# Patient Record
Sex: Female | Born: 1935
Health system: Southern US, Community
[De-identification: ages and names within clinical notes are randomized; demographics above are authoritative.]

## PROBLEM LIST (undated history)

## (undated) DIAGNOSIS — H3530 Unspecified macular degeneration: Secondary | ICD-10-CM

## (undated) DIAGNOSIS — Z7901 Long term (current) use of anticoagulants: Secondary | ICD-10-CM

## (undated) DIAGNOSIS — D759 Disease of blood and blood-forming organs, unspecified: Secondary | ICD-10-CM

## (undated) DIAGNOSIS — I340 Nonrheumatic mitral (valve) insufficiency: Secondary | ICD-10-CM

## (undated) DIAGNOSIS — R011 Cardiac murmur, unspecified: Secondary | ICD-10-CM

## (undated) DIAGNOSIS — E669 Obesity, unspecified: Secondary | ICD-10-CM

## (undated) DIAGNOSIS — M26629 Arthralgia of temporomandibular joint, unspecified side: Secondary | ICD-10-CM

## (undated) DIAGNOSIS — D689 Coagulation defect, unspecified: Secondary | ICD-10-CM

## (undated) DIAGNOSIS — I471 Supraventricular tachycardia: Secondary | ICD-10-CM

## (undated) DIAGNOSIS — Z86718 Personal history of other venous thrombosis and embolism: Secondary | ICD-10-CM

## (undated) DIAGNOSIS — I10 Essential (primary) hypertension: Secondary | ICD-10-CM

## (undated) DIAGNOSIS — Z85820 Personal history of malignant melanoma of skin: Secondary | ICD-10-CM

## (undated) DIAGNOSIS — E119 Type 2 diabetes mellitus without complications: Secondary | ICD-10-CM

## (undated) DIAGNOSIS — M1990 Unspecified osteoarthritis, unspecified site: Secondary | ICD-10-CM

## (undated) DIAGNOSIS — Z860100 Personal history of colon polyps, unspecified: Secondary | ICD-10-CM

## (undated) DIAGNOSIS — N201 Calculus of ureter: Secondary | ICD-10-CM

## (undated) DIAGNOSIS — A498 Other bacterial infections of unspecified site: Secondary | ICD-10-CM

## (undated) DIAGNOSIS — N200 Calculus of kidney: Secondary | ICD-10-CM

## (undated) DIAGNOSIS — I7121 Aneurysm of the ascending aorta, without rupture: Secondary | ICD-10-CM

## (undated) DIAGNOSIS — Z862 Personal history of diseases of the blood and blood-forming organs and certain disorders involving the immune mechanism: Secondary | ICD-10-CM

## (undated) DIAGNOSIS — E782 Mixed hyperlipidemia: Secondary | ICD-10-CM

## (undated) DIAGNOSIS — R739 Hyperglycemia, unspecified: Secondary | ICD-10-CM

## (undated) DIAGNOSIS — E1169 Type 2 diabetes mellitus with other specified complication: Secondary | ICD-10-CM

## (undated) DIAGNOSIS — F411 Generalized anxiety disorder: Secondary | ICD-10-CM

## (undated) DIAGNOSIS — M545 Low back pain: Secondary | ICD-10-CM

## (undated) DIAGNOSIS — Z8619 Personal history of other infectious and parasitic diseases: Secondary | ICD-10-CM

## (undated) DIAGNOSIS — Z9889 Other specified postprocedural states: Secondary | ICD-10-CM

## (undated) DIAGNOSIS — Z8679 Personal history of other diseases of the circulatory system: Secondary | ICD-10-CM

## (undated) DIAGNOSIS — C50919 Malignant neoplasm of unspecified site of unspecified female breast: Secondary | ICD-10-CM

## (undated) DIAGNOSIS — Z87442 Personal history of urinary calculi: Secondary | ICD-10-CM

## (undated) DIAGNOSIS — E785 Hyperlipidemia, unspecified: Secondary | ICD-10-CM

## (undated) DIAGNOSIS — R351 Nocturia: Secondary | ICD-10-CM

## (undated) DIAGNOSIS — I712 Thoracic aortic aneurysm, without rupture: Secondary | ICD-10-CM

## (undated) DIAGNOSIS — D682 Hereditary deficiency of other clotting factors: Secondary | ICD-10-CM

## (undated) DIAGNOSIS — C4441 Basal cell carcinoma of skin of scalp and neck: Secondary | ICD-10-CM

## (undated) DIAGNOSIS — Z85828 Personal history of other malignant neoplasm of skin: Secondary | ICD-10-CM

## (undated) DIAGNOSIS — I82409 Acute embolism and thrombosis of unspecified deep veins of unspecified lower extremity: Secondary | ICD-10-CM

## (undated) HISTORY — PX: BREAST LUMPECTOMY WITH RADIOACTIVE SEED AND SENTINEL LYMPH NODE BIOPSY: SHX6550

## (undated) HISTORY — PX: CYSTOSCOPY/URETEROSCOPY/HOLMIUM LASER/STENT PLACEMENT: SHX6546

## (undated) HISTORY — PX: MELANOMA EXCISION: SHX5266

## (undated) HISTORY — DX: Unspecified osteoarthritis, unspecified site: M19.90

## (undated) HISTORY — PX: LAPAROSCOPIC HYSTERECTOMY: SHX1926

## (undated) HISTORY — DX: Supraventricular tachycardia: I47.1

## (undated) HISTORY — DX: Hereditary deficiency of other clotting factors: D68.2

## (undated) HISTORY — DX: Essential (primary) hypertension: I10

## (undated) HISTORY — DX: Arthralgia of temporomandibular joint, unspecified side: M26.629

## (undated) HISTORY — DX: Coagulation defect, unspecified: D68.9

## (undated) HISTORY — DX: Malignant neoplasm of unspecified site of unspecified female breast: C50.919

## (undated) HISTORY — DX: Hyperglycemia, unspecified: R73.9

## (undated) HISTORY — PX: SPLENECTOMY: SUR1306

## (undated) HISTORY — DX: Other bacterial infections of unspecified site: A49.8

## (undated) HISTORY — DX: Nocturia: R35.1

## (undated) HISTORY — PX: JOINT REPLACEMENT: SHX530

## (undated) HISTORY — PX: COLONOSCOPY: SHX174

## (undated) HISTORY — DX: Basal cell carcinoma of skin of scalp and neck: C44.41

## (undated) HISTORY — DX: Personal history of colonic polyps: Z86.010

## (undated) HISTORY — DX: Nonrheumatic mitral (valve) insufficiency: I34.0

## (undated) HISTORY — PX: OTHER SURGICAL HISTORY: SHX169

## (undated) HISTORY — DX: Type 2 diabetes mellitus with other specified complication: E11.69

## (undated) HISTORY — DX: Acute embolism and thrombosis of unspecified deep veins of unspecified lower extremity: I82.409

## (undated) HISTORY — PX: UPPER GASTROINTESTINAL ENDOSCOPY: SHX188

## (undated) HISTORY — DX: Hyperlipidemia, unspecified: E78.5

## (undated) HISTORY — DX: Obesity, unspecified: E66.9

## (undated) HISTORY — DX: Mixed hyperlipidemia: E78.2

## (undated) HISTORY — PX: ABDOMINAL HYSTERECTOMY: SHX81

## (undated) HISTORY — PX: EYE SURGERY: SHX253

## (undated) HISTORY — DX: Personal history of colon polyps, unspecified: Z86.0100

## (undated) HISTORY — DX: Thoracic aortic aneurysm, without rupture: I71.2

## (undated) HISTORY — DX: Unspecified macular degeneration: H35.30

## (undated) HISTORY — DX: Generalized anxiety disorder: F41.1

## (undated) HISTORY — DX: Low back pain: M54.5

---
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
## 1971-01-31 HISTORY — PX: ABDOMINAL HYSTERECTOMY: SHX81

## 1996-01-31 DIAGNOSIS — D682 Hereditary deficiency of other clotting factors: Secondary | ICD-10-CM

## 1996-01-31 HISTORY — PX: SPLENECTOMY, TOTAL: SHX788

## 1996-01-31 HISTORY — DX: Hereditary deficiency of other clotting factors: D68.2

## 1997-01-30 DIAGNOSIS — Z8739 Personal history of other diseases of the musculoskeletal system and connective tissue: Secondary | ICD-10-CM

## 1997-01-30 HISTORY — PX: TOTAL HIP ARTHROPLASTY: SHX124

## 1997-01-30 HISTORY — DX: Personal history of other diseases of the musculoskeletal system and connective tissue: Z87.39

## 1997-06-17 ENCOUNTER — Inpatient Hospital Stay (HOSPITAL_COMMUNITY): Admission: RE | Admit: 1997-06-17 | Discharge: 1997-06-23 | Payer: Self-pay | Admitting: Orthopaedic Surgery

## 1997-06-23 ENCOUNTER — Inpatient Hospital Stay (HOSPITAL_COMMUNITY)
Admission: RE | Admit: 1997-06-23 | Discharge: 1997-06-26 | Payer: Self-pay | Admitting: Physical Medicine and Rehabilitation

## 1997-09-10 ENCOUNTER — Inpatient Hospital Stay (HOSPITAL_COMMUNITY): Admission: RE | Admit: 1997-09-10 | Discharge: 1997-09-11 | Payer: Self-pay | Admitting: Internal Medicine

## 1998-02-24 ENCOUNTER — Emergency Department (HOSPITAL_COMMUNITY): Admission: EM | Admit: 1998-02-24 | Discharge: 1998-02-24 | Payer: Self-pay | Admitting: Emergency Medicine

## 1998-02-25 ENCOUNTER — Encounter: Payer: Self-pay | Admitting: Emergency Medicine

## 1998-02-26 ENCOUNTER — Ambulatory Visit (HOSPITAL_COMMUNITY): Admission: RE | Admit: 1998-02-26 | Discharge: 1998-02-26 | Payer: Self-pay | Admitting: Internal Medicine

## 1998-02-26 ENCOUNTER — Encounter: Payer: Self-pay | Admitting: Internal Medicine

## 1998-05-14 ENCOUNTER — Encounter: Payer: Self-pay | Admitting: Gynecology

## 1998-05-14 ENCOUNTER — Ambulatory Visit (HOSPITAL_COMMUNITY): Admission: RE | Admit: 1998-05-14 | Discharge: 1998-05-14 | Payer: Self-pay | Admitting: Gynecology

## 1998-07-11 ENCOUNTER — Emergency Department (HOSPITAL_COMMUNITY): Admission: EM | Admit: 1998-07-11 | Discharge: 1998-07-11 | Payer: Self-pay | Admitting: Emergency Medicine

## 1998-12-09 ENCOUNTER — Encounter: Payer: Self-pay | Admitting: Obstetrics & Gynecology

## 1998-12-09 ENCOUNTER — Ambulatory Visit (HOSPITAL_COMMUNITY): Admission: RE | Admit: 1998-12-09 | Discharge: 1998-12-09 | Payer: Self-pay | Admitting: Obstetrics & Gynecology

## 1999-01-31 HISTORY — PX: CATARACT EXTRACTION W/ INTRAOCULAR LENS  IMPLANT, BILATERAL: SHX1307

## 1999-12-12 ENCOUNTER — Encounter: Payer: Self-pay | Admitting: Obstetrics & Gynecology

## 1999-12-12 ENCOUNTER — Ambulatory Visit (HOSPITAL_COMMUNITY): Admission: RE | Admit: 1999-12-12 | Discharge: 1999-12-12 | Payer: Self-pay | Admitting: Obstetrics & Gynecology

## 2000-07-11 ENCOUNTER — Other Ambulatory Visit: Admission: RE | Admit: 2000-07-11 | Discharge: 2000-07-11 | Payer: Self-pay | Admitting: Obstetrics & Gynecology

## 2000-12-12 ENCOUNTER — Encounter: Payer: Self-pay | Admitting: Obstetrics & Gynecology

## 2000-12-12 ENCOUNTER — Ambulatory Visit (HOSPITAL_COMMUNITY): Admission: RE | Admit: 2000-12-12 | Discharge: 2000-12-12 | Payer: Self-pay | Admitting: Obstetrics & Gynecology

## 2001-05-24 ENCOUNTER — Encounter (INDEPENDENT_AMBULATORY_CARE_PROVIDER_SITE_OTHER): Payer: Self-pay | Admitting: Gastroenterology

## 2001-12-09 ENCOUNTER — Ambulatory Visit (HOSPITAL_COMMUNITY): Admission: RE | Admit: 2001-12-09 | Discharge: 2001-12-09 | Payer: Self-pay | Admitting: Obstetrics & Gynecology

## 2001-12-09 ENCOUNTER — Encounter: Payer: Self-pay | Admitting: Obstetrics & Gynecology

## 2002-12-15 ENCOUNTER — Ambulatory Visit (HOSPITAL_COMMUNITY): Admission: RE | Admit: 2002-12-15 | Discharge: 2002-12-15 | Payer: Self-pay | Admitting: Obstetrics & Gynecology

## 2003-12-09 ENCOUNTER — Ambulatory Visit: Payer: Self-pay | Admitting: Internal Medicine

## 2003-12-16 ENCOUNTER — Ambulatory Visit (HOSPITAL_COMMUNITY): Admission: RE | Admit: 2003-12-16 | Discharge: 2003-12-16 | Payer: Self-pay | Admitting: Obstetrics & Gynecology

## 2004-02-05 ENCOUNTER — Ambulatory Visit: Payer: Self-pay | Admitting: Internal Medicine

## 2004-02-23 ENCOUNTER — Ambulatory Visit: Payer: Self-pay | Admitting: Internal Medicine

## 2004-04-26 ENCOUNTER — Ambulatory Visit: Payer: Self-pay | Admitting: Internal Medicine

## 2004-04-30 DIAGNOSIS — Z86711 Personal history of pulmonary embolism: Secondary | ICD-10-CM

## 2004-04-30 HISTORY — DX: Personal history of pulmonary embolism: Z86.711

## 2004-05-10 ENCOUNTER — Ambulatory Visit: Payer: Self-pay

## 2004-05-10 ENCOUNTER — Inpatient Hospital Stay (HOSPITAL_COMMUNITY): Admission: EM | Admit: 2004-05-10 | Discharge: 2004-05-13 | Payer: Self-pay | Admitting: Internal Medicine

## 2004-05-11 ENCOUNTER — Ambulatory Visit: Payer: Self-pay | Admitting: Internal Medicine

## 2004-05-16 ENCOUNTER — Ambulatory Visit: Payer: Self-pay | Admitting: Internal Medicine

## 2004-05-23 ENCOUNTER — Ambulatory Visit: Payer: Self-pay | Admitting: Cardiology

## 2004-05-30 ENCOUNTER — Ambulatory Visit: Payer: Self-pay | Admitting: Cardiology

## 2004-06-06 ENCOUNTER — Ambulatory Visit: Payer: Self-pay | Admitting: *Deleted

## 2004-06-08 ENCOUNTER — Ambulatory Visit: Payer: Self-pay | Admitting: Internal Medicine

## 2004-06-10 ENCOUNTER — Ambulatory Visit: Payer: Self-pay | Admitting: Internal Medicine

## 2004-06-20 ENCOUNTER — Ambulatory Visit: Payer: Self-pay | Admitting: Internal Medicine

## 2004-06-22 ENCOUNTER — Ambulatory Visit (HOSPITAL_COMMUNITY): Admission: RE | Admit: 2004-06-22 | Discharge: 2004-06-22 | Payer: Self-pay | Admitting: Internal Medicine

## 2004-06-22 ENCOUNTER — Ambulatory Visit: Payer: Self-pay | Admitting: Internal Medicine

## 2004-06-23 ENCOUNTER — Ambulatory Visit: Payer: Self-pay | Admitting: Internal Medicine

## 2004-06-23 ENCOUNTER — Ambulatory Visit: Payer: Self-pay | Admitting: Oncology

## 2004-06-28 ENCOUNTER — Ambulatory Visit: Payer: Self-pay | Admitting: Internal Medicine

## 2004-07-05 ENCOUNTER — Ambulatory Visit: Payer: Self-pay | Admitting: Internal Medicine

## 2004-07-18 ENCOUNTER — Ambulatory Visit: Payer: Self-pay | Admitting: Cardiology

## 2004-07-29 ENCOUNTER — Ambulatory Visit: Payer: Self-pay | Admitting: Internal Medicine

## 2004-08-03 ENCOUNTER — Ambulatory Visit: Payer: Self-pay | Admitting: Internal Medicine

## 2004-08-15 ENCOUNTER — Ambulatory Visit: Payer: Self-pay | Admitting: Cardiology

## 2004-08-23 ENCOUNTER — Ambulatory Visit: Payer: Self-pay | Admitting: Cardiology

## 2004-09-13 ENCOUNTER — Ambulatory Visit: Payer: Self-pay | Admitting: Cardiology

## 2004-09-15 ENCOUNTER — Ambulatory Visit: Payer: Self-pay | Admitting: Internal Medicine

## 2004-09-27 ENCOUNTER — Ambulatory Visit: Payer: Self-pay | Admitting: Cardiology

## 2004-10-04 ENCOUNTER — Ambulatory Visit: Payer: Self-pay | Admitting: Internal Medicine

## 2004-10-10 ENCOUNTER — Ambulatory Visit: Payer: Self-pay | Admitting: Internal Medicine

## 2004-10-18 ENCOUNTER — Ambulatory Visit: Payer: Self-pay | Admitting: Internal Medicine

## 2004-11-15 ENCOUNTER — Ambulatory Visit: Payer: Self-pay | Admitting: Cardiology

## 2004-11-17 ENCOUNTER — Ambulatory Visit: Payer: Self-pay | Admitting: Internal Medicine

## 2004-11-21 ENCOUNTER — Ambulatory Visit: Payer: Self-pay | Admitting: Internal Medicine

## 2004-12-06 ENCOUNTER — Ambulatory Visit: Payer: Self-pay | Admitting: Cardiology

## 2004-12-16 ENCOUNTER — Ambulatory Visit (HOSPITAL_COMMUNITY): Admission: RE | Admit: 2004-12-16 | Discharge: 2004-12-16 | Payer: Self-pay | Admitting: Obstetrics & Gynecology

## 2004-12-20 ENCOUNTER — Ambulatory Visit: Payer: Self-pay | Admitting: Cardiology

## 2004-12-29 ENCOUNTER — Ambulatory Visit: Payer: Self-pay | Admitting: Internal Medicine

## 2005-01-02 ENCOUNTER — Ambulatory Visit: Payer: Self-pay | Admitting: Internal Medicine

## 2005-01-17 ENCOUNTER — Ambulatory Visit: Payer: Self-pay | Admitting: Cardiology

## 2005-02-07 ENCOUNTER — Ambulatory Visit: Payer: Self-pay | Admitting: *Deleted

## 2005-02-21 ENCOUNTER — Ambulatory Visit: Payer: Self-pay | Admitting: Cardiology

## 2005-02-28 ENCOUNTER — Ambulatory Visit: Payer: Self-pay | Admitting: Internal Medicine

## 2005-02-28 ENCOUNTER — Ambulatory Visit: Payer: Self-pay | Admitting: Cardiology

## 2005-03-14 ENCOUNTER — Ambulatory Visit: Payer: Self-pay | Admitting: Cardiology

## 2005-03-25 ENCOUNTER — Ambulatory Visit: Payer: Self-pay | Admitting: Family Medicine

## 2005-03-28 ENCOUNTER — Ambulatory Visit: Payer: Self-pay | Admitting: Internal Medicine

## 2005-04-04 ENCOUNTER — Ambulatory Visit: Payer: Self-pay | Admitting: *Deleted

## 2005-04-04 ENCOUNTER — Ambulatory Visit: Payer: Self-pay | Admitting: Internal Medicine

## 2005-04-18 ENCOUNTER — Ambulatory Visit: Payer: Self-pay | Admitting: Internal Medicine

## 2005-05-18 ENCOUNTER — Ambulatory Visit: Payer: Self-pay | Admitting: Cardiology

## 2005-06-15 ENCOUNTER — Ambulatory Visit: Payer: Self-pay | Admitting: Cardiology

## 2005-07-04 ENCOUNTER — Ambulatory Visit: Payer: Self-pay | Admitting: Internal Medicine

## 2005-07-14 ENCOUNTER — Ambulatory Visit: Payer: Self-pay | Admitting: Cardiology

## 2005-07-24 ENCOUNTER — Ambulatory Visit: Payer: Self-pay | Admitting: Internal Medicine

## 2005-07-25 ENCOUNTER — Ambulatory Visit: Payer: Self-pay | Admitting: Cardiovascular Disease

## 2005-08-15 ENCOUNTER — Ambulatory Visit: Payer: Self-pay | Admitting: Cardiovascular Disease

## 2005-08-23 ENCOUNTER — Ambulatory Visit: Payer: Self-pay | Admitting: Internal Medicine

## 2005-08-31 ENCOUNTER — Ambulatory Visit: Payer: Self-pay | Admitting: Cardiology

## 2005-09-19 ENCOUNTER — Ambulatory Visit: Payer: Self-pay | Admitting: Internal Medicine

## 2005-09-28 ENCOUNTER — Ambulatory Visit: Payer: Self-pay | Admitting: Cardiology

## 2005-10-11 ENCOUNTER — Ambulatory Visit: Payer: Self-pay | Admitting: Cardiovascular Disease

## 2005-10-25 ENCOUNTER — Ambulatory Visit: Payer: Self-pay | Admitting: Internal Medicine

## 2005-10-25 ENCOUNTER — Ambulatory Visit: Payer: Self-pay | Admitting: Cardiology

## 2005-11-08 ENCOUNTER — Ambulatory Visit: Payer: Self-pay | Admitting: Cardiology

## 2005-11-08 ENCOUNTER — Ambulatory Visit: Payer: Self-pay | Admitting: Internal Medicine

## 2005-11-29 ENCOUNTER — Ambulatory Visit: Payer: Self-pay | Admitting: Cardiology

## 2005-12-11 ENCOUNTER — Ambulatory Visit: Payer: Self-pay | Admitting: Internal Medicine

## 2005-12-13 ENCOUNTER — Ambulatory Visit: Payer: Self-pay | Admitting: Cardiology

## 2005-12-25 ENCOUNTER — Ambulatory Visit (HOSPITAL_COMMUNITY): Admission: RE | Admit: 2005-12-25 | Discharge: 2005-12-25 | Payer: Self-pay | Admitting: Obstetrics & Gynecology

## 2005-12-26 ENCOUNTER — Ambulatory Visit: Payer: Self-pay | Admitting: Cardiology

## 2006-01-12 ENCOUNTER — Ambulatory Visit: Payer: Self-pay | Admitting: Cardiology

## 2006-01-31 ENCOUNTER — Ambulatory Visit: Payer: Self-pay | Admitting: *Deleted

## 2006-02-28 ENCOUNTER — Ambulatory Visit: Payer: Self-pay | Admitting: Internal Medicine

## 2006-03-20 ENCOUNTER — Ambulatory Visit: Payer: Self-pay | Admitting: Cardiology

## 2006-04-17 ENCOUNTER — Ambulatory Visit: Payer: Self-pay | Admitting: Cardiology

## 2006-05-03 ENCOUNTER — Ambulatory Visit: Payer: Self-pay | Admitting: Internal Medicine

## 2006-05-03 LAB — CONVERTED CEMR LAB
ALT: 15 units/L (ref 0–40)
AST: 22 units/L (ref 0–37)
Albumin: 3.8 g/dL (ref 3.5–5.2)
Alkaline Phosphatase: 40 units/L (ref 39–117)
Bilirubin, Direct: 0.1 mg/dL (ref 0.0–0.3)
Cholesterol: 165 mg/dL (ref 0–200)
HDL: 45.8 mg/dL (ref >39.0)
LDL Cholesterol: 100 mg/dL — ABNORMAL HIGH (ref 0–99)
Total Bilirubin: 0.7 mg/dL (ref 0.3–1.2)
Total CHOL/HDL Ratio: 3.6
Total Protein: 7.3 g/dL (ref 6.0–8.3)
Triglycerides: 96 mg/dL (ref 0–149)
VLDL: 19 mg/dL (ref 0–40)

## 2006-05-07 ENCOUNTER — Ambulatory Visit: Payer: Self-pay | Admitting: Internal Medicine

## 2006-05-15 ENCOUNTER — Ambulatory Visit: Payer: Self-pay | Admitting: Cardiovascular Disease

## 2006-06-12 ENCOUNTER — Ambulatory Visit: Payer: Self-pay | Admitting: Cardiology

## 2006-07-10 ENCOUNTER — Ambulatory Visit: Payer: Self-pay | Admitting: Cardiology

## 2006-07-23 ENCOUNTER — Ambulatory Visit: Payer: Self-pay | Admitting: Cardiovascular Disease

## 2006-08-11 DIAGNOSIS — Z86718 Personal history of other venous thrombosis and embolism: Secondary | ICD-10-CM | POA: Insufficient documentation

## 2006-08-20 ENCOUNTER — Ambulatory Visit: Payer: Self-pay | Admitting: Cardiology

## 2006-09-04 ENCOUNTER — Ambulatory Visit: Payer: Self-pay | Admitting: Cardiology

## 2006-09-21 ENCOUNTER — Ambulatory Visit: Payer: Self-pay | Admitting: Cardiovascular Disease

## 2006-10-05 ENCOUNTER — Ambulatory Visit: Payer: Self-pay | Admitting: Internal Medicine

## 2006-10-19 ENCOUNTER — Ambulatory Visit: Payer: Self-pay | Admitting: Cardiology

## 2006-11-09 ENCOUNTER — Ambulatory Visit: Payer: Self-pay | Admitting: Cardiology

## 2006-11-21 ENCOUNTER — Ambulatory Visit: Payer: Self-pay | Admitting: Cardiology

## 2006-11-23 ENCOUNTER — Encounter: Payer: Self-pay | Admitting: Internal Medicine

## 2006-11-23 ENCOUNTER — Ambulatory Visit: Payer: Self-pay | Admitting: Internal Medicine

## 2006-11-25 DIAGNOSIS — I712 Thoracic aortic aneurysm, without rupture, unspecified: Secondary | ICD-10-CM | POA: Insufficient documentation

## 2006-12-05 ENCOUNTER — Ambulatory Visit: Payer: Self-pay | Admitting: Cardiology

## 2006-12-15 ENCOUNTER — Ambulatory Visit: Payer: Self-pay | Admitting: Family Medicine

## 2006-12-19 ENCOUNTER — Ambulatory Visit: Payer: Self-pay | Admitting: Cardiology

## 2006-12-31 ENCOUNTER — Ambulatory Visit (HOSPITAL_COMMUNITY): Admission: RE | Admit: 2006-12-31 | Discharge: 2006-12-31 | Payer: Self-pay | Admitting: Internal Medicine

## 2006-12-31 ENCOUNTER — Ambulatory Visit: Payer: Self-pay | Admitting: Internal Medicine

## 2007-01-03 ENCOUNTER — Encounter: Payer: Self-pay | Admitting: Internal Medicine

## 2007-01-03 ENCOUNTER — Encounter: Admission: RE | Admit: 2007-01-03 | Discharge: 2007-01-03 | Payer: Self-pay | Admitting: Internal Medicine

## 2007-01-21 ENCOUNTER — Ambulatory Visit: Payer: Self-pay | Admitting: Cardiology

## 2007-02-18 ENCOUNTER — Ambulatory Visit: Payer: Self-pay | Admitting: Cardiology

## 2007-03-04 ENCOUNTER — Ambulatory Visit: Payer: Self-pay | Admitting: Internal Medicine

## 2007-03-28 ENCOUNTER — Telehealth: Payer: Self-pay | Admitting: Internal Medicine

## 2007-03-28 ENCOUNTER — Ambulatory Visit: Payer: Self-pay | Admitting: Internal Medicine

## 2007-03-28 DIAGNOSIS — I471 Supraventricular tachycardia, unspecified: Secondary | ICD-10-CM | POA: Insufficient documentation

## 2007-04-02 ENCOUNTER — Ambulatory Visit: Payer: Self-pay | Admitting: Internal Medicine

## 2007-04-04 ENCOUNTER — Telehealth: Payer: Self-pay | Admitting: Internal Medicine

## 2007-04-04 ENCOUNTER — Ambulatory Visit: Payer: Self-pay | Admitting: Cardiovascular Disease

## 2007-04-05 ENCOUNTER — Ambulatory Visit: Payer: Self-pay | Admitting: Internal Medicine

## 2007-04-05 LAB — CONVERTED CEMR LAB
BUN: 30 mg/dL — ABNORMAL HIGH (ref 6–23)
Creatinine, Ser: 0.9 mg/dL (ref 0.4–1.2)

## 2007-04-08 ENCOUNTER — Encounter: Admission: RE | Admit: 2007-04-08 | Discharge: 2007-04-08 | Payer: Self-pay | Admitting: Internal Medicine

## 2007-04-14 ENCOUNTER — Encounter: Payer: Self-pay | Admitting: Internal Medicine

## 2007-04-18 ENCOUNTER — Ambulatory Visit: Payer: Self-pay | Admitting: Cardiology

## 2007-04-23 ENCOUNTER — Encounter: Payer: Self-pay | Admitting: Internal Medicine

## 2007-04-25 ENCOUNTER — Telehealth: Payer: Self-pay | Admitting: Internal Medicine

## 2007-04-30 ENCOUNTER — Telehealth: Payer: Self-pay | Admitting: Internal Medicine

## 2007-05-02 ENCOUNTER — Ambulatory Visit: Payer: Self-pay | Admitting: Internal Medicine

## 2007-05-16 ENCOUNTER — Ambulatory Visit: Payer: Self-pay | Admitting: Cardiology

## 2007-06-04 ENCOUNTER — Ambulatory Visit: Payer: Self-pay | Admitting: Internal Medicine

## 2007-06-04 ENCOUNTER — Ambulatory Visit: Payer: Self-pay | Admitting: Cardiology

## 2007-06-04 DIAGNOSIS — E785 Hyperlipidemia, unspecified: Secondary | ICD-10-CM | POA: Insufficient documentation

## 2007-06-04 LAB — CONVERTED CEMR LAB
ALT: 16 units/L (ref 0–35)
AST: 25 units/L (ref 0–37)
Albumin: 3.9 g/dL (ref 3.5–5.2)
Alkaline Phosphatase: 47 units/L (ref 39–117)
BUN: 21 mg/dL (ref 6–23)
Bilirubin, Direct: 0.1 mg/dL (ref 0.0–0.3)
CO2: 31 meq/L (ref 19–32)
Calcium: 9.6 mg/dL (ref 8.4–10.5)
Chloride: 106 meq/L (ref 96–112)
Cholesterol: 177 mg/dL (ref 0–200)
Creatinine, Ser: 0.9 mg/dL (ref 0.4–1.2)
GFR calc Af Amer: 79 mL/min
GFR calc non Af Amer: 66 mL/min
Glucose, Bld: 116 mg/dL — ABNORMAL HIGH (ref 70–99)
HDL: 43.1 mg/dL (ref >39.0)
LDL Cholesterol: 110 mg/dL — ABNORMAL HIGH (ref 0–99)
Potassium: 4.5 meq/L (ref 3.5–5.1)
Sodium: 143 meq/L (ref 135–145)
TSH: 3.27 microintl units/mL (ref 0.35–5.50)
Total Bilirubin: 0.8 mg/dL (ref 0.3–1.2)
Total CHOL/HDL Ratio: 4.1
Total Protein: 7.7 g/dL (ref 6.0–8.3)
Triglycerides: 120 mg/dL (ref 0–149)
VLDL: 24 mg/dL (ref 0–40)

## 2007-06-07 ENCOUNTER — Encounter: Payer: Self-pay | Admitting: Internal Medicine

## 2007-06-25 ENCOUNTER — Ambulatory Visit: Payer: Self-pay | Admitting: Cardiology

## 2007-07-02 ENCOUNTER — Ambulatory Visit: Payer: Self-pay | Admitting: Internal Medicine

## 2007-07-02 DIAGNOSIS — I872 Venous insufficiency (chronic) (peripheral): Secondary | ICD-10-CM | POA: Insufficient documentation

## 2007-07-17 ENCOUNTER — Ambulatory Visit: Payer: Self-pay | Admitting: Internal Medicine

## 2007-07-29 ENCOUNTER — Telehealth: Payer: Self-pay | Admitting: Internal Medicine

## 2007-07-30 ENCOUNTER — Ambulatory Visit: Payer: Self-pay | Admitting: Internal Medicine

## 2007-08-14 ENCOUNTER — Ambulatory Visit: Payer: Self-pay | Admitting: Cardiology

## 2007-09-04 ENCOUNTER — Ambulatory Visit: Payer: Self-pay | Admitting: Cardiology

## 2007-09-16 ENCOUNTER — Telehealth: Payer: Self-pay | Admitting: Internal Medicine

## 2007-09-18 ENCOUNTER — Ambulatory Visit: Payer: Self-pay | Admitting: Internal Medicine

## 2007-09-18 DIAGNOSIS — I10 Essential (primary) hypertension: Secondary | ICD-10-CM | POA: Insufficient documentation

## 2007-09-25 ENCOUNTER — Ambulatory Visit: Payer: Self-pay | Admitting: Internal Medicine

## 2007-09-25 DIAGNOSIS — M26629 Arthralgia of temporomandibular joint, unspecified side: Secondary | ICD-10-CM | POA: Insufficient documentation

## 2007-10-02 ENCOUNTER — Ambulatory Visit: Payer: Self-pay | Admitting: Internal Medicine

## 2007-10-28 ENCOUNTER — Ambulatory Visit: Payer: Self-pay | Admitting: Cardiovascular Disease

## 2007-10-28 ENCOUNTER — Ambulatory Visit: Payer: Self-pay | Admitting: Internal Medicine

## 2007-11-12 ENCOUNTER — Telehealth: Payer: Self-pay | Admitting: Gastroenterology

## 2007-11-19 ENCOUNTER — Telehealth: Payer: Self-pay | Admitting: Internal Medicine

## 2007-11-22 ENCOUNTER — Ambulatory Visit: Payer: Self-pay | Admitting: Internal Medicine

## 2007-11-25 ENCOUNTER — Ambulatory Visit: Payer: Self-pay | Admitting: Cardiology

## 2007-11-27 ENCOUNTER — Ambulatory Visit: Payer: Self-pay | Admitting: Family Medicine

## 2007-11-27 ENCOUNTER — Encounter: Payer: Self-pay | Admitting: Internal Medicine

## 2007-12-09 DIAGNOSIS — K649 Unspecified hemorrhoids: Secondary | ICD-10-CM | POA: Insufficient documentation

## 2007-12-09 DIAGNOSIS — Z860100 Personal history of colon polyps, unspecified: Secondary | ICD-10-CM | POA: Insufficient documentation

## 2007-12-10 ENCOUNTER — Ambulatory Visit: Payer: Self-pay | Admitting: Gastroenterology

## 2007-12-11 ENCOUNTER — Ambulatory Visit: Payer: Self-pay | Admitting: Gastroenterology

## 2007-12-11 ENCOUNTER — Ambulatory Visit: Payer: Self-pay | Admitting: Internal Medicine

## 2007-12-11 ENCOUNTER — Telehealth: Payer: Self-pay | Admitting: Gastroenterology

## 2007-12-11 ENCOUNTER — Ambulatory Visit: Payer: Self-pay

## 2007-12-11 DIAGNOSIS — I8000 Phlebitis and thrombophlebitis of superficial vessels of unspecified lower extremity: Secondary | ICD-10-CM | POA: Insufficient documentation

## 2007-12-11 DIAGNOSIS — I8002 Phlebitis and thrombophlebitis of superficial vessels of left lower extremity: Secondary | ICD-10-CM | POA: Insufficient documentation

## 2007-12-11 LAB — CONVERTED CEMR LAB: Fecal Occult Bld: POSITIVE — AB

## 2007-12-16 ENCOUNTER — Telehealth: Payer: Self-pay | Admitting: Gastroenterology

## 2007-12-18 ENCOUNTER — Ambulatory Visit: Payer: Self-pay | Admitting: Gastroenterology

## 2007-12-23 ENCOUNTER — Ambulatory Visit: Payer: Self-pay | Admitting: Internal Medicine

## 2007-12-25 ENCOUNTER — Ambulatory Visit: Payer: Self-pay | Admitting: Gastroenterology

## 2007-12-30 ENCOUNTER — Ambulatory Visit: Payer: Self-pay | Admitting: Cardiovascular Disease

## 2008-01-07 ENCOUNTER — Ambulatory Visit: Payer: Self-pay | Admitting: Cardiology

## 2008-01-08 ENCOUNTER — Ambulatory Visit: Payer: Self-pay | Admitting: Gastroenterology

## 2008-01-08 DIAGNOSIS — K2900 Acute gastritis without bleeding: Secondary | ICD-10-CM | POA: Insufficient documentation

## 2008-01-08 LAB — CONVERTED CEMR LAB: UREASE: NEGATIVE

## 2008-01-17 ENCOUNTER — Ambulatory Visit: Payer: Self-pay | Admitting: Cardiovascular Disease

## 2008-01-30 ENCOUNTER — Ambulatory Visit: Payer: Self-pay | Admitting: Internal Medicine

## 2008-01-31 DIAGNOSIS — Z853 Personal history of malignant neoplasm of breast: Secondary | ICD-10-CM

## 2008-01-31 DIAGNOSIS — C50919 Malignant neoplasm of unspecified site of unspecified female breast: Secondary | ICD-10-CM

## 2008-01-31 DIAGNOSIS — Z923 Personal history of irradiation: Secondary | ICD-10-CM

## 2008-01-31 HISTORY — DX: Personal history of irradiation: Z92.3

## 2008-01-31 HISTORY — PX: BREAST LUMPECTOMY: SHX2

## 2008-01-31 HISTORY — DX: Malignant neoplasm of unspecified site of unspecified female breast: C50.919

## 2008-01-31 HISTORY — DX: Personal history of malignant neoplasm of breast: Z85.3

## 2008-02-04 ENCOUNTER — Ambulatory Visit: Payer: Self-pay | Admitting: Gastroenterology

## 2008-02-04 LAB — CONVERTED CEMR LAB
Ferritin: 34.1 ng/mL (ref 10.0–291.0)
Folate: 15.9 ng/mL
Iron: 81 ug/dL (ref 42–145)
Saturation Ratios: 23.4 % (ref 20.0–50.0)
Transferrin: 246.9 mg/dL (ref >=212.0)
Vitamin B-12: 1114 pg/mL — ABNORMAL HIGH (ref 211–911)

## 2008-02-11 ENCOUNTER — Telehealth: Payer: Self-pay | Admitting: Gastroenterology

## 2008-02-13 ENCOUNTER — Ambulatory Visit: Payer: Self-pay | Admitting: Cardiovascular Disease

## 2008-02-14 ENCOUNTER — Ambulatory Visit: Payer: Self-pay | Admitting: Gastroenterology

## 2008-02-14 LAB — CONVERTED CEMR LAB
Fecal Occult Blood: NEGATIVE
OCCULT 1: NEGATIVE
OCCULT 2: NEGATIVE
OCCULT 3: NEGATIVE
OCCULT 4: NEGATIVE
OCCULT 5: NEGATIVE

## 2008-03-03 ENCOUNTER — Ambulatory Visit: Payer: Self-pay | Admitting: Cardiology

## 2008-03-25 ENCOUNTER — Ambulatory Visit: Payer: Self-pay | Admitting: Cardiology

## 2008-04-01 ENCOUNTER — Encounter: Payer: Self-pay | Admitting: Internal Medicine

## 2008-04-01 ENCOUNTER — Ambulatory Visit: Payer: Self-pay | Admitting: Internal Medicine

## 2008-04-01 ENCOUNTER — Ambulatory Visit: Payer: Self-pay

## 2008-04-28 ENCOUNTER — Ambulatory Visit: Payer: Self-pay | Admitting: Cardiology

## 2008-05-19 ENCOUNTER — Ambulatory Visit: Payer: Self-pay | Admitting: Cardiology

## 2008-06-09 ENCOUNTER — Ambulatory Visit: Payer: Self-pay | Admitting: Cardiology

## 2008-06-23 ENCOUNTER — Ambulatory Visit: Payer: Self-pay | Admitting: Cardiology

## 2008-06-30 ENCOUNTER — Encounter: Payer: Self-pay | Admitting: *Deleted

## 2008-07-08 ENCOUNTER — Encounter (INDEPENDENT_AMBULATORY_CARE_PROVIDER_SITE_OTHER): Payer: Self-pay | Admitting: Diagnostic Radiology

## 2008-07-08 ENCOUNTER — Encounter: Admission: RE | Admit: 2008-07-08 | Discharge: 2008-07-08 | Payer: Self-pay | Admitting: Obstetrics & Gynecology

## 2008-07-13 ENCOUNTER — Encounter: Payer: Self-pay | Admitting: Cardiology

## 2008-07-14 ENCOUNTER — Ambulatory Visit: Payer: Self-pay | Admitting: Internal Medicine

## 2008-07-14 LAB — CONVERTED CEMR LAB
POC INR: 3.6
Protime: 23.1

## 2008-07-17 ENCOUNTER — Encounter: Admission: RE | Admit: 2008-07-17 | Discharge: 2008-07-17 | Payer: Self-pay | Admitting: Obstetrics & Gynecology

## 2008-07-27 ENCOUNTER — Encounter (INDEPENDENT_AMBULATORY_CARE_PROVIDER_SITE_OTHER): Payer: Self-pay | Admitting: Cardiology

## 2008-07-27 ENCOUNTER — Ambulatory Visit: Payer: Self-pay | Admitting: Cardiology

## 2008-07-27 LAB — CONVERTED CEMR LAB
POC INR: 1.6
Prothrombin Time: 15.4 s

## 2008-07-30 ENCOUNTER — Encounter (INDEPENDENT_AMBULATORY_CARE_PROVIDER_SITE_OTHER): Payer: Self-pay | Admitting: Surgery

## 2008-07-30 ENCOUNTER — Inpatient Hospital Stay (HOSPITAL_COMMUNITY): Admission: RE | Admit: 2008-07-30 | Discharge: 2008-07-31 | Payer: Self-pay | Admitting: Surgery

## 2008-07-30 ENCOUNTER — Encounter: Admission: RE | Admit: 2008-07-30 | Discharge: 2008-07-30 | Payer: Self-pay | Admitting: Surgery

## 2008-07-31 ENCOUNTER — Encounter (INDEPENDENT_AMBULATORY_CARE_PROVIDER_SITE_OTHER): Payer: Self-pay | Admitting: *Deleted

## 2008-07-31 ENCOUNTER — Encounter: Payer: Self-pay | Admitting: Cardiovascular Disease

## 2008-07-31 ENCOUNTER — Encounter (INDEPENDENT_AMBULATORY_CARE_PROVIDER_SITE_OTHER): Payer: Self-pay | Admitting: Cardiology

## 2008-08-04 ENCOUNTER — Ambulatory Visit: Payer: Self-pay | Admitting: Cardiology

## 2008-08-04 LAB — CONVERTED CEMR LAB
POC INR: 1.4
Prothrombin Time: 14.1 s

## 2008-08-05 ENCOUNTER — Encounter: Payer: Self-pay | Admitting: *Deleted

## 2008-08-07 ENCOUNTER — Encounter: Payer: Self-pay | Admitting: Internal Medicine

## 2008-08-10 ENCOUNTER — Encounter (INDEPENDENT_AMBULATORY_CARE_PROVIDER_SITE_OTHER): Payer: Self-pay | Admitting: Cardiology

## 2008-08-10 ENCOUNTER — Ambulatory Visit: Payer: Self-pay | Admitting: Cardiology

## 2008-08-10 ENCOUNTER — Ambulatory Visit: Payer: Self-pay | Admitting: Oncology

## 2008-08-10 HISTORY — PX: BREAST LUMPECTOMY WITH RADIOACTIVE SEED AND SENTINEL LYMPH NODE BIOPSY: SHX6550

## 2008-08-10 LAB — CONVERTED CEMR LAB
POC INR: 2.4
Prothrombin Time: 18.7 s

## 2008-08-12 ENCOUNTER — Encounter: Payer: Self-pay | Admitting: Internal Medicine

## 2008-08-12 LAB — CBC WITH DIFFERENTIAL/PLATELET
BASO%: 1 % (ref 0.0–2.0)
Basophils Absolute: 0.1 10*3/uL (ref 0.0–0.1)
EOS%: 3.9 % (ref 0.0–7.0)
Eosinophils Absolute: 0.3 10*3/uL (ref 0.0–0.5)
HCT: 37.5 % (ref 34.8–46.6)
HGB: 12.7 g/dL (ref 11.6–15.9)
LYMPH%: 31.9 % (ref 14.0–49.7)
MCH: 31.8 pg (ref 25.1–34.0)
MCHC: 33.9 g/dL (ref 31.5–36.0)
MCV: 93.7 fL (ref 79.5–101.0)
MONO#: 1 10*3/uL — ABNORMAL HIGH (ref 0.1–0.9)
MONO%: 15.5 % — ABNORMAL HIGH (ref 0.0–14.0)
NEUT#: 3.2 10*3/uL (ref 1.5–6.5)
NEUT%: 47.7 % (ref 38.4–76.8)
Platelets: 340 10*3/uL (ref 145–400)
RBC: 4 10*6/uL (ref 3.70–5.45)
RDW: 14.1 % (ref 11.2–14.5)
WBC: 6.7 10*3/uL (ref 3.9–10.3)
lymph#: 2.1 10*3/uL (ref 0.9–3.3)

## 2008-08-13 LAB — LACTATE DEHYDROGENASE: LDH: 133 U/L (ref 94–250)

## 2008-08-13 LAB — VITAMIN D 25 HYDROXY (VIT D DEFICIENCY, FRACTURES): Vit D, 25-Hydroxy: 30 ng/mL (ref 30–89)

## 2008-08-13 LAB — CEA: CEA: 1.2 ng/mL (ref 0.0–5.0)

## 2008-08-13 LAB — CANCER ANTIGEN 27.29: CA 27.29: 18 U/mL (ref 0–39)

## 2008-08-17 ENCOUNTER — Ambulatory Visit: Admission: RE | Admit: 2008-08-17 | Discharge: 2008-10-09 | Payer: Self-pay | Admitting: Radiation Oncology

## 2008-08-21 ENCOUNTER — Encounter: Payer: Self-pay | Admitting: Internal Medicine

## 2008-09-07 ENCOUNTER — Ambulatory Visit: Payer: Self-pay | Admitting: Internal Medicine

## 2008-09-07 LAB — CONVERTED CEMR LAB
POC INR: 4.7
Prothrombin Time: 26.4 s

## 2008-09-10 ENCOUNTER — Encounter: Payer: Self-pay | Admitting: Internal Medicine

## 2008-09-21 ENCOUNTER — Ambulatory Visit: Payer: Self-pay | Admitting: Cardiology

## 2008-09-21 LAB — CONVERTED CEMR LAB: POC INR: 3.7

## 2008-10-08 ENCOUNTER — Encounter: Payer: Self-pay | Admitting: Internal Medicine

## 2008-10-13 ENCOUNTER — Ambulatory Visit: Payer: Self-pay | Admitting: Cardiology

## 2008-10-13 LAB — CONVERTED CEMR LAB: POC INR: 2.5

## 2008-10-26 ENCOUNTER — Encounter: Payer: Self-pay | Admitting: Internal Medicine

## 2008-10-26 ENCOUNTER — Telehealth: Payer: Self-pay | Admitting: Internal Medicine

## 2008-11-03 ENCOUNTER — Ambulatory Visit: Payer: Self-pay | Admitting: Oncology

## 2008-11-05 ENCOUNTER — Encounter: Payer: Self-pay | Admitting: Gastroenterology

## 2008-11-10 ENCOUNTER — Ambulatory Visit: Payer: Self-pay | Admitting: Cardiology

## 2008-11-10 LAB — CONVERTED CEMR LAB: POC INR: 2.3

## 2008-11-23 ENCOUNTER — Ambulatory Visit: Payer: Self-pay | Admitting: Internal Medicine

## 2008-11-25 ENCOUNTER — Telehealth: Payer: Self-pay | Admitting: Internal Medicine

## 2008-12-08 ENCOUNTER — Ambulatory Visit: Payer: Self-pay | Admitting: Cardiology

## 2008-12-08 LAB — CONVERTED CEMR LAB: POC INR: 2.3

## 2008-12-11 ENCOUNTER — Telehealth: Payer: Self-pay | Admitting: Internal Medicine

## 2008-12-17 ENCOUNTER — Telehealth: Payer: Self-pay | Admitting: Internal Medicine

## 2008-12-31 ENCOUNTER — Ambulatory Visit: Payer: Self-pay | Admitting: Oncology

## 2008-12-31 ENCOUNTER — Telehealth: Payer: Self-pay | Admitting: Internal Medicine

## 2008-12-31 ENCOUNTER — Encounter: Payer: Self-pay | Admitting: Internal Medicine

## 2009-01-04 ENCOUNTER — Ambulatory Visit: Payer: Self-pay | Admitting: Cardiology

## 2009-01-04 ENCOUNTER — Encounter: Payer: Self-pay | Admitting: Internal Medicine

## 2009-01-04 LAB — CONVERTED CEMR LAB: POC INR: 2.3

## 2009-02-01 ENCOUNTER — Encounter (INDEPENDENT_AMBULATORY_CARE_PROVIDER_SITE_OTHER): Payer: Self-pay | Admitting: Cardiology

## 2009-02-01 ENCOUNTER — Ambulatory Visit: Payer: Self-pay | Admitting: Cardiology

## 2009-02-01 LAB — CONVERTED CEMR LAB: POC INR: 2.5

## 2009-02-03 ENCOUNTER — Encounter: Payer: Self-pay | Admitting: Internal Medicine

## 2009-03-01 ENCOUNTER — Ambulatory Visit: Payer: Self-pay | Admitting: Cardiology

## 2009-03-01 LAB — CONVERTED CEMR LAB: POC INR: 2.3

## 2009-03-29 ENCOUNTER — Ambulatory Visit: Payer: Self-pay | Admitting: Cardiology

## 2009-03-29 LAB — CONVERTED CEMR LAB: POC INR: 1.9

## 2009-04-09 ENCOUNTER — Telehealth: Payer: Self-pay | Admitting: Internal Medicine

## 2009-04-26 ENCOUNTER — Ambulatory Visit: Payer: Self-pay | Admitting: Cardiovascular Disease

## 2009-04-26 ENCOUNTER — Encounter: Payer: Self-pay | Admitting: Internal Medicine

## 2009-04-26 LAB — CONVERTED CEMR LAB: POC INR: 1.9

## 2009-05-28 ENCOUNTER — Ambulatory Visit: Payer: Self-pay | Admitting: Cardiology

## 2009-05-28 LAB — CONVERTED CEMR LAB: POC INR: 2.5

## 2009-06-21 ENCOUNTER — Ambulatory Visit: Payer: Self-pay | Admitting: Cardiology

## 2009-06-21 LAB — CONVERTED CEMR LAB: POC INR: 2.3

## 2009-06-30 ENCOUNTER — Ambulatory Visit: Payer: Self-pay | Admitting: Oncology

## 2009-07-01 ENCOUNTER — Encounter: Payer: Self-pay | Admitting: Internal Medicine

## 2009-07-01 LAB — LACTATE DEHYDROGENASE: LDH: 164 U/L (ref 94–250)

## 2009-07-01 LAB — COMPREHENSIVE METABOLIC PANEL
ALT: 13 U/L (ref 0–35)
AST: 19 U/L (ref 0–37)
Albumin: 4.2 g/dL (ref 3.5–5.2)
Alkaline Phosphatase: 53 U/L (ref 39–117)
BUN: 27 mg/dL — ABNORMAL HIGH (ref 6–23)
CO2: 23 mEq/L (ref 19–32)
Calcium: 9.3 mg/dL (ref 8.4–10.5)
Chloride: 104 mEq/L (ref 96–112)
Creatinine, Ser: 0.9 mg/dL (ref 0.40–1.20)
Glucose, Bld: 115 mg/dL — ABNORMAL HIGH (ref 70–99)
Potassium: 3.8 mEq/L (ref 3.5–5.3)
Sodium: 138 mEq/L (ref 135–145)
Total Bilirubin: 0.6 mg/dL (ref 0.3–1.2)
Total Protein: 7.3 g/dL (ref 6.0–8.3)

## 2009-07-01 LAB — CBC WITH DIFFERENTIAL/PLATELET
BASO%: 0.7 % (ref 0.0–2.0)
Basophils Absolute: 0 10*3/uL (ref 0.0–0.1)
EOS%: 1.8 % (ref 0.0–7.0)
Eosinophils Absolute: 0.1 10*3/uL (ref 0.0–0.5)
HCT: 37.9 % (ref 34.8–46.6)
HGB: 12.8 g/dL (ref 11.6–15.9)
LYMPH%: 48.5 % (ref 14.0–49.7)
MCH: 31.7 pg (ref 25.1–34.0)
MCHC: 33.7 g/dL (ref 31.5–36.0)
MCV: 94 fL (ref 79.5–101.0)
MONO#: 0.9 10*3/uL (ref 0.1–0.9)
MONO%: 12.9 % (ref 0.0–14.0)
NEUT#: 2.5 10*3/uL (ref 1.5–6.5)
NEUT%: 36.1 % — ABNORMAL LOW (ref 38.4–76.8)
Platelets: 277 10*3/uL (ref 145–400)
RBC: 4.04 10*6/uL (ref 3.70–5.45)
RDW: 14 % (ref 11.2–14.5)
WBC: 6.8 10*3/uL (ref 3.9–10.3)
lymph#: 3.3 10*3/uL (ref 0.9–3.3)

## 2009-07-01 LAB — CANCER ANTIGEN 27.29: CA 27.29: 17 U/mL (ref 0–39)

## 2009-07-01 LAB — VITAMIN D 25 HYDROXY (VIT D DEFICIENCY, FRACTURES): Vit D, 25-Hydroxy: 25 ng/mL — ABNORMAL LOW (ref 30–89)

## 2009-07-07 ENCOUNTER — Encounter: Payer: Self-pay | Admitting: Internal Medicine

## 2009-07-20 ENCOUNTER — Ambulatory Visit: Payer: Self-pay | Admitting: Cardiology

## 2009-07-20 LAB — CONVERTED CEMR LAB: POC INR: 1.9

## 2009-07-27 ENCOUNTER — Ambulatory Visit: Payer: Self-pay | Admitting: Internal Medicine

## 2009-07-27 DIAGNOSIS — D682 Hereditary deficiency of other clotting factors: Secondary | ICD-10-CM | POA: Insufficient documentation

## 2009-07-27 DIAGNOSIS — C50919 Malignant neoplasm of unspecified site of unspecified female breast: Secondary | ICD-10-CM | POA: Insufficient documentation

## 2009-07-27 LAB — CONVERTED CEMR LAB
ALT: 15 units/L (ref 0–35)
AST: 23 units/L (ref 0–37)
Albumin: 4.2 g/dL (ref 3.5–5.2)
Alkaline Phosphatase: 56 units/L (ref 39–117)
Bilirubin, Direct: 0.2 mg/dL (ref 0.0–0.3)
Cholesterol: 221 mg/dL — ABNORMAL HIGH (ref 0–200)
Direct LDL: 158.8 mg/dL
HDL: 56.4 mg/dL (ref >39.00)
TSH: 2.48 microintl units/mL (ref 0.35–5.50)
Total Bilirubin: 0.8 mg/dL (ref 0.3–1.2)
Total CHOL/HDL Ratio: 4
Total Protein: 7.9 g/dL (ref 6.0–8.3)
Triglycerides: 69 mg/dL (ref 0.0–149.0)
VLDL: 13.8 mg/dL (ref 0.0–40.0)

## 2009-07-28 ENCOUNTER — Telehealth: Payer: Self-pay | Admitting: Internal Medicine

## 2009-08-06 ENCOUNTER — Encounter: Payer: Self-pay | Admitting: Internal Medicine

## 2009-08-06 ENCOUNTER — Encounter: Admission: RE | Admit: 2009-08-06 | Discharge: 2009-08-06 | Payer: Self-pay | Admitting: Obstetrics & Gynecology

## 2009-08-06 LAB — HM MAMMOGRAPHY

## 2009-08-17 ENCOUNTER — Ambulatory Visit: Payer: Self-pay | Admitting: Internal Medicine

## 2009-08-17 LAB — CONVERTED CEMR LAB: POC INR: 1.7

## 2009-08-20 ENCOUNTER — Telehealth: Payer: Self-pay | Admitting: Internal Medicine

## 2009-08-23 DIAGNOSIS — R002 Palpitations: Secondary | ICD-10-CM | POA: Insufficient documentation

## 2009-08-23 DIAGNOSIS — R079 Chest pain, unspecified: Secondary | ICD-10-CM | POA: Insufficient documentation

## 2009-08-26 ENCOUNTER — Ambulatory Visit: Payer: Self-pay | Admitting: Cardiology

## 2009-08-30 ENCOUNTER — Ambulatory Visit (HOSPITAL_COMMUNITY): Admission: RE | Admit: 2009-08-30 | Discharge: 2009-08-30 | Payer: Self-pay | Admitting: Cardiology

## 2009-08-30 ENCOUNTER — Ambulatory Visit: Payer: Self-pay | Admitting: Cardiovascular Disease

## 2009-08-30 LAB — CONVERTED CEMR LAB
BUN: 23 mg/dL (ref 6–23)
Basophils Absolute: 0 10*3/uL (ref 0.0–0.1)
Basophils Relative: 0.7 % (ref 0.0–3.0)
CO2: 28 meq/L (ref 19–32)
Calcium: 8.9 mg/dL (ref 8.4–10.5)
Chloride: 99 meq/L (ref 96–112)
Creatinine, Ser: 0.9 mg/dL (ref 0.4–1.2)
Eosinophils Absolute: 0.1 10*3/uL (ref 0.0–0.7)
Eosinophils Relative: 1.7 % (ref 0.0–5.0)
GFR calc non Af Amer: 69.55 mL/min (ref >60)
Glucose, Bld: 132 mg/dL — ABNORMAL HIGH (ref 70–99)
HCT: 38.5 % (ref 36.0–46.0)
Hemoglobin: 12.9 g/dL (ref 12.0–15.0)
Lymphocytes Relative: 30.7 % (ref 12.0–46.0)
Lymphs Abs: 2.2 10*3/uL (ref 0.7–4.0)
MCHC: 33.6 g/dL (ref 30.0–36.0)
MCV: 94.5 fL (ref 78.0–100.0)
Monocytes Absolute: 1 10*3/uL (ref 0.1–1.0)
Monocytes Relative: 14.4 % — ABNORMAL HIGH (ref 3.0–12.0)
Neutro Abs: 3.8 10*3/uL (ref 1.4–7.7)
Neutrophils Relative %: 52.5 % (ref 43.0–77.0)
POC INR: 3
Platelets: 309 10*3/uL (ref 150.0–400.0)
Potassium: 3.3 meq/L — ABNORMAL LOW (ref 3.5–5.1)
RBC: 4.07 M/uL (ref 3.87–5.11)
RDW: 14.4 % (ref 11.5–14.6)
Sodium: 140 meq/L (ref 135–145)
WBC: 7.2 10*3/uL (ref 4.5–10.5)

## 2009-09-06 ENCOUNTER — Ambulatory Visit: Payer: Self-pay | Admitting: Cardiology

## 2009-09-08 LAB — CONVERTED CEMR LAB
BUN: 23 mg/dL (ref 6–23)
CO2: 29 meq/L (ref 19–32)
Calcium: 9.4 mg/dL (ref 8.4–10.5)
Chloride: 106 meq/L (ref 96–112)
Creatinine, Ser: 0.9 mg/dL (ref 0.4–1.2)
GFR calc non Af Amer: 64.28 mL/min (ref >60)
Glucose, Bld: 159 mg/dL — ABNORMAL HIGH (ref 70–99)
Potassium: 3.7 meq/L (ref 3.5–5.1)
Sodium: 143 meq/L (ref 135–145)

## 2009-09-20 ENCOUNTER — Ambulatory Visit: Payer: Self-pay | Admitting: Cardiology

## 2009-09-20 LAB — CONVERTED CEMR LAB: POC INR: 1.7

## 2009-10-12 ENCOUNTER — Ambulatory Visit: Payer: Self-pay | Admitting: Cardiovascular Disease

## 2009-10-12 LAB — CONVERTED CEMR LAB: POC INR: 2.2

## 2009-11-01 ENCOUNTER — Telehealth: Payer: Self-pay | Admitting: Cardiology

## 2009-11-09 ENCOUNTER — Ambulatory Visit: Payer: Self-pay | Admitting: Cardiology

## 2009-11-09 LAB — CONVERTED CEMR LAB: POC INR: 2.4

## 2009-11-24 ENCOUNTER — Telehealth: Payer: Self-pay | Admitting: Internal Medicine

## 2009-11-26 ENCOUNTER — Ambulatory Visit: Payer: Self-pay | Admitting: Internal Medicine

## 2009-12-02 ENCOUNTER — Encounter: Payer: Self-pay | Admitting: Internal Medicine

## 2009-12-02 ENCOUNTER — Ambulatory Visit: Payer: Self-pay | Admitting: Internal Medicine

## 2009-12-08 ENCOUNTER — Encounter: Payer: Self-pay | Admitting: Gastroenterology

## 2009-12-08 ENCOUNTER — Other Ambulatory Visit: Payer: Self-pay | Admitting: Oncology

## 2009-12-08 ENCOUNTER — Ambulatory Visit: Payer: Self-pay | Admitting: Cardiovascular Disease

## 2009-12-08 LAB — CBC WITH DIFFERENTIAL/PLATELET
BASO%: 0.7 % (ref 0.0–2.0)
Basophils Absolute: 0 10*3/uL (ref 0.0–0.1)
EOS%: 1.6 % (ref 0.0–7.0)
Eosinophils Absolute: 0.1 10*3/uL (ref 0.0–0.5)
HCT: 36.7 % (ref 34.8–46.6)
HGB: 12.5 g/dL (ref 11.6–15.9)
LYMPH%: 41.7 % (ref 14.0–49.7)
MCH: 31.9 pg (ref 25.1–34.0)
MCHC: 34.1 g/dL (ref 31.5–36.0)
MCV: 93.7 fL (ref 79.5–101.0)
MONO#: 0.7 10*3/uL (ref 0.1–0.9)
MONO%: 12 % (ref 0.0–14.0)
NEUT#: 2.7 10*3/uL (ref 1.5–6.5)
NEUT%: 44 % (ref 38.4–76.8)
Platelets: 285 10*3/uL (ref 145–400)
RBC: 3.92 10*6/uL (ref 3.70–5.45)
RDW: 13.9 % (ref 11.2–14.5)
WBC: 6.2 10*3/uL (ref 3.9–10.3)
lymph#: 2.6 10*3/uL (ref 0.9–3.3)

## 2009-12-08 LAB — CONVERTED CEMR LAB: POC INR: 2.5

## 2009-12-09 LAB — COMPREHENSIVE METABOLIC PANEL
ALT: 11 U/L (ref 0–35)
AST: 19 U/L (ref 0–37)
Albumin: 4.1 g/dL (ref 3.5–5.2)
Alkaline Phosphatase: 58 U/L (ref 39–117)
BUN: 24 mg/dL — ABNORMAL HIGH (ref 6–23)
CO2: 23 mEq/L (ref 19–32)
Calcium: 9.4 mg/dL (ref 8.4–10.5)
Chloride: 104 mEq/L (ref 96–112)
Creatinine, Ser: 0.88 mg/dL (ref 0.40–1.20)
Glucose, Bld: 142 mg/dL — ABNORMAL HIGH (ref 70–99)
Potassium: 3.7 mEq/L (ref 3.5–5.3)
Sodium: 138 mEq/L (ref 135–145)
Total Bilirubin: 0.5 mg/dL (ref 0.3–1.2)
Total Protein: 7.3 g/dL (ref 6.0–8.3)

## 2009-12-09 LAB — CANCER ANTIGEN 27.29: CA 27.29: 14 U/mL (ref 0–39)

## 2009-12-09 LAB — VITAMIN D 25 HYDROXY (VIT D DEFICIENCY, FRACTURES): Vit D, 25-Hydroxy: 34 ng/mL (ref 30–89)

## 2009-12-15 ENCOUNTER — Encounter: Payer: Self-pay | Admitting: Internal Medicine

## 2009-12-25 ENCOUNTER — Encounter: Payer: Self-pay | Admitting: Internal Medicine

## 2009-12-30 ENCOUNTER — Telehealth: Payer: Self-pay | Admitting: Internal Medicine

## 2010-01-04 ENCOUNTER — Telehealth: Payer: Self-pay | Admitting: Internal Medicine

## 2010-01-04 ENCOUNTER — Ambulatory Visit: Payer: Self-pay | Admitting: Cardiovascular Disease

## 2010-01-04 LAB — CONVERTED CEMR LAB: POC INR: 2.9

## 2010-01-05 ENCOUNTER — Ambulatory Visit: Payer: Self-pay | Admitting: Internal Medicine

## 2010-01-05 DIAGNOSIS — L84 Corns and callosities: Secondary | ICD-10-CM | POA: Insufficient documentation

## 2010-01-25 ENCOUNTER — Ambulatory Visit
Admission: RE | Admit: 2010-01-25 | Discharge: 2010-01-25 | Payer: Self-pay | Source: Home / Self Care | Attending: Internal Medicine | Admitting: Internal Medicine

## 2010-01-25 LAB — CONVERTED CEMR LAB: POC INR: 2.7

## 2010-02-01 ENCOUNTER — Ambulatory Visit: Admission: RE | Admit: 2010-02-01 | Discharge: 2010-02-01 | Payer: Self-pay | Source: Home / Self Care

## 2010-02-01 LAB — CONVERTED CEMR LAB: POC INR: 1.9

## 2010-02-03 ENCOUNTER — Telehealth: Payer: Self-pay | Admitting: Internal Medicine

## 2010-02-08 ENCOUNTER — Ambulatory Visit: Admit: 2010-02-08 | Payer: Self-pay

## 2010-02-20 ENCOUNTER — Encounter: Payer: Self-pay | Admitting: Internal Medicine

## 2010-02-24 ENCOUNTER — Telehealth: Payer: Self-pay | Admitting: Internal Medicine

## 2010-02-24 ENCOUNTER — Other Ambulatory Visit: Payer: Self-pay | Admitting: Internal Medicine

## 2010-02-24 ENCOUNTER — Ambulatory Visit
Admission: RE | Admit: 2010-02-24 | Discharge: 2010-02-24 | Payer: Self-pay | Source: Home / Self Care | Attending: Internal Medicine | Admitting: Internal Medicine

## 2010-02-24 LAB — URINALYSIS, ROUTINE W REFLEX MICROSCOPIC
Bilirubin Urine: NEGATIVE
Ketones, ur: NEGATIVE
Nitrite: NEGATIVE
Specific Gravity, Urine: 1.02 (ref 1.000–1.030)
Total Protein, Urine: NEGATIVE
Urine Glucose: NEGATIVE
Urobilinogen, UA: 0.2 (ref 0.0–1.0)
pH: 6 (ref 5.0–8.0)

## 2010-03-01 ENCOUNTER — Ambulatory Visit: Admission: RE | Admit: 2010-03-01 | Discharge: 2010-03-01 | Payer: Self-pay | Source: Home / Self Care

## 2010-03-01 LAB — CONVERTED CEMR LAB: POC INR: 2.1

## 2010-03-01 NOTE — Progress Notes (Signed)
Summary: MRSA  Phone Note Call from Patient Call back at Home Phone [phone]   Caller: Patient [phone] Summary of Call: Pt called stating that she was exposed to MRSA 3 days ago when she hugged a church member that she thinks, was told that he had MRSA the day prior.  Pt states that she was informed via email from her church. I denies actual knowledge of the location, duration or if this person has begun treatment. Pt is requesting MEN advise, what can she do to prevent catching MRSA? Please advise Initial call taken by: Margaret Pyle, CMA,  August 20, 2009 11:32 AM  Follow-up for Phone Call        without open wound exposure or body fluid contact, not counting perspiration, very little chance of infection. Best protection is good handwashing.  Follow-up by: Jacques Navy MD,  August 20, 2009 2:54 PM  Additional Follow-up for Phone Call Additional follow up Details #1::        informed pt Additional Follow-up by: Brenton Grills MA,  August 20, 2009 3:28 PM

## 2010-03-01 NOTE — Letter (Signed)
Summary: [facility]  [facility]   Imported By: Lennie Odor 12/24/2009 12:45:19  _____________________________________________________________________  External Attachment:    Type:   Image     Comment:   External Document

## 2010-03-01 NOTE — Assessment & Plan Note (Signed)
Summary: foot pain/SD   Vital Signs:  Patient profile:   77 year old female Height:      64 inches Weight:      167 pounds BMI:     28.77 O2 Sat:      96 % on Room air Temp:     97.3 degrees F oral Pulse rate:   69 / minute BP sitting:   130 / 70  (left arm) Cuff size:   regular  Vitals Entered By: Bill Salinas CMA (January 05, 2010 11:20 AM)  O2 Flow:  Room air CC: pt here with c/o soreness and pain in her right foot/ ab   Primary Care Provider:  Micheal Joshuan Bolander,MD  CC:  pt here with c/o soreness and pain in her right foot/ ab.  History of Present Illness: Patient presents with a sore 5th digit right foot. She has noted a dark spot on the medial aspect of the 5th toe that is very tender. Tight shoes hurt and with pressure she reports pain will go all the way up her leg to the knee. No paresthesia, motor weakness. NO known injury.  Current Medications (verified): 1)  Warfarin Sodium 5 Mg Tabs (Warfarin Sodium) .... Use As Directed By Anticoagulation Clinic 2)  Niaspan 500 Mg  Tbcr (Niacin (Antihyperlipidemic)) .... Take 3 Tab By Mouth At Bedtime 3)  Fish Oil 1000 Mg  Caps (Omega-3 Fatty Acids) .Marland Kitchen.. 1 Qd 4)  Hydrochlorothiazide 12.5 Mg  Tabs (Hydrochlorothiazide) .Marland Kitchen.. 1 Qd 5)  Fa-Cyanocobalamin-Pyridoxine 2.05-31-23 Mg Tabs (Folic Acid-Vit B6-Vit B12) .... Take 1 Tablet By Mouth Once A Day 6)  Magnesium Oxide 250 Mg  Tabs (Magnesium Oxide) .... Take 1 Tablet By Mouth Once A Day 7)  Klor-Con M20 20 Meq Cr-Tabs (Potassium Chloride Crys Cr) .... One Tablet By Mouth Once Daily 8)  Lisinopril 10 Mg  Tabs (Lisinopril) .Marland Kitchen.. 1 By Mouth Daily 9)  Caltrate 600+d 600-400 Mg-Unit Tabs (Calcium Carbonate-Vitamin D) 10)  Nasacort Aq 55 Mcg/act Aers (Triamcinolone Acetonide(Nasal)) .Marland Kitchen.. 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 Jamey Ripa) FH reviewed for relevance, SH/Risk Factors reviewed for relevance  Review of Systems  The patient denies fever, weight loss, weight gain, chest pain, syncope, dyspnea on exertion, peripheral edema, abdominal pain, muscle weakness, and difficulty walking.    Physical Exam  General:  Well-developed,well-nourished,in no acute distress; alert,appropriate and cooperative throughout examination Lungs:  normal respiratory effort.   Heart:  normal rate and regular rhythm.   Msk:  no joint tenderness, no joint swelling, no joint warmth, no redness over joints, and no joint instability.   Skin:  small callus with a pinpoint dark center medial aspect of 5th digit right foot. There is pressure patter from overlay with the 4th digit. No erythema.   Impression & Recommendations:  Problem # 1:  CALLUS, TOE (ICD-700) Developing callus medial aspect 5th digit right foot.  Plan - toe spacer.          if the callus progresses will  refer to podiatry.  Complete Medication List: 1)  Warfarin Sodium 5 Mg Tabs (Warfarin sodium) .... Use as directed by anticoagulation clinic 2)  Niaspan 500 Mg Tbcr (Niacin (antihyperlipidemic)) .... Take 3 tab by mouth at bedtime 3)  Fish Oil 1000 Mg Caps (Omega-3 fatty acids) .Marland Kitchen.. 1 qd 4)  Hydrochlorothiazide 12.5 Mg Tabs (Hydrochlorothiazide) .Marland Kitchen.. 1 qd 5)  Fa-cyanocobalamin-pyridoxine 2.05-31-23 Mg Tabs (Folic acid-vit b6-vit b12) .... Take 1 tablet by mouth once a day 6)  Magnesium  Oxide 250 Mg Tabs (magnesium Oxide)  .... Take 1 tablet by mouth once a day 7)  Klor-con M20 20 Meq Cr-tabs (Potassium chloride crys cr) .... One tablet by mouth once daily 8)  Lisinopril 10 Mg Tabs (Lisinopril) .Marland Kitchen.. 1 by mouth daily 9)  Caltrate 600+d 600-400 Mg-unit Tabs (Calcium carbonate-vitamin d) 10)  Nasacort Aq 55 Mcg/act Aers (Triamcinolone acetonide(nasal)) .Marland Kitchen.. 1 spray each nostril daily prn   Orders Added: 1)  Est. Patient Level III [16109]

## 2010-03-01 NOTE — Progress Notes (Signed)
  Phone Note Call from Patient Call back at [facility] Phone [phone]   Caller: Patient [phone] Summary of Call: Pt called stating she recieved letter with results of DXA scan - bone loss LT hip. Pt is requesting MD recommendation on treatment, please advise. Initial call taken by: Margaret Pyle, CMA,  December 30, 2009 1:21 PM  Follow-up for Phone Call        mild loss = osteopenia. No medical therapy indicated. Continue with calcium supplement, Vit D [phone] international units daily and mild weight bearing exercise Follow-up by: Jacques Navy MD,  December 30, 2009 3:47 PM  Additional Follow-up for Phone Call Additional follow up Details #1::        Pt informed  Additional Follow-up by: Lamar Sprinkles, CMA,  December 30, 2009 4:28 PM

## 2010-03-01 NOTE — Assessment & Plan Note (Signed)
Summary: f1y   Medications Added NASACORT AQ 55 MCG/ACT AERS (TRIAMCINOLONE ACETONIDE(NASAL)) 1 spray each nostril daily PRN      Allergies Added:   Visit Type:  Follow-up Primary Provider:  Micheal Norins,MD  CC:  chest pain  / shoulder arm pain.  History of Present Illness: Ms. Shrake is a pleasant  female with past medical history of mildly dilated thoracic aorta, hypertension, factor V Leiden who I saw in February of 2010 for chest pain. A nuclear study was performed in March 2010 and showed an ejection fraction of 77% and normal perfusion. Last him on a of her thoracic aorta in March of 2009 showed mild dilatation at 4 cm. Since she was seen in February of 2010 she has done well. She denies any dyspnea, palpitations or syncope. She does not have exertional chest pain. 4 days ago she did have sharp pain in her right shoulder that lasted seconds and concerned her. It radiated down her right upper extremity. There were no associated symptoms.   Current Medications (verified): 1)  Warfarin Sodium 5 Mg Tabs (Warfarin Sodium) .... Use As Directed By Anticoagulation Clinic 2)  Niaspan 500 Mg  Tbcr (Niacin (Antihyperlipidemic)) .... Take 3 Tab By Mouth At Bedtime 3)  Fish Oil 1000 Mg  Caps (Omega-3 Fatty Acids) .Marland Kitchen.. 1 Qd 4)  Hydrochlorothiazide 12.5 Mg  Tabs (Hydrochlorothiazide) .Marland Kitchen.. 1 Qd 5)  Fa-Cyanocobalamin-Pyridoxine 2.05-31-23 Mg Tabs (Folic Acid-Vit B6-Vit B12) .... Take 1 Tablet By Mouth Once A Day 6)  Magnesium Oxide 250 Mg  Tabs (Magnesium Oxide) .... Take 1 Tablet By Mouth Once A Day 7)  Potassium Gluconate 595 Mg  Tabs (Potassium Gluconate) .... Take 1 Tablet By Mouth Once A Day 8)  Lisinopril 10 Mg  Tabs (Lisinopril) .Marland Kitchen.. 1 By Mouth Daily 9)  Caltrate 600+d 600-400 Mg-Unit Tabs (Calcium Carbonate-Vitamin D) 10)  Nasacort Aq 55 Mcg/act Aers (Triamcinolone Acetonide(Nasal)) .Marland Kitchen.. 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 Total hip replacement-right '99 Hysterectomy Splenectomy-'80s Right breast Lumpectomy '10 (Dr. Jamey Ripa)  Social History: Reviewed history from 07/27/2009 and no changes required. married 1955 2 sons- '59, '61, 2 daughters-'55, '57; 8 grandchildren; 1 great-grand I-ADLs  Occupation: retired Patient has never smoked.  Alcohol Use - no Illicit Drug Use - no  Review of Systems       no fevers or chills, productive cough, hemoptysis, dysphasia, odynophagia, melena, hematochezia, dysuria, hematuria, rash, seizure activity, orthopnea, PND, pedal edema, claudication. Remaining systems are negative.   Vital Signs:  Patient profile:   77 year old female Height:      64 inches Weight:      168 pounds BMI:     28.94 Pulse rate:   72 / minute BP sitting:   134 / 78  (left arm) Cuff size:   regular  Vitals Entered By: Burnett Kanaris, CNA (August 26, 2009 8:50 AM)  Physical Exam  General:  Well-developed well-nourished in no acute distress.  Skin is warm and dry.  HEENT is normal.  Neck is supple. No thyromegaly.  Chest  is clear to auscultation with normal expansion.  Cardiovascular exam is regular rate and rhythm.  Abdominal exam nontender or distended. No masses palpated. Extremities show no edema. neuro grossly intact    EKG  Procedure date:  08/26/2009  Findings:      Normal sinus rhythm at a rate of 70. Left anterior fascicular block. Left ventricular hypertrophy.  Impression &  Recommendations:  Problem # 1:  ANEURYSM, THORACIC AORTIC (ICD-441.2)  Plan repeat MRA.  Orders: EKG w/ Interpretation (93000) TLB-CBC Platelet - w/Differential (85025-CBCD) TLB-BMP (Basic Metabolic Panel-BMET) (80048-METABOL) MRA (MRA)  Problem # 2:  FACTOR V DEFICIENCY (ICD-286.3)  Continue Coumadin with goal INR 2-3. Check CBC.  Orders: EKG w/ Interpretation (93000) TLB-CBC Platelet - w/Differential (85025-CBCD) TLB-BMP (Basic Metabolic Panel-BMET) (80048-METABOL) MRA (MRA)  Problem # 3:  UNSPECIFIED ESSENTIAL HYPERTENSION (ICD-401.9)  Blood pressure controlled. Continue present medications. Check potassium and renal function. The following medications were removed from the medication list:    Metoprolol Succinate 25 Mg Xr24h-tab (Metoprolol succinate) .Marland Kitchen... Take one tablet by mouth every 8 hours as needed Her updated medication list for this problem includes:    Hydrochlorothiazide 12.5 Mg Tabs (Hydrochlorothiazide) .Marland Kitchen... 1 qd    Lisinopril 10 Mg Tabs (Lisinopril) .Marland Kitchen... 1 by mouth daily  The following medications were removed from the medication list:    Metoprolol Succinate 25 Mg Xr24h-tab (Metoprolol succinate) .Marland Kitchen... Take one tablet by mouth every 8 hours as needed Her updated medication list for this problem includes:    Hydrochlorothiazide 12.5 Mg Tabs (Hydrochlorothiazide) .Marland Kitchen... 1 qd    Lisinopril 10 Mg Tabs (Lisinopril) .Marland Kitchen... 1 by mouth daily  Problem # 4:  DVT, HX OF (ICD-V12.51)  Her updated medication list for this problem includes:    Warfarin Sodium 5 Mg Tabs (Warfarin sodium) ..... Use as directed by anticoagulation clinic  Her updated medication list for this problem includes:    Warfarin Sodium 5 Mg Tabs (Warfarin sodium) ..... Use as directed by anticoagulation clinic  Problem # 5:  OTHER AND UNSPECIFIED HYPERLIPIDEMIA (ICD-272.4)  Management per primary care. Her updated medication list for this problem includes:    Niaspan 500 Mg Tbcr (Niacin  (antihyperlipidemic)) .Marland Kitchen... Take 3 tab by mouth at bedtime  Her updated medication list for this problem includes:    Niaspan 500 Mg Tbcr (Niacin (antihyperlipidemic)) .Marland Kitchen... Take 3 tab by mouth at bedtime  Patient Instructions: 1)  Your physician recommends that you have lab work  today: bmet/cbc (401.1) 2)  Your physician recommends that you continue on your current medications as directed. Please refer to the Current Medication list given to you today. 3)  Your physician wants you to follow-up in: 1 year.   You will receive a reminder letter in the mail two months in advance. If you don't receive a letter, please call our office to schedule the follow-up appointment. 4)  You are being scheduled for an MRA of the thoracic aorta.

## 2010-03-01 NOTE — Assessment & Plan Note (Signed)
Summary: FOLLOW UP EGD/[facility]  Medications Added ACIPHEX 20 MG TBEC (RABEPRAZOLE SODIUM) 1 tablet by mouth once daily        History of Present Illness Visit Type: follow up Primary GI MD: Sheryn Bison MD FACP FAGA Primary Provider: Cristal Deer Requesting Provider: n/a Chief Complaint: follow-up visit from EGD, still c/o some burning in her esophagus History of Present Illness:   [name] Little is asymptomatic except for occasional acid reflux. Her endoscopy and colonoscopy were completed without difficulty. Her colonoscopy showed a very long and redundant colon with diverticulosis but no definite polyps or bleeding lesions were noted. Her endoscopy showed erosive gastritis and evaluation for H. pylori was negative. She is placed on AcipHex 20 mg a day. The patient continues to deny use of aspirin or NSAIDs. She is completely asymptomatic in terms of upper or lower GI complaints. She is chronically on Coumadin is followed by Dr. Debby Bud.She has a chronic coagulopathy and factor V deficiency. She also suffers from ITP.She has had previous splenectomy.   GI Review of Systems    Reports belching.      Denies abdominal pain, acid reflux, bloating, chest pain, dysphagia with liquids, dysphagia with solids, heartburn, loss of appetite, nausea, vomiting, vomiting blood, weight loss, and  weight gain.        Denies anal fissure, black tarry stools, change in bowel habit, constipation, diarrhea, diverticulosis, fecal incontinence, heme positive stool, hemorrhoids, irritable bowel syndrome, jaundice, light color stool, liver problems, rectal bleeding, and  rectal pain.     Prior Medications Reviewed Using: Patient Recall  Updated Prior Medication List: COUMADIN 5 MG  TABS (WARFARIN SODIUM) 5 mg for 5 days then 2 1/2 mg for 2 days NIASPAN 500 MG  TBCR (NIACIN (ANTIHYPERLIPIDEMIC)) Take 3 tab by mouth at bedtime FISH OIL 1000 MG  CAPS (OMEGA-3 FATTY ACIDS) 1 qd HYDROCHLOROTHIAZIDE 12.5 MG  TABS  (HYDROCHLOROTHIAZIDE) 1 qd FA-CYANOCOBALAMIN-PYRIDOXINE 2.05-31-23 MG TABS (FOLIC ACID-VIT B6-VIT B12) Take 1 tablet by mouth once a day VITAMIN C 1000 MG  TABS (ASCORBIC ACID) Take 1 tablet by mouth once a day MAGNESIUM OXIDE 400 MG  TABS (MAGNESIUM OXIDE) Take 1 tablet by mouth once a day POTASSIUM GLUCONATE 595 MG  TABS (POTASSIUM GLUCONATE) Take 1 tablet by mouth once a day LISINOPRIL 10 MG  TABS (LISINOPRIL) 1 by mouth daily  Current Allergies (reviewed today): SULFA CEFTIN ULTRAM  Past Medical History:    Reviewed history from 12/11/2007 and no changes required:       DVT, hx of       ITP S/P Splenectomy       Factor V Leidan       Dissecting Aortic Aneurysm-ascending thoracic  Past Surgical History:    Reviewed history from 12/10/2007 and no changes required:       Total hip replacement       Hysterectomy       Splenectomy   Family History:    Reviewed history from 12/10/2007 and no changes required:       positive for cardiovascular disease in parents and uncle.       Family History of Clotting disorder:  uncle       No FH of Colon Cancer:  Social History:    Reviewed history from 12/10/2007 and no changes required:       married 1953       2 sons, 2 daughters       I-ADLs        Occupation: retired  Patient has never smoked.        Alcohol Use - no       Illicit Drug Use - no     Vital Signs:  Patient Profile:   77 Years Old Female Height:     64 inches Weight:      170 pounds BMI:     29.29 Pulse rate:   76 / minute Pulse rhythm:   regular BP sitting:   130 / 76  (left arm)  Vitals Entered By: Merri Ray CMA (February 04, 2008 10:44 AM)                  Physical Exam  General:     Well developed, well nourished, no acute distress.healthy appearing.      Impression & Recommendations:  Problem # 1:  ACUTE GASTRITIS WITHOUT MENTION OF HEMORRHAGE (ICD-535.00) Assessment: Improved I have decided to order CBC and iron studies and  repeat Hemoccult cards while continuing daily AcipHex.  Problem # 2:  COLONIC POLYPS, HX OF (ICD-V12.72) Assessment: Unchanged followup colonoscopy as per clinical protocol.  Problem # 3:  FECAL OCCULT BLOOD (ICD-792.1) Assessment: Unchanged repeat Hemoccult exams. Orders: TLB-Iron, (Fe) Total (83540-FE) TLB-B12, Serum-Total ONLY (01027-O53) TLB-Ferritin (82728-FER) TLB-Folic Acid (Folate) (82746-FOL) TLB-IBC Pnl (Iron/FE;Transferrin) (83550-IBC)    Patient Instructions: 1)  Labs ordered for patient to have drawn today. 2)  Hemoccult cards given to patient to mail back in 2 wks. 3)  Aciphex samples given to patient 4)  Aciphex Rx. sent to pharmacy. 5)  Please Continue current medications. 6)  Copy Sent To:Dr. Illene Regulus 7)  Please schedule a follow-up appointment as needed.    Prescriptions: ACIPHEX 20 MG TBEC (RABEPRAZOLE SODIUM) 1 tablet by mouth once daily  #30 x 11   Entered by:   Milford Cage CMA   Authorized by:   Mardella Layman MD [facility]   Signed by:   Milford Cage CMA on 02/04/2008   Method used:   Electronically to        [id number] (ret[facility])       [address]       [place], [place]  [address]       Ph: [phone] or [phone]       Fax: [phone]   RxID:   [id number]  ]

## 2010-03-01 NOTE — Miscellaneous (Signed)
Summary: BONE DENSITY  Clinical Lists Changes  Orders: Added new Test order of T-Lumbar Vertebral Assessment (77082) - Signed Added new Test order of T-Bone Densitometry (77080) - Signed 

## 2010-03-01 NOTE — Medication Information (Signed)
Summary: rov/ewj  Anticoagulant Therapy  Managed by: Eda Keys, PharmD Referring MD: Illene Regulus PCP: Cristal Deer Supervising MD: Shirlee Latch MD, [place] Indication 1: Deep Vein Thrombosis - Leg (ICD-451.1) Lab Used: [facility] [place] Site: [address] INR POC 1.9 INR RANGE 2 - 3  Dietary changes: yes       Details: more greens than normal, salads and cabbage  Health status changes: no    Bleeding/hemorrhagic complications: no    Recent/future hospitalizations: no    Any changes in medication regimen? no    Recent/future dental: no  Any missed doses?: no       Is patient compliant with meds? yes       Allergies: 1)  Sulfa 2)  Ceftin  Anticoagulation Management History:      The patient is taking warfarin and comes in today for a routine follow up visit.  Positive risk factors for bleeding include an age of 75 years or older.  The bleeding index is 'intermediate risk'.  Positive CHADS2 values include History of HTN.  Negative CHADS2 values include Age > 25 years old.  The start date was 05/16/2004.  Anticoagulation responsible provider: Shirlee Latch MD, [place].  INR POC: 1.9.  Cuvette Lot#: [id number].  Exp: 05/2010.    Anticoagulation Management Assessment/Plan:      The patient's current anticoagulation dose is Warfarin sodium 5 mg tabs: Use as directed by Anticoagulation Clinic.  The target INR is 2 - 3.  The next INR is due 05/24/2009.  Anticoagulation instructions were given to patient.  Results were reviewed/authorized by Eda Keys, PharmD.  She was notified by Eda Keys.         Prior Anticoagulation Instructions: INR 1.9 Today take 5mg s then resume 5mg s  daily except 2.5mg s on Mondays, Wednesdays and Fridays. Recheck in 4 weeks.   Current Anticoagulation Instructions: INR 1.9  Take 1 tablet today.  Then return to normal dosing schedule of 1/2 tablet on Monday, Wednesday, and friday and 1 tablet all other days.  Return to clinic in 4  weeks.

## 2010-03-01 NOTE — Medication Information (Signed)
Summary: rov coumadin - [facility]   Anticoagulant Therapy  Managed by: Weston Brass, PharmD Referring MD: Illene Regulus PCP: Cristal Deer Supervising MD: Eden Emms MD, Theron Arista Indication 1: Deep Vein Thrombosis - Leg (ICD-451.1) Lab Used: [facility] [place] Site: [address] INR POC 2.9 INR RANGE 2 - 3  Dietary changes: no    Health status changes: no    Bleeding/hemorrhagic complications: no    Recent/future hospitalizations: no    Any changes in medication regimen? no    Recent/future dental: no  Any missed doses?: no       Is patient compliant with meds? yes       Allergies: 1)  Sulfa 2)  Ceftin  Anticoagulation Management History:      The patient is taking warfarin and comes in today for a routine follow up visit.  Positive risk factors for bleeding include an age of 40 years or older.  The bleeding index is 'intermediate risk'.  Positive CHADS2 values include History of HTN.  Negative CHADS2 values include Age > 27 years old.  The start date was 05/16/2004.  Anticoagulation responsible provider: Eden Emms MD, Theron Arista.  INR POC: 2.9.  Cuvette Lot#: [id number].  Exp: 10/2010.    Anticoagulation Management Assessment/Plan:      The patient's current anticoagulation dose is Warfarin sodium 5 mg tabs: Use as directed by Anticoagulation Clinic.  The target INR is 2 - 3.  The next INR is due 02/08/2010.  Anticoagulation instructions were given to patient.  Results were reviewed/authorized by Weston Brass, PharmD.  She was notified by Weston Brass PharmD.         Prior Anticoagulation Instructions: INR 2.5  Coumadin 5mg  tabs, take 1 tab each day except 1/2 tab on Mon and Fri  Current Anticoagulation Instructions: INR 2.9  Continue same dose of 1 tablet every day except 1/2 tablet on Monday and Friday.  Recheck INR in 4 weeks.

## 2010-03-01 NOTE — Progress Notes (Signed)
Summary: Labs  Phone Note Call from Patient Call back at Home Phone [phone]   Summary of Call: Patient was seen recently and forgot to get back copies of labs from the cancer center. Please mail to patient home when MD is done with them. Initial call taken by: Lucious Groves,  July 28, 2009 10:02 AM  Follow-up for Phone Call        Patient would like copies of labs mailed to her home when MD has finished w/them.  Follow-up by: Lamar Sprinkles, CMA,  July 28, 2009 1:38 PM  Additional Follow-up for Phone Call Additional follow up Details #1::        office note and labs mailed to pt Additional Follow-up by: Ami Bullins CMA,  July 29, 2009 9:48 AM

## 2010-03-01 NOTE — Medication Information (Signed)
Summary: rov/ewj  Anticoagulant Therapy  Managed by: Shelby Dubin, PharmD, BCPS, CPP Referring MD: Illene Regulus PCP: Cristal Deer Supervising MD: Riley Kill MD, Maisie Fus Indication 1: Deep Vein Thrombosis - Leg (ICD-451.1) Lab Used: [facility] [place] Site: [address] INR POC 2.5 INR RANGE 2 - 3  Dietary changes: no    Health status changes: yes       Details: L LE varicose vein is "sore" at popliteal fossa  Bleeding/hemorrhagic complications: no    Recent/future hospitalizations: no    Any changes in medication regimen? no    Recent/future dental: no  Any missed doses?: no       Is patient compliant with meds? yes       Allergies (verified): 1)  Sulfa 2)  Ceftin  Anticoagulation Management History:      The patient is taking warfarin and comes in today for a routine follow up visit.  Positive risk factors for bleeding include an age of 75 years or older.  The bleeding index is 'intermediate risk'.  Positive CHADS2 values include History of HTN.  Negative CHADS2 values include Age > 75 years old.  The start date was 05/16/2004.  Anticoagulation responsible provider: Riley Kill MD, Maisie Fus.  INR POC: 2.5.  Cuvette Lot#: [id number].  Exp: 03/2010.    Anticoagulation Management Assessment/Plan:      The patient's current anticoagulation dose is Warfarin sodium 5 mg tabs: Use as directed by Anticoagulation Clinic.  The target INR is 2 - 3.  The next INR is due 03/01/2009.  Anticoagulation instructions were given to patient.  Results were reviewed/authorized by Shelby Dubin, PharmD, BCPS, CPP.  She was notified by Shelby Dubin PharmD, BCPS, CPP.         Prior Anticoagulation Instructions: INR 2.3  Continue on same dosage 1 tablet daily except 1/2 tablet on Mondays, Wednesdays, and Fridays.  Recheck in 4 weeks.    Current Anticoagulation Instructions: INR 2.5  Continue 1 tab daily except 0.5 tab on Monday, Wednesday, Friday.  Recheck in 4 weeks.

## 2010-03-01 NOTE — Medication Information (Signed)
Summary: rov/sp  Anticoagulant Therapy  Managed by: Eda Keys, PharmD Referring MD: Illene Regulus PCP: Cristal Deer Supervising MD: Jens Som MD, Arlys John Indication 1: Deep Vein Thrombosis - Leg (ICD-451.1) Lab Used: [facility] [place] Site: [address] INR POC 2.3 INR RANGE 2 - 3  Dietary changes: no    Health status changes: no    Bleeding/hemorrhagic complications: no    Recent/future hospitalizations: no    Any changes in medication regimen? no    Recent/future dental: no  Any missed doses?: no       Is patient compliant with meds? yes       Allergies: 1)  Sulfa 2)  Ceftin  Anticoagulation Management History:      The patient is taking warfarin and comes in today for a routine follow up visit.  Positive risk factors for bleeding include an age of 31 years or older.  The bleeding index is 'intermediate risk'.  Positive CHADS2 values include History of HTN.  Negative CHADS2 values include Age > 36 years old.  The start date was 05/16/2004.  Anticoagulation responsible provider: Jens Som MD, Arlys John.  INR POC: 2.3.  Cuvette Lot#: [id number].  Exp: 08/2010.    Anticoagulation Management Assessment/Plan:      The patient's current anticoagulation dose is Warfarin sodium 5 mg tabs: Use as directed by Anticoagulation Clinic.  The target INR is 2 - 3.  The next INR is due 06/21/2009.  Anticoagulation instructions were given to patient.  Results were reviewed/authorized by Eda Keys, PharmD.         Prior Anticoagulation Instructions: INR 2.5  Continue same dose of 1 tablet every day except 1/2 tablet on Monday, Wednesday and Friday   Current Anticoagulation Instructions: INR 2.3  Continue taking 1/2 tablet on Monday, Wednesday, and Friday and 1 tablet all other days.  Return to clinic in 4 weeks.

## 2010-03-01 NOTE — Progress Notes (Signed)
Summary: PAIN   Phone Note Call from Patient Call back at Home Phone [phone] Call back at [phone]   Summary of Call: Patient is requesting a call back regarding her medications.  Initial call taken by: Lamar Sprinkles, CMA,  November 24, 2009 12:29 PM  Follow-up for Phone Call        lmoam Follow-up by: Ami Bullins CMA,  November 24, 2009 12:47 PM  Additional Follow-up for Phone Call Additional follow up Details #1::        Spoke w/pt. She has been waking up around 3am every morning w/shoulder pain that goes down into her lower back. Pharmacist advised pt to ask MD if niaspan could be causing this problem. She has had symptoms x 2 wks.   Pt's only medication change recently was the addition of potassium by Dr Jens Som.  Additional Follow-up by: Lamar Sprinkles, CMA,  November 24, 2009 2:13 PM    Additional Follow-up for Phone Call Additional follow up Details #2::    Not the niaspan. OV may help in figuring out her pain. Follow-up by: Jacques Navy MD,  November 24, 2009 4:28 PM  Additional Follow-up for Phone Call Additional follow up Details #3:: Details for Additional Follow-up Action Taken: left mess to call office back................Marland KitchenLamar Sprinkles, CMA  November 24, 2009 5:19 PM   If she feels that the niaspan is responsible she can stop medication and see if her symptoms resolve. If so she will need to be seen to adjust therapy or if she already goes to lipid clinic she can be seen there.   patinet seen in office Additional Follow-up by: Jacques Navy MD,  November 26, 2009 9:46 AM  spoke with pt and she states that she did not take the niaspan and she did not have the pain. she thinks it is coming from the niaspan. Ami Bullins CMA  November 25, 2009 8:34 AM   pt is coming in today to discuss this/ Ami Bullins CMA  November 26, 2009 11:14 AM

## 2010-03-01 NOTE — Medication Information (Signed)
Summary: ROV.MP  Anticoagulant Therapy  Managed by: Bethena Midget, RN, BSN Referring MD: Illene Regulus PCP: Cristal Deer Supervising MD: Tenny Craw MD, Gunnar Fusi Indication 1: Deep Vein Thrombosis - Leg (ICD-451.1) Lab Used: [facility] [place] Site: [address] PT 26.4 INR POC 4.7 INR RANGE 2 - 3  Dietary changes: yes       Details: Pt. states diet is poor  Health status changes: no    Bleeding/hemorrhagic complications: no    Recent/future hospitalizations: no    Any changes in medication regimen? no    Recent/future dental: no  Any missed doses?: no       Is patient compliant with meds? yes      Comments: Receive Radiation Tx today, it is her 5th treatment she thinks she has approx. 30 treatments  Allergies (verified): 1)  Sulfa 2)  Ceftin  Anticoagulation Management History:      The patient is taking warfarin and comes in today for a routine follow up visit.  Positive risk factors for bleeding include an age of 8 years or older.  The bleeding index is 'intermediate risk'.  Positive CHADS2 values include History of HTN.  Negative CHADS2 values include Age > 22 years old.  The start date was 05/16/2004.  Prothrombin time is 26.4.  Anticoagulation responsible provider: Tenny Craw MD, Gunnar Fusi.  INR POC: 4.7.  Cuvette Lot#: [id number].  Exp: 07/2009.    Anticoagulation Management Assessment/Plan:      The patient's current anticoagulation dose is Warfarin sodium 5 mg tabs: Use as directed by Anticoagulation Clinic.  The target INR is 2 - 3.  The next INR is due 09/21/2008.  Anticoagulation instructions were given to patient.  Results were reviewed/authorized by Bethena Midget, RN, BSN.  She was notified by Bethena Midget, RN, BSN.         Prior Anticoagulation Instructions: INR 2.4 Continue 2.5 mg each Monday and 5 mg on all other days.    Current Anticoagulation Instructions: INR 4.7 Skip todays dose take 2.5mg  tomorrow,  then take 5mg s daily except 2.5mg s on Mondays and  Fridays.

## 2010-03-01 NOTE — Letter (Signed)
Summary: [facility]   Imported By: Sherian Rein 08/20/2009 11:19:06  _____________________________________________________________________  External Attachment:    Type:   Image     Comment:   External Document

## 2010-03-01 NOTE — Letter (Signed)
Summary: [facility]   Imported By: Sherian Rein 02/15/2009 14:40:12  _____________________________________________________________________  External Attachment:    Type:   Image     Comment:   External Document

## 2010-03-01 NOTE — Letter (Signed)
[facility]   Imported By: Sherian Rein 05/20/2009 13:14:52  _____________________________________________________________________  External Attachment:    Type:   Image     Comment:   External Document

## 2010-03-01 NOTE — Medication Information (Signed)
Summary: [name] Little      Allergies Added:  Anticoagulant Therapy  Managed by: Reina Fuse, PharmD Referring MD: Illene Regulus PCP: Cristal Deer Supervising MD: Juanda Chance MD, Kartel Wolbert Indication 1: Deep Vein Thrombosis - Leg (ICD-451.1) Lab Used: [facility] [place] Site: [address] INR POC 2.4 INR RANGE 2 - 3  Dietary changes: no    Health status changes: no    Bleeding/hemorrhagic complications: no    Recent/future hospitalizations: no    Any changes in medication regimen? yes       Details: amoxicillin QID x 7 days (last dose tomorrow)  Recent/future dental: no  Any missed doses?: no       Is patient compliant with meds? yes       Current Medications (verified): 1)  Warfarin Sodium 5 Mg Tabs (Warfarin Sodium) .... Use As Directed By Anticoagulation Clinic 2)  Niaspan 500 Mg  Tbcr (Niacin (Antihyperlipidemic)) .... Take 3 Tab By Mouth At Bedtime 3)  Fish Oil 1000 Mg  Caps (Omega-3 Fatty Acids) .Marland Kitchen.. 1 Qd 4)  Hydrochlorothiazide 12.5 Mg  Tabs (Hydrochlorothiazide) .Marland Kitchen.. 1 Qd 5)  Fa-Cyanocobalamin-Pyridoxine 2.05-31-23 Mg Tabs (Folic Acid-Vit B6-Vit B12) .... Take 1 Tablet By Mouth Once A Day 6)  Magnesium Oxide 250 Mg  Tabs (Magnesium Oxide) .... Take 1 Tablet By Mouth Once A Day 7)  Klor-Con M20 20 Meq Cr-Tabs (Potassium Chloride Crys Cr) .... One Tablet By Mouth Once Daily 8)  Lisinopril 10 Mg  Tabs (Lisinopril) .Marland Kitchen.. 1 By Mouth Daily 9)  Caltrate 600+d 600-400 Mg-Unit Tabs (Calcium Carbonate-Vitamin D) 10)  Nasacort Aq 55 Mcg/act Aers (Triamcinolone Acetonide(Nasal)) .Marland Kitchen.. 1 Spray Each Nostril Daily Prn  Allergies (verified): 1)  Sulfa 2)  Ceftin  Anticoagulation Management History:      The patient is taking warfarin and comes in today for a routine follow up visit.  Positive risk factors for bleeding include an age of 91 years or older.  The bleeding index is 'intermediate risk'.  Positive CHADS2 values include History of HTN.  Negative CHADS2 values  include Age > 18 years old.  The start date was 05/16/2004.  Anticoagulation responsible provider: Juanda Chance MD, Smitty Cords.  INR POC: 2.4.  Cuvette Lot#: [id number].  Exp: 12/2010.    Anticoagulation Management Assessment/Plan:      The patient's current anticoagulation dose is Warfarin sodium 5 mg tabs: Use as directed by Anticoagulation Clinic.  The target INR is 2 - 3.  The next INR is due 12/07/2009.  Anticoagulation instructions were given to patient.  Results were reviewed/authorized by Reina Fuse, PharmD.  She was notified by Reina Fuse PharmD.         Prior Anticoagulation Instructions: INR 2.2  Continue taking 1 tablet everyday except take 1/2 tablet on Mondays and Fridays. Re-check INR in 4 weeks.   Current Anticoagulation Instructions: INR 2.4  Continue taking Coumadin 1 tab (5 mg) all days except Coumadin 0.5 tab (2.5 mg) on Mondays and Fridays.   Return to clinic in 4 weeks.

## 2010-03-01 NOTE — Medication Information (Signed)
Summary: rov/sp  Anticoagulant Therapy  Managed by: Weston Brass, PharmD Referring MD: Illene Regulus PCP: Cristal Deer Supervising MD: Clifton James MD, Cristal Deer Indication 1: Deep Vein Thrombosis - Leg (ICD-451.1) Lab Used: [facility] [place] Site: [address] INR POC 2.2 INR RANGE 2 - 3  Dietary changes: no    Health status changes: no    Bleeding/hemorrhagic complications: no    Recent/future hospitalizations: no    Any changes in medication regimen? yes       Details: Started potasssium  Recent/future dental: no  Any missed doses?: no       Is patient compliant with meds? yes       Allergies: 1)  Sulfa 2)  Ceftin  Anticoagulation Management History:      The patient is taking warfarin and comes in today for a routine follow up visit.  Positive risk factors for bleeding include an age of 75 years or older.  The bleeding index is 'intermediate risk'.  Positive CHADS2 values include History of HTN.  Negative CHADS2 values include Age > 77 years old.  The start date was 05/16/2004.  Anticoagulation responsible Claudius Mich: Clifton James MD, Cristal Deer.  INR POC: 2.2.  Cuvette Lot#: [id number].  Exp: 12/2010.    Anticoagulation Management Assessment/Plan:      The patient's current anticoagulation dose is Warfarin sodium 5 mg tabs: Use as directed by Anticoagulation Clinic.  The target INR is 2 - 3.  The next INR is due 11/09/2009.  Anticoagulation instructions were given to patient.  Results were reviewed/authorized by Weston Brass, PharmD.  She was notified by Harrel Carina, PharmD candidate.         Prior Anticoagulation Instructions: INR 1.7  Take 1 tablet today then increase dose to 1 tablet every day except 1/2 tablet on Monday and Friday.  Recheck INR in 3 weeks.   Current Anticoagulation Instructions: INR 2.2  Continue taking 1 tablet everyday except take 1/2 tablet on Mondays and Fridays. Re-check INR in 4 weeks.

## 2010-03-01 NOTE — Letter (Signed)
[facility]   Imported By: Sherian Rein 07/27/2009 15:45:38  _____________________________________________________________________  External Attachment:    Type:   Image     Comment:   External Document

## 2010-03-01 NOTE — Medication Information (Signed)
Summary: rov/sl  Anticoagulant Therapy  Managed by: Leota Sauers, PharmD, BCPS, CPP Referring MD: Illene Regulus PCP: Cristal Deer Supervising MD: Eden Emms MD, Theron Arista Indication 1: Deep Vein Thrombosis - Leg (ICD-451.1) Lab Used: [facility] [place] Site: [address] INR POC 2.5 INR RANGE 2 - 3  Dietary changes: yes       Details: more greens  Health status changes: no    Bleeding/hemorrhagic complications: no    Recent/future hospitalizations: no    Any changes in medication regimen? no    Recent/future dental: no  Any missed doses?: no         Current Medications (verified): 1)  Warfarin Sodium 5 Mg Tabs (Warfarin Sodium) .... Use As Directed By Anticoagulation Clinic 2)  Niaspan 500 Mg  Tbcr (Niacin (Antihyperlipidemic)) .... Take 3 Tab By Mouth At Bedtime 3)  Fish Oil 1000 Mg  Caps (Omega-3 Fatty Acids) .Marland Kitchen.. 1 Qd 4)  Hydrochlorothiazide 12.5 Mg  Tabs (Hydrochlorothiazide) .Marland Kitchen.. 1 Qd 5)  Fa-Cyanocobalamin-Pyridoxine 2.05-31-23 Mg Tabs (Folic Acid-Vit B6-Vit B12) .... Take 1 Tablet By Mouth Once A Day 6)  Magnesium Oxide 250 Mg  Tabs (Magnesium Oxide) .... Take 1 Tablet By Mouth Once A Day 7)  Klor-Con M20 20 Meq Cr-Tabs (Potassium Chloride Crys Cr) .... One Tablet By Mouth Once Daily 8)  Lisinopril 10 Mg  Tabs (Lisinopril) .Marland Kitchen.. 1 By Mouth Daily 9)  Caltrate 600+d 600-400 Mg-Unit Tabs (Calcium Carbonate-Vitamin D) 10)  Nasacort Aq 55 Mcg/act Aers (Triamcinolone Acetonide(Nasal)) .Marland Kitchen.. 1 Spray Each Nostril Daily Prn  Allergies (verified): 1)  Sulfa 2)  Ceftin  Anticoagulation Management History:      The patient is taking warfarin and comes in today for a routine follow up visit.  Positive risk factors for bleeding include an age of 75 years or older.  The bleeding index is 'intermediate risk'.  Positive CHADS2 values include History of HTN.  Negative CHADS2 values include Age > 45 years old.  The start date was 05/16/2004.  Anticoagulation responsible  provider: Eden Emms MD, Theron Arista.  INR POC: 2.5.  Cuvette Lot#: [id number].  Exp: 12/2010.    Anticoagulation Management Assessment/Plan:      The patient's current anticoagulation dose is Warfarin sodium 5 mg tabs: Use as directed by Anticoagulation Clinic.  The target INR is 2 - 3.  The next INR is due 01/05/2010.  Anticoagulation instructions were given to patient.  Results were reviewed/authorized by Leota Sauers, PharmD, BCPS, CPP.         Prior Anticoagulation Instructions: INR 2.4  Continue taking Coumadin 1 tab (5 mg) all days except Coumadin 0.5 tab (2.5 mg) on Mondays and Fridays.   Return to clinic in 4 weeks.   Current Anticoagulation Instructions: INR 2.5  Coumadin 5mg  tabs, take 1 tab each day except 1/2 tab on Mon and Fri

## 2010-03-01 NOTE — Medication Information (Signed)
Summary: rov/ewj  Anticoagulant Therapy  Managed by: Cloyde Reams, RN, BSN Referring MD: Illene Regulus PCP: Cristal Deer Supervising MD: Shirlee Latch MD, Sandi Towe Indication 1: Deep Vein Thrombosis - Leg (ICD-451.1) Lab Used: [facility] [place] Site: [address] INR POC 2.3 INR RANGE 2 - 3  Dietary changes: no    Health status changes: no    Bleeding/hemorrhagic complications: no    Recent/future hospitalizations: no    Any changes in medication regimen? no    Recent/future dental: no  Any missed doses?: no       Is patient compliant with meds? yes       Allergies (verified): 1)  Sulfa 2)  Ceftin  Anticoagulation Management History:      The patient is taking warfarin and comes in today for a routine follow up visit.  Positive risk factors for bleeding include an age of 75 years or older.  The bleeding index is 'intermediate risk'.  Positive CHADS2 values include History of HTN.  Negative CHADS2 values include Age > 84 years old.  The start date was 05/16/2004.  Anticoagulation responsible provider: Shirlee Latch MD, Lyndel Sarate.  INR POC: 2.3.  Cuvette Lot#: [id number].  Exp: 05/2010.    Anticoagulation Management Assessment/Plan:      The patient's current anticoagulation dose is Warfarin sodium 5 mg tabs: Use as directed by Anticoagulation Clinic.  The target INR is 2 - 3.  The next INR is due 03/29/2009.  Anticoagulation instructions were given to patient.  Results were reviewed/authorized by Cloyde Reams, RN, BSN.  She was notified by Cloyde Reams RN.         Prior Anticoagulation Instructions: INR 2.5  Continue 1 tab daily except 0.5 tab on Monday, Wednesday, Friday.  Recheck in 4 weeks.    Current Anticoagulation Instructions: INR 2.3  Continue on same dosage 1 tablet daily except 1/2 tablet on Mondays, Wednesdays, and Fridays.  Recheck in 4 weeks.

## 2010-03-01 NOTE — Letter (Signed)
Summary: Handout Printed  Printed Handout:  - Coumadin Instructions-w/out Meds 

## 2010-03-01 NOTE — Medication Information (Signed)
Summary: rov/ln      Allergies Added:  Anticoagulant Therapy  Managed by: Reina Fuse, PharmD Referring MD: Illene Regulus PCP: Cristal Deer Supervising MD: Eden Emms MD, Theron Arista Indication 1: Deep Vein Thrombosis - Leg (ICD-451.1) Lab Used: [facility] [place] Site: [address] INR POC 3.0 INR RANGE 2 - 3  Dietary changes: no    Health status changes: no    Bleeding/hemorrhagic complications: no    Recent/future hospitalizations: no    Any changes in medication regimen? no    Recent/future dental: no  Any missed doses?: no       Is patient compliant with meds? yes       Current Medications (verified): 1)  Warfarin Sodium 5 Mg Tabs (Warfarin Sodium) .... Use As Directed By Anticoagulation Clinic 2)  Niaspan 500 Mg  Tbcr (Niacin (Antihyperlipidemic)) .... Take 3 Tab By Mouth At Bedtime 3)  Fish Oil 1000 Mg  Caps (Omega-3 Fatty Acids) .Marland Kitchen.. 1 Qd 4)  Hydrochlorothiazide 12.5 Mg  Tabs (Hydrochlorothiazide) .Marland Kitchen.. 1 Qd 5)  Fa-Cyanocobalamin-Pyridoxine 2.05-31-23 Mg Tabs (Folic Acid-Vit B6-Vit B12) .... Take 1 Tablet By Mouth Once A Day 6)  Magnesium Oxide 250 Mg  Tabs (Magnesium Oxide) .... Take 1 Tablet By Mouth Once A Day 7)  Klor-Con M20 20 Meq Cr-Tabs (Potassium Chloride Crys Cr) .... One Tablet By Mouth Once Daily 8)  Lisinopril 10 Mg  Tabs (Lisinopril) .Marland Kitchen.. 1 By Mouth Daily 9)  Caltrate 600+d 600-400 Mg-Unit Tabs (Calcium Carbonate-Vitamin D) 10)  Nasacort Aq 55 Mcg/act Aers (Triamcinolone Acetonide(Nasal)) .Marland Kitchen.. 1 Spray Each Nostril Daily Prn  Allergies (verified): 1)  Sulfa 2)  Ceftin  Anticoagulation Management History:      The patient is taking warfarin and comes in today for a routine follow up visit.  Positive risk factors for bleeding include an age of 29 years or older.  The bleeding index is 'intermediate risk'.  Positive CHADS2 values include History of HTN.  Negative CHADS2 values include Age > 45 years old.  The start date was 05/16/2004.   Anticoagulation responsible provider: Eden Emms MD, Theron Arista.  INR POC: 3.0.  Cuvette Lot#: [id number].  Exp: 10/2010.    Anticoagulation Management Assessment/Plan:      The patient's current anticoagulation dose is Warfarin sodium 5 mg tabs: Use as directed by Anticoagulation Clinic.  The target INR is 2 - 3.  The next INR is due 09/20/2009.  Anticoagulation instructions were given to patient.  Results were reviewed/authorized by Reina Fuse, PharmD.  She was notified by Reina Fuse PharmD.         Prior Anticoagulation Instructions: INR 1.7  Take 1.5 tabs today and then change to 1 tab daily except for 1/2 tab on Monday and Friday.  Re-check INR in 2 weeks.    Current Anticoagulation Instructions: INR 3.0  Coumadin 1 tab on Sun, Tues, Thur, and Saturday. Coumadin 0.5 tab on Mon, Wed, Fri.

## 2010-03-01 NOTE — Medication Information (Signed)
Summary: rov/tm  Anticoagulant Therapy  Managed by: Bethena Midget, RN, BSN Referring MD: Illene Regulus PCP: Cristal Deer Supervising MD: Graciela Husbands MD, Viviann Spare Indication 1: Deep Vein Thrombosis - Leg (ICD-451.1) Lab Used: [facility] [place] Site: [address] INR POC 1.7 INR RANGE 2 - 3  Dietary changes: no    Health status changes: no    Bleeding/hemorrhagic complications: no    Recent/future hospitalizations: no    Any changes in medication regimen? no    Recent/future dental: no  Any missed doses?: no       Is patient compliant with meds? yes       Allergies: 1)  Sulfa 2)  Ceftin  Anticoagulation Management History:      The patient is taking warfarin and comes in today for a routine follow up visit.  Positive risk factors for bleeding include an age of 75 years or older.  The bleeding index is 'intermediate risk'.  Positive CHADS2 values include History of HTN.  Negative CHADS2 values include Age > 70 years old.  The start date was 05/16/2004.  Anticoagulation responsible provider: Graciela Husbands MD, Viviann Spare.  INR POC: 1.7.  Cuvette Lot#: [id number].  Exp: 10/2010.    Anticoagulation Management Assessment/Plan:      The patient's current anticoagulation dose is Warfarin sodium 5 mg tabs: Use as directed by Anticoagulation Clinic.  The target INR is 2 - 3.  The next INR is due 08/31/2009.  Anticoagulation instructions were given to patient.  Results were reviewed/authorized by Bethena Midget, RN, BSN.  She was notified by Dillard Cannon.         Prior Anticoagulation Instructions: INR 1.9 Today take 1 1/2 pills then continue 1 pill everyday except 1/2 pill on Mondays, Wednesdays and Fridays. Recheck in 4 weeks.   Current Anticoagulation Instructions: INR 1.7  Take 1.5 tabs today and then change to 1 tab daily except for 1/2 tab on Monday and Friday.  Re-check INR in 2 weeks.

## 2010-03-01 NOTE — Medication Information (Signed)
Summary: ccr/jss  Anticoagulant Therapy  Managed by: Weston Brass, PharmD Referring MD: Illene Regulus PCP: Cristal Deer Supervising MD: Juanda Chance MD, Broxton Broady Indication 1: Deep Vein Thrombosis - Leg (ICD-451.1) Lab Used: [facility] [place] Site: [address] INR POC 2.5 INR RANGE 2 - 3  Dietary changes: no    Health status changes: no    Bleeding/hemorrhagic complications: no    Recent/future hospitalizations: no    Any changes in medication regimen? no    Recent/future dental: no  Any missed doses?: no       Is patient compliant with meds? yes       Allergies: 1)  Sulfa 2)  Ceftin  Anticoagulation Management History:      The patient is taking warfarin and comes in today for a routine follow up visit.  Positive risk factors for bleeding include an age of 75 years or older.  The bleeding index is 'intermediate risk'.  Positive CHADS2 values include History of HTN.  Negative CHADS2 values include Age > 19 years old.  The start date was 05/16/2004.  Anticoagulation responsible provider: Juanda Chance MD, Smitty Cords.  INR POC: 2.5.  Cuvette Lot#: [id number].  Exp: 07/2010.    Anticoagulation Management Assessment/Plan:      The patient's current anticoagulation dose is Warfarin sodium 5 mg tabs: Use as directed by Anticoagulation Clinic.  The target INR is 2 - 3.  The next INR is due 06/21/2009.  Anticoagulation instructions were given to patient.  Results were reviewed/authorized by Weston Brass, PharmD.  She was notified by Weston Brass PharmD.         Prior Anticoagulation Instructions: INR 1.9  Take 1 tablet today.  Then return to normal dosing schedule of 1/2 tablet on Monday, Wednesday, and friday and 1 tablet all other days.  Return to clinic in 4 weeks.   Current Anticoagulation Instructions: INR 2.5  Continue same dose of 1 tablet every day except 1/2 tablet on Monday, Wednesday and Friday

## 2010-03-01 NOTE — Assessment & Plan Note (Signed)
Summary: f/u appt/#/cd   Vital Signs:  Patient profile:   77 year old female Height:      64 inches Weight:      165 pounds BMI:     28.42 O2 Sat:      93 % on Room air Temp:     98.3 degrees F oral Pulse rate:   72 / minute Resp:     16 per minute BP sitting:   110 / 80  (left arm) Cuff size:   regular  Vitals Entered By: Misty Stanley Simmons,CMA  O2 Flow:  Room air CC: f/u  Comments pt is not taking Arimidex   Primary Care Provider:  Micheal Norins,MD  CC:  f/u .  History of Present Illness: Patient last seen Nov '09. In the interval she was diagnosed with T1N0 invasive ductal carcinoma of the right breast., She underwent lumpectomy by Dr. Jamey Ripa with follow-up XRT with Dr. Roselind Messier. She follows with Dr. Darnelle Catalan. She was intolerant of armidex and Femara. She has done well. Most recent visit to Dr. Sharol Roussel March '11 and released. Last viist to Dr. Darnelle Catalan in June '11 - note pending. She did have lab at the [facility]: CBC normal; CMet and LFTs normal.  She has been stable from a cardiac perspective. Last seen for cardiac evaluation in February '10 with a NST March '10 that was a normal study. Her blood pressure has been well controlled. She attends coag clinic routinely. Last MRA for monitoring ascending aortic aneurysm was March '09 - stable with no increase in diameter.  Her last DXA scan was October 28, '10 - ostepenia noted spine and one hip.   Current Medications (verified): 1)  Warfarin Sodium 5 Mg Tabs (Warfarin Sodium) .... Use As Directed By Anticoagulation Clinic 2)  Niaspan 500 Mg  Tbcr (Niacin (Antihyperlipidemic)) .... Take 3 Tab By Mouth At Bedtime 3)  Fish Oil 1000 Mg  Caps (Omega-3 Fatty Acids) .Marland Kitchen.. 1 Qd 4)  Hydrochlorothiazide 12.5 Mg  Tabs (Hydrochlorothiazide) .Marland Kitchen.. 1 Qd 5)  Fa-Cyanocobalamin-Pyridoxine 2.05-31-23 Mg Tabs (Folic Acid-Vit B6-Vit B12) .... Take 1 Tablet By Mouth Once A Day 6)  Magnesium Oxide 250 Mg  Tabs (Magnesium Oxide) .... Take 1 Tablet By Mouth Once A  Day 7)  Potassium Gluconate 595 Mg  Tabs (Potassium Gluconate) .... Take 1 Tablet By Mouth Once A Day 8)  Lisinopril 10 Mg  Tabs (Lisinopril) .Marland Kitchen.. 1 By Mouth Daily 9)  Caltrate 600+d 600-400 Mg-Unit Tabs (Calcium Carbonate-Vitamin D) 10)  Metoprolol Succinate 25 Mg Xr24h-Tab (Metoprolol Succinate) .... Take One Tablet By Mouth Every 8 Hours As Needed 11)  Arimidex 1 Mg Tabs (Anastrozole) .... Take 1 Tablet Daily. 12)  Nasacort Aq 55 Mcg/act Aers (Triamcinolone Acetonide(Nasal)) .Marland Kitchen.. 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) INFILTRATING DUCTAL CARCINOMA, RIGHT BREAST, STAGE I (ICD-174.9) ACUTE GASTRITIS WITHOUT MENTION OF HEMORRHAGE (ICD-535.00) PHLEBITIS&THROMBOPHLEB SUP VESSELS LOWER EXTREM (ICD-451.0) HEMORRHOIDS (ICD-455.6) COLONIC POLYPS, HX OF (ICD-V12.72) TMJ PAIN (ICD-524.62) UNSPECIFIED ESSENTIAL HYPERTENSION (ICD-401.9) VENOUS INSUFFICIENCY, LEGS (ICD-459.81) OTHER AND UNSPECIFIED HYPERLIPIDEMIA (ICD-272.4) PAROXYSMAL SUPRAVENTRICULAR TACHYCARDIA (ICD-427.0) ANEURYSM, 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 Jamey Ripa)  Family History: positive for cardiovascular disease in parents and uncle. Family History of Clotting disorder:  uncle No FH of Colon Cancer, DM Father - deceased @74 : CVA, CAD Mother - deceased @  73: CVA, CAD, HTN  Social History: married 1955 2 sons- '59, '61, 2 daughters-'55, '57; 8 grandchildren; 1 great-grand I-ADLs  Occupation: retired Patient has never smoked.  Alcohol Use - no Illicit Drug Use - no  Review of Systems       The patient complains of vision loss and decreased hearing.  The patient denies anorexia, fever, weight loss, weight gain, chest pain, syncope, dyspnea on exertion, peripheral edema,  prolonged cough, hemoptysis, abdominal pain, incontinence, muscle weakness, suspicious skin lesions, difficulty walking, depression, abnormal bleeding, and enlarged lymph nodes.         Macular degenration left eye  Physical Exam  General:  WNWD whit female in no distress Head:  Normocephalic and atraumatic without obvious abnormalities. No apparent alopecia or balding. Eyes:  pupils equal and pupils round.  Remainder of exam deferred to Dr. Nile Riggs Ears:  R ear normal and L ear normal.   Nose:  no external deformity and no external erythema.   Mouth:  Oral mucosa and oropharynx without lesions or exudates.  Teeth in good repair. Neck:  supple, full ROM, no thyromegaly, and no carotid bruits.   Chest Wall:  no deformities.   Breasts:  deferred to oncology and surgery Lungs:  Normal respiratory effort, chest expands symmetrically. Lungs are clear to auscultation, no crackles or wheezes. Heart:  Normal rate and regular rhythm. S1 and S2 normal without gallop, murmur, click, rub or other extra sounds. Abdomen:  soft, non-tender, normal bowel sounds, no guarding, and no hepatomegaly.   Msk:  normal ROM, no joint tenderness, no joint swelling, and no joint warmth.   Pulses:  2+ radial, 1+ DP Extremities:  No clubbing, cyanosis, edema, or deformity noted with normal full range of motion of all joints.   Neurologic:  alert & oriented X3, cranial nerves II-XII intact, strength normal in all extremities, gait normal, and DTRs symmetrical and normal.   Skin:  turgor normal, color normal, no rashes, no purpura, and no ulcerations.   Cervical Nodes:  no anterior cervical adenopathy and no posterior cervical adenopathy.   Psych:  Oriented X3, memory intact for recent and remote, normally interactive, and good eye contact.     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 she remains on coumadin and is monitored in the coag  clinic.  Problem # 2:  INFILTRATING DUCTAL CARCINOMA, RIGHT BREAST, STAGE I (ICD-174.9) She is almost 2 years out from excisonal lumpectomy. By her report most recent mammogram was normal. She has complete XRT and is on no medical therapy at this time.   Plan - continued follow-up with Dr. Darnelle Catalan.  Problem # 3:  COLONIC POLYPS, HX OF (ICD-V12.72) Last colonoscopy Nov '09 - normal. She is due for follow-up no earlier than 2014.  Problem # 4:  UNSPECIFIED ESSENTIAL HYPERTENSION (ICD-401.9)  Her updated medication list for this problem includes:    Hydrochlorothiazide 12.5 Mg Tabs (Hydrochlorothiazide) .Marland Kitchen... 1 qd    Lisinopril 10 Mg Tabs (Lisinopril) .Marland Kitchen... 1 by mouth daily    Metoprolol Succinate 25 Mg Xr24h-tab (Metoprolol succinate) .Marland Kitchen... Take one tablet by mouth every 8 hours as needed  BP today: 110/80 Prior BP: 130/76 (02/04/2008)  Good control and at goal for BP level in regard to her anuerysm.  Problem # 5:  VENOUS INSUFFICIENCY, LEGS (ICD-459.81) on exam today no significant LE edema. She does continue to use support hose.  Problem # 6:  OTHER  AND UNSPECIFIED HYPERLIPIDEMIA (ICD-272.4) Due for lab today with recommendations to follow.  Her updated medication list for this problem includes:    Niaspan 500 Mg Tbcr (Niacin (antihyperlipidemic)) .Marland Kitchen... Take 3 tab by mouth at bedtime  Orders: TLB-Lipid Panel (80061-LIPID) TLB-Hepatic/Liver Function Pnl (80076-HEPATIC) TLB-TSH (Thyroid Stimulating Hormone) (84443-TSH)  Addendum - LDL 158 - too high.  Triglycerides ok.  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 She has been maintaining a sound controlled BP. She has had no discomfort.   Plan - watchful waiting with no need for imaging study at this time.  Problem # 8:  Preventive Health Care (ICD-V70.0) No active complaints at today's visit. Limited physical exam is normal. OUtside labs results reviewed and are normal. She is current with  colorectal cancer screening. She is given pneumovax booster today since her first immunization was at age 36.   She is in good spirits and is thankful for her good fortune as a survivor, i.e. no evidence of depression. She remains 100% independent in ADLs and has no fall or injury risk beyond the norm for her age.  In summary - a delightfull woman who appears to be medically stable at this time.   Complete Medication List: 1)  Warfarin Sodium 5 Mg Tabs (Warfarin sodium) .... Use as directed by anticoagulation clinic 2)  Niaspan 500 Mg Tbcr (Niacin (antihyperlipidemic)) .... Take 3 tab by mouth at bedtime 3)  Fish Oil 1000 Mg Caps (Omega-3 fatty acids) .Marland Kitchen.. 1 qd 4)  Hydrochlorothiazide 12.5 Mg Tabs (Hydrochlorothiazide) .Marland Kitchen.. 1 qd 5)  Fa-cyanocobalamin-pyridoxine 2.05-31-23 Mg Tabs (Folic acid-vit b6-vit b12) .... Take 1 tablet by mouth once a day 6)  Magnesium Oxide 250 Mg Tabs (magnesium Oxide)  .... Take 1 tablet by mouth once a day 7)  Potassium Gluconate 595 Mg Tabs (Potassium gluconate) .... Take 1 tablet by mouth once a day 8)  Lisinopril 10 Mg Tabs (Lisinopril) .Marland Kitchen.. 1 by mouth daily 9)  Caltrate 600+d 600-400 Mg-unit Tabs (Calcium carbonate-vitamin d) 10)  Metoprolol Succinate 25 Mg Xr24h-tab (Metoprolol succinate) .... Take one tablet by mouth every 8 hours as needed 11)  Arimidex 1 Mg Tabs (Anastrozole) .... Take 1 tablet daily. 12)  Nasacort Aq 55 Mcg/act Aers (Triamcinolone acetonide(nasal)) .Marland Kitchen.. 1 spray each nostril daily  Other Orders: Pneumococcal Vaccine (04540) Admin 1st Vaccine (98119)  Patient: Katina Degree Note: All result statuses are Final unless otherwise noted.  Tests: (1) Lipid Panel (LIPID)   Cholesterol          [H]  221 mg/dL                   1-478     ATP III Classification            Desirable:  < 200 mg/dL                    Borderline High:  200 - 239 mg/dL               High:  > = 240 mg/dL   Triglycerides             69.0 mg/dL                   2.9-562.1     Normal:  <150 mg/dL     Borderline High:  308 - 199 mg/dL   HDL  56.40 mg/dL                 >13.08   VLDL Cholesterol          13.8 mg/dL                  6.5-78.4  CHO/HDL Ratio:  CHD Risk                             4                    Men          Women     1/2 Average Risk     3.4          3.3     Average Risk          5.0          4.4     2X Average Risk          9.6          7.1     3X Average Risk          15.0          11.0                           Tests: (2) Hepatic/Liver Function Panel (HEPATIC)   Total Bilirubin           0.8 mg/dL                   6.9-6.2   Direct Bilirubin          0.2 mg/dL                   9.5-2.8   Alkaline Phosphatase      56 U/L                      39-117   AST                       23 U/L                      0-37   ALT                       15 U/L                      0-35   Total Protein             7.9 g/dL                    4.1-3.2   Albumin                   4.2 g/dL                    4.4-0.1  Tests: (3) TSH (TSH)   FastTSH                   2.48 uIU/mL                 0.35-5.50  Tests: (4) Cholesterol LDL - Direct (DIRLDL)  Cholesterol LDL - Direct  158.8 mg/dL     Optimal:  <161 mg/dL     Near or Above Optimal:  100-129 mg/dL     Borderline High:  096-045 mg/dL     High:  409-811 mg/dL     Very High:  >914 mg/dL  Immunizations Administered:  Pneumonia Vaccine:    Vaccine Type: Pneumovax    Site: right deltoid    Mfr: Merck    Dose: 0.5 ml    Route: IM    Given by: Ami Bullins CMA    Exp. Date: 11/24/2010    Lot #: [id number]    VIS given: 08/28/95 version given July 27, 2009.

## 2010-03-01 NOTE — Progress Notes (Signed)
  Phone Note Refill Request Message from:  Fax from Pharmacy on April 09, 2009 8:54 AM  Refills Requested: Medication #1:  LISINOPRIL 10 MG  TABS 1 by mouth daily Initial call taken by: Ami Bullins CMA,  April 09, 2009 8:54 AM    Prescriptions: LISINOPRIL 10 MG  TABS (LISINOPRIL) 1 by mouth daily  #30 x 2   Entered by:   Ami Bullins CMA   Authorized by:   Jacques Navy MD   Signed by:   Bill Salinas CMA on 04/09/2009   Method used:   Electronically to        [id number]       [address] [place], [place]  [address]       Ph: [phone] or [phone]       Fax: [phone]   RxID:   [id number]

## 2010-03-01 NOTE — Medication Information (Signed)
Summary: rov/sl  Anticoagulant Therapy  Managed by: Weston Brass, PharmD Referring MD: Illene Regulus PCP: Cristal Deer Supervising MD: Antoine Poche MD, Fayrene Fearing Indication 1: Deep Vein Thrombosis - Leg (ICD-451.1) Lab Used: [facility] [place] Site: [address] INR POC 1.7 INR RANGE 2 - 3  Dietary changes: no    Health status changes: no    Bleeding/hemorrhagic complications: no    Recent/future hospitalizations: no    Any changes in medication regimen? no    Recent/future dental: no  Any missed doses?: no       Is patient compliant with meds? yes       Allergies: 1)  Sulfa 2)  Ceftin  Anticoagulation Management History:      The patient is taking warfarin and comes in today for a routine follow up visit.  Positive risk factors for bleeding include an age of 75 years or older.  The bleeding index is 'intermediate risk'.  Positive CHADS2 values include History of HTN.  Negative CHADS2 values include Age > 38 years old.  The start date was 05/16/2004.  Anticoagulation responsible provider: Antoine Poche MD, Fayrene Fearing.  INR POC: 1.7.  Cuvette Lot#: [id number].  Exp: 10/2010.    Anticoagulation Management Assessment/Plan:      The patient's current anticoagulation dose is Warfarin sodium 5 mg tabs: Use as directed by Anticoagulation Clinic.  The target INR is 2 - 3.  The next INR is due 10/12/2009.  Anticoagulation instructions were given to patient.  Results were reviewed/authorized by Weston Brass, PharmD.  She was notified by Weston Brass PharmD.         Prior Anticoagulation Instructions: INR 3.0  Coumadin 1 tab on Sun, Tues, Thur, and Saturday. Coumadin 0.5 tab on Mon, Wed, Fri.  Current Anticoagulation Instructions: INR 1.7  Take 1 tablet today then increase dose to 1 tablet every day except 1/2 tablet on Monday and Friday.  Recheck INR in 3 weeks.

## 2010-03-01 NOTE — Medication Information (Signed)
Summary: rov/eac  Anticoagulant Therapy  Managed by: Bethena Midget, RN, BSN Referring MD: Illene Regulus PCP: Cristal Deer Supervising MD: Shirlee Latch MD, Chancy Claros Indication 1: Deep Vein Thrombosis - Leg (ICD-451.1) Lab Used: [facility] [place] Site: [address] INR POC 1.9 INR RANGE 2 - 3  Dietary changes: yes       Details: Has been eating extra green leafy veggies  Health status changes: no    Bleeding/hemorrhagic complications: no    Recent/future hospitalizations: no    Any changes in medication regimen? no    Recent/future dental: no  Any missed doses?: no       Is patient compliant with meds? yes       Allergies: 1)  Sulfa 2)  Ceftin  Anticoagulation Management History:      The patient is taking warfarin and comes in today for a routine follow up visit.  Positive risk factors for bleeding include an age of 27 years or older.  The bleeding index is 'intermediate risk'.  Positive CHADS2 values include History of HTN.  Negative CHADS2 values include Age > 39 years old.  The start date was 05/16/2004.  Anticoagulation responsible provider: Shirlee Latch MD, Marlyce Mcdougald.  INR POC: 1.9.  Cuvette Lot#: [id number].  Exp: 10/2010.    Anticoagulation Management Assessment/Plan:      The patient's current anticoagulation dose is Warfarin sodium 5 mg tabs: Use as directed by Anticoagulation Clinic.  The target INR is 2 - 3.  The next INR is due 08/17/2009.  Anticoagulation instructions were given to patient.  Results were reviewed/authorized by Bethena Midget, RN, BSN.  She was notified by Bethena Midget, RN, BSN.         Prior Anticoagulation Instructions: INR 2.3  Continue taking 1/2 tablet on Monday, Wednesday, and Friday and 1 tablet all other days.  Return to clinic in 4 weeks.    Current Anticoagulation Instructions: INR 1.9 Today take 1 1/2 pills then continue 1 pill everyday except 1/2 pill on Mondays, Wednesdays and Fridays. Recheck in 4 weeks.

## 2010-03-01 NOTE — Assessment & Plan Note (Signed)
Summary: ov to discuss niaspan and pain/ ab   Vital Signs:  Patient profile:   77 year old female Height:      64 inches Weight:      165 pounds BMI:     28.42 O2 Sat:      95 % on Room air Temp:     97.8 degrees F oral Pulse rate:   62 / minute BP sitting:   138 / 72  (left arm) Cuff size:   regular  Vitals Entered By: Bill Salinas CMA (November 26, 2009 11:44 AM)  O2 Flow:  Room air CC: pt here to discuss shoulder pain and niaspan/ ab   Primary Care Provider:  Micheal Carless Slatten,MD  CC:  pt here to discuss shoulder pain and niaspan/ ab.  History of Present Illness: Patient presents due to pain that occurs in the upper back and neck which is worse at night. she is concerned that this is due to niaspan. She has tried missing a dose of niaspan and thediscomfort was less. She is advised that this would be an unusual presentation of medication reaction.  Current Medications (verified): 1)  Warfarin Sodium 5 Mg Tabs (Warfarin Sodium) .... Use As Directed By Anticoagulation Clinic 2)  Niaspan 500 Mg  Tbcr (Niacin (Antihyperlipidemic)) .... Take 3 Tab By Mouth At Bedtime 3)  Fish Oil 1000 Mg  Caps (Omega-3 Fatty Acids) .Marland Kitchen.. 1 Qd 4)  Hydrochlorothiazide 12.5 Mg  Tabs (Hydrochlorothiazide) .Marland Kitchen.. 1 Qd 5)  Fa-Cyanocobalamin-Pyridoxine 2.05-31-23 Mg Tabs (Folic Acid-Vit B6-Vit B12) .... Take 1 Tablet By Mouth Once A Day 6)  Magnesium Oxide 250 Mg  Tabs (Magnesium Oxide) .... Take 1 Tablet By Mouth Once A Day 7)  Klor-Con M20 20 Meq Cr-Tabs (Potassium Chloride Crys Cr) .... One Tablet By Mouth Once Daily 8)  Lisinopril 10 Mg  Tabs (Lisinopril) .Marland Kitchen.. 1 By Mouth Daily 9)  Caltrate 600+d 600-400 Mg-Unit Tabs (Calcium Carbonate-Vitamin D) 10)  Nasacort Aq 55 Mcg/act Aers (Triamcinolone Acetonide(Nasal)) .Marland Kitchen.. 1 Spray Each Nostril Daily Prn  Allergies (verified): 1)  Sulfa 2)  Ceftin  Past History:  Past Medical History: Last updated: 08/26/2009 ANEURYSM, THORACIC AORTIC PAROXYSMAL  SUPRAVENTRICULAR TACHYCARDIA FACTOR V DEFICIENCY INFILTRATING DUCTAL CARCINOMA, RIGHT BREAST, STAGE I ACUTE GASTRITIS WITHOUT MENTION OF HEMORRHAGE PHLEBITIS&THROMBOPHLEB SUP VESSELS LOWER EXTREM  HEMORRHOIDS COLONIC POLYPS, HX OF  TMJ PAIN UNSPECIFIED ESSENTIAL HYPERTENSION VENOUS INSUFFICIENCY, LEGS OTHER AND UNSPECIFIED HYPERLIPIDEMIA  DVT, HX OF  Past Surgical History: Last updated: 07/27/2009 Total hip replacement-right '99 Hysterectomy Splenectomy-'80s Right breast Lumpectomy '10 (Dr. Jamey Ripa) FH reviewed for relevance, SH/Risk Factors reviewed for relevance  Review of Systems  The patient denies anorexia, fever, weight loss, chest pain, syncope, dyspnea on exertion, abdominal pain, hematochezia, muscle weakness, and suspicious skin lesions.    Physical Exam  General:  alert, well-developed, and well-nourished.   Head:  normocephalic and atraumatic.   Eyes:  pupils equal, pupils round, and corneas and lenses clear.   Neck:  supple.  Tender at the left trapezius group and the rhomboids. No mass or lesion. Lungs:  normal respiratory effort.   Heart:  normal rate and regular rhythm.   Msk:  normal ROM and no joint tenderness.   Pulses:  2+ radial Neurologic:  alert & oriented X3, cranial nerves II-XII intact, strength normal in all extremities, and gait normal.   Skin:  turgor normal and color normal.   Cervical Nodes:  no anterior cervical adenopathy and no posterior cervical adenopathy.   Psych:  normally  interactive, good eye contact, and not anxious appearing.     Impression & Recommendations:  Problem # 1:  NECK PAIN, LEFT (ICD-723.1) Patient with neck and upper back pain that is palpable. It does not appear to be c/w drug reaction.  Plan - lineament of choice and APAP          May take a drug holiday from niaspan to see if this is the culprit   Complete Medication List: 1)  Warfarin Sodium 5 Mg Tabs (Warfarin sodium) .... Use as directed by anticoagulation  clinic 2)  Niaspan 500 Mg Tbcr (Niacin (antihyperlipidemic)) .... Take 3 tab by mouth at bedtime 3)  Fish Oil 1000 Mg Caps (Omega-3 fatty acids) .Marland Kitchen.. 1 qd 4)  Hydrochlorothiazide 12.5 Mg Tabs (Hydrochlorothiazide) .Marland Kitchen.. 1 qd 5)  Fa-cyanocobalamin-pyridoxine 2.05-31-23 Mg Tabs (Folic acid-vit b6-vit b12) .... Take 1 tablet by mouth once a day 6)  Magnesium Oxide 250 Mg Tabs (magnesium Oxide)  .... Take 1 tablet by mouth once a day 7)  Klor-con M20 20 Meq Cr-tabs (Potassium chloride crys cr) .... One tablet by mouth once daily 8)  Lisinopril 10 Mg Tabs (Lisinopril) .Marland Kitchen.. 1 by mouth daily 9)  Caltrate 600+d 600-400 Mg-unit Tabs (Calcium carbonate-vitamin d) 10)  Nasacort Aq 55 Mcg/act Aers (Triamcinolone acetonide(nasal)) .Marland Kitchen.. 1 spray each nostril daily prn  Other Orders: Flu Vaccine 45yrs + MEDICARE PATIENTS (E4540) Administration Flu vaccine - MCR (J8119) Prescriptions: FA-CYANOCOBALAMIN-PYRIDOXINE 2.05-31-23 MG TABS (FOLIC ACID-VIT B6-VIT B12) Take 1 tablet by mouth once a day  #30 Tablet x 12   Entered and Authorized by:   Jacques Navy MD   Signed by:   Jacques Navy MD on 11/26/2009   Method used:   [facility]        [facility]  [address] [phone]       [address] [place], [place]  [address]       Ph: [phone] or [phone]       Fax: [phone]   RxID:   [id number]    Orders Added: 1)  Flu Vaccine 48yrs + MEDICARE PATIENTS [Q2039] 2)  Administration Flu vaccine - MCR [G0008] 3)  Est. Patient Level III [87564]   Flu Vaccine Consent Questions     Do you have a history of severe allergic reactions to this vaccine? no    Any prior history of allergic reactions to egg and/or gelatin? no    Do you have a sensitivity to the preservative Thimersol? no    Do you have a past history of Guillan-Barre Syndrome? no    Do you currently have an acute febrile illness? no    Have you ever had a severe reaction to latex? no    Vaccine information given  and explained to patient? yes    Are you currently pregnant? no    Lot Number:[id number]   Exp Date:07/30/2010   Site Given  Left Deltoid IMCCC]        .[email or web address]

## 2010-03-01 NOTE — Letter (Signed)
   [facility] [address] [place], [place]  [address] Phone: [phone]      December 26, 2009   [place] [address] [place], [place] [address]  RE:  LAB RESULTS  Dear  Ms. Sample,  The following is an interpretation of your most recent lab tests.  Please take note of any instructions provided or changes to medications that have resulted from your lab work.   Bone density study from November 3rd reveals mild bone loss, osteopenia, at the left hip. Right hip and spine are normal.  Contact the office if you wish more detailed information or a copy of the report.    Sincerely Yours,    Jacques Navy MD

## 2010-03-01 NOTE — Progress Notes (Signed)
Summary: CALL  Phone Note Call from Patient Call back at Home Phone [phone]   Summary of Call: Patient is requesting a call back regarding a foot problem she has.  Initial call taken by: Lamar Sprinkles, CMA,  January 04, 2010 2:52 PM  Follow-up for Phone Call        # busy.............Marland KitchenLamar Sprinkles, CMA  January 04, 2010 4:18 PM   Scheduled for office visit tomorrow Follow-up by: Lamar Sprinkles, CMA,  January 04, 2010 6:38 PM

## 2010-03-01 NOTE — Medication Information (Signed)
Summary: rov/ewj  Anticoagulant Therapy  Managed by: Cloyde Reams, RN, BSN Referring MD: Illene Regulus PCP: Cristal Deer Supervising MD: Shirlee Latch MD, Brianni Manthe Indication 1: Deep Vein Thrombosis - Leg (ICD-451.1) Lab Used: [facility] [place] Site: [address] INR POC 1.9 INR RANGE 2 - 3  Dietary changes: no    Health status changes: no    Bleeding/hemorrhagic complications: no    Recent/future hospitalizations: no    Any changes in medication regimen? no    Recent/future dental: no  Any missed doses?: no       Is patient compliant with meds? yes      Comments: On Friday had cyst removed on from [place]. [place] on thumb.   Allergies: 1)  Sulfa 2)  Ceftin  Anticoagulation Management History:      The patient is taking warfarin and comes in today for a routine follow up visit.  Positive risk factors for bleeding include an age of 37 years or older.  The bleeding index is 'intermediate risk'.  Positive CHADS2 values include History of HTN.  Negative CHADS2 values include Age > 69 years old.  The start date was 05/16/2004.  Anticoagulation responsible provider: Shirlee Latch MD, Demarcus Thielke.  INR POC: 1.9.  Cuvette Lot#: [id number].  Exp: 05/2010.    Anticoagulation Management Assessment/Plan:      The patient's current anticoagulation dose is Warfarin sodium 5 mg tabs: Use as directed by Anticoagulation Clinic.  The target INR is 2 - 3.  The next INR is due 04/26/2009.  Anticoagulation instructions were given to patient.  Results were reviewed/authorized by Cloyde Reams, RN, BSN.  She was notified by Cloyde Reams RN.         Prior Anticoagulation Instructions: INR 2.3  Continue on same dosage 1 tablet daily except 1/2 tablet on Mondays, Wednesdays, and Fridays.  Recheck in 4 weeks.    Current Anticoagulation Instructions: INR 1.9 Today take 5mg s then resume 5mg s  daily except 2.5mg s on Mondays, Wednesdays and Fridays. Recheck in 4 weeks.

## 2010-03-01 NOTE — Progress Notes (Signed)
Summary: pt needs refill   Phone Note Refill Request Message from:  [facility] [phone]  Refills Requested: Medication #1:  KLOR-CON M20 20 MEQ CR-TABS one tablet by mouth once daily Initial call taken by: Omer Jack,  November 01, 2009 12:22 PM    Prescriptions: KLOR-CON M20 20 MEQ CR-TABS (POTASSIUM CHLORIDE CRYS CR) one tablet by mouth once daily  #30 x 12   Entered by:   Kem Parkinson   Authorized by:   Ferman Hamming, MD, [facility]   Signed by:   Kem Parkinson on 11/02/2009   Method used:   [facility]  [address] [phone]       [address] [place], [place]  [address]       Ph: [phone] or [phone]       Fax: [phone]   RxID:   [id number]

## 2010-03-03 NOTE — Medication Information (Signed)
Summary: rov/sp   Anticoagulant Therapy  Managed by: Weston Brass, PharmD Referring MD: Illene Regulus PCP: Cristal Deer Supervising MD: Jens Som MD, Arlys John Indication 1: Deep Vein Thrombosis - Leg (ICD-451.1) Lab Used: [facility] [place] Site: [address] INR POC 1.9 INR RANGE 2 - 3  Dietary changes: no    Health status changes: no    Bleeding/hemorrhagic complications: no    Recent/future hospitalizations: no    Any changes in medication regimen? no    Recent/future dental: yes     Details: Having tooth pulled today.  Needs INR  ~2   Any missed doses?: no       Is patient compliant with meds? yes       Allergies: 1)  Sulfa 2)  Ceftin  Anticoagulation Management History:      The patient is taking warfarin and comes in today for a routine follow up visit.  Positive risk factors for bleeding include an age of 75 years or older.  The bleeding index is 'intermediate risk'.  Positive CHADS2 values include History of HTN.  Negative CHADS2 values include Age > 9 years old.  The start date was 05/16/2004.  Anticoagulation responsible provider: Jens Som MD, Arlys John.  INR POC: 1.9.  Cuvette Lot#: [id number].  Exp: 03/2011.    Anticoagulation Management Assessment/Plan:      The patient's current anticoagulation dose is Warfarin sodium 5 mg tabs: Use as directed by Anticoagulation Clinic.  The target INR is 2 - 3.  The next INR is due 03/01/2010.  Anticoagulation instructions were given to patient.  Results were reviewed/authorized by Weston Brass, PharmD.  She was notified by Weston Brass PharmD.         Prior Anticoagulation Instructions: INR 2.7  Skip today's dose of Coumadin then resume same dose of 1 tablet every day except 1/2 tablet on Monday and Friday.  Eat extra greens on Sunday and Monday.  Recheck INR in 1 week.   Current Anticoagulation Instructions: INR 1.9  Continue same dose of 1 tablet every day except 1/2 tablet on Monday and Friday.  Recheck INR  in 4 weeks.

## 2010-03-03 NOTE — Progress Notes (Signed)
Summary: U/a Results ready  Phone Note Other Incoming   Caller: [name] Little Summary of Call: [name] Little calling she states she has had urinary freq. and discomfort for 3 days, [name] Little wants to go to the lab to have a UA today. Can she go to the lab to have this done or do you want her to come in for office visit Initial call taken by: Ami Bullins CMA,  February 24, 2010 8:45 AM  Follow-up for Phone Call        OK to go to lab for U/a 595.0 Follow-up by: Jacques Navy MD,  February 24, 2010 9:20 AM  Additional Follow-up for Phone Call Additional follow up Details #1::        [name] Little informed, HOLD PHONE NOTE OPEN AND WAIT FOR RESULTS Additional Follow-up by: Lamar Sprinkles, CMA,  February 24, 2010 11:06 AM    Additional Follow-up for Phone Call Additional follow up Details #2::    weakly positive U/A  ok for cipro 250mg  two times a day x 5 days.  Follow-up by: Jacques Navy MD,  February 24, 2010 5:17 PM  Additional Follow-up for Phone Call Additional follow up Details #3:: Details for Additional Follow-up Action Taken: [name] Little informed  Additional Follow-up by: Lamar Sprinkles, CMA,  February 24, 2010 5:52 PM  New/Updated Medications: CIPRO 250 MG TABS (CIPROFLOXACIN HCL) two times a day x 5 days Prescriptions: CIPRO 250 MG TABS (CIPROFLOXACIN HCL) two times a day x 5 days  #10 x 0   Entered by:   Lamar Sprinkles, CMA   Authorized by:   Jacques Navy MD   Signed by:   Lamar Sprinkles, CMA on 02/24/2010   Method used:   [facility]  [address] [phone][facility]       [address] [place], [place]  [address]       Ph: [phone] or [phone]       Fax: [phone]   RxID:   [id number]

## 2010-03-03 NOTE — Progress Notes (Signed)
Summary: Med ?   Phone Note Call from Patient Call back at [phone]   Summary of Call: was given Exemestane(Aromasin) 25mg  1 po qd by Dr. Gwenevere Abbot.  Pt read all the side effects and is scared to take this med.  She wants to know Dr. Debby Bud' opinion before she starts this.  Please advise Initial call taken by: Lanier Prude, [facility]),  February 03, 2010 4:02 PM  Follow-up for Phone Call        called pt: 1. I trust Dr. Darnelle Catalan in regard to cancer treatment 2. this product is like tamoxifen and used for long term treatment of breast cancer 3. for questions about the safety of this drug, i.e. the incidence of side effects please ask Dr. Darnelle Catalan since this is not a medication I prescribe and do not have experience with it.  Follow-up by: Jacques Navy MD,  February 03, 2010 6:49 PM

## 2010-03-03 NOTE — Medication Information (Signed)
Summary: rov/sp   Anticoagulant Therapy  Managed by: Weston Brass, PharmD Referring MD: Illene Regulus PCP: Cristal Deer Supervising MD: Graciela Husbands MD, Viviann Spare Indication 1: Deep Vein Thrombosis - Leg (ICD-451.1) Lab Used: [facility] [place] Site: [address] INR POC 2.7 INR RANGE 2 - 3  Dietary changes: no    Health status changes: no    Bleeding/hemorrhagic complications: no    Recent/future hospitalizations: no    Any changes in medication regimen? yes       Details: on clindamycin for tooth infection  Recent/future dental: yes     Details: needing tooth extracted next Tuesday.  Per Dr. Amie Critchley, would like INR  ~2.   Any missed doses?: no       Is patient compliant with meds? yes       Allergies: 1)  Sulfa 2)  Ceftin  Anticoagulation Management History:      The patient is taking warfarin and comes in today for a routine follow up visit.  Positive risk factors for bleeding include an age of 75 years or older.  The bleeding index is 'intermediate risk'.  Positive CHADS2 values include History of HTN.  Negative CHADS2 values include Age > 48 years old.  The start date was 05/16/2004.  Anticoagulation responsible provider: Graciela Husbands MD, Viviann Spare.  INR POC: 2.7.  Exp: 10/2010.    Anticoagulation Management Assessment/Plan:      The patient's current anticoagulation dose is Warfarin sodium 5 mg tabs: Use as directed by Anticoagulation Clinic.  The target INR is 2 - 3.  The next INR is due 02/01/2010.  Anticoagulation instructions were given to patient.  Results were reviewed/authorized by Weston Brass, PharmD.  She was notified by Weston Brass PharmD.         Prior Anticoagulation Instructions: INR 2.9  Continue same dose of 1 tablet every day except 1/2 tablet on Monday and Friday.  Recheck INR in 4 weeks.   Current Anticoagulation Instructions: INR 2.7  Skip today's dose of Coumadin then resume same dose of 1 tablet every day except 1/2 tablet on Monday and  Friday.  Eat extra greens on Sunday and Monday.  Recheck INR in 1 week.

## 2010-03-09 NOTE — Medication Information (Signed)
Summary: rov/sp  Medications Added EXEMESTANE 25 MG TABS (EXEMESTANE) Take 1 tablet daily       Anticoagulant Therapy  Managed by: Weston Brass, PharmD Referring MD: Illene Regulus PCP: Cristal Deer Supervising MD: Myrtis Ser MD, Tinnie Gens Indication 1: Deep Vein Thrombosis - Leg (ICD-451.1) Lab Used: [facility] [place] Site: [address] INR POC 2.1 INR RANGE 2 - 3  Dietary changes: no    Health status changes: no    Bleeding/hemorrhagic complications: no    Recent/future hospitalizations: no    Any changes in medication regimen? yes       Details: Started taking Aromasin daily  Recent/future dental: no  Any missed doses?: no       Is patient compliant with meds? yes       Allergies: 1)  Sulfa 2)  Ceftin  Anticoagulation Management History:      Positive risk factors for bleeding include an age of 75 years or older.  The bleeding index is 'intermediate risk'.  Positive CHADS2 values include History of HTN.  Negative CHADS2 values include Age > 72 years old.  The start date was 05/16/2004.  Anticoagulation responsible provider: Myrtis Ser MD, Tinnie Gens.  INR POC: 2.1.  Exp: 03/2011.    Anticoagulation Management Assessment/Plan:      The patient's current anticoagulation dose is Warfarin sodium 5 mg tabs: Use as directed by Anticoagulation Clinic.  The target INR is 2 - 3.  The next INR is due 03/28/2010.  Anticoagulation instructions were given to patient.  Results were reviewed/authorized by Weston Brass, PharmD.  She was notified by Linward Headland, PharmD candidate.         Prior Anticoagulation Instructions: INR 1.9  Continue same dose of 1 tablet every day except 1/2 tablet on Monday and Friday.  Recheck INR in 4 weeks.   Current Anticoagulation Instructions: INR 2.1 (goal INR: 2-3)  Take 1 tablet everyday except 1/2 tablet on Mondays and Fridays.  Recheck in 4 weeks.

## 2010-03-17 ENCOUNTER — Encounter: Payer: Self-pay | Admitting: Gastroenterology

## 2010-03-17 ENCOUNTER — Encounter (HOSPITAL_BASED_OUTPATIENT_CLINIC_OR_DEPARTMENT_OTHER): Payer: Medicare Other | Admitting: Oncology

## 2010-03-17 DIAGNOSIS — D6859 Other primary thrombophilia: Secondary | ICD-10-CM

## 2010-03-17 DIAGNOSIS — C50519 Malignant neoplasm of lower-outer quadrant of unspecified female breast: Secondary | ICD-10-CM

## 2010-03-17 DIAGNOSIS — Z7901 Long term (current) use of anticoagulants: Secondary | ICD-10-CM

## 2010-03-25 ENCOUNTER — Encounter: Payer: Self-pay | Admitting: Cardiology

## 2010-03-25 DIAGNOSIS — D682 Hereditary deficiency of other clotting factors: Secondary | ICD-10-CM

## 2010-03-25 DIAGNOSIS — Z86718 Personal history of other venous thrombosis and embolism: Secondary | ICD-10-CM

## 2010-03-28 ENCOUNTER — Encounter (INDEPENDENT_AMBULATORY_CARE_PROVIDER_SITE_OTHER): Payer: Medicare Other

## 2010-03-28 ENCOUNTER — Encounter: Payer: Self-pay | Admitting: Cardiology

## 2010-03-28 DIAGNOSIS — Z7901 Long term (current) use of anticoagulants: Secondary | ICD-10-CM

## 2010-03-28 DIAGNOSIS — I80299 Phlebitis and thrombophlebitis of other deep vessels of unspecified lower extremity: Secondary | ICD-10-CM

## 2010-03-28 LAB — CONVERTED CEMR LAB: POC INR: 3.1

## 2010-03-29 ENCOUNTER — Encounter: Payer: Self-pay | Admitting: Gastroenterology

## 2010-03-29 ENCOUNTER — Ambulatory Visit (INDEPENDENT_AMBULATORY_CARE_PROVIDER_SITE_OTHER): Payer: Medicare Other | Admitting: Gastroenterology

## 2010-03-29 ENCOUNTER — Other Ambulatory Visit: Payer: Medicare Other

## 2010-03-29 DIAGNOSIS — R197 Diarrhea, unspecified: Secondary | ICD-10-CM | POA: Insufficient documentation

## 2010-03-31 ENCOUNTER — Encounter: Payer: Self-pay | Admitting: Gastroenterology

## 2010-04-05 ENCOUNTER — Encounter: Payer: Self-pay | Admitting: Internal Medicine

## 2010-04-05 ENCOUNTER — Other Ambulatory Visit: Payer: Self-pay | Admitting: Internal Medicine

## 2010-04-05 ENCOUNTER — Encounter (INDEPENDENT_AMBULATORY_CARE_PROVIDER_SITE_OTHER): Payer: Self-pay | Admitting: *Deleted

## 2010-04-05 ENCOUNTER — Other Ambulatory Visit: Payer: Medicare Other

## 2010-04-05 DIAGNOSIS — D689 Coagulation defect, unspecified: Secondary | ICD-10-CM

## 2010-04-05 LAB — PROTIME-INR
INR: 3.5 ratio — ABNORMAL HIGH (ref 0.8–1.0)
Prothrombin Time: 34.7 s — ABNORMAL HIGH (ref 10.2–12.4)

## 2010-04-05 LAB — CONVERTED CEMR LAB
POC INR: 3.5
Prothrombin Time: 34.7 s

## 2010-04-07 NOTE — Letter (Addendum)
Summary: [facility]  [facility]   Imported By: Lester Branch 03/31/2010 09:29:40  _____________________________________________________________________  External Attachment:    Type:   Image     Comment:   External Document

## 2010-04-07 NOTE — Medication Information (Signed)
Summary: rov/rs per pt call-mj  Anticoagulant Therapy  Managed by: Bethena Midget, RN, BSN Referring MD: Illene Regulus PCP: Cristal Deer Supervising MD: Jens Som MD, Arlys John Indication 1: Deep Vein Thrombosis - Leg (ICD-451.1) Lab Used: [facility] [place] Site: [address] INR POC 3.1 INR RANGE 2 - 3  Dietary changes: no    Health status changes: no    Bleeding/hemorrhagic complications: no    Recent/future hospitalizations: no    Any changes in medication regimen? yes       Details: Was on Exemestane 25mg s daily for 5 weeks and it was discontinued 03/17/10  Recent/future dental: no  Any missed doses?: no       Is patient compliant with meds? yes       Allergies: 1)  Sulfa 2)  Ceftin  Anticoagulation Management History:      The patient is taking warfarin and comes in today for a routine follow up visit.  Positive risk factors for bleeding include an age of 47 years or older.  The bleeding index is 'intermediate risk'.  Positive CHADS2 values include History of HTN.  Negative CHADS2 values include Age > 70 years old.  The start date was 05/16/2004.  Anticoagulation responsible provider: Jens Som MD, Arlys John.  INR POC: 3.1.  Cuvette Lot#: [id number].  Exp: 01/2011.    Anticoagulation Management Assessment/Plan:      The patient's current anticoagulation dose is Warfarin sodium 5 mg tabs: Use as directed by Anticoagulation Clinic.  The target INR is 2 - 3.  The next INR is due 04/18/2010.  Anticoagulation instructions were given to patient.  Results were reviewed/authorized by Bethena Midget, RN, BSN.  She was notified by Bethena Midget, RN, BSN.         Prior Anticoagulation Instructions: INR 2.1 (goal INR: 2-3)  Take 1 tablet everyday except 1/2 tablet on Mondays and Fridays.  Recheck in 4 weeks.  Current Anticoagulation Instructions: INR 3.1 Skip today's dose then resume 1 pill everyday except 1/2 pill on Mondays and Fridays. Recheck in 3 weeks.

## 2010-04-07 NOTE — Assessment & Plan Note (Signed)
Summary: Increased Frequency In BM   History of Present Illness Visit Type: Follow-up Visit Primary GI MD: Sheryn Bison MD FACP FAGA Primary Provider: Illene Regulus, MD Requesting Provider: n/a Chief Complaint: Patient comes today to discuss having increased frequency in BMs after starting exemestane. She has d/c'ed rx but continues with symptoms. History of Present Illness:   77 year old Caucasian female who's had right breast carcinoma with surgery and radiation therapy. Attempts to treat her with Exemestane 25 mg a day has caused nausea, and she also has had 6-8 loose bowel movements a day with mucus but no blood, lower abdominal cramping, general malaise, and fatigue.  She took broad-spectrum antibiotics for tooth infection in early January. She denies travel or known infectious disease exposure otherwise. There is no history of upper GI or hepatobiliary complaints. She currently is on a low fiber diet and is maintaining her weight. Her nausea seemed to have improved.   GI Review of Systems    Reports abdominal pain and  loss of appetite.     Location of  Abdominal pain: lower abdomen.    Denies acid reflux, belching, bloating, chest pain, dysphagia with liquids, dysphagia with solids, heartburn, nausea, vomiting, vomiting blood, weight loss, and  weight gain.      Reports change in bowel habits, hemorrhoids, rectal bleeding, and  rectal pain.      Current Medications (verified): 1)  Warfarin Sodium 5 Mg Tabs (Warfarin Sodium) .... Use As Directed By Anticoagulation Clinic 2)  Niaspan 500 Mg  Tbcr (Niacin (Antihyperlipidemic)) .... Take 3 Tab By Mouth At Bedtime 3)  Fish Oil 1000 Mg  Caps (Omega-3 Fatty Acids) .Marland Kitchen.. 1 Qd 4)  Hydrochlorothiazide 12.5 Mg  Tabs (Hydrochlorothiazide) .Marland Kitchen.. 1 Qd 5)  Fa-Cyanocobalamin-Pyridoxine 2.05-31-23 Mg Tabs (Folic Acid-Vit B6-Vit B12) .... Take 1 Tablet By Mouth Once A Day 6)  Magnesium Oxide 250 Mg  Tabs (Magnesium Oxide) .... Take 1 Tablet By  Mouth Once A Day 7)  Klor-Con M20 20 Meq Cr-Tabs (Potassium Chloride Crys Cr) .... One Tablet By Mouth Once Daily 8)  Lisinopril 10 Mg  Tabs (Lisinopril) .Marland Kitchen.. 1 By Mouth Daily 9)  Caltrate 600+d 600-400 Mg-Unit Tabs (Calcium Carbonate-Vitamin D) 10)  Nasacort Aq 55 Mcg/act Aers (Triamcinolone Acetonide(Nasal)) .Marland Kitchen.. 1 Spray Each Nostril Daily Prn  Allergies (verified): 1)  Sulfa 2)  Ceftin  Past History:  Past medical, surgical, family and social histories (including risk factors) reviewed for relevance to current acute and chronic problems.  Past Medical History: Reviewed history from 08/26/2009 and no changes required. 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 Jamey Ripa) Right Calf surgery-for cancer  Family History: Reviewed history from 07/27/2009 and no changes required. positive for cardiovascular disease in parents and uncle. Family History of Clotting disorder:  uncle No FH of Colon Cancer, DM Father - deceased @74 : CVA, CAD Mother - deceased @ 33: CVA, CAD, HTN Family History of Breast Cancer: Aunt  Social History: Reviewed history from 07/27/2009 and no changes required. married 1955 2 sons- '59, '61, 2 daughters-'55, '57; 8 grandchildren; 1 great-grand I-ADLs  Occupation: retired Patient has never smoked.  Alcohol Use - no Illicit Drug Use - no  Review of Systems       The patient complains of change in vision and fatigue.  The patient denies allergy/sinus, anemia, anxiety-new, arthritis/joint  pain, back pain, blood in urine, breast changes/lumps, confusion, cough, coughing up blood, depression-new, fainting, fever, headaches-new, hearing problems, heart murmur, heart rhythm changes, itching, menstrual pain, muscle pains/cramps, night sweats, nosebleeds, pregnancy symptoms, shortness of breath, skin rash, sleeping problems, sore throat, swelling of feet/legs, swollen lymph glands, thirst - excessive , urination - excessive , urination changes/pain, urine leakage, vision changes, and voice change.    Vital Signs:  Patient profile:   77 year old female Height:      64 inches Weight:      166.38 pounds BMI:     28.66 BSA:     1.81 Pulse rate:   84 / minute Pulse rhythm:   regular BP sitting:   120 / 70  (left arm)  Vitals Entered By: Lamona Curl CMA Duncan Dull) (March 29, 2010 12:10 PM)  Physical Exam  General:  Well developed, well nourished, no acute distress.healthy appearing.   Head:  Normocephalic and atraumatic. Eyes:  PERRLA, no icterus.exam deferred to patient's ophthalmologist.   Lungs:  Clear throughout to auscultation. Heart:  Regular rate and rhythm; no murmurs, rubs,  or bruits. Abdomen:  Soft, nontender and nondistended. No masses, hepatosplenomegaly or hernias noted. Normal bowel sounds. Rectal:  Normal exam.Slimy stool with a bad odor which is very loose and has trace heme positive. This female is fairly classic for C. difficile. Msk:  Symmetrical with no gross deformities. Normal posture. Extremities:  No clubbing, cyanosis, edema or deformities noted. Neurologic:  Alert and  oriented x4;  grossly normal neurologically. Psych:  Alert and cooperative. Normal mood and affect.   Impression & Recommendations:  Problem # 1:  DIARRHEA (ICD-787.91) Assessment Unchanged Probable C. difficile colitis associated with broad-spectrum antibiotic use. We will check stool C. difficile toxin assay which by PCR and start metronidazole 250 mg t.i.d. for [redacted] weeks along with  daily Florstar probiotic therapy. She is to see me back in 2 weeks' time for followup. Orders: T-C diff by PCR ([id number])  Problem # 2:  FACTOR V DEFICIENCY (ICD-286.3) Assessment: Comment Only continue Coumadin therapy as per Dr. Debby Bud. She will need protime monitor his age and her are her antibiotic therapy. I would suggest repeat prothrombin time in one week in 2 weeks' time.  Patient Instructions: 1)  Copy sent to : Illene Regulus, MD & Dr G.Magrinat 2)  Please go to the basement today for your labs.  3)  Your prescription(s) have been sent to you pharmacy.   4)  The medication list was reviewed and reconciled.  All changed / newly prescribed medications were explained.  A complete medication list was provided to the patient / caregiver. Prescriptions: METRONIDAZOLE 250 MG TABS (METRONIDAZOLE) take one by mouth three times a day for 14  #42 x 0   Entered by:   Harlow Mares CMA (AAMA)   Authorized by:   Mardella Layman MD [facility]   Signed by:   Mardella Layman MD [facility] on 03/29/2010   Method used:   [facility]  [address] [phone]       [address]       [place], [place]  [address]       Ph: [phone] or [phone]  Fax: [phone]   RxID:   [id number]   Appended Document: Increased Frequency In BM schedule PT in one and 2 weeks time for possible PT elevation with metronidazole use also copy Dr. Debby Bud  Appended Document: Increased Frequency In BM Left a message on patients machine to call back.   Appended Document: Increased Frequency In BM pt aware and orders in Epic she will come 04/05/2010 and 04/12/2010  Appended Document: Increased Frequency In BM copy Dr. Debby Bud and Dr.Magrinot in oncology .stool result

## 2010-04-11 ENCOUNTER — Telehealth: Payer: Self-pay | Admitting: Physician Assistant

## 2010-04-12 ENCOUNTER — Encounter: Payer: Self-pay | Admitting: Internal Medicine

## 2010-04-12 ENCOUNTER — Other Ambulatory Visit: Payer: Medicare Other

## 2010-04-14 ENCOUNTER — Encounter (HOSPITAL_BASED_OUTPATIENT_CLINIC_OR_DEPARTMENT_OTHER): Payer: Medicare Other | Admitting: Oncology

## 2010-04-14 ENCOUNTER — Encounter (INDEPENDENT_AMBULATORY_CARE_PROVIDER_SITE_OTHER): Payer: Medicare Other

## 2010-04-14 ENCOUNTER — Encounter: Payer: Self-pay | Admitting: Internal Medicine

## 2010-04-14 DIAGNOSIS — I80299 Phlebitis and thrombophlebitis of other deep vessels of unspecified lower extremity: Secondary | ICD-10-CM

## 2010-04-14 DIAGNOSIS — Z7901 Long term (current) use of anticoagulants: Secondary | ICD-10-CM

## 2010-04-14 DIAGNOSIS — D6859 Other primary thrombophilia: Secondary | ICD-10-CM

## 2010-04-14 DIAGNOSIS — C50519 Malignant neoplasm of lower-outer quadrant of unspecified female breast: Secondary | ICD-10-CM

## 2010-04-14 LAB — CONVERTED CEMR LAB: POC INR: 3.2

## 2010-04-19 NOTE — Medication Information (Signed)
Summary: rov/tm  Anticoagulant Therapy  Managed by: Cloyde Reams, RN, BSN Referring MD: Illene Regulus PCP: Illene Regulus, MD Supervising MD: Johney Frame MD, Fayrene Fearing Indication 1: Deep Vein Thrombosis - Leg (ICD-451.1) Lab Used: [facility] [place] Site: [address] INR POC 3.2 INR RANGE 2 - 3  Dietary changes: no    Health status changes: no    Bleeding/hemorrhagic complications: no    Recent/future hospitalizations: no    Any changes in medication regimen? yes       Details: Started on Metronidazole 250mg  tid on 2/28, completed yesterday.    Recent/future dental: no  Any missed doses?: no       Is patient compliant with meds? yes      Comments: Saw Dr Jarold Motto on 3/6 INR 3.5, called skipped 1 dosage on 04/11/10.    Allergies: 1)  Sulfa 2)  Ceftin  Anticoagulation Management History:      The patient is taking warfarin and comes in today for a routine follow up visit.  Positive risk factors for bleeding include an age of 75 years or older.  The bleeding index is 'intermediate risk'.  Positive CHADS2 values include History of HTN.  Negative CHADS2 values include Age > 37 years old.  The start date was 05/16/2004.  Her last INR was 3.5 ratio.  Anticoagulation responsible provider: Yordan Martindale MD, Fayrene Fearing.  INR POC: 3.2.  Exp: 01/2011.    Anticoagulation Management Assessment/Plan:      The patient's current anticoagulation dose is Warfarin sodium 5 mg tabs: Use as directed by Anticoagulation Clinic.  The target INR is 2 - 3.  The next INR is due 04/28/2010.  Anticoagulation instructions were given to patient.  Results were reviewed/authorized by Cloyde Reams, RN, BSN.  She was notified by Cloyde Reams RN.         Prior Anticoagulation Instructions: INR 3.5  Spoke with pt.  Instructed to skip today's dose of Coumadin then resume same dose of 1 tablet every day except 1/2 tablet on Monday and Friday.  Recheck INR in 3 days  Current Anticoagulation Instructions: INR  3.2  Skip today's dosage of Coumadin, then resume same dosage 1 tablet daily except 1/2 tablet on Mondays and Fridays.  Recheck in 2 weeks.

## 2010-04-19 NOTE — Medication Information (Signed)
Summary: Coumadin Clinic  Anticoagulant Therapy  Managed by: Weston Brass, PharmD Referring MD: Illene Regulus PCP: Illene Regulus, MD Supervising MD: Ladona Ridgel MD, Sharlot Gowda Indication 1: Deep Vein Thrombosis - Leg (ICD-451.1) Lab Used: [facility] [place] Site: [address] PT 34.7 INR POC 3.5 INR RANGE 2 - 3  Dietary changes: no    Health status changes: no    Bleeding/hemorrhagic complications: no    Recent/future hospitalizations: no    Any changes in medication regimen? yes       Details: pt to finished metronidazole tomorrow  Recent/future dental: no  Any missed doses?: no       Is patient compliant with meds? yes      Comments: INR drawn on 3/6.  Lab not received in Coumadin clinic until 3/12  Allergies: 1)  Sulfa 2)  Ceftin  Anticoagulation Management History:      Her anticoagulation is being managed by telephone today.  Positive risk factors for bleeding include an age of 23 years or older.  The bleeding index is 'intermediate risk'.  Positive CHADS2 values include History of HTN.  Negative CHADS2 values include Age > 1 years old.  The start date was 05/16/2004.  Her last INR was 3.5 ratio.  Prothrombin time is 34.7.  Anticoagulation responsible provider: Ladona Ridgel MD, Sharlot Gowda.  INR POC: 3.5.  Exp: 01/2011.    Anticoagulation Management Assessment/Plan:      The patient's current anticoagulation dose is Warfarin sodium 5 mg tabs: Use as directed by Anticoagulation Clinic.  The target INR is 2 - 3.  The next INR is due 04/14/2010.  Anticoagulation instructions were given to patient.  Results were reviewed/authorized by Weston Brass, PharmD.  She was notified by Weston Brass PharmD.         Prior Anticoagulation Instructions: INR 3.1 Skip today's dose then resume 1 pill everyday except 1/2 pill on Mondays and Fridays. Recheck in 3 weeks.   Current Anticoagulation Instructions: INR 3.5  Spoke with pt.  Instructed to skip today's dose of Coumadin then resume same  dose of 1 tablet every day except 1/2 tablet on Monday and Friday.  Recheck INR in 3 days

## 2010-04-19 NOTE — Progress Notes (Signed)
Summary: Lab results  Phone Note Call from Patient Call back at Home Phone [phone]   Caller: Patient Call For: Dr. Jarold Motto Reason for Call: Lab or Test Results Summary of Call: Calling about Lab results Initial call taken by: Karna Christmas, [facility]  Follow-up for Phone Call        labs were entered under wrong provider and Dr. Jarold Motto never seen them I am sending this missage to Mike Gip, PA to address PT from 04/05/2010 Follow-up by: Harlow Mares CMA (AAMA),  April 11, 2010 1:00 PM

## 2010-04-22 ENCOUNTER — Ambulatory Visit (HOSPITAL_COMMUNITY)
Admission: RE | Admit: 2010-04-22 | Discharge: 2010-04-22 | Disposition: A | Discharge status: Home / Self Care | Payer: Medicare Other | Source: Ambulatory Visit | Attending: Internal Medicine | Admitting: Internal Medicine

## 2010-04-22 ENCOUNTER — Telehealth: Payer: Self-pay | Admitting: Internal Medicine

## 2010-04-22 ENCOUNTER — Other Ambulatory Visit (HOSPITAL_COMMUNITY): Payer: Self-pay | Admitting: *Deleted

## 2010-04-22 ENCOUNTER — Telehealth: Payer: Self-pay | Admitting: *Deleted

## 2010-04-22 ENCOUNTER — Other Ambulatory Visit: Payer: Self-pay | Admitting: Internal Medicine

## 2010-04-22 ENCOUNTER — Encounter: Payer: Self-pay | Admitting: Internal Medicine

## 2010-04-22 ENCOUNTER — Ambulatory Visit (INDEPENDENT_AMBULATORY_CARE_PROVIDER_SITE_OTHER): Payer: Medicare Other | Admitting: Internal Medicine

## 2010-04-22 DIAGNOSIS — M7989 Other specified soft tissue disorders: Secondary | ICD-10-CM | POA: Insufficient documentation

## 2010-04-22 DIAGNOSIS — M79669 Pain in unspecified lower leg: Secondary | ICD-10-CM

## 2010-04-22 DIAGNOSIS — M79609 Pain in unspecified limb: Secondary | ICD-10-CM

## 2010-04-22 DIAGNOSIS — D682 Hereditary deficiency of other clotting factors: Secondary | ICD-10-CM

## 2010-04-22 NOTE — Telephone Encounter (Signed)
Pt states that she needs an OV today. Pt states that she is prone to blood clots and her Right leg is ver sore & painful this morning. Please Advise.

## 2010-04-22 NOTE — Telephone Encounter (Signed)
Pt is aware of neg doppler, she wants to know what MD advises for pain.

## 2010-04-22 NOTE — Telephone Encounter (Signed)
Spoke with patient: APAP for pain, heat, elevation of the leg.

## 2010-04-22 NOTE — Telephone Encounter (Signed)
Phone Note hand delivered to Dr Debby Bud for response. Per VO Dr Debby Bud, Pt called & informed to come in to office now and Pt worked into schedule.

## 2010-04-24 NOTE — Progress Notes (Signed)
  Subjective:    Patient ID: [name] Little, female    DOB: [birth year], 77 y.o.   MRN: [id number]  HPI  Patient presents with c/o enlargment of right leg and calve tenderness. She has  hypercoaguable state and is on coumadin. She has had no shortness of breath of sudden sense of impending doom.  Past Medical History  Diagnosis Date  . Personal history of colonic polyps     hyperplastic  . Hemorrhoids   . Thoracic aortic aneurysm   . Paroxysmal supraventricular tachycardia   . Factor V deficiency   . Infiltrating ductal carcinoma of breast     stage one right   . TMJ pain dysfunction syndrome   . DVT (deep venous thrombosis)   . Hyperlipemia    Past Surgical History  Procedure Date  . Total hip arthroplasty 1999    right  . Laparoscopic hysterectomy   . Splenectomy   . Breast lumpectomy 2001    right Dr. Jamey Ripa  . Right calf surgery     for cancer       Review of Systems  Constitutional: Negative.  Negative for fever and activity change.  HENT: Negative.   Eyes: Negative.   Respiratory: Negative.  Negative for cough, chest tightness and shortness of breath.   Cardiovascular: Negative.  Negative for chest pain.  Musculoskeletal: Negative.   Neurological: Negative.   Hematological: Negative.        Objective:   Physical Exam  Constitutional: She is oriented to person, place, and time. She appears well-developed and well-nourished.  HENT:  Head: Normocephalic and atraumatic.  Cardiovascular: Normal rate and regular rhythm.   Pulmonary/Chest: Effort normal and breath sounds normal. No respiratory distress. She has no wheezes. She has no rales.  Musculoskeletal: Normal range of motion. She exhibits no edema.       Circumference of legs measured and are equal.  Tenderness noted to palpation of right calve. Negative Homan's sign.  Neurological: She is alert and oriented to person, place, and time. She has normal reflexes.  Skin: Skin is warm and dry.  Psychiatric:  Her behavior is normal. Thought content normal.          Assessment & Plan:  1. Leg pain - concern for possible DVT. Patient is referred directly to non-invasive vascular lab for LE venous doppler.  Addendum - LE venous doppler negative for DVT (phone report)  Patient advise to use heat and APAP for leg discomfort.

## 2010-04-25 ENCOUNTER — Ambulatory Visit (INDEPENDENT_AMBULATORY_CARE_PROVIDER_SITE_OTHER): Payer: Medicare Other | Admitting: Internal Medicine

## 2010-04-25 VITALS — BP 140/80 | HR 75 | Temp 98.5°F

## 2010-04-25 DIAGNOSIS — M79604 Pain in right leg: Secondary | ICD-10-CM

## 2010-04-25 DIAGNOSIS — M79609 Pain in unspecified limb: Secondary | ICD-10-CM

## 2010-04-25 MED ORDER — PREDNISONE 10 MG PO TABS: ORAL_TABLET | ORAL | Status: DC

## 2010-04-25 NOTE — Patient Instructions (Signed)
Provided hand written instructions: working diagnosis leg pain from possible nerve root impingment. She is to call back if pain not relieved

## 2010-04-25 NOTE — Progress Notes (Signed)
  Subjective:    Patient ID: [name] Little, female    DOB: Oct 23, 1933, 77 y.o.   MRN: [id number]  HPI Mrs. Josten was seen last Friday for sudden on-set of right calve pain. She did have a LE venous doppler that was negative for DVT. She presents acutely today for progressive pain in the right calve to the point where she cannot stand or walk. She has had no injury. She does admit that she will have pain that occasionally radiates down her posterior thigh to the calve. She has had not had any back pain and has no h/o DDD or DJD spine.   PMH, FMHx, SocHx - unchanged from last visit and reviewed for relevance.     Review of Systems  Constitutional: Negative for fever, chills, diaphoresis, activity change and fatigue.  HENT: Negative.   Respiratory: Negative.  Negative for cough, chest tightness and shortness of breath.   Cardiovascular: Negative for chest pain and leg swelling.  Gastrointestinal: Negative.   Genitourinary: Negative for urgency, frequency, flank pain, enuresis and difficulty urinating.  Musculoskeletal: Negative for back pain.       Severe leg pain right calve  Neurological: Negative.  Negative for dizziness, syncope, facial asymmetry and numbness.  Hematological: Negative.   Psychiatric/Behavioral: Negative for behavioral problems, confusion and agitation.       Objective:   Physical Exam  Constitutional: She is oriented to person, place, and time. She appears well-developed and well-nourished.  HENT:  Head: Normocephalic and atraumatic.  Eyes: Conjunctivae and EOM are normal.  Cardiovascular: Normal rate and regular rhythm.   Musculoskeletal: Normal range of motion. She exhibits no edema.       Unable to stand patient for exam due to pain. SLR sitting caused pain right calve. Passive motion also caused pain. No palpable mass. DTR right patellar tendon was normal   Neurological: She is alert and oriented to person, place, and time.  Skin: Skin is warm and dry.           Assessment & Plan:  1. Leg pain - right calve. With no DVT, no circulatory findings, no mass, no injury suspect this may be originating from nerve root irritation L4-5, L5-S1 either from DJD or more likely DDD/HNP  Plan - prednisone burst and taper             If no improvement in pain over 48 hours will move ahead with L-S spine films, possible MRI.

## 2010-04-26 ENCOUNTER — Ambulatory Visit: Payer: Medicare Other | Admitting: Gastroenterology

## 2010-04-28 ENCOUNTER — Ambulatory Visit (INDEPENDENT_AMBULATORY_CARE_PROVIDER_SITE_OTHER): Payer: Medicare Other | Admitting: *Deleted

## 2010-04-28 ENCOUNTER — Encounter: Payer: Self-pay | Admitting: Gastroenterology

## 2010-04-28 ENCOUNTER — Ambulatory Visit (INDEPENDENT_AMBULATORY_CARE_PROVIDER_SITE_OTHER): Payer: Medicare Other | Admitting: Gastroenterology

## 2010-04-28 VITALS — BP 122/64 | HR 80 | Ht 63.0 in | Wt 168.0 lb

## 2010-04-28 DIAGNOSIS — M79609 Pain in unspecified limb: Secondary | ICD-10-CM

## 2010-04-28 DIAGNOSIS — D682 Hereditary deficiency of other clotting factors: Secondary | ICD-10-CM

## 2010-04-28 DIAGNOSIS — R197 Diarrhea, unspecified: Secondary | ICD-10-CM

## 2010-04-28 DIAGNOSIS — Z86718 Personal history of other venous thrombosis and embolism: Secondary | ICD-10-CM

## 2010-04-28 DIAGNOSIS — M79605 Pain in left leg: Secondary | ICD-10-CM

## 2010-04-28 LAB — POCT INR: INR: 2

## 2010-04-28 MED ORDER — SACCHAROMYCES BOULARDII 250 MG PO CAPS: 250.0000 mg | ORAL_CAPSULE | Freq: Two times a day (BID) | ORAL | Status: DC

## 2010-04-28 NOTE — Patient Instructions (Signed)
Please go to the basement today for your labs.  Buy florastor OTC and take one by mouth twice day.

## 2010-04-28 NOTE — Assessment & Plan Note (Signed)
Continue warfarin therapy as per primary care.

## 2010-04-28 NOTE — Patient Instructions (Signed)
Continue 5mg  daily except 2.5mg s on Mondays and Fridays. Recheck in 10 days.

## 2010-04-28 NOTE — Progress Notes (Deleted)
This patient is a  Pertinent review of systems History of Present Illness:  This is a      ROS: The remainder of the 10 point ROS is negative     Physical Exam: General well developed well nourished patient in no acute distress, appearing their stated age Eyes PERRLA, no icterus fundoscopic exam per opthamologist Skin no lesions noted Neck supple, no adenopathy, no thyroid enlargement, no tenderness Chest clear to percussion and auscultation Heart no significant murmurs, gallops or rubs noted Abdomen no hepatosplenomegaly masses or tenderness, BS normal.  Rectal inspection normal no fissures, or fistulae noted.  No masses or tenderness on digital exam. Stool guaiac negative. Extremities no acute joint lesions, edema, phlebitis or evidence of cellulitis. Neurologic patient oriented x 3, cranial nerves intact, no focal neurologic deficits noted. Psychological mental status normal and normal affect.  Physical exam not performed  Assessment and plan          ;

## 2010-04-28 NOTE — Progress Notes (Signed)
This is a 77 year old Caucasian female with multiple medical problems including factor V deficiency requiring chronic warfarin therapy. She currently is having problems with pain in her right leg with a negative Doppler exam per Dr.Norins. She currently is on a tapering dose of prednisone. Saw her several weeks ago with at which time she was diagnosed as having C. difficile colitis and diarrhea and she responded well to 2 weeks of metronidazole therapy and probiotics. She currently is asymptomatic but will occasionally passed some mucus in her stool without heme. Denies abdominal pain, upper GI or hepatobiliary complaints. Her appetite is good her weight is stable. The pain in her right leg does not seem to be improving. She does have previous history of pulmonary emboli.   Pertinent Review of Systems Negative.. no current cardiopulmonary complaints all hepatobiliary symptoms, with systemic complaints. She has had a previous splenectomy and hysterectomy.   Physical Exam: Awake alert no acute distress appearing her stated age. Chest is clear cardiac exam is unremarkable without murmurs gallops or rubs. Her abdomen is soft and nontender without organomegaly. Rectal exam is deferred. Bowel sounds are normal. There is edema of both legs right greater than left without any evidence of phlebitis or cellulitis.    Assessment and Plan: Her C. difficile colitis has been treated and she is doing well at this time but she is at high risk for relapse with corticosteroid therapy. I will order repeat stool exam for C. difficile by PCR, have restarted Florstar probiotic there be twice a day, she is to call if she has relapse of her diarrhea or any abdominal symptomatology otherwise. Eversion her continue her follow up with  Dr.Norins in primary care and to continue her warfarin therapy.

## 2010-04-29 ENCOUNTER — Other Ambulatory Visit: Payer: Medicare Other

## 2010-04-29 ENCOUNTER — Other Ambulatory Visit: Payer: Self-pay | Admitting: Gastroenterology

## 2010-04-29 DIAGNOSIS — R197 Diarrhea, unspecified: Secondary | ICD-10-CM

## 2010-05-04 LAB — CLOSTRIDIUM DIFFICILE BY PCR: Toxigenic C. Difficile by PCR: DETECTED — CR

## 2010-05-05 ENCOUNTER — Telehealth: Payer: Self-pay | Admitting: *Deleted

## 2010-05-05 DIAGNOSIS — A0472 Enterocolitis due to Clostridium difficile, not specified as recurrent: Secondary | ICD-10-CM

## 2010-05-05 MED ORDER — METRONIDAZOLE 250 MG PO TABS: ORAL_TABLET | ORAL | Status: DC

## 2010-05-05 MED ORDER — SACCHAROMYCES BOULARDII 250 MG PO CAPS: ORAL_CAPSULE | ORAL | Status: DC

## 2010-05-05 NOTE — Telephone Encounter (Signed)
Message copied by Harlow Mares on Thu May 05, 2010  9:29 AM ------      Message from: Jarold Motto, DAVID      Created: Wed May 04, 2010 11:56 AM       Prescribed metronidazole 250 mg 3 times a day for 2 weeks, then 250 mg twice a day for 2 weeks, 250 mg daily for 2 weeks with repeat C. difficile toxin in 6 weeks' time and continue daily for store

## 2010-05-05 NOTE — Telephone Encounter (Signed)
Patient aware and she will come for repeat c diff on 06/16/2010. rxs sent to pharmacy

## 2010-05-09 ENCOUNTER — Ambulatory Visit (INDEPENDENT_AMBULATORY_CARE_PROVIDER_SITE_OTHER): Payer: Medicare Other | Admitting: *Deleted

## 2010-05-09 ENCOUNTER — Telehealth: Payer: Self-pay | Admitting: *Deleted

## 2010-05-09 DIAGNOSIS — Z86718 Personal history of other venous thrombosis and embolism: Secondary | ICD-10-CM

## 2010-05-09 DIAGNOSIS — D682 Hereditary deficiency of other clotting factors: Secondary | ICD-10-CM

## 2010-05-09 LAB — PROTIME-INR
INR: 1.6 — ABNORMAL HIGH (ref 0.00–1.49)
Prothrombin Time: 19.3 seconds — ABNORMAL HIGH (ref 11.6–15.2)

## 2010-05-09 LAB — DIFFERENTIAL
Basophils Absolute: 0.1 10*3/uL (ref 0.0–0.1)
Basophils Relative: 1 % (ref 0–1)
Eosinophils Absolute: 0.1 10*3/uL (ref 0.0–0.7)
Eosinophils Relative: 1 % (ref 0–5)
Lymphocytes Relative: 40 % (ref 12–46)
Lymphs Abs: 2.8 10*3/uL (ref 0.7–4.0)
Monocytes Absolute: 0.9 10*3/uL (ref 0.1–1.0)
Monocytes Relative: 12 % (ref 3–12)
Neutro Abs: 3.3 10*3/uL (ref 1.7–7.7)
Neutrophils Relative %: 46 % (ref 43–77)

## 2010-05-09 LAB — URINALYSIS, ROUTINE W REFLEX MICROSCOPIC
Bilirubin Urine: NEGATIVE
Glucose, UA: NEGATIVE mg/dL
Hgb urine dipstick: NEGATIVE
Ketones, ur: NEGATIVE mg/dL
Nitrite: NEGATIVE
Protein, ur: NEGATIVE mg/dL
Specific Gravity, Urine: 1.021 (ref 1.005–1.030)
Urobilinogen, UA: 0.2 mg/dL (ref 0.0–1.0)
pH: 6 (ref 5.0–8.0)

## 2010-05-09 LAB — COMPREHENSIVE METABOLIC PANEL
ALT: 19 U/L (ref 0–35)
AST: 28 U/L (ref 0–37)
Albumin: 3.9 g/dL (ref 3.5–5.2)
Alkaline Phosphatase: 53 U/L (ref 39–117)
BUN: 23 mg/dL (ref 6–23)
CO2: 25 mEq/L (ref 19–32)
Calcium: 9.9 mg/dL (ref 8.4–10.5)
Chloride: 103 mEq/L (ref 96–112)
Creatinine, Ser: 0.93 mg/dL (ref 0.4–1.2)
GFR calc Af Amer: >60 mL/min (ref >60)
GFR calc non Af Amer: 59 mL/min — ABNORMAL LOW (ref >60)
Glucose, Bld: 119 mg/dL — ABNORMAL HIGH (ref 70–99)
Potassium: 3.8 mEq/L (ref 3.5–5.1)
Sodium: 140 mEq/L (ref 135–145)
Total Bilirubin: 0.8 mg/dL (ref 0.3–1.2)
Total Protein: 7.4 g/dL (ref 6.0–8.3)

## 2010-05-09 LAB — URINE MICROSCOPIC-ADD ON

## 2010-05-09 LAB — CBC
HCT: 39.3 % (ref 36.0–46.0)
Hemoglobin: 13.1 g/dL (ref 12.0–15.0)
MCHC: 33.4 g/dL (ref 30.0–36.0)
MCV: 94.8 fL (ref 78.0–100.0)
Platelets: 295 10*3/uL (ref 150–400)
RBC: 4.15 MIL/uL (ref 3.87–5.11)
RDW: 14.1 % (ref 11.5–15.5)
WBC: 7.1 10*3/uL (ref 4.0–10.5)

## 2010-05-09 LAB — POCT INR: INR: 1.7

## 2010-05-09 LAB — APTT: aPTT: 32 seconds (ref 24–37)

## 2010-05-09 NOTE — Telephone Encounter (Signed)
Patient requesting wk in apt this week for leg pain.

## 2010-05-10 ENCOUNTER — Ambulatory Visit (INDEPENDENT_AMBULATORY_CARE_PROVIDER_SITE_OTHER): Payer: Medicare Other | Admitting: Internal Medicine

## 2010-05-10 ENCOUNTER — Encounter: Payer: Self-pay | Admitting: Internal Medicine

## 2010-05-10 ENCOUNTER — Ambulatory Visit (HOSPITAL_COMMUNITY)
Admission: RE | Admit: 2010-05-10 | Discharge: 2010-05-10 | Disposition: A | Discharge status: Home / Self Care | Payer: Medicare Other | Source: Ambulatory Visit | Attending: Internal Medicine | Admitting: Internal Medicine

## 2010-05-10 VITALS — BP 128/66 | HR 86 | Temp 98.5°F | Wt 168.0 lb

## 2010-05-10 DIAGNOSIS — I82890 Acute embolism and thrombosis of other specified veins: Secondary | ICD-10-CM | POA: Insufficient documentation

## 2010-05-10 DIAGNOSIS — M7989 Other specified soft tissue disorders: Secondary | ICD-10-CM | POA: Insufficient documentation

## 2010-05-10 DIAGNOSIS — M79669 Pain in unspecified lower leg: Secondary | ICD-10-CM

## 2010-05-10 DIAGNOSIS — M79609 Pain in unspecified limb: Secondary | ICD-10-CM | POA: Insufficient documentation

## 2010-05-10 MED ORDER — ENOXAPARIN SODIUM 30 MG/0.3ML ~~LOC~~ SOLN: 40.0000 mg | Freq: Two times a day (BID) | SUBCUTANEOUS | Status: AC

## 2010-05-10 NOTE — Telephone Encounter (Signed)
Ok to add on to schedule

## 2010-05-10 NOTE — Telephone Encounter (Signed)
[name] Little, can you please help get pt scheduled per MEN? Thanks!

## 2010-05-10 NOTE — Telephone Encounter (Signed)
Pt seen today

## 2010-05-10 NOTE — Progress Notes (Signed)
  Subjective:    Patient ID: [name] Little, female    DOB: [birth year], 77 y.o.   MRN: [id number]  HPI [name] Little presents with a 2 day history of leg pain left calve. She had leg pain in the right leg late March and had a negative LE venous doppler. She has had no precipitating event: no long trips, no trauma. She has Factor V Leiden deficiency and is on coumadin with last INR 3.5 March 23rd.    Review of Systems  Constitutional: Negative.   HENT: Negative.   Eyes: Negative.   Respiratory: Negative for apnea, shortness of breath and wheezing.   Cardiovascular: Negative for chest pain and leg swelling.  Gastrointestinal: Negative for abdominal pain, blood in stool and abdominal distention.  Genitourinary: Negative.  Negative for dysuria, urgency, frequency and difficulty urinating.  Musculoskeletal: Negative.  Negative for arthralgias and gait problem.  Skin: Negative.   Neurological: Negative.   Hematological: Negative.   Psychiatric/Behavioral: Negative.    Review of Systems  Constitutional:  Negative for fever, chills, activity change and unexpected weight change.  HENT:  Negative for hearing loss, ear pain, congestion, neck stiffness and postnasal drip.   Eyes: Negative for pain, discharge and visual disturbance.  Respiratory: Negative for chest tightness and wheezing.   Cardiovascular: Negative for chest pain and palpitations.       [No decreased exercise tolerance Gastrointestinal: [No change in bowel habit. No bloating or gas. No reflux or indigestion Genitourinary: Negative for urgency, frequency, flank pain and difficulty urinating.  Musculoskeletal: Negative for myalgias, back pain, arthralgias and gait problem.  Neurological: Negative for dizziness, tremors, weakness and headaches.  Hematological: Negative for adenopathy.  Psychiatric/Behavioral: Negative for behavioral problems and dysphoric mood.       Objective:   Physical Exam  Constitutional: She is oriented  to person, place, and time. She appears well-developed and well-nourished. No distress.  HENT:  Head: Normocephalic and atraumatic.  Eyes: Conjunctivae and EOM are normal. Pupils are equal, round, and reactive to light. Left eye exhibits no discharge.  Neck: Neck supple.  Cardiovascular: Normal rate and regular rhythm.   Pulmonary/Chest: Effort normal and breath sounds normal. No respiratory distress. She has no wheezes. She has no rales.  Musculoskeletal:       Left leg is swollen, mildly erythematous, + Homan's sign, calve tender to palpation. Circumference left calve 15.2" vs 14.5" right.  Neurological: She is alert and oriented to person, place, and time. She has normal reflexes.  Skin: Skin is warm and dry.          Assessment & Plan:  1. Leg pain - patient with Factor V Leiden deficiency with INR 3.5 March 23rd but INR April 9 was 1.7. Stat LE venous doppler is positive for DVT left popliteal level.  Plan - Lovenox 40mg  Schulenburg bid until INR therapeutic           Adjust coumadin as instructed by coag clinic with follow-up visit Friday - April 13th

## 2010-05-13 ENCOUNTER — Ambulatory Visit (INDEPENDENT_AMBULATORY_CARE_PROVIDER_SITE_OTHER): Payer: Medicare Other | Admitting: *Deleted

## 2010-05-13 DIAGNOSIS — D682 Hereditary deficiency of other clotting factors: Secondary | ICD-10-CM

## 2010-05-13 DIAGNOSIS — Z86718 Personal history of other venous thrombosis and embolism: Secondary | ICD-10-CM

## 2010-05-13 LAB — POCT INR: INR: 3.4

## 2010-05-17 ENCOUNTER — Ambulatory Visit: Payer: Medicare Other | Admitting: Internal Medicine

## 2010-05-19 ENCOUNTER — Encounter: Payer: Medicare Other | Admitting: *Deleted

## 2010-05-20 ENCOUNTER — Ambulatory Visit (INDEPENDENT_AMBULATORY_CARE_PROVIDER_SITE_OTHER): Payer: Medicare Other | Admitting: *Deleted

## 2010-05-20 DIAGNOSIS — D682 Hereditary deficiency of other clotting factors: Secondary | ICD-10-CM

## 2010-05-20 DIAGNOSIS — Z86718 Personal history of other venous thrombosis and embolism: Secondary | ICD-10-CM

## 2010-05-20 LAB — POCT INR: INR: 2.9

## 2010-06-03 ENCOUNTER — Encounter: Payer: Medicare Other | Admitting: *Deleted

## 2010-06-06 ENCOUNTER — Ambulatory Visit (INDEPENDENT_AMBULATORY_CARE_PROVIDER_SITE_OTHER): Payer: Medicare Other | Admitting: *Deleted

## 2010-06-06 DIAGNOSIS — D682 Hereditary deficiency of other clotting factors: Secondary | ICD-10-CM

## 2010-06-06 DIAGNOSIS — Z86718 Personal history of other venous thrombosis and embolism: Secondary | ICD-10-CM

## 2010-06-06 LAB — POCT INR: INR: 3

## 2010-06-14 NOTE — Op Note (Signed)
NAMERASHEKA, DENARD NO.:  [id number]   MEDICAL RECORD NO.:  [id number]          PATIENT TYPE:  INP   LOCATION:  5151                         FACILITY:  [facility]   PHYSICIAN:  Currie Paris, M.D.DATE OF BIRTH:  02-28-1933   DATE OF PROCEDURE:  07/30/2008  DATE OF DISCHARGE:                               OPERATIVE REPORT   PREOPERATIVE DIAGNOSIS:  Carcinoma, right breast, lower outer quadrant,  clinical stage I.   POSTOPERATIVE DIAGNOSIS:  Carcinoma, right breast, lower outer quadrant,  clinical stage I.   OPERATION:  Needle-guided right lumpectomy with blue dye injection and  axillary sentinel lymph node biopsy.   SURGEON:  Currie Paris, MD   ANESTHESIA:  General.   CLINICAL HISTORY:  This is a 77 year old lady recently found to have  appears to be a small invasive ductal carcinoma of the right breast  lower outer quadrant.  After discussion with the patient, she elected to  proceed to lumpectomy and sentinel node evaluation.   DESCRIPTION OF PROCEDURE:  The patient was seen in the holding area and  had no further questions.  Her mammogram localization films were  reviewed.  The guidewire was in the appropriate location.  I identified  and marked the right breast as the operative side.   The patient was taken to the operating room, and after satisfactory  general anesthesia had been obtained the time-out was done.  I injected  5 mL of dilute methylene blue subareolarly and massaged this in.  A full  prep and drape was done.   I started by evaluating the axilla.  I used the Neoprobe to identify a  hot area and made a transverse incision and divided the subcutaneous  tissue until I could identify axillary fat.  Using Neoprobe to direct my  dissection, I almost immediately found a blue lymphatic leading to a  blue lymph node which was excised and had counts about 500-600.  With  that out, I was unable to find any other counts of above about  15-20.  I  saw no other blue lymphatics and palpated no abnormal nodes.  I put a  moist pack here temporarily.   Attention was turned to the breast.  The guidewire entered laterally,  tracked in a horizontal direction medially, and was in the lower outer  quadrant.  I made a transverse incision directly over the guidewire  tract.  I then elevated the skin in all directions for sure a few  centimeters.  I then manipulated the guidewire into the wound.   Using an Allis clamp, I was then able to take a wide area of tissue  around the guidewire, and as I got to about the mid position of the  guidewire, I could feel a mass within the specimen and I thought at  least by palpation I was around it in all directions.  Anteriorly, I had  taken very thin skin flaps with only just a little tiny bit of  subcutaneous tissue left behind and deep I went down to the chest wall.   Once the specimen was  out, I inked it for orientation purposes and we  did a specimen mammogram which showed the clip to be apparently in the  center of the specimen.  This was sent to Pathology after concurring  review of the film by the radiologist.   I made sure everything was dry using cautery and suture ligatures.  I  freed the breast up from the chest wall a little bit and closed the very  deeper layers, then more superficially, and finally the skin and  subcutaneous tissues with 4-0 Monocryl subcuticular and Dermabond.   Attention was turned back to the axillary incision.  The pathologist had  reported that the lymph node was negative.  I put some Marcaine in here  and closed with 3-0 Vicryl, 4-0 Monocryl subcuticular, and Dermabond.   The patient tolerated the procedure well, and there were no  complications.  All counts were correct.      Currie Paris, M.D.  Electronically Signed     CJS/MEDQ  D:  07/30/2008  T:  07/31/2008  Job:  [id number]

## 2010-06-14 NOTE — Discharge Summary (Signed)
[name] Little, [name] Little NO.:  [id number]   MEDICAL RECORD NO.:  [id number]           PATIENT TYPE:   LOCATION:                                 FACILITY:   PHYSICIAN:  Currie Paris, M.D.DATE OF BIRTH:  [birth year]   DATE OF ADMISSION:  DATE OF DISCHARGE:                               DISCHARGE SUMMARY   FINAL DIAGNOSES:  1. Carcinoma of the right breast stage I (T1c, N0).  2. Hypertension.  3. History of factor V Leiden deficiency.   CLINICAL HISTORY:  [name] is a 77 year old lady recently diagnosed  with a right breast cancer.  She was admitted for partial mastectomy  (lumpectomy) and sentinel lymph node evaluation.   HOSPITAL COURSE:  The patient was admitted on July 1 and taken to the  operating room where a needle localized lumpectomy was done as well as  sentinel node evaluation.  Clinically, we had negative margins and the  sentinel node appeared negative.   The patient was hospitalized for close monitoring since she has been  anticoagulated, but did well.  The morning following the surgery, she  was having minimal pain and her wound looked okay.  She had no evidence  of bleeding problem and was able to be discharged to restart her Lovenox  and back to her Coumadin managed as an outpatient.  She will also be  followed in my office in approximately a week for a wound check.   Pathology report confirmed a ductal carcinoma which was invasive, 1.5  cm, grade 1 with some associated DCIS.  There was no angiolymphatic  invasion noted.  All resection margins were negative.  One lymph node  was negative for metastatic carcinoma.  The cancer was ER positive 100%,  PR positive with 100%, Ki-67 low at 8%, and a negative HER-2.      Currie Paris, M.D.  Electronically Signed     CJS/MEDQ  D:  09/09/2008  T:  09/09/2008  Job:  [id number]

## 2010-06-14 NOTE — Letter (Signed)
May 06, 2007    [name] Little  [address]  [place], [place]  [address]   RE:  [name] Little, USERY  MRN:  [id number]  /  DOB:  August 10, 1933   Dear [name]:   I am writing in regards to your CardioNet event recorder, the report of  which I received just the other day.  This study was read out by Dr.  Lewayne Bunting, one of our electrophysiologists, a specialist in irregular  heart rhythms.  He reviewed this study and felt that you had  predominantly a normal sinus rhythm.  There were episodes of normal  sinus tachycardia which means a rapid heart but a normal rhythm.  He  also noted there were frequent premature atrial contractions.   This study indicates that there were no significant abnormal heart  rhythms and certainly nothing that would be of danger or pose any  significant risk to you.  I am sure that these episodes are symptomatic  and if you continue to have episodes of rapid heart rate, we can  certainly consider adjusting your medications to try to control your  heart rate.   If you have any additional questions about this study, I would be happy  to see you in the office and talk about this study in more detail.    Sincerely,      [name] Gess. Norins, MD  Electronically Signed   MEN/MedQ  DD: 05/06/2007  DT: 05/06/2007  Job #: [id number]

## 2010-06-14 NOTE — Assessment & Plan Note (Signed)
[facility]                            CARDIOLOGY OFFICE NOTE   [name] Little, [name] Little                       MRN:          [id number]  DATE:03/25/2008                            DOB:          02-08-1933     [name] Little is a pleasant 77 year old female with past medical history  of dissecting aortic aneurysm, hypertension, factor V Leiden who I am  asked to evaluate for chest pain.  The patient otherwise has no prior  cardiac history.  She typically does not have dyspnea on exertion,  orthopnea, PND, pedal edema, presyncope, syncope, or exertional chest  pain.  She did lose her brother recently.  She states that during this  time, she developed some pain in her upper chest.  It is described as  pressure.  Lasted for several minutes, resolved spontaneously.  There  is no associated shortness breath, nausea, vomiting, or diaphoresis.  It  was not pleuritic, positional, nor is it related to food.  She has had  no pain since that time.  Also of note that she occasionally feels  discomfort in her chest when she lays on her left side.  It resolves  with changing positions.  Because of the above, we were asked to further  evaluate.   MEDICATIONS:  1. Coumadin as directed.  2. Niaspan 1500 mg p.o. daily.  3. Fish oil.  4. Lisinopril 10 mg daily.  5. Hydrochlorothiazide 12.5 mg p.o. daily.  6. Folbee tab.  7. Magnesium.  8. Potassium.  9. Caltrate.  10.She also takes metoprolol 25 mg p.o. q.8 p.r.n.   ALLERGIES:  She has an allergy to SULFA, CEFTIN, and ULTRAM.   SOCIAL HISTORY:  She is married with four children.  She does not smoke  nor does she consume alcohol.   FAMILY HISTORY:  Significant for her mother who had a myocardial  infarction.   PAST MEDICAL HISTORY:  There is no diabetes mellitus, but there is  hypertension and hyperlipidemia.  She has a history of palpitations with  a cardiac monitor in the past that was unremarkable by report, but  I do  not have those results available.  There is a history of a small  thoracic aneurysm (her last CT on July 21, 2005, showed a borderline 4  cm ascending thoracic aortic aneurysm without change).  Dr. Debby Bud is  following this.  She also has a history of factor V Leiden with previous  pulmonary embolus.  She has had a prior hysterectomy as well as right  hip replacement.  She has had a previous melanoma removed from the right  calf.   REVIEW OF SYSTEMS:  She denies any headaches or fevers or chills.  There  is no productive cough or hemoptysis.  There is no dysphagia,  odynophagia, melena, or hematochezia.  There is no dysuria or hematuria.  There is no rash or seizure activity.  There is no orthopnea, PND, or  pedal edema.  The remaining systems are negative.   PHYSICAL EXAMINATION:  VITAL SIGNS:  Blood pressure of 132/70 and pulse  is 74.  She weighs 168 pounds.  GENERAL:  She is well developed, well nourished, in no acute distress.  Skin is warm and dry.  She does not appear depressed.  There is no  peripheral clubbing.  BACK:  Normal.  HEENT:  Normal eyelids.  NECK:  Supple a normal upstroke bilaterally.  No bruits.  There is no  jugular venous distention.  I can appreciate thyromegaly.  CHEST:  Clear to auscultation, no expansion.  CARDIOVASCULAR:  Regular rhythm.  Normal S1 and S2.  There are no  murmurs, rubs, or gallops noted.  ABDOMEN:  Nontender, nondistended.  Positive bowel sounds.  No  hepatosplenomegaly.  No mass appreciated.  There is no abdominal bruit.  She has 2+ femoral pulses bilaterally.  No bruits.  EXTREMITIES:  No edema palpate.  No cords.  She has 2+ posterior tibial  pulses bilaterally.  NEUROLOGIC:  Grossly intact.   Her electrocardiogram shows a sinus rhythm at a rate of 74.  There is a  left interfascicular block.  There are no ST changes.   DIAGNOSES:  1. Atypical chest pain - the patient's symptoms are somewhat atypical.      We will proceed  with an adenosine Myoview.  If it shows normal      perfusion, then we will not pursue further cardiac workup.  2. Factor five Leiden with history of deep venous thrombosis - she      will continue on her Coumadin.  This is being monitored in our      Coumadin Clinic.  3. Hyperlipidemia - she will continue on her Niaspan.  This has been      being managed by Dr. Debby Bud.  4. History of thoracic aneurysm (small) - this is being followed by      Dr. Debby Bud, and I will leave to him in timing a followup.  I do not      think her chest pain is suggestive of a dissection.  5. Hypertension - blood pressure is adequately controlled on present      medications.   We will see her back in approximately 1 year if her Myoview is normal.      Madolyn Frieze. Jens Som, MD, [facility]  Electronically Signed    BSC/MedQ  DD: 03/25/2008  DT: 03/25/2008  Job #: [id number]   cc:   Rosalyn Gess. Norins, MD

## 2010-06-17 NOTE — Assessment & Plan Note (Signed)
[facility]                               PULMONARY OFFICE NOTE   SUNDI, SLEVIN                       MRN:          [id number]  DATE:09/19/2005                            DOB:          02/15/1933    REASON FOR CONSULTATION:  Hemoptysis.   HISTORY:  This is a delightful 77 year old white female, never smoker, with  a remote history of DVT and also remote splenectomy for ITP who also has  protein S and C deficiency and is felt to be hypocoagulable and therefore  needing anticoagulation indefinitely.  She comes in today with new onset of  hemoptysis that has been occurring over the last five weeks in the setting  of a chronic cough that is typically first thing in the morning, with  sensation of too much throat mucus.  She actually does not typically cough  up much mucus but for the last five weeks has noted traces of blood clots in  her typical morning mucus.  She simply clears her throat and it is there  without any deep chest cough.  For that reason, she underwent ENT evaluation  first which found no source of upper airway bleeding and therefore, is seen  at Dr. Debby Bud' request.   The patient denies any dyspnea, pleuritic pain, myalgias, arthralgias,  orthopnea, PND, leg swelling, easing bruising or bleeding related to the use  of Coumadin.  She also takes aspirin daily (see medication list below).   PAST MEDICAL HISTORY:  1. Hyperlipidemia.  2. Hip replacement in 1999.  3. Hysterectomy in 1973.  4. Splenectomy in 1998.   ALLERGIES:  SULFA causes hives.   MEDICATIONS:  Taken in detail on the worksheet.  Significant for the fact  that she is on Coumadin with an INR of 2.4 within the last two weeks and  baby aspirin daily.   SOCIAL HISTORY:  She has never smoked.  Denies any usual travel, pet or  hobby history.   FAMILY HISTORY:  Recorded in detail and significant for the absence of a  malignancy or respiratory diseases.   REVIEW  OF SYSTEMS:  Taken in detail and are significant for the problems as  outlined above.   PHYSICAL EXAMINATION:  GENERAL APPEARANCE:  This is a pleasant, elderly  white female who does clear her throat frequently during the interview and  exam.  VITAL SIGNS:  She is afebrile with stable vital signs and looks quite  healthy.  HEENT:  Remarkable for the absence of excessive post nasal drainage or  cobblestoning.  Nasal turbinates are also normal with no crusting or obvious  upper airway bleeding.  Ear canals clear bilaterally.  NECK:  Supple without cervical adenopathy or tenderness.  Trachea is  midline.  No thyromegaly.  LUNGS:  Lung fields perfectly clear bilaterally to auscultation and  percussion.  CARDIOVASCULAR:  Regular rhythm without murmurs, rubs, or gallops.  ABDOMEN:  Soft and benign.  EXTREMITIES:  Warm without calf tenderness, clubbing, cyanosis, or edema.   Chest CT scan from July 25, 2004, shows a borderline 4 cm ascending thoracic  aneurysm with no change compared to previous studies and no evidence of any  other abnormalities other than possible gallbladder sludge versus  gallstones.   IMPRESSION:  Painless hemoptysis would best fit this patient's pattern  except for the fact that I am not sure that the actual blood is coming from  below the vocal cords.  Note that this is more of a throat clearing maneuver  that occurs each morning and has been going on for some time before it  became slightly bloody.  I suspect that what is happening is she is simply  clearing her throat vigorously in the morning and causing secondary bleeding  rather than bleeding into the lung during the night and then clearing that  blood in the morning.  Therefore, there are really two issues here.  (1)  Does she need to be anticoagulated chronically.  Based on Dr. Darrall Dears  note, it sounds like she does need Coumadin for the rest of her life but  would not necessarily need aspirin.  At this  point, I have asked her to stop  the aspirin completely.  If the bleeding resolves for two full weeks, then  perhaps she could go to every other day aspirin if Dr. Debby Bud feels it is  necessary.  (2) Does she need bronchoscopy.  At this point, I cannot offer  bronchoscopy unless she can stop Coumadin.  Since her CT scan is so benign,  I do not believe that bronchoscopy is going to be of any use anyway and  would recommend that she simply be dosed on the low side of INR.  If her  bleeding becomes much worse, then I would consider admitting her to the  hospital, reversing her Coumadin and bronchoscopy in the inpatient setting  with immediate reheparinization and coumadinization after bronchoscopy.   Finally, we have not yet answered the question as what is causing her  morning excess mucus.  She might have nocturnal reflux or post nasal drip  syndrome related to poorly controlled rhinitis.  In this setting, I really  do not believe fish oil is a great idea bedtime did not change any of her  maintenance medicines for today.  I would consider pursuing treatment of  reflux perhaps by giving her Zegerid at bedtime to see if the cough can be  eliminated on a six-week trial basis and/or a trial of nocturnal nasal  steroids.  I did not institute, however, either of these interventions today  because I wanted to sort out the issue about the bleeding and response to  stopping aspirin first.  Pulmonary follow-up can be p.r.n.                                   Charlaine Dalton. Sherene Sires, MD, [facility]   MBW/MedQ  DD:  09/19/2005  DT:  09/20/2005  Job #:  [id number]

## 2010-06-17 NOTE — Discharge Summary (Signed)
[name] Little NO.:  [id number]   MEDICAL RECORD NO.:  [id number]          PATIENT TYPE:  INP   LOCATION:  0345                         FACILITY:  [facility]   PHYSICIAN:  Gordy Savers, M.D. LHCDATE OF BIRTH:  05-02-1933   DATE OF ADMISSION:  05/10/2004  DATE OF DISCHARGE:  05/13/2004                                 DISCHARGE SUMMARY   FINAL DIAGNOSES:  Left lower extremity deep vein thrombosis with acute  pulmonary embolism.   ADDITIONAL DIAGNOSES:  1.  Urinary tract infection.  2.  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  3.  History of right calf melanoma.  4.  Hypercholesterolemia.   DISCHARGE MEDICATIONS:  1.  Lovenox 80 mg subcutaneously every 12 hours for five days.  2.  Coumadin 7.5 mg daily.   HISTORY OF PRESENT ILLNESS:  The patient is a 77 year old white female who  was admitted directly from the vascular laboratory after being noted to have  a left lower extremity deep vein thrombosis.  She presented with a 3-4 day  history of increasing left thigh pain, tenderness, and swelling, and also  had some mild shortness of breath the day prior to admission.   LABORATORY DATA AND HOSPITAL COURSE:  The patient was admitted to the  hospital where exam did reveal pain, soft tissue swelling, erythema, and  tenderness involving the left medial lower thigh.  A CT angiogram with  contrast was obtained that was positive for a pulmonary embolism.  Laboratory studies included a urine culture that revealed an E. coli  infection which was sensitive to Cipro.  The patient was treated with Cipro  for 48 hours.  White count was 10.6, hemoglobin and hematocrit normal.  During the hospital period, the patient was seen in consultation by the  pharmacy service where heparin levels were monitored, and the patient was  placed on Coumadin 7.5 mg daily.  At the time of discharge, INR was not  therapeutic.  The hospital course  was marked by steady improvement of her  pain and tenderness involving her left medial thigh.   DISPOSITION:  The patient will be discharged today on 7.5 of Coumadin daily.  She is aware of the necessity for close outpatient followup and is scheduled  to see [name] Little, M.D. in three days. She will be discharged on a  five-day course of Lovenox 80 mg subcutaneously every 12 hours.  She will  continue elevation of the left leg and warm compresses.   CONDITION ON DISCHARGE:  Stable.     PFK/MEDQ  D:  05/13/2004  T:  05/13/2004  Job:  [id number]

## 2010-06-17 NOTE — Letter (Signed)
August 23, 2005     [name] Little. Ezzard Standing, MD  [address]  [place], [place] [address]   RE:  [name], [name]  MRN:  [id number]  /  DOB:  [birth year]   Dear [name]:   Thank you very much for seeing [name], a very pleasant 77 year old  woman, for persist net early morning hemoptysis.  [name] is followed in  the practice for history of ITP status post splenectomy, history of DVT,  history of dissecting aortic aneurysm which is being followed conservative.  The patient also has history of a partial hysterectomy in the past, and she  is status post right total hip repair.   The patient was seen for hemoptysis July 24, 2005.  She reports that in the  mornings when she coughs, she will have mucus with blood and sometimes more  blood than mucus.  This is painless.  She has had no significant symptoms  including fever, sore throat, or respiratory infections.  She has never been  a tobacco user.   The patient was sent for CT scan of the chest July 25, 2005, which was read  out as showing no evidence of mass or other active disease within the  thorax.  Borderline 4 cm thinning throughout the aortic aneurysm without  change from previous studies.  She does have a history of gallstones.   The patient continues to have problems with early morning hemoptysis.  Again, she denies any fevers, sweats, or chills.  She has had no sore throat  or other symptoms with this. I am now concerned for an upper airway lesion.  As a side note, the patient does report she has had some very mild  dysphagia, although she has had no history of GI problems; specifically,  regurgitation or reflux.   CURRENT MEDICATIONS:  1.  Vitamin C daily.  2.  Coumadin as directed.  3.  Fish oil daily.  4.  Niaspan 500 mg 2 tablets daily.  5.  Baby aspirin 81 mg daily.  6.  Foltx daily.  7.  Lopid 600 mg a.m., 300 mg p.m.  8.  Benicar 40 mg daily.  9.  Hydrochlorothiazide 12.5 mg daily.  10.  Cyanacoba pyridoxine daily.  11. She also uses Rhinocort Aqua on a p.r.n. basis.   Her examination in the office today is unremarkable, being afebrile at 97.9  with a normal blood pressure.  She has no respiratory abnormalities on exam.   If I can provide any additional information in regards to this nice patient,  please do not hesitate to contact me.  I appreciate your assistance in  evaluating this hemoptysis and look forward to hearing from you.    Sincerely,      Rosalyn Gess. Norins, MD   MEN/MedQ  DD:  08/23/2005  DT:  08/23/2005  Job #:  [id number]

## 2010-06-17 NOTE — Assessment & Plan Note (Signed)
[facility]                             PRIMARY CARE OFFICE NOTE   PHENIX, GREIN                       MRN:          [id number]  DATE:12/11/2005                            DOB:          [birth year]    [name] Little is followed for hypertension, hyperlipidemia.  She has a known  history of dissecting ascending aortic aneurysm.  The patient presents today  to report she is having a problem with blood pressure control with her blood  pressure running consistently elevated in the 135 range.  We have made  several changes in her medications reducing her Benicar from 40 to 20 mg  daily and discontinuing hydrochlorothiazide.   The patient reports that she ran out of Lopid, and since stopping Lopid her  headaches, which have been a problem recently, have disappeared.   The patient reports that she has been having some pain in her back at the  scapular region on the left with her blood pressure running high.   CURRENT MEDICATIONS:  1. Coumadin as directed.  2. Niaspan 500 mg q. day.  3. Fish oil daily.  4. Calcium daily.  5. Lopid 600 mg b.i.d., currently out.  6. Benicar 20 mg daily.   REVIEW OF SYSTEMS:  Otherwise unremarkable.   EXAMINATION:  Temperature was 97.4, blood pressure 135/71, pulse is 59,  weight 158.  GENERAL:  Well-nourished, well-developed woman in no acute distress.  CHEST:  The patient is moving air well with no rales, wheezes, or rhonchi.  CARDIOVASCULAR:  Quiet precordium.  She had a regular rate and rhythm  without murmurs, rubs, or gallops.  She had no carotid bruits.  No further examination conducted.   ASSESSMENT AND PLAN:  1. Hypertension.  Goal for this patient is to have a systolic blood      pressure 115 to 120 because of her ascending dissecting aortic      aneurysm.  Plan:  We will discontinue Benicar because of side effects      of light-headedness and weakness, and try lisinopril 10 mg daily.  Will      resume  low-dose diuretic with hydrochlorothiazide 12.5 mg daily.  The      patient is to track her blood pressures and keep me informed.  With a      low dose of lisinopril, we may need to titrate up.  2. Lipids.  The patient is going to retry Lopid to see if she has a      recurrent headache, and if so we need to abandon Lopid in favor of      other products, such as Welchol or TriCor in combination with Niaspan.  3. Back pain.  The patient's examination does not suggest any acute      vascular problems, such as progressive dissection.  I think it may be      hypertension-related.  We will see if her symptoms resolve with better      control of her blood pressure.   The patient is to keep me informed by mail, phone, or e-mail as to  her blood  pressure readings.  We will make appropriate adjustments.     [name] Gess Norins, MD  Electronically Signed    MEN/MedQ  DD: 12/11/2005  DT: 12/11/2005  Job #: [id number]

## 2010-06-17 NOTE — H&P (Signed)
[name], [name] NO.:  [id number]   MEDICAL RECORD NO.:  [id number]          PATIENT TYPE:  INP   LOCATION:  0345                         FACILITY:  [facility]   PHYSICIAN:  Corwin Levins, M.D. LHCDATE OF BIRTH:  1933/10/28   DATE OF ADMISSION:  05/10/2004  DATE OF DISCHARGE:                                HISTORY & PHYSICAL   CHIEF COMPLAINT:  Direct admission from the vascular lab with an incidental  finding of left lower extremity DVT.   HISTORY OF PRESENT ILLNESS:  [name] is a 77 year old white female with  recent right lower extremity pain.  Was sent to [facility] vascular lab for  arterial Dopplers, who coincidentally complained at the time she presented  with a 3-4 day increasing left thigh redness, swelling, pain, and  tenderness.  Venous Doppler showed free-floating left saphenous vein DVT,  now for admission.  She had some mild shortness of breath yesterday with  exertion and minimal symptoms today.   PAST MEDICAL HISTORY/ILLNESSES:  1.  History of DVT in 1998.  2.  Status post splenectomy for ITP.  3.  ITP, as above.  4.  History of AVN, status post right hip TAH in 1999 secondary to      prednisone use for the ITP.  5.  Status post TAH in 1973.  6.  History of right calf melanoma in 1983.  7.  Hypercholesterolemia.  8.  History of recurrent UTI.   ALLERGIES:  1.  LIPITOR.  2.  SULFA.  3.  NIACIN.   CURRENT MEDICATIONS:  None.   SOCIAL HISTORY:  No tobacco or alcohol.  Married and lives with husband.   FAMILY HISTORY:  Negative for DVT or clotting disorders.   REVIEW OF SYSTEMS:  Questionable mild dysuria for the last several days.   PHYSICAL EXAMINATION:  VITAL SIGNS:  Blood pressure 144/72, respirations 18,  pulse 82.  GENERAL:  [name] is a 77 year old white female.  HEENT:  Sclerae are clear.  TM's are clear.  Pharynx benign.  NECK:  No lymphadenopathy, JVD, thyromegaly.  CHEST:  No rales or wheezing.  CARDIOVASCULAR:   Regular rate and rhythm.  ABDOMEN:  Soft and nontender.  Positive bowel sounds.  EXTREMITIES:  Left lower extremity thigh, primarily just above the knee,  more medial and posteriorly with 2-3+ redness, swelling, and tenderness.   LABS:  None except for the venous Doppler, as above.   ASSESSMENT/PLAN:  1.  Left lower extremity deep venous thrombosis, now with the second episode      with several risk factors, as above.  Will check baseline labs,      including INR, PTT.  Begin IV heparin today.  Will plan to start      Coumadin tomorrow.  The patient knows and is willing to self-administer      shots.  Will need to check with the insurance to see if it will pay for      the home Lovenox 80 mg b.i.d.  Will need to consider discharge home on      April 12th if  the insurance pays for Lovenox.  Will also check chest CT      with PE protocol.  2.  Genitourinary:  Check UA C&S.      JWJ/MEDQ  D:  05/10/2004  T:  05/10/2004  Job:  [id number]   cc:   Valentino Hue. Magrinat, M.D.  [address] [facility]  [place]  [place] [address]  Fax: [phone]

## 2010-06-20 ENCOUNTER — Ambulatory Visit (INDEPENDENT_AMBULATORY_CARE_PROVIDER_SITE_OTHER): Payer: Medicare Other | Admitting: *Deleted

## 2010-06-20 ENCOUNTER — Other Ambulatory Visit: Payer: Medicare Other

## 2010-06-20 DIAGNOSIS — D682 Hereditary deficiency of other clotting factors: Secondary | ICD-10-CM

## 2010-06-20 DIAGNOSIS — Z86718 Personal history of other venous thrombosis and embolism: Secondary | ICD-10-CM

## 2010-06-20 DIAGNOSIS — A0472 Enterocolitis due to Clostridium difficile, not specified as recurrent: Secondary | ICD-10-CM

## 2010-06-20 LAB — POCT INR: INR: 2.3

## 2010-06-21 ENCOUNTER — Telehealth: Payer: Self-pay | Admitting: *Deleted

## 2010-06-21 LAB — CLOSTRIDIUM DIFFICILE BY PCR: Toxigenic C. Difficile by PCR: DETECTED — CR

## 2010-06-21 NOTE — Telephone Encounter (Signed)
Pt aware and will come in Thursday 06/23/2010

## 2010-06-21 NOTE — Telephone Encounter (Signed)
Message copied by Harlow Mares on Tue Jun 21, 2010  1:43 PM ------      Message from: Jarold Motto, DAVID      Created: Tue Jun 21, 2010  1:39 PM       See me,copy Dr. Debby Bud

## 2010-06-23 ENCOUNTER — Other Ambulatory Visit: Payer: Medicare Other

## 2010-06-23 ENCOUNTER — Other Ambulatory Visit (INDEPENDENT_AMBULATORY_CARE_PROVIDER_SITE_OTHER): Payer: Medicare Other | Admitting: Gastroenterology

## 2010-06-23 ENCOUNTER — Ambulatory Visit (INDEPENDENT_AMBULATORY_CARE_PROVIDER_SITE_OTHER): Payer: Medicare Other | Admitting: Gastroenterology

## 2010-06-23 ENCOUNTER — Encounter: Payer: Self-pay | Admitting: Gastroenterology

## 2010-06-23 VITALS — BP 118/64 | HR 76 | Ht 63.0 in | Wt 166.0 lb

## 2010-06-23 DIAGNOSIS — N390 Urinary tract infection, site not specified: Secondary | ICD-10-CM

## 2010-06-23 DIAGNOSIS — A0472 Enterocolitis due to Clostridium difficile, not specified as recurrent: Secondary | ICD-10-CM

## 2010-06-23 DIAGNOSIS — R300 Dysuria: Secondary | ICD-10-CM

## 2010-06-23 LAB — URINALYSIS, ROUTINE W REFLEX MICROSCOPIC
Bilirubin Urine: NEGATIVE
Hgb urine dipstick: NEGATIVE
Ketones, ur: NEGATIVE
Nitrite: NEGATIVE
Specific Gravity, Urine: 1.015 (ref 1.000–1.030)
Total Protein, Urine: NEGATIVE
Urine Glucose: NEGATIVE
Urobilinogen, UA: 0.2 (ref 0.0–1.0)
pH: 6 (ref 5.0–8.0)

## 2010-06-23 MED ORDER — SACCHAROMYCES BOULARDII 250 MG PO CAPS: ORAL_CAPSULE | ORAL | Status: DC

## 2010-06-23 MED ORDER — VANCOMYCIN HCL 125 MG PO CAPS: ORAL_CAPSULE | ORAL | Status: DC

## 2010-06-23 NOTE — Patient Instructions (Signed)
Your prescription(s) have been sent to you pharmacy.  Take the vancomycin as directed on your medication list.  Take Florastor every day while on Vancomycin.  Make office visit to come see Dr Jarold Motto in 2 months.

## 2010-06-23 NOTE — Progress Notes (Signed)
History of Present Illness: This is a 77 year old caucasion l female with hypercoagulability status post splenectomy followed by Dr. Illene Regulus and Dr. Arlice Colt oncology. She is recently been treated for C. difficile infection with 6 weeks of tapering metronidazole and probiotic therapy. Within 3 days of discontinuing metronidazole, she has had recurrence of her diarrhea without rectal bleeding or abdominal pain. She denies systemic complaints. She apparently has a long history recurrent urinary tract infections. She alleges that she cannot have any urologic symptomatology since her splenectomy, and that the only way she has been diagnosed with UTIs is by urine culture    Current Medications, Allergies, Past Medical History, Past Surgical History, Family History and Social History were reviewed in [employer] record.   Assessment and plan: Relapse of C. difficile infection confirm a recent stool exam for C. difficile by PCR. I placed her on a tapering dose of vancomycin over 2 months. Initially we will do 125 mg 4 times a day for 2 weeks, then 125 mg 3 times a day for 2 weeks, then one 25 mg twice a day for 2 weeks, then daily for 2 weeks. She will see me in 6 weeks' time, and is to call if she has a relapse or worsening of her problems. Other possible interventions would be to use Dificid which is a new antibiotic specific for C. difficile, consider Xifaxan therapy which in my experience has worked well with refractory C. difficile infections, or consider infectious disease referral which I do not think we need this. With her splenectomy, vascular be sure that she is up-to-date on her Pneumovax. Urinalysis and urine culture ordered today for review. Encounter Diagnosis  Name Primary?  . C. difficile colitis Yes

## 2010-06-24 ENCOUNTER — Telehealth: Payer: Self-pay

## 2010-06-24 NOTE — Telephone Encounter (Signed)
Pt called stating she started on Vancomycin 125mg  PO tapering dosage x 8 weeks for C-Diff. Advised pt this should not interfere with her Coumadin and to keep her previously scheduled f/u appt, no need for sooner f/u.

## 2010-06-25 LAB — URINE CULTURE: Colony Count: >=100000

## 2010-06-29 ENCOUNTER — Encounter (INDEPENDENT_AMBULATORY_CARE_PROVIDER_SITE_OTHER): Payer: Self-pay | Admitting: Surgery

## 2010-07-04 ENCOUNTER — Other Ambulatory Visit: Payer: Self-pay | Admitting: Internal Medicine

## 2010-07-07 ENCOUNTER — Other Ambulatory Visit: Payer: Self-pay | Admitting: Internal Medicine

## 2010-07-12 ENCOUNTER — Encounter (HOSPITAL_BASED_OUTPATIENT_CLINIC_OR_DEPARTMENT_OTHER): Payer: Medicare Other | Admitting: Oncology

## 2010-07-12 ENCOUNTER — Other Ambulatory Visit: Payer: Self-pay | Admitting: Oncology

## 2010-07-12 DIAGNOSIS — D6859 Other primary thrombophilia: Secondary | ICD-10-CM

## 2010-07-12 DIAGNOSIS — C50519 Malignant neoplasm of lower-outer quadrant of unspecified female breast: Secondary | ICD-10-CM

## 2010-07-12 LAB — COMPREHENSIVE METABOLIC PANEL
ALT: 21 U/L (ref 0–35)
AST: 32 U/L (ref 0–37)
Albumin: 4 g/dL (ref 3.5–5.2)
Alkaline Phosphatase: 49 U/L (ref 39–117)
BUN: 18 mg/dL (ref 6–23)
CO2: 24 mEq/L (ref 19–32)
Calcium: 9.6 mg/dL (ref 8.4–10.5)
Chloride: 107 mEq/L (ref 96–112)
Creatinine, Ser: 0.86 mg/dL (ref 0.50–1.10)
Glucose, Bld: 101 mg/dL — ABNORMAL HIGH (ref 70–99)
Potassium: 4.1 mEq/L (ref 3.5–5.3)
Sodium: 139 mEq/L (ref 135–145)
Total Bilirubin: 0.4 mg/dL (ref 0.3–1.2)
Total Protein: 7 g/dL (ref 6.0–8.3)

## 2010-07-12 LAB — CBC WITH DIFFERENTIAL/PLATELET
BASO%: 0.4 % (ref 0.0–2.0)
Basophils Absolute: 0 10*3/uL (ref 0.0–0.1)
EOS%: 1.6 % (ref 0.0–7.0)
Eosinophils Absolute: 0.1 10*3/uL (ref 0.0–0.5)
HCT: 37.7 % (ref 34.8–46.6)
HGB: 12.8 g/dL (ref 11.6–15.9)
LYMPH%: 50.2 % — ABNORMAL HIGH (ref 14.0–49.7)
MCH: 32.1 pg (ref 25.1–34.0)
MCHC: 33.8 g/dL (ref 31.5–36.0)
MCV: 95 fL (ref 79.5–101.0)
MONO#: 1 10*3/uL — ABNORMAL HIGH (ref 0.1–0.9)
MONO%: 12.6 % (ref 0.0–14.0)
NEUT#: 2.7 10*3/uL (ref 1.5–6.5)
NEUT%: 35.2 % — ABNORMAL LOW (ref 38.4–76.8)
Platelets: 244 10*3/uL (ref 145–400)
RBC: 3.97 10*6/uL (ref 3.70–5.45)
RDW: 14.1 % (ref 11.2–14.5)
WBC: 7.6 10*3/uL (ref 3.9–10.3)
lymph#: 3.8 10*3/uL — ABNORMAL HIGH (ref 0.9–3.3)

## 2010-07-12 LAB — VITAMIN D 25 HYDROXY (VIT D DEFICIENCY, FRACTURES): Vit D, 25-Hydroxy: 28 ng/mL — ABNORMAL LOW (ref 30–89)

## 2010-07-12 LAB — CANCER ANTIGEN 27.29: CA 27.29: 19 U/mL (ref 0–39)

## 2010-07-14 ENCOUNTER — Other Ambulatory Visit: Payer: Self-pay | Admitting: Cardiology

## 2010-07-18 ENCOUNTER — Encounter: Payer: Medicare Other | Admitting: *Deleted

## 2010-07-19 ENCOUNTER — Ambulatory Visit (INDEPENDENT_AMBULATORY_CARE_PROVIDER_SITE_OTHER): Payer: Medicare Other | Admitting: *Deleted

## 2010-07-19 ENCOUNTER — Other Ambulatory Visit: Payer: Self-pay | Admitting: Oncology

## 2010-07-19 ENCOUNTER — Encounter (HOSPITAL_BASED_OUTPATIENT_CLINIC_OR_DEPARTMENT_OTHER): Payer: Medicare Other | Admitting: Oncology

## 2010-07-19 DIAGNOSIS — C50519 Malignant neoplasm of lower-outer quadrant of unspecified female breast: Secondary | ICD-10-CM

## 2010-07-19 DIAGNOSIS — Z7901 Long term (current) use of anticoagulants: Secondary | ICD-10-CM

## 2010-07-19 DIAGNOSIS — Z853 Personal history of malignant neoplasm of breast: Secondary | ICD-10-CM

## 2010-07-19 DIAGNOSIS — Z170 Estrogen receptor positive status [ER+]: Secondary | ICD-10-CM

## 2010-07-19 DIAGNOSIS — D6859 Other primary thrombophilia: Secondary | ICD-10-CM

## 2010-07-19 DIAGNOSIS — Z86718 Personal history of other venous thrombosis and embolism: Secondary | ICD-10-CM

## 2010-07-19 DIAGNOSIS — D682 Hereditary deficiency of other clotting factors: Secondary | ICD-10-CM

## 2010-07-19 LAB — POCT INR: INR: 1.4

## 2010-07-29 ENCOUNTER — Ambulatory Visit (INDEPENDENT_AMBULATORY_CARE_PROVIDER_SITE_OTHER): Payer: Medicare Other | Admitting: *Deleted

## 2010-07-29 DIAGNOSIS — Z86718 Personal history of other venous thrombosis and embolism: Secondary | ICD-10-CM

## 2010-07-29 DIAGNOSIS — D682 Hereditary deficiency of other clotting factors: Secondary | ICD-10-CM

## 2010-07-29 LAB — POCT INR: INR: 2.1

## 2010-08-08 ENCOUNTER — Ambulatory Visit
Admission: RE | Admit: 2010-08-08 | Discharge: 2010-08-08 | Disposition: A | Discharge status: Home / Self Care | Payer: Medicare Other | Source: Ambulatory Visit | Attending: Oncology | Admitting: Oncology

## 2010-08-08 DIAGNOSIS — Z853 Personal history of malignant neoplasm of breast: Secondary | ICD-10-CM

## 2010-08-11 ENCOUNTER — Encounter (INDEPENDENT_AMBULATORY_CARE_PROVIDER_SITE_OTHER): Payer: Self-pay | Admitting: Surgery

## 2010-08-11 ENCOUNTER — Ambulatory Visit (INDEPENDENT_AMBULATORY_CARE_PROVIDER_SITE_OTHER): Payer: Medicare Other | Admitting: *Deleted

## 2010-08-11 DIAGNOSIS — D682 Hereditary deficiency of other clotting factors: Secondary | ICD-10-CM

## 2010-08-11 DIAGNOSIS — Z86718 Personal history of other venous thrombosis and embolism: Secondary | ICD-10-CM

## 2010-08-11 LAB — POCT INR: INR: 1.6

## 2010-08-23 ENCOUNTER — Ambulatory Visit (INDEPENDENT_AMBULATORY_CARE_PROVIDER_SITE_OTHER): Payer: Medicare Other | Admitting: Gastroenterology

## 2010-08-23 ENCOUNTER — Other Ambulatory Visit (INDEPENDENT_AMBULATORY_CARE_PROVIDER_SITE_OTHER): Payer: Medicare Other

## 2010-08-23 ENCOUNTER — Encounter: Payer: Self-pay | Admitting: Gastroenterology

## 2010-08-23 VITALS — BP 128/64 | HR 60 | Ht 63.0 in | Wt 163.0 lb

## 2010-08-23 DIAGNOSIS — A0472 Enterocolitis due to Clostridium difficile, not specified as recurrent: Secondary | ICD-10-CM

## 2010-08-23 DIAGNOSIS — R197 Diarrhea, unspecified: Secondary | ICD-10-CM

## 2010-08-23 DIAGNOSIS — R6889 Other general symptoms and signs: Secondary | ICD-10-CM

## 2010-08-23 DIAGNOSIS — Z7901 Long term (current) use of anticoagulants: Secondary | ICD-10-CM

## 2010-08-23 LAB — CBC WITH DIFFERENTIAL/PLATELET
Basophils Absolute: 0.1 10*3/uL (ref 0.0–0.1)
Basophils Relative: 0.7 % (ref 0.0–3.0)
Eosinophils Absolute: 0.1 10*3/uL (ref 0.0–0.7)
Eosinophils Relative: 1.2 % (ref 0.0–5.0)
HCT: 41.8 % (ref 36.0–46.0)
Hemoglobin: 13.9 g/dL (ref 12.0–15.0)
Lymphocytes Relative: 50 % — ABNORMAL HIGH (ref 12.0–46.0)
Lymphs Abs: 3.7 10*3/uL (ref 0.7–4.0)
MCHC: 33.3 g/dL (ref 30.0–36.0)
MCV: 95.3 fl (ref 78.0–100.0)
Monocytes Absolute: 1.1 10*3/uL — ABNORMAL HIGH (ref 0.1–1.0)
Monocytes Relative: 14.3 % — ABNORMAL HIGH (ref 3.0–12.0)
Neutro Abs: 2.5 10*3/uL (ref 1.4–7.7)
Neutrophils Relative %: 33.8 % — ABNORMAL LOW (ref 43.0–77.0)
Platelets: 276 10*3/uL (ref 150.0–400.0)
RBC: 4.39 Mil/uL (ref 3.87–5.11)
RDW: 13.7 % (ref 11.5–14.6)
WBC: 7.4 10*3/uL (ref 4.5–10.5)

## 2010-08-23 NOTE — Progress Notes (Signed)
This is a extremely pleasant 77 year old Caucasian female with relapsing C. difficile colitis. She has been on tapering doses of vancomycin for several months and is currently asymptomatic except for chronic fatigue. She also has been on prolonged Florstar probiotic therapy. Review of her previous records show a difficult colonoscopy several years ago because of marked redundancy and tortuosity of her colon. Currently she denies rectal bleeding, but does have vague right lower quadrant discomfort. Says her fatigue she denies a systemic complaints or other gastrointestinal issues. She is on chronic Coumadin therapy for factor V fish and soup. She also is on diuretics and antihypertensives and B12 replacement. Labs from several months ago were reviewed and were all unremarkable.  Current Medications, Allergies, Past Medical History, Past Surgical History, Family History and Social History were reviewed in [employer] record.  Pertinent Review of Systems Negative   Physical Exam: Awake, alert, in no acute distress appearing younger than her stated age. Abdominal exam shows no distention, organomegaly, masses or tenderness. Peripheral extremities are unremarkable mental status is normal. She appears to be in a regular rhythm without murmurs gallops or rubs. Assessment and Plan: Hopefully her C. difficile infection has been eradicated. We will repeat stool exam for C. difficile toxin by PCR, also stool IFOB exam, Will Repeat Her metabolic profile and anemia profile per her chronic fatigue. She may need followup colonoscopy exam depending on her lab test and clinical course. As mentioned above, her previous colonoscopy was a difficult exam. She is to continue other medications as listed and reviewed her chart.  Please copy her primary care physician, referring physician, and pertinent subspecialists. Encounter Diagnoses  Name Primary?  . C. difficile colitis Yes  . Other general  symptoms

## 2010-08-23 NOTE — Patient Instructions (Signed)
Please go to the basement today for your labs.   

## 2010-08-24 ENCOUNTER — Other Ambulatory Visit: Payer: Medicare Other

## 2010-08-24 DIAGNOSIS — A0472 Enterocolitis due to Clostridium difficile, not specified as recurrent: Secondary | ICD-10-CM

## 2010-08-25 ENCOUNTER — Other Ambulatory Visit: Payer: Medicare Other

## 2010-08-25 ENCOUNTER — Other Ambulatory Visit: Payer: Self-pay | Admitting: Gastroenterology

## 2010-08-25 ENCOUNTER — Encounter: Payer: Medicare Other | Admitting: *Deleted

## 2010-08-25 DIAGNOSIS — Z1211 Encounter for screening for malignant neoplasm of colon: Secondary | ICD-10-CM

## 2010-08-25 LAB — FECAL OCCULT BLOOD, IMMUNOCHEMICAL: Fecal Occult Bld: NEGATIVE

## 2010-08-25 LAB — CLOSTRIDIUM DIFFICILE BY PCR: Toxigenic C. Difficile by PCR: NOT DETECTED

## 2010-08-26 ENCOUNTER — Ambulatory Visit (INDEPENDENT_AMBULATORY_CARE_PROVIDER_SITE_OTHER): Payer: Medicare Other | Admitting: *Deleted

## 2010-08-26 ENCOUNTER — Encounter (INDEPENDENT_AMBULATORY_CARE_PROVIDER_SITE_OTHER): Payer: Self-pay | Admitting: Surgery

## 2010-08-26 ENCOUNTER — Ambulatory Visit (INDEPENDENT_AMBULATORY_CARE_PROVIDER_SITE_OTHER): Payer: Medicare Other | Admitting: Surgery

## 2010-08-26 VITALS — BP 130/80 | HR 90 | Temp 97.4°F

## 2010-08-26 DIAGNOSIS — Z86718 Personal history of other venous thrombosis and embolism: Secondary | ICD-10-CM

## 2010-08-26 DIAGNOSIS — D682 Hereditary deficiency of other clotting factors: Secondary | ICD-10-CM

## 2010-08-26 DIAGNOSIS — Z853 Personal history of malignant neoplasm of breast: Secondary | ICD-10-CM

## 2010-08-26 LAB — POCT INR: INR: 3.6

## 2010-08-26 NOTE — Patient Instructions (Signed)
See your dermatologist for the small area on the left chest wall

## 2010-08-26 NOTE — Progress Notes (Signed)
08/26/2010  [name] Little is a 77 y.o.female who presents for routine followup of her Right Breast cancer, lower outer quadtrant, Stage Idiagnosed in July 2010 and treated with Lumpectomy, SLN andRadiation. She has no problems or concerns on either side.  PFSH She has had no significant changes since the last visit here.  ROS There have been no significant changes since the last visit here  General: The patient is alert, oriented, generally healty appearing, NAD. Mood and affect are normal.  Breasts: There is a loss of tissue on the right breast at the lumpectomy site in the lower outer quadrant however there is no dominant mass or suggestion of new breast cancer. The remainder of the right breast is normal. The left breast is normal. Lymphatics: She has no axillary or supraclavicular adenopathy on either side.  Extremities: Full ROM of the surgical side with no lymphedema noted.  Data Reviewed: Her mammogram was noted and it is unremarkable. I reviewed some of her old records and she has been treated by Dr. Jarold Motto for some C. difficile colitis that is now improved.  Impression:Stable exam, no problems noted  Plan: Will see again in one year.

## 2010-08-29 ENCOUNTER — Encounter: Payer: Self-pay | Admitting: *Deleted

## 2010-08-29 ENCOUNTER — Telehealth: Payer: Self-pay | Admitting: Gastroenterology

## 2010-08-29 NOTE — Telephone Encounter (Signed)
Pt called for her stool results. Informed pt her CDiff was normal as well as her stool results. Dr Jarold Motto, pt wants you to know she is still having " pain in her gut". Please advise.

## 2010-08-29 NOTE — Telephone Encounter (Signed)
Sent letter and note to Dr Jens Som for orders on stopping Coumadin for COLON on 09/14/10.

## 2010-08-29 NOTE — Telephone Encounter (Signed)
Let's request cardiology guidance from Dr, Jens Som.Marland KitchenMarland Kitchen

## 2010-08-29 NOTE — Telephone Encounter (Signed)
Colonoscopy with propofol in order

## 2010-08-29 NOTE — Telephone Encounter (Signed)
Dr Jarold Motto, pt is on Coumadin for DVT's- does she need to stop prior to COLON on 09/14/10? Thanks.

## 2010-08-30 ENCOUNTER — Telehealth: Payer: Self-pay | Admitting: *Deleted

## 2010-08-30 ENCOUNTER — Other Ambulatory Visit: Payer: Self-pay | Admitting: *Deleted

## 2010-08-30 MED ORDER — ENOXAPARIN SODIUM 120 MG/0.8ML ~~LOC~~ SOLN: 110.0000 mg | SUBCUTANEOUS | Status: DC

## 2010-08-30 NOTE — Telephone Encounter (Signed)
Pt  Having Colonoscopy on 09/14/10, she will take last dose on 09/09/10 of coumadin. Start Lovenox 110mg s SQ Q24hrs on 09/11/10. Continue Lovenox until 09/13/10 which is last shot. Colonoscopy on 09/14/10, start coumadin that night if ok with GI doctor. Start 5mg s daily til follow up on 09/19/10. Restart Lovenox 110mg s SQ on 09/15/10 q24hrs til appt on 09/19/10.

## 2010-08-30 NOTE — Telephone Encounter (Signed)
Message copied by Raul Del on Tue Aug 30, 2010  4:48 PM ------      Message from: Florene Glen      Created: Tue Aug 30, 2010  8:13 AM      Regarding: FW: coumadin       Thanks, Verdene Creson. Aram Beecham      ----- Message -----         From: Lewayne Bunting, MD         Sent: 08/29/2010   5:44 PM           To: Linna Hoff, RN      Subject: RE: coumadin                                             Ok to hold coumadin; bridge with lovenox.      Olga Millers            ----- Message -----         From: Linna Hoff, RN         Sent: 08/29/2010   4:41 PM           To: Lewayne Bunting, MD      Subject: coumadin                                                 Dr Jens Som, pt is to have a Colonoscopy on 09/14/10. Pt is on Coumadin managed by the [facility]. Please advise for holding of Coumadin and/or bridging of Lovenox. Thanks.

## 2010-08-30 NOTE — Telephone Encounter (Signed)
Received a note back from Dr Jens Som stating it's ok to stop Coumadin and bridge with Lovenox. Sent note to [employer] at the [facility].

## 2010-09-06 ENCOUNTER — Ambulatory Visit (AMBULATORY_SURGERY_CENTER): Payer: Medicare Other | Admitting: *Deleted

## 2010-09-06 VITALS — Ht 63.0 in | Wt 165.0 lb

## 2010-09-06 DIAGNOSIS — Z1211 Encounter for screening for malignant neoplasm of colon: Secondary | ICD-10-CM

## 2010-09-06 MED ORDER — PEG-KCL-NACL-NASULF-NA ASC-C 100 G PO SOLR: ORAL | Status: DC

## 2010-09-13 ENCOUNTER — Encounter: Payer: Medicare Other | Admitting: *Deleted

## 2010-09-14 ENCOUNTER — Telehealth: Payer: Self-pay | Admitting: Cardiology

## 2010-09-14 ENCOUNTER — Ambulatory Visit (AMBULATORY_SURGERY_CENTER): Payer: Medicare Other | Admitting: Gastroenterology

## 2010-09-14 ENCOUNTER — Encounter: Payer: Self-pay | Admitting: Gastroenterology

## 2010-09-14 VITALS — BP 137/77 | HR 58 | Temp 97.8°F | Resp 18 | Ht 63.0 in | Wt 165.0 lb

## 2010-09-14 DIAGNOSIS — K5730 Diverticulosis of large intestine without perforation or abscess without bleeding: Secondary | ICD-10-CM

## 2010-09-14 DIAGNOSIS — A0472 Enterocolitis due to Clostridium difficile, not specified as recurrent: Secondary | ICD-10-CM

## 2010-09-14 DIAGNOSIS — Z1211 Encounter for screening for malignant neoplasm of colon: Secondary | ICD-10-CM

## 2010-09-14 MED ORDER — SODIUM CHLORIDE 0.9 % IV SOLN: 500.0000 mL | INTRAVENOUS | Status: DC

## 2010-09-14 NOTE — Telephone Encounter (Signed)
Spoke with pt.  Verified Coumadin and Lovenox instructions.  Pt will take Coumadin 5mg  daily starting today and Lovenox once daily starting tomorrow until appt on 8/20

## 2010-09-14 NOTE — Progress Notes (Signed)
Per Dr. Jarold Motto, ok to restart Coumadin  Pt has been on Lovenox, as a bridge for the Coumadin, and writer told pt and family to call the [facility] re: Lovenox.  Understanding voiced

## 2010-09-14 NOTE — Telephone Encounter (Signed)
Pt  has question re going back on coumadin after a procedure

## 2010-09-14 NOTE — Patient Instructions (Signed)
Please review your discharge instructions (blue and green sheets)

## 2010-09-15 ENCOUNTER — Telehealth: Payer: Self-pay | Admitting: *Deleted

## 2010-09-15 NOTE — Telephone Encounter (Signed)

## 2010-09-19 ENCOUNTER — Ambulatory Visit (INDEPENDENT_AMBULATORY_CARE_PROVIDER_SITE_OTHER): Payer: Medicare Other | Admitting: *Deleted

## 2010-09-19 DIAGNOSIS — Z86718 Personal history of other venous thrombosis and embolism: Secondary | ICD-10-CM

## 2010-09-19 DIAGNOSIS — D682 Hereditary deficiency of other clotting factors: Secondary | ICD-10-CM

## 2010-09-19 LAB — POCT INR: INR: 2.1

## 2010-09-26 ENCOUNTER — Other Ambulatory Visit: Payer: Self-pay | Admitting: Dermatology

## 2010-10-10 ENCOUNTER — Ambulatory Visit (INDEPENDENT_AMBULATORY_CARE_PROVIDER_SITE_OTHER): Payer: Medicare Other | Admitting: *Deleted

## 2010-10-10 DIAGNOSIS — D682 Hereditary deficiency of other clotting factors: Secondary | ICD-10-CM

## 2010-10-10 DIAGNOSIS — Z86718 Personal history of other venous thrombosis and embolism: Secondary | ICD-10-CM

## 2010-10-10 LAB — POCT INR: INR: 4

## 2010-10-15 ENCOUNTER — Other Ambulatory Visit: Payer: Self-pay | Admitting: Internal Medicine

## 2010-10-24 ENCOUNTER — Ambulatory Visit (INDEPENDENT_AMBULATORY_CARE_PROVIDER_SITE_OTHER): Payer: Medicare Other | Admitting: *Deleted

## 2010-10-24 DIAGNOSIS — Z86718 Personal history of other venous thrombosis and embolism: Secondary | ICD-10-CM

## 2010-10-24 DIAGNOSIS — D682 Hereditary deficiency of other clotting factors: Secondary | ICD-10-CM

## 2010-10-24 LAB — POCT INR: INR: 2.8

## 2010-11-14 ENCOUNTER — Ambulatory Visit (INDEPENDENT_AMBULATORY_CARE_PROVIDER_SITE_OTHER): Payer: Medicare Other | Admitting: *Deleted

## 2010-11-14 DIAGNOSIS — Z23 Encounter for immunization: Secondary | ICD-10-CM

## 2010-11-14 DIAGNOSIS — Z86718 Personal history of other venous thrombosis and embolism: Secondary | ICD-10-CM

## 2010-11-14 DIAGNOSIS — D682 Hereditary deficiency of other clotting factors: Secondary | ICD-10-CM

## 2010-11-14 LAB — POCT INR: INR: 2.7

## 2010-11-23 ENCOUNTER — Other Ambulatory Visit: Payer: Self-pay | Admitting: *Deleted

## 2010-11-23 MED ORDER — WARFARIN SODIUM 5 MG PO TABS: 5.0000 mg | ORAL_TABLET | Freq: Every day | ORAL | Status: DC

## 2010-12-01 ENCOUNTER — Encounter (HOSPITAL_BASED_OUTPATIENT_CLINIC_OR_DEPARTMENT_OTHER): Payer: Medicare Other | Admitting: Oncology

## 2010-12-01 DIAGNOSIS — Z170 Estrogen receptor positive status [ER+]: Secondary | ICD-10-CM

## 2010-12-01 DIAGNOSIS — C50519 Malignant neoplasm of lower-outer quadrant of unspecified female breast: Secondary | ICD-10-CM

## 2010-12-01 DIAGNOSIS — D6859 Other primary thrombophilia: Secondary | ICD-10-CM

## 2010-12-01 DIAGNOSIS — Z7901 Long term (current) use of anticoagulants: Secondary | ICD-10-CM

## 2010-12-12 ENCOUNTER — Encounter: Payer: Medicare Other | Admitting: *Deleted

## 2010-12-14 ENCOUNTER — Encounter: Payer: Medicare Other | Admitting: *Deleted

## 2010-12-14 ENCOUNTER — Ambulatory Visit (INDEPENDENT_AMBULATORY_CARE_PROVIDER_SITE_OTHER): Payer: Medicare Other | Admitting: *Deleted

## 2010-12-14 DIAGNOSIS — Z86718 Personal history of other venous thrombosis and embolism: Secondary | ICD-10-CM

## 2010-12-14 DIAGNOSIS — D682 Hereditary deficiency of other clotting factors: Secondary | ICD-10-CM

## 2010-12-14 LAB — POCT INR: INR: 2

## 2010-12-19 ENCOUNTER — Other Ambulatory Visit: Payer: Self-pay | Admitting: Internal Medicine

## 2011-01-10 ENCOUNTER — Ambulatory Visit: Payer: Medicare Other | Admitting: Internal Medicine

## 2011-01-11 ENCOUNTER — Ambulatory Visit (INDEPENDENT_AMBULATORY_CARE_PROVIDER_SITE_OTHER): Payer: Medicare Other | Admitting: *Deleted

## 2011-01-11 DIAGNOSIS — D682 Hereditary deficiency of other clotting factors: Secondary | ICD-10-CM

## 2011-01-11 DIAGNOSIS — Z86718 Personal history of other venous thrombosis and embolism: Secondary | ICD-10-CM

## 2011-01-11 LAB — POCT INR: INR: 2.7

## 2011-02-08 ENCOUNTER — Encounter (INDEPENDENT_AMBULATORY_CARE_PROVIDER_SITE_OTHER): Payer: Self-pay | Admitting: General Surgery

## 2011-02-10 ENCOUNTER — Encounter (INDEPENDENT_AMBULATORY_CARE_PROVIDER_SITE_OTHER): Payer: Self-pay | Admitting: Surgery

## 2011-02-10 ENCOUNTER — Ambulatory Visit (INDEPENDENT_AMBULATORY_CARE_PROVIDER_SITE_OTHER): Payer: Medicare Other | Admitting: Surgery

## 2011-02-10 VITALS — BP 142/80 | HR 80 | Temp 98.0°F | Resp 12 | Ht 63.0 in | Wt 168.0 lb

## 2011-02-10 DIAGNOSIS — C50919 Malignant neoplasm of unspecified site of unspecified female breast: Secondary | ICD-10-CM

## 2011-02-10 NOTE — Patient Instructions (Signed)
Continue annual mammograms Recheck here in a year

## 2011-02-10 NOTE — Progress Notes (Signed)
02/10/2011  [name] Little is a 78 y.o.female who presents for routine followup of her Right Breast cancer, lower outer quadtrant, Stage Idiagnosed in July 2010 and treated with Lumpectomy, SLN andRadiation. She has no problems or concerns on either side.  PFSH She has had no significant changes since the last visit here.  ROS There have been no significant changes since the last visit here She is concerned about a small scar around the medial clavicular area on the left. She had a small skin lesion removed and it is still painful  General: The patient is alert, oriented, generally healty appearing, NAD. Mood and affect are normal.  Breasts: There is a loss of tissue on the right breast at the lumpectomy site in the lower outer quadrant however there is no dominant mass or suggestion of new breast cancer. The remainder of the right breast is normal. The left breast is normal. Lymphatics: She has no axillary or supraclavicular adenopathy on either side.  Extremities: Full ROM of the surgical side with no lymphedema noted.  Skin: Healed scar around medial L clavicle that has some nodularity and redness  Data Reviewed: No new data  Impression:Stable exam, no problems noted  Plan: Will see again in one year. Told her to follow up with her dermatologist about the scar, I think may be developing a hypertrophic scar

## 2011-02-13 ENCOUNTER — Telehealth (INDEPENDENT_AMBULATORY_CARE_PROVIDER_SITE_OTHER): Payer: Self-pay | Admitting: General Surgery

## 2011-02-13 DIAGNOSIS — L905 Scar conditions and fibrosis of skin: Secondary | ICD-10-CM

## 2011-02-13 NOTE — Telephone Encounter (Signed)
Patient called re: seeing dermatologist about scar. Patient called and stated she could not get in with Dr Londell Moh or Yetta Barre with [facility] until May 2013. Patient stated she was told she could get in earlier if we called to make referral. [phone].

## 2011-02-14 NOTE — Telephone Encounter (Signed)
I called and no answer at dermatology office. I put in referral and gave to Stanton Kidney, our referral coordinator, to set up.

## 2011-02-22 ENCOUNTER — Ambulatory Visit (INDEPENDENT_AMBULATORY_CARE_PROVIDER_SITE_OTHER): Payer: Medicare Other | Admitting: Pharmacist

## 2011-02-22 DIAGNOSIS — D682 Hereditary deficiency of other clotting factors: Secondary | ICD-10-CM

## 2011-02-22 DIAGNOSIS — Z86718 Personal history of other venous thrombosis and embolism: Secondary | ICD-10-CM

## 2011-02-22 LAB — POCT INR: INR: 2.2

## 2011-02-23 ENCOUNTER — Other Ambulatory Visit: Payer: Self-pay | Admitting: Oncology

## 2011-02-23 DIAGNOSIS — Z853 Personal history of malignant neoplasm of breast: Secondary | ICD-10-CM

## 2011-04-03 ENCOUNTER — Encounter (HOSPITAL_COMMUNITY): Payer: Self-pay

## 2011-04-03 ENCOUNTER — Emergency Department (HOSPITAL_COMMUNITY): Payer: Medicare Other

## 2011-04-03 ENCOUNTER — Emergency Department (HOSPITAL_COMMUNITY)
Admission: EM | Admit: 2011-04-03 | Discharge: 2011-04-03 | Disposition: A | Discharge status: Home / Self Care | Payer: Medicare Other | Attending: Emergency Medicine | Admitting: Emergency Medicine

## 2011-04-03 DIAGNOSIS — M5430 Sciatica, unspecified side: Secondary | ICD-10-CM | POA: Insufficient documentation

## 2011-04-03 DIAGNOSIS — M5106 Intervertebral disc disorders with myelopathy, lumbar region: Secondary | ICD-10-CM | POA: Insufficient documentation

## 2011-04-03 DIAGNOSIS — M5431 Sciatica, right side: Secondary | ICD-10-CM

## 2011-04-03 DIAGNOSIS — M25559 Pain in unspecified hip: Secondary | ICD-10-CM | POA: Insufficient documentation

## 2011-04-03 DIAGNOSIS — I10 Essential (primary) hypertension: Secondary | ICD-10-CM | POA: Insufficient documentation

## 2011-04-03 DIAGNOSIS — Z96649 Presence of unspecified artificial hip joint: Secondary | ICD-10-CM | POA: Insufficient documentation

## 2011-04-03 DIAGNOSIS — Z7901 Long term (current) use of anticoagulants: Secondary | ICD-10-CM | POA: Insufficient documentation

## 2011-04-03 DIAGNOSIS — Z853 Personal history of malignant neoplasm of breast: Secondary | ICD-10-CM | POA: Insufficient documentation

## 2011-04-03 DIAGNOSIS — Z86718 Personal history of other venous thrombosis and embolism: Secondary | ICD-10-CM | POA: Insufficient documentation

## 2011-04-03 LAB — CBC
HCT: 42.3 % (ref 36.0–46.0)
Hemoglobin: 14.3 g/dL (ref 12.0–15.0)
MCH: 30.3 pg (ref 26.0–34.0)
MCHC: 33.8 g/dL (ref 30.0–36.0)
MCV: 89.6 fL (ref 78.0–100.0)
Platelets: 376 10*3/uL (ref 150–400)
RBC: 4.72 MIL/uL (ref 3.87–5.11)
RDW: 13.5 % (ref 11.5–15.5)
WBC: 8.8 10*3/uL (ref 4.0–10.5)

## 2011-04-03 LAB — PROTIME-INR
INR: 1.85 — ABNORMAL HIGH (ref 0.00–1.49)
Prothrombin Time: 21.7 seconds — ABNORMAL HIGH (ref 11.6–15.2)

## 2011-04-03 LAB — APTT: aPTT: 36 seconds (ref 24–37)

## 2011-04-03 LAB — BASIC METABOLIC PANEL
BUN: 22 mg/dL (ref 6–23)
CO2: 26 mEq/L (ref 19–32)
Calcium: 10.1 mg/dL (ref 8.4–10.5)
Chloride: 103 mEq/L (ref 96–112)
Creatinine, Ser: 0.74 mg/dL (ref 0.50–1.10)
GFR calc Af Amer: >90 mL/min (ref >90)
GFR calc non Af Amer: 81 mL/min — ABNORMAL LOW (ref >90)
Glucose, Bld: 98 mg/dL (ref 70–99)
Potassium: 4.1 mEq/L (ref 3.5–5.1)
Sodium: 140 mEq/L (ref 135–145)

## 2011-04-03 MED ORDER — OXYCODONE-ACETAMINOPHEN 5-325 MG PO TABS: 1.0000 | ORAL_TABLET | ORAL | Status: DC | PRN

## 2011-04-03 MED ORDER — HYDROMORPHONE HCL PF 1 MG/ML IJ SOLN
0.5000 mg | Freq: Once | INTRAMUSCULAR | Status: AC
  Administered 2011-04-03: 0.5 mg via INTRAVENOUS
  Filled 2011-04-03: qty 1

## 2011-04-03 MED ORDER — DIAZEPAM 5 MG PO TABS: 5.0000 mg | ORAL_TABLET | Freq: Three times a day (TID) | ORAL | Status: DC | PRN

## 2011-04-03 MED ORDER — MORPHINE SULFATE 4 MG/ML IJ SOLN
4.0000 mg | Freq: Once | INTRAMUSCULAR | Status: AC
  Administered 2011-04-03: 4 mg via INTRAVENOUS
  Filled 2011-04-03: qty 1

## 2011-04-03 MED ORDER — OXYCODONE-ACETAMINOPHEN 5-325 MG PO TABS
1.0000 | ORAL_TABLET | Freq: Once | ORAL | Status: AC
  Administered 2011-04-03: 1 via ORAL
  Filled 2011-04-03: qty 1

## 2011-04-03 MED ORDER — DIAZEPAM 2 MG PO TABS
2.0000 mg | ORAL_TABLET | Freq: Once | ORAL | Status: AC
  Administered 2011-04-03: 2 mg via ORAL
  Filled 2011-04-03: qty 1

## 2011-04-03 NOTE — ED Notes (Signed)
ZOX:[id number]<AV> Expected date:<BR> Expected time:<BR> Means of arrival:<BR> Comments:<BR> Hip pain possible rotation

## 2011-04-03 NOTE — ED Notes (Signed)
Pt brought by ems from home with c/o right hip pain, denies fall. Pt states hip replacement in 1999 and had no problems but this morning pt made awkward move and felt a pop.

## 2011-04-03 NOTE — Discharge Instructions (Signed)

## 2011-04-03 NOTE — ED Provider Notes (Addendum)
History     CSN: [id number]  Arrival date & time 04/03/11  1020   First MD Initiated Contact with Patient 04/03/11 1030      Chief Complaint  Patient presents with  . Hip Pain    right    (Consider location/radiation/quality/duration/timing/severity/associated sxs/prior treatment) HPI Comments: Patient has a known history of a right hip replacement in 1999.  Patient states that over the last few day she's had some small "twinges" of pain in her right hip.  She has not any falls or injuries.  She notes that today she was walking around her house and had a very sharp pain in her right hip that radiated down her right thigh to her right calf.  It is worse with attempting to bear weight on that leg and she is unable to bear weight at this time.  She did not have a fall or any other twisting injuries per her report.  She denies any fevers.  She denies any back pain, new lifting or other trauma.  Patient denies any numbness in that leg.  Patient is a 78 y.o. female presenting with hip pain. The history is provided by the patient. No language interpreter was used.  Hip Pain This is a new problem. The current episode started yesterday. The problem occurs constantly. Pertinent negatives include no chest pain, no abdominal pain, no headaches and no shortness of breath. The symptoms are aggravated by walking. The symptoms are relieved by rest and position. She has tried nothing for the symptoms.    Past Medical History  Diagnosis Date  . Personal history of colonic polyps     hyperplastic  . Hemorrhoids   . Thoracic aortic aneurysm   . Paroxysmal supraventricular tachycardia   . Factor V deficiency   . TMJ pain dysfunction syndrome   . DVT (deep venous thrombosis)   . Hyperlipemia   . Blood clotting disorder     V5 factor  . Clostridium difficile infection   . Hypertension   . Arthritis   . Infiltrating ductal carcinoma of breast     stage one right     Past Surgical History    Procedure Date  . Total hip arthroplasty 1999    right  . Laparoscopic hysterectomy   . Splenectomy   . Breast lumpectomy 2001    right Dr. Jamey Ripa  . Right calf surgery     for cancer  . Colonoscopy   . Upper gastrointestinal endoscopy     Family History  Problem Relation Age of Onset  . Heart disease Mother   . Heart disease Father   . Heart disease      uncles  . Clotting disorder Maternal Uncle   . Breast cancer Paternal Aunt   . Cancer Paternal Aunt     breast  . Colon cancer Neg Hx     History  Substance Use Topics  . Smoking status: Never Smoker   . Smokeless tobacco: Never Used  . Alcohol Use: No    OB History    Grav Para Term Preterm Abortions TAB SAB Ect Mult Living                  Review of Systems  Constitutional: Negative.  Negative for fever and chills.  HENT: Negative.   Eyes: Negative.  Negative for discharge and redness.  Respiratory: Negative.  Negative for cough and shortness of breath.   Cardiovascular: Negative.  Negative for chest pain.  Gastrointestinal: Negative.  Negative  for nausea, vomiting, abdominal pain and diarrhea.  Genitourinary: Negative.  Negative for dysuria and vaginal discharge.  Musculoskeletal: Negative for back pain.  Skin: Negative.  Negative for color change and rash.  Neurological: Negative.  Negative for syncope and headaches.  Hematological: Negative.  Negative for adenopathy.  Psychiatric/Behavioral: Negative.  Negative for confusion.  All other systems reviewed and are negative.    Allergies  Cefuroxime axetil and Sulfonamide derivatives  Home Medications   Current Outpatient Rx  Name Route Sig Dispense Refill  . CALCIUM CARBONATE-VITAMIN D 600-400 MG-UNIT PO TABS Oral Take 1 tablet by mouth daily.      Marland Kitchen FA-CYANOCOBALAMIN-PYRIDOXINE 2.05-31-23 MG PO TABS Oral Take 1 tablet by mouth daily.      Marland Kitchen HYDROCHLOROTHIAZIDE PO  Take one 12.5mg  tab by mouth daily     . LISINOPRIL 10 MG PO TABS  TAKE 1 TABLET BY  MOUTH DAILY 30 tablet 3  . MAGNESIUM OXIDE 250 MG PO TABS Oral Take 1 tablet by mouth daily.      Marland Kitchen NIASPAN 500 MG PO TBCR  TAKE 3 TABLETS BY MOUTH AT BEDTIME 90 tablet 4  . FISH OIL 1000 MG PO CPDR Oral Take 1 capsule by mouth daily.      Marland Kitchen POTASSIUM CHLORIDE CRYS ER 20 MEQ PO TBCR Oral Take 20 mEq by mouth daily.      . WARFARIN SODIUM 5 MG PO TABS Oral Take 1 tablet (5 mg total) by mouth daily. 35 tablet 3    BP 193/77  Pulse 64  Temp(Src) 98.1 F (36.7 C) (Oral)  Resp 16  Ht 5\' 3"  (1.6 m)  Wt 166 lb (75.297 kg)  BMI 29.41 kg/m2  SpO2 96%  Physical Exam  Nursing note and vitals reviewed. Constitutional: She is oriented to person, place, and time. She appears well-developed and well-nourished.  Non-toxic appearance. She does not have a sickly appearance.  HENT:  Head: Normocephalic and atraumatic.  Eyes: Conjunctivae, EOM and lids are normal. Pupils are equal, round, and reactive to light. No scleral icterus.  Neck: Trachea normal and normal range of motion. Neck supple.  Cardiovascular: Normal rate, regular rhythm and normal heart sounds.   Pulmonary/Chest: Effort normal and breath sounds normal. No respiratory distress. She has no wheezes. She has no rales.  Abdominal: Soft. Normal appearance. There is no tenderness. There is no rebound, no guarding and no CVA tenderness.  Musculoskeletal: She exhibits no edema.       Patient has mild tenderness to palpation over her right lateral hip.  No redness or swelling is noted on exam.  No specific deformity is noted on exam.  She has sensation to light touch intact in her right leg.  She does have pain with some passive motion with internal and external rotation of the hip.  With straight leg raise of the hip she has pain that radiates down her leg.  Neurological: She is alert and oriented to person, place, and time. She has normal strength.  Skin: Skin is warm, dry and intact. No rash noted.  Psychiatric: She has a normal mood and  affect. Her behavior is normal. Judgment and thought content normal.    ED Course  Procedures (including critical care time)  Results for orders placed during the hospital encounter of 04/03/11  CBC      Component Value Range   WBC 8.8  4.0 - 10.5 (K/uL)   RBC 4.72  3.87 - 5.11 (MIL/uL)   Hemoglobin 14.3  12.0 -  15.0 (g/dL)   HCT 16.1  09.6 - 04.5 (%)   MCV 89.6  78.0 - 100.0 (fL)   MCH 30.3  26.0 - 34.0 (pg)   MCHC 33.8  30.0 - 36.0 (g/dL)   RDW 40.9  81.1 - 91.4 (%)   Platelets 376  150 - 400 (K/uL)  BASIC METABOLIC PANEL      Component Value Range   Sodium 140  135 - 145 (mEq/L)   Potassium 4.1  3.5 - 5.1 (mEq/L)   Chloride 103  96 - 112 (mEq/L)   CO2 26  19 - 32 (mEq/L)   Glucose, Bld 98  70 - 99 (mg/dL)   BUN 22  6 - 23 (mg/dL)   Creatinine, Ser 7.82  0.50 - 1.10 (mg/dL)   Calcium 95.6  8.4 - 10.5 (mg/dL)   GFR calc non Af Amer 81 (*) >90 (mL/min)   GFR calc Af Amer >90  >90 (mL/min)  PROTIME-INR      Component Value Range   Prothrombin Time 21.7 (*) 11.6 - 15.2 (seconds)   INR 1.85 (*) 0.00 - 1.49   APTT      Component Value Range   aPTT 36  24 - 37 (seconds)   Dg Lumbar Spine 2-3 Views  04/03/2011  *RADIOLOGY REPORT*  Clinical Data: Severe right hip pain.  LUMBAR SPINE - 2-3 VIEW  Comparison: None.  Findings: Alignment is anatomic.  Vertebral body height is maintained.  Mild endplate degenerative changes, loss of disc space height and facet hypertrophy at L5-S1.  Right hip arthroplasty is partially imaged.  IMPRESSION: L5-S1 spondylosis.  Original Report Authenticated By: Reyes Ivan, M.D.   Dg Hip Complete Right  04/03/2011  *RADIOLOGY REPORT*  Clinical Data: Severe right hip pain.  RIGHT HIP - COMPLETE 2+ VIEW  Comparison: None.  Findings: Right hip arthroplasty.  No complicating features.  No fracture or dislocation.  IMPRESSION: Right hip arthroplasty.  No acute findings.  Original Report Authenticated By: Reyes Ivan, M.D.   Mr Lumbar Spine Wo  Contrast  04/03/2011  *RADIOLOGY REPORT*  Clinical Data: Right hip pain radiating down the right leg.  MRI LUMBAR SPINE WITHOUT CONTRAST  Technique:  Multiplanar and multiecho pulse sequences of the lumbar spine were obtained without intravenous contrast.  Comparison: Radiography same day  Findings: Incidental note is made of layering gallstones within the gallbladder.  There is no significant finding at L3-4 or above.  The discs are unremarkable.  The canal and foramina are widely patent.  The distal cord and conus are normal.  At L4-5, there is facet degeneration and mild ligamentous hypertrophy.  There is 1 mm of anterolisthesis.  The disc bulges mildly but there is no apparent compressive stenosis.  At L5-S1, there is a right posterolateral disc herniation with a free fragment in the right lateral recess that compresses the right S1 nerve root.  There is mild facet degeneration at that level.  IMPRESSION: Right posterolateral disc herniation at L5-S1 with a fragment in the right lateral recess compressing the right S1 nerve root.  Original Report Authenticated By: Thomasenia Sales, M.D.      MDM  Patient with symptoms concerning for possible sciatica type pain given that she had no falls and has a normal hip x-ray at this time.  Patient notes some improvement in her pain with the morphine but still is unable to move her leg significantly in the bed.  I will give her further doses of pain medication  and will obtain an MRI of her lumbar spine to further assess for possible disc herniation causing radiculopathy as a cause for the patient's pain radiating down her leg.  If we're unable to further control the patient's pain which is able to ambulate she may require admission for further pain control and evaluation.        Nat Christen, MD 04/03/11 1214  Patient has much better pain control after her MRI and the additional pain medications.  Patient has been able to ambulate with minimal assistance  here in the emergency department.  Patient's pain appears to be a sciatica type pain given that she has compression on the S1 nerve root.  This correlates with the patient's location of her pain in her posterior thigh and lateral lower leg.  Patient has a walker at home that she can use to assist her with ambulation.  I will give her followup with the neurosurgeon spine specialist and pain medications and muscle relaxants for use at home.  I counseled the patient regarding use of stool softeners as well to avoid constipation.   Nat Christen, MD 04/03/11 [id number]

## 2011-04-06 ENCOUNTER — Encounter: Payer: Self-pay | Admitting: Cardiology

## 2011-04-06 ENCOUNTER — Ambulatory Visit (INDEPENDENT_AMBULATORY_CARE_PROVIDER_SITE_OTHER): Payer: Medicare Other | Admitting: *Deleted

## 2011-04-06 ENCOUNTER — Ambulatory Visit (INDEPENDENT_AMBULATORY_CARE_PROVIDER_SITE_OTHER): Payer: Medicare Other | Admitting: Cardiology

## 2011-04-06 ENCOUNTER — Other Ambulatory Visit: Payer: Self-pay | Admitting: Neurological Surgery

## 2011-04-06 VITALS — BP 135/67 | HR 74 | Wt 175.0 lb

## 2011-04-06 DIAGNOSIS — Z86718 Personal history of other venous thrombosis and embolism: Secondary | ICD-10-CM

## 2011-04-06 DIAGNOSIS — Z01810 Encounter for preprocedural cardiovascular examination: Secondary | ICD-10-CM | POA: Insufficient documentation

## 2011-04-06 DIAGNOSIS — D682 Hereditary deficiency of other clotting factors: Secondary | ICD-10-CM

## 2011-04-06 DIAGNOSIS — E785 Hyperlipidemia, unspecified: Secondary | ICD-10-CM

## 2011-04-06 DIAGNOSIS — I712 Thoracic aortic aneurysm, without rupture: Secondary | ICD-10-CM

## 2011-04-06 DIAGNOSIS — I10 Essential (primary) hypertension: Secondary | ICD-10-CM

## 2011-04-06 LAB — POCT INR: INR: 2.3

## 2011-04-06 MED ORDER — ENOXAPARIN SODIUM 80 MG/0.8ML ~~LOC~~ SOLN: 80.0000 mg | Freq: Two times a day (BID) | SUBCUTANEOUS | Status: DC

## 2011-04-06 NOTE — Patient Instructions (Signed)
Your physician recommends that you schedule a follow-up appointment in: AS NEEDED  

## 2011-04-06 NOTE — Assessment & Plan Note (Signed)
Blood pressure controlled. Continue present medications. 

## 2011-04-06 NOTE — Assessment & Plan Note (Signed)
Continue Coumadin. She will need a Lovenox bridge when she comes off of her Coumadin for her back surgery. We will relay this message to Coumadin clinic. Note she is followed by Dr. Debby Bud for this issue.

## 2011-04-06 NOTE — Progress Notes (Signed)
HPI: Ms. Olheiser is a pleasant  female with past medical history of mildly dilated thoracic aorta, hypertension, factor V Leiden for fu. A nuclear study was performed in March 2010 and showed an ejection fraction of 77% and normal perfusion. Last MRA of thoracic aorta in August of 2011 showed ascending thoracic aorta of 3.5-3.6 cm. Previous CT measurement of 4 cm felt to be overestimated. Since she was seen in July of 2011, she denies dyspnea, chest pain, palpitations or syncope. She is scheduled for back surgery and we were asked to evaluate preoperatively.  Current Outpatient Prescriptions  Medication Sig Dispense Refill  . Calcium Carbonate-Vitamin D (CALTRATE 600+D) 600-400 MG-UNIT per tablet Take 1 tablet by mouth daily.        . diazepam (VALIUM) 5 MG tablet Take 1 tablet (5 mg total) by mouth every 8 (eight) hours as needed for anxiety.  20 tablet  0  . Folic Acid-Vit B6-Vit B12 (FA-CYANOCOBALAMIN-PYRIDOXINE) 2.05-31-23 MG TABS Take 1 tablet by mouth daily.        Marland Kitchen HYDROCHLOROTHIAZIDE PO Take one 12.5mg  tab by mouth daily      . lisinopril (PRINIVIL,ZESTRIL) 10 MG tablet TAKE 1 TABLET BY MOUTH DAILY  30 tablet  3  . Omega-3 Fatty Acids (FISH OIL) 1000 MG CPDR Take 1 capsule by mouth daily.        Marland Kitchen oxyCODONE-acetaminophen (ROXICET) 5-325 MG per tablet Take 1 tablet by mouth every 4 (four) hours as needed for pain.  30 tablet  0  . warfarin (COUMADIN) 5 MG tablet Take 5 mg by mouth daily. Take 2.5mg  every Monday Wednesday and Friday Takes 5mg  all other days         Past Medical History  Diagnosis Date  . Personal history of colonic polyps     hyperplastic  . Hemorrhoids   . Thoracic aortic aneurysm   . Paroxysmal supraventricular tachycardia   . Factor V deficiency   . TMJ pain dysfunction syndrome   . DVT (deep venous thrombosis)   . Hyperlipemia   . Blood clotting disorder     V5 factor  . Clostridium difficile infection   . Hypertension   . Arthritis   . 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 Total hip arthroplasty 1999    right  . Laparoscopic hysterectomy   . Splenectomy   . Breast lumpectomy 2001    right Dr. Jamey Ripa  . Right calf surgery     for cancer  . Colonoscopy   . Upper gastrointestinal endoscopy     History   Social History  . Marital Status: Married    Spouse Name: N/A    Number of Children: 4  . Years of Education: N/A   Occupational History  . Retired    Social History Main Topics  . Smoking status: Never Smoker   . Smokeless tobacco: Never Used  . Alcohol Use: No  . Drug Use: No  . Sexually Active: Not on file   Other Topics Concern  . Not on file   Social History Narrative   Married [id number] sons - '59, '61, 2 daughters '55, '57; 8 grandchildren; 1 great grandI-ADLs    ROS: Problems with back pain but no fevers or chills, productive cough, hemoptysis, dysphasia, odynophagia, melena, hematochezia, dysuria, hematuria, rash, seizure activity, orthopnea, PND, pedal edema, claudication. Remaining systems are negative.  Physical Exam: Well-developed well-nourished in  no acute distress.  Skin is warm and dry.  HEENT is normal.  Neck is supple. No thyromegaly.  Chest is clear to auscultation with normal expansion.  Cardiovascular exam is regular rate and rhythm.  Abdominal exam nontender or distended. No masses palpated. Extremities show no edema. neuro grossly intact  ECG sinus rhythm at a rate of 74. Left anterior fascicular block. Left ventricular hypertrophy.

## 2011-04-06 NOTE — Assessment & Plan Note (Signed)
Patient has had no chest pain or shortness of breath. Myoview in 2010 negative. No plans for further ischemia evaluation.

## 2011-04-06 NOTE — Assessment & Plan Note (Signed)
Management per primary care. 

## 2011-04-06 NOTE — Patient Instructions (Addendum)
Day of surgery 04/11/2011. Provided lovenox instruction sheet, reviewed lovenox injection instruction, pt has administered herself before. 04/06/11--no coumadin or lovenox 04/07/11--lovenox 80 mg morning and night 04/08/11--lovenox 80 mg morning and night 04/09/11--lovenox 80 mg morning and night 04/10/11--lovenox 80 mg morning injection only 04/11/11--no lovenox Follow discharge instructions

## 2011-04-06 NOTE — Assessment & Plan Note (Signed)
Followup MRA showed no dilatation.

## 2011-04-07 ENCOUNTER — Encounter (HOSPITAL_COMMUNITY): Payer: Self-pay | Admitting: Pharmacy Technician

## 2011-04-10 ENCOUNTER — Encounter (HOSPITAL_COMMUNITY): Payer: Self-pay

## 2011-04-10 ENCOUNTER — Encounter (HOSPITAL_COMMUNITY)
Admission: RE | Admit: 2011-04-10 | Discharge: 2011-04-10 | Disposition: A | Discharge status: Home / Self Care | Payer: Medicare Other | Source: Ambulatory Visit | Attending: Anesthesiology | Admitting: Anesthesiology

## 2011-04-10 ENCOUNTER — Encounter (HOSPITAL_COMMUNITY)
Admission: RE | Admit: 2011-04-10 | Discharge: 2011-04-10 | Disposition: A | Discharge status: Home / Self Care | Payer: Medicare Other | Source: Ambulatory Visit | Attending: Neurological Surgery | Admitting: Neurological Surgery

## 2011-04-10 ENCOUNTER — Telehealth: Payer: Self-pay | Admitting: *Deleted

## 2011-04-10 HISTORY — DX: Disease of blood and blood-forming organs, unspecified: D75.9

## 2011-04-10 LAB — SURGICAL PCR SCREEN
MRSA, PCR: NEGATIVE
Staphylococcus aureus: NEGATIVE

## 2011-04-10 LAB — CBC
HCT: 43 % (ref 36.0–46.0)
Hemoglobin: 14.3 g/dL (ref 12.0–15.0)
MCH: 30.5 pg (ref 26.0–34.0)
MCHC: 33.3 g/dL (ref 30.0–36.0)
MCV: 91.7 fL (ref 78.0–100.0)
Platelets: 371 10*3/uL (ref 150–400)
RBC: 4.69 MIL/uL (ref 3.87–5.11)
RDW: 14.1 % (ref 11.5–15.5)
WBC: 8.4 10*3/uL (ref 4.0–10.5)

## 2011-04-10 LAB — BASIC METABOLIC PANEL
BUN: 20 mg/dL (ref 6–23)
CO2: 31 mEq/L (ref 19–32)
Calcium: 10.3 mg/dL (ref 8.4–10.5)
Chloride: 102 mEq/L (ref 96–112)
Creatinine, Ser: 0.74 mg/dL (ref 0.50–1.10)
GFR calc Af Amer: >90 mL/min (ref >90)
GFR calc non Af Amer: 81 mL/min — ABNORMAL LOW (ref >90)
Glucose, Bld: 120 mg/dL — ABNORMAL HIGH (ref 70–99)
Potassium: 4.4 mEq/L (ref 3.5–5.1)
Sodium: 142 mEq/L (ref 135–145)

## 2011-04-10 LAB — PROTIME-INR
INR: 1.16 (ref 0.00–1.49)
Prothrombin Time: 15 seconds (ref 11.6–15.2)

## 2011-04-10 LAB — APTT: aPTT: 36 seconds (ref 24–37)

## 2011-04-10 MED ORDER — VANCOMYCIN HCL IN DEXTROSE 1-5 GM/200ML-% IV SOLN
1000.0000 mg | INTRAVENOUS | Status: AC
  Administered 2011-04-11: 1000 mg via INTRAVENOUS
  Filled 2011-04-10: qty 200

## 2011-04-10 NOTE — Progress Notes (Signed)
Call to MR at the [facility], requested last office visit .

## 2011-04-10 NOTE — Pre-Procedure Instructions (Signed)
20 [name] Little  04/10/2011   Your procedure is scheduled on:  04/11/2011  Report to [facility] at 9:15 AM.  Call this number if you have problems the morning of surgery: [phone]   Remember:   Do not eat food:After Midnight.  May have clear liquids: up to 4 Hours before arrival.  Clear liquids include soda, tea, black coffee, apple or grape juice, broth.  Take these medicines the morning of surgery with A SIP OF WATER: pain medicine is ok if needed    Do not wear jewelry, make-up or nail polish.  Do not wear lotions, powders, or perfumes. You may wear deodorant.  Do not shave 48 hours prior to surgery.  Do not bring valuables to the hospital.  Contacts, dentures or bridgework may not be worn into surgery.  Leave suitcase in the car. After surgery it may be brought to your room.  For patients admitted to the hospital, checkout time is 11:00 AM the day of discharge.   Patients discharged the day of surgery will not be allowed to drive home.  Name and phone number of your driver: with spouse  Special Instructions: CHG Shower Use Special Wash: 1/2 bottle night before surgery and 1/2 bottle morning of surgery.   Please read over the following fact sheets that you were given: Pain Booklet, Coughing and Deep Breathing, MRSA Information and Surgical Site Infection Prevention

## 2011-04-10 NOTE — Progress Notes (Signed)
Call to nurse for Dr. Earlyne Iba last OV note.

## 2011-04-10 NOTE — Telephone Encounter (Signed)
Message received from beth at short stay stateing need for md summary note to have in pt's chart regarding pt's " clotting disorder ". Pt is scheduled for back surgery later this month.  This RN noted last dictation was prior to EPIC. Last office visit dictation obtained that noted pt's is on coumadin which is monitored by her cardiologist.  Pt is seen her for history of breast cancer.  Above dictation faxed to [facility] with noted as above.

## 2011-04-11 ENCOUNTER — Ambulatory Visit (HOSPITAL_COMMUNITY): Payer: Medicare Other | Admitting: Certified Registered Nurse Anesthetist

## 2011-04-11 ENCOUNTER — Inpatient Hospital Stay (HOSPITAL_COMMUNITY)
Admission: RE | Admit: 2011-04-11 | Discharge: 2011-04-12 | DRG: 490 | Disposition: A | Discharge status: Home / Self Care | Payer: Medicare Other | Source: Ambulatory Visit | Attending: Neurological Surgery | Admitting: Neurological Surgery

## 2011-04-11 ENCOUNTER — Encounter (HOSPITAL_COMMUNITY): Payer: Self-pay | Admitting: Certified Registered Nurse Anesthetist

## 2011-04-11 ENCOUNTER — Encounter (HOSPITAL_COMMUNITY): Payer: Self-pay | Admitting: *Deleted

## 2011-04-11 ENCOUNTER — Ambulatory Visit (HOSPITAL_COMMUNITY): Payer: Medicare Other

## 2011-04-11 ENCOUNTER — Encounter (HOSPITAL_COMMUNITY)
Admission: RE | Disposition: A | Discharge status: Home / Self Care | Payer: Self-pay | Source: Ambulatory Visit | Attending: Neurological Surgery

## 2011-04-11 DIAGNOSIS — Z823 Family history of stroke: Secondary | ICD-10-CM

## 2011-04-11 DIAGNOSIS — Z9089 Acquired absence of other organs: Secondary | ICD-10-CM

## 2011-04-11 DIAGNOSIS — Z86718 Personal history of other venous thrombosis and embolism: Secondary | ICD-10-CM

## 2011-04-11 DIAGNOSIS — M5126 Other intervertebral disc displacement, lumbar region: Principal | ICD-10-CM | POA: Diagnosis present

## 2011-04-11 DIAGNOSIS — Z8679 Personal history of other diseases of the circulatory system: Secondary | ICD-10-CM

## 2011-04-11 DIAGNOSIS — Z96649 Presence of unspecified artificial hip joint: Secondary | ICD-10-CM

## 2011-04-11 DIAGNOSIS — Z8249 Family history of ischemic heart disease and other diseases of the circulatory system: Secondary | ICD-10-CM

## 2011-04-11 DIAGNOSIS — Z90710 Acquired absence of both cervix and uterus: Secondary | ICD-10-CM

## 2011-04-11 DIAGNOSIS — D682 Hereditary deficiency of other clotting factors: Secondary | ICD-10-CM | POA: Diagnosis present

## 2011-04-11 DIAGNOSIS — Z882 Allergy status to sulfonamides status: Secondary | ICD-10-CM

## 2011-04-11 DIAGNOSIS — Z888 Allergy status to other drugs, medicaments and biological substances status: Secondary | ICD-10-CM

## 2011-04-11 DIAGNOSIS — Z7901 Long term (current) use of anticoagulants: Secondary | ICD-10-CM

## 2011-04-11 DIAGNOSIS — Z860100 Personal history of colon polyps, unspecified: Secondary | ICD-10-CM

## 2011-04-11 DIAGNOSIS — M5127 Other intervertebral disc displacement, lumbosacral region: Secondary | ICD-10-CM

## 2011-04-11 DIAGNOSIS — Z853 Personal history of malignant neoplasm of breast: Secondary | ICD-10-CM

## 2011-04-11 HISTORY — PX: LUMBAR LAMINECTOMY/DECOMPRESSION MICRODISCECTOMY: SHX5026

## 2011-04-11 SURGERY — LUMBAR LAMINECTOMY/DECOMPRESSION MICRODISCECTOMY
Anesthesia: General | Site: Back | Wound class: Clean

## 2011-04-11 MED ORDER — ONDANSETRON HCL 4 MG/2ML IJ SOLN
INTRAMUSCULAR | Status: DC | PRN
  Administered 2011-04-11: 4 mg via INTRAVENOUS

## 2011-04-11 MED ORDER — SODIUM CHLORIDE 0.9 % IR SOLN
Status: DC | PRN
  Administered 2011-04-11: 12:00:00

## 2011-04-11 MED ORDER — PHENOL 1.4 % MT LIQD: 1.0000 | OROMUCOSAL | Status: DC | PRN

## 2011-04-11 MED ORDER — THROMBIN 5000 UNITS EX SOLR
CUTANEOUS | Status: DC | PRN
  Administered 2011-04-11: 5000 [IU] via TOPICAL

## 2011-04-11 MED ORDER — GLYCOPYRROLATE 0.2 MG/ML IJ SOLN
INTRAMUSCULAR | Status: DC | PRN
  Administered 2011-04-11: .5 mg via INTRAVENOUS

## 2011-04-11 MED ORDER — MORPHINE SULFATE 4 MG/ML IJ SOLN: 1.0000 mg | INTRAMUSCULAR | Status: DC | PRN

## 2011-04-11 MED ORDER — ACETAMINOPHEN 325 MG PO TABS: 650.0000 mg | ORAL_TABLET | ORAL | Status: DC | PRN

## 2011-04-11 MED ORDER — SODIUM CHLORIDE 0.9 % IJ SOLN
3.0000 mL | Freq: Two times a day (BID) | INTRAMUSCULAR | Status: DC
  Administered 2011-04-11 (×2): 3 mL via INTRAVENOUS

## 2011-04-11 MED ORDER — KETOROLAC TROMETHAMINE 15 MG/ML IJ SOLN
15.0000 mg | Freq: Four times a day (QID) | INTRAMUSCULAR | Status: DC
  Administered 2011-04-11 – 2011-04-12 (×3): 15 mg via INTRAVENOUS
  Filled 2011-04-11 (×5): qty 1

## 2011-04-11 MED ORDER — MENTHOL 3 MG MT LOZG: 1.0000 | LOZENGE | OROMUCOSAL | Status: DC | PRN

## 2011-04-11 MED ORDER — FENTANYL CITRATE 0.05 MG/ML IJ SOLN
INTRAMUSCULAR | Status: DC | PRN
Start: 2011-04-11 — End: 2011-04-11
  Administered 2011-04-11: 150 ug via INTRAVENOUS
  Administered 2011-04-11: 50 ug via INTRAVENOUS

## 2011-04-11 MED ORDER — ONDANSETRON HCL 4 MG/2ML IJ SOLN: 4.0000 mg | Freq: Once | INTRAMUSCULAR | Status: DC | PRN

## 2011-04-11 MED ORDER — SODIUM CHLORIDE 0.9 % IV SOLN: 250.0000 mL | INTRAVENOUS | Status: DC

## 2011-04-11 MED ORDER — LISINOPRIL 10 MG PO TABS
10.0000 mg | ORAL_TABLET | Freq: Every day | ORAL | Status: DC
Start: 2011-04-12 — End: 2011-04-12
  Filled 2011-04-11: qty 1

## 2011-04-11 MED ORDER — SENNA 8.6 MG PO TABS
1.0000 | ORAL_TABLET | Freq: Two times a day (BID) | ORAL | Status: DC
  Administered 2011-04-11: 8.6 mg via ORAL
  Filled 2011-04-11 (×3): qty 1

## 2011-04-11 MED ORDER — NEOSTIGMINE METHYLSULFATE 1 MG/ML IJ SOLN
INTRAMUSCULAR | Status: DC | PRN
  Administered 2011-04-11: 3 mg via INTRAVENOUS

## 2011-04-11 MED ORDER — LIDOCAINE-EPINEPHRINE 1 %-1:100000 IJ SOLN
INTRAMUSCULAR | Status: DC | PRN
  Administered 2011-04-11: 10 mL

## 2011-04-11 MED ORDER — SODIUM CHLORIDE 0.9 % IV SOLN
INTRAVENOUS | Status: AC
  Filled 2011-04-11: qty 500

## 2011-04-11 MED ORDER — HEMOSTATIC AGENTS (NO CHARGE) OPTIME
TOPICAL | Status: DC | PRN
  Administered 2011-04-11: 1 via TOPICAL

## 2011-04-11 MED ORDER — ONDANSETRON HCL 4 MG/2ML IJ SOLN: 4.0000 mg | INTRAMUSCULAR | Status: DC | PRN

## 2011-04-11 MED ORDER — ALUM & MAG HYDROXIDE-SIMETH 200-200-20 MG/5ML PO SUSP: 30.0000 mL | Freq: Four times a day (QID) | ORAL | Status: DC | PRN

## 2011-04-11 MED ORDER — HYDROCHLOROTHIAZIDE 12.5 MG PO CAPS
12.5000 mg | ORAL_CAPSULE | Freq: Every day | ORAL | Status: DC
  Filled 2011-04-11: qty 1

## 2011-04-11 MED ORDER — PROPRANOLOL HCL 1 MG/ML IV SOLN
INTRAVENOUS | Status: DC | PRN
  Administered 2011-04-11 (×2): .25 mg via INTRAVENOUS

## 2011-04-11 MED ORDER — HYDROMORPHONE HCL PF 1 MG/ML IJ SOLN
0.2500 mg | INTRAMUSCULAR | Status: DC | PRN
  Administered 2011-04-11: 0.5 mg via INTRAVENOUS

## 2011-04-11 MED ORDER — LIDOCAINE HCL (CARDIAC) 20 MG/ML IV SOLN
INTRAVENOUS | Status: DC | PRN
  Administered 2011-04-11: 60 mg via INTRAVENOUS

## 2011-04-11 MED ORDER — SODIUM CHLORIDE 0.9 % IJ SOLN: 3.0000 mL | INTRAMUSCULAR | Status: DC | PRN

## 2011-04-11 MED ORDER — DIAZEPAM 5 MG PO TABS
5.0000 mg | ORAL_TABLET | Freq: Three times a day (TID) | ORAL | Status: DC | PRN
  Administered 2011-04-11: 5 mg via ORAL
  Filled 2011-04-11: qty 1

## 2011-04-11 MED ORDER — PROPOFOL 10 MG/ML IV EMUL
INTRAVENOUS | Status: DC | PRN
  Administered 2011-04-11: 200 mg via INTRAVENOUS

## 2011-04-11 MED ORDER — BACITRACIN 50000 UNITS IM SOLR
INTRAMUSCULAR | Status: AC
  Filled 2011-04-11: qty 1

## 2011-04-11 MED ORDER — ROCURONIUM BROMIDE 100 MG/10ML IV SOLN
INTRAVENOUS | Status: DC | PRN
  Administered 2011-04-11: 35 mg via INTRAVENOUS

## 2011-04-11 MED ORDER — LACTATED RINGERS IV SOLN
INTRAVENOUS | Status: DC | PRN
  Administered 2011-04-11 (×2): via INTRAVENOUS

## 2011-04-11 MED ORDER — ACETAMINOPHEN 650 MG RE SUPP: 650.0000 mg | RECTAL | Status: DC | PRN

## 2011-04-11 MED ORDER — HYDROMORPHONE HCL PF 1 MG/ML IJ SOLN
INTRAMUSCULAR | Status: AC
  Filled 2011-04-11: qty 1

## 2011-04-11 MED ORDER — BUPIVACAINE HCL (PF) 0.5 % IJ SOLN
INTRAMUSCULAR | Status: DC | PRN
  Administered 2011-04-11 (×2): 10 mL

## 2011-04-11 MED ORDER — OXYCODONE-ACETAMINOPHEN 5-325 MG PO TABS
1.0000 | ORAL_TABLET | ORAL | Status: DC | PRN
  Administered 2011-04-11: 1 via ORAL
  Administered 2011-04-12: 2 via ORAL
  Filled 2011-04-11: qty 1
  Filled 2011-04-11: qty 2

## 2011-04-11 MED ORDER — 0.9 % SODIUM CHLORIDE (POUR BTL) OPTIME
TOPICAL | Status: DC | PRN
  Administered 2011-04-11: 1000 mL

## 2011-04-11 SURGICAL SUPPLY — 52 items
ADH SKN CLS APL DERMABOND .7 (GAUZE/BANDAGES/DRESSINGS) ×1
BAG DECANTER FOR FLEXI CONT (MISCELLANEOUS) ×2 IMPLANT
BLADE SURG ROTATE 9660 (MISCELLANEOUS) IMPLANT
BUR ACORN 6.0 (BURR) IMPLANT
BUR MATCHSTICK NEURO 3.0 LAGG (BURR) ×2 IMPLANT
CANISTER SUCTION 2500CC (MISCELLANEOUS) ×2 IMPLANT
CLOTH BEACON ORANGE TIMEOUT ST (SAFETY) ×2 IMPLANT
CONT SPEC 4OZ CLIKSEAL STRL BL (MISCELLANEOUS) ×2 IMPLANT
DECANTER SPIKE VIAL GLASS SM (MISCELLANEOUS) ×2 IMPLANT
DERMABOND ADVANCED (GAUZE/BANDAGES/DRESSINGS) ×1
DERMABOND ADVANCED .7 DNX12 (GAUZE/BANDAGES/DRESSINGS) ×1 IMPLANT
DRAPE LAPAROTOMY 100X72X124 (DRAPES) ×2 IMPLANT
DRAPE MICROSCOPE ZEISS OPMI (DRAPES) ×1 IMPLANT
DRAPE POUCH INSTRU U-SHP 10X18 (DRAPES) ×2 IMPLANT
DRAPE PROXIMA HALF (DRAPES) IMPLANT
DURAPREP 26ML APPLICATOR (WOUND CARE) ×2 IMPLANT
ELECT REM PT RETURN 9FT ADLT (ELECTROSURGICAL) ×2
ELECTRODE REM PT RTRN 9FT ADLT (ELECTROSURGICAL) ×1 IMPLANT
GAUZE SPONGE 4X4 16PLY XRAY LF (GAUZE/BANDAGES/DRESSINGS) IMPLANT
GLOVE BIOGEL PI IND STRL 7.5 (GLOVE) IMPLANT
GLOVE BIOGEL PI IND STRL 8.5 (GLOVE) ×1 IMPLANT
GLOVE BIOGEL PI INDICATOR 7.5 (GLOVE) ×1
GLOVE BIOGEL PI INDICATOR 8.5 (GLOVE) ×1
GLOVE ECLIPSE 7.5 STRL STRAW (GLOVE) ×2 IMPLANT
GLOVE ECLIPSE 8.5 STRL (GLOVE) ×2 IMPLANT
GLOVE EXAM NITRILE LRG STRL (GLOVE) IMPLANT
GLOVE EXAM NITRILE MD LF STRL (GLOVE) IMPLANT
GLOVE EXAM NITRILE XL STR (GLOVE) IMPLANT
GLOVE EXAM NITRILE XS STR PU (GLOVE) IMPLANT
GOWN BRE IMP SLV AUR LG STRL (GOWN DISPOSABLE) ×1 IMPLANT
GOWN BRE IMP SLV AUR XL STRL (GOWN DISPOSABLE) ×1 IMPLANT
GOWN STRL REIN 2XL LVL4 (GOWN DISPOSABLE) ×5 IMPLANT
KIT BASIN OR (CUSTOM PROCEDURE TRAY) ×2 IMPLANT
KIT ROOM TURNOVER OR (KITS) ×2 IMPLANT
NDL SPNL 20GX3.5 QUINCKE YW (NEEDLE) IMPLANT
NEEDLE HYPO 22GX1.5 SAFETY (NEEDLE) ×3 IMPLANT
NEEDLE SPNL 20GX3.5 QUINCKE YW (NEEDLE) IMPLANT
NS IRRIG 1000ML POUR BTL (IV SOLUTION) ×2 IMPLANT
PACK LAMINECTOMY NEURO (CUSTOM PROCEDURE TRAY) ×2 IMPLANT
PAD ARMBOARD 7.5X6 YLW CONV (MISCELLANEOUS) ×6 IMPLANT
PATTIES SURGICAL .5 X1 (DISPOSABLE) ×2 IMPLANT
RUBBERBAND STERILE (MISCELLANEOUS) IMPLANT
SPONGE GAUZE 4X4 12PLY (GAUZE/BANDAGES/DRESSINGS) ×2 IMPLANT
SPONGE SURGIFOAM ABS GEL SZ50 (HEMOSTASIS) ×2 IMPLANT
SUT VIC AB 1 CT1 18XBRD ANBCTR (SUTURE) ×1 IMPLANT
SUT VIC AB 1 CT1 8-18 (SUTURE) ×2
SUT VIC AB 2-0 CP2 18 (SUTURE) ×2 IMPLANT
SUT VIC AB 3-0 SH 8-18 (SUTURE) ×2 IMPLANT
SYR 20ML ECCENTRIC (SYRINGE) ×2 IMPLANT
TOWEL OR 17X24 6PK STRL BLUE (TOWEL DISPOSABLE) ×2 IMPLANT
TOWEL OR 17X26 10 PK STRL BLUE (TOWEL DISPOSABLE) ×2 IMPLANT
WATER STERILE IRR 1000ML POUR (IV SOLUTION) ×2 IMPLANT

## 2011-04-11 NOTE — Anesthesia Procedure Notes (Signed)
Procedure Name: Intubation Date/Time: 04/11/2011 11:02 AM Performed by: Rogelia Boga Pre-anesthesia Checklist: Patient identified, Emergency Drugs available, Suction available, Patient being monitored and Timeout performed Patient Re-evaluated:Patient Re-evaluated prior to inductionOxygen Delivery Method: Circle system utilized Preoxygenation: Pre-oxygenation with 100% oxygen Intubation Type: IV induction Ventilation: Mask ventilation without difficulty Laryngoscope Size: Mac and 4 Grade View: Grade I Tube type: Oral Tube size: 7.5 mm Number of attempts: 1 Airway Equipment and Method: Stylet Placement Confirmation: ETT inserted through vocal cords under direct vision,  positive ETCO2 and breath sounds checked- equal and bilateral Secured at: 20 cm Tube secured with: Tape Dental Injury: Teeth and Oropharynx as per pre-operative assessment

## 2011-04-11 NOTE — H&P (Signed)
04/11/2011 Change from previous note 04/06/2011.   [name] Richards. Little  #[id number]  DOB:  Mar 25, 1933     April 06, 2011   CHIEF COMPLAINT:   Severe pain in right hip and leg area.    HISTORY OF PRESENT ILLNESS:  [name] Little is a 78 year old, right-handed individual who tells me that she woke up this past Monday with severe pain in the region of the right hip.  She could not move.  She was taken to the emergency department by ambulance.  When there she was evaluated and found to have a large herniated nucleus pulposus at L5-S1 on the right side.  She was treated with pain medication and ultimately was able to be discharged home.  Since that time she has been using some Oxycodone a couple of times a day, in addition to some Valium to help make the pain more tolerable.  She notes that there is a twisting, turning pain in the buttock and hip on the right side, occasionally radiating down into the leg.  This has made it difficult for her to get around, but she would like something more definitive done.    PAST MEDICAL HISTORY:  She has factor V deficiency and has been on Coumadin for the past several years.  She has had breast cancer in 2010.  She has had a melanoma resected from the right calf and she has also had previous DVTs prior to the diagnosis of a factor V deficiency.  She has had a hip replacement in 1998 by Dr. Norlene Campbell and she has had a splenectomy by Darnell Level in 1997.  At that time she had ITP.    DRUG ALLERGIES:   SHE IS ALLERGIC TO SULFA WHICH CAUSES HIVES.    PERSONAL HISTORY:   She does not smoke nor does she use any alcohol.  Height and weight have been stable at 5', 3" and 166 lbs.    FAMILY HISTORY:    Notable for some heart disease and stroke in both parents.    MEDICATIONS:    Current medications include Diazepam 5 mg a day, Oxycodone/Acetaminophen in the form of Percocet as needed for pain, Caltrate, Fish Oil supplement, B12 supplement, Hydrochlorothiazide, Lisinopril and Coumadin 5 mg  a day.    REVIEW OF SYSTEMS:   Systems review is notable for back pain and leg pain on a 14-point review sheet.   [name] Richards. Speiser  #[id number]  DOB:  Apr 16, 1933     April 06, 2011  Page Two    PHYSICAL EXAMINATION:  She stands straight and erect, but she does walk with an antalgic gait involving the right lower extremity.  There is good strength in the major groups including the iliopsoas, quad, tibialis anterior and gastrocs and the reflexes are preserved in the patellae, but absent in the Achilles on that right side.    IMPRESSION:    The patient has evidence of an extruded fragment of disc at L5-S1 on the right side.  The disc is medium in size, but it is directly under the S1 nerve root in the exit foramen.  I indicated that this likely is going to require some surgical extirpation.  The patient is in the acute phase of this process and, unfortunately, because of her anticoagulated status, she is not capable of taking a nonsteroidal anti-inflammatory.  I believe that having her switch over to Lovenox to do bridge her anticoagulated status, having her stop the Coumadin and prepare her for surgery may be the  most expeditious way to deal with this process.  It is possible that the inflammatory process may calm and her pain may calm considerably and if she is comfortable enough, we can forego the surgical intervention.  However, the imaging study as it is now suggests that the S1 nerve root is, indeed, being compressed and if this persists, the pain will likely persist even with the passage of time in which case I believe that a simple microdiskectomy to decompress the S1 nerve root would be the best way to offer her relief.  There are the risks of surgery.  I discussed them with the patient and her daughter, who accompanies her, namely spinal fluid leakage, potential for infection.  There is also the potential for blood clot either as a result of deep venous thrombosis or postoperative hematoma and this is a  balanced risk based on her factor V deficiency.  Nonetheless, given the anatomic findings and the clinical picture, I believe that microdiskectomy would like give her the best and quickest relief.  We will plan this for the coming week.  Should the patient's pain resolve substantially, we can cancel the surgical intervention, but for the time being, we have this plan.    [facility]    Stefani Dama, M.D.  HJE:sv cc: [name] Richards. [address], [place], [place]  [address]   Dr. Illene Regulus

## 2011-04-11 NOTE — Progress Notes (Signed)
Subjective: Patient reports Feels comfortable mild pain and back  Objective: Vital signs in last 24 hours: Temp:  [97.7 F (36.5 C)-98 F (36.7 C)] 98 F (36.7 C) (03/12 1400) Pulse Rate:  [48-63] 58  (03/12 1400) Resp:  [18-28] 26  (03/12 1400) BP: (131-172)/(59-94) 170/59 mmHg (03/12 1345) SpO2:  [95 %-98 %] 97 % (03/12 1400)  Intake/Output from previous day:   Intake/Output this shift: Total I/O In: 1700 [I.V.:1700] Out: 200 [Urine:200]   incision clean and dry motor strength intact in lower extremity  Lab Results:  Basename 04/10/11 1012  WBC 8.4  HGB 14.3  HCT 43.0  PLT 371   BMET  Basename 04/10/11 1012  NA 142  K 4.4  CL 102  CO2 31  GLUCOSE 120*  BUN 20  CREATININE 0.74  CALCIUM 10.3    Studies/Results: Dg Chest 2 View  04/10/2011  *RADIOLOGY REPORT*  Clinical Data: Lumbar laminectomy and decompression microdiskectomy.  Preop chest radiograph  CHEST - 2 VIEW  Comparison: 08/30/2009  Findings: The heart size and mediastinal contours are within normal limits.  Both lungs are clear.  The visualized skeletal structures are unremarkable.  IMPRESSION: Negative exam.  Original Report Authenticated By: Rosealee Albee, M.D.   Dg Lumbar Spine 2-3 Views  04/11/2011  *RADIOLOGY REPORT*  Clinical Data: Microdiskectomy at L5 S1.  LUMBAR SPINE - 2-3 VIEW  Comparison: And MRI lumbar spine 04/03/2011  Findings: Numbering of the lumbar spine is per the recent MRI.  Imaged labeled #1, at 11:30 hours shows a metallic needle posterior to the L4-L5 disc space.  Imaged labeled #2 at 11"35 hours shows a metallic probe posterior to the L5-S1 disc space.  Imaged vertebral body heights are normal.  A hip arthroplasty is noted in the lateral projection.  IMPRESSION: 1.  Image labeled #1 labels the L4-L5 disc space.  2.  Imaged labeled #2 labels the L5-S1 disc space.  Original Report Authenticated By: Britta Mccreedy, M.D.    Assessment/Plan: Stable postop has voided already  LOS: 0  days  Discharge in a.m. if stable   Khristine Verno J 04/11/2011, 3:48 PM

## 2011-04-11 NOTE — Preoperative (Signed)
Beta Blockers   Reason not to administer Beta Blockers:Not Applicable 

## 2011-04-11 NOTE — Op Note (Signed)
Preoperative diagnosis: Herniated nucleus pulposus L5-S1 right with radiculopathy Postoperative diagnosis: Herniated nucleus pulposus L5-S1 right with radiculopathy Procedure: Microdiscectomy L5-S1 right with operating microscope microdissection technique Surgeon: Barnett Abu M.D. Assistant: Sharyon Cable M.D. Indications: Patient is a 78 year old individual who has had significant back and right leg pain for a number of weeks now she has failed efforts at conservative management and is quite debilitated I. the pain also is experiencing some weakness in the gastrocs on the right side she's been advised regarding the need for surgical decompression of L5-S1 on the right  Procedure: The patient was brought to the operating room supine on a stretcher. After the smooth induction of general endotracheal anesthesia patient was carefully turned to the prone position with the bony prominences being appropriately padded and protected. The lower lumbar spine was prepped with alcohol and DuraPrep and then draped in a sterile fashion. The needle was used to localize the area of L5-S1 and a radiograph was obtained demonstrating a needle at the level of L4-L5. Skin was infiltrated with 10 cc of lidocaine 1-100,000 epinephrine mixed 50-50 with half percent Marcaine. An incision was made and carried down to the lumbar dorsal fascia the fascia was opened on the right side of the midline and a subperiosteal dissection was performed in the interlaminar space of L5-S1. A self-retaining retractor was then placed into the wound. The yellow ligament was taken up from the interlaminar space at L5-S1 and the inferior margin of the lamina of L5 was removed and a partial facetectomy was performed at L5-S1. The common dural tube was explored and the take off of the S1 nerve root was identified and gradually mobilized. As the nerve root was retracted medially a fragment of disc material was noted to come into view lifting the nerve root  and retracting it medially identified several other fragments of disc from the disc space. The disc space was bulged posteriorly with a thickened portion of ligament near an opening into the disc space. The thickened ligament was excised and the disc space was opened. The disc space itself was noted to be very narrowed. A series of curettes and rongeurs was used to clean both medially and laterally and remove some remnants of degenerated disc material from within the disc space. This was done with the help of Dr. Gerlene Fee who worked from lateral to medial and myself working medial to lateral in the disc space. Ultimately the disc space was evacuated. The path of the nerve root was noted to be free and clear into the foramen. The wound was irrigated copiously with antibiotic irrigating solution. Hemostasis in the soft tissues was checked.   After adequate decompression was obtained hemostasis and the soft tissues was verified retractor was removed the operating microscope was removed from the field and the lumbar dorsal fascia was closed with #1 and 2-0 Vicryl in interrupted fashion. 3-0 Vicryl was used to close subcuticular skin. Blood loss was estimated at less than 50 cc.

## 2011-04-11 NOTE — Anesthesia Preprocedure Evaluation (Addendum)
Anesthesia Evaluation  Patient identified by MRN, date of birth, ID band Patient awake    Reviewed: Allergy & Precautions, H&P , NPO status , Patient's Chart, lab work & pertinent test results  Airway Mallampati: I TM Distance: >3 FB Neck ROM: full    Dental  (+) Teeth Intact, Caps and Dental Advisory Given   Pulmonary          Cardiovascular hypertension, Pt. on medications + dysrhythmias Supra Ventricular Tachycardia Rhythm:regular Rate:Normal     Neuro/Psych    GI/Hepatic   Endo/Other    Renal/GU      Musculoskeletal  (+) Arthritis -, Osteoarthritis,    Abdominal   Peds  Hematology  (+) Blood dyscrasia, ,   Anesthesia Other Findings Factor V Defficiency  Reproductive/Obstetrics                          Anesthesia Physical Anesthesia Plan  ASA: II  Anesthesia Plan: General ETT   Post-op Pain Management:    Induction: Intravenous  Airway Management Planned: Oral ETT  Additional Equipment:   Intra-op Plan:   Post-operative Plan: Extubation in OR  Informed Consent: I have reviewed the patients History and Physical, chart, labs and discussed the procedure including the risks, benefits and alternatives for the proposed anesthesia with the patient or authorized representative who has indicated his/her understanding and acceptance.   Dental advisory given  Plan Discussed with: Anesthesiologist, CRNA and Surgeon  Anesthesia Plan Comments:        Anesthesia Quick Evaluation

## 2011-04-11 NOTE — Anesthesia Postprocedure Evaluation (Signed)
  Anesthesia Post-op Note  Patient: [name] Little  Procedure(s) Performed: Procedure(s) (LRB): LUMBAR LAMINECTOMY/DECOMPRESSION MICRODISCECTOMY (N/A)  Patient Location: PACU  Anesthesia Type: General  Level of Consciousness: awake, alert , oriented and patient cooperative  Airway and Oxygen Therapy: Patient Spontanous Breathing and Patient connected to nasal cannula oxygen  Post-op Pain: mild  Post-op Assessment: Post-op Vital signs reviewed, Patient's Cardiovascular Status Stable, Respiratory Function Stable, Patent Airway, No signs of Nausea or vomiting and Pain level controlled  Post-op Vital Signs: stable  Complications: No apparent anesthesia complications

## 2011-04-11 NOTE — Transfer of Care (Signed)
Immediate Anesthesia Transfer of Care Note  Patient: [name] Little  Procedure(s) Performed: Procedure(s) (LRB): LUMBAR LAMINECTOMY/DECOMPRESSION MICRODISCECTOMY (N/A)  Patient Location: PACU  Anesthesia Type: General  Level of Consciousness: awake, alert , oriented and patient cooperative  Airway & Oxygen Therapy: Patient Spontanous Breathing and Patient connected to nasal cannula oxygen  Post-op Assessment: Report given to PACU RN, Post -op Vital signs reviewed and stable and Patient moving all extremities X 4  Post vital signs: Reviewed and stable  Complications: No apparent anesthesia complications

## 2011-04-11 NOTE — Discharge Instructions (Signed)
Wound Care °Leave incision open to air. °You may shower. °Do not scrub directly on incision.  °Do not put any creams, lotions, or ointments on incision. °Activity °Walk each and every day, increasing distance each day. °No lifting greater than 5 lbs.  Avoid excessive neck motion. °No driving for 2 weeks; may ride as a passenger locally. ° °Diet °Resume your normal diet.  °Return to Work °Will be discussed at you follow up appointment. °Call Your Doctor If Any of These Occur °Redness, drainage, or swelling at the wound.  °Temperature greater than 101 degrees. °Severe pain not relieved by pain medication. °Increased difficulty swallowing. °Incision starts to come apart. °Follow Up Appt °Call today for appointment in 4 weeks ([phone]) or for problems.  If you have any hardware placed in your spine, you will need an x-ray before your appointment. °

## 2011-04-12 MED ORDER — DIAZEPAM 5 MG PO TABS: 5.0000 mg | ORAL_TABLET | Freq: Three times a day (TID) | ORAL | Status: AC | PRN

## 2011-04-12 MED ORDER — OXYCODONE-ACETAMINOPHEN 5-325 MG PO TABS: 1.0000 | ORAL_TABLET | ORAL | Status: AC | PRN

## 2011-04-12 NOTE — Discharge Summary (Signed)
Physician Discharge Summary  Patient ID: [name] Little MRN: [id number] DOB/AGE: 03-20-1933 78 y.o.  Admit date: 04/11/2011 Discharge date: 04/12/2011  Admission Diagnoses: Herniated nucleus pulposus L5-S1 right with radiculopathy  Discharge Diagnoses: Nucleus pulposus L5-S1 right with radiculopathy, history of pulmonary emboli on chronic anticoagulation  Active Problems:  * No active hospital problems. *    Discharged Condition: good  Hospital Course: Patient was admitted to undergo surgical decompression at L5-S1 right side she had good relief of her pain postoperatively. She has been using Lovenox to bridge her anticoagulated condition. She'll restart her Coumadin today.  Consults: None  Significant Diagnostic Studies: None  Treatments: surgery: Microdiscectomy L5-S1 right with with operating microscope microdissection technique  Discharge Exam: Blood pressure 127/59, pulse 68, temperature 98 F (36.7 C), temperature source Oral, resp. rate 20, SpO2 95.00%. Motor function intact in lower extremities incision clean and dry  Disposition: 01-Home or Self Care  Discharge Orders    Future Appointments: Provider: Department: Dept Phone: Center:   04/14/2011 10:45 AM [facility] [phone] None   08/09/2011 10:00 AM [facility] [phone] [facility]     Future Orders Please Complete By Expires   Diet - low sodium heart healthy      Increase activity slowly      Discharge instructions      Comments:   Sit straight walk straight stand straight, mind your posture. Okay to shower, do not apply salves or ointments to incision for 2 weeks. Okay to drive after 2 weeks. No lifting more than 10 pounds.   Call MD for:  temperature >100.4      Call MD for:  severe uncontrolled pain      Call MD for:  redness, tenderness, or signs of infection (pain, swelling, redness, odor or green/yellow discharge around incision  site)        Medication List  As of 04/12/2011  9:15 AM   TAKE these medications         CALTRATE 600+D 600-400 MG-UNIT per tablet   Generic drug: Calcium Carbonate-Vitamin D   Take 1 tablet by mouth daily.      diazepam 5 MG tablet   Commonly known as: VALIUM   Take 5 mg by mouth every 8 (eight) hours as needed. For anxiety      diazepam 5 MG tablet   Commonly known as: VALIUM   Take 1 tablet (5 mg total) by mouth every 8 (eight) hours as needed (Muscle spasm).      enoxaparin 80 MG/0.8ML Soln injection   Commonly known as: LOVENOX   Inject 80 mg into the skin every 12 (twelve) hours.      FA-Cyanocobalamin-Pyridoxine 2.05-31-23 MG Tabs   Take 1 tablet by mouth daily.      Fish Oil 1000 MG Cpdr   Take 1 capsule by mouth daily.      hydrochlorothiazide 12.5 MG capsule   Commonly known as: MICROZIDE   Take 12.5 mg by mouth every morning.      lisinopril 10 MG tablet   Commonly known as: PRINIVIL,ZESTRIL   Take 10 mg by mouth every morning.      oxyCODONE-acetaminophen 5-325 MG per tablet   Commonly known as: PERCOCET   Take 1 tablet by mouth every 4 (four) hours as needed. For pain      oxyCODONE-acetaminophen 5-325 MG per tablet   Commonly known as: PERCOCET   Take 1-2 tablets by mouth every  3 (three) hours as needed for pain.      warfarin 5 MG tablet   Commonly known as: COUMADIN   Take 5 mg by mouth daily. Take 2.5mg  every Monday Wednesday and Friday Takes 5mg  all other days           Follow-up Information    Follow up with Stefani Dama, MD. Schedule an appointment as soon as possible for a visit in 3 weeks. (Call Aram Beecham for appointment)    Contact information:   [address], [address] [place] [address] [phone]          Signed: Stefani Dama 04/12/2011, 9:15 AM

## 2011-04-17 ENCOUNTER — Ambulatory Visit (INDEPENDENT_AMBULATORY_CARE_PROVIDER_SITE_OTHER): Payer: Medicare Other | Admitting: *Deleted

## 2011-04-17 ENCOUNTER — Encounter (HOSPITAL_COMMUNITY): Payer: Self-pay | Admitting: Neurological Surgery

## 2011-04-17 DIAGNOSIS — Z86718 Personal history of other venous thrombosis and embolism: Secondary | ICD-10-CM

## 2011-04-17 DIAGNOSIS — D682 Hereditary deficiency of other clotting factors: Secondary | ICD-10-CM

## 2011-04-17 LAB — POCT INR: INR: 1.2

## 2011-04-17 MED ORDER — ENOXAPARIN SODIUM 80 MG/0.8ML ~~LOC~~ SOLN: 80.0000 mg | Freq: Two times a day (BID) | SUBCUTANEOUS | Status: DC

## 2011-04-21 ENCOUNTER — Ambulatory Visit (INDEPENDENT_AMBULATORY_CARE_PROVIDER_SITE_OTHER): Payer: Medicare Other

## 2011-04-21 DIAGNOSIS — Z86718 Personal history of other venous thrombosis and embolism: Secondary | ICD-10-CM

## 2011-04-21 DIAGNOSIS — D682 Hereditary deficiency of other clotting factors: Secondary | ICD-10-CM

## 2011-04-21 LAB — POCT INR: INR: 2

## 2011-05-08 ENCOUNTER — Ambulatory Visit (INDEPENDENT_AMBULATORY_CARE_PROVIDER_SITE_OTHER): Payer: Medicare Other | Admitting: Pharmacist

## 2011-05-08 DIAGNOSIS — D682 Hereditary deficiency of other clotting factors: Secondary | ICD-10-CM

## 2011-05-08 DIAGNOSIS — Z86718 Personal history of other venous thrombosis and embolism: Secondary | ICD-10-CM

## 2011-05-08 LAB — POCT INR: INR: 1.8

## 2011-05-22 ENCOUNTER — Other Ambulatory Visit: Payer: Self-pay | Admitting: Cardiology

## 2011-05-29 ENCOUNTER — Ambulatory Visit (INDEPENDENT_AMBULATORY_CARE_PROVIDER_SITE_OTHER): Payer: Medicare Other

## 2011-05-29 DIAGNOSIS — Z86718 Personal history of other venous thrombosis and embolism: Secondary | ICD-10-CM

## 2011-05-29 DIAGNOSIS — D682 Hereditary deficiency of other clotting factors: Secondary | ICD-10-CM

## 2011-05-29 LAB — POCT INR: INR: 1.9

## 2011-06-04 ENCOUNTER — Other Ambulatory Visit: Payer: Self-pay | Admitting: Internal Medicine

## 2011-06-13 ENCOUNTER — Other Ambulatory Visit: Payer: Self-pay

## 2011-06-13 ENCOUNTER — Telehealth: Payer: Self-pay | Admitting: Internal Medicine

## 2011-06-13 ENCOUNTER — Other Ambulatory Visit (INDEPENDENT_AMBULATORY_CARE_PROVIDER_SITE_OTHER): Payer: Medicare Other

## 2011-06-13 ENCOUNTER — Other Ambulatory Visit: Payer: Self-pay | Admitting: Internal Medicine

## 2011-06-13 DIAGNOSIS — N3000 Acute cystitis without hematuria: Secondary | ICD-10-CM

## 2011-06-13 DIAGNOSIS — Z86718 Personal history of other venous thrombosis and embolism: Secondary | ICD-10-CM

## 2011-06-13 DIAGNOSIS — D682 Hereditary deficiency of other clotting factors: Secondary | ICD-10-CM

## 2011-06-13 LAB — URINALYSIS, ROUTINE W REFLEX MICROSCOPIC
Bilirubin Urine: NEGATIVE
Ketones, ur: NEGATIVE
Leukocytes, UA: NEGATIVE
Nitrite: POSITIVE
Specific Gravity, Urine: >=1.03 (ref 1.000–1.030)
Total Protein, Urine: NEGATIVE
Urine Glucose: NEGATIVE
Urobilinogen, UA: 0.2 (ref 0.0–1.0)
pH: 5 (ref 5.0–8.0)

## 2011-06-13 MED ORDER — CIPROFLOXACIN HCL 250 MG PO TABS: 250.0000 mg | ORAL_TABLET | Freq: Two times a day (BID) | ORAL | Status: AC

## 2011-06-13 NOTE — Telephone Encounter (Signed)
Pt advised of orders . 

## 2011-06-13 NOTE — Telephone Encounter (Signed)
Ok for u/a  595.0 

## 2011-06-13 NOTE — Telephone Encounter (Signed)
PT thinks she has a UTI.  Pain in lower abd and odor in the urine.  She would like to go to the lab for a UA.

## 2011-06-19 ENCOUNTER — Ambulatory Visit (INDEPENDENT_AMBULATORY_CARE_PROVIDER_SITE_OTHER): Payer: Medicare Other | Admitting: Pharmacist

## 2011-06-19 DIAGNOSIS — D682 Hereditary deficiency of other clotting factors: Secondary | ICD-10-CM

## 2011-06-19 DIAGNOSIS — Z86718 Personal history of other venous thrombosis and embolism: Secondary | ICD-10-CM

## 2011-06-19 LAB — POCT INR: INR: 2.5

## 2011-07-17 ENCOUNTER — Ambulatory Visit (INDEPENDENT_AMBULATORY_CARE_PROVIDER_SITE_OTHER): Payer: Medicare Other | Admitting: Pharmacist

## 2011-07-17 DIAGNOSIS — D682 Hereditary deficiency of other clotting factors: Secondary | ICD-10-CM

## 2011-07-17 DIAGNOSIS — Z86718 Personal history of other venous thrombosis and embolism: Secondary | ICD-10-CM

## 2011-07-17 LAB — POCT INR: INR: 2.7

## 2011-08-09 ENCOUNTER — Ambulatory Visit
Admission: RE | Admit: 2011-08-09 | Discharge: 2011-08-09 | Disposition: A | Discharge status: Home / Self Care | Payer: Medicare Other | Source: Ambulatory Visit | Attending: Oncology | Admitting: Oncology

## 2011-08-09 DIAGNOSIS — Z853 Personal history of malignant neoplasm of breast: Secondary | ICD-10-CM

## 2011-08-14 ENCOUNTER — Ambulatory Visit (INDEPENDENT_AMBULATORY_CARE_PROVIDER_SITE_OTHER): Payer: Medicare Other

## 2011-08-14 DIAGNOSIS — Z86718 Personal history of other venous thrombosis and embolism: Secondary | ICD-10-CM

## 2011-08-14 DIAGNOSIS — D682 Hereditary deficiency of other clotting factors: Secondary | ICD-10-CM

## 2011-08-14 LAB — POCT INR: INR: 3

## 2011-09-03 ENCOUNTER — Other Ambulatory Visit: Payer: Self-pay | Admitting: Internal Medicine

## 2011-09-28 ENCOUNTER — Other Ambulatory Visit: Payer: Self-pay | Admitting: Dermatology

## 2011-09-28 ENCOUNTER — Ambulatory Visit (INDEPENDENT_AMBULATORY_CARE_PROVIDER_SITE_OTHER): Payer: Medicare Other | Admitting: Pharmacist

## 2011-09-28 DIAGNOSIS — D682 Hereditary deficiency of other clotting factors: Secondary | ICD-10-CM

## 2011-09-28 DIAGNOSIS — Z86718 Personal history of other venous thrombosis and embolism: Secondary | ICD-10-CM

## 2011-09-28 LAB — POCT INR: INR: 2.8

## 2011-09-29 ENCOUNTER — Other Ambulatory Visit: Payer: Self-pay | Admitting: Cardiology

## 2011-10-03 ENCOUNTER — Other Ambulatory Visit: Payer: Self-pay | Admitting: *Deleted

## 2011-10-04 ENCOUNTER — Encounter: Payer: Self-pay | Admitting: General Practice

## 2011-11-06 ENCOUNTER — Ambulatory Visit (INDEPENDENT_AMBULATORY_CARE_PROVIDER_SITE_OTHER): Payer: Medicare Other | Admitting: Internal Medicine

## 2011-11-06 ENCOUNTER — Encounter: Payer: Self-pay | Admitting: Internal Medicine

## 2011-11-06 ENCOUNTER — Telehealth: Payer: Self-pay | Admitting: Internal Medicine

## 2011-11-06 VITALS — BP 155/80 | HR 71 | Temp 97.8°F | Resp 16 | Wt 175.0 lb

## 2011-11-06 DIAGNOSIS — I872 Venous insufficiency (chronic) (peripheral): Secondary | ICD-10-CM

## 2011-11-06 NOTE — Progress Notes (Signed)
Subjective:    Patient ID: [name] Little, female    DOB: Feb 13, 1933, 78 y.o.   MRN: [id number]  HPI [name] Little presents with increased leg swelling over the past 7-10 days. She has not had any long periods of sitting inactive: no plane flights, car trips, etc. She has not had any trauma. She denies any leg pain. Reviewed chart - normal renal function in March '13. No h/o CHF.  Past Medical History  Diagnosis Date  . Personal history of colonic polyps     hyperplastic  . Hemorrhoids   . Thoracic aortic aneurysm   . Paroxysmal supraventricular tachycardia   . Factor V deficiency   . TMJ pain dysfunction syndrome   . DVT (deep venous thrombosis)   . Hyperlipemia   . Blood clotting disorder     V5 factor  . Clostridium difficile infection   . Hypertension   . Infiltrating ductal carcinoma of breast     stage one right   . Blood dyscrasia     factor 5 deficiency  . Arthritis     lumbar stenosis, radiculopathy, OA- L hip   Past Surgical History  Procedure Date  . Total hip arthroplasty 1999    right  . Laparoscopic hysterectomy   . Splenectomy   . Right calf surgery     for cancer  . Colonoscopy   . Upper gastrointestinal endoscopy   . Joint replacement     R hip replacement   . Breast lumpectomy 2010    right Dr. Jamey Ripa  . Abdominal hysterectomy     1960- ? vaginal hysterectomy, not laparoscopic   . Eye surgery     bilateral cataracts- /w IOL   . Lumbar laminectomy/decompression microdiscectomy 04/11/2011    Procedure: LUMBAR LAMINECTOMY/DECOMPRESSION MICRODISCECTOMY;  Surgeon: Barnett Abu, MD;  Location: [facility];  Service: Neurosurgery;  Laterality: N/A;  Lumbar Five-Sacral One Microdiscectomy   Family History  Problem Relation Age of Onset  . Heart disease Mother   . Heart disease Father   . Heart disease      uncles  . Clotting disorder Maternal Uncle   . Breast cancer Paternal Aunt   . Cancer Paternal Aunt     breast  . Colon cancer Neg Hx   .  Anesthesia problems Neg Hx   . Hypotension Neg Hx   . Malignant hyperthermia Neg Hx   . Pseudochol deficiency Neg Hx    History   Social History  . Marital Status: Married    Spouse Name: N/A    Number of Children: 4  . Years of Education: N/A   Occupational History  . Retired    Social History Main Topics  . Smoking status: Never Smoker   . Smokeless tobacco: Never Used  . Alcohol Use: No  . Drug Use: No  . Sexually Active: Not on file   Other Topics Concern  . Not on file   Social History Narrative   Married [id number] sons - '59, '61, 2 daughters '55, '57; 8 grandchildren; 1 great grandI-ADLs    Current Outpatient Prescriptions on File Prior to Visit  Medication Sig Dispense Refill  . Calcium Carbonate-Vitamin D (CALTRATE 600+D) 600-400 MG-UNIT per tablet Take 1 tablet by mouth daily.       . diazepam (VALIUM) 5 MG tablet Take 5 mg by mouth every 8 (eight) hours as needed. For anxiety      . Folic Acid-Vit B6-Vit B12 (FA-CYANOCOBALAMIN-PYRIDOXINE) 2.05-31-23 MG TABS Take 1 tablet by  mouth daily.       . hydrochlorothiazide (MICROZIDE) 12.5 MG capsule Take 12.5 mg by mouth every morning.       . hydrochlorothiazide (MICROZIDE) 12.5 MG capsule TAKE ONE CAPSULE BY MOUTH EVERY DAY  30 capsule  8  . lisinopril (PRINIVIL,ZESTRIL) 10 MG tablet Take 10 mg by mouth every morning.       Marland Kitchen lisinopril (PRINIVIL,ZESTRIL) 10 MG tablet TAKE 1 TABLET BY MOUTH DAILY  30 tablet  3  . Omega-3 Fatty Acids (FISH OIL) 1000 MG CPDR Take 1 capsule by mouth daily.        Marland Kitchen warfarin (COUMADIN) 5 MG tablet Take as directed by coumadin clinic  35 tablet  3  . oxyCODONE-acetaminophen (PERCOCET) 5-325 MG per tablet Take 1 tablet by mouth every 4 (four) hours as needed. For pain          Review of Systems System review is negative for any constitutional, cardiac, pulmonary, GI or neuro symptoms or complaints other than as described in the HPI. Does complain of loss of energy.,     Objective:    Physical Exam Filed Vitals:   11/06/11 1633  BP: 155/80  Pulse: 71  Temp: 97.8 F (36.6 C)  Resp: 16   Gen'l- WNWD overweight white woman in no distress Cor-= 2+ raidal pulse, no JVD, quiet precordium, RRR Pulm - normal breath sounds Ext - negative Homan's, no calve tenderness, 2+ pitting edema L>R to level just below the knee       Assessment & Plan:

## 2011-11-06 NOTE — Telephone Encounter (Signed)
Patient scheduled.

## 2011-11-06 NOTE — Telephone Encounter (Signed)
The pt called and is hoping to be worked in today for leg swelling.  She states the area around her calf is swollen.  Please advise if you want her worked in this afternoon.   Thanks!

## 2011-11-06 NOTE — Patient Instructions (Addendum)
Peripheral edema - no evidence of heart failure, labs reviewed and no evidence of any renal failure. There is no sign on exam of blood clot, plus you are already on warfarin. Suspect this is venous insufficiency that has gotten a little worse.  BP Readings from Last 3 Encounters:  11/06/11 155/80  04/12/11 127/59  04/12/11 127/59   Pressure is up a little today.  Plan Increase HCTZ to 25 mg daily  Use support hose  For any increased swelling or leg pain  Please call so I can get an urgent appointment for a doppler study.  

## 2011-11-07 ENCOUNTER — Other Ambulatory Visit: Payer: Self-pay | Admitting: Internal Medicine

## 2011-11-07 MED ORDER — HYDROCHLOROTHIAZIDE 25 MG PO TABS: 25.0000 mg | ORAL_TABLET | Freq: Every day | ORAL | Status: DC

## 2011-11-07 NOTE — Telephone Encounter (Signed)
MEDICATION  HCTZ SENT TO [facility]. INCREASE IN DOSE TO 25MG  ONE TIME DAILY

## 2011-11-07 NOTE — Telephone Encounter (Signed)
Pt needs both of her blood pressure medicines called in to [facility] in [place].  She thinks the dose was to be elevated.

## 2011-11-08 NOTE — Assessment & Plan Note (Signed)
Peripheral edema - no evidence of heart failure, labs reviewed and no evidence of any renal failure. There is no sign on exam of blood clot, plus you are already on warfarin. Suspect this is venous insufficiency that has gotten a little worse.  BP Readings from Last 3 Encounters:  11/06/11 155/80  04/12/11 127/59  04/12/11 127/59   Pressure is up a little today.  Plan Increase HCTZ to 25 mg daily  Use support hose  For any increased swelling or leg pain  Please call so I can get an urgent appointment for a doppler study.

## 2011-11-09 ENCOUNTER — Ambulatory Visit (INDEPENDENT_AMBULATORY_CARE_PROVIDER_SITE_OTHER): Payer: Medicare Other | Admitting: General Practice

## 2011-11-09 DIAGNOSIS — Z86718 Personal history of other venous thrombosis and embolism: Secondary | ICD-10-CM

## 2011-11-09 DIAGNOSIS — D682 Hereditary deficiency of other clotting factors: Secondary | ICD-10-CM

## 2011-11-09 LAB — POCT INR: INR: 2.1

## 2011-12-16 ENCOUNTER — Other Ambulatory Visit: Payer: Self-pay | Admitting: Internal Medicine

## 2011-12-21 ENCOUNTER — Ambulatory Visit (INDEPENDENT_AMBULATORY_CARE_PROVIDER_SITE_OTHER): Payer: Medicare Other | Admitting: General Practice

## 2011-12-21 DIAGNOSIS — Z86718 Personal history of other venous thrombosis and embolism: Secondary | ICD-10-CM

## 2011-12-21 DIAGNOSIS — D682 Hereditary deficiency of other clotting factors: Secondary | ICD-10-CM

## 2011-12-21 LAB — POCT INR: INR: 2.3

## 2012-01-18 ENCOUNTER — Telehealth: Payer: Self-pay | Admitting: *Deleted

## 2012-01-18 NOTE — Telephone Encounter (Signed)
Patient called in and confirmed over the phone the new date and time

## 2012-02-02 ENCOUNTER — Ambulatory Visit (INDEPENDENT_AMBULATORY_CARE_PROVIDER_SITE_OTHER): Payer: Medicare Other | Admitting: General Practice

## 2012-02-02 DIAGNOSIS — D682 Hereditary deficiency of other clotting factors: Secondary | ICD-10-CM

## 2012-02-02 DIAGNOSIS — R0989 Other specified symptoms and signs involving the circulatory and respiratory systems: Secondary | ICD-10-CM

## 2012-02-02 DIAGNOSIS — Z86718 Personal history of other venous thrombosis and embolism: Secondary | ICD-10-CM

## 2012-02-02 LAB — POCT INR: INR: 2.8

## 2012-02-16 ENCOUNTER — Encounter (INDEPENDENT_AMBULATORY_CARE_PROVIDER_SITE_OTHER): Payer: Self-pay | Admitting: Surgery

## 2012-02-16 ENCOUNTER — Ambulatory Visit (INDEPENDENT_AMBULATORY_CARE_PROVIDER_SITE_OTHER): Payer: Medicare Other | Admitting: Surgery

## 2012-02-16 ENCOUNTER — Other Ambulatory Visit (INDEPENDENT_AMBULATORY_CARE_PROVIDER_SITE_OTHER): Payer: Self-pay | Admitting: Surgery

## 2012-02-16 VITALS — BP 128/82 | HR 66 | Temp 97.0°F | Resp 18 | Ht 63.0 in | Wt 172.0 lb

## 2012-02-16 DIAGNOSIS — C50919 Malignant neoplasm of unspecified site of unspecified female breast: Secondary | ICD-10-CM

## 2012-02-16 DIAGNOSIS — Z853 Personal history of malignant neoplasm of breast: Secondary | ICD-10-CM

## 2012-02-16 NOTE — Progress Notes (Signed)
02/16/2012  [name] Little is a 78 y.o.female who presents for routine followup of her Right Breast cancer, lower outer quadtrant, Stage I diagnosed in July 2010 and treated with Lumpectomy, SLN andRadiation. She has no problems or concerns on either side.  PFSH She has had no significant changes since the last visit here.  ROS There have been no significant changes since the last visit here last year she is concerned about a small scar around the medial clavicular area on the left. This is from her dermatologist to remove the nodule. This was reexcised it is now completely healed and she has no issues with this area.  General: The patient is alert, oriented, generally healty appearing, NAD. Mood and affect are normal.  Breasts: There is a loss of tissue on the right breast at the lumpectomy site in the lower outer quadrant however there is no dominant mass or suggestion of new breast cancer. The remainder of the right breast is normal. The left breast is normal. Lymphatics: She has no axillary or supraclavicular adenopathy on either side.  Extremities: Full ROM of the surgical side with no lymphedema noted.    Data Reviewed:Mammogram in July   IMPRESSION:  No findings worrisome for recurrent tumor or developing malignancy.  Recommend annual diagnostic mammography.  RECOMMENDATION:  Annual diagnostic mammography.  BI-RADS CATEGORY 1: Negative.  Original Report Authenticated By: Rolla Plate, M.D.   Impression:Stable exam, no problems noted  Plan: Will see again in one year. I told her after that she could follow up PRN

## 2012-02-16 NOTE — Patient Instructions (Signed)
Continue annual mammograms and annual followup. After next years checkup here I don't think we will need to follow you any longer.

## 2012-03-11 ENCOUNTER — Ambulatory Visit (INDEPENDENT_AMBULATORY_CARE_PROVIDER_SITE_OTHER): Payer: Medicare Other | Admitting: *Deleted

## 2012-03-11 DIAGNOSIS — D682 Hereditary deficiency of other clotting factors: Secondary | ICD-10-CM

## 2012-03-11 DIAGNOSIS — Z86718 Personal history of other venous thrombosis and embolism: Secondary | ICD-10-CM

## 2012-03-11 LAB — POCT INR: INR: 2.1

## 2012-03-12 ENCOUNTER — Other Ambulatory Visit: Payer: Self-pay | Admitting: Cardiology

## 2012-04-07 ENCOUNTER — Other Ambulatory Visit: Payer: Self-pay | Admitting: Cardiology

## 2012-04-08 ENCOUNTER — Ambulatory Visit (INDEPENDENT_AMBULATORY_CARE_PROVIDER_SITE_OTHER): Payer: Medicare Other | Admitting: Internal Medicine

## 2012-04-08 ENCOUNTER — Encounter: Payer: Self-pay | Admitting: Internal Medicine

## 2012-04-08 VITALS — BP 138/70 | HR 96 | Temp 98.0°F | Wt 175.0 lb

## 2012-04-08 DIAGNOSIS — J069 Acute upper respiratory infection, unspecified: Secondary | ICD-10-CM

## 2012-04-08 NOTE — Patient Instructions (Addendum)
Viral Upper respiratory infection, no fever, purulent mucus or other signs of a bacterial infection, thus no indication for antibiotics.  Plan Supportive care  Sudafed from behind the counter 30 mg  2 or maybe 3 times a day to relieve the sinus pressure and headache  Mucinex 1200 mg  Twice - mucolytic, thins secretions  Do not use an antihistamine, e.g. Benadryl  Tylenol for any aches, fevers or pain  Hydrate, stove top vaporizer.  If you run a fever (99 degrees or higher), have a change in mucus that is purulent with a bitter taste or any blood call me and we can add an antibiotic   Upper Respiratory Infection, Adult An upper respiratory infection (URI) is also sometimes known as the common cold. The upper respiratory tract includes the nose, sinuses, throat, trachea, and bronchi. Bronchi are the airways leading to the lungs. Most people improve within 1 week, but symptoms can last up to 2 weeks. A residual cough may last even longer.  CAUSES Many different viruses can infect the tissues lining the upper respiratory tract. The tissues become irritated and inflamed and often become very moist. Mucus production is also common. A cold is contagious. You can easily spread the virus to others by oral contact. This includes kissing, sharing a glass, coughing, or sneezing. Touching your mouth or nose and then touching a surface, which is then touched by another person, can also spread the virus. SYMPTOMS  Symptoms typically develop 1 to 3 days after you come in contact with a cold virus. Symptoms vary from person to person. They may include:  Runny nose.  Sneezing.  Nasal congestion.  Sinus irritation.  Sore throat.  Loss of voice (laryngitis).  Cough.  Fatigue.  Muscle aches.  Loss of appetite.  Headache.  Low-grade fever. DIAGNOSIS  You might diagnose your own cold based on familiar symptoms, since most people get a cold 2 to 3 times a year. Your caregiver can confirm this based  on your exam. Most importantly, your caregiver can check that your symptoms are not due to another disease such as strep throat, sinusitis, pneumonia, asthma, or epiglottitis. Blood tests, throat tests, and X-rays are not necessary to diagnose a common cold, but they may sometimes be helpful in excluding other more serious diseases. Your caregiver will decide if any further tests are required. RISKS AND COMPLICATIONS  You may be at risk for a more severe case of the common cold if you smoke cigarettes, have chronic heart disease (such as heart failure) or lung disease (such as asthma), or if you have a weakened immune system. The very young and very old are also at risk for more serious infections. Bacterial sinusitis, middle ear infections, and bacterial pneumonia can complicate the common cold. The common cold can worsen asthma and chronic obstructive pulmonary disease (COPD). Sometimes, these complications can require emergency medical care and may be life-threatening. PREVENTION  The best way to protect against getting a cold is to practice good hygiene. Avoid oral or hand contact with people with cold symptoms. Wash your hands often if contact occurs. There is no clear evidence that vitamin C, vitamin E, echinacea, or exercise reduces the chance of developing a cold. However, it is always recommended to get plenty of rest and practice good nutrition. TREATMENT  Treatment is directed at relieving symptoms. There is no cure. Antibiotics are not effective, because the infection is caused by a virus, not by bacteria. Treatment may include:  Increased fluid intake. Sports  drinks offer valuable electrolytes, sugars, and fluids.  Breathing heated mist or steam (vaporizer or shower).  Eating chicken soup or other clear broths, and maintaining good nutrition.  Getting plenty of rest.  Using gargles or lozenges for comfort.  Controlling fevers with ibuprofen or acetaminophen as directed by your  caregiver.  Increasing usage of your inhaler if you have asthma. Zinc gel and zinc lozenges, taken in the first 24 hours of the common cold, can shorten the duration and lessen the severity of symptoms. Pain medicines may help with fever, muscle aches, and throat pain. A variety of non-prescription medicines are available to treat congestion and runny nose. Your caregiver can make recommendations and may suggest nasal or lung inhalers for other symptoms.  HOME CARE INSTRUCTIONS   Only take over-the-counter or prescription medicines for pain, discomfort, or fever as directed by your caregiver.  Use a warm mist humidifier or inhale steam from a shower to increase air moisture. This may keep secretions moist and make it easier to breathe.  Drink enough water and fluids to keep your urine clear or pale yellow.  Rest as needed.  Return to work when your temperature has returned to normal or as your caregiver advises. You may need to stay home longer to avoid infecting others. You can also use a face mask and careful hand washing to prevent spread of the virus. SEEK MEDICAL CARE IF:   After the first few days, you feel you are getting worse rather than better.  You need your caregiver's advice about medicines to control symptoms.  You develop chills, worsening shortness of breath, or brown or red sputum. These may be signs of pneumonia.  You develop yellow or brown nasal discharge or pain in the face, especially when you bend forward. These may be signs of sinusitis.  You develop a fever, swollen neck glands, pain with swallowing, or white areas in the back of your throat. These may be signs of strep throat. SEEK IMMEDIATE MEDICAL CARE IF:   You have a fever.  You develop severe or persistent headache, ear pain, sinus pain, or chest pain.  You develop wheezing, a prolonged cough, cough up blood, or have a change in your usual mucus (if you have chronic lung disease).  You develop sore  muscles or a stiff neck. Document Released: 07/12/2000 Document Revised: 04/10/2011 Document Reviewed: 05/20/2010 [facility] Patient Information 2013 [place], [place].

## 2012-04-10 NOTE — Progress Notes (Signed)
  Subjective:    Patient ID: [name] Little, female    DOB: 1933-12-19, 79 y.o.   MRN: [id number]  HPI [name] Little presents with a short history of increased sinus congestion and rhinorrhea. She has not had fever, chills, SOB, productive cough. She denies N/V/D.  PMH, FamHx and SocHx reviewed for any changes and relevance. Current Outpatient Prescriptions on File Prior to Visit  Medication Sig Dispense Refill  . Folic Acid-Vit B6-Vit B12 (FA-CYANOCOBALAMIN-PYRIDOXINE) 2.05-31-23 MG TABS Take 1 tablet by mouth daily.       . hydrochlorothiazide (HYDRODIURIL) 25 MG tablet Take 1 tablet (25 mg total) by mouth daily.  30 tablet  6  . lisinopril (PRINIVIL,ZESTRIL) 10 MG tablet TAKE 1 TABLET BY MOUTH DAILY  30 tablet  3  . Omega-3 Fatty Acids (FISH OIL) 1000 MG CPDR Take 1 capsule by mouth daily.        Marland Kitchen warfarin (COUMADIN) 5 MG tablet TAKE AS DIRECTED BY COUMADIN CLINIC  35 tablet  3   No current facility-administered medications on file prior to visit.      Review of Systems System review is negative for any constitutional, cardiac, pulmonary, GI or neuro symptoms or complaints other than as described in the HPI.     Objective:   Physical Exam Filed Vitals:   04/08/12 1655  BP: 138/70  Pulse: 96  Temp: 98 F (36.7 C)   gen'l - WNWD white woman in no distress HEENT- tender to percussion over the frontal and maxillary sinus. Posterior pharynx clear w/o exudate Neck - supple Cor - RRR Pulm - normal respirations, no increased WOB, no wheezes or rales;       Assessment & Plan:  Viral Upper respiratory infection, no fever, purulent mucus or other signs of a bacterial infection, thus no indication for antibiotics.  Plan Supportive care  Sudafed from behind the counter 30 mg  2 or maybe 3 times a day to relieve the sinus pressure and headache  Mucinex 1200 mg  Twice - mucolytic, thins secretions  Do not use an antihistamine, e.g. Benadryl  Tylenol for any aches, fevers or  pain  Hydrate, stove top vaporizer.  If you run a fever (99 degrees or higher), have a change in mucus that is purulent with a bitter taste or any blood call me and we can add an antibiotic

## 2012-04-22 ENCOUNTER — Telehealth: Payer: Self-pay | Admitting: Cardiology

## 2012-04-22 ENCOUNTER — Other Ambulatory Visit: Payer: Self-pay | Admitting: Cardiology

## 2012-04-30 ENCOUNTER — Ambulatory Visit (INDEPENDENT_AMBULATORY_CARE_PROVIDER_SITE_OTHER): Payer: Medicare Other | Admitting: General Practice

## 2012-04-30 DIAGNOSIS — Z86718 Personal history of other venous thrombosis and embolism: Secondary | ICD-10-CM

## 2012-04-30 DIAGNOSIS — D682 Hereditary deficiency of other clotting factors: Secondary | ICD-10-CM

## 2012-04-30 LAB — POCT INR: INR: 2.2

## 2012-06-03 ENCOUNTER — Other Ambulatory Visit: Payer: Self-pay | Admitting: Internal Medicine

## 2012-06-11 ENCOUNTER — Ambulatory Visit (INDEPENDENT_AMBULATORY_CARE_PROVIDER_SITE_OTHER): Payer: Medicare Other | Admitting: General Practice

## 2012-06-11 DIAGNOSIS — Z86718 Personal history of other venous thrombosis and embolism: Secondary | ICD-10-CM

## 2012-06-11 DIAGNOSIS — D682 Hereditary deficiency of other clotting factors: Secondary | ICD-10-CM

## 2012-06-11 LAB — POCT INR: INR: 2.2

## 2012-07-23 ENCOUNTER — Ambulatory Visit (INDEPENDENT_AMBULATORY_CARE_PROVIDER_SITE_OTHER): Payer: Medicare Other | Admitting: General Practice

## 2012-07-23 DIAGNOSIS — Z86718 Personal history of other venous thrombosis and embolism: Secondary | ICD-10-CM

## 2012-07-23 DIAGNOSIS — D682 Hereditary deficiency of other clotting factors: Secondary | ICD-10-CM

## 2012-07-23 LAB — POCT INR: INR: 2

## 2012-08-01 ENCOUNTER — Other Ambulatory Visit: Payer: Self-pay | Admitting: *Deleted

## 2012-08-01 DIAGNOSIS — C50919 Malignant neoplasm of unspecified site of unspecified female breast: Secondary | ICD-10-CM

## 2012-08-05 ENCOUNTER — Other Ambulatory Visit (HOSPITAL_BASED_OUTPATIENT_CLINIC_OR_DEPARTMENT_OTHER): Payer: BC Managed Care – PPO

## 2012-08-05 ENCOUNTER — Other Ambulatory Visit (INDEPENDENT_AMBULATORY_CARE_PROVIDER_SITE_OTHER): Payer: Self-pay | Admitting: Surgery

## 2012-08-05 DIAGNOSIS — C50919 Malignant neoplasm of unspecified site of unspecified female breast: Secondary | ICD-10-CM

## 2012-08-05 DIAGNOSIS — Z853 Personal history of malignant neoplasm of breast: Secondary | ICD-10-CM

## 2012-08-05 LAB — CBC WITH DIFFERENTIAL/PLATELET
BASO%: 1.4 % (ref 0.0–2.0)
Basophils Absolute: 0.1 10*3/uL (ref 0.0–0.1)
EOS%: 3.1 % (ref 0.0–7.0)
Eosinophils Absolute: 0.3 10*3/uL (ref 0.0–0.5)
HCT: 41.9 % (ref 34.8–46.6)
HGB: 13.9 g/dL (ref 11.6–15.9)
LYMPH%: 38.9 % (ref 14.0–49.7)
MCH: 29.7 pg (ref 25.1–34.0)
MCHC: 33.2 g/dL (ref 31.5–36.0)
MCV: 89.5 fL (ref 79.5–101.0)
MONO#: 1.1 10*3/uL — ABNORMAL HIGH (ref 0.1–0.9)
MONO%: 13 % (ref 0.0–14.0)
NEUT#: 3.9 10*3/uL (ref 1.5–6.5)
NEUT%: 43.6 % (ref 38.4–76.8)
Platelets: 360 10*3/uL (ref 145–400)
RBC: 4.68 10*6/uL (ref 3.70–5.45)
RDW: 14.4 % (ref 11.2–14.5)
WBC: 8.8 10*3/uL (ref 3.9–10.3)
lymph#: 3.4 10*3/uL — ABNORMAL HIGH (ref 0.9–3.3)

## 2012-08-05 LAB — COMPREHENSIVE METABOLIC PANEL (CC13)
ALT: 18 U/L (ref 0–55)
AST: 23 U/L (ref 5–34)
Albumin: 3.5 g/dL (ref 3.5–5.0)
Alkaline Phosphatase: 67 U/L (ref 40–150)
BUN: 19.6 mg/dL (ref 7.0–26.0)
CO2: 29 mEq/L (ref 22–29)
Calcium: 9.6 mg/dL (ref 8.4–10.4)
Chloride: 104 mEq/L (ref 98–109)
Creatinine: 0.8 mg/dL (ref 0.6–1.1)
Glucose: 101 mg/dl (ref 70–140)
Potassium: 4.1 mEq/L (ref 3.5–5.1)
Sodium: 140 mEq/L (ref 136–145)
Total Bilirubin: 0.3 mg/dL (ref 0.20–1.20)
Total Protein: 7.7 g/dL (ref 6.4–8.3)

## 2012-08-09 ENCOUNTER — Ambulatory Visit
Admission: RE | Admit: 2012-08-09 | Discharge: 2012-08-09 | Disposition: A | Discharge status: Home / Self Care | Payer: Medicare Other | Source: Ambulatory Visit | Attending: Surgery | Admitting: Surgery

## 2012-08-09 DIAGNOSIS — Z853 Personal history of malignant neoplasm of breast: Secondary | ICD-10-CM

## 2012-08-12 ENCOUNTER — Telehealth: Payer: Self-pay | Admitting: *Deleted

## 2012-08-12 ENCOUNTER — Ambulatory Visit (HOSPITAL_BASED_OUTPATIENT_CLINIC_OR_DEPARTMENT_OTHER): Payer: Medicare Other | Admitting: Oncology

## 2012-08-12 VITALS — BP 168/76 | HR 71 | Temp 98.1°F | Resp 20 | Ht 63.0 in | Wt 175.7 lb

## 2012-08-12 DIAGNOSIS — C50919 Malignant neoplasm of unspecified site of unspecified female breast: Secondary | ICD-10-CM

## 2012-08-12 NOTE — Telephone Encounter (Signed)
appts made and printed...td 

## 2012-08-12 NOTE — Progress Notes (Signed)
ID: Samuella Cota OB: 1933/07/07  MR#: [id number]  WUJ#:[id number]  PCP: Illene Regulus, MD GYN:  Annamaria Helling SU: Cicero Duck OTHER MD: Dorothy Puffer, Sheryn Bison   HISTORY OF PRESENT ILLNESS: [name] Little is a 79 y.o. [facility] woman I saw remotely for evaluation of factor V Leiden-associated clotting disorders.  More recently, she had her annual screening mammogram July 01, 2008, which showed a possible mass in the right breast.  She was recalled for further studies on June 9 and spot compression views confirmed a small oval mass with poorly defined margins in the lateral right breast, which was not palpable by Dr Nicholos Johns on physical exam.  Ultrasound showed a 6 mm oval mass with mildly irregular microlobulated margins, which was hypoechoic and had internal blood flow with color Doppler.  The right axilla showed no abnormal-appearing lymph nodes.  Biopsy was obtained the same day and showed ([id number]) a strongly ER and PR positive (both 100%) invasive ductal carcinoma which appeared low grade, had an MIB-1 of 8% and was negative for HER-2 amplification by CISH, with a ratio of 1.13.  With this information, the patient was referred to Dr Jamey Ripa and bilateral breast MRIs were obtained on June 18.  This showed a solitary 1.5 cm area of enhancement in the lower outer quadrant of the right breast, which is the site of the previous biopsy-proven malignancy.  There was no evidence of malignancy in the left breast or in either axilla.    The patient proceeded to definitive needle-localized right lumpectomy and axillary sentinel lymph node biopsy July 30, 2008.  The final pathology [id number]) showed a 1.5 cm invasive ductal carcinoma, grade 1, with ample margins, no evidence of lymphovascular invasion and the sentinel lymph node negative for disease spread. Her sense history is as detailed below.  INTERVAL HISTORY: Rifky returns today for followup of her breast cancer. Interval history is  unremarkable. She continues to do a lot of your to work in the form. Her coag adenopathy is followed through the lab our clinic, who manages her coumadin.  REVIEW OF SYSTEMS: She has had no bleeding or clotting problems. She has not noticed any change in either breast. She does bruise easily, and she has had a couple of skin cancers removed, not melanomas. She remains reactive, doing all her household activities and quite a bit of farm work. A detailed review of systems was otherwise non-contributory  PAST MEDICAL HISTORY: Past Medical History  Diagnosis Date  . Personal history of colonic polyps     hyperplastic  . Hemorrhoids   . Thoracic aortic aneurysm   . Paroxysmal supraventricular tachycardia   . Factor V deficiency   . TMJ pain dysfunction syndrome   . DVT (deep venous thrombosis)   . Hyperlipemia   . Blood clotting disorder     V5 factor  . Clostridium difficile infection   . Hypertension   . Infiltrating ductal carcinoma of breast     stage one right   . Blood dyscrasia     factor 5 deficiency  . Arthritis     lumbar stenosis, radiculopathy, OA- L hip  . Clotting disorder     PAST SURGICAL HISTORY: Past Surgical History  Procedure Laterality Date  . Total hip arthroplasty  1999    right  . Laparoscopic hysterectomy    . Splenectomy    . Right calf surgery      for cancer  . Colonoscopy    . Upper gastrointestinal  endoscopy    . Joint replacement      R hip replacement   . Breast lumpectomy  2010    right Dr. Jamey Ripa  . Abdominal hysterectomy      1960- ? vaginal hysterectomy, not laparoscopic   . Eye surgery      bilateral cataracts- /w IOL   . Lumbar laminectomy/decompression microdiscectomy  04/11/2011    Procedure: LUMBAR LAMINECTOMY/DECOMPRESSION MICRODISCECTOMY;  Surgeon: Barnett Abu, MD;  Location: [facility];  Service: Neurosurgery;  Laterality: N/A;  Lumbar Five-Sacral One Microdiscectomy    FAMILY HISTORY Family History  Problem Relation Age  of Onset  . Heart disease Mother   . Heart disease Father   . Heart disease      uncles  . Clotting disorder Maternal Uncle   . Breast cancer Paternal Aunt   . Cancer Paternal Aunt     breast  . Colon cancer Neg Hx   . Anesthesia problems Neg Hx   . Hypotension Neg Hx   . Malignant hyperthermia Neg Hx   . Pseudochol deficiency Neg Hx   The patient's father died at the age of 59 from a stroke.  The patient's mother died at the age of 57, also from a stroke.  Neither had a history of DVTs.  The patient has 3 sisters and 1 brother.  No history of breast or ovarian cancers and the siblings do not have a history of coagulopathy.  GYNECOLOGIC HISTORY:  She is GX, P4, first pregnancy to term age 34.  Her hysterectomy was at age 80 and she took hormones for several years, perhaps 5 to 81, after that.  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  He also works as a Naval architect and she drove a school bus for 21 years.  Their children are Ginger, who lives in [place], [place], and works for [employer]; O'Neill in [place], who manages channel.  Debra's daughter (the patient's granddaughter), Leotis Shames, is  studying to be a Engineer, civil (consulting).  Son Tammy Sours lives in [place], [place], and works in Actuary.  Son Homero Fellers lives in [place] and is a Science writer for [employer].  The patient is a Control and instrumentation engineer.    ADVANCED DIRECTIVES: Not in place   HEALTH MAINTENANCE: History  Substance Use Topics  . Smoking status: Never Smoker   . Smokeless tobacco: Never Used  . Alcohol Use: No     Colonoscopy:  PAP:  Bone density:  Lipid panel:  Allergies  Allergen Reactions  . Cefuroxime Axetil     REACTION: Ddoes not remember reaction  . Sulfonamide Derivatives     REACTION: Rash    Current Outpatient Prescriptions  Medication Sig Dispense Refill  . Folic Acid-Vit B6-Vit B12 (FA-CYANOCOBALAMIN-PYRIDOXINE) 2.05-31-23 MG TABS Take 1 tablet by mouth daily.       . hydrochlorothiazide  (HYDRODIURIL) 25 MG tablet Take 1 tablet (25 mg total) by mouth daily.  30 tablet  6  . lisinopril (PRINIVIL,ZESTRIL) 10 MG tablet TAKE 1 TABLET BY MOUTH DAILY  30 tablet  3  . Omega-3 Fatty Acids (FISH OIL) 1000 MG CPDR Take 1 capsule by mouth daily.        Marland Kitchen warfarin (COUMADIN) 5 MG tablet TAKE AS DIRECTED BY COUMADIN CLINIC  35 tablet  3   No current facility-administered medications for this visit.    OBJECTIVE: Elderly white woman in no acute distress  Filed Vitals:   08/12/12 1007  BP: 168/76  Pulse: 71  Temp: 98.1 F (36.7 C)  Resp: 20     Body mass index is 31.13 kg/(m^2).    ECOG FS: 0  Sclerae unicteric Oropharynx clear No cervical or supraclavicular adenopathy Lungs no rales or rhonchi Heart regular rate and rhythm Abd benign MSK no focal spinal tenderness, no peripheral edema Neuro: non-focal, well-oriented, appropriate affect Breasts: The right breast is status post lumpectomy and radiation. There is no evidence of local recurrence. The right axilla is benign. The left breast is unremarkable   LAB RESULTS:  CMP     Component Value Date/Time   NA 140 08/05/2012 0957   NA 142 04/10/2011 1012   K 4.1 08/05/2012 0957   K 4.4 04/10/2011 1012   CL 102 04/10/2011 1012   CO2 29 08/05/2012 0957   CO2 31 04/10/2011 1012   GLUCOSE 101 08/05/2012 0957   GLUCOSE 120* 04/10/2011 1012   BUN 19.6 08/05/2012 0957   BUN 20 04/10/2011 1012   CREATININE 0.8 08/05/2012 0957   CREATININE 0.74 04/10/2011 1012   CALCIUM 9.6 08/05/2012 0957   CALCIUM 10.3 04/10/2011 1012   PROT 7.7 08/05/2012 0957   PROT 7.0 07/12/2010 1328   ALBUMIN 3.5 08/05/2012 0957   ALBUMIN 4.0 07/12/2010 1328   AST 23 08/05/2012 0957   AST 32 07/12/2010 1328   ALT 18 08/05/2012 0957   ALT 21 07/12/2010 1328   ALKPHOS 67 08/05/2012 0957   ALKPHOS 49 07/12/2010 1328   BILITOT 0.30 08/05/2012 0957   BILITOT 0.4 07/12/2010 1328   GFRNONAA 81* 04/10/2011 1012   GFRAA >90 04/10/2011 1012    I No results found for this basename: SPEP,  UPEP,  kappa and lambda light chains    Lab Results  Component Value Date   WBC 8.8 08/05/2012   NEUTROABS 3.9 08/05/2012   HGB 13.9 08/05/2012   HCT 41.9 08/05/2012   MCV 89.5 08/05/2012   PLT 360 08/05/2012      Chemistry      Component Value Date/Time   NA 140 08/05/2012 0957   NA 142 04/10/2011 1012   K 4.1 08/05/2012 0957   K 4.4 04/10/2011 1012   CL 102 04/10/2011 1012   CO2 29 08/05/2012 0957   CO2 31 04/10/2011 1012   BUN 19.6 08/05/2012 0957   BUN 20 04/10/2011 1012   CREATININE 0.8 08/05/2012 0957   CREATININE 0.74 04/10/2011 1012      Component Value Date/Time   CALCIUM 9.6 08/05/2012 0957   CALCIUM 10.3 04/10/2011 1012   ALKPHOS 67 08/05/2012 0957   ALKPHOS 49 07/12/2010 1328   AST 23 08/05/2012 0957   AST 32 07/12/2010 1328   ALT 18 08/05/2012 0957   ALT 21 07/12/2010 1328   BILITOT 0.30 08/05/2012 0957   BILITOT 0.4 07/12/2010 1328       Lab Results  Component Value Date   LABCA2 19 07/12/2010    No components found with this basename: [id number]    No results found for this basename: INR,  in the last 168 hours  Urinalysis    Component Value Date/Time   COLORURINE YELLOW 06/13/2011 1654   APPEARANCEUR CLEAR 06/13/2011 1654   LABSPEC >=1.030 06/13/2011 1654   PHURINE 5.0 06/13/2011 1654   GLUCOSEU NEGATIVE 07/28/2008 1050   HGBUR TRACE-LYSED 06/13/2011 1654   BILIRUBINUR NEGATIVE 06/13/2011 1654   KETONESUR NEGATIVE 06/13/2011 1654   PROTEINUR NEGATIVE 07/28/2008 1050   UROBILINOGEN 0.2 06/13/2011 1654   NITRITE POSITIVE 06/13/2011 1654   LEUKOCYTESUR NEGATIVE  06/13/2011 1654    STUDIES: Mm Digital Diagnostic Bilat  08/09/2012   *RADIOLOGY REPORT*  Clinical Data:  79 year old female with history of right breast cancer status post lumpectomy in 2010.  DIGITAL DIAGNOSTIC BILATERAL MAMMOGRAM WITH CAD DIGITAL BREAST TOMOSYNTHESIS  Digital breast tomosynthesis images are acquired in two projections.  These images are reviewed in combination with the digital mammogram, confirming the findings  below.  Comparison: Multiple priors.  Findings:  ACR Breast Density Category b:  There are scattered areas of fibroglandular density.  Standard CC and MLO views of both breasts were obtained and should addition to a spot magnification view of the right breast. Tomosynthesis views were also obtained of both breasts.  No suspicious microcalcifications, masses, or new architectural distortion.  Stable post lumpectomy changes in the right breast.  Mammographic images were processed with CAD.  IMPRESSION: BI-RADS CATEGORY 2:  Benign finding(s).  RECOMMENDATION: Bilateral diagnostic mammogram in 1 year.  I have discussed the findings and recommendations with the patient. Results were also provided in writing at the conclusion of the visit.  If applicable, a reminder letter will be sent to the patient regarding her next appointment.   Original Report Authenticated By: Jerene Dilling, M.D.    ASSESSMENT: 79 y.o. [place] woman   (1) status post right lumpectomy and sentinel lymph node sampling in July 2010 for a T1c N0, grade 1 invasive ductal carcinoma, strongly ER/PR positive, HER2/neu negative, with low MIB-1.   (2) Status post adjuvant radiation therapy completed in August 2010.   (3) Since that time, she has tried both anastrozole and letrozole, unable to tolerate either medication due to joint pain.  Also tried exemestane between August 2012 and October 2012, also discontinued due to intolerance and joint pain. Not a tamoxifen candidate  (4) The patient is factor V Leiden positive and is on chronic coumadinization, with Coumadin followed by Cardiology.    PLAN: Ebonee is doing fine from a breast cancer point of view. I am going to see her one more time, a year from now, and she will "graduate" from breast cancer followup at that time. She knows to call for any problems that may develop before that visit.   Lowella Dell, MD   08/12/2012 10:20 AM

## 2012-08-27 ENCOUNTER — Other Ambulatory Visit: Payer: Self-pay | Admitting: Cardiology

## 2012-08-27 NOTE — Telephone Encounter (Signed)
Pt wants [facility] and its not on the list, most recent Lab was up, but they havent been here in awhile either.

## 2012-08-28 NOTE — Telephone Encounter (Signed)
Left message for pt to call.

## 2012-09-03 ENCOUNTER — Ambulatory Visit (INDEPENDENT_AMBULATORY_CARE_PROVIDER_SITE_OTHER): Payer: Medicare Other | Admitting: General Practice

## 2012-09-03 DIAGNOSIS — Z86718 Personal history of other venous thrombosis and embolism: Secondary | ICD-10-CM

## 2012-09-03 DIAGNOSIS — D682 Hereditary deficiency of other clotting factors: Secondary | ICD-10-CM

## 2012-09-03 LAB — POCT INR: INR: 2.2

## 2012-09-13 NOTE — Telephone Encounter (Signed)
Pt did not return calls. Will need to contact primary or make an appt.

## 2012-09-17 ENCOUNTER — Other Ambulatory Visit: Payer: Self-pay | Admitting: Cardiology

## 2012-09-19 ENCOUNTER — Other Ambulatory Visit: Payer: Self-pay | Admitting: General Practice

## 2012-09-19 MED ORDER — WARFARIN SODIUM 5 MG PO TABS: ORAL_TABLET | ORAL | Status: DC

## 2012-10-15 ENCOUNTER — Ambulatory Visit (INDEPENDENT_AMBULATORY_CARE_PROVIDER_SITE_OTHER): Payer: Medicare Other | Admitting: General Practice

## 2012-10-15 DIAGNOSIS — D682 Hereditary deficiency of other clotting factors: Secondary | ICD-10-CM

## 2012-10-15 DIAGNOSIS — Z86718 Personal history of other venous thrombosis and embolism: Secondary | ICD-10-CM

## 2012-10-15 LAB — POCT INR: INR: 2.3

## 2012-11-01 ENCOUNTER — Ambulatory Visit: Payer: Medicare Other

## 2012-11-26 ENCOUNTER — Ambulatory Visit (INDEPENDENT_AMBULATORY_CARE_PROVIDER_SITE_OTHER): Payer: Medicare Other | Admitting: General Practice

## 2012-11-26 DIAGNOSIS — D682 Hereditary deficiency of other clotting factors: Secondary | ICD-10-CM

## 2012-11-26 DIAGNOSIS — Z86718 Personal history of other venous thrombosis and embolism: Secondary | ICD-10-CM

## 2012-11-26 DIAGNOSIS — Z23 Encounter for immunization: Secondary | ICD-10-CM

## 2012-11-26 LAB — POCT INR: INR: 1.7

## 2012-12-19 ENCOUNTER — Other Ambulatory Visit: Payer: Self-pay | Admitting: Dermatology

## 2012-12-24 ENCOUNTER — Ambulatory Visit (INDEPENDENT_AMBULATORY_CARE_PROVIDER_SITE_OTHER): Payer: Medicare Other | Admitting: General Practice

## 2012-12-24 DIAGNOSIS — Z86718 Personal history of other venous thrombosis and embolism: Secondary | ICD-10-CM

## 2012-12-24 DIAGNOSIS — D682 Hereditary deficiency of other clotting factors: Secondary | ICD-10-CM

## 2012-12-24 LAB — POCT INR: INR: 2

## 2012-12-24 NOTE — Progress Notes (Signed)
Pre-visit discussion using our clinic review tool. No additional management support is needed unless otherwise documented below in the visit note.  

## 2013-01-14 ENCOUNTER — Encounter (INDEPENDENT_AMBULATORY_CARE_PROVIDER_SITE_OTHER): Payer: Self-pay | Admitting: General Surgery

## 2013-01-15 ENCOUNTER — Encounter: Payer: Self-pay | Admitting: Internal Medicine

## 2013-01-15 ENCOUNTER — Other Ambulatory Visit: Payer: Self-pay | Admitting: Internal Medicine

## 2013-01-15 ENCOUNTER — Ambulatory Visit (INDEPENDENT_AMBULATORY_CARE_PROVIDER_SITE_OTHER): Payer: Medicare Other | Admitting: General Practice

## 2013-01-15 ENCOUNTER — Ambulatory Visit (INDEPENDENT_AMBULATORY_CARE_PROVIDER_SITE_OTHER): Payer: Medicare Other | Admitting: Internal Medicine

## 2013-01-15 VITALS — BP 158/86 | HR 75 | Temp 99.5°F | Wt 166.0 lb

## 2013-01-15 DIAGNOSIS — D682 Hereditary deficiency of other clotting factors: Secondary | ICD-10-CM

## 2013-01-15 DIAGNOSIS — Z86718 Personal history of other venous thrombosis and embolism: Secondary | ICD-10-CM

## 2013-01-15 DIAGNOSIS — J020 Streptococcal pharyngitis: Secondary | ICD-10-CM

## 2013-01-15 LAB — POCT INR: INR: 2

## 2013-01-15 MED ORDER — PENICILLIN V POTASSIUM 500 MG PO TABS: 500.0000 mg | ORAL_TABLET | Freq: Three times a day (TID) | ORAL | Status: DC

## 2013-01-15 NOTE — Patient Instructions (Signed)
Happy Holidays  Strep throat on the basis of history and exam.  Plan Pen VK 500 mg 3 times a day for 10 days  Gargle of choice  Tylenol 500 mg 1 or 2 three times a day for fever and pain.   Blood pressure - if it stays elevated for much longer we will need to adjust your medications.

## 2013-01-15 NOTE — Progress Notes (Signed)
Pre-visit discussion using our clinic review tool. No additional management support is needed unless otherwise documented below in the visit note.  

## 2013-01-15 NOTE — Progress Notes (Signed)
Subjective:    Patient ID: [name] Little, female    DOB: 1933-11-23, 79 y.o.   MRN: [id number]  HPI [name] Little presents with a several day h/o sore throat with fever, myalgia and cough. She was exposed to a sick grandchild. She has odynophagia but can eat and drink.   BP has been running high for several days which she attributes to stress factors which are transitory.  Past Medical History  Diagnosis Date  . Personal history of colonic polyps     hyperplastic  . Hemorrhoids   . Thoracic aortic aneurysm   . Paroxysmal supraventricular tachycardia   . Factor V deficiency   . TMJ pain dysfunction syndrome   . DVT (deep venous thrombosis)   . Hyperlipemia   . Blood clotting disorder     V5 factor  . Clostridium difficile infection   . Hypertension   . Infiltrating ductal carcinoma of breast     stage one right   . Blood dyscrasia     factor 5 deficiency  . Arthritis     lumbar stenosis, radiculopathy, OA- L hip  . Clotting disorder    Past Surgical History  Procedure Laterality Date  . Total hip arthroplasty  1999    right  . Laparoscopic hysterectomy    . Splenectomy    . Right calf surgery      for cancer  . Colonoscopy    . Upper gastrointestinal endoscopy    . Joint replacement      R hip replacement   . Breast lumpectomy  2010    right Dr. Jamey Little  . Abdominal hysterectomy      1960- ? vaginal hysterectomy, not laparoscopic   . Eye surgery      bilateral cataracts- /w IOL   . Lumbar laminectomy/decompression microdiscectomy  04/11/2011    Procedure: LUMBAR LAMINECTOMY/DECOMPRESSION MICRODISCECTOMY;  Surgeon: [name] Abu, MD;  Location: [facility];  Service: Neurosurgery;  Laterality: N/A;  Lumbar Five-Sacral One Microdiscectomy   Family History  Problem Relation Age of Onset  . Heart disease Mother   . Heart disease Father   . Heart disease      uncles  . Clotting disorder Maternal Uncle   . Breast cancer Paternal Aunt   . Cancer Paternal Aunt     breast  . Colon cancer Neg Hx   . Anesthesia problems Neg Hx   . Hypotension Neg Hx   . Malignant hyperthermia Neg Hx   . Pseudochol deficiency Neg Hx    History   Social History  . Marital Status: Married    Spouse Name: N/A    Number of Children: 4  . Years of Education: N/A   Occupational History  . Retired    Social History Main Topics  . Smoking status: Never Smoker   . Smokeless tobacco: Never Used  . Alcohol Use: No  . Drug Use: No  . Sexual Activity: Not on file   Other Topics Concern  . Not on file   Social History Narrative   Married 1955   2 sons - '59, '61, 2 daughters '55, '57; 8 grandchildren; 1 great grand   I-ADLs          Current Outpatient Prescriptions on File Prior to Visit  Medication Sig Dispense Refill  . Folic Acid-Vit B6-Vit B12 (FA-CYANOCOBALAMIN-PYRIDOXINE) 2.05-31-23 MG TABS Take 1 tablet by mouth daily.       . hydrochlorothiazide (HYDRODIURIL) 25 MG tablet Take 1 tablet (25 mg  total) by mouth daily.  30 tablet  6  . lisinopril (PRINIVIL,ZESTRIL) 10 MG tablet TAKE 1 TABLET BY MOUTH DAILY  30 tablet  3  . Omega-3 Fatty Acids (FISH OIL) 1000 MG CPDR Take 1 capsule by mouth daily.        Marland Kitchen warfarin (COUMADIN) 5 MG tablet TAKE AS DIRECTED BY COUMADIN CLINIC  35 tablet  3   No current facility-administered medications on file prior to visit.      Review of Systems System review is negative for any constitutional, cardiac, pulmonary, GI or neuro symptoms or complaints other than as described in the HPI.     Objective:   Physical Exam Filed Vitals:   01/15/13 1516  BP: 158/86  Pulse: 75  Temp: 99.5 F (37.5 C)   BP Readings from Last 3 Encounters:  01/15/13 158/86  08/12/12 168/76  04/08/12 138/70   Gen'l- overweight woman in no distress HEENT - no sinus tenderness, posterior pharynx with erythema and some exudate, TM's pearly Nodes - shotty submandibular nodes Cor - RRR Pulm - normal       Assessment & Plan:  Strep  throat on the basis of history and exam.  Plan Pen VK 500 mg 3 times a day for 10 days  Gargle of choice  Tylenol 500 mg 1 or 2 three times a day for fever and pain.

## 2013-01-15 NOTE — Progress Notes (Signed)
Pre visit review using our clinic review tool, if applicable. No additional management support is needed unless otherwise documented below in the visit note. 

## 2013-01-20 ENCOUNTER — Telehealth: Payer: Self-pay

## 2013-01-20 NOTE — Telephone Encounter (Signed)
Per Dr Debby Bud please schedule patient for an office visit.

## 2013-01-20 NOTE — Telephone Encounter (Signed)
Called pt to schedule apt - tomorrow afternoon is already double booked twice, so I scheduled for Wednesday am at 8:45am (before the 1st apt)- is this okay? I told the patient that if this did not work with Dr.Norin's schedule, I would call her back.   The pt is still concerned, and hoping to see Dr.Norins before Christmas.  Let me know what works best!  Thanks!

## 2013-01-20 NOTE — Telephone Encounter (Signed)
OKwith 8:45 appt - you may need to remind me or send me a text message that morning: [phone]  Thank you

## 2013-01-20 NOTE — Telephone Encounter (Signed)
The patient called and is stating she is worried about her blood pressure.  She states it is running high still, and is hoping to switch blood pressure medicines.

## 2013-01-20 NOTE — Telephone Encounter (Signed)
OV for BP med change

## 2013-01-22 ENCOUNTER — Ambulatory Visit (INDEPENDENT_AMBULATORY_CARE_PROVIDER_SITE_OTHER): Payer: Medicare Other | Admitting: Internal Medicine

## 2013-01-22 ENCOUNTER — Encounter: Payer: Self-pay | Admitting: Internal Medicine

## 2013-01-22 ENCOUNTER — Encounter (HOSPITAL_COMMUNITY): Payer: Self-pay | Admitting: Emergency Medicine

## 2013-01-22 ENCOUNTER — Emergency Department (HOSPITAL_COMMUNITY)
Admission: EM | Admit: 2013-01-22 | Discharge: 2013-01-22 | Disposition: A | Discharge status: Home / Self Care | Payer: Medicare Other | Attending: Emergency Medicine | Admitting: Emergency Medicine

## 2013-01-22 VITALS — BP 152/80 | HR 79 | Temp 98.0°F | Wt 165.6 lb

## 2013-01-22 DIAGNOSIS — Z860100 Personal history of colon polyps, unspecified: Secondary | ICD-10-CM | POA: Insufficient documentation

## 2013-01-22 DIAGNOSIS — Z8739 Personal history of other diseases of the musculoskeletal system and connective tissue: Secondary | ICD-10-CM | POA: Insufficient documentation

## 2013-01-22 DIAGNOSIS — Z8639 Personal history of other endocrine, nutritional and metabolic disease: Secondary | ICD-10-CM | POA: Insufficient documentation

## 2013-01-22 DIAGNOSIS — Z7901 Long term (current) use of anticoagulants: Secondary | ICD-10-CM | POA: Insufficient documentation

## 2013-01-22 DIAGNOSIS — Z86718 Personal history of other venous thrombosis and embolism: Secondary | ICD-10-CM | POA: Insufficient documentation

## 2013-01-22 DIAGNOSIS — I10 Essential (primary) hypertension: Secondary | ICD-10-CM | POA: Insufficient documentation

## 2013-01-22 DIAGNOSIS — Z8719 Personal history of other diseases of the digestive system: Secondary | ICD-10-CM | POA: Insufficient documentation

## 2013-01-22 DIAGNOSIS — R51 Headache: Secondary | ICD-10-CM | POA: Insufficient documentation

## 2013-01-22 DIAGNOSIS — Z8619 Personal history of other infectious and parasitic diseases: Secondary | ICD-10-CM | POA: Insufficient documentation

## 2013-01-22 DIAGNOSIS — Z792 Long term (current) use of antibiotics: Secondary | ICD-10-CM | POA: Insufficient documentation

## 2013-01-22 DIAGNOSIS — Z853 Personal history of malignant neoplasm of breast: Secondary | ICD-10-CM | POA: Insufficient documentation

## 2013-01-22 DIAGNOSIS — Z862 Personal history of diseases of the blood and blood-forming organs and certain disorders involving the immune mechanism: Secondary | ICD-10-CM | POA: Insufficient documentation

## 2013-01-22 DIAGNOSIS — Z79899 Other long term (current) drug therapy: Secondary | ICD-10-CM | POA: Insufficient documentation

## 2013-01-22 LAB — URINALYSIS, ROUTINE W REFLEX MICROSCOPIC
Bilirubin Urine: NEGATIVE
Glucose, UA: NEGATIVE mg/dL
Hgb urine dipstick: NEGATIVE
Ketones, ur: NEGATIVE mg/dL
Leukocytes, UA: NEGATIVE
Nitrite: NEGATIVE
Protein, ur: NEGATIVE mg/dL
Specific Gravity, Urine: 1.009 (ref 1.005–1.030)
Urobilinogen, UA: 0.2 mg/dL (ref 0.0–1.0)
pH: 7 (ref 5.0–8.0)

## 2013-01-22 LAB — COMPREHENSIVE METABOLIC PANEL
ALT: 12 U/L (ref 0–35)
AST: 20 U/L (ref 0–37)
Albumin: 3.8 g/dL (ref 3.5–5.2)
Alkaline Phosphatase: 70 U/L (ref 39–117)
BUN: 20 mg/dL (ref 6–23)
CO2: 28 mEq/L (ref 19–32)
Calcium: 9.4 mg/dL (ref 8.4–10.5)
Chloride: 100 mEq/L (ref 96–112)
Creatinine, Ser: 0.78 mg/dL (ref 0.50–1.10)
GFR calc Af Amer: >90 mL/min (ref >90)
GFR calc non Af Amer: 79 mL/min — ABNORMAL LOW (ref >90)
Glucose, Bld: 110 mg/dL — ABNORMAL HIGH (ref 70–99)
Potassium: 3.5 mEq/L (ref 3.5–5.1)
Sodium: 139 mEq/L (ref 135–145)
Total Bilirubin: 0.3 mg/dL (ref 0.3–1.2)
Total Protein: 7.7 g/dL (ref 6.0–8.3)

## 2013-01-22 LAB — CBC WITH DIFFERENTIAL/PLATELET
Basophils Absolute: 0.1 10*3/uL (ref 0.0–0.1)
Basophils Relative: 1 % (ref 0–1)
Eosinophils Absolute: 0.3 10*3/uL (ref 0.0–0.7)
Eosinophils Relative: 3 % (ref 0–5)
HCT: 42.3 % (ref 36.0–46.0)
Hemoglobin: 14.4 g/dL (ref 12.0–15.0)
Lymphocytes Relative: 39 % (ref 12–46)
Lymphs Abs: 3.8 10*3/uL (ref 0.7–4.0)
MCH: 30.8 pg (ref 26.0–34.0)
MCHC: 34 g/dL (ref 30.0–36.0)
MCV: 90.4 fL (ref 78.0–100.0)
Monocytes Absolute: 1.2 10*3/uL — ABNORMAL HIGH (ref 0.1–1.0)
Monocytes Relative: 13 % — ABNORMAL HIGH (ref 3–12)
Neutro Abs: 4.4 10*3/uL (ref 1.7–7.7)
Neutrophils Relative %: 45 % (ref 43–77)
Platelets: 374 10*3/uL (ref 150–400)
RBC: 4.68 MIL/uL (ref 3.87–5.11)
RDW: 14.6 % (ref 11.5–15.5)
WBC: 9.7 10*3/uL (ref 4.0–10.5)

## 2013-01-22 LAB — PROTIME-INR
INR: 1.58 — ABNORMAL HIGH (ref 0.00–1.49)
Prothrombin Time: 18.4 seconds — ABNORMAL HIGH (ref 11.6–15.2)

## 2013-01-22 MED ORDER — LISINOPRIL 20 MG PO TABS: 20.0000 mg | ORAL_TABLET | Freq: Every day | ORAL | Status: DC

## 2013-01-22 MED ORDER — FUROSEMIDE 40 MG PO TABS: 40.0000 mg | ORAL_TABLET | Freq: Every day | ORAL | Status: DC

## 2013-01-22 MED ORDER — CLONIDINE HCL 0.1 MG PO TABS: 0.1000 mg | ORAL_TABLET | Freq: Three times a day (TID) | ORAL | Status: DC

## 2013-01-22 MED ORDER — CLONIDINE HCL 0.1 MG PO TABS
0.1000 mg | ORAL_TABLET | Freq: Once | ORAL | Status: AC
  Administered 2013-01-22: 0.1 mg via ORAL
  Filled 2013-01-22: qty 1

## 2013-01-22 NOTE — Progress Notes (Signed)
   Subjective:    Patient ID: [name] Little, female    DOB: 09/07/1933, 79 y.o.   MRN: [id number]  HPI Mrs. Aina presents for follow up poorly controlled hypertension. Last night she had some pain in the back of her head and took her BP - it was 194/? Because of concern she went to ED. ED notes, labs and EKG reviewed. She was treated with clonidine 0.1 mg single dose  PMH, FamHx and SocHx reviewed for any changes and relevance. Current Outpatient Prescriptions on File Prior to Visit  Medication Sig Dispense Refill  . Omega-3 Fatty Acids (FISH OIL) 1000 MG CPDR Take 1 capsule by mouth daily.        . penicillin v potassium (VEETID) 500 MG tablet Take 1 tablet (500 mg total) by mouth 3 (three) times daily.  30 tablet  0   No current facility-administered medications on file prior to visit.      Review of Systems System review is negative for any constitutional, cardiac, pulmonary, GI or neuro symptoms or complaints other than as described in the HPI.     Objective:   Physical Exam Filed Vitals:   01/22/13 0835  BP: 152/80  Pulse: 79  Temp: 98 F (36.7 C)   BP Readings from Last 3 Encounters:  01/22/13 152/80  01/22/13 148/66  01/15/13 158/86   Gen'l - WNWD woman in no distress HEENT- PERRLA, Fundoscopic exam- no hemorrhage, no vascular changes Cor 2+ radial RRR Pulm - normal respirations Neuro - A&O x 3, normal gait and station Derm - malar rash and across nose       Assessment & Plan:

## 2013-01-22 NOTE — Progress Notes (Signed)
Pre visit review using our clinic review tool, if applicable. No additional management support is needed unless otherwise documented below in the visit note. 

## 2013-01-22 NOTE — ED Notes (Addendum)
Pt reports having an elevated BP x 1 week. Pt saw her PCP last Friday for the same, and is to follow up with him this AM (01/22/13). Pt reports headache yesterday evening, however she denies pain at this time. Pt takes lisinopril and hydrochlorothiazide for hypertension, and she took both medications on Tuesday morning.

## 2013-01-22 NOTE — ED Notes (Signed)
The pts bp has been high all day. Her bp is getting higher

## 2013-01-22 NOTE — Patient Instructions (Signed)
Blood pressure - we have lost the good control you have previously enjoyed. All the lab work at the ED was normal. It is not uncommon for blood pressure to be a progressive disease with a need for periodic adjustment of medication.  Plan Increase Lisinopril to 20 mg once a day  Stop the hydrochlorothiazide  Start Furosemide 40 mg once a day  For systolic blood pressure greater than 170 or diastolic blood pressure greater than 100 take Clonidine 0.1 mg. You may take this every 4 hours as needed.

## 2013-01-22 NOTE — Assessment & Plan Note (Addendum)
BP has been rising with excursions to SBP 170+. She had full ED evaluation 12/23-12/24/14  Plan Increase lisinopril to 20 mg  Change HCT to furosemide 40  Clonidine 0.1mg  q 4 prn SBP 170+, DBP 100+  BP follow-up

## 2013-01-22 NOTE — ED Provider Notes (Signed)
CSN: [id number]     Arrival date & time 01/22/13  0127 History   First MD Initiated Contact with Patient 01/22/13 0151     Chief Complaint  Patient presents with  . Hypertension   (Consider location/radiation/quality/duration/timing/severity/associated sxs/prior Treatment) HPI Patient presents with concerns that her blood pressure is elevated. She states she's been taking several times a day. She's had no recent change in her blood pressure medication. She admits to compliance. She is supposed to see her primary Dr. this morning reevaluation of her blood pressure. She took her blood pressure this evening and it was elevated and she had a mild posterior headache. She had no neck pain. She had no focal weakness or vision changes. She's been in the emergency department her blood pressure has improved and her symptoms are completely alleviated. Past Medical History  Diagnosis Date  . Personal history of colonic polyps     hyperplastic  . Hemorrhoids   . Thoracic aortic aneurysm   . Paroxysmal supraventricular tachycardia   . Factor V deficiency   . TMJ pain dysfunction syndrome   . DVT (deep venous thrombosis)   . Hyperlipemia   . Blood clotting disorder     V5 factor  . Clostridium difficile infection   . Hypertension   . Infiltrating ductal carcinoma of breast     stage one right   . Blood dyscrasia     factor 5 deficiency  . Arthritis     lumbar stenosis, radiculopathy, OA- L hip  . Clotting disorder    Past Surgical History  Procedure Laterality Date  . Total hip arthroplasty  1999    right  . Laparoscopic hysterectomy    . Splenectomy    . Right calf surgery      for cancer  . Colonoscopy    . Upper gastrointestinal endoscopy    . Joint replacement      R hip replacement   . Breast lumpectomy  2010    right Dr. Jamey Ripa  . Abdominal hysterectomy      1960- ? vaginal hysterectomy, not laparoscopic   . Eye surgery      bilateral cataracts- /w IOL   . Lumbar  laminectomy/decompression microdiscectomy  04/11/2011    Procedure: LUMBAR LAMINECTOMY/DECOMPRESSION MICRODISCECTOMY;  Surgeon: Barnett Abu, MD;  Location: [facility];  Service: Neurosurgery;  Laterality: N/A;  Lumbar Five-Sacral One Microdiscectomy   Family History  Problem Relation Age of Onset  . Heart disease Mother   . Heart disease Father   . Heart disease      uncles  . Clotting disorder Maternal Uncle   . Breast cancer Paternal Aunt   . Cancer Paternal Aunt     breast  . Colon cancer Neg Hx   . Anesthesia problems Neg Hx   . Hypotension Neg Hx   . Malignant hyperthermia Neg Hx   . Pseudochol deficiency Neg Hx    History  Substance Use Topics  . Smoking status: Never Smoker   . Smokeless tobacco: Never Used  . Alcohol Use: No   OB History   Grav Para Term Preterm Abortions TAB SAB Ect Mult Living                 Review of Systems  Constitutional: Negative for fever and chills.  Eyes: Negative for visual disturbance.  Respiratory: Negative for shortness of breath and wheezing.   Cardiovascular: Negative for chest pain.  Gastrointestinal: Negative for nausea, vomiting, abdominal pain and diarrhea.  Skin:  Negative for rash and wound.  Neurological: Positive for headaches. Negative for dizziness, syncope, weakness, light-headedness and numbness.  All other systems reviewed and are negative.    Allergies  Cefuroxime axetil and Sulfonamide derivatives  Home Medications   Current Outpatient Rx  Name  Route  Sig  Dispense  Refill  . hydrochlorothiazide (HYDRODIURIL) 25 MG tablet   Oral   Take 25 mg by mouth daily.         Marland Kitchen lisinopril (PRINIVIL,ZESTRIL) 10 MG tablet   Oral   Take 10 mg by mouth daily.         . Omega-3 Fatty Acids (FISH OIL) 1000 MG CPDR   Oral   Take 1 capsule by mouth daily.           . penicillin v potassium (VEETID) 500 MG tablet   Oral   Take 1 tablet (500 mg total) by mouth 3 (three) times daily.   30 tablet   0   .  warfarin (COUMADIN) 5 MG tablet   Oral   Take 2.5-5 mg by mouth daily. Take 1/2 tablet on Monday and Friday then take 1 tablet the other days          BP 176/76  Pulse 59  Temp(Src) 97.7 F (36.5 C) (Oral)  Resp 18  SpO2 97% Physical Exam  Nursing note and vitals reviewed. Constitutional: She is oriented to person, place, and time. She appears well-developed and well-nourished. No distress.  HENT:  Head: Normocephalic and atraumatic.  Mouth/Throat: Oropharynx is clear and moist. No oropharyngeal exudate.  Eyes: EOM are normal. Pupils are equal, round, and reactive to light.  Neck: Normal range of motion. Neck supple.  No meningismus  Cardiovascular: Normal rate and regular rhythm.   Pulmonary/Chest: Effort normal and breath sounds normal. No respiratory distress. She has no wheezes. She has no rales.  Abdominal: Soft. Bowel sounds are normal. She exhibits no distension and no mass. There is no tenderness. There is no rebound and no guarding.  Musculoskeletal: Normal range of motion. She exhibits no edema and no tenderness.  Neurological: She is alert and oriented to person, place, and time.  Patient is alert and oriented x3 with clear, goal oriented speech. Patient has 5/5 motor in all extremities. Sensation is intact to light touch. Bilateral finger-to-nose is normal with no signs of dysmetria. Patient has a normal gait and walks without assistance.   Skin: Skin is warm and dry. No rash noted. No erythema.  Psychiatric: She has a normal mood and affect. Her behavior is normal.    ED Course  Procedures (including critical care time) Labs Review Labs Reviewed  CBC WITH DIFFERENTIAL - Abnormal; Notable for the following:    Monocytes Relative 13 (*)    Monocytes Absolute 1.2 (*)    All other components within normal limits  COMPREHENSIVE METABOLIC PANEL - Abnormal; Notable for the following:    Glucose, Bld 110 (*)    GFR calc non Af Amer 79 (*)    All other components  within normal limits  PROTIME-INR - Abnormal; Notable for the following:    Prothrombin Time 18.4 (*)    INR 1.58 (*)    All other components within normal limits  URINALYSIS, ROUTINE W REFLEX MICROSCOPIC   Imaging Review No results found.  EKG Interpretation   None       MDM   1. Uncontrolled hypertension    Patient's blood pressure improved in the emergency department. She remains neurologically intact.  She is advised to followup with her primary doctor today for adjustment of medications as he sees fit. Return precautions have been given and the patient has voiced understanding.    Loren Racer, MD 01/24/13 [id number]

## 2013-01-22 NOTE — ED Notes (Signed)
[place], MD at bedside. 

## 2013-02-05 ENCOUNTER — Encounter (INDEPENDENT_AMBULATORY_CARE_PROVIDER_SITE_OTHER): Payer: Medicare Other | Admitting: Surgery

## 2013-02-07 ENCOUNTER — Other Ambulatory Visit: Payer: Self-pay | Admitting: Internal Medicine

## 2013-02-10 ENCOUNTER — Other Ambulatory Visit: Payer: Self-pay | Admitting: General Practice

## 2013-02-10 MED ORDER — WARFARIN SODIUM 5 MG PO TABS: ORAL_TABLET | ORAL | Status: DC

## 2013-02-13 ENCOUNTER — Ambulatory Visit (INDEPENDENT_AMBULATORY_CARE_PROVIDER_SITE_OTHER): Payer: Medicare Other | Admitting: General Practice

## 2013-02-13 DIAGNOSIS — Z86718 Personal history of other venous thrombosis and embolism: Secondary | ICD-10-CM

## 2013-02-13 DIAGNOSIS — D682 Hereditary deficiency of other clotting factors: Secondary | ICD-10-CM

## 2013-02-13 LAB — POCT INR: INR: 2.1

## 2013-02-13 NOTE — Progress Notes (Signed)
Pre-visit discussion using our clinic review tool. No additional management support is needed unless otherwise documented below in the visit note.  

## 2013-03-03 ENCOUNTER — Encounter (INDEPENDENT_AMBULATORY_CARE_PROVIDER_SITE_OTHER): Payer: Self-pay | Admitting: General Surgery

## 2013-03-03 ENCOUNTER — Other Ambulatory Visit (INDEPENDENT_AMBULATORY_CARE_PROVIDER_SITE_OTHER): Payer: Self-pay | Admitting: General Surgery

## 2013-03-03 ENCOUNTER — Encounter (INDEPENDENT_AMBULATORY_CARE_PROVIDER_SITE_OTHER): Payer: Self-pay

## 2013-03-03 ENCOUNTER — Ambulatory Visit (INDEPENDENT_AMBULATORY_CARE_PROVIDER_SITE_OTHER): Payer: Medicare Other | Admitting: General Surgery

## 2013-03-03 VITALS — BP 150/84 | HR 64 | Temp 98.5°F | Resp 14 | Ht 63.0 in | Wt 173.4 lb

## 2013-03-03 DIAGNOSIS — Z853 Personal history of malignant neoplasm of breast: Secondary | ICD-10-CM

## 2013-03-03 DIAGNOSIS — C50919 Malignant neoplasm of unspecified site of unspecified female breast: Secondary | ICD-10-CM

## 2013-03-03 DIAGNOSIS — L259 Unspecified contact dermatitis, unspecified cause: Secondary | ICD-10-CM

## 2013-03-03 NOTE — Patient Instructions (Signed)
We will let you know about pathology.  Otherwise, follow up with Dr. Jana Hakim this summer.    Continue yearly mammograms.

## 2013-03-03 NOTE — Assessment & Plan Note (Signed)
No evidence of breast mass. The small area of scabbing that continues or her on the nipple was biopsied with a 2 mm punch biopsy.  I have a low suspicion for recurrent breast cancer or for Paget's disease of the nipple.  If she does not have Paget's disease, I do not think she needs followup. She is now 5 years out from her breast cancer treatment. She has an appointment with Dr. Jana Hakim in the summer.

## 2013-03-03 NOTE — Progress Notes (Signed)
HISTORY: Patient is a 80 year old female who is 5 years out from breast conservation from her right-sided breast cancer. She has also been on anti-hormone treatment.  She has occasional right breast soreness, but no significant pain. She denies any nipple discharge. She is very small scab on her upper outer nipple that continues to recur. She states she intermittently will scrape it off and it will come back.  She has not had any other issues. He has no new health problems since Dr. [name] saw her last year.  PERTINENT REVIEW OF SYSTEMS: Leg swelling and easy bruising.    Filed Vitals:   03/03/13 1048  BP: 150/84  Pulse: 64  Temp: 98.5 F (36.9 C)  Resp: 14   Wt Readings from Last 3 Encounters:  03/03/13 173 lb 6.4 oz (78.654 kg)  01/22/13 165 lb 9.6 oz (75.116 kg)  01/15/13 166 lb (75.297 kg)    EXAM: Head: Normocephalic and atraumatic.  Eyes:  Conjunctivae are normal. Pupils are equal, round, and reactive to light. No scleral icterus.  Neck:  Normal range of motion. Neck supple. No tracheal deviation present. No thyromegaly present.  Resp: No respiratory distress, normal effort. Breast:  Right breast sl smaller than left.  No palpable masses.  No LAD.  2 mm scab at 11 o'clock at nipple/areolar border.   Abd:  Abdomen is soft, non distended and non tender. No masses are palpable.  There is no rebound and no guarding.  Neurological: Alert and oriented to person, place, and time. Coordination normal.  Skin: Skin is warm and dry. No rash noted. No diaphoretic. No erythema. No pallor.  Psychiatric: Normal mood and affect. Normal behavior. Judgment and thought content normal.      ASSESSMENT AND PLAN:   hx: breast cancer, IDC, right LOQ, receptor + her 2 - No evidence of breast mass. The small area of scabbing that continues or her on the nipple was biopsied with a 2 mm punch biopsy.  I have a low suspicion for recurrent breast cancer or for Paget's disease of the nipple.  If she  does not have Paget's disease, I do not think she needs followup. She is now 5 years out from her breast cancer treatment. She has an appointment with Dr. Jana Hakim in the summer.        Milus Height, MD Surgical Oncology, [facility]  Adella Hare, MD Linda Hedges Heinz Knuckles, MD

## 2013-03-06 ENCOUNTER — Telehealth (INDEPENDENT_AMBULATORY_CARE_PROVIDER_SITE_OTHER): Payer: Self-pay

## 2013-03-06 NOTE — Telephone Encounter (Signed)
Notified the patient her biopsy was negative for cancer.  Dermatitis.

## 2013-03-25 ENCOUNTER — Ambulatory Visit: Payer: Medicare Other | Admitting: Internal Medicine

## 2013-03-26 ENCOUNTER — Ambulatory Visit (INDEPENDENT_AMBULATORY_CARE_PROVIDER_SITE_OTHER): Payer: Medicare Other | Admitting: General Practice

## 2013-03-26 DIAGNOSIS — Z86718 Personal history of other venous thrombosis and embolism: Secondary | ICD-10-CM

## 2013-03-26 DIAGNOSIS — D682 Hereditary deficiency of other clotting factors: Secondary | ICD-10-CM

## 2013-03-26 DIAGNOSIS — Z5181 Encounter for therapeutic drug level monitoring: Secondary | ICD-10-CM | POA: Insufficient documentation

## 2013-03-26 LAB — POCT INR: INR: 2.4

## 2013-03-26 NOTE — Progress Notes (Signed)
Pre visit review using our clinic review tool, if applicable. No additional management support is needed unless otherwise documented below in the visit note. 

## 2013-04-01 ENCOUNTER — Encounter: Payer: Self-pay | Admitting: Internal Medicine

## 2013-04-01 ENCOUNTER — Other Ambulatory Visit (INDEPENDENT_AMBULATORY_CARE_PROVIDER_SITE_OTHER): Payer: BC Managed Care – PPO

## 2013-04-01 ENCOUNTER — Ambulatory Visit (INDEPENDENT_AMBULATORY_CARE_PROVIDER_SITE_OTHER): Payer: Medicare Other | Admitting: Internal Medicine

## 2013-04-01 VITALS — BP 154/90 | HR 70 | Temp 97.7°F | Wt 174.0 lb

## 2013-04-01 DIAGNOSIS — E785 Hyperlipidemia, unspecified: Secondary | ICD-10-CM

## 2013-04-01 DIAGNOSIS — I10 Essential (primary) hypertension: Secondary | ICD-10-CM

## 2013-04-01 DIAGNOSIS — R0989 Other specified symptoms and signs involving the circulatory and respiratory systems: Secondary | ICD-10-CM

## 2013-04-01 DIAGNOSIS — I712 Thoracic aortic aneurysm, without rupture, unspecified: Secondary | ICD-10-CM

## 2013-04-01 DIAGNOSIS — Z23 Encounter for immunization: Secondary | ICD-10-CM

## 2013-04-01 DIAGNOSIS — Z7901 Long term (current) use of anticoagulants: Secondary | ICD-10-CM

## 2013-04-01 DIAGNOSIS — D682 Hereditary deficiency of other clotting factors: Secondary | ICD-10-CM

## 2013-04-01 DIAGNOSIS — Z0000 Encounter for general adult medical examination without abnormal findings: Secondary | ICD-10-CM

## 2013-04-01 LAB — HEMOGLOBIN AND HEMATOCRIT, BLOOD
HCT: 44.2 % (ref 36.0–46.0)
Hemoglobin: 14.6 g/dL (ref 12.0–15.0)

## 2013-04-01 LAB — BASIC METABOLIC PANEL
BUN: 17 mg/dL (ref 6–23)
CO2: 28 mEq/L (ref 19–32)
Calcium: 9.4 mg/dL (ref 8.4–10.5)
Chloride: 105 mEq/L (ref 96–112)
Creatinine, Ser: 0.8 mg/dL (ref 0.4–1.2)
GFR: 69.82 mL/min (ref >60.00)
Glucose, Bld: 99 mg/dL (ref 70–99)
Potassium: 4.6 mEq/L (ref 3.5–5.1)
Sodium: 139 mEq/L (ref 135–145)

## 2013-04-01 LAB — LIPID PANEL
Cholesterol: 228 mg/dL — ABNORMAL HIGH (ref 0–200)
HDL: 38.9 mg/dL — ABNORMAL LOW (ref >39.00)
LDL Cholesterol: 150 mg/dL — ABNORMAL HIGH (ref 0–99)
Total CHOL/HDL Ratio: 6
Triglycerides: 195 mg/dL — ABNORMAL HIGH (ref 0.0–149.0)
VLDL: 39 mg/dL (ref 0.0–40.0)

## 2013-04-01 LAB — TSH: TSH: 4.18 u[IU]/mL (ref 0.35–5.50)

## 2013-04-01 MED ORDER — CLONIDINE HCL 0.1 MG PO TABS: 0.1000 mg | ORAL_TABLET | Freq: Three times a day (TID) | ORAL | Status: DC

## 2013-04-01 MED ORDER — AMLODIPINE BESYLATE 5 MG PO TABS: 5.0000 mg | ORAL_TABLET | Freq: Every day | ORAL | Status: DC

## 2013-04-01 NOTE — Progress Notes (Signed)
Pre visit review using our clinic review tool, if applicable. No additional management support is needed unless otherwise documented below in the visit note. 

## 2013-04-01 NOTE — Patient Instructions (Signed)
Thanks for coming to see me and for seeing me over the years.  Your exam today is fine. Will order routine lab including a lipid panel. Results will be mailed to you.  A carotid doppler is ordered to make sure there is no blockage - this will be done at [facility] on [address].  Blood pressure - borderline control today.  Plan Continue lisinopril  Stop Furosemide  Start Amlodine 5 mg once a day  Blood pressure check in 2-3 weeks  Health maintenance - will give tetanus booster and Prevnar pneumonia vaccine today. You are otherwise up to date, including colorectal cancer screening.

## 2013-04-01 NOTE — Progress Notes (Addendum)
Subjective:    Patient ID: [name] Little, female    DOB: 09/13/1933, 80 y.o.   MRN: [id number]  HPI The patient is here for annual Medicare wellness examination and management of other chronic and acute problems.  She reports that she is doing well medically but she has some family stress.    The risk factors are reflected in the social history.  The roster of all physicians providing medical care to patient - is listed in the Snapshot section of the chart.  Activities of daily living:  The patient is 100% inedpendent in all ADLs: dressing, toileting, feeding as well as independent mobility  Home safety : The patient has smoke detectors in the home. They wear seatbelts. firearms are present in the home, kept in a safe fashion. There is no violence in the home.   There is no risks for hepatitis, STDs or HIV. There is no   history of blood transfusion. They have no travel history to infectious disease endemic areas of the world.  The patient has seen their dentist in the last six month. They have seen their eye doctor in the last year. They deny any hearing difficulty and have not had audiologic testing in the last year.    They do not  have excessive sun exposure. Discussed the need for sun protection: hats, long sleeves and use of sunscreen if there is significant sun exposure.   Diet: the importance of a healthy diet is discussed. They do have a healthy (unhealthy-high fat/fast food) diet.  The patient has a regular exercise program: walking or stair climbing ,20 -30 min duration, 4 per week.  The benefits of regular aerobic exercise were discussed.  Depression screen: there are no signs or vegative symptoms of depression- irritability, change in appetite, anhedonia, sadness/tearfullness.  Cognitive assessment: the patient manages all their financial and personal affairs and is actively engaged.   The following portions of the patient's history were reviewed and updated as  appropriate: allergies, current medications, past family history, past medical history,  past surgical history, past social history  and problem list.  Vision, hearing, body mass index were assessed and reviewed.   During the course of the visit the patient was educated and counseled about appropriate screening and preventive services including : fall prevention , diabetes screening, nutrition counseling, colorectal cancer screening, and recommended immunizations.  Past Medical History  Diagnosis Date  . Personal history of colonic polyps     hyperplastic  . Hemorrhoids   . Thoracic aortic aneurysm   . Paroxysmal supraventricular tachycardia   . Factor V deficiency   . TMJ pain dysfunction syndrome   . DVT (deep venous thrombosis)   . Hyperlipemia   . Blood clotting disorder     V5 factor  . Clostridium difficile infection   . Hypertension   . Infiltrating ductal carcinoma of breast     stage one right   . Blood dyscrasia     factor 5 deficiency  . Arthritis     lumbar stenosis, radiculopathy, OA- L hip  . Clotting disorder    Past Surgical History  Procedure Laterality Date  . Total hip arthroplasty  1999    right  . Laparoscopic hysterectomy    . Splenectomy    . Right calf surgery      for cancer  . Colonoscopy    . Upper gastrointestinal endoscopy    . Joint replacement      R hip replacement   .  Breast lumpectomy  2010    right Dr. [name]  . Abdominal hysterectomy      1960- ? vaginal hysterectomy, not laparoscopic   . Eye surgery      bilateral cataracts- /w IOL   . Lumbar laminectomy/decompression microdiscectomy  04/11/2011    Procedure: LUMBAR LAMINECTOMY/DECOMPRESSION MICRODISCECTOMY;  Surgeon: [name], MD;  Location: [facility];  Service: Neurosurgery;  Laterality: N/A;  Lumbar Five-Sacral One Microdiscectomy   Family History  Problem Relation Age of Onset  . Heart disease Mother   . Heart disease Father   . Heart disease      uncles  .  Clotting disorder Maternal Uncle   . Breast cancer Paternal Aunt   . Cancer Paternal Aunt     breast  . Colon cancer Neg Hx   . Anesthesia problems Neg Hx   . Hypotension Neg Hx   . Malignant hyperthermia Neg Hx   . Pseudochol deficiency Neg Hx    History   Social History  . Marital Status: Married    Spouse Name: N/A    Number of Children: 4  . Years of Education: N/A   Occupational History  . Retired    Social History Main Topics  . Smoking status: Never Smoker   . Smokeless tobacco: Never Used  . Alcohol Use: No  . Drug Use: No  . Sexual Activity: Not on file   Other Topics Concern  . Not on file   Social History Narrative   Married 1955   2 sons - '59, '61, 2 daughters '55, '57; 8 grandchildren; 1 great grand   I-ADLs          Current Outpatient Prescriptions on File Prior to Visit  Medication Sig Dispense Refill  . furosemide (LASIX) 40 MG tablet Take 1 tablet (40 mg total) by mouth daily.  30 tablet  3  . lisinopril (PRINIVIL,ZESTRIL) 20 MG tablet Take 1 tablet (20 mg total) by mouth daily.  30 tablet  11  . Omega-3 Fatty Acids (FISH OIL) 1000 MG CPDR Take 1 capsule by mouth daily.        Marland Kitchen warfarin (COUMADIN) 5 MG tablet Take as directed by anticoagulation clinic  30 tablet  3   No current facility-administered medications on file prior to visit.     Review of Systems Constitutional:  Negative for fever, chills, activity change and unexpected weight change.  HEENT:  Negative for hearing loss, ear pain, congestion, neck stiffness and postnasal drip. Negative for sore throat or swallowing problems. Negative for dental complaints.   Eyes: Negative for vision loss or change in visual acuity.  Respiratory: Negative for chest tightness and wheezing. Negative for DOE.   Cardiovascular: Negative for chest pain or palpitations. No decreased exercise tolerance Gastrointestinal: No change in bowel habit. No bloating or gas. No reflux or indigestion Genitourinary:  Negative for urgency, frequency, flank pain and difficulty urinating.  Musculoskeletal: Negative for myalgias, back pain, arthralgias and gait problem.  Neurological: Negative for dizziness, tremors, weakness and headaches.  Hematological: Negative for adenopathy.  Psychiatric/Behavioral: Negative for behavioral problems and dysphoric mood.       Objective:   Physical Exam Filed Vitals:   04/01/13 0850  BP: 154/90  Pulse: 70  Temp: 97.7 F (36.5 C)   Wt Readings from Last 3 Encounters:  04/01/13 174 lb (78.926 kg)  03/03/13 173 lb 6.4 oz (78.654 kg)  01/22/13 165 lb 9.6 oz (75.116 kg)   Gen'l: well nourished, well developed  Woman in no distress HEENT - [place]/AT, EACs/TMs normal, oropharynx with native dentition in good condition, no buccal or palatal lesions, posterior pharynx clear, mucous membranes moist. C&S clear, PERRLA, fundi - normal Neck - supple, no thyromegaly Nodes- negative submental, cervical, supraclavicular regions Chest - no deformity, no CVAT Lungs - clear without rales, wheezes. No increased work of breathing Breast - deferred to mammography Cardiovascular - regular rate and rhythm, quiet precordium, no murmurs, rubs or gallops, 2+ radial, DP and PT pulses, grade 1 left carotid bruit - intermittent. Abdomen - BS+ x 4, no HSM, no guarding or rebound or tenderness Pelvic - deferred to gyn Rectal - deferred to gyn Extremities - no clubbing, cyanosis, edema or deformity.  Neuro - A&O x 3, CN II-XII normal, motor strength normal and equal, DTRs 2+ and symmetrical biceps, radial, and patellar tendons. Cerebellar - no tremor, no rigidity, fluid movement and normal gait. Derm - Head, neck, back, abdomen and extremities without suspicious lesions  Recent Results (from the past 2160 hour(s))  POCT INR     Status: None   Collection Time    01/15/13  4:02 PM      Result Value Ref Range   INR 2.0    CBC WITH DIFFERENTIAL     Status: Abnormal   Collection Time     01/22/13  2:19 AM      Result Value Ref Range   WBC 9.7  4.0 - 10.5 K/uL   RBC 4.68  3.87 - 5.11 MIL/uL   Hemoglobin 14.4  12.0 - 15.0 g/dL   HCT 42.3  36.0 - 46.0 %   MCV 90.4  78.0 - 100.0 fL   MCH 30.8  26.0 - 34.0 pg   MCHC 34.0  30.0 - 36.0 g/dL   RDW 14.6  11.5 - 15.5 %   Platelets 374  150 - 400 K/uL   Neutrophils Relative % 45  43 - 77 %   Neutro Abs 4.4  1.7 - 7.7 K/uL   Lymphocytes Relative 39  12 - 46 %   Lymphs Abs 3.8  0.7 - 4.0 K/uL   Monocytes Relative 13 (*) 3 - 12 %   Monocytes Absolute 1.2 (*) 0.1 - 1.0 K/uL   Eosinophils Relative 3  0 - 5 %   Eosinophils Absolute 0.3  0.0 - 0.7 K/uL   Basophils Relative 1  0 - 1 %   Basophils Absolute 0.1  0.0 - 0.1 K/uL  COMPREHENSIVE METABOLIC PANEL     Status: Abnormal   Collection Time    01/22/13  2:19 AM      Result Value Ref Range   Sodium 139  135 - 145 mEq/L   Potassium 3.5  3.5 - 5.1 mEq/L   Chloride 100  96 - 112 mEq/L   CO2 28  19 - 32 mEq/L   Glucose, Bld 110 (*) 70 - 99 mg/dL   BUN 20  6 - 23 mg/dL   Creatinine, Ser 0.78  0.50 - 1.10 mg/dL   Calcium 9.4  8.4 - 10.5 mg/dL   Total Protein 7.7  6.0 - 8.3 g/dL   Albumin 3.8  3.5 - 5.2 g/dL   AST 20  0 - 37 U/L   ALT 12  0 - 35 U/L   Alkaline Phosphatase 70  39 - 117 U/L   Total Bilirubin 0.3  0.3 - 1.2 mg/dL   GFR calc non Af Amer 79 (*) >90 mL/min   GFR calc  Af Amer >90  >90 mL/min   Comment: (NOTE)     The eGFR has been calculated using the CKD EPI equation.     This calculation has not been validated in all clinical situations.     eGFR's persistently <90 mL/min signify possible Chronic Kidney     Disease.  PROTIME-INR     Status: Abnormal   Collection Time    01/22/13  2:19 AM      Result Value Ref Range   Prothrombin Time 18.4 (*) 11.6 - 15.2 seconds   INR 1.58 (*) 0.00 - 1.49  URINALYSIS, ROUTINE W REFLEX MICROSCOPIC     Status: None   Collection Time    01/22/13  2:54 AM      Result Value Ref Range   Color, Urine YELLOW  YELLOW   APPearance  CLEAR  CLEAR   Specific Gravity, Urine 1.009  1.005 - 1.030   pH 7.0  5.0 - 8.0   Glucose, UA NEGATIVE  NEGATIVE mg/dL   Hgb urine dipstick NEGATIVE  NEGATIVE   Bilirubin Urine NEGATIVE  NEGATIVE   Ketones, ur NEGATIVE  NEGATIVE mg/dL   Protein, ur NEGATIVE  NEGATIVE mg/dL   Urobilinogen, UA 0.2  0.0 - 1.0 mg/dL   Nitrite NEGATIVE  NEGATIVE   Leukocytes, UA NEGATIVE  NEGATIVE   Comment: MICROSCOPIC NOT DONE ON URINES WITH NEGATIVE PROTEIN, BLOOD, LEUKOCYTES, NITRITE, OR GLUCOSE <1000 mg/dL.  POCT INR     Status: None   Collection Time    02/13/13  8:26 AM      Result Value Ref Range   INR 2.1    POCT INR     Status: None   Collection Time    03/26/13  3:24 PM      Result Value Ref Range   INR 2.4    TSH     Status: None   Collection Time    04/01/13  9:51 AM      Result Value Ref Range   TSH 4.18  0.35 - 5.50 uIU/mL  BASIC METABOLIC PANEL     Status: None   Collection Time    04/01/13  9:51 AM      Result Value Ref Range   Sodium 139  135 - 145 mEq/L   Potassium 4.6  3.5 - 5.1 mEq/L   Chloride 105  96 - 112 mEq/L   CO2 28  19 - 32 mEq/L   Glucose, Bld 99  70 - 99 mg/dL   BUN 17  6 - 23 mg/dL   Creatinine, Ser 0.8  0.4 - 1.2 mg/dL   Calcium 9.4  8.4 - 10.5 mg/dL   GFR 69.82  >60.00 mL/min  LIPID PANEL     Status: Abnormal   Collection Time    04/01/13  9:51 AM      Result Value Ref Range   Cholesterol 228 (*) 0 - 200 mg/dL   Comment: ATP III Classification       Desirable:  < 200 mg/dL               Borderline High:  200 - 239 mg/dL          High:  > = 240 mg/dL   Triglycerides 195.0 (*) 0.0 - 149.0 mg/dL   Comment: Normal:  <150 mg/dLBorderline High:  150 - 199 mg/dL   HDL 38.90 (*) >39.00 mg/dL   VLDL 39.0  0.0 - 40.0 mg/dL   LDL Cholesterol 150 (*) 0 -  99 mg/dL   Total CHOL/HDL Ratio 6     Comment:                Men          Women1/2 Average Risk     3.4          3.3Average Risk          5.0          4.42X Average Risk          9.6          7.13X Average Risk           15.0          11.0                      HEMOGLOBIN AND HEMATOCRIT, BLOOD     Status: None   Collection Time    04/01/13  9:51 AM      Result Value Ref Range   Hemoglobin 14.6  12.0 - 15.0 g/dL   HCT 44.2  36.0 - 46.0 %          Assessment & Plan:

## 2013-04-02 ENCOUNTER — Ambulatory Visit (HOSPITAL_COMMUNITY): Payer: Medicare Other | Attending: Internal Medicine

## 2013-04-02 ENCOUNTER — Telehealth: Payer: Self-pay

## 2013-04-02 ENCOUNTER — Other Ambulatory Visit (HOSPITAL_COMMUNITY): Payer: Self-pay | Admitting: *Deleted

## 2013-04-02 DIAGNOSIS — R0989 Other specified symptoms and signs involving the circulatory and respiratory systems: Secondary | ICD-10-CM | POA: Insufficient documentation

## 2013-04-02 DIAGNOSIS — Z0000 Encounter for general adult medical examination without abnormal findings: Secondary | ICD-10-CM | POA: Insufficient documentation

## 2013-04-02 NOTE — Addendum Note (Signed)
Addended by: Neena Rhymes on: 04/02/2013 04:30 PM   Modules accepted: Orders

## 2013-04-02 NOTE — Assessment & Plan Note (Signed)
Continues on anticoagulation w/o complications

## 2013-04-02 NOTE — Telephone Encounter (Signed)
[name] Little called back and I advised the diagnosis Dr Linda Hedges states. She states left carotid bruit will work fine.

## 2013-04-02 NOTE — Assessment & Plan Note (Signed)
Last lab: LDL 150, HDL 38. [name] is opposed to using stating medications Per NCEP guidelines she is below the treatment threshold of an LDL > 160.   Plan Continue low fat diet and exercise  Consider non-statin therapy, e.g. Fibrate. Will discuss at next visit.

## 2013-04-02 NOTE — Assessment & Plan Note (Signed)
BP Readings from Last 3 Encounters:  04/01/13 154/90  03/03/13 150/84  01/22/13 152/80   Suboptimal control of BP on ACE-I + diuretic.  Plan Continue present medications  Add CCB - amlodipine 5 mg. She is aware of potential for mild peripheral edema at onset of treatment  F/u BP monitorig

## 2013-04-02 NOTE — Assessment & Plan Note (Signed)
No recent imaging study. She has been working on risk reduction.

## 2013-04-02 NOTE — Telephone Encounter (Signed)
Phone call from [facility] with Vascular lab at [place] stating diagnosis of hyperlipidemia is not billable. I check with Dr Linda Hedges and he states to use a diagnosis of factor 5 deficiency and left carotid bruit. I tried to call Olivia Mackie back at the # she left [phone] but it keeps ringing, no answer. Attempted to look up other numbers without success. Will just wait on her call back.

## 2013-04-02 NOTE — Assessment & Plan Note (Signed)
Interval history is normal except for elevated BP. Limited physical exam is normal. Labs reveiwed - elevated LDL, normal chemistries. She is current with colorectal cancer screening and mammography. Immunizations - given Prevnar, had Pneumovax, current with tetanus, needs Zostavax.  In summary - a nice woman who appears to be medically stable at this time.

## 2013-04-04 ENCOUNTER — Encounter: Payer: Self-pay | Admitting: Internal Medicine

## 2013-04-04 ENCOUNTER — Telehealth: Payer: Self-pay | Admitting: *Deleted

## 2013-04-04 ENCOUNTER — Other Ambulatory Visit: Payer: Self-pay | Admitting: Internal Medicine

## 2013-04-04 DIAGNOSIS — E785 Hyperlipidemia, unspecified: Secondary | ICD-10-CM

## 2013-04-04 NOTE — Telephone Encounter (Signed)
Patient phoned stating that her blood pressure was being "crazy" since starting the 04/01/13 prescribed Norvasc.  Provided the following blood pressures   Prior to taking med for the first time yesterday morning : 151/70                166/68 (3pm)                                                                                             138/66 (6pm)                186/73 (10pm) took clonidine prn   Prior to taking med this morning:  147/72 Further states her varicose veins are hurting & swollen (thinks med is the cause).  Consulted with Dr. Linda Hedges, he wants pt to continue presently prescribed meds as ordered, that the sxs she is c/o are coincidental and not r/t norvasc and to give it a few days.  Relayed MD response to patient who verbalized understanding, however hesitation as well-she's "afraid" of the medicine.  Advised pt that if her sxs continued to call us back.

## 2013-04-07 ENCOUNTER — Telehealth: Payer: Self-pay | Admitting: *Deleted

## 2013-04-07 NOTE — Telephone Encounter (Signed)
Add on to tomorrows schedule

## 2013-04-07 NOTE — Telephone Encounter (Signed)
Miss Izora Gala scheduling OV

## 2013-04-07 NOTE — Telephone Encounter (Signed)
Patient phoned again, stating that her previously phoned about sxs have continued to worsen while taken the Norvasc-bilateral pedal edema- can't get on her shoes and varicose veins are painful.  As per previous conversation and MD advice, patient resumed taking Norvasc (after stopping after only one dose when sxs started).    Please advise as patient is VERY uncomfortable.  OV f/u?  Also, clarification needed for her clonidine-MAR states TID, but she thought it was TID as needed for SBP >180  CB# [phone]

## 2013-04-08 ENCOUNTER — Ambulatory Visit (INDEPENDENT_AMBULATORY_CARE_PROVIDER_SITE_OTHER): Payer: Medicare Other | Admitting: Internal Medicine

## 2013-04-08 ENCOUNTER — Other Ambulatory Visit: Payer: Self-pay | Admitting: General Practice

## 2013-04-08 ENCOUNTER — Encounter (HOSPITAL_COMMUNITY): Payer: Medicare Other

## 2013-04-08 ENCOUNTER — Other Ambulatory Visit: Payer: Self-pay | Admitting: Cardiology

## 2013-04-08 ENCOUNTER — Encounter: Payer: Self-pay | Admitting: Internal Medicine

## 2013-04-08 VITALS — BP 150/78 | HR 56 | Temp 97.1°F | Resp 14 | Ht 63.0 in | Wt 174.5 lb

## 2013-04-08 DIAGNOSIS — I10 Essential (primary) hypertension: Secondary | ICD-10-CM

## 2013-04-08 MED ORDER — WARFARIN SODIUM 5 MG PO TABS: ORAL_TABLET | ORAL | Status: DC

## 2013-04-08 MED ORDER — LISINOPRIL 40 MG PO TABS: 40.0000 mg | ORAL_TABLET | Freq: Every day | ORAL | Status: DC

## 2013-04-08 NOTE — Assessment & Plan Note (Signed)
Blood pressure - still running a little high. The amlodipine seems to have too many drawbacks: swelling, hip pain, irritation of varicose veins.  Plan Stop the amlodipine  Increase the lisinopril to 40 mg once a day  Continue the clonidine 1 tablet 3 times a day.  Let me know how your blood pressure does over the next week.  

## 2013-04-08 NOTE — Progress Notes (Signed)
   Subjective:    Patient ID: [name] Little, female    DOB: 27-Oct-1933, 81 y.o.   MRN: [id number]  HPI [name] Little presents due to complications associated with amlodipine - LE edema to the knees, irritation of varicose veins, hip pain and lack of good control. She has not tolerated diuretics due to urinary frequency, she is bradycardic and not a candidate for BB.   PMH, FamHx and SocHx reviewed for any changes and relevance.  Current Outpatient Prescriptions on File Prior to Visit  Medication Sig Dispense Refill  . cloNIDine (CATAPRES) 0.1 MG tablet Take 1 tablet (0.1 mg total) by mouth 3 (three) times daily.  90 tablet  3  . Omega-3 Fatty Acids (FISH OIL) 1000 MG CPDR Take 1 capsule by mouth daily.        Marland Kitchen warfarin (COUMADIN) 5 MG tablet Take as directed by anticoagulation clinic  30 tablet  3   No current facility-administered medications on file prior to visit.     Review of Systems System review is negative for any constitutional, cardiac, pulmonary, GI or neuro symptoms or complaints other than as described in the HPI.     Objective:   Physical Exam Filed Vitals:   04/08/13 1109  BP: 150/78  Pulse: 56  Temp: 97.1 F (36.2 C)  Resp: 14   BP Readings from Last 3 Encounters:  04/08/13 150/78  04/01/13 154/90  03/03/13 150/84   Gen'l- WNWD woman in no distress Cor - RRR, 2+ pitting edema bilateral LE to the knee, increased varicose veins left knee.       Assessment & Plan:

## 2013-04-08 NOTE — Progress Notes (Signed)
Pre visit review using our clinic review tool, if applicable. No additional management support is needed unless otherwise documented below in the visit note. 

## 2013-04-08 NOTE — Patient Instructions (Signed)
Blood pressure - still running a little high. The amlodipine seems to have too many drawbacks: swelling, hip pain, irritation of varicose veins.  Plan Stop the amlodipine  Increase the lisinopril to 40 mg once a day  Continue the clonidine 1 tablet 3 times a day.  Let me know how your blood pressure does over the next week.

## 2013-04-14 ENCOUNTER — Emergency Department (HOSPITAL_COMMUNITY)
Admission: EM | Admit: 2013-04-14 | Discharge: 2013-04-14 | Disposition: A | Discharge status: Home / Self Care | Payer: Medicare Other | Attending: Emergency Medicine | Admitting: Emergency Medicine

## 2013-04-14 DIAGNOSIS — R600 Localized edema: Secondary | ICD-10-CM

## 2013-04-14 DIAGNOSIS — Z862 Personal history of diseases of the blood and blood-forming organs and certain disorders involving the immune mechanism: Secondary | ICD-10-CM | POA: Insufficient documentation

## 2013-04-14 DIAGNOSIS — Z853 Personal history of malignant neoplasm of breast: Secondary | ICD-10-CM | POA: Insufficient documentation

## 2013-04-14 DIAGNOSIS — M25519 Pain in unspecified shoulder: Secondary | ICD-10-CM | POA: Insufficient documentation

## 2013-04-14 DIAGNOSIS — I471 Supraventricular tachycardia, unspecified: Secondary | ICD-10-CM | POA: Insufficient documentation

## 2013-04-14 DIAGNOSIS — Z8639 Personal history of other endocrine, nutritional and metabolic disease: Secondary | ICD-10-CM | POA: Insufficient documentation

## 2013-04-14 DIAGNOSIS — Z860100 Personal history of colon polyps, unspecified: Secondary | ICD-10-CM | POA: Insufficient documentation

## 2013-04-14 DIAGNOSIS — Z86718 Personal history of other venous thrombosis and embolism: Secondary | ICD-10-CM | POA: Insufficient documentation

## 2013-04-14 DIAGNOSIS — Z79899 Other long term (current) drug therapy: Secondary | ICD-10-CM | POA: Insufficient documentation

## 2013-04-14 DIAGNOSIS — Z8619 Personal history of other infectious and parasitic diseases: Secondary | ICD-10-CM | POA: Insufficient documentation

## 2013-04-14 DIAGNOSIS — Z7901 Long term (current) use of anticoagulants: Secondary | ICD-10-CM | POA: Insufficient documentation

## 2013-04-14 DIAGNOSIS — M129 Arthropathy, unspecified: Secondary | ICD-10-CM | POA: Insufficient documentation

## 2013-04-14 DIAGNOSIS — R609 Edema, unspecified: Secondary | ICD-10-CM | POA: Insufficient documentation

## 2013-04-14 DIAGNOSIS — D682 Hereditary deficiency of other clotting factors: Secondary | ICD-10-CM | POA: Insufficient documentation

## 2013-04-14 DIAGNOSIS — I10 Essential (primary) hypertension: Secondary | ICD-10-CM | POA: Insufficient documentation

## 2013-04-14 DIAGNOSIS — Z8719 Personal history of other diseases of the digestive system: Secondary | ICD-10-CM | POA: Insufficient documentation

## 2013-04-14 LAB — PRO B NATRIURETIC PEPTIDE: Pro B Natriuretic peptide (BNP): 174.9 pg/mL (ref 0–450)

## 2013-04-14 LAB — BASIC METABOLIC PANEL
BUN: 19 mg/dL (ref 6–23)
CO2: 27 mEq/L (ref 19–32)
Calcium: 9.2 mg/dL (ref 8.4–10.5)
Chloride: 104 mEq/L (ref 96–112)
Creatinine, Ser: 0.8 mg/dL (ref 0.50–1.10)
GFR calc Af Amer: 80 mL/min — ABNORMAL LOW (ref >90)
GFR calc non Af Amer: 69 mL/min — ABNORMAL LOW (ref >90)
Glucose, Bld: 116 mg/dL — ABNORMAL HIGH (ref 70–99)
Potassium: 4.1 mEq/L (ref 3.7–5.3)
Sodium: 142 mEq/L (ref 137–147)

## 2013-04-14 LAB — CBC
HCT: 38.5 % (ref 36.0–46.0)
Hemoglobin: 12.8 g/dL (ref 12.0–15.0)
MCH: 29.8 pg (ref 26.0–34.0)
MCHC: 33.2 g/dL (ref 30.0–36.0)
MCV: 89.5 fL (ref 78.0–100.0)
Platelets: 332 10*3/uL (ref 150–400)
RBC: 4.3 MIL/uL (ref 3.87–5.11)
RDW: 15.1 % (ref 11.5–15.5)
WBC: 7.1 10*3/uL (ref 4.0–10.5)

## 2013-04-14 LAB — TROPONIN I: Troponin I: <0.3 ng/mL (ref <0.30)

## 2013-04-14 MED ORDER — FUROSEMIDE 20 MG PO TABS: 20.0000 mg | ORAL_TABLET | Freq: Two times a day (BID) | ORAL | Status: DC

## 2013-04-14 NOTE — ED Notes (Signed)
Pt coming from home with c/o hypertension. Last BP at home was 192/75. Pt reports taking 0.1 mg clonidine at 0100. Pt denies headache, blurry vision, seeing spots, unsteady gait, or one sided weakness. Pt reports mild pain in between shoulder blades. Onset of pain was around 0145. Pt is A&Ox4, respirations equal and unlabored, skin warm and dry.

## 2013-04-14 NOTE — ED Notes (Signed)
EKG given to Dr. Mariane Masters.

## 2013-04-14 NOTE — Discharge Instructions (Signed)
We saw you in the ER for the elevated blood pressure All the results in the ER are normal, labs and imaging.  The workup in the ER is not complete, and is limited to screening for life threatening and emergent conditions only, so please see a primary care doctor for further evaluation.   Edema Edema is an abnormal build-up of fluids in tissues. Because this is partly dependent on gravity (water flows to the lowest place), it is more common in the legs and thighs (lower extremities). It is also common in the looser tissues, like around the eyes. Painless swelling of the feet and ankles is common and increases as a person ages. It may affect both legs and may include the calves or even thighs. When squeezed, the fluid may move out of the affected area and may leave a dent for a few moments. CAUSES   Prolonged standing or sitting in one place for extended periods of time. Movement helps pump tissue fluid into the veins, and absence of movement prevents this, resulting in edema.  Varicose veins. The valves in the veins do not work as well as they should. This causes fluid to leak into the tissues.  Fluid and salt overload.  Injury, burn, or surgery to the leg, ankle, or foot, may damage veins and allow fluid to leak out.  Sunburn damages vessels. Leaky vessels allow fluid to go out into the sunburned tissues.  Allergies (from insect bites or stings, medications or chemicals) cause swelling by allowing vessels to become leaky.  Protein in the blood helps keep fluid in your vessels. Low protein, as in malnutrition, allows fluid to leak out.  Hormonal changes, including pregnancy and menstruation, cause fluid retention. This fluid may leak out of vessels and cause edema.  Medications that cause fluid retention. Examples are sex hormones, blood pressure medications, steroid treatment, or anti-depressants.  Some illnesses cause edema, especially heart failure, kidney disease, or liver  disease.  Surgery that cuts veins or lymph nodes, such as surgery done for the heart or for breast cancer, may result in edema. DIAGNOSIS  Your caregiver is usually easily able to determine what is causing your swelling (edema) by simply asking what is wrong (getting a history) and examining you (doing a physical). Sometimes x-rays, EKG (electrocardiogram or heart tracing), and blood work may be done to evaluate for underlying medical illness. TREATMENT  General treatment includes:  Leg elevation (or elevation of the affected body part).  Restriction of fluid intake.  Prevention of fluid overload.  Compression of the affected body part. Compression with elastic bandages or support stockings squeezes the tissues, preventing fluid from entering and forcing it back into the blood vessels.  Diuretics (also called water pills or fluid pills) pull fluid out of your body in the form of increased urination. These are effective in reducing the swelling, but can have side effects and must be used only under your caregiver's supervision. Diuretics are appropriate only for some types of edema. The specific treatment can be directed at any underlying causes discovered. Heart, liver, or kidney disease should be treated appropriately. HOME CARE INSTRUCTIONS   Elevate the legs (or affected body part) above the level of the heart, while lying down.  Avoid sitting or standing still for prolonged periods of time.  Avoid putting anything directly under the knees when lying down, and do not wear constricting clothing or garters on the upper legs.  Exercising the legs causes the fluid to work back into the  veins and lymphatic channels. This may help the swelling go down.  The pressure applied by elastic bandages or support stockings can help reduce ankle swelling.  A low-salt diet may help reduce fluid retention and decrease the ankle swelling.  Take any medications exactly as prescribed. SEEK MEDICAL  CARE IF:  Your edema is not responding to recommended treatments. SEEK IMMEDIATE MEDICAL CARE IF:   You develop shortness of breath or chest pain.  You cannot breathe when you lay down; or if, while lying down, you have to get up and go to the window to get your breath.  You are having increasing swelling without relief from treatment.  You develop a fever over 102 F (38.9 C).  You develop pain or redness in the areas that are swollen.  Tell your caregiver right away if you have gained 03 lb/1.4 kg in 1 day or 05 lb/2.3 kg in a week. MAKE SURE YOU:   Understand these instructions.  Will watch your condition.  Will get help right away if you are not doing well or get worse. Document Released: 01/16/2005 Document Revised: 07/18/2011 Document Reviewed: 09/04/2007 [facility] Patient Information 2014 [place].  Arterial Hypertension Arterial hypertension (high blood pressure) is a condition of elevated pressure in your blood vessels. Hypertension over a long period of time is a risk factor for strokes, heart attacks, and heart failure. It is also the leading cause of kidney (renal) failure.  CAUSES   In Adults -- Over 90% of all hypertension has no known cause. This is called essential or primary hypertension. In the other 10% of people with hypertension, the increase in blood pressure is caused by another disorder. This is called secondary hypertension. Important causes of secondary hypertension are:  Heavy alcohol use.  Obstructive sleep apnea.  Hyperaldosterosim (Conn's syndrome).  Steroid use.  Chronic kidney failure.  Hyperparathyroidism.  Medications.  Renal artery stenosis.  Pheochromocytoma.  Cushing's disease.  Coarctation of the aorta.  Scleroderma renal crisis.  Licorice (in excessive amounts).  Drugs (cocaine, methamphetamine). Your caregiver can explain any items above that apply to you.  In Children -- Secondary hypertension is more common  and should always be considered.  Pregnancy -- Few women of childbearing age have high blood pressure. However, up to 10% of them develop hypertension of pregnancy. Generally, this will not harm the woman. It may be a sign of 3 complications of pregnancy: preeclampsia, HELLP syndrome, and eclampsia. Follow up and control with medication is necessary. SYMPTOMS   This condition normally does not produce any noticeable symptoms. It is usually found during a routine exam.  Malignant hypertension is a late problem of high blood pressure. It may have the following symptoms:  Headaches.  Blurred vision.  End-organ damage (this means your kidneys, heart, lungs, and other organs are being damaged).  Stressful situations can increase the blood pressure. If a person with normal blood pressure has their blood pressure go up while being seen by their caregiver, this is often termed "white coat hypertension." Its importance is not known. It may be related with eventually developing hypertension or complications of hypertension.  Hypertension is often confused with mental tension, stress, and anxiety. DIAGNOSIS  The diagnosis is made by 3 separate blood pressure measurements. They are taken at least 1 week apart from each other. If there is organ damage from hypertension, the diagnosis may be made without repeat measurements. Hypertension is usually identified by having blood pressure readings:  Above 140/90 mmHg measured in both  arms, at 3 separate times, over a couple weeks.  Over 130/80 mmHg should be considered a risk factor and may require treatment in patients with diabetes. Blood pressure readings over 120/80 mmHg are called "pre-hypertension" even in non-diabetic patients. To get a true blood pressure measurement, use the following guidelines. Be aware of the factors that can alter blood pressure readings.  Take measurements at least 1 hour after caffeine.  Take measurements 30 minutes after  smoking and without any stress. This is another reason to quit smoking  it raises your blood pressure.  Use a proper cuff size. Ask your caregiver if you are not sure about your cuff size.  Most home blood pressure cuffs are automatic. They will measure systolic and diastolic pressures. The systolic pressure is the pressure reading at the start of sounds. Diastolic pressure is the pressure at which the sounds disappear. If you are elderly, measure pressures in multiple postures. Try sitting, lying or standing.  Sit at rest for a minimum of 5 minutes before taking measurements.  You should not be on any medications like decongestants. These are found in many cold medications.  Record your blood pressure readings and review them with your caregiver. If you have hypertension:  Your caregiver may do tests to be sure you do not have secondary hypertension (see "causes" above).  Your caregiver may also look for signs of metabolic syndrome. This is also called Syndrome X or Insulin Resistance Syndrome. You may have this syndrome if you have type 2 diabetes, abdominal obesity, and abnormal blood lipids in addition to hypertension.  Your caregiver will take your medical and family history and perform a physical exam.  Diagnostic tests may include blood tests (for glucose, cholesterol, potassium, and kidney function), a urinalysis, or an EKG. Other tests may also be necessary depending on your condition. PREVENTION  There are important lifestyle issues that you can adopt to reduce your chance of developing hypertension:  Maintain a normal weight.  Limit the amount of salt (sodium) in your diet.  Exercise often.  Limit alcohol intake.  Get enough potassium in your diet. Discuss specific advice with your caregiver.  Follow a DASH diet (dietary approaches to stop hypertension). This diet is rich in fruits, vegetables, and low-fat dairy products, and avoids certain fats. PROGNOSIS  Essential  hypertension cannot be cured. Lifestyle changes and medical treatment can lower blood pressure and reduce complications. The prognosis of secondary hypertension depends on the underlying cause. Many people whose hypertension is controlled with medicine or lifestyle changes can live a normal, healthy life.  RISKS AND COMPLICATIONS  While high blood pressure alone is not an illness, it often requires treatment due to its short- and long-term effects on many organs. Hypertension increases your risk for:  CVAs or strokes (cerebrovascular accident).  Heart failure due to chronically high blood pressure (hypertensive cardiomyopathy).  Heart attack (myocardial infarction).  Damage to the retina (hypertensive retinopathy).  Kidney failure (hypertensive nephropathy). Your caregiver can explain list items above that apply to you. Treatment of hypertension can significantly reduce the risk of complications. TREATMENT   For overweight patients, weight loss and regular exercise are recommended. Physical fitness lowers blood pressure.  Mild hypertension is usually treated with diet and exercise. A diet rich in fruits and vegetables, fat-free dairy products, and foods low in fat and salt (sodium) can help lower blood pressure. Decreasing salt intake decreases blood pressure in a 1/3 of people.  Stop smoking if you are a smoker. The steps  above are highly effective in reducing blood pressure. While these actions are easy to suggest, they are difficult to achieve. Most patients with moderate or severe hypertension end up requiring medications to bring their blood pressure down to a normal level. There are several classes of medications for treatment. Blood pressure pills (antihypertensives) will lower blood pressure by their different actions. Lowering the blood pressure by 10 mmHg may decrease the risk of complications by as much as 25%. The goal of treatment is effective blood pressure control. This will  reduce your risk for complications. Your caregiver will help you determine the best treatment for you according to your lifestyle. What is excellent treatment for one person, may not be for you. HOME CARE INSTRUCTIONS   Do not smoke.  Follow the lifestyle changes outlined in the "Prevention" section.  If you are on medications, follow the directions carefully. Blood pressure medications must be taken as prescribed. Skipping doses reduces their benefit. It also puts you at risk for problems.  Follow up with your caregiver, as directed.  If you are asked to monitor your blood pressure at home, follow the guidelines in the "Diagnosis" section above. SEEK MEDICAL CARE IF:   You think you are having medication side effects.  You have recurrent headaches or lightheadedness.  You have swelling in your ankles.  You have trouble with your vision. SEEK IMMEDIATE MEDICAL CARE IF:   You have sudden onset of chest pain or pressure, difficulty breathing, or other symptoms of a heart attack.  You have a severe headache.  You have symptoms of a stroke (such as sudden weakness, difficulty speaking, difficulty walking). MAKE SURE YOU:   Understand these instructions.  Will watch your condition.  Will get help right away if you are not doing well or get worse. Document Released: 01/16/2005 Document Revised: 04/10/2011 Document Reviewed: 08/16/2006 [facility] Patient Information 2014 [place].  DASH Diet The DASH diet stands for "Dietary Approaches to Stop Hypertension." It is a healthy eating plan that has been shown to reduce high blood pressure (hypertension) in as little as 14 days, while also possibly providing other significant health benefits. These other health benefits include reducing the risk of breast cancer after menopause and reducing the risk of type 2 diabetes, heart disease, colon cancer, and stroke. Health benefits also include weight loss and slowing kidney failure in  patients with chronic kidney disease.  DIET GUIDELINES  Limit salt (sodium). Your diet should contain less than 1500 mg of sodium daily.  Limit refined or processed carbohydrates. Your diet should include mostly whole grains. Desserts and added sugars should be used sparingly.  Include small amounts of heart-healthy fats. These types of fats include nuts, oils, and tub margarine. Limit saturated and trans fats. These fats have been shown to be harmful in the body. CHOOSING FOODS  The following food groups are based on a 2000 calorie diet. See your Registered Dietitian for individual calorie needs. Grains and Grain Products (6 to 8 servings daily)  Eat More Often: Whole-wheat bread, brown rice, whole-grain or wheat pasta, quinoa, popcorn without added fat or salt (air popped).  Eat Less Often: White bread, white pasta, white rice, cornbread. Vegetables (4 to 5 servings daily)  Eat More Often: Fresh, frozen, and canned vegetables. Vegetables may be raw, steamed, roasted, or grilled with a minimal amount of fat.  Eat Less Often/Avoid: Creamed or fried vegetables. Vegetables in a cheese sauce. Fruit (4 to 5 servings daily)  Eat More Often: All fresh,  canned (in natural juice), or frozen fruits. Dried fruits without added sugar. One hundred percent fruit juice ( cup [237 mL] daily).  Eat Less Often: Dried fruits with added sugar. Canned fruit in light or heavy syrup. [employer], Fish, and Poultry (2 servings or less daily. One serving is 3 to 4 oz [85-114 g]).  Eat More Often: Ninety percent or leaner ground beef, tenderloin, sirloin. Round cuts of beef, chicken breast, [place] breast. All fish. Grill, bake, or broil your meat. Nothing should be fried.  Eat Less Often/Avoid: Fatty cuts of meat, [place], or chicken leg, thigh, or wing. Fried cuts of meat or fish. Dairy (2 to 3 servings)  Eat More Often: Low-fat or fat-free milk, low-fat plain or light yogurt, reduced-fat or part-skim  cheese.  Eat Less Often/Avoid: Milk (whole, 2%).Whole milk yogurt. Full-fat cheeses. Nuts, Seeds, and Legumes (4 to 5 servings per week)  Eat More Often: All without added salt.  Eat Less Often/Avoid: Salted nuts and seeds, canned beans with added salt. Fats and Sweets (limited)  Eat More Often: Vegetable oils, tub margarines without trans fats, sugar-free gelatin. Mayonnaise and salad dressings.  Eat Less Often/Avoid: Coconut oils, palm oils, butter, stick margarine, cream, half and half, cookies, candy, pie. FOR MORE INFORMATION The Dash Diet Eating Plan: www.dashdiet.org Document Released: 01/05/2011 Document Revised: 04/10/2011 Document Reviewed: 01/05/2011 [facility] Patient Information 2014 [place], [place].

## 2013-04-17 NOTE — ED Provider Notes (Signed)
CSN: [id number]     Arrival date & time 04/14/13  0256 History   First MD Initiated Contact with Patient 04/14/13 0431     Chief Complaint  Patient presents with  . Hypertension     (Consider location/radiation/quality/duration/timing/severity/associated sxs/prior Treatment) HPI Comments: Pt comes in with cc of elevated BP. Hx of HTN - on clonidine 0.1 mg. Pt was just checking her BP randomly, and noted that it was elevated. She kept a log of BP, took clonidine 0.1 mg, and decided to come to the ED. PT had some pain between her collar bone during triage, but currently has no chest pain, headaches, active visual complains, nausea, dib. BP is also much improved.  Patient is a 80 y.o. female presenting with hypertension. The history is provided by the patient.  Hypertension Pertinent negatives include no chest pain, no abdominal pain, no headaches and no shortness of breath.    Past Medical History  Diagnosis Date  . Personal history of colonic polyps     hyperplastic  . Hemorrhoids   . Thoracic aortic aneurysm   . Paroxysmal supraventricular tachycardia   . Factor V deficiency   . TMJ pain dysfunction syndrome   . DVT (deep venous thrombosis)   . Hyperlipemia   . Blood clotting disorder     V5 factor  . Clostridium difficile infection   . Hypertension   . Infiltrating ductal carcinoma of breast     stage one right   . Blood dyscrasia     factor 5 deficiency  . Arthritis     lumbar stenosis, radiculopathy, OA- L hip  . Clotting disorder    Past Surgical History  Procedure Laterality Date  . Total hip arthroplasty  1999    right  . Laparoscopic hysterectomy    . Splenectomy    . Right calf surgery      for cancer  . Colonoscopy    . Upper gastrointestinal endoscopy    . Joint replacement      R hip replacement   . Breast lumpectomy  2010    right Dr. [name]  . Abdominal hysterectomy      1960- ? vaginal hysterectomy, not laparoscopic   . Eye surgery     bilateral cataracts- /w IOL   . Lumbar laminectomy/decompression microdiscectomy  04/11/2011    Procedure: LUMBAR LAMINECTOMY/DECOMPRESSION MICRODISCECTOMY;  Surgeon: [name], MD;  Location: [facility];  Service: Neurosurgery;  Laterality: N/A;  Lumbar Five-Sacral One Microdiscectomy   Family History  Problem Relation Age of Onset  . Heart disease Mother   . Heart disease Father   . Heart disease      uncles  . Clotting disorder Maternal Uncle   . Breast cancer Paternal Aunt   . Cancer Paternal Aunt     breast  . Colon cancer Neg Hx   . Anesthesia problems Neg Hx   . Hypotension Neg Hx   . Malignant hyperthermia Neg Hx   . Pseudochol deficiency Neg Hx    History  Substance Use Topics  . Smoking status: Never Smoker   . Smokeless tobacco: Never Used  . Alcohol Use: No   OB History   Grav Para Term Preterm Abortions TAB SAB Ect Mult Living                 Review of Systems  Constitutional: Negative for activity change.  Respiratory: Negative for shortness of breath.   Cardiovascular: Negative for chest pain.  Gastrointestinal: Negative for nausea,  vomiting and abdominal pain.  Genitourinary: Negative for dysuria.  Musculoskeletal: Negative for neck pain.  Neurological: Negative for headaches.      Allergies  Cefuroxime axetil and Sulfonamide derivatives  Home Medications   Current Outpatient Rx  Name  Route  Sig  Dispense  Refill  . cloNIDine (CATAPRES) 0.1 MG tablet   Oral   Take 1 tablet (0.1 mg total) by mouth 3 (three) times daily.   90 tablet   3   . lisinopril (PRINIVIL,ZESTRIL) 40 MG tablet   Oral   Take 1 tablet (40 mg total) by mouth daily.   30 tablet   11   . warfarin (COUMADIN) 5 MG tablet   Oral   Take 2.5-5 mg by mouth See admin instructions. Take 1 tablet on Tuesday, Wednesday, Thursday, Saturday and Sunday. Take 0.5 tablet on Monday and Friday.         . furosemide (LASIX) 20 MG tablet   Oral   Take 1 tablet (20 mg total) by  mouth 2 (two) times daily.   10 tablet   0    BP 143/78  Pulse 101  Temp(Src) 98.1 F (36.7 C) (Oral)  Resp 15  Ht 5\' 3"  (1.6 m)  Wt 174 lb (78.926 kg)  BMI 30.83 kg/m2  SpO2 97% Physical Exam  Nursing note and vitals reviewed. Constitutional: She is oriented to person, place, and time. She appears well-developed and well-nourished.  HENT:  Head: Normocephalic and atraumatic.  Eyes: EOM are normal. Pupils are equal, round, and reactive to light.  Neck: Neck supple.  Cardiovascular: Normal rate, regular rhythm and normal heart sounds.   No murmur heard. Pulmonary/Chest: Effort normal. No respiratory distress.  Abdominal: Soft. She exhibits no distension. There is no tenderness. There is no rebound and no guarding.  Musculoskeletal: She exhibits edema.  Neurological: She is alert and oriented to person, place, and time.  Skin: Skin is warm and dry.    ED Course  Procedures (including critical care time) Labs Review Labs Reviewed  BASIC METABOLIC PANEL - Abnormal; Notable for the following:    Glucose, Bld 116 (*)    GFR calc non Af Amer 69 (*)    GFR calc Af Amer 80 (*)    All other components within normal limits  CBC  TROPONIN I  PRO B NATRIURETIC PEPTIDE  ated  Imaging Review No results found.   EKG Interpretation   Date/Time:  Monday April 14 2013 04:22:32 EDT Ventricular Rate:  52 PR Interval:  173 QRS Duration: 88 QT Interval:  448 QTC Calculation: 417 R Axis:   -39 Text Interpretation:  Age not entered, assumed to be  80 years old for  purpose of ECG interpretation Sinus rhythm Left axis deviation Nonspecific  T abnormalities, lateral leads Confirmed by Kathrynn Humble, MD, [place] Lazenby [phone]) on  04/14/2013 4:44:40 AM      MDM   Final diagnoses:  Essential hypertension  Bilateral lower extremity edema   PT comes in with elevated BP. No red flags on hx during the exam. EKG ordered due to some pain in the back upon arrival. Vascular exam is fine, neuro  exam grossly benign. Will d.c as the BP is much better, and no acute intervention needed.  Of note - pt does have peripheral edema, bilateral - with BNP relatively low. Possibly medication related, possibly vascular insufficiency.  Varney Biles, MD 04/17/13 [phone]

## 2013-04-22 ENCOUNTER — Other Ambulatory Visit: Payer: Self-pay | Admitting: Internal Medicine

## 2013-04-22 ENCOUNTER — Ambulatory Visit: Payer: BC Managed Care – PPO

## 2013-04-22 ENCOUNTER — Ambulatory Visit (INDEPENDENT_AMBULATORY_CARE_PROVIDER_SITE_OTHER): Payer: BC Managed Care – PPO | Admitting: Internal Medicine

## 2013-04-22 ENCOUNTER — Ambulatory Visit: Payer: Medicare Other | Admitting: Internal Medicine

## 2013-04-22 VITALS — BP 160/90

## 2013-04-22 DIAGNOSIS — I10 Essential (primary) hypertension: Secondary | ICD-10-CM

## 2013-04-22 MED ORDER — FUROSEMIDE 40 MG PO TABS: 40.0000 mg | ORAL_TABLET | Freq: Every day | ORAL | Status: DC

## 2013-04-22 NOTE — Assessment & Plan Note (Signed)
BP Readings from Last 3 Encounters:  04/22/13 160/90  04/14/13 143/78  04/08/13 150/78   Home readings are in the 130's most of the time. She has been taking lasix 20 mg bid for the last 10 days.  Plan Continue ACE-I, clonidine and now lasix 40 mg once a day.

## 2013-04-22 NOTE — Progress Notes (Signed)
   Subjective:    Patient ID: [name] Little, female    DOB: February 22, 1933, 80 y.o.   MRN: [id number]  HPI [name] presents for BP check. She was recently seen at ED 3/16 for elevated BP. She had a normal exam. She was started on furosemide.  PMH, FamHx and SocHx reviewed for any changes and relevance.  Current Outpatient Prescriptions on File Prior to Visit  Medication Sig Dispense Refill  . cloNIDine (CATAPRES) 0.1 MG tablet Take 1 tablet (0.1 mg total) by mouth 3 (three) times daily.  90 tablet  3  . furosemide (LASIX) 40 MG tablet Take 1 tablet (40 mg total) by mouth daily.  30 tablet  11  . lisinopril (PRINIVIL,ZESTRIL) 40 MG tablet Take 1 tablet (40 mg total) by mouth daily.  30 tablet  11  . warfarin (COUMADIN) 5 MG tablet Take 2.5-5 mg by mouth See admin instructions. Take 1 tablet on Tuesday, Wednesday, Thursday, Saturday and Sunday. Take 0.5 tablet on Monday and Friday.       No current facility-administered medications on file prior to visit.      Review of Systems .mnrsob     Objective:   Physical Exam There were no vitals filed for this visit. BP Readings from Last 3 Encounters:  04/22/13 160/90  04/14/13 143/78  04/08/13 150/78   Gen;l- WNWD woman in no distress HEENT - C&S clear Cor- RRR Pulm - normal Neuro - non focal       Assessment & Plan:

## 2013-04-22 NOTE — Patient Instructions (Signed)
Continue present medications but add furosemide as a long term medication.

## 2013-04-24 ENCOUNTER — Telehealth: Payer: Self-pay

## 2013-04-24 NOTE — Telephone Encounter (Signed)
Phone call from patient wanting to follow up on the request of her being transferred to Dr Linna Darner

## 2013-05-07 ENCOUNTER — Ambulatory Visit (INDEPENDENT_AMBULATORY_CARE_PROVIDER_SITE_OTHER): Payer: Medicare Other | Admitting: General Practice

## 2013-05-07 DIAGNOSIS — Z5181 Encounter for therapeutic drug level monitoring: Secondary | ICD-10-CM

## 2013-05-07 DIAGNOSIS — D682 Hereditary deficiency of other clotting factors: Secondary | ICD-10-CM

## 2013-05-07 DIAGNOSIS — Z86718 Personal history of other venous thrombosis and embolism: Secondary | ICD-10-CM

## 2013-05-07 LAB — POCT INR: INR: 2.1

## 2013-05-07 NOTE — Progress Notes (Signed)
Pre visit review using our clinic review tool, if applicable. No additional management support is needed unless otherwise documented below in the visit note. 

## 2013-05-21 ENCOUNTER — Telehealth: Payer: Self-pay | Admitting: Internal Medicine

## 2013-05-21 MED ORDER — CLONIDINE HCL 0.1 MG PO TABS: 0.1000 mg | ORAL_TABLET | Freq: Three times a day (TID) | ORAL | Status: DC

## 2013-05-21 NOTE — Telephone Encounter (Signed)
Patient called and requested a refill for cloNIDine (CATAPRES) 0.1 MG tablet. Patient has a new patient appointment on 06/30/2013 with dr Birdie Riddle but has ran out of her medication until her appointment. Please advise.

## 2013-05-21 NOTE — Telephone Encounter (Signed)
Medication filled due to old physician unwilling to fill anymore, recent BMP from hospital and ok per norins previously.

## 2013-05-21 NOTE — Telephone Encounter (Signed)
Please call patient have them schedule an appointment.

## 2013-06-09 ENCOUNTER — Ambulatory Visit (INDEPENDENT_AMBULATORY_CARE_PROVIDER_SITE_OTHER): Payer: Medicare Other | Admitting: Family Medicine

## 2013-06-09 ENCOUNTER — Encounter: Payer: Self-pay | Admitting: Family Medicine

## 2013-06-09 VITALS — BP 140/82 | HR 73 | Temp 98.2°F | Resp 17 | Wt 167.2 lb

## 2013-06-09 DIAGNOSIS — J069 Acute upper respiratory infection, unspecified: Secondary | ICD-10-CM

## 2013-06-09 MED ORDER — GUAIFENESIN-CODEINE 100-10 MG/5ML PO SYRP: 10.0000 mL | ORAL_SOLUTION | Freq: Three times a day (TID) | ORAL | Status: DC | PRN

## 2013-06-09 MED ORDER — BENZONATATE 200 MG PO CAPS: 200.0000 mg | ORAL_CAPSULE | Freq: Three times a day (TID) | ORAL | Status: DC | PRN

## 2013-06-09 NOTE — Progress Notes (Signed)
Pre visit review using our clinic review tool, if applicable. No additional management support is needed unless otherwise documented below in the visit note. 

## 2013-06-09 NOTE — Progress Notes (Signed)
   Subjective:    Patient ID: [name] Little, female    DOB: August 25, 1933, 80 y.o.   MRN: [id number]  Headache   Sore Throat  Associated symptoms include headaches.   URI- sxs started w/ sore throat 3 days ago.  Pt initially felt it was due to working in the yard but sxs have not improved.  No fevers.  + hoarseness.  + HA.  No facial pain/pressure.  No ear pain.  No nasal congestion.  Cough is intermittently productive.  No known sick contacts.   Review of Systems  Neurological: Positive for headaches.   For ROS see HPI     Objective:   Physical Exam  Vitals reviewed. Constitutional: She appears well-developed and well-nourished. No distress.  HENT:  Head: Normocephalic and atraumatic.  Right Ear: Tympanic membrane normal.  Left Ear: Tympanic membrane normal.  Nose: Mucosal edema and rhinorrhea present. Right sinus exhibits no maxillary sinus tenderness and no frontal sinus tenderness. Left sinus exhibits no maxillary sinus tenderness and no frontal sinus tenderness.  Mouth/Throat: Mucous membranes are normal. Posterior oropharyngeal erythema (w/ PND) present.  Eyes: Conjunctivae and EOM are normal. Pupils are equal, round, and reactive to light.  Neck: Normal range of motion. Neck supple.  Cardiovascular: Normal rate, regular rhythm and normal heart sounds.   Pulmonary/Chest: Effort normal and breath sounds normal. No respiratory distress. She has no wheezes. She has no rales.  Lymphadenopathy:    She has no cervical adenopathy.          Assessment & Plan:

## 2013-06-09 NOTE — Patient Instructions (Signed)
Follow up as needed This appears to be a viral/allergy combo Start Claritin or Zyrtec (available over the counter and store brand works just as good) Use the [employer] for daytime cough, the Cheratussin syrup for night Drink plenty of fluids REST! Hang in there!!

## 2013-06-09 NOTE — Assessment & Plan Note (Signed)
New.  Suspect viral/allergy combo.  No evidence of bacterial infxn- no need for abx.  Start cough meds prn.  Add OTC antihistamine for allergy component.  Reviewed supportive care and red flags that should prompt return.  Pt expressed understanding and is in agreement w/ plan.

## 2013-06-18 ENCOUNTER — Ambulatory Visit (INDEPENDENT_AMBULATORY_CARE_PROVIDER_SITE_OTHER): Payer: Medicare Other | Admitting: General Practice

## 2013-06-18 DIAGNOSIS — Z86718 Personal history of other venous thrombosis and embolism: Secondary | ICD-10-CM

## 2013-06-18 DIAGNOSIS — Z5181 Encounter for therapeutic drug level monitoring: Secondary | ICD-10-CM

## 2013-06-18 DIAGNOSIS — D682 Hereditary deficiency of other clotting factors: Secondary | ICD-10-CM

## 2013-06-18 LAB — POCT INR: INR: 3.3

## 2013-06-18 NOTE — Progress Notes (Signed)
Pre visit review using our clinic review tool, if applicable. No additional management support is needed unless otherwise documented below in the visit note. 

## 2013-06-26 ENCOUNTER — Encounter: Payer: Self-pay | Admitting: Family Medicine

## 2013-06-26 ENCOUNTER — Ambulatory Visit (INDEPENDENT_AMBULATORY_CARE_PROVIDER_SITE_OTHER): Payer: Medicare Other | Admitting: Family Medicine

## 2013-06-26 ENCOUNTER — Telehealth: Payer: Self-pay | Admitting: Family Medicine

## 2013-06-26 VITALS — BP 130/80 | HR 64 | Temp 98.0°F | Ht 63.0 in | Wt 168.0 lb

## 2013-06-26 DIAGNOSIS — D682 Hereditary deficiency of other clotting factors: Secondary | ICD-10-CM

## 2013-06-26 DIAGNOSIS — I10 Essential (primary) hypertension: Secondary | ICD-10-CM

## 2013-06-26 MED ORDER — LOSARTAN POTASSIUM 50 MG PO TABS: 50.0000 mg | ORAL_TABLET | Freq: Every day | ORAL | Status: DC

## 2013-06-26 NOTE — Patient Instructions (Signed)
Want bp <140/90, consistently, very hi such as >170/100 call immediately  Nurse visit in roughly 4 weeks for bp check   DASH Diet The DASH diet stands for "Dietary Approaches to Stop Hypertension." It is a healthy eating plan that has been shown to reduce high blood pressure (hypertension) in as little as 14 days, while also possibly providing other significant health benefits. These other health benefits include reducing the risk of breast cancer after menopause and reducing the risk of type 2 diabetes, heart disease, colon cancer, and stroke. Health benefits also include weight loss and slowing kidney failure in patients with chronic kidney disease.  DIET GUIDELINES  Limit salt (sodium). Your diet should contain less than 1500 mg of sodium daily.  Limit refined or processed carbohydrates. Your diet should include mostly whole grains. Desserts and added sugars should be used sparingly.  Include small amounts of heart-healthy fats. These types of fats include nuts, oils, and tub margarine. Limit saturated and trans fats. These fats have been shown to be harmful in the body. CHOOSING FOODS  The following food groups are based on a 2000 calorie diet. See your Registered Dietitian for individual calorie needs. Grains and Grain Products (6 to 8 servings daily)  Eat More Often: Whole-wheat bread, brown rice, whole-grain or wheat pasta, quinoa, popcorn without added fat or salt (air popped).  Eat Less Often: White bread, white pasta, white rice, cornbread. Vegetables (4 to 5 servings daily)  Eat More Often: Fresh, frozen, and canned vegetables. Vegetables may be raw, steamed, roasted, or grilled with a minimal amount of fat.  Eat Less Often/Avoid: Creamed or fried vegetables. Vegetables in a cheese sauce. Fruit (4 to 5 servings daily)  Eat More Often: All fresh, canned (in natural juice), or frozen fruits. Dried fruits without added sugar. One hundred percent fruit juice ( cup [237 mL]  daily).  Eat Less Often: Dried fruits with added sugar. Canned fruit in light or heavy syrup. [employer], Fish, and Poultry (2 servings or less daily. One serving is 3 to 4 oz [85-114 g]).  Eat More Often: Ninety percent or leaner ground beef, tenderloin, sirloin. Round cuts of beef, chicken breast, [place] breast. All fish. Grill, bake, or broil your meat. Nothing should be fried.  Eat Less Often/Avoid: Fatty cuts of meat, [place], or chicken leg, thigh, or wing. Fried cuts of meat or fish. Dairy (2 to 3 servings)  Eat More Often: Low-fat or fat-free milk, low-fat plain or light yogurt, reduced-fat or part-skim cheese.  Eat Less Often/Avoid: Milk (whole, 2%).Whole milk yogurt. Full-fat cheeses. Nuts, Seeds, and Legumes (4 to 5 servings per week)  Eat More Often: All without added salt.  Eat Less Often/Avoid: Salted nuts and seeds, canned beans with added salt. Fats and Sweets (limited)  Eat More Often: Vegetable oils, tub margarines without trans fats, sugar-free gelatin. Mayonnaise and salad dressings.  Eat Less Often/Avoid: Coconut oils, palm oils, butter, stick margarine, cream, half and half, cookies, candy, pie. FOR MORE INFORMATION The Dash Diet Eating Plan: www.dashdiet.org Document Released: 01/05/2011 Document Revised: 04/10/2011 Document Reviewed: 01/05/2011 [facility] Patient Information 2014 [place], [place].

## 2013-06-26 NOTE — Progress Notes (Signed)
Pre visit review using our clinic review tool, if applicable. No additional management support is needed unless otherwise documented below in the visit note. 

## 2013-06-26 NOTE — Telephone Encounter (Signed)
laborder week of 8-17-2015comments: Annual exam with labs prior to visit lipid, renal, cbc, tsh, hepatic

## 2013-06-29 ENCOUNTER — Encounter: Payer: Self-pay | Admitting: Family Medicine

## 2013-06-29 NOTE — Progress Notes (Signed)
Patient ID: [name] Little, female   DOB: 1933-11-25, 80 y.o.   MRN: [id number] [name] Little [id number] February 03, 1933 06/29/2013      Progress Note-Follow Up  Subjective  Chief Complaint  Chief Complaint  Patient presents with  . Lisinopril making her sick    eats breakfast then takes Lisinopril and it makes her so nauseas then has to eat again to "absorb" tablet    HPI  Patient is a 80 year old female in today for routine medical care. Patient to discuss her blood pressure meds. She is new to this office but her primary Dr. has retired. Lisinopril was making her nauseous so she stopped it. Since stopping it the nausea has resolved. She reports otherwise feeling well. Denies CP/palp/SOB/HA/congestion/fevers/GI or GU c/o.   Past Medical History  Diagnosis Date  . Personal history of colonic polyps     hyperplastic  . Hemorrhoids   . Thoracic aortic aneurysm   . Paroxysmal supraventricular tachycardia   . Factor V deficiency   . TMJ pain dysfunction syndrome   . DVT (deep venous thrombosis)   . Hyperlipemia   . Blood clotting disorder     V5 factor  . Clostridium difficile infection   . Hypertension   . Infiltrating ductal carcinoma of breast     stage one right   . Blood dyscrasia     factor 5 deficiency  . Arthritis     lumbar stenosis, radiculopathy, OA- L hip  . Clotting disorder     Past Surgical History  Procedure Laterality Date  . Total hip arthroplasty  1999    right  . Laparoscopic hysterectomy    . Splenectomy    . Right calf surgery      for cancer  . Colonoscopy    . Upper gastrointestinal endoscopy    . Joint replacement      R hip replacement   . Breast lumpectomy  2010    right Dr. [name]  . Abdominal hysterectomy      1960- ? vaginal hysterectomy, not laparoscopic   . Eye surgery      bilateral cataracts- /w IOL   . Lumbar laminectomy/decompression microdiscectomy  04/11/2011    Procedure: LUMBAR LAMINECTOMY/DECOMPRESSION MICRODISCECTOMY;   Surgeon: [name], MD;  Location: [facility];  Service: Neurosurgery;  Laterality: N/A;  Lumbar Five-Sacral One Microdiscectomy    Family History  Problem Relation Age of Onset  . Heart disease Mother   . Heart disease Father   . Heart disease      uncles  . Clotting disorder Maternal Uncle   . Breast cancer Paternal Aunt   . Cancer Paternal Aunt     breast  . Colon cancer Neg Hx   . Anesthesia problems Neg Hx   . Hypotension Neg Hx   . Malignant hyperthermia Neg Hx   . Pseudochol deficiency Neg Hx     History   Social History  . Marital Status: Married    Spouse Name: N/A    Number of Children: 4  . Years of Education: N/A   Occupational History  . Retired    Social History Main Topics  . Smoking status: Never Smoker   . Smokeless tobacco: Never Used  . Alcohol Use: No  . Drug Use: No  . Sexual Activity: Not on file   Other Topics Concern  . Not on file   Social History Narrative   Married 1955   2 sons - '59, '61, 2 daughters '  39, '57; 8 grandchildren; 1 great grand   I-ADLs          Current Outpatient Prescriptions on File Prior to Visit  Medication Sig Dispense Refill  . cloNIDine (CATAPRES) 0.1 MG tablet Take 1 tablet (0.1 mg total) by mouth 3 (three) times daily.  90 tablet  2  . warfarin (COUMADIN) 5 MG tablet Take 2.5-5 mg by mouth See admin instructions. Take 1 tablet on Tuesday, Wednesday, Thursday, Saturday and Sunday. Take 2.5 tablet on Monday and Friday.       No current facility-administered medications on file prior to visit.    Allergies  Allergen Reactions  . Cefuroxime Axetil     REACTION: Ddoes not remember reaction  . Sulfonamide Derivatives     REACTION: Rash    Review of Systems  Review of Systems  Constitutional: Negative for fever and malaise/fatigue.  HENT: Negative for congestion.   Eyes: Negative for discharge.  Respiratory: Negative for shortness of breath.   Cardiovascular: Negative for chest pain, palpitations  and leg swelling.  Gastrointestinal: Negative for nausea, abdominal pain and diarrhea.  Genitourinary: Negative for dysuria.  Musculoskeletal: Negative for falls.  Skin: Negative for rash.  Neurological: Negative for loss of consciousness and headaches.  Endo/Heme/Allergies: Negative for polydipsia.  Psychiatric/Behavioral: Negative for depression and suicidal ideas. The patient is not nervous/anxious and does not have insomnia.     Objective  BP 130/80  Pulse 64  Temp(Src) 98 F (36.7 C) (Oral)  Ht 5\' 3"  (1.6 m)  Wt 168 lb 0.6 oz (76.222 kg)  BMI 29.77 kg/m2  SpO2 94%  Physical Exam  Physical Exam  Constitutional: She is oriented to person, place, and time and well-developed, well-nourished, and in no distress. No distress.  HENT:  Head: Normocephalic and atraumatic.  Eyes: Conjunctivae are normal.  Neck: Neck supple. No thyromegaly present.  Cardiovascular: Normal rate, regular rhythm and normal heart sounds.   No murmur heard. Pulmonary/Chest: Effort normal and breath sounds normal. She has no wheezes.  Abdominal: She exhibits no distension and no mass.  Musculoskeletal: She exhibits no edema.  Lymphadenopathy:    She has no cervical adenopathy.  Neurological: She is alert and oriented to person, place, and time.  Skin: Skin is warm and dry. No rash noted. She is not diaphoretic.  Psychiatric: Memory, affect and judgment normal.    Lab Results  Component Value Date   TSH 4.18 04/01/2013   Lab Results  Component Value Date   WBC 7.1 04/14/2013   HGB 12.8 04/14/2013   HCT 38.5 04/14/2013   MCV 89.5 04/14/2013   PLT 332 04/14/2013   Lab Results  Component Value Date   CREATININE 0.80 04/14/2013   BUN 19 04/14/2013   NA 142 04/14/2013   K 4.1 04/14/2013   CL 104 04/14/2013   CO2 27 04/14/2013   Lab Results  Component Value Date   ALT 12 01/22/2013   AST 20 01/22/2013   ALKPHOS 70 01/22/2013   BILITOT 0.3 01/22/2013   Lab Results  Component Value Date   CHOL  228* 04/01/2013   Lab Results  Component Value Date   HDL 38.90* 04/01/2013   Lab Results  Component Value Date   LDLCALC 150* 04/01/2013   Lab Results  Component Value Date   TRIG 195.0* 04/01/2013   Lab Results  Component Value Date   CHOLHDL 6 04/01/2013     Assessment & Plan  Unspecified essential hypertension Will add Losartan and continue Clonidine for  now  FACTOR V DEFICIENCY Tolerating coumadin follows with coumadin clinic

## 2013-06-29 NOTE — Assessment & Plan Note (Signed)
Tolerating coumadin follows with coumadin clinic 

## 2013-06-29 NOTE — Assessment & Plan Note (Signed)
Will add Losartan and continue Clonidine for now

## 2013-06-30 ENCOUNTER — Ambulatory Visit: Payer: BC Managed Care – PPO | Admitting: Family Medicine

## 2013-07-04 ENCOUNTER — Other Ambulatory Visit: Payer: Self-pay | Admitting: Oncology

## 2013-07-04 DIAGNOSIS — Z853 Personal history of malignant neoplasm of breast: Secondary | ICD-10-CM

## 2013-07-16 ENCOUNTER — Ambulatory Visit (INDEPENDENT_AMBULATORY_CARE_PROVIDER_SITE_OTHER): Payer: Medicare Other | Admitting: General Practice

## 2013-07-16 DIAGNOSIS — D682 Hereditary deficiency of other clotting factors: Secondary | ICD-10-CM

## 2013-07-16 DIAGNOSIS — Z5181 Encounter for therapeutic drug level monitoring: Secondary | ICD-10-CM

## 2013-07-16 DIAGNOSIS — Z86718 Personal history of other venous thrombosis and embolism: Secondary | ICD-10-CM

## 2013-07-16 LAB — POCT INR: INR: 2.5

## 2013-07-16 NOTE — Progress Notes (Signed)
Pre visit review using our clinic review tool, if applicable. No additional management support is needed unless otherwise documented below in the visit note. 

## 2013-07-21 ENCOUNTER — Ambulatory Visit (INDEPENDENT_AMBULATORY_CARE_PROVIDER_SITE_OTHER): Payer: Medicare Other | Admitting: *Deleted

## 2013-07-21 VITALS — BP 140/80

## 2013-07-21 DIAGNOSIS — I10 Essential (primary) hypertension: Secondary | ICD-10-CM

## 2013-08-11 ENCOUNTER — Other Ambulatory Visit: Payer: Self-pay | Admitting: *Deleted

## 2013-08-11 DIAGNOSIS — C50919 Malignant neoplasm of unspecified site of unspecified female breast: Secondary | ICD-10-CM

## 2013-08-12 ENCOUNTER — Other Ambulatory Visit (HOSPITAL_BASED_OUTPATIENT_CLINIC_OR_DEPARTMENT_OTHER): Payer: Medicare Other

## 2013-08-12 ENCOUNTER — Ambulatory Visit
Admission: RE | Admit: 2013-08-12 | Discharge: 2013-08-12 | Disposition: A | Discharge status: Home / Self Care | Payer: Medicare Other | Source: Ambulatory Visit | Attending: Oncology | Admitting: Oncology

## 2013-08-12 DIAGNOSIS — Z853 Personal history of malignant neoplasm of breast: Secondary | ICD-10-CM

## 2013-08-12 DIAGNOSIS — C50919 Malignant neoplasm of unspecified site of unspecified female breast: Secondary | ICD-10-CM

## 2013-08-12 LAB — COMPREHENSIVE METABOLIC PANEL (CC13)
ALT: 14 U/L (ref 0–55)
AST: 17 U/L (ref 5–34)
Albumin: 3.7 g/dL (ref 3.5–5.0)
Alkaline Phosphatase: 72 U/L (ref 40–150)
Anion Gap: 7 mEq/L (ref 3–11)
BUN: 20.5 mg/dL (ref 7.0–26.0)
CO2: 25 mEq/L (ref 22–29)
Calcium: 9.5 mg/dL (ref 8.4–10.4)
Chloride: 109 mEq/L (ref 98–109)
Creatinine: 0.9 mg/dL (ref 0.6–1.1)
Glucose: 115 mg/dl (ref 70–140)
Potassium: 4 mEq/L (ref 3.5–5.1)
Sodium: 141 mEq/L (ref 136–145)
Total Bilirubin: 0.46 mg/dL (ref 0.20–1.20)
Total Protein: 7.5 g/dL (ref 6.4–8.3)

## 2013-08-12 LAB — CBC WITH DIFFERENTIAL/PLATELET
BASO%: 1 % (ref 0.0–2.0)
Basophils Absolute: 0.1 10*3/uL (ref 0.0–0.1)
EOS%: 2.1 % (ref 0.0–7.0)
Eosinophils Absolute: 0.2 10*3/uL (ref 0.0–0.5)
HCT: 42.5 % (ref 34.8–46.6)
HGB: 13.8 g/dL (ref 11.6–15.9)
LYMPH%: 40.1 % (ref 14.0–49.7)
MCH: 29.4 pg (ref 25.1–34.0)
MCHC: 32.5 g/dL (ref 31.5–36.0)
MCV: 90.4 fL (ref 79.5–101.0)
MONO#: 0.9 10*3/uL (ref 0.1–0.9)
MONO%: 13.1 % (ref 0.0–14.0)
NEUT#: 3.1 10*3/uL (ref 1.5–6.5)
NEUT%: 43.7 % (ref 38.4–76.8)
Platelets: 299 10*3/uL (ref 145–400)
RBC: 4.7 10*6/uL (ref 3.70–5.45)
RDW: 15.2 % — ABNORMAL HIGH (ref 11.2–14.5)
WBC: 7.2 10*3/uL (ref 3.9–10.3)
lymph#: 2.9 10*3/uL (ref 0.9–3.3)

## 2013-08-19 ENCOUNTER — Ambulatory Visit (HOSPITAL_BASED_OUTPATIENT_CLINIC_OR_DEPARTMENT_OTHER): Payer: Medicare Other | Admitting: Oncology

## 2013-08-19 VITALS — BP 168/84 | HR 63 | Temp 98.1°F | Resp 18 | Ht 63.0 in | Wt 165.0 lb

## 2013-08-19 DIAGNOSIS — Z832 Family history of diseases of the blood and blood-forming organs and certain disorders involving the immune mechanism: Secondary | ICD-10-CM

## 2013-08-19 DIAGNOSIS — Z853 Personal history of malignant neoplasm of breast: Secondary | ICD-10-CM

## 2013-08-19 DIAGNOSIS — C50919 Malignant neoplasm of unspecified site of unspecified female breast: Secondary | ICD-10-CM

## 2013-08-19 DIAGNOSIS — D6859 Other primary thrombophilia: Secondary | ICD-10-CM

## 2013-08-19 DIAGNOSIS — I10 Essential (primary) hypertension: Secondary | ICD-10-CM

## 2013-08-19 NOTE — Progress Notes (Signed)
ID: [name] OB: 1933/05/29  MR#: [id number]  OIB#:[id number]  PCP: [name] Homans, MD GYN:  [name] Little SU: [name] Little OTHER MD: [name] Little, [name] Little   HISTORY OF PRESENT ILLNESS: [name] Little is a 80 y.o. [name] woman I saw remotely for evaluation of factor V Leiden-associated clotting disorders.  More recently, she had her annual screening mammogram July 01, 2008, which showed a possible mass in the right breast.  She was recalled for further studies on June 9 and spot compression views confirmed a small oval mass with poorly defined margins in the lateral right breast, which was not palpable by [name] Little on physical exam.  Ultrasound showed a 6 mm oval mass with mildly irregular microlobulated margins, which was hypoechoic and had internal blood flow with color Doppler.  The right axilla showed no abnormal-appearing lymph nodes.  Biopsy was obtained the same day and showed ([id number] and [id number]) a strongly ER and PR positive (both 100%) invasive ductal carcinoma which appeared low grade, had an MIB-1 of 8% and was negative for HER-2 amplification by CISH, with a ratio of 1.13.  With this information, the patient was referred to [name] Little and bilateral breast MRIs were obtained on June 18.  This showed a solitary 1.5 cm area of enhancement in the lower outer quadrant of the right breast, which is the site of the previous biopsy-proven malignancy.  There was no evidence of malignancy in the left breast or in either axilla.    The patient proceeded to definitive needle-localized right lumpectomy and axillary sentinel lymph node biopsy July 30, 2008.  The final pathology [id number]) showed a 1.5 cm invasive ductal carcinoma, grade 1, with ample margins, no evidence of lymphovascular invasion and the sentinel lymph node negative for disease spread. Her sense history is as detailed below.  INTERVAL HISTORY: [name] Little returns today for followup of her breast cancer. Interval history is  significant for her having been diagnosed with hypertension. She is working with [name]. Charlett Little to bring that under control. Otherwise the patient remains very vigorous, yesterday mowed 3 acres, worked in her garden, and did all her housecleaning. Family also is doing well  REVIEW OF SYSTEMS: She continues on chronic anticoagulation for factor V Leiden. As been no bleeding or clotting issues. She does bruise easily. She is mildly constipated from the anti-hypertensives, she feels. She feels a little anxious, and occasionally has an irregular heartbeat, but these are not new concerns. A detailed review of systems today was entirely benign  PAST MEDICAL HISTORY: Past Medical History  Diagnosis Date  . Personal history of colonic polyps     hyperplastic  . Hemorrhoids   . Thoracic aortic aneurysm   . Paroxysmal supraventricular tachycardia   . Factor V deficiency   . TMJ pain dysfunction syndrome   . DVT (deep venous thrombosis)   . Hyperlipemia   . Blood clotting disorder     V5 factor  . Clostridium difficile infection   . Hypertension   . Infiltrating ductal carcinoma of breast     stage one right   . Blood dyscrasia     factor 5 deficiency  . Arthritis     lumbar stenosis, radiculopathy, OA- L hip  . Clotting disorder     PAST SURGICAL HISTORY: Past Surgical History  Procedure Laterality Date  . Total hip arthroplasty  1999    right  . Laparoscopic hysterectomy    . Splenectomy    . Right calf surgery  for cancer  . Colonoscopy    . Upper gastrointestinal endoscopy    . Joint replacement      R hip replacement   . Breast lumpectomy  2010    right [name]. Margot Little  . Abdominal hysterectomy      1960- ? vaginal hysterectomy, not laparoscopic   . Eye surgery      bilateral cataracts- /w IOL   . Lumbar laminectomy/decompression microdiscectomy  04/11/2011    Procedure: LUMBAR LAMINECTOMY/DECOMPRESSION MICRODISCECTOMY;  Surgeon: [name], MD;  Location: [place] [facility];   Service: Neurosurgery;  Laterality: N/A;  Lumbar Five-Sacral One Microdiscectomy    FAMILY HISTORY Family History  Problem Relation Age of Onset  . Heart disease Mother   . Heart disease Father   . Heart disease      uncles  . Clotting disorder Maternal Uncle   . Breast cancer Paternal Aunt   . Cancer Paternal Aunt     breast  . Colon cancer Neg Hx   . Anesthesia problems Neg Hx   . Hypotension Neg Hx   . Malignant hyperthermia Neg Hx   . Pseudochol deficiency Neg Hx   The patient's father died at the age of 78 from a stroke.  The patient's mother died at the age of 78, also from a stroke.  Neither had a history of DVTs.  The patient has 3 sisters and 1 brother.  No history of breast or ovarian cancers and the siblings do not have a history of coagulopathy.  GYNECOLOGIC HISTORY:  She is GX, P4, first pregnancy to term age 13.  Her hysterectomy was at age 80 and she took hormones for several years, perhaps 5 to 62, after that.  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  He also works as a Administrator and she drove a school bus for 21 years.  Their children are [name] Little, who lives in [place], [place], and works for [employer]; [name] Little in [place], who manages channel.  [name] Little's daughter (the patient's granddaughter), [name] Little, is  studying to be a Marine scientist.  Son [name] Little lives in [place], [place], and works in Publishing rights manager.  Son [name] Little lives in [place] and is a Counsellor for [employer].  The patient is a Psychologist, forensic.    ADVANCED DIRECTIVES: Not in place   HEALTH MAINTENANCE: History  Substance Use Topics  . Smoking status: Never Smoker   . Smokeless tobacco: Never Used  . Alcohol Use: No     Colonoscopy:  PAP:  Bone density:  Lipid panel:  Allergies  Allergen Reactions  . Cefuroxime Axetil     REACTION: Ddoes not remember reaction  . Sulfonamide Derivatives     REACTION: Rash    Current Outpatient Prescriptions  Medication Sig Dispense  Refill  . cloNIDine (CATAPRES) 0.1 MG tablet Take 1 tablet (0.1 mg total) by mouth 3 (three) times daily.  90 tablet  2  . losartan (COZAAR) 50 MG tablet Take 1 tablet (50 mg total) by mouth daily.  30 tablet  2  . warfarin (COUMADIN) 5 MG tablet Take 2.5-5 mg by mouth See admin instructions. Take 1 tablet on Tuesday, Wednesday, Thursday, Saturday and Sunday. Take 2.5 tablet on Monday and Friday.       No current facility-administered medications for this visit.    OBJECTIVE: Elderly white woman in no acute distress  Filed Vitals:   08/19/13 0958  BP: 168/84  Pulse: 63  Temp: 98.1 F (36.7 C)  Resp:  18     Body mass index is 29.24 kg/(m^2).    ECOG FS: 1  Sclerae unicteric, pupils round and equal Oropharynx clear and moist, teeth in good repair No cervical or supraclavicular adenopathy Lungs no rales or rhonchi Heart regular rate and rhythm Abd soft, nontender, positive bowel sounds MSK no focal spinal tenderness, bilateral ankle edema Neuro: non-focal, well-oriented, positive affect Breasts: The right breast is status post lumpectomy and radiation. There is no evidence of local recurrence. The right axilla is benign. Both nipples are minimally inverted. This is not a new finding The left breast is otherwise unremarkable   LAB RESULTS:  CMP     Component Value Date/Time   NA 141 08/12/2013 0831   NA 142 04/14/2013 0421   K 4.0 08/12/2013 0831   K 4.1 04/14/2013 0421   CL 104 04/14/2013 0421   CO2 25 08/12/2013 0831   CO2 27 04/14/2013 0421   GLUCOSE 115 08/12/2013 0831   GLUCOSE 116* 04/14/2013 0421   BUN 20.5 08/12/2013 0831   BUN 19 04/14/2013 0421   CREATININE 0.9 08/12/2013 0831   CREATININE 0.80 04/14/2013 0421   CALCIUM 9.5 08/12/2013 0831   CALCIUM 9.2 04/14/2013 0421   PROT 7.5 08/12/2013 0831   PROT 7.7 01/22/2013 0219   ALBUMIN 3.7 08/12/2013 0831   ALBUMIN 3.8 01/22/2013 0219   AST 17 08/12/2013 0831   AST 20 01/22/2013 0219   ALT 14 08/12/2013 0831   ALT 12 01/22/2013  0219   ALKPHOS 72 08/12/2013 0831   ALKPHOS 70 01/22/2013 0219   BILITOT 0.46 08/12/2013 0831   BILITOT 0.3 01/22/2013 0219   GFRNONAA 69* 04/14/2013 0421   GFRAA 80* 04/14/2013 0421    I No results found for this basename: SPEP,  UPEP,   kappa and lambda light chains    Lab Results  Component Value Date   WBC 7.2 08/12/2013   NEUTROABS 3.1 08/12/2013   HGB 13.8 08/12/2013   HCT 42.5 08/12/2013   MCV 90.4 08/12/2013   PLT 299 08/12/2013      Chemistry      Component Value Date/Time   NA 141 08/12/2013 0831   NA 142 04/14/2013 0421   K 4.0 08/12/2013 0831   K 4.1 04/14/2013 0421   CL 104 04/14/2013 0421   CO2 25 08/12/2013 0831   CO2 27 04/14/2013 0421   BUN 20.5 08/12/2013 0831   BUN 19 04/14/2013 0421   CREATININE 0.9 08/12/2013 0831   CREATININE 0.80 04/14/2013 0421      Component Value Date/Time   CALCIUM 9.5 08/12/2013 0831   CALCIUM 9.2 04/14/2013 0421   ALKPHOS 72 08/12/2013 0831   ALKPHOS 70 01/22/2013 0219   AST 17 08/12/2013 0831   AST 20 01/22/2013 0219   ALT 14 08/12/2013 0831   ALT 12 01/22/2013 0219   BILITOT 0.46 08/12/2013 0831   BILITOT 0.3 01/22/2013 0219       Lab Results  Component Value Date   LABCA2 19 07/12/2010    No components found with this basename: [id number]    No results found for this basename: INR,  in the last 168 hours  Urinalysis    Component Value Date/Time   COLORURINE YELLOW 01/22/2013 [place] 01/22/2013 0254   LABSPEC 1.009 01/22/2013 [place] 7.0 01/22/2013 [place] 01/22/2013 [place] 06/13/2011 [place] 01/22/2013 [place] 01/22/2013 0254  KETONESUR NEGATIVE 01/22/2013 0254   PROTEINUR NEGATIVE 01/22/2013 0254   UROBILINOGEN 0.2 01/22/2013 0254   NITRITE NEGATIVE 01/22/2013 0254   LEUKOCYTESUR NEGATIVE 01/22/2013 0254    STUDIES: Mm Digital Diagnostic Bilat  08/12/2013   CLINICAL DATA:  History of right lumpectomy for breast cancer  2010. Asymptomatic.  EXAM: DIGITAL DIAGNOSTIC  bilateral MAMMOGRAM WITH CAD  COMPARISON:  [facility]  ACR Breast Density Category b: There are scattered areas of fibroglandular density.  FINDINGS: Right lumpectomy changes are noted. No evidence for malignancy is identified in either breast.  Mammographic images were processed with CAD.  IMPRESSION: No evidence for malignancy.  RECOMMENDATION: Diagnostic mammogram is suggested in 1 year. (Code:[id number]-B-01Y)  I have discussed the findings and recommendations with the patient. Results were also provided in writing at the conclusion of the visit. If applicable, a reminder letter will be sent to the patient regarding the next appointment.  BI-RADS CATEGORY  2: Benign.   Electronically Signed   By: Conchita Paris M.D.   On: 08/12/2013 10:28    ASSESSMENT: 80 y.o. [place] woman   (1) status post right lumpectomy and sentinel lymph node sampling in July 2010 for a T1c N0, grade 1 invasive ductal carcinoma, strongly estrogen and progesterone receptor positive, HER2/neu negative, with low MIB-1.   (2) Status post adjuvant radiation therapy completed in August 2010.   (3) Since that time, she has tried both anastrozole and letrozole, unable to tolerate either medication due to joint pain.  Also tried exemestane between August 2012 and October 2012, also discontinued due to intolerance and joint pain. Not a tamoxifen candidate  (4) The patient is factor V Leiden positive and is on chronic coumadinization, with Coumadin followed by Cardiology.    PLAN: [name] Little is doing fine from a breast cancer point of view. She was unable to tolerate aromatase inhibitors and was not a candidate for tamoxifen. Nevertheless her overall prognosis is good, and now bad she is 5 years out from her original surgery I feel comfortable releasing her back to her primary care physician.  As far as breast cancer screening is concerned all she will need is yearly mammography, preferably with  tomography, and a yearly physician breast exam.  Naelani knows I will be glad to see her at any point in the future if the need arises, but as of now were making no further routine appointment for her here.    [name] Cruel, MD   08/19/2013 10:14 AM

## 2013-08-27 ENCOUNTER — Ambulatory Visit (INDEPENDENT_AMBULATORY_CARE_PROVIDER_SITE_OTHER): Payer: Medicare Other | Admitting: General Practice

## 2013-08-27 DIAGNOSIS — Z86718 Personal history of other venous thrombosis and embolism: Secondary | ICD-10-CM

## 2013-08-27 DIAGNOSIS — D682 Hereditary deficiency of other clotting factors: Secondary | ICD-10-CM

## 2013-08-27 DIAGNOSIS — Z5181 Encounter for therapeutic drug level monitoring: Secondary | ICD-10-CM

## 2013-08-27 LAB — POCT INR: INR: 2.8

## 2013-08-27 NOTE — Progress Notes (Signed)
Pre visit review using our clinic review tool, if applicable. No additional management support is needed unless otherwise documented below in the visit note. 

## 2013-09-22 ENCOUNTER — Ambulatory Visit (INDEPENDENT_AMBULATORY_CARE_PROVIDER_SITE_OTHER): Payer: Medicare Other | Admitting: Family Medicine

## 2013-09-22 ENCOUNTER — Telehealth: Payer: Self-pay | Admitting: Family Medicine

## 2013-09-22 ENCOUNTER — Encounter: Payer: Self-pay | Admitting: Family Medicine

## 2013-09-22 VITALS — BP 134/86 | HR 64 | Temp 98.6°F | Ht 63.0 in | Wt 167.0 lb

## 2013-09-22 DIAGNOSIS — F411 Generalized anxiety disorder: Secondary | ICD-10-CM | POA: Insufficient documentation

## 2013-09-22 DIAGNOSIS — E785 Hyperlipidemia, unspecified: Secondary | ICD-10-CM

## 2013-09-22 DIAGNOSIS — R002 Palpitations: Secondary | ICD-10-CM

## 2013-09-22 DIAGNOSIS — Z23 Encounter for immunization: Secondary | ICD-10-CM

## 2013-09-22 DIAGNOSIS — I10 Essential (primary) hypertension: Secondary | ICD-10-CM

## 2013-09-22 HISTORY — DX: Generalized anxiety disorder: F41.1

## 2013-09-22 MED ORDER — ESCITALOPRAM OXALATE 10 MG PO TABS: ORAL_TABLET | ORAL | Status: DC

## 2013-09-22 MED ORDER — ALPRAZOLAM 0.25 MG PO TABS: 0.2500 mg | ORAL_TABLET | Freq: Two times a day (BID) | ORAL | Status: DC | PRN

## 2013-09-22 NOTE — Patient Instructions (Signed)
Generalized Anxiety Disorder Generalized anxiety disorder (GAD) is a mental disorder. It interferes with life functions, including relationships, work, and school. GAD is different from normal anxiety, which everyone experiences at some point in their lives in response to specific life events and activities. Normal anxiety actually helps us prepare for and get through these life events and activities. Normal anxiety goes away after the event or activity is over.  GAD causes anxiety that is not necessarily related to specific events or activities. It also causes excess anxiety in proportion to specific events or activities. The anxiety associated with GAD is also difficult to control. GAD can vary from mild to severe. People with severe GAD can have intense waves of anxiety with physical symptoms (panic attacks).  SYMPTOMS The anxiety and worry associated with GAD are difficult to control. This anxiety and worry are related to many life events and activities and also occur more days than not for 6 months or longer. People with GAD also have three or more of the following symptoms (one or more in children):  Restlessness.   Fatigue.  Difficulty concentrating.   Irritability.  Muscle tension.  Difficulty sleeping or unsatisfying sleep. DIAGNOSIS GAD is diagnosed through an assessment by your health care provider. Your health care provider will ask you questions aboutyour mood,physical symptoms, and events in your life. Your health care provider may ask you about your medical history and use of alcohol or drugs, including prescription medicines. Your health care provider may also do a physical exam and blood tests. Certain medical conditions and the use of certain substances can cause symptoms similar to those associated with GAD. Your health care provider may refer you to a mental health specialist for further evaluation. TREATMENT The following therapies are usually used to treat GAD:    Medication. Antidepressant medication usually is prescribed for long-term daily control. Antianxiety medicines may be added in severe cases, especially when panic attacks occur.   Talk therapy (psychotherapy). Certain types of talk therapy can be helpful in treating GAD by providing support, education, and guidance. A form of talk therapy called cognitive behavioral therapy can teach you healthy ways to think about and react to daily life events and activities.  Stress managementtechniques. These include yoga, meditation, and exercise and can be very helpful when they are practiced regularly. A mental health specialist can help determine which treatment is best for you. Some people see improvement with one therapy. However, other people require a combination of therapies. Document Released: 05/13/2012 Document Revised: 06/02/2013 Document Reviewed: 05/13/2012 ExitCare Patient Information 2015 [employer]. This information is not intended to replace advice given to you by your health care provider. Make sure you discuss any questions you have with your health care provider.  

## 2013-09-22 NOTE — Progress Notes (Signed)
Pre visit review using our clinic review tool, if applicable. No additional management support is needed unless otherwise documented below in the visit note. 

## 2013-09-22 NOTE — Assessment & Plan Note (Signed)
Encouraged heart healthy diet, increase exercise, avoid trans fats, consider a krill oil cap daily 

## 2013-09-22 NOTE — Telephone Encounter (Signed)
Relevant patient education assigned to patient using Emmi. ° °

## 2013-09-22 NOTE — Assessment & Plan Note (Signed)
Struggles when her anxiety is up. No symptoms today

## 2013-09-22 NOTE — Assessment & Plan Note (Signed)
Improved with recheck 

## 2013-09-22 NOTE — Progress Notes (Signed)
Patient ID: [name] Little, female   DOB: [birth year], 80 y.o.   MRN: [id number] ERIKO ECONOMOS [id number] September 27, 1933 09/22/2013      Progress Note-Follow Up  Subjective  Chief Complaint  Chief Complaint  Patient presents with  . Follow-up    3 month  . Injections    today    HPI  Patient is a 80 year old female in today for routine medical care. Patient notes increased stress as her only concern. She noticed that she is worrying obsessively and has palpitations shortness or breath and tremulousness when she gets over anxious. His ready to start a medication. There's been no new specific trigger. Declines flu shot today. No acute illness. Denies CP/palp/SOB/HA/congestion/fevers/GI or GU c/o. Taking meds as prescribed  Past Medical History  Diagnosis Date  . Personal history of colonic polyps     hyperplastic  . Hemorrhoids   . Thoracic aortic aneurysm   . Paroxysmal supraventricular tachycardia   . Factor V deficiency   . TMJ pain dysfunction syndrome   . DVT (deep venous thrombosis)   . Hyperlipemia   . Blood clotting disorder     V5 factor  . Clostridium difficile infection   . Hypertension   . Infiltrating ductal carcinoma of breast     stage one right   . Blood dyscrasia     factor 5 deficiency  . Arthritis     lumbar stenosis, radiculopathy, OA- L hip  . Clotting disorder   . Anxiety state, unspecified 09/22/2013    Past Surgical History  Procedure Laterality Date  . Total hip arthroplasty  1999    right  . Laparoscopic hysterectomy    . Splenectomy    . Right calf surgery      for cancer  . Colonoscopy    . Upper gastrointestinal endoscopy    . Joint replacement      R hip replacement   . Breast lumpectomy  2010    right Dr. [name]  . Abdominal hysterectomy      1960- ? vaginal hysterectomy, not laparoscopic   . Eye surgery      bilateral cataracts- /w IOL   . Lumbar laminectomy/decompression microdiscectomy  04/11/2011    Procedure: LUMBAR  LAMINECTOMY/DECOMPRESSION MICRODISCECTOMY;  Surgeon: [name], MD;  Location: [place] [facility];  Service: Neurosurgery;  Laterality: N/A;  Lumbar Five-Sacral One Microdiscectomy    Family History  Problem Relation Age of Onset  . Heart disease Mother   . Heart disease Father   . Heart disease      uncles  . Clotting disorder Maternal Uncle   . Breast cancer Paternal Aunt   . Cancer Paternal Aunt     breast  . Colon cancer Neg Hx   . Anesthesia problems Neg Hx   . Hypotension Neg Hx   . Malignant hyperthermia Neg Hx   . Pseudochol deficiency Neg Hx     History   Social History  . Marital Status: Married    Spouse Name: N/A    Number of Children: 4  . Years of Education: N/A   Occupational History  . Retired    Social History Main Topics  . Smoking status: Never Smoker   . Smokeless tobacco: Never Used  . Alcohol Use: No  . Drug Use: No  . Sexual Activity: Not on file   Other Topics Concern  . Not on file   Social History Narrative   Married 1955   2 sons - '59, '  13, 2 daughters '55, '57; 8 grandchildren; 1 great grand   I-ADLs          Current Outpatient Prescriptions on File Prior to Visit  Medication Sig Dispense Refill  . cloNIDine (CATAPRES) 0.1 MG tablet Take 1 tablet (0.1 mg total) by mouth 3 (three) times daily.  90 tablet  2  . losartan (COZAAR) 50 MG tablet Take 1 tablet (50 mg total) by mouth daily.  30 tablet  2  . warfarin (COUMADIN) 5 MG tablet Take 2.5-5 mg by mouth See admin instructions. Take 1 tablet on Tuesday, Wednesday, Thursday, Saturday and Sunday. Take 2.5 tablet on Monday and Friday.       No current facility-administered medications on file prior to visit.    Allergies  Allergen Reactions  . Cefuroxime Axetil     REACTION: Ddoes not remember reaction  . Statins     weakness  . Sulfonamide Derivatives     REACTION: Rash    Review of Systems  Review of Systems  Constitutional: Negative for fever and malaise/fatigue.  HENT:  Negative for congestion.   Eyes: Negative for discharge.  Respiratory: Negative for shortness of breath.   Cardiovascular: Negative for chest pain, palpitations and leg swelling.  Gastrointestinal: Negative for nausea, abdominal pain and diarrhea.  Genitourinary: Negative for dysuria.  Musculoskeletal: Negative for falls.  Skin: Negative for rash.  Neurological: Negative for loss of consciousness and headaches.  Endo/Heme/Allergies: Negative for polydipsia.  Psychiatric/Behavioral: Negative for depression and suicidal ideas. The patient is nervous/anxious. The patient does not have insomnia.     Objective  BP 134/86  Pulse 64  Temp(Src) 98.6 F (37 C) (Oral)  Ht 5\' 3"  (1.6 m)  Wt 167 lb 0.6 oz (75.769 kg)  BMI 29.60 kg/m2  SpO2 64%  Physical Exam  Physical Exam  Constitutional: She is oriented to person, place, and time and well-developed, well-nourished, and in no distress. No distress.  HENT:  Head: Normocephalic and atraumatic.  Eyes: Conjunctivae are normal.  Neck: Neck supple. No thyromegaly present.  Cardiovascular: Normal rate, regular rhythm and normal heart sounds.   No murmur heard. Pulmonary/Chest: Effort normal and breath sounds normal. She has no wheezes.  Abdominal: She exhibits no distension and no mass.  Musculoskeletal: She exhibits no edema.  Lymphadenopathy:    She has no cervical adenopathy.  Neurological: She is alert and oriented to person, place, and time.  Skin: Skin is warm and dry. No rash noted. She is not diaphoretic.  Psychiatric: Memory, affect and judgment normal.    Lab Results  Component Value Date   TSH 4.18 04/01/2013   Lab Results  Component Value Date   WBC 7.2 08/12/2013   HGB 13.8 08/12/2013   HCT 42.5 08/12/2013   MCV 90.4 08/12/2013   PLT 299 08/12/2013   Lab Results  Component Value Date   CREATININE 0.9 08/12/2013   BUN 20.5 08/12/2013   NA 141 08/12/2013   K 4.0 08/12/2013   CL 104 04/14/2013   CO2 25 08/12/2013   Lab  Results  Component Value Date   ALT 14 08/12/2013   AST 17 08/12/2013   ALKPHOS 72 08/12/2013   BILITOT 0.46 08/12/2013   Lab Results  Component Value Date   CHOL 228* 04/01/2013   Lab Results  Component Value Date   HDL 38.90* 04/01/2013   Lab Results  Component Value Date   LDLCALC 150* 04/01/2013   Lab Results  Component Value Date   TRIG 195.0*  04/01/2013   Lab Results  Component Value Date   CHOLHDL 6 04/01/2013     Assessment & Plan  Anxiety state, unspecified Mother with similar history. Is ready to start a medication due to her over reaction to small stressors. Start Lexapro and Alprazolam prn  Unspecified essential hypertension Improved with recheck  PALPITATIONS Struggles when her anxiety is up. No symptoms today  Other and unspecified hyperlipidemia Encouraged heart healthy diet, increase exercise, avoid trans fats, consider a krill oil cap daily

## 2013-09-22 NOTE — Assessment & Plan Note (Addendum)
Mother with similar history. Is ready to start a medication due to her over reaction to small stressors. Start Lexapro and Alprazolam prn

## 2013-09-23 ENCOUNTER — Other Ambulatory Visit: Payer: Self-pay | Admitting: Family Medicine

## 2013-09-23 NOTE — Telephone Encounter (Signed)
Please advise refill? Last RX was done on 05-21-13 quantity 90 with 2 refills  If ok fax to [phone]

## 2013-10-07 ENCOUNTER — Ambulatory Visit (INDEPENDENT_AMBULATORY_CARE_PROVIDER_SITE_OTHER): Payer: Medicare Other | Admitting: *Deleted

## 2013-10-07 DIAGNOSIS — Z5181 Encounter for therapeutic drug level monitoring: Secondary | ICD-10-CM

## 2013-10-07 DIAGNOSIS — D682 Hereditary deficiency of other clotting factors: Secondary | ICD-10-CM

## 2013-10-07 DIAGNOSIS — Z86718 Personal history of other venous thrombosis and embolism: Secondary | ICD-10-CM

## 2013-10-07 LAB — POCT INR: INR: 3.1

## 2013-11-04 ENCOUNTER — Ambulatory Visit: Payer: Medicare Other | Admitting: Family Medicine

## 2013-11-19 ENCOUNTER — Ambulatory Visit (INDEPENDENT_AMBULATORY_CARE_PROVIDER_SITE_OTHER): Payer: Medicare Other | Admitting: *Deleted

## 2013-11-19 ENCOUNTER — Other Ambulatory Visit (INDEPENDENT_AMBULATORY_CARE_PROVIDER_SITE_OTHER): Payer: Medicare Other

## 2013-11-19 DIAGNOSIS — D682 Hereditary deficiency of other clotting factors: Secondary | ICD-10-CM

## 2013-11-19 DIAGNOSIS — I10 Essential (primary) hypertension: Secondary | ICD-10-CM

## 2013-11-19 DIAGNOSIS — E785 Hyperlipidemia, unspecified: Secondary | ICD-10-CM

## 2013-11-19 DIAGNOSIS — Z5181 Encounter for therapeutic drug level monitoring: Secondary | ICD-10-CM

## 2013-11-19 DIAGNOSIS — Z86718 Personal history of other venous thrombosis and embolism: Secondary | ICD-10-CM

## 2013-11-19 DIAGNOSIS — F411 Generalized anxiety disorder: Secondary | ICD-10-CM

## 2013-11-19 LAB — CBC WITH DIFFERENTIAL/PLATELET
Basophils Absolute: 0.1 10*3/uL (ref 0.0–0.1)
Basophils Relative: 0.9 % (ref 0.0–3.0)
Eosinophils Absolute: 0.2 10*3/uL (ref 0.0–0.7)
Eosinophils Relative: 2.8 % (ref 0.0–5.0)
HCT: 45.1 % (ref 36.0–46.0)
Hemoglobin: 14.6 g/dL (ref 12.0–15.0)
Lymphocytes Relative: 44.6 % (ref 12.0–46.0)
Lymphs Abs: 3.7 10*3/uL (ref 0.7–4.0)
MCHC: 32.2 g/dL (ref 30.0–36.0)
MCV: 90.4 fl (ref 78.0–100.0)
Monocytes Absolute: 0.9 10*3/uL (ref 0.1–1.0)
Monocytes Relative: 11 % (ref 3.0–12.0)
Neutro Abs: 3.3 10*3/uL (ref 1.4–7.7)
Neutrophils Relative %: 40.7 % — ABNORMAL LOW (ref 43.0–77.0)
Platelets: 313 10*3/uL (ref 150.0–400.0)
RBC: 4.99 Mil/uL (ref 3.87–5.11)
RDW: 14.5 % (ref 11.5–15.5)
WBC: 8.2 10*3/uL (ref 4.0–10.5)

## 2013-11-19 LAB — POCT INR: INR: 2.7

## 2013-11-19 LAB — TSH: TSH: 1.74 u[IU]/mL (ref 0.35–4.50)

## 2013-11-20 LAB — LIPID PANEL
Cholesterol: 214 mg/dL — ABNORMAL HIGH (ref 0–200)
HDL: 41.1 mg/dL (ref >39.00)
LDL Cholesterol: 147 mg/dL — ABNORMAL HIGH (ref 0–99)
NonHDL: 172.9
Total CHOL/HDL Ratio: 5
Triglycerides: 130 mg/dL (ref 0.0–149.0)
VLDL: 26 mg/dL (ref 0.0–40.0)

## 2013-11-20 LAB — HEPATIC FUNCTION PANEL
ALT: 14 U/L (ref 0–35)
AST: 19 U/L (ref 0–37)
Albumin: 3.7 g/dL (ref 3.5–5.2)
Alkaline Phosphatase: 64 U/L (ref 39–117)
Bilirubin, Direct: 0 mg/dL (ref 0.0–0.3)
Total Bilirubin: 0.8 mg/dL (ref 0.2–1.2)
Total Protein: 8.1 g/dL (ref 6.0–8.3)

## 2013-11-20 LAB — RENAL FUNCTION PANEL
Albumin: 3.7 g/dL (ref 3.5–5.2)
BUN: 22 mg/dL (ref 6–23)
CO2: 29 mEq/L (ref 19–32)
Calcium: 9.3 mg/dL (ref 8.4–10.5)
Chloride: 105 mEq/L (ref 96–112)
Creatinine, Ser: 0.8 mg/dL (ref 0.4–1.2)
GFR: 70.68 mL/min (ref >60.00)
Glucose, Bld: 95 mg/dL (ref 70–99)
Phosphorus: 3 mg/dL (ref 2.3–4.6)
Potassium: 4.6 mEq/L (ref 3.5–5.1)
Sodium: 141 mEq/L (ref 135–145)

## 2013-11-21 ENCOUNTER — Ambulatory Visit (INDEPENDENT_AMBULATORY_CARE_PROVIDER_SITE_OTHER): Payer: Medicare Other | Admitting: Family Medicine

## 2013-11-21 ENCOUNTER — Encounter: Payer: Self-pay | Admitting: Family Medicine

## 2013-11-21 VITALS — BP 153/65 | HR 67 | Temp 97.8°F | Ht 63.0 in | Wt 169.2 lb

## 2013-11-21 DIAGNOSIS — Z7901 Long term (current) use of anticoagulants: Secondary | ICD-10-CM

## 2013-11-21 DIAGNOSIS — E785 Hyperlipidemia, unspecified: Secondary | ICD-10-CM

## 2013-11-21 DIAGNOSIS — I10 Essential (primary) hypertension: Secondary | ICD-10-CM

## 2013-11-21 DIAGNOSIS — I471 Supraventricular tachycardia: Secondary | ICD-10-CM

## 2013-11-21 MED ORDER — LOSARTAN POTASSIUM 100 MG PO TABS: 100.0000 mg | ORAL_TABLET | Freq: Every day | ORAL | Status: DC

## 2013-11-21 MED ORDER — FUROSEMIDE 20 MG PO TABS: 20.0000 mg | ORAL_TABLET | Freq: Every day | ORAL | Status: DC | PRN

## 2013-11-21 NOTE — Patient Instructions (Signed)
Start a krill oil cap such as Mega Red or a generic daily Start a fiber supplement such as Metamucil or Benefiber  Daily Avoid trans fats, partially hydrogenated oils   Cholesterol Cholesterol is a white, waxy, fat-like substance needed by your body in small amounts. The liver makes all the cholesterol you need. Cholesterol is carried from the liver by the blood through the blood vessels. Deposits of cholesterol (plaque) may build up on blood vessel walls. These make the arteries narrower and stiffer. Cholesterol plaques increase the risk for heart attack and stroke.  You cannot feel your cholesterol level even if it is very high. The only way to know it is high is with a blood test. Once you know your cholesterol levels, you should keep a record of the test results. Work with your health care provider to keep your levels in the desired range.  WHAT DO THE RESULTS MEAN?  Total cholesterol is a rough measure of all the cholesterol in your blood.   LDL is the so-called bad cholesterol. This is the type that deposits cholesterol in the walls of the arteries. You want this level to be low.   HDL is the good cholesterol because it cleans the arteries and carries the LDL away. You want this level to be high.  Triglycerides are fat that the body can either burn for energy or store. High levels are closely linked to heart disease.  WHAT ARE THE DESIRED LEVELS OF CHOLESTEROL?  Total cholesterol below 200.   LDL below 100 for people at risk, below 70 for those at very high risk.   HDL above 50 is good, above 60 is best.   Triglycerides below 150.  HOW CAN I LOWER MY CHOLESTEROL?  Diet. Follow your diet programs as directed by your health care provider.   Choose fish or white meat chicken and [place], roasted or baked. Limit fatty cuts of red meat, fried foods, and processed meats, such as sausage and lunch meats.   Eat lots of fresh fruits and vegetables.  Choose whole grains, beans,  pasta, potatoes, and cereals.   Use only small amounts of olive, corn, or canola oils.   Avoid butter, mayonnaise, shortening, or palm kernel oils.  Avoid foods with trans fats.   Drink skim or nonfat milk and eat low-fat or nonfat yogurt and cheeses. Avoid whole milk, cream, ice cream, egg yolks, and full-fat cheeses.   Healthy desserts include angel food cake, ginger snaps, animal crackers, hard candy, popsicles, and low-fat or nonfat frozen yogurt. Avoid pastries, cakes, pies, and cookies.   Exercise. Follow your exercise programs as directed by your health care provider.   A regular program helps decrease LDL and raise HDL.   A regular program helps with weight control.   Do things that increase your activity level like gardening, walking, or taking the stairs. Ask your health care provider about how you can be more active in your daily life.   Medicine. Take medicine only as directed by your health care provider.   Medicine may be prescribed by your health care provider to help lower cholesterol and decrease the risk for heart disease.   If you have several risk factors, you may need medicine even if your levels are normal. Document Released: 10/11/2000 Document Revised: 06/02/2013 Document Reviewed: 10/30/2012 [facility] Patient Information 2015 [place], [place]. This information is not intended to replace advice given to you by your health care provider. Make sure you discuss any questions you have with  your health care provider.

## 2013-11-21 NOTE — Progress Notes (Signed)
Pre visit review using our clinic review tool, if applicable. No additional management support is needed unless otherwise documented below in the visit note. 

## 2013-11-23 NOTE — Assessment & Plan Note (Signed)
Encouraged heart healthy diet, increase exercise, avoid trans fats, consider a krill oil cap daily. Declines meds

## 2013-11-23 NOTE — Assessment & Plan Note (Signed)
RRR today 

## 2013-11-23 NOTE — Assessment & Plan Note (Signed)
Tolerating coumadin, follows with coumadin clinic

## 2013-11-23 NOTE — Assessment & Plan Note (Signed)
Poorly controlled will alter medications, encouraged DASH diet, minimize caffeine and obtain adequate sleep. Report concerning symptoms and follow up as directed and as needed. Increase losartan to 100 mg daly

## 2013-11-24 NOTE — Progress Notes (Signed)
Patient ID: [name] Little, female   DOB: May 11, 1933, 80 y.o.   MRN: [id number] [name] Little [id number] October 03, 1933 11/24/2013      Progress Note-Follow Up  Subjective  Chief Complaint  Chief Complaint  Patient presents with  . Follow-up    2 month    HPI  Patient is a 80 year old female in today for routine medical care. Doing well. No recent illness or acute concerns. Does note some increased pedal edema in l>r lately. No change in activity, no pain, no change in diet. Notes systolic bp 573-220. Denies CP/palp/SOB/HA/congestion/fevers/GI or GU c/o. Taking meds as prescribed  Past Medical History  Diagnosis Date  . Personal history of colonic polyps     hyperplastic  . Hemorrhoids   . Thoracic aortic aneurysm   . Paroxysmal supraventricular tachycardia   . Factor V deficiency   . TMJ pain dysfunction syndrome   . DVT (deep venous thrombosis)   . Hyperlipemia   . Blood clotting disorder     V5 factor  . Clostridium difficile infection   . Hypertension   . Infiltrating ductal carcinoma of breast     stage one right   . Blood dyscrasia     factor 5 deficiency  . Arthritis     lumbar stenosis, radiculopathy, OA- L hip  . Clotting disorder   . Anxiety state, unspecified 09/22/2013    Past Surgical History  Procedure Laterality Date  . Total hip arthroplasty  1999    right  . Laparoscopic hysterectomy    . Splenectomy    . Right calf surgery      for cancer  . Colonoscopy    . Upper gastrointestinal endoscopy    . Joint replacement      R hip replacement   . Breast lumpectomy  2010    right Dr. [name]  . Abdominal hysterectomy      1960- ? vaginal hysterectomy, not laparoscopic   . Eye surgery      bilateral cataracts- /w IOL   . Lumbar laminectomy/decompression microdiscectomy  04/11/2011    Procedure: LUMBAR LAMINECTOMY/DECOMPRESSION MICRODISCECTOMY;  Surgeon: [name], MD;  Location: [facility];  Service: Neurosurgery;  Laterality: N/A;  Lumbar  Five-Sacral One Microdiscectomy    Family History  Problem Relation Age of Onset  . Heart disease Mother   . Heart disease Father   . Heart disease      uncles  . Clotting disorder Maternal Uncle   . Breast cancer Paternal Aunt   . Cancer Paternal Aunt     breast  . Colon cancer Neg Hx   . Anesthesia problems Neg Hx   . Hypotension Neg Hx   . Malignant hyperthermia Neg Hx   . Pseudochol deficiency Neg Hx     History   Social History  . Marital Status: Married    Spouse Name: N/A    Number of Children: 4  . Years of Education: N/A   Occupational History  . Retired    Social History Main Topics  . Smoking status: Never Smoker   . Smokeless tobacco: Never Used  . Alcohol Use: No  . Drug Use: No  . Sexual Activity: Not on file   Other Topics Concern  . Not on file   Social History Narrative   Married 1955   2 sons - '59, '61, 2 daughters '55, '57; 8 grandchildren; 1 great grand   I-ADLs          Current  Outpatient Prescriptions on File Prior to Visit  Medication Sig Dispense Refill  . ALPRAZolam (XANAX) 0.25 MG tablet Take 1 tablet (0.25 mg total) by mouth 2 (two) times daily as needed for anxiety.  10 tablet  1  . cloNIDine (CATAPRES) 0.1 MG tablet TAKE 1 TABLET (0.1 MG TOTAL) BY MOUTH 3 (THREE) TIMES DAILY.  90 tablet  2  . escitalopram (LEXAPRO) 10 MG tablet 1/2 tab po daily x 7 days then 1 tab po daily  30 tablet  3  . warfarin (COUMADIN) 5 MG tablet Take 2.5-5 mg by mouth See admin instructions. Take 1 tablet on Tuesday, Wednesday, Thursday, Saturday and Sunday. Take 2.5 tablet on Monday and Friday.       No current facility-administered medications on file prior to visit.    Allergies  Allergen Reactions  . Cefuroxime Axetil     REACTION: Ddoes not remember reaction  . Statins     Weakness, pain Lipitor and Pravachol  . Sulfonamide Derivatives     REACTION: Rash    Review of Systems  Review of Systems  Constitutional: Negative for fever and  malaise/fatigue.  HENT: Negative for congestion.   Eyes: Negative for discharge.  Respiratory: Negative for shortness of breath.   Cardiovascular: Negative for chest pain, palpitations and leg swelling.  Gastrointestinal: Negative for nausea, abdominal pain and diarrhea.  Genitourinary: Negative for dysuria.  Musculoskeletal: Negative for falls.  Skin: Negative for rash.  Neurological: Negative for loss of consciousness and headaches.  Endo/Heme/Allergies: Negative for polydipsia.  Psychiatric/Behavioral: Negative for depression and suicidal ideas. The patient is not nervous/anxious and does not have insomnia.     Objective  BP 153/65  Pulse 67  Temp(Src) 97.8 F (36.6 C) (Oral)  Ht 5\' 3"  (1.6 m)  Wt 169 lb 3.2 oz (76.749 kg)  BMI 29.98 kg/m2  SpO2 96%  Physical Exam  Physical Exam  Constitutional: She is oriented to person, place, and time and well-developed, well-nourished, and in no distress. No distress.  HENT:  Head: Normocephalic and atraumatic.  Eyes: Conjunctivae are normal.  Neck: Neck supple. No thyromegaly present.  Cardiovascular: Normal rate, regular rhythm and normal heart sounds.   Pulmonary/Chest: Effort normal and breath sounds normal. She has no wheezes.  Abdominal: She exhibits no distension and no mass.  Musculoskeletal: She exhibits edema.  1-2 + pedal edema b/l LE  Lymphadenopathy:    She has no cervical adenopathy.  Neurological: She is alert and oriented to person, place, and time.  Skin: Skin is warm and dry. No rash noted. She is not diaphoretic.  Psychiatric: Memory, affect and judgment normal.    Lab Results  Component Value Date   TSH 1.74 11/19/2013   Lab Results  Component Value Date   WBC 8.2 11/19/2013   HGB 14.6 11/19/2013   HCT 45.1 11/19/2013   MCV 90.4 11/19/2013   PLT 313.0 11/19/2013   Lab Results  Component Value Date   CREATININE 0.8 11/19/2013   BUN 22 11/19/2013   NA 141 11/19/2013   K 4.6 11/19/2013   CL 105  11/19/2013   CO2 29 11/19/2013   Lab Results  Component Value Date   ALT 14 11/19/2013   AST 19 11/19/2013   ALKPHOS 64 11/19/2013   BILITOT 0.8 11/19/2013   Lab Results  Component Value Date   CHOL 214* 11/19/2013   Lab Results  Component Value Date   HDL 41.10 11/19/2013   Lab Results  Component Value Date  [place] 147* 11/19/2013   Lab Results  Component Value Date   TRIG 130.0 11/19/2013   Lab Results  Component Value Date   CHOLHDL 5 11/19/2013     Assessment & Plan  Hyperlipidemia Encouraged heart healthy diet, increase exercise, avoid trans fats, consider a krill oil cap daily. Declines meds  Anticoagulant long-term use Tolerating coumadin, follows with coumadin clinic  Essential hypertension Poorly controlled will alter medications, encouraged DASH diet, minimize caffeine and obtain adequate sleep. Report concerning symptoms and follow up as directed and as needed. Increase losartan to 100 mg daly  PAROXYSMAL SUPRAVENTRICULAR TACHYCARDIA RRR today

## 2013-11-26 ENCOUNTER — Encounter (HOSPITAL_BASED_OUTPATIENT_CLINIC_OR_DEPARTMENT_OTHER): Payer: Self-pay | Admitting: Emergency Medicine

## 2013-11-26 ENCOUNTER — Emergency Department (HOSPITAL_BASED_OUTPATIENT_CLINIC_OR_DEPARTMENT_OTHER)
Admission: EM | Admit: 2013-11-26 | Discharge: 2013-11-26 | Disposition: A | Discharge status: Home / Self Care | Payer: Medicare Other | Attending: Emergency Medicine | Admitting: Emergency Medicine

## 2013-11-26 ENCOUNTER — Telehealth: Payer: Self-pay

## 2013-11-26 DIAGNOSIS — Z853 Personal history of malignant neoplasm of breast: Secondary | ICD-10-CM | POA: Insufficient documentation

## 2013-11-26 DIAGNOSIS — S60212A Contusion of left wrist, initial encounter: Secondary | ICD-10-CM | POA: Insufficient documentation

## 2013-11-26 DIAGNOSIS — Z8639 Personal history of other endocrine, nutritional and metabolic disease: Secondary | ICD-10-CM | POA: Insufficient documentation

## 2013-11-26 DIAGNOSIS — Z8619 Personal history of other infectious and parasitic diseases: Secondary | ICD-10-CM | POA: Insufficient documentation

## 2013-11-26 DIAGNOSIS — Z86718 Personal history of other venous thrombosis and embolism: Secondary | ICD-10-CM | POA: Diagnosis not present

## 2013-11-26 DIAGNOSIS — F419 Anxiety disorder, unspecified: Secondary | ICD-10-CM | POA: Insufficient documentation

## 2013-11-26 DIAGNOSIS — Y929 Unspecified place or not applicable: Secondary | ICD-10-CM | POA: Insufficient documentation

## 2013-11-26 DIAGNOSIS — Y9389 Activity, other specified: Secondary | ICD-10-CM | POA: Insufficient documentation

## 2013-11-26 DIAGNOSIS — I10 Essential (primary) hypertension: Secondary | ICD-10-CM | POA: Insufficient documentation

## 2013-11-26 DIAGNOSIS — Z7901 Long term (current) use of anticoagulants: Secondary | ICD-10-CM | POA: Insufficient documentation

## 2013-11-26 DIAGNOSIS — Z79899 Other long term (current) drug therapy: Secondary | ICD-10-CM | POA: Insufficient documentation

## 2013-11-26 DIAGNOSIS — X58XXXA Exposure to other specified factors, initial encounter: Secondary | ICD-10-CM | POA: Diagnosis not present

## 2013-11-26 DIAGNOSIS — Z862 Personal history of diseases of the blood and blood-forming organs and certain disorders involving the immune mechanism: Secondary | ICD-10-CM | POA: Insufficient documentation

## 2013-11-26 DIAGNOSIS — Z86010 Personal history of colonic polyps: Secondary | ICD-10-CM | POA: Insufficient documentation

## 2013-11-26 DIAGNOSIS — M7981 Nontraumatic hematoma of soft tissue: Secondary | ICD-10-CM | POA: Diagnosis not present

## 2013-11-26 DIAGNOSIS — T148XXA Other injury of unspecified body region, initial encounter: Secondary | ICD-10-CM

## 2013-11-26 LAB — BASIC METABOLIC PANEL
Anion gap: 13 (ref 5–15)
BUN: 17 mg/dL (ref 6–23)
CO2: 25 mEq/L (ref 19–32)
Calcium: 9.6 mg/dL (ref 8.4–10.5)
Chloride: 105 mEq/L (ref 96–112)
Creatinine, Ser: 0.7 mg/dL (ref 0.50–1.10)
GFR calc Af Amer: >90 mL/min (ref >90)
GFR calc non Af Amer: 81 mL/min — ABNORMAL LOW (ref >90)
Glucose, Bld: 114 mg/dL — ABNORMAL HIGH (ref 70–99)
Potassium: 4.3 mEq/L (ref 3.7–5.3)
Sodium: 143 mEq/L (ref 137–147)

## 2013-11-26 LAB — CBC WITH DIFFERENTIAL/PLATELET
Basophils Absolute: 0.1 10*3/uL (ref 0.0–0.1)
Basophils Relative: 1 % (ref 0–1)
Eosinophils Absolute: 0.1 10*3/uL (ref 0.0–0.7)
Eosinophils Relative: 2 % (ref 0–5)
HCT: 44.4 % (ref 36.0–46.0)
Hemoglobin: 14.4 g/dL (ref 12.0–15.0)
Lymphocytes Relative: 37 % (ref 12–46)
Lymphs Abs: 2.6 10*3/uL (ref 0.7–4.0)
MCH: 29.7 pg (ref 26.0–34.0)
MCHC: 32.4 g/dL (ref 30.0–36.0)
MCV: 91.5 fL (ref 78.0–100.0)
Monocytes Absolute: 0.9 10*3/uL (ref 0.1–1.0)
Monocytes Relative: 13 % — ABNORMAL HIGH (ref 3–12)
Neutro Abs: 3.4 10*3/uL (ref 1.7–7.7)
Neutrophils Relative %: 47 % (ref 43–77)
Platelets: 332 10*3/uL (ref 150–400)
RBC: 4.85 MIL/uL (ref 3.87–5.11)
RDW: 15 % (ref 11.5–15.5)
WBC: 7.1 10*3/uL (ref 4.0–10.5)

## 2013-11-26 LAB — PROTIME-INR
INR: 2.4 — ABNORMAL HIGH (ref 0.00–1.49)
Prothrombin Time: 26.2 seconds — ABNORMAL HIGH (ref 11.6–15.2)

## 2013-11-26 NOTE — Discharge Instructions (Signed)
Contusion °A contusion is a deep bruise. Contusions are the result of an injury that caused bleeding under the skin. The contusion may turn blue, purple, or yellow. Minor injuries will give you a painless contusion, but more severe contusions may stay painful and swollen for a few weeks.  °CAUSES  °A contusion is usually caused by a blow, trauma, or direct force to an area of the body. °SYMPTOMS  °· Swelling and redness of the injured area. °· Bruising of the injured area. °· Tenderness and soreness of the injured area. °· Pain. °DIAGNOSIS  °The diagnosis can be made by taking a history and physical exam. An X-ray, CT scan, or MRI may be needed to determine if there were any associated injuries, such as fractures. °TREATMENT  °Specific treatment will depend on what area of the body was injured. In general, the best treatment for a contusion is resting, icing, elevating, and applying cold compresses to the injured area. Over-the-counter medicines may also be recommended for pain control. Ask your caregiver what the best treatment is for your contusion. °HOME CARE INSTRUCTIONS  °· Put ice on the injured area. °¨ Put ice in a plastic bag. °¨ Place a towel between your skin and the bag. °¨ Leave the ice on for 15-20 minutes, 3-4 times a day, or as directed by your health care provider. °· Only take over-the-counter or prescription medicines for pain, discomfort, or fever as directed by your caregiver. Your caregiver may recommend avoiding anti-inflammatory medicines (aspirin, ibuprofen, and naproxen) for 48 hours because these medicines may increase bruising. °· Rest the injured area. °· If possible, elevate the injured area to reduce swelling. °SEEK IMMEDIATE MEDICAL CARE IF:  °· You have increased bruising or swelling. °· You have pain that is getting worse. °· Your swelling or pain is not relieved with medicines. °MAKE SURE YOU:  °· Understand these instructions. °· Will watch your condition. °· Will get help right  away if you are not doing well or get worse. °Document Released: 10/26/2004 Document Revised: 01/21/2013 Document Reviewed: 11/21/2010 °[employer]® Patient Information ©2015 [employer]. This information is not intended to replace advice given to you by your health care provider. Make sure you discuss any questions you have with your health care provider. ° °

## 2013-11-26 NOTE — Telephone Encounter (Signed)
Patient called and stated that her left hand is very swollen and she is unable to close her fingers into the palm.. She states that she has been having pain in her wrist for several days and now has the appearance of "blue color". Patient was ask how far she is from our office. Patient is less than 10 minutes. Was advised to come directly to [facility] and check in to the Emergency Department. Patient verbalizes understanding and states that she will be on her way.

## 2013-11-26 NOTE — ED Notes (Signed)
Family at bedside. 

## 2013-11-26 NOTE — ED Notes (Signed)
Pt reports bruising x 2 days that started on left wrist and now has now moved up to left thumb. Denies injuring wrist. Pt concerned that it could be a clot. Pt has history of blood clots.

## 2013-11-26 NOTE — ED Provider Notes (Signed)
CSN: [id number]     Arrival date & time 11/26/13  4008 History   First MD Initiated Contact with Patient 11/26/13 1007     Chief Complaint  Patient presents with  . Bleeding/Bruising    left palmer hand and thumb     (Consider location/radiation/quality/duration/timing/severity/associated sxs/prior Treatment) Patient is a 80 y.o. female presenting with general illness.  Illness Location:  L wrist Quality:  Bruising Severity:  Mild Onset quality:  Gradual Duration:  2 days Timing:  Constant Progression:  Worsening Chronicity:  New Context:  After doing yardwork.  On coumadin Relieved by:  Nothing Worsened by:  Nothing Associated symptoms: no abdominal pain, no cough, no fever, no nausea, no shortness of breath and no vomiting     Past Medical History  Diagnosis Date  . Personal history of colonic polyps     hyperplastic  . Hemorrhoids   . Thoracic aortic aneurysm   . Paroxysmal supraventricular tachycardia   . Factor V deficiency   . TMJ pain dysfunction syndrome   . DVT (deep venous thrombosis)   . Hyperlipemia   . Blood clotting disorder     V5 factor  . Clostridium difficile infection   . Hypertension   . Infiltrating ductal carcinoma of breast     stage one right   . Blood dyscrasia     factor 5 deficiency  . Arthritis     lumbar stenosis, radiculopathy, OA- L hip  . Clotting disorder   . Anxiety state, unspecified 09/22/2013   Past Surgical History  Procedure Laterality Date  . Total hip arthroplasty  1999    right  . Laparoscopic hysterectomy    . Splenectomy    . Right calf surgery      for cancer  . Colonoscopy    . Upper gastrointestinal endoscopy    . Joint replacement      R hip replacement   . Breast lumpectomy  2010    right Dr. [name]  . Abdominal hysterectomy      1960- ? vaginal hysterectomy, not laparoscopic   . Eye surgery      bilateral cataracts- /w IOL   . Lumbar laminectomy/decompression microdiscectomy  04/11/2011   Procedure: LUMBAR LAMINECTOMY/DECOMPRESSION MICRODISCECTOMY;  Surgeon: [name], MD;  Location: [facility];  Service: Neurosurgery;  Laterality: N/A;  Lumbar Five-Sacral One Microdiscectomy   Family History  Problem Relation Age of Onset  . Heart disease Mother   . Heart disease Father   . Heart disease      uncles  . Clotting disorder Maternal Uncle   . Breast cancer Paternal Aunt   . Cancer Paternal Aunt     breast  . Colon cancer Neg Hx   . Anesthesia problems Neg Hx   . Hypotension Neg Hx   . Malignant hyperthermia Neg Hx   . Pseudochol deficiency Neg Hx    History  Substance Use Topics  . Smoking status: Never Smoker   . Smokeless tobacco: Never Used  . Alcohol Use: No   OB History   Grav Para Term Preterm Abortions TAB SAB Ect Mult Living                 Review of Systems  Constitutional: Negative for fever.  Respiratory: Negative for cough and shortness of breath.   Gastrointestinal: Negative for nausea, vomiting and abdominal pain.  All other systems reviewed and are negative.     Allergies  Cefuroxime axetil; Statins; and Sulfonamide derivatives  Home Medications  Prior to Admission medications   Medication Sig Start Date End Date Taking? Authorizing Provider  ALPRAZolam (XANAX) 0.25 MG tablet Take 1 tablet (0.25 mg total) by mouth 2 (two) times daily as needed for anxiety. 09/22/13   Mosie Lukes, MD  cloNIDine (CATAPRES) 0.1 MG tablet TAKE 1 TABLET (0.1 MG TOTAL) BY MOUTH 3 (THREE) TIMES DAILY. 09/24/13   Mosie Lukes, MD  escitalopram (LEXAPRO) 10 MG tablet 1/2 tab po daily x 7 days then 1 tab po daily 09/22/13   Mosie Lukes, MD  furosemide (LASIX) 20 MG tablet Take 1 tablet (20 mg total) by mouth daily as needed for fluid or edema. 11/21/13   Mosie Lukes, MD  losartan (COZAAR) 100 MG tablet Take 1 tablet (100 mg total) by mouth daily. 11/21/13   Mosie Lukes, MD  warfarin (COUMADIN) 5 MG tablet Take 2.5-5 mg by mouth See admin  instructions. Take 1 tablet on Tuesday, Wednesday, Thursday, Saturday and Sunday. Take 2.5 tablet on Monday and Friday.    Historical Provider, MD   BP 189/85  Pulse 66  Temp(Src) 98.4 F (36.9 C) (Oral)  Resp 18  Ht 5\' 3"  (1.6 m)  Wt 166 lb (75.297 kg)  BMI 29.41 kg/m2  SpO2 96% Physical Exam  Vitals reviewed. Constitutional: She is oriented to person, place, and time. She appears well-developed and well-nourished.  HENT:  Head: Normocephalic and atraumatic.  Right Ear: External ear normal.  Left Ear: External ear normal.  Eyes: Conjunctivae and EOM are normal. Pupils are equal, round, and reactive to light.  Neck: Normal range of motion. Neck supple.  Cardiovascular: Normal rate, regular rhythm, normal heart sounds and intact distal pulses.   Pulmonary/Chest: Effort normal and breath sounds normal.  Abdominal: Soft. Bowel sounds are normal. There is no tenderness.  Musculoskeletal: Normal range of motion.  Bruising over L carpal tunnel with pain on flexion  Neurological: She is alert and oriented to person, place, and time.  Skin: Skin is warm and dry.    ED Course  Procedures (including critical care time) Labs Review Labs Reviewed  CBC WITH DIFFERENTIAL - Abnormal; Notable for the following:    Monocytes Relative 13 (*)    All other components within normal limits  BASIC METABOLIC PANEL - Abnormal; Notable for the following:    Glucose, Bld 114 (*)    GFR calc non Af Amer 81 (*)    All other components within normal limits  PROTIME-INR - Abnormal; Notable for the following:    Prothrombin Time 26.2 (*)    INR 2.40 (*)    All other components within normal limits    Imaging Review No results found.   EKG Interpretation None      MDM   Final diagnoses:  Bruising  Wrist contusion, left, initial encounter    80 y.o. female with pertinent PMH of DVT on coumadin, thoracic aneurysm presents with bruising to left wrist as described above. Patient was doing  yard work 3 days ago prior to onset of symptoms. She denies fever, has no limitation in range of motion on exam. Doubt septic arthritis, heme arthritis given exam and history. Do not feel x-ray is warranted given lack of tenderness over wrist. No signs of carpal tunnel syndrome. Check INR which was unremarkable. Patient discharged home in stable condition..    1. Bruising   2. Wrist contusion, left, initial encounter         Debby Freiberg, MD 11/26/13 1640

## 2013-11-26 NOTE — ED Notes (Signed)
Husband at bedside.  

## 2013-12-10 ENCOUNTER — Other Ambulatory Visit: Payer: Self-pay | Admitting: Dermatology

## 2013-12-15 ENCOUNTER — Other Ambulatory Visit: Payer: Self-pay

## 2013-12-15 DIAGNOSIS — Z86718 Personal history of other venous thrombosis and embolism: Secondary | ICD-10-CM

## 2013-12-19 MED ORDER — WARFARIN SODIUM 5 MG PO TABS: ORAL_TABLET | ORAL | Status: DC

## 2013-12-24 ENCOUNTER — Telehealth: Payer: Self-pay | Admitting: Family Medicine

## 2013-12-24 MED ORDER — CLONIDINE HCL 0.1 MG PO TABS: ORAL_TABLET | ORAL | Status: DC

## 2013-12-24 NOTE — Telephone Encounter (Signed)
New Rx sent to [facility]. Patient notified

## 2013-12-24 NOTE — Telephone Encounter (Signed)
Caller name:Leyda, Maryclare Relation to KH:VFMB Call back number:[phone] Pharmacy:[phone]  Reason for call: [facility] but no response, pt states she only has 1 dose left and is needing the rx. cloNIDine (CATAPRES) 0.1 MG tablet.

## 2013-12-31 ENCOUNTER — Ambulatory Visit (INDEPENDENT_AMBULATORY_CARE_PROVIDER_SITE_OTHER): Payer: Medicare Other

## 2013-12-31 DIAGNOSIS — Z5181 Encounter for therapeutic drug level monitoring: Secondary | ICD-10-CM

## 2013-12-31 DIAGNOSIS — Z86718 Personal history of other venous thrombosis and embolism: Secondary | ICD-10-CM

## 2013-12-31 DIAGNOSIS — D682 Hereditary deficiency of other clotting factors: Secondary | ICD-10-CM

## 2013-12-31 LAB — POCT INR: INR: 2.6

## 2014-01-05 ENCOUNTER — Encounter: Payer: Self-pay | Admitting: Medical

## 2014-01-05 ENCOUNTER — Ambulatory Visit (INDEPENDENT_AMBULATORY_CARE_PROVIDER_SITE_OTHER): Payer: Medicare Other | Admitting: Medical

## 2014-01-05 ENCOUNTER — Telehealth: Payer: Self-pay

## 2014-01-05 ENCOUNTER — Telehealth: Payer: Self-pay | Admitting: Medical

## 2014-01-05 VITALS — BP 132/74 | HR 72 | Temp 98.3°F | Ht 63.0 in | Wt 170.0 lb

## 2014-01-05 DIAGNOSIS — R52 Pain, unspecified: Secondary | ICD-10-CM

## 2014-01-05 DIAGNOSIS — I10 Essential (primary) hypertension: Secondary | ICD-10-CM

## 2014-01-05 LAB — POCT RAPID STREP A (OFFICE): Rapid Strep A Screen: NEGATIVE

## 2014-01-05 LAB — POCT INFLUENZA A/B
Influenza A, POC: NEGATIVE
Influenza B, POC: NEGATIVE

## 2014-01-05 MED ORDER — CLONIDINE HCL 0.2 MG PO TABS: 0.2000 mg | ORAL_TABLET | Freq: Three times a day (TID) | ORAL | Status: DC

## 2014-01-05 NOTE — Progress Notes (Signed)
Pre visit review using our clinic review tool, if applicable. No additional management support is needed unless otherwise documented below in the visit note. 

## 2014-01-05 NOTE — Telephone Encounter (Signed)
Patient states that her BP has been fluctuating from 198/88 to 134/73 over the weekend. Patient has not experienced any dizziness, lightheadedness, nausea, vomiting, chest pain but has experienced a slight headache at times. Patient is scheduled to be evaluated at 245p with Mackie Pai.

## 2014-01-05 NOTE — Telephone Encounter (Signed)
Wrong test results placed in chart by staff. I am working on getting rapid strep and flu taken out. Pt not to be charged.

## 2014-01-05 NOTE — Assessment & Plan Note (Signed)
Your bp readings that your give over last two days are showing majority of systolic in 875 range. diastolic look controlled. My reading today showed showed 165/88. I want you to increase you clonidine to 0.2 mg po tid. I want you to check your bp 3 times a day for next 2 days. About 30 minutes after each clonidine dose. Continue losartan and you lasix.   Notify me if you get any bp less than 120/70 range. Please call me Thursday with the lab results.

## 2014-01-05 NOTE — Progress Notes (Signed)
Subjective:    Patient ID: [name] Little, female    DOB: Jun 12, 1933, 80 y.o.   MRN: [id number]  HPI  Pt blood pressure this morning was 172/72. Then 20 minutes later it was 136/69. Then 4 hours later was 173/73. Last Friday night she started to get varying levels with wide range of fluctuation. Yesterday her bp was 172/76 and low yesterday 145/69. Another reading yesterday was systolic in 633'H. Pt denies any recently major stress recently.    Pt taking clonidine 0.1 mg  3 times a day. Pt is on losartan 100 mg q day.  Pt is on diuretic lasix 20 mg one tablet a day.   Pt states she might have been stressing and not aware.  No cardiac or neurologic signs or symptoms.  Past Medical History  Diagnosis Date  . Personal history of colonic polyps     hyperplastic  . Hemorrhoids   . Thoracic aortic aneurysm   . Paroxysmal supraventricular tachycardia   . Factor V deficiency   . TMJ pain dysfunction syndrome   . DVT (deep venous thrombosis)   . Hyperlipemia   . Blood clotting disorder     V5 factor  . Clostridium difficile infection   . Hypertension   . Infiltrating ductal carcinoma of breast     stage one right   . Blood dyscrasia     factor 5 deficiency  . Arthritis     lumbar stenosis, radiculopathy, OA- L hip  . Clotting disorder   . Anxiety state, unspecified 09/22/2013    History   Social History  . Marital Status: Married    Spouse Name: N/A    Number of Children: 4  . Years of Education: N/A   Occupational History  . Retired    Social History Main Topics  . Smoking status: Never Smoker   . Smokeless tobacco: Never Used  . Alcohol Use: No  . Drug Use: No  . Sexual Activity: Not on file   Other Topics Concern  . Not on file   Social History Narrative   Married 1955   2 sons - '59, '61, 2 daughters '55, '57; 8 grandchildren; 1 great grand   I-ADLs          Past Surgical History  Procedure Laterality Date  . Total hip arthroplasty  1999    right    . Laparoscopic hysterectomy    . Splenectomy    . Right calf surgery      for cancer  . Colonoscopy    . Upper gastrointestinal endoscopy    . Joint replacement      R hip replacement   . Breast lumpectomy  2010    right Dr. [name]  . Abdominal hysterectomy      1960- ? vaginal hysterectomy, not laparoscopic   . Eye surgery      bilateral cataracts- /w IOL   . Lumbar laminectomy/decompression microdiscectomy  04/11/2011    Procedure: LUMBAR LAMINECTOMY/DECOMPRESSION MICRODISCECTOMY;  Surgeon: [name], MD;  Location: [facility];  Service: Neurosurgery;  Laterality: N/A;  Lumbar Five-Sacral One Microdiscectomy    Family History  Problem Relation Age of Onset  . Heart disease Mother   . Heart disease Father   . Heart disease      uncles  . Clotting disorder Maternal Uncle   . Breast cancer Paternal Aunt   . Cancer Paternal Aunt     breast  . Colon cancer Neg Hx   . Anesthesia  problems Neg Hx   . Hypotension Neg Hx   . Malignant hyperthermia Neg Hx   . Pseudochol deficiency Neg Hx     Allergies  Allergen Reactions  . Cefuroxime Axetil     REACTION: Ddoes not remember reaction  . Statins     Weakness, pain Lipitor and Pravachol  . Sulfonamide Derivatives     REACTION: Rash    Current Outpatient Prescriptions on File Prior to Visit  Medication Sig Dispense Refill  . ALPRAZolam (XANAX) 0.25 MG tablet Take 1 tablet (0.25 mg total) by mouth 2 (two) times daily as needed for anxiety. 10 tablet 1  . cloNIDine (CATAPRES) 0.1 MG tablet TAKE 1 TABLET (0.1 MG TOTAL) BY MOUTH 3 (THREE) TIMES DAILY. 90 tablet 2  . escitalopram (LEXAPRO) 10 MG tablet 1/2 tab po daily x 7 days then 1 tab po daily 30 tablet 3  . furosemide (LASIX) 20 MG tablet Take 1 tablet (20 mg total) by mouth daily as needed for fluid or edema. 30 tablet 1  . losartan (COZAAR) 100 MG tablet Take 1 tablet (100 mg total) by mouth daily. 30 tablet 3  . warfarin (COUMADIN) 5 MG tablet Take as directed by  coumadin clinic. 90 tablet 0   No current facility-administered medications on file prior to visit.    BP 132/74 mmHg  Pulse 72  Temp(Src) 98.3 F (36.8 C) (Oral)  Ht 5\' 3"  (1.6 m)  Wt 170 lb (77.111 kg)  BMI 30.12 kg/m2  SpO2 95%   Review of Systems  Constitutional: Negative for fever, chills, diaphoresis, activity change and fatigue.  Respiratory: Negative for cough, chest tightness and shortness of breath.   Cardiovascular: Negative for chest pain, palpitations and leg swelling.  Gastrointestinal: Negative for nausea, vomiting and abdominal pain.  Musculoskeletal: Negative for neck pain and neck stiffness.  Neurological: Negative for dizziness, tremors, seizures, syncope, facial asymmetry, speech difficulty, weakness, light-headedness, numbness and headaches.  Psychiatric/Behavioral: Negative for behavioral problems, confusion and agitation. The patient is not nervous/anxious.        She speculates may be dealing with some underlying stress.       Objective:   Physical Exam   General Mental Status- Alert. General Appearance- Not in acute distress.   Skin General: Color- Normal Color. Moisture- Normal Moisture.  Neck Carotid Arteries- Normal color. Moisture- Normal Moisture. No carotid bruits. No JVD.  Chest and Lung Exam Auscultation: Breath Sounds:-Normal.  Cardiovascular Auscultation:Rythm- Regular, Rate and Rhythm. Murmurs & Other Heart Sounds:Auscultation of the heart reveals- No Murmurs.  Abdomen Inspection:-Inspeection Normal. Palpation/Percussion:Note:No mass. Palpation and Percussion of the abdomen reveal- Non Tender, Non Distended + BS, no rebound or guarding.    Neurologic Cranial Nerve exam:- CN III-XII intact(No nystagmus), symmetric smile. Drift Test:- No drift. Romberg Exam:- Negative.  .Finger to Nose:- Normal/Intact Strength:- 5/5 equal and symmetric strength both upper and lower extremities.        Assessment & Plan:

## 2014-01-05 NOTE — Patient Instructions (Addendum)
Your bp readings that your give over last two days are showing majority of systolic in 892 range. diastolic look controlled. My reading today showed showed 165/88. I want you to increase you clonidine 0.2 mg po tid. I want you to check your bp 3 times a day for next 2 days. About 30 minutes after each clonidine dose. Continue losartan and you lasix.   Notify me if you get any bp less than 120/70 range. Please call me Thursday with the lab results.  Follow up in 1 wk or as needed.  Pt did not report any myalgias to me. I ordered a rapid strep on pt before this pt. And a flu on pt before this pt. I think staff member accidentally placed order in this chart. Pt did not have bodyaches today.

## 2014-01-08 ENCOUNTER — Telehealth: Payer: Self-pay | Admitting: Family Medicine

## 2014-01-08 NOTE — Telephone Encounter (Signed)
Blood pressure reading routed to [employer], PA-C for review.

## 2014-01-08 NOTE — Telephone Encounter (Signed)
Caller name: Enrique Relation to pt: self Call back number: [phone] Pharmacy:  Reason for call:   Patient states that she was supposed to report BP readings to [facility]  01-06-14  133/71, 120/58, 123/63 pulse 69 01-07-14  148/74, 135/60, 123/61, 131/62  01-08-14 141/66, 128/63, 125/67

## 2014-01-09 NOTE — Telephone Encounter (Signed)
Very happy with her bp readings. Continue current bp meds and recent addition of clonidine. Call us when needs refill.

## 2014-01-09 NOTE — Telephone Encounter (Signed)
Called patient and made her aware.  No further needs at this time.

## 2014-02-03 ENCOUNTER — Ambulatory Visit (INDEPENDENT_AMBULATORY_CARE_PROVIDER_SITE_OTHER): Payer: Medicare Other | Admitting: Family Medicine

## 2014-02-03 ENCOUNTER — Encounter: Payer: Self-pay | Admitting: Family Medicine

## 2014-02-03 VITALS — BP 158/80 | HR 56 | Temp 97.6°F | Resp 16 | Wt 174.0 lb

## 2014-02-03 DIAGNOSIS — E669 Obesity, unspecified: Secondary | ICD-10-CM

## 2014-02-03 DIAGNOSIS — R609 Edema, unspecified: Secondary | ICD-10-CM

## 2014-02-03 DIAGNOSIS — I10 Essential (primary) hypertension: Secondary | ICD-10-CM

## 2014-02-03 DIAGNOSIS — F411 Generalized anxiety disorder: Secondary | ICD-10-CM

## 2014-02-03 MED ORDER — ESCITALOPRAM OXALATE 10 MG PO TABS: ORAL_TABLET | ORAL | Status: DC

## 2014-02-03 MED ORDER — NONFORMULARY OR COMPOUNDED ITEM: Status: DC

## 2014-02-03 MED ORDER — VALSARTAN 160 MG PO TABS: 160.0000 mg | ORAL_TABLET | Freq: Every day | ORAL | Status: DC

## 2014-02-03 MED ORDER — CLONIDINE HCL 0.2 MG PO TABS: 0.2000 mg | ORAL_TABLET | Freq: Two times a day (BID) | ORAL | Status: DC

## 2014-02-03 NOTE — Progress Notes (Signed)
Subjective:    Patient here for follow-up of elevated blood pressure.  She is not exercising and is adherent to a low-salt diet.  Blood pressure is not well controlled at home. Cardiac symptoms: none. Patient denies: chest pain, chest pressure/discomfort, claudication, dyspnea, exertional chest pressure/discomfort, fatigue, irregular heart beat, lower extremity edema, near-syncope, orthopnea, palpitations, paroxysmal nocturnal dyspnea, syncope and tachypnea. Cardiovascular risk factors: advanced age (older than 49 for men, 41 for women), hypertension and sedentary lifestyle. Use of agents associated with hypertension: none.  The following portions of the patient's history were reviewed and updated as appropriate:  She  has a past medical history of Personal history of colonic polyps; Hemorrhoids; Thoracic aortic aneurysm; Paroxysmal supraventricular tachycardia; Factor V deficiency; TMJ pain dysfunction syndrome; DVT (deep venous thrombosis); Hyperlipemia; Blood clotting disorder; Clostridium difficile infection; Hypertension; Infiltrating ductal carcinoma of breast; Blood dyscrasia; Arthritis; Clotting disorder; and Anxiety state, unspecified (09/22/2013). She  does not have any pertinent problems on file. She  has past surgical history that includes Total hip arthroplasty (1999); Laparoscopic hysterectomy; Splenectomy; right calf surgery; Colonoscopy; Upper gastrointestinal endoscopy; Joint replacement; Breast lumpectomy (2010); Abdominal hysterectomy; Eye surgery; and Lumbar laminectomy/decompression microdiscectomy (04/11/2011). Her family history includes Breast cancer in her paternal aunt; Cancer in her paternal aunt; Clotting disorder in her maternal uncle; Heart disease in her father, mother, and another family member. There is no history of Colon cancer, Anesthesia problems, Hypotension, Malignant hyperthermia, or Pseudochol deficiency. She  reports that she has never smoked. She has never used  smokeless tobacco. She reports that she does not drink alcohol or use illicit drugs. She has a current medication list which includes the following prescription(s): alprazolam, escitalopram, furosemide, warfarin, clonidine, NONFORMULARY OR COMPOUNDED ITEM, and valsartan. Current Outpatient Prescriptions on File Prior to Visit  Medication Sig Dispense Refill  . ALPRAZolam (XANAX) 0.25 MG tablet Take 1 tablet (0.25 mg total) by mouth 2 (two) times daily as needed for anxiety. 10 tablet 1  . furosemide (LASIX) 20 MG tablet Take 1 tablet (20 mg total) by mouth daily as needed for fluid or edema. 30 tablet 1  . warfarin (COUMADIN) 5 MG tablet Take as directed by coumadin clinic. 90 tablet 0   No current facility-administered medications on file prior to visit.   She is allergic to cefuroxime axetil; statins; and sulfonamide derivatives..  Review of Systems As above   . Objective:   BP 158/80 mmHg  Pulse 56  Temp(Src) 97.6 F (36.4 C) (Oral)  Resp 16  Wt 174 lb (78.926 kg)  SpO2 96% General appearance: alert, cooperative, appears stated age and no distress Eyes: conjunctivae/corneas clear. PERRL, EOM's intact. Fundi benign. Neck: no adenopathy, no carotid bruit, no JVD, supple, symmetrical, trachea midline and thyroid not enlarged, symmetric, no tenderness/mass/nodules Lungs: clear to auscultation bilaterally Heart: S1, S2 normal Extremities: extremities normal, atraumatic, no cyanosis or edema Neurologic: Alert and oriented X 3, normal strength and tone. Normal symmetric reflexes. Normal coordination and gait      Assessment:    Hypertension, elevated . Evidence of target organ damage: none.    Plan:    Medication: discontinue cozaar and begin diovan. Regular aerobic exercise. Follow up: 2 weeks and as needed.    1. Anxiety state Pt never started lexapro--- she will start now - escitalopram (LEXAPRO) 10 MG tablet; 1/2 tab po daily x 7 days then 1 tab po daily  Dispense: 30  tablet; Refill: 3  2. Essential hypertension Stop cozaar----- start diovan - cloNIDine (CATAPRES) 0.2  MG tablet; Take 1 tablet (0.2 mg total) by mouth 2 (two) times daily.  Dispense: 60 tablet; Refill: 2 - valsartan (DIOVAN) 160 MG tablet; Take 1 tablet (160 mg total) by mouth daily.  Dispense: 30 tablet; Refill: 2  3. Edema con't diuretics - NONFORMULARY OR COMPOUNDED ITEM; Compression stockings-- thigh high,  20-30 mm/hg #1 pair as directed  Dispense: 1 each; Refill: 0  4. Obesity (BMI 30-39.9)  Subjective:    Patient here for follow-up of elevated blood pressure.  She is not exercising and is adherent to a low-salt diet.  Blood pressure is not well controlled at home. Cardiac symptoms: none. Patient denies: chest pain, chest pressure/discomfort, claudication, dyspnea, exertional chest pressure/discomfort, fatigue, irregular heart beat, lower extremity edema, near-syncope, orthopnea, palpitations, paroxysmal nocturnal dyspnea and syncope. Cardiovascular risk factors: advanced age (older than 42 for men, 53 for women), dyslipidemia, hypertension and sedentary lifestyle. Use of agents associated with hypertension: none. History of target organ damage: none.  The following portions of the patient's history were reviewed and updated as appropriate:  She  has a past medical history of Personal history of colonic polyps; Hemorrhoids; Thoracic aortic aneurysm; Paroxysmal supraventricular tachycardia; Factor V deficiency; TMJ pain dysfunction syndrome; DVT (deep venous thrombosis); Hyperlipemia; Blood clotting disorder; Clostridium difficile infection; Hypertension; Infiltrating ductal carcinoma of breast; Blood dyscrasia; Arthritis; Clotting disorder; and Anxiety state, unspecified (09/22/2013). She  does not have any pertinent problems on file. She  has past surgical history that includes Total hip arthroplasty (1999); Laparoscopic hysterectomy; Splenectomy; right calf surgery; Colonoscopy; Upper  gastrointestinal endoscopy; Joint replacement; Breast lumpectomy (2010); Abdominal hysterectomy; Eye surgery; and Lumbar laminectomy/decompression microdiscectomy (04/11/2011). Her family history includes Breast cancer in her paternal aunt; Cancer in her paternal aunt; Clotting disorder in her maternal uncle; Heart disease in her father, mother, and another family member. There is no history of Colon cancer, Anesthesia problems, Hypotension, Malignant hyperthermia, or Pseudochol deficiency. She  reports that she has never smoked. She has never used smokeless tobacco. She reports that she does not drink alcohol or use illicit drugs. She has a current medication list which includes the following prescription(s): alprazolam, escitalopram, furosemide, warfarin, clonidine, NONFORMULARY OR COMPOUNDED ITEM, and valsartan. Current Outpatient Prescriptions on File Prior to Visit  Medication Sig Dispense Refill  . ALPRAZolam (XANAX) 0.25 MG tablet Take 1 tablet (0.25 mg total) by mouth 2 (two) times daily as needed for anxiety. 10 tablet 1  . furosemide (LASIX) 20 MG tablet Take 1 tablet (20 mg total) by mouth daily as needed for fluid or edema. 30 tablet 1  . warfarin (COUMADIN) 5 MG tablet Take as directed by coumadin clinic. 90 tablet 0   No current facility-administered medications on file prior to visit.   She is allergic to cefuroxime axetil; statins; and sulfonamide derivatives..  Review of Systems Pertinent items are noted in HPI.     Objective:    BP 158/80 mmHg  Pulse 56  Temp(Src) 97.6 F (36.4 C) (Oral)  Resp 16  Wt 174 lb (78.926 kg)  SpO2 96% General appearance: alert, cooperative, appears stated age and no distress Nose: Nares normal. Septum midline. Mucosa normal. No drainage or sinus tenderness. Throat: lips, mucosa, and tongue normal; teeth and gums normal Neck: no adenopathy, no carotid bruit, no JVD, supple, symmetrical, trachea midline and thyroid not enlarged, symmetric, no  tenderness/mass/nodules Lungs: clear to auscultation bilaterally Heart: S1, S2 normal Extremities: extremities normal, atraumatic, no cyanosis or edema    Assessment:    Hypertension,  not  controlle. Evidence of target organ damage: uncontrolled.    Plan:    Medication: discontinue cozaar and begin diovan. Dietary sodium restriction. Regular aerobic exercise. Check blood pressures 3 times weekly and record.

## 2014-02-03 NOTE — Progress Notes (Signed)
Pre visit review using our clinic review tool, if applicable. No additional management support is needed unless otherwise documented below in the visit note. 

## 2014-02-03 NOTE — Patient Instructions (Signed)
Stop the losartan and start diovan Decrease the clonidine back to 2x a day       Hypertension Hypertension, commonly called high blood pressure, is when the force of blood pumping through your arteries is too strong. Your arteries are the blood vessels that carry blood from your heart throughout your body. A blood pressure reading consists of a higher number over a lower number, such as 110/72. The higher number (systolic) is the pressure inside your arteries when your heart pumps. The lower number (diastolic) is the pressure inside your arteries when your heart relaxes. Ideally you want your blood pressure below 120/80. Hypertension forces your heart to work harder to pump blood. Your arteries may become narrow or stiff. Having hypertension puts you at risk for heart disease, stroke, and other problems.  RISK FACTORS Some risk factors for high blood pressure are controllable. Others are not.  Risk factors you cannot control include:   Race. You may be at higher risk if you are African American.  Age. Risk increases with age.  Gender. Men are at higher risk than women before age 85 years. After age 41, women are at higher risk than men. Risk factors you can control include:  Not getting enough exercise or physical activity.  Being overweight.  Getting too much fat, sugar, calories, or salt in your diet.  Drinking too much alcohol. SIGNS AND SYMPTOMS Hypertension does not usually cause signs or symptoms. Extremely high blood pressure (hypertensive crisis) may cause headache, anxiety, shortness of breath, and nosebleed. DIAGNOSIS  To check if you have hypertension, your health care provider will measure your blood pressure while you are seated, with your arm held at the level of your heart. It should be measured at least twice using the same arm. Certain conditions can cause a difference in blood pressure between your right and left arms. A blood pressure reading that is higher than  normal on one occasion does not mean that you need treatment. If one blood pressure reading is high, ask your health care provider about having it checked again. TREATMENT  Treating high blood pressure includes making lifestyle changes and possibly taking medicine. Living a healthy lifestyle can help lower high blood pressure. You may need to change some of your habits. Lifestyle changes may include:  Following the DASH diet. This diet is high in fruits, vegetables, and whole grains. It is low in salt, red meat, and added sugars.  Getting at least 2 hours of brisk physical activity every week.  Losing weight if necessary.  Not smoking.  Limiting alcoholic beverages.  Learning ways to reduce stress. If lifestyle changes are not enough to get your blood pressure under control, your health care provider may prescribe medicine. You may need to take more than one. Work closely with your health care provider to understand the risks and benefits. HOME CARE INSTRUCTIONS  Have your blood pressure rechecked as directed by your health care provider.   Take medicines only as directed by your health care provider. Follow the directions carefully. Blood pressure medicines must be taken as prescribed. The medicine does not work as well when you skip doses. Skipping doses also puts you at risk for problems.   Do not smoke.   Monitor your blood pressure at home as directed by your health care provider. SEEK MEDICAL CARE IF:   You think you are having a reaction to medicines taken.  You have recurrent headaches or feel dizzy.  You have swelling in your ankles.  You have trouble with your vision. SEEK IMMEDIATE MEDICAL CARE IF:  You develop a severe headache or confusion.  You have unusual weakness, numbness, or feel faint.  You have severe chest or abdominal pain.  You vomit repeatedly.  You have trouble breathing. MAKE SURE YOU:   Understand these instructions.  Will watch your  condition.  Will get help right away if you are not doing well or get worse. Document Released: 01/16/2005 Document Revised: 06/02/2013 Document Reviewed: 11/08/2012 [facility] Patient Information 2015 [place], [place]. This information is not intended to replace advice given to you by your health care provider. Make sure you discuss any questions you have with your health care provider.

## 2014-02-11 ENCOUNTER — Ambulatory Visit (INDEPENDENT_AMBULATORY_CARE_PROVIDER_SITE_OTHER): Payer: Medicare Other | Admitting: Family Medicine

## 2014-02-11 DIAGNOSIS — Z86718 Personal history of other venous thrombosis and embolism: Secondary | ICD-10-CM

## 2014-02-11 DIAGNOSIS — Z5181 Encounter for therapeutic drug level monitoring: Secondary | ICD-10-CM

## 2014-02-11 DIAGNOSIS — D682 Hereditary deficiency of other clotting factors: Secondary | ICD-10-CM

## 2014-02-11 LAB — POCT INR: INR: 3

## 2014-02-13 ENCOUNTER — Other Ambulatory Visit: Payer: Self-pay | Admitting: Family Medicine

## 2014-02-13 NOTE — Telephone Encounter (Signed)
Last filled: 09/22/13 Amt: 10, 1 refill Last OV:  02/03/14--Dr Lowne  Please advise.

## 2014-02-16 NOTE — Telephone Encounter (Signed)
Rx printed signed and faxed.//AB/CMA

## 2014-02-20 ENCOUNTER — Ambulatory Visit: Payer: Medicare Other | Admitting: Family Medicine

## 2014-02-21 ENCOUNTER — Other Ambulatory Visit: Payer: Self-pay | Admitting: Family Medicine

## 2014-02-26 ENCOUNTER — Ambulatory Visit (INDEPENDENT_AMBULATORY_CARE_PROVIDER_SITE_OTHER): Payer: Medicare Other | Admitting: Family Medicine

## 2014-02-26 ENCOUNTER — Encounter: Payer: Self-pay | Admitting: Family Medicine

## 2014-02-26 VITALS — BP 138/82 | HR 70 | Temp 98.2°F | Ht 63.0 in | Wt 173.8 lb

## 2014-02-26 DIAGNOSIS — E785 Hyperlipidemia, unspecified: Secondary | ICD-10-CM

## 2014-02-26 DIAGNOSIS — I10 Essential (primary) hypertension: Secondary | ICD-10-CM

## 2014-02-26 DIAGNOSIS — E119 Type 2 diabetes mellitus without complications: Secondary | ICD-10-CM | POA: Insufficient documentation

## 2014-02-26 DIAGNOSIS — E1169 Type 2 diabetes mellitus with other specified complication: Secondary | ICD-10-CM

## 2014-02-26 DIAGNOSIS — R351 Nocturia: Secondary | ICD-10-CM

## 2014-02-26 DIAGNOSIS — R739 Hyperglycemia, unspecified: Secondary | ICD-10-CM

## 2014-02-26 DIAGNOSIS — F411 Generalized anxiety disorder: Secondary | ICD-10-CM

## 2014-02-26 DIAGNOSIS — E669 Obesity, unspecified: Secondary | ICD-10-CM | POA: Insufficient documentation

## 2014-02-26 HISTORY — DX: Hyperglycemia, unspecified: R73.9

## 2014-02-26 HISTORY — DX: Type 2 diabetes mellitus with other specified complication: E11.69

## 2014-02-26 HISTORY — DX: Nocturia: R35.1

## 2014-02-26 LAB — URINALYSIS, ROUTINE W REFLEX MICROSCOPIC
Bilirubin Urine: NEGATIVE
Hgb urine dipstick: NEGATIVE
Leukocytes, UA: NEGATIVE
Nitrite: NEGATIVE
RBC / HPF: NONE SEEN (ref 0–?)
Specific Gravity, Urine: >=1.03 — AB (ref 1.000–1.030)
Total Protein, Urine: NEGATIVE
Urine Glucose: NEGATIVE
Urobilinogen, UA: 0.2 (ref 0.0–1.0)
pH: 5.5 (ref 5.0–8.0)

## 2014-02-26 LAB — HEMOGLOBIN A1C: Hgb A1c MFr Bld: 6.9 % — ABNORMAL HIGH (ref 4.6–6.5)

## 2014-02-26 MED ORDER — ESCITALOPRAM OXALATE 10 MG PO TABS
ORAL_TABLET | ORAL | Status: DC
Start: 2014-02-26 — End: 2014-07-13

## 2014-02-26 NOTE — Patient Instructions (Addendum)
Melatonin 2 mg to 5 mg as needed for sleep 1/2 hour prior to bedtime  Start the Lexapro/Escitalopram at 1/2 tab daily (takes 6-12 weeks to work)  Basic Carbohydrate Counting for Diabetes Mellitus Carbohydrate counting is a method for keeping track of the amount of carbohydrates you eat. Eating carbohydrates naturally increases the level of sugar (glucose) in your blood, so it is important for you to know the amount that is okay for you to have in every meal. Carbohydrate counting helps keep the level of glucose in your blood within normal limits. The amount of carbohydrates allowed is different for every person. A dietitian can help you calculate the amount that is right for you. Once you know the amount of carbohydrates you can have, you can count the carbohydrates in the foods you want to eat. Carbohydrates are found in the following foods:  Grains, such as breads and cereals.  Dried beans and soy products.  Starchy vegetables, such as potatoes, peas, and corn.  Fruit and fruit juices.  Milk and yogurt.  Sweets and snack foods, such as cake, cookies, candy, chips, soft drinks, and fruit drinks. CARBOHYDRATE COUNTING There are two ways to count the carbohydrates in your food. You can use either of the methods or a combination of both. Reading the "Nutrition Facts" on [place] The "Nutrition Facts" is an area that is included on the labels of almost all packaged food and beverages in the [place]. It includes the serving size of that food or beverage and information about the nutrients in each serving of the food, including the grams (g) of carbohydrate per serving.  Decide the number of servings of this food or beverage that you will be able to eat or drink. Multiply that number of servings by the number of grams of carbohydrate that is listed on the label for that serving. The total will be the amount of carbohydrates you will be having when you eat or drink this food or  beverage. Learning Standard Serving Sizes of Food When you eat food that is not packaged or does not include "Nutrition Facts" on the label, you need to measure the servings in order to count the amount of carbohydrates.A serving of most carbohydrate-rich foods contains about 15 g of carbohydrates. The following list includes serving sizes of carbohydrate-rich foods that provide 15 g ofcarbohydrate per serving:   1 slice of bread (1 oz) or 1 six-inch tortilla.    of a hamburger bun or English muffin.  4-6 crackers.   cup unsweetened dry cereal.    cup hot cereal.   cup rice or pasta.    cup mashed potatoes or  of a large baked potato.  1 cup fresh fruit or one small piece of fruit.    cup canned or frozen fruit or fruit juice.  1 cup milk.   cup plain fat-free yogurt or yogurt sweetened with artificial sweeteners.   cup cooked dried beans or starchy vegetable, such as peas, corn, or potatoes.  Decide the number of standard-size servings that you will eat. Multiply that number of servings by 15 (the grams of carbohydrates in that serving). For example, if you eat 2 cups of strawberries, you will have eaten 2 servings and 30 g of carbohydrates (2 servings x 15 g = 30 g). For foods such as soups and casseroles, in which more than one food is mixed in, you will need to count the carbohydrates in each food that is included. EXAMPLE OF CARBOHYDRATE  COUNTING Sample Dinner  3 oz chicken breast.   cup of brown rice.   cup of corn.  1 cup milk.   1 cup strawberries with sugar-free whipped topping.  Carbohydrate Calculation Step 1: Identify the foods that contain carbohydrates:   Rice.   Corn.   Milk.   Strawberries. Step 2:Calculate the number of servings eaten of each:   2 servings of rice.   1 serving of corn.   1 serving of milk.   1 serving of strawberries. Step 3: Multiply each of those number of servings by 15 g:   2 servings of  rice x 15 g = 30 g.   1 serving of corn x 15 g = 15 g.   1 serving of milk x 15 g = 15 g.   1 serving of strawberries x 15 g = 15 g. Step 4: Add together all of the amounts to find the total grams of carbohydrates eaten: 30 g + 15 g + 15 g + 15 g = 75 g. Document Released: 01/16/2005 Document Revised: 06/02/2013 Document Reviewed: 12/13/2012 [facility] Patient Information 2015 [place], [place]. This information is not intended to replace advice given to you by your health care provider. Make sure you discuss any questions you have with your health care provider.

## 2014-02-26 NOTE — Assessment & Plan Note (Signed)
Check a UA c&s today

## 2014-02-26 NOTE — Assessment & Plan Note (Signed)
Patient noting some nocturia, and sugar elevated on last blood draw. Will check hgba1c

## 2014-02-27 LAB — URINE CULTURE
Colony Count: NO GROWTH
Organism ID, Bacteria: NO GROWTH

## 2014-03-03 ENCOUNTER — Telehealth: Payer: Self-pay | Admitting: Family Medicine

## 2014-03-03 NOTE — Telephone Encounter (Signed)
Caller name: Jamyrah, Saur Relation to pt: self  Call back number: [phone]   Reason for call:  Pt wanted to inform you the name of blood sugar monitor its called accu chek

## 2014-03-04 NOTE — Telephone Encounter (Signed)
Please send in test strips for patient for her Accucheck, check sugars daily and as needed, disp #100 with 5 rf

## 2014-03-06 NOTE — Telephone Encounter (Addendum)
Verbally informed Dr. Charlett Blake of the note below and she stated that if the pt feels she needs to come in sooner that would be fine,but if she would like for her to go ahead and place a referral to the Nutritionist she can do that go ahead and do that.//AB/CMA   Called and spoke with the pt regarding the blood sugar monitor.  Pt stated that she has been feeling weak every since she stated the low carb diet.  Pt stated that she is having trouble counting the carbs.  She asked if maybe she could go to a Nutritionist so they could help her.  Pt asked if her she would have to pay for it and I informed her that I'm not sure what her insurance will cover.  Pt understood.  Pt asked when was her next visit with Dr. Charlett Blake and I informed her that it's scheduled on (04-03-14).  Pt stated that she feels she needs to be seen before then.  Informed the pt that I will talk with Dr. Charlett Blake.  Pt agreed.  Pt also stated that she would like to wait on getting the blood sugar monitor.  She asked if she loss weight and do the diet would she still have to check her blood sugar.  Informed the pt I will talk with Dr. Blyth.//AB/CMA

## 2014-03-08 NOTE — Assessment & Plan Note (Signed)
No bleeding episodes or concerns

## 2014-03-08 NOTE — Assessment & Plan Note (Signed)
Statin intolerant. Encouraged heart healthy diet, increase exercise, avoid trans fats, consider a krill oil cap daily

## 2014-03-08 NOTE — Progress Notes (Signed)
[name] Little  [id number] 15-Nov-1933 03/08/2014      Progress Note-Follow Up  Subjective  Chief Complaint  Chief Complaint  Patient presents with  . Follow-up    3 mos    HPI  Patient is a 81 y.o. female in today for routine medical care. In today for follow-up. Does note recently for blood sugars been trending up slightly and she is noting some urinary frequency most notably at night. Denies any dysuria or hematuria. No other recent illness or acute concerns. Denies CP/palp/SOB/HA/congestion/fevers/GI or GU c/o. Taking meds as prescribed  Past Medical History  Diagnosis Date  . Personal history of colonic polyps     hyperplastic  . Hemorrhoids   . Thoracic aortic aneurysm   . Paroxysmal supraventricular tachycardia   . Factor V deficiency   . TMJ pain dysfunction syndrome   . DVT (deep venous thrombosis)   . Hyperlipemia   . Blood clotting disorder     V5 factor  . Clostridium difficile infection   . Hypertension   . Infiltrating ductal carcinoma of breast     stage one right   . Blood dyscrasia     factor 5 deficiency  . Arthritis     lumbar stenosis, radiculopathy, OA- L hip  . Clotting disorder   . Anxiety state, unspecified 09/22/2013  . Hyperglycemia 02/26/2014  . Nocturia 02/26/2014    Past Surgical History  Procedure Laterality Date  . Total hip arthroplasty  1999    right  . Laparoscopic hysterectomy    . Splenectomy    . Right calf surgery      for cancer  . Colonoscopy    . Upper gastrointestinal endoscopy    . Joint replacement      R hip replacement   . Breast lumpectomy  2010    right Dr. [name]  . Abdominal hysterectomy      1960- ? vaginal hysterectomy, not laparoscopic   . Eye surgery      bilateral cataracts- /w IOL   . Lumbar laminectomy/decompression microdiscectomy  04/11/2011    Procedure: LUMBAR LAMINECTOMY/DECOMPRESSION MICRODISCECTOMY;  Surgeon: [name], MD;  Location: [facility];  Service: Neurosurgery;  Laterality: N/A;   Lumbar Five-Sacral One Microdiscectomy    Family History  Problem Relation Age of Onset  . Heart disease Mother   . Heart disease Father   . Heart disease      uncles  . Clotting disorder Maternal Uncle   . Breast cancer Paternal Aunt   . Cancer Paternal Aunt     breast  . Colon cancer Neg Hx   . Anesthesia problems Neg Hx   . Hypotension Neg Hx   . Malignant hyperthermia Neg Hx   . Pseudochol deficiency Neg Hx     History   Social History  . Marital Status: Married    Spouse Name: N/A    Number of Children: 4  . Years of Education: N/A   Occupational History  . Retired    Social History Main Topics  . Smoking status: Never Smoker   . Smokeless tobacco: Never Used  . Alcohol Use: No  . Drug Use: No  . Sexual Activity: Not on file   Other Topics Concern  . Not on file   Social History Narrative   Married 1955   2 sons - '59, '61, 2 daughters '55, '57; 8 grandchildren; 1 great grand   I-ADLs          Current Outpatient  Prescriptions on File Prior to Visit  Medication Sig Dispense Refill  . ALPRAZolam (XANAX) 0.25 MG tablet TAKE 1 ATABLET BY MOUTH TWICE DAILY AS NEEDED FOR ANXIETY 10 tablet 1  . cloNIDine (CATAPRES) 0.2 MG tablet Take 1 tablet (0.2 mg total) by mouth 2 (two) times daily. 60 tablet 2  . furosemide (LASIX) 20 MG tablet Take 1 tablet (20 mg total) by mouth daily as needed for fluid or edema. 30 tablet 1  . NONFORMULARY OR COMPOUNDED ITEM Compression stockings-- thigh high,  20-30 mm/hg #1 pair as directed 1 each 0  . valsartan (DIOVAN) 160 MG tablet Take 1 tablet (160 mg total) by mouth daily. 30 tablet 2  . warfarin (COUMADIN) 5 MG tablet TAKE AS DIRECTED BY COUMADIN CLINIC. 90 tablet 0   No current facility-administered medications on file prior to visit.    Allergies  Allergen Reactions  . Cefuroxime Axetil     REACTION: Ddoes not remember reaction  . Statins     Weakness, pain Lipitor and Pravachol  . Sulfonamide Derivatives      REACTION: Rash    Review of Systems  Review of Systems  Constitutional: Positive for malaise/fatigue. Negative for fever.  HENT: Negative for congestion.   Eyes: Negative for discharge.  Respiratory: Negative for shortness of breath.   Cardiovascular: Negative for chest pain, palpitations and leg swelling.  Gastrointestinal: Negative for nausea, abdominal pain and diarrhea.  Genitourinary: Positive for frequency. Negative for dysuria.  Musculoskeletal: Negative for falls.  Skin: Negative for rash.  Neurological: Negative for loss of consciousness and headaches.  Endo/Heme/Allergies: Negative for polydipsia.  Psychiatric/Behavioral: Positive for depression. Negative for suicidal ideas. The patient is nervous/anxious. The patient does not have insomnia.     Objective  BP 138/82 mmHg  Pulse 70  Temp(Src) 98.2 F (36.8 C) (Oral)  Ht 5\' 3"  (1.6 m)  Wt 173 lb 12.8 oz (78.835 kg)  BMI 30.79 kg/m2  SpO2 95%  Physical Exam  Physical Exam  Constitutional: She is oriented to person, place, and time and well-developed, well-nourished, and in no distress. No distress.  HENT:  Head: Normocephalic and atraumatic.  Eyes: Conjunctivae are normal.  Neck: Neck supple. No thyromegaly present.  Cardiovascular: Normal rate, regular rhythm and normal heart sounds.   No murmur heard. Pulmonary/Chest: Effort normal and breath sounds normal. She has no wheezes.  Abdominal: She exhibits no distension and no mass.  Musculoskeletal: She exhibits no edema.  Lymphadenopathy:    She has no cervical adenopathy.  Neurological: She is alert and oriented to person, place, and time.  Skin: Skin is warm and dry. No rash noted. She is not diaphoretic.  Psychiatric: Memory, affect and judgment normal.    Lab Results  Component Value Date   TSH 1.74 11/19/2013   Lab Results  Component Value Date   WBC 7.1 11/26/2013   HGB 14.4 11/26/2013   HCT 44.4 11/26/2013   MCV 91.5 11/26/2013   PLT 332  11/26/2013   Lab Results  Component Value Date   CREATININE 0.70 11/26/2013   BUN 17 11/26/2013   NA 143 11/26/2013   K 4.3 11/26/2013   CL 105 11/26/2013   CO2 25 11/26/2013   Lab Results  Component Value Date   ALT 14 11/19/2013   AST 19 11/19/2013   ALKPHOS 64 11/19/2013   BILITOT 0.8 11/19/2013   Lab Results  Component Value Date   CHOL 214* 11/19/2013   Lab Results  Component Value Date  HDL 41.10 11/19/2013   Lab Results  Component Value Date   LDLCALC 147* 11/19/2013   Lab Results  Component Value Date   TRIG 130.0 11/19/2013   Lab Results  Component Value Date   CHOLHDL 5 11/19/2013     Assessment & Plan  Hyperglycemia Patient noting some nocturia, and sugar elevated on last blood draw. Will check hgba1c   Nocturia Check a UA c&s today   Essential hypertension Improved on repeat. Well controlled, no changes to meds. Encouraged heart healthy diet such as the DASH diet and exercise as tolerated.    FACTOR V DEFICIENCY No bleeding episodes or concerns   Obesity (BMI 30-39.9) Encouraged DASH diet, decrease po intake and increase exercise as tolerated. Needs 7-8 hours of sleep nightly. Avoid trans fats, eat small, frequent meals every 4-5 hours with lean proteins, complex carbs and healthy fats. Minimize simple carbs   Hyperlipidemia Statin intolerant. Encouraged heart healthy diet, increase exercise, avoid trans fats, consider a krill oil cap daily   Anxiety state Worsening, is given a trial of Escitalopram to consider taking.

## 2014-03-08 NOTE — Assessment & Plan Note (Signed)
Encouraged DASH diet, decrease po intake and increase exercise as tolerated. Needs 7-8 hours of sleep nightly. Avoid trans fats, eat small, frequent meals every 4-5 hours with lean proteins, complex carbs and healthy fats. Minimize simple carbs 

## 2014-03-08 NOTE — Assessment & Plan Note (Signed)
Worsening, is given a trial of Escitalopram to consider taking.

## 2014-03-08 NOTE — Assessment & Plan Note (Signed)
Improved on repeat. Well controlled, no changes to meds. Encouraged heart healthy diet such as the DASH diet and exercise as tolerated.

## 2014-03-09 NOTE — Telephone Encounter (Signed)
Caller name:Tai Branca Relationship to patient:Self Can be reached:home# [phone]   Reason for call:Pt calling in reference to request to see Dr. Charlett Blake before 04/03/14-per remarks waiting on autho from Dr. B to schedule.

## 2014-03-10 NOTE — Telephone Encounter (Signed)
Called and spoke with the pt and informed her of Dr. Frederik Pear recommendation below.  Pt understood and asked if she could come in and see Dr. Charlett Blake because she's having weakness in her legs.  Pt was scheduled for Thurs. 03/12/14 @ 2:15pm.//AB/CMA

## 2014-03-12 ENCOUNTER — Ambulatory Visit (INDEPENDENT_AMBULATORY_CARE_PROVIDER_SITE_OTHER): Payer: Medicare Other | Admitting: Family Medicine

## 2014-03-12 ENCOUNTER — Encounter: Payer: Self-pay | Admitting: Family Medicine

## 2014-03-12 VITALS — BP 134/74 | HR 68 | Temp 98.4°F | Ht 63.0 in | Wt 167.0 lb

## 2014-03-12 DIAGNOSIS — E669 Obesity, unspecified: Secondary | ICD-10-CM

## 2014-03-12 DIAGNOSIS — R739 Hyperglycemia, unspecified: Secondary | ICD-10-CM

## 2014-03-12 DIAGNOSIS — M545 Low back pain: Secondary | ICD-10-CM

## 2014-03-12 DIAGNOSIS — I10 Essential (primary) hypertension: Secondary | ICD-10-CM

## 2014-03-12 DIAGNOSIS — N390 Urinary tract infection, site not specified: Secondary | ICD-10-CM

## 2014-03-12 MED ORDER — CLONIDINE HCL 0.1 MG PO TABS: 0.1000 mg | ORAL_TABLET | Freq: Two times a day (BID) | ORAL | Status: DC

## 2014-03-12 NOTE — Progress Notes (Signed)
Pre visit review using our clinic review tool, if applicable. No additional management support is needed unless otherwise documented below in the visit note. 

## 2014-03-12 NOTE — Patient Instructions (Signed)

## 2014-03-26 ENCOUNTER — Encounter: Payer: Self-pay | Admitting: Family Medicine

## 2014-03-26 ENCOUNTER — Ambulatory Visit (INDEPENDENT_AMBULATORY_CARE_PROVIDER_SITE_OTHER): Payer: Medicare Other | Admitting: General Practice

## 2014-03-26 DIAGNOSIS — Z5181 Encounter for therapeutic drug level monitoring: Secondary | ICD-10-CM

## 2014-03-26 DIAGNOSIS — Z86718 Personal history of other venous thrombosis and embolism: Secondary | ICD-10-CM

## 2014-03-26 DIAGNOSIS — M545 Low back pain, unspecified: Secondary | ICD-10-CM | POA: Insufficient documentation

## 2014-03-26 HISTORY — DX: Low back pain: M54.5

## 2014-03-26 LAB — POCT INR: INR: 2.7

## 2014-03-26 NOTE — Assessment & Plan Note (Signed)
hgba1c acceptable, minimize simple carbs. Increase exercise as tolerated.  

## 2014-03-26 NOTE — Assessment & Plan Note (Signed)
Urine culture negative, encouraged moist heat and topical treatments as needed

## 2014-03-26 NOTE — Assessment & Plan Note (Signed)
Tolerating Coumadin, no changes

## 2014-03-26 NOTE — Progress Notes (Signed)
[name] Little  [id number] 10/05/1933 03/26/2014      Progress Note-Follow Up  Subjective  Chief Complaint  Chief Complaint  Patient presents with  . Extremity Weakness    HPI  Patient is a 81 y.o. female in today for routine medical care. She is in today to discuss some discomfort. Has some low back and lower abdominal discomfort. Notes some weakness in her legs. No falls no new neurologic symptoms. No recent illness, fevers, chills. Denies CP/palp/SOB/HA/congestion/fevers/GI or GU c/o. Taking meds as prescribed  Past Medical History  Diagnosis Date  . Personal history of colonic polyps     hyperplastic  . Hemorrhoids   . Thoracic aortic aneurysm   . Paroxysmal supraventricular tachycardia   . Factor V deficiency   . TMJ pain dysfunction syndrome   . DVT (deep venous thrombosis)   . Hyperlipemia   . Blood clotting disorder     V5 factor  . Clostridium difficile infection   . Hypertension   . Infiltrating ductal carcinoma of breast     stage one right   . Blood dyscrasia     factor 5 deficiency  . Arthritis     lumbar stenosis, radiculopathy, OA- L hip  . Clotting disorder   . Anxiety state, unspecified 09/22/2013  . Hyperglycemia 02/26/2014  . Nocturia 02/26/2014  . Low back pain 03/26/2014    Past Surgical History  Procedure Laterality Date  . Total hip arthroplasty  1999    right  . Laparoscopic hysterectomy    . Splenectomy    . Right calf surgery      for cancer  . Colonoscopy    . Upper gastrointestinal endoscopy    . Joint replacement      R hip replacement   . Breast lumpectomy  2010    right Dr. [name]  . Abdominal hysterectomy      1960- ? vaginal hysterectomy, not laparoscopic   . Eye surgery      bilateral cataracts- /w IOL   . Lumbar laminectomy/decompression microdiscectomy  04/11/2011    Procedure: LUMBAR LAMINECTOMY/DECOMPRESSION MICRODISCECTOMY;  Surgeon: [name], MD;  Location: [facility];  Service: Neurosurgery;  Laterality:  N/A;  Lumbar Five-Sacral One Microdiscectomy    Family History  Problem Relation Age of Onset  . Heart disease Mother   . Heart disease Father   . Heart disease      uncles  . Clotting disorder Maternal Uncle   . Breast cancer Paternal Aunt   . Cancer Paternal Aunt     breast  . Colon cancer Neg Hx   . Anesthesia problems Neg Hx   . Hypotension Neg Hx   . Malignant hyperthermia Neg Hx   . Pseudochol deficiency Neg Hx     History   Social History  . Marital Status: Married    Spouse Name: N/A  . Number of Children: 4  . Years of Education: N/A   Occupational History  . Retired    Social History Main Topics  . Smoking status: Never Smoker   . Smokeless tobacco: Never Used  . Alcohol Use: No  . Drug Use: No  . Sexual Activity: Not on file   Other Topics Concern  . Not on file   Social History Narrative   Married 1955   2 sons - '59, '61, 2 daughters '55, '57; 8 grandchildren; 1 great grand   I-ADLs          Current Outpatient Prescriptions on File  Prior to Visit  Medication Sig Dispense Refill  . ALPRAZolam (XANAX) 0.25 MG tablet TAKE 1 ATABLET BY MOUTH TWICE DAILY AS NEEDED FOR ANXIETY 10 tablet 1  . escitalopram (LEXAPRO) 10 MG tablet 1/2 tab po daily x 7 days then 1 tab po daily 30 tablet 3  . furosemide (LASIX) 20 MG tablet Take 1 tablet (20 mg total) by mouth daily as needed for fluid or edema. 30 tablet 1  . NONFORMULARY OR COMPOUNDED ITEM Compression stockings-- thigh high,  20-30 mm/hg #1 pair as directed 1 each 0  . valsartan (DIOVAN) 160 MG tablet Take 1 tablet (160 mg total) by mouth daily. 30 tablet 2  . warfarin (COUMADIN) 5 MG tablet TAKE AS DIRECTED BY COUMADIN CLINIC. 90 tablet 0   No current facility-administered medications on file prior to visit.    Allergies  Allergen Reactions  . Cefuroxime Axetil     REACTION: Ddoes not remember reaction  . Statins     Weakness, pain Lipitor and Pravachol  . Sulfonamide Derivatives     REACTION:  Rash    Review of Systems  Review of Systems  Constitutional: Negative for fever, chills and malaise/fatigue.  HENT: Negative for congestion, hearing loss and nosebleeds.   Eyes: Negative for discharge.  Respiratory: Negative for cough, sputum production, shortness of breath and wheezing.   Cardiovascular: Negative for chest pain, palpitations and leg swelling.  Gastrointestinal: Positive for abdominal pain. Negative for heartburn, nausea, vomiting, diarrhea, constipation, blood in stool and melena.  Genitourinary: Negative for dysuria, urgency, frequency and hematuria.  Musculoskeletal: Positive for back pain. Negative for myalgias and falls.  Skin: Negative for rash.  Neurological: Negative for dizziness, tremors, sensory change, focal weakness, loss of consciousness, weakness and headaches.  Endo/Heme/Allergies: Negative for polydipsia. Does not bruise/bleed easily.  Psychiatric/Behavioral: Negative for depression and suicidal ideas. The patient is not nervous/anxious and does not have insomnia.     Objective  BP 134/74 mmHg  Pulse 68  Temp(Src) 98.4 F (36.9 C) (Oral)  Ht 5\' 3"  (1.6 m)  Wt 167 lb (75.751 kg)  BMI 29.59 kg/m2  SpO2 95%  Physical Exam  Physical Exam  Constitutional: She is oriented to person, place, and time and well-developed, well-nourished, and in no distress. No distress.  HENT:  Head: Normocephalic and atraumatic.  Eyes: Conjunctivae are normal.  Neck: Neck supple. No thyromegaly present.  Cardiovascular: Normal rate, regular rhythm and normal heart sounds.   No murmur heard. Pulmonary/Chest: Effort normal and breath sounds normal. She has no wheezes.  Abdominal: She exhibits no distension and no mass.  Musculoskeletal: She exhibits no edema.  Lymphadenopathy:    She has no cervical adenopathy.  Neurological: She is alert and oriented to person, place, and time.  Skin: Skin is warm and dry. No rash noted. She is not diaphoretic.  Psychiatric:  Memory, affect and judgment normal.    Lab Results  Component Value Date   TSH 1.74 11/19/2013   Lab Results  Component Value Date   WBC 7.1 11/26/2013   HGB 14.4 11/26/2013   HCT 44.4 11/26/2013   MCV 91.5 11/26/2013   PLT 332 11/26/2013   Lab Results  Component Value Date   CREATININE 0.70 11/26/2013   BUN 17 11/26/2013   NA 143 11/26/2013   K 4.3 11/26/2013   CL 105 11/26/2013   CO2 25 11/26/2013   Lab Results  Component Value Date   ALT 14 11/19/2013   AST 19 11/19/2013   ALKPHOS  64 11/19/2013   BILITOT 0.8 11/19/2013   Lab Results  Component Value Date   CHOL 214* 11/19/2013   Lab Results  Component Value Date   HDL 41.10 11/19/2013   Lab Results  Component Value Date   LDLCALC 147* 11/19/2013   Lab Results  Component Value Date   TRIG 130.0 11/19/2013   Lab Results  Component Value Date   CHOLHDL 5 11/19/2013     Assessment & Plan  Essential hypertension Denies CP/palp/SOB/HA/congestion/fevers/GI or GU c/o. Taking meds as prescribed   Hyperglycemia hgba1c acceptable, minimize simple carbs. Increase exercise as tolerated.   Obesity (BMI 30-39.9) Encouraged DASH diet, decrease po intake and increase exercise as tolerated. Needs 7-8 hours of sleep nightly. Avoid trans fats, eat small, frequent meals every 4-5 hours with lean proteins, complex carbs and healthy fats. Minimize simple carbs   FACTOR V DEFICIENCY Tolerating Coumadin, no changes   Low back pain Urine culture negative, encouraged moist heat and topical treatments as needed

## 2014-03-26 NOTE — Assessment & Plan Note (Signed)
Encouraged DASH diet, decrease po intake and increase exercise as tolerated. Needs 7-8 hours of sleep nightly. Avoid trans fats, eat small, frequent meals every 4-5 hours with lean proteins, complex carbs and healthy fats. Minimize simple carbs 

## 2014-03-26 NOTE — Progress Notes (Signed)
Pre visit review using our clinic review tool, if applicable. No additional management support is needed unless otherwise documented below in the visit note. 

## 2014-03-26 NOTE — Assessment & Plan Note (Signed)
Denies CP/palp/SOB/HA/congestion/fevers/GI or GU c/o. Taking meds as prescribed 

## 2014-04-02 ENCOUNTER — Ambulatory Visit: Payer: Medicare Other | Admitting: Family Medicine

## 2014-04-03 ENCOUNTER — Encounter: Payer: Self-pay | Admitting: Family Medicine

## 2014-04-03 ENCOUNTER — Ambulatory Visit (INDEPENDENT_AMBULATORY_CARE_PROVIDER_SITE_OTHER): Payer: Medicare Other | Admitting: Family Medicine

## 2014-04-03 VITALS — BP 140/76 | HR 73 | Temp 98.4°F | Ht 63.0 in | Wt 160.4 lb

## 2014-04-03 DIAGNOSIS — I10 Essential (primary) hypertension: Secondary | ICD-10-CM

## 2014-04-03 DIAGNOSIS — Z23 Encounter for immunization: Secondary | ICD-10-CM

## 2014-04-03 DIAGNOSIS — E119 Type 2 diabetes mellitus without complications: Secondary | ICD-10-CM

## 2014-04-03 DIAGNOSIS — E669 Obesity, unspecified: Secondary | ICD-10-CM

## 2014-04-03 DIAGNOSIS — E1169 Type 2 diabetes mellitus with other specified complication: Secondary | ICD-10-CM

## 2014-04-03 DIAGNOSIS — R002 Palpitations: Secondary | ICD-10-CM

## 2014-04-03 MED ORDER — ZOSTER VACCINE LIVE 19400 UNT/0.65ML ~~LOC~~ SOLR: 0.6500 mL | Freq: Once | SUBCUTANEOUS | Status: DC

## 2014-04-03 NOTE — Progress Notes (Signed)
Pre visit review using our clinic review tool, if applicable. No additional management support is needed unless otherwise documented below in the visit note. 

## 2014-04-12 ENCOUNTER — Encounter: Payer: Self-pay | Admitting: Family Medicine

## 2014-04-12 NOTE — Assessment & Plan Note (Signed)
Encouraged DASH diet, decrease po intake and increase exercise as tolerated. Needs 7-8 hours of sleep nightly. Avoid trans fats, eat small, frequent meals every 4-5 hours with lean proteins, complex carbs and healthy fats. Minimize simple carbs 

## 2014-04-12 NOTE — Assessment & Plan Note (Signed)
No recent flares 

## 2014-04-12 NOTE — Assessment & Plan Note (Signed)
hgba1c acceptable, minimize simple carbs. Increase exercise as tolerated. Continue current meds 

## 2014-04-12 NOTE — Assessment & Plan Note (Signed)
Improved on recheck. Well controlled, no changes to meds. Encouraged heart healthy diet such as the DASH diet and exercise as tolerated.  

## 2014-04-12 NOTE — Progress Notes (Signed)
[name] Little  [id number] Feb 25, 1933 04/12/2014      Progress Note-Follow Up  Subjective  Chief Complaint  Chief Complaint  Patient presents with  . Follow-up    HPI  Patient is a 81 y.o. female in today for routine medical care. Patient in today for follow-up been doing well. Reports blood sugars ranging from the high 60s to 140s. Denies polyuria or polydipsia. Reports blood pressures at home generally in the 130s over 60s. No recent illness. Denies CP/palp/SOB/HA/congestion/fevers/GI or GU c/o. Taking meds as prescribed  Past Medical History  Diagnosis Date  . Personal history of colonic polyps     hyperplastic  . Hemorrhoids   . Thoracic aortic aneurysm   . Paroxysmal supraventricular tachycardia   . Factor V deficiency   . TMJ pain dysfunction syndrome   . DVT (deep venous thrombosis)   . Hyperlipemia   . Blood clotting disorder     V5 factor  . Clostridium difficile infection   . Hypertension   . Infiltrating ductal carcinoma of breast     stage one right   . Blood dyscrasia     factor 5 deficiency  . Arthritis     lumbar stenosis, radiculopathy, OA- L hip  . Clotting disorder   . Anxiety state, unspecified 09/22/2013  . Hyperglycemia 02/26/2014  . Nocturia 02/26/2014  . Low back pain 03/26/2014  . Diabetes mellitus type 2 in obese 02/26/2014    Past Surgical History  Procedure Laterality Date  . Total hip arthroplasty  1999    right  . Laparoscopic hysterectomy    . Splenectomy    . Right calf surgery      for cancer  . Colonoscopy    . Upper gastrointestinal endoscopy    . Joint replacement      R hip replacement   . Breast lumpectomy  2010    right Dr. [name]  . Abdominal hysterectomy      1960- ? vaginal hysterectomy, not laparoscopic   . Eye surgery      bilateral cataracts- /w IOL   . Lumbar laminectomy/decompression microdiscectomy  04/11/2011    Procedure: LUMBAR LAMINECTOMY/DECOMPRESSION MICRODISCECTOMY;  Surgeon: [name], MD;   Location: [facility];  Service: Neurosurgery;  Laterality: N/A;  Lumbar Five-Sacral One Microdiscectomy    Family History  Problem Relation Age of Onset  . Heart disease Mother   . Heart disease Father   . Heart disease      uncles  . Clotting disorder Maternal Uncle   . Breast cancer Paternal Aunt   . Cancer Paternal Aunt     breast  . Colon cancer Neg Hx   . Anesthesia problems Neg Hx   . Hypotension Neg Hx   . Malignant hyperthermia Neg Hx   . Pseudochol deficiency Neg Hx     History   Social History  . Marital Status: Married    Spouse Name: N/A  . Number of Children: 4  . Years of Education: N/A   Occupational History  . Retired    Social History Main Topics  . Smoking status: Never Smoker   . Smokeless tobacco: Never Used  . Alcohol Use: No  . Drug Use: No  . Sexual Activity: Not on file   Other Topics Concern  . Not on file   Social History Narrative   Married 1955   2 sons - '59, '61, 2 daughters '55, '57; 8 grandchildren; 1 great grand   I-ADLs  Current Outpatient Prescriptions on File Prior to Visit  Medication Sig Dispense Refill  . ALPRAZolam (XANAX) 0.25 MG tablet TAKE 1 ATABLET BY MOUTH TWICE DAILY AS NEEDED FOR ANXIETY 10 tablet 1  . cloNIDine (CATAPRES) 0.1 MG tablet Take 1 tablet (0.1 mg total) by mouth 2 (two) times daily. 60 tablet 1  . escitalopram (LEXAPRO) 10 MG tablet 1/2 tab po daily x 7 days then 1 tab po daily 30 tablet 3  . NONFORMULARY OR COMPOUNDED ITEM Compression stockings-- thigh high,  20-30 mm/hg #1 pair as directed 1 each 0  . valsartan (DIOVAN) 160 MG tablet Take 1 tablet (160 mg total) by mouth daily. 30 tablet 2  . warfarin (COUMADIN) 5 MG tablet TAKE AS DIRECTED BY COUMADIN CLINIC. 90 tablet 0  . furosemide (LASIX) 20 MG tablet Take 1 tablet (20 mg total) by mouth daily as needed for fluid or edema. (Patient not taking: Reported on 04/03/2014) 30 tablet 1   No current facility-administered medications on file  prior to visit.    Allergies  Allergen Reactions  . Cefuroxime Axetil     REACTION: Ddoes not remember reaction  . Statins     Weakness, pain Lipitor and Pravachol  . Sulfonamide Derivatives     REACTION: Rash    Review of Systems  Review of Systems  Constitutional: Negative for fever and malaise/fatigue.  HENT: Negative for congestion.   Eyes: Negative for discharge.  Respiratory: Negative for shortness of breath.   Cardiovascular: Negative for chest pain, palpitations and leg swelling.  Gastrointestinal: Negative for nausea, abdominal pain and diarrhea.  Genitourinary: Negative for dysuria.  Musculoskeletal: Negative for falls.  Skin: Negative for rash.  Neurological: Negative for loss of consciousness and headaches.  Endo/Heme/Allergies: Negative for polydipsia.  Psychiatric/Behavioral: Negative for depression and suicidal ideas. The patient is not nervous/anxious and does not have insomnia.     Objective  BP 140/76 mmHg  Pulse 73  Temp(Src) 98.4 F (36.9 C) (Oral)  Ht 5\' 3"  (1.6 m)  Wt 160 lb 6 oz (72.746 kg)  BMI 28.42 kg/m2  SpO2 94%  Physical Exam  Physical Exam  Constitutional: She is oriented to person, place, and time and well-developed, well-nourished, and in no distress. No distress.  HENT:  Head: Normocephalic and atraumatic.  Eyes: Conjunctivae are normal.  Neck: Neck supple. No thyromegaly present.  Cardiovascular: Normal rate, regular rhythm and normal heart sounds.   No murmur heard. Pulmonary/Chest: Effort normal and breath sounds normal. She has no wheezes.  Abdominal: She exhibits no distension and no mass.  Musculoskeletal: She exhibits no edema.  Lymphadenopathy:    She has no cervical adenopathy.  Neurological: She is alert and oriented to person, place, and time.  Skin: Skin is warm and dry. No rash noted. She is not diaphoretic.  Psychiatric: Memory, affect and judgment normal.    Lab Results  Component Value Date   TSH 1.74  11/19/2013   Lab Results  Component Value Date   WBC 7.1 11/26/2013   HGB 14.4 11/26/2013   HCT 44.4 11/26/2013   MCV 91.5 11/26/2013   PLT 332 11/26/2013   Lab Results  Component Value Date   CREATININE 0.70 11/26/2013   BUN 17 11/26/2013   NA 143 11/26/2013   K 4.3 11/26/2013   CL 105 11/26/2013   CO2 25 11/26/2013   Lab Results  Component Value Date   ALT 14 11/19/2013   AST 19 11/19/2013   ALKPHOS 64 11/19/2013   BILITOT  0.8 11/19/2013   Lab Results  Component Value Date   CHOL 214* 11/19/2013   Lab Results  Component Value Date   HDL 41.10 11/19/2013   Lab Results  Component Value Date   LDLCALC 147* 11/19/2013   Lab Results  Component Value Date   TRIG 130.0 11/19/2013   Lab Results  Component Value Date   CHOLHDL 5 11/19/2013     Assessment & Plan  Essential hypertension Improve don recheck. Well controlled, no changes to meds. Encouraged heart healthy diet such as the DASH diet and exercise as tolerated.    Obesity (BMI 30-39.9) Encouraged DASH diet, decrease po intake and increase exercise as tolerated. Needs 7-8 hours of sleep nightly. Avoid trans fats, eat small, frequent meals every 4-5 hours with lean proteins, complex carbs and healthy fats. Minimize simple carbs   Diabetes mellitus type 2 in obese hgba1c acceptable, minimize simple carbs. Increase exercise as tolerated. Continue current meds   PALPITATIONS No recent flares

## 2014-04-13 ENCOUNTER — Telehealth: Payer: Self-pay | Admitting: Family Medicine

## 2014-04-13 NOTE — Telephone Encounter (Signed)
PA initiated. Awaiting determination. JG//CMA 

## 2014-04-13 NOTE — Telephone Encounter (Signed)
Caller name:Rishawna, W-[employer] Relation to RC:VELF Call back number:[phone] Pharmacy:  Reason for call: pt is needing PA for rx Zostavax (shingles vaccine) PA line to call [facility] [phone], states pt had rx from Dr. Charlett Blake but [facility] needs PA.

## 2014-04-13 NOTE — Telephone Encounter (Signed)
Determination faxed to pharmacy as well

## 2014-04-13 NOTE — Telephone Encounter (Signed)
Received response from [facility]. They state prior auth is not needed at this time for Zostavax. JG//CMA

## 2014-04-14 ENCOUNTER — Telehealth: Payer: Self-pay | Admitting: Family Medicine

## 2014-04-14 ENCOUNTER — Other Ambulatory Visit (INDEPENDENT_AMBULATORY_CARE_PROVIDER_SITE_OTHER): Payer: Medicare Other

## 2014-04-14 DIAGNOSIS — R109 Unspecified abdominal pain: Secondary | ICD-10-CM

## 2014-04-14 NOTE — Telephone Encounter (Signed)
Pt states please a voicemail on cell number advising pt if its okay to drop urine off [facility] [phone]

## 2014-04-14 NOTE — Telephone Encounter (Signed)
Caller name: Novice Relation to pt: self Call back number: [phone] Pharmacy:  Reason for call:   Patient thinks that she has a uti and would like to know if she could just come in to give a urine sample?

## 2014-04-14 NOTE — Telephone Encounter (Signed)
Called the patient and she has seen blood in her urine and abdominal pain. Put order in and scheduled the patient.

## 2014-04-15 LAB — URINALYSIS
Bilirubin Urine: NEGATIVE
Hgb urine dipstick: NEGATIVE
Leukocytes, UA: NEGATIVE
Nitrite: NEGATIVE
Specific Gravity, Urine: >=1.03 — AB (ref 1.000–1.030)
Total Protein, Urine: NEGATIVE
Urine Glucose: NEGATIVE
Urobilinogen, UA: 0.2 (ref 0.0–1.0)
pH: 5.5 (ref 5.0–8.0)

## 2014-04-15 LAB — URINE CULTURE
Colony Count: NO GROWTH
Organism ID, Bacteria: NO GROWTH

## 2014-05-01 ENCOUNTER — Other Ambulatory Visit: Payer: Self-pay | Admitting: Family Medicine

## 2014-05-05 ENCOUNTER — Other Ambulatory Visit: Payer: Self-pay | Admitting: Family Medicine

## 2014-05-06 ENCOUNTER — Ambulatory Visit (INDEPENDENT_AMBULATORY_CARE_PROVIDER_SITE_OTHER): Payer: Medicare Other | Admitting: General Practice

## 2014-05-06 DIAGNOSIS — Z86718 Personal history of other venous thrombosis and embolism: Secondary | ICD-10-CM | POA: Diagnosis not present

## 2014-05-06 DIAGNOSIS — D682 Hereditary deficiency of other clotting factors: Secondary | ICD-10-CM

## 2014-05-06 DIAGNOSIS — Z5181 Encounter for therapeutic drug level monitoring: Secondary | ICD-10-CM

## 2014-05-06 LAB — POCT INR: INR: 1.7

## 2014-05-06 NOTE — Progress Notes (Signed)
Agree with plan 

## 2014-05-06 NOTE — Progress Notes (Signed)
Pre visit review using our clinic review tool, if applicable. No additional management support is needed unless otherwise documented below in the visit note. 

## 2014-05-27 ENCOUNTER — Ambulatory Visit: Payer: Medicare Other

## 2014-06-02 ENCOUNTER — Ambulatory Visit: Payer: Medicare Other

## 2014-06-02 ENCOUNTER — Ambulatory Visit (INDEPENDENT_AMBULATORY_CARE_PROVIDER_SITE_OTHER): Payer: Medicare Other | Admitting: General Practice

## 2014-06-02 DIAGNOSIS — Z5181 Encounter for therapeutic drug level monitoring: Secondary | ICD-10-CM

## 2014-06-02 DIAGNOSIS — Z86718 Personal history of other venous thrombosis and embolism: Secondary | ICD-10-CM

## 2014-06-02 LAB — POCT INR: INR: 2

## 2014-06-02 NOTE — Progress Notes (Signed)
Agree with plan 

## 2014-06-02 NOTE — Progress Notes (Signed)
Pre visit review using our clinic review tool, if applicable. No additional management support is needed unless otherwise documented below in the visit note. 

## 2014-06-11 ENCOUNTER — Telehealth: Payer: Self-pay | Admitting: Family Medicine

## 2014-06-11 DIAGNOSIS — I10 Essential (primary) hypertension: Secondary | ICD-10-CM

## 2014-06-11 NOTE — Telephone Encounter (Signed)
We can switch her back to Losartan 100 mg po daily unless she had trouble with that. Disp #30 with 1 rf and have her come in for bp check in 3-4 weeks. If her symptoms worsen or do not improve then she needs to get looked at

## 2014-06-11 NOTE — Telephone Encounter (Signed)
Relation to pt: self  Call back number: [phone]   Reason for call:  Pt states valsartan (DIOVAN) 160 MG tablet makes her nervous and causes pain in the center of back. Please advise

## 2014-06-12 MED ORDER — LOSARTAN POTASSIUM 100 MG PO TABS
100.0000 mg | ORAL_TABLET | Freq: Every day | ORAL | Status: DC
Start: 2014-06-12 — End: 2014-07-13

## 2014-06-12 NOTE — Telephone Encounter (Signed)
Patient agrees with losartan. Has taken before. Sent Rx to [facility] per patient request.  Patient has follow up appt for 6/13 already scheduled.

## 2014-07-01 ENCOUNTER — Ambulatory Visit (INDEPENDENT_AMBULATORY_CARE_PROVIDER_SITE_OTHER): Payer: Medicare Other | Admitting: *Deleted

## 2014-07-01 DIAGNOSIS — Z86718 Personal history of other venous thrombosis and embolism: Secondary | ICD-10-CM | POA: Diagnosis not present

## 2014-07-01 DIAGNOSIS — D682 Hereditary deficiency of other clotting factors: Secondary | ICD-10-CM

## 2014-07-01 DIAGNOSIS — Z5181 Encounter for therapeutic drug level monitoring: Secondary | ICD-10-CM | POA: Diagnosis not present

## 2014-07-01 LAB — POCT INR: INR: 2

## 2014-07-01 NOTE — Progress Notes (Signed)
I have reviewed and agree with the plan. 

## 2014-07-01 NOTE — Progress Notes (Signed)
Pre visit review using our clinic review tool, if applicable. No additional management support is needed unless otherwise documented below in the visit note. 

## 2014-07-04 ENCOUNTER — Other Ambulatory Visit: Payer: Self-pay | Admitting: Family Medicine

## 2014-07-06 ENCOUNTER — Other Ambulatory Visit: Payer: Self-pay

## 2014-07-06 DIAGNOSIS — C50911 Malignant neoplasm of unspecified site of right female breast: Secondary | ICD-10-CM

## 2014-07-13 ENCOUNTER — Ambulatory Visit (INDEPENDENT_AMBULATORY_CARE_PROVIDER_SITE_OTHER): Payer: Medicare Other | Admitting: Family Medicine

## 2014-07-13 ENCOUNTER — Encounter: Payer: Self-pay | Admitting: Family Medicine

## 2014-07-13 VITALS — BP 138/80 | HR 72 | Temp 98.2°F | Ht 62.5 in | Wt 154.0 lb

## 2014-07-13 DIAGNOSIS — F411 Generalized anxiety disorder: Secondary | ICD-10-CM

## 2014-07-13 DIAGNOSIS — E669 Obesity, unspecified: Secondary | ICD-10-CM

## 2014-07-13 DIAGNOSIS — E785 Hyperlipidemia, unspecified: Secondary | ICD-10-CM | POA: Diagnosis not present

## 2014-07-13 DIAGNOSIS — E119 Type 2 diabetes mellitus without complications: Secondary | ICD-10-CM

## 2014-07-13 DIAGNOSIS — Z86718 Personal history of other venous thrombosis and embolism: Secondary | ICD-10-CM | POA: Diagnosis not present

## 2014-07-13 DIAGNOSIS — E1169 Type 2 diabetes mellitus with other specified complication: Secondary | ICD-10-CM

## 2014-07-13 DIAGNOSIS — I10 Essential (primary) hypertension: Secondary | ICD-10-CM | POA: Diagnosis not present

## 2014-07-13 LAB — COMPREHENSIVE METABOLIC PANEL
ALT: 11 U/L (ref 0–35)
AST: 16 U/L (ref 0–37)
Albumin: 4 g/dL (ref 3.5–5.2)
Alkaline Phosphatase: 68 U/L (ref 39–117)
BUN: 19 mg/dL (ref 6–23)
CO2: 28 mEq/L (ref 19–32)
Calcium: 9.4 mg/dL (ref 8.4–10.5)
Chloride: 105 mEq/L (ref 96–112)
Creatinine, Ser: 0.79 mg/dL (ref 0.40–1.20)
GFR: 74.7 mL/min (ref >60.00)
Glucose, Bld: 105 mg/dL — ABNORMAL HIGH (ref 70–99)
Potassium: 3.9 mEq/L (ref 3.5–5.1)
Sodium: 138 mEq/L (ref 135–145)
Total Bilirubin: 0.7 mg/dL (ref 0.2–1.2)
Total Protein: 7.3 g/dL (ref 6.0–8.3)

## 2014-07-13 LAB — CBC
HCT: 42.3 % (ref 36.0–46.0)
Hemoglobin: 13.7 g/dL (ref 12.0–15.0)
MCHC: 32.5 g/dL (ref 30.0–36.0)
MCV: 90.4 fl (ref 78.0–100.0)
Platelets: 329 10*3/uL (ref 150.0–400.0)
RBC: 4.68 Mil/uL (ref 3.87–5.11)
RDW: 15.2 % (ref 11.5–15.5)
WBC: 5.9 10*3/uL (ref 4.0–10.5)

## 2014-07-13 LAB — LIPID PANEL
Cholesterol: 209 mg/dL — ABNORMAL HIGH (ref 0–200)
HDL: 37.2 mg/dL — ABNORMAL LOW (ref >39.00)
LDL Cholesterol: 153 mg/dL — ABNORMAL HIGH (ref 0–99)
NonHDL: 171.8
Total CHOL/HDL Ratio: 6
Triglycerides: 95 mg/dL (ref 0.0–149.0)
VLDL: 19 mg/dL (ref 0.0–40.0)

## 2014-07-13 LAB — HEMOGLOBIN A1C: Hgb A1c MFr Bld: 6 % (ref 4.6–6.5)

## 2014-07-13 LAB — TSH: TSH: 4.13 u[IU]/mL (ref 0.35–4.50)

## 2014-07-13 MED ORDER — LOSARTAN POTASSIUM 100 MG PO TABS: 100.0000 mg | ORAL_TABLET | Freq: Every day | ORAL | Status: DC

## 2014-07-13 MED ORDER — ESCITALOPRAM OXALATE 10 MG PO TABS: 10.0000 mg | ORAL_TABLET | Freq: Every day | ORAL | Status: DC

## 2014-07-13 NOTE — Progress Notes (Signed)
Pre visit review using our clinic review tool, if applicable. No additional management support is needed unless otherwise documented below in the visit note. 

## 2014-07-13 NOTE — Patient Instructions (Signed)
Probiotic daily such as Digestive Advantage or [place]    Hypertension Hypertension, commonly called high blood pressure, is when the force of blood pumping through your arteries is too strong. Your arteries are the blood vessels that carry blood from your heart throughout your body. A blood pressure reading consists of a higher number over a lower number, such as 110/72. The higher number (systolic) is the pressure inside your arteries when your heart pumps. The lower number (diastolic) is the pressure inside your arteries when your heart relaxes. Ideally you want your blood pressure below 120/80. Hypertension forces your heart to work harder to pump blood. Your arteries may become narrow or stiff. Having hypertension puts you at risk for heart disease, stroke, and other problems.  RISK FACTORS Some risk factors for high blood pressure are controllable. Others are not.  Risk factors you cannot control include:   Race. You may be at higher risk if you are African American.  Age. Risk increases with age.  Gender. Men are at higher risk than women before age 67 years. After age 75, women are at higher risk than men. Risk factors you can control include:  Not getting enough exercise or physical activity.  Being overweight.  Getting too much fat, sugar, calories, or salt in your diet.  Drinking too much alcohol. SIGNS AND SYMPTOMS Hypertension does not usually cause signs or symptoms. Extremely high blood pressure (hypertensive crisis) may cause headache, anxiety, shortness of breath, and nosebleed. DIAGNOSIS  To check if you have hypertension, your health care provider will measure your blood pressure while you are seated, with your arm held at the level of your heart. It should be measured at least twice using the same arm. Certain conditions can cause a difference in blood pressure between your right and left arms. A blood pressure reading that is higher than normal on one  occasion does not mean that you need treatment. If one blood pressure reading is high, ask your health care provider about having it checked again. TREATMENT  Treating high blood pressure includes making lifestyle changes and possibly taking medicine. Living a healthy lifestyle can help lower high blood pressure. You may need to change some of your habits. Lifestyle changes may include:  Following the DASH diet. This diet is high in fruits, vegetables, and whole grains. It is low in salt, red meat, and added sugars.  Getting at least 2 hours of brisk physical activity every week.  Losing weight if necessary.  Not smoking.  Limiting alcoholic beverages.  Learning ways to reduce stress. If lifestyle changes are not enough to get your blood pressure under control, your health care provider may prescribe medicine. You may need to take more than one. Work closely with your health care provider to understand the risks and benefits. HOME CARE INSTRUCTIONS  Have your blood pressure rechecked as directed by your health care provider.   Take medicines only as directed by your health care provider. Follow the directions carefully. Blood pressure medicines must be taken as prescribed. The medicine does not work as well when you skip doses. Skipping doses also puts you at risk for problems.   Do not smoke.   Monitor your blood pressure at home as directed by your health care provider. SEEK MEDICAL CARE IF:   You think you are having a reaction to medicines taken.  You have recurrent headaches or feel dizzy.  You have swelling in your ankles.  You have trouble with your vision.  SEEK IMMEDIATE MEDICAL CARE IF:  You develop a severe headache or confusion.  You have unusual weakness, numbness, or feel faint.  You have severe chest or abdominal pain.  You vomit repeatedly.  You have trouble breathing. MAKE SURE YOU:   Understand these instructions.  Will watch your  condition.  Will get help right away if you are not doing well or get worse. Document Released: 01/16/2005 Document Revised: 06/02/2013 Document Reviewed: 11/08/2012 [facility] Patient Information 2015 [place], [place]. This information is not intended to replace advice given to you by your health care provider. Make sure you discuss any questions you have with your health care provider.

## 2014-07-14 ENCOUNTER — Telehealth: Payer: Self-pay | Admitting: Family Medicine

## 2014-07-14 NOTE — Telephone Encounter (Signed)
Copy of labs mailed to the patients home on 07/14/14.

## 2014-07-14 NOTE — Telephone Encounter (Signed)
Relation to pt: self Call back number: [phone]   Reason for call:   Pt requesting results from 07/13/14 please mail to home.

## 2014-07-19 NOTE — Progress Notes (Signed)
[name] Little  [id number] 28-Jan-1934 07/19/2014      Progress Note-Follow Up  Subjective  Chief Complaint  Chief Complaint  Patient presents with  . Follow-up    3 month    HPI  Patient is a 81 y.o. female in today for routine medical care. Patient is in today for follow-up generally doing well. Reports blood pressures and blood sugars have been well-controlled. Denies polyuria and polydipsia. Is trying to maintain a low carbohydrate heart healthy diet and does well most days. No recent illness. Denies CP/palp/SOB/HA/congestion/fevers/GI or GU c/o. Taking meds as prescribed  Past Medical History  Diagnosis Date  . Personal history of colonic polyps     hyperplastic  . Hemorrhoids   . Thoracic aortic aneurysm   . Paroxysmal supraventricular tachycardia   . Factor V deficiency   . TMJ pain dysfunction syndrome   . DVT (deep venous thrombosis)   . Hyperlipemia   . Blood clotting disorder     V5 factor  . Clostridium difficile infection   . Hypertension   . Infiltrating ductal carcinoma of breast     stage one right   . Blood dyscrasia     factor 5 deficiency  . Arthritis     lumbar stenosis, radiculopathy, OA- L hip  . Clotting disorder   . Anxiety state, unspecified 09/22/2013  . Hyperglycemia 02/26/2014  . Nocturia 02/26/2014  . Low back pain 03/26/2014  . Diabetes mellitus type 2 in obese 02/26/2014    Past Surgical History  Procedure Laterality Date  . Total hip arthroplasty  1999    right  . Laparoscopic hysterectomy    . Splenectomy    . Right calf surgery      for cancer  . Colonoscopy    . Upper gastrointestinal endoscopy    . Joint replacement      R hip replacement   . Breast lumpectomy  2010    right Dr. [name]  . Abdominal hysterectomy      1960- ? vaginal hysterectomy, not laparoscopic   . Eye surgery      bilateral cataracts- /w IOL   . Lumbar laminectomy/decompression microdiscectomy  04/11/2011    Procedure: LUMBAR  LAMINECTOMY/DECOMPRESSION MICRODISCECTOMY;  Surgeon: [name], MD;  Location: [facility];  Service: Neurosurgery;  Laterality: N/A;  Lumbar Five-Sacral One Microdiscectomy    Family History  Problem Relation Age of Onset  . Heart disease Mother   . Heart disease Father   . Heart disease      uncles  . Clotting disorder Maternal Uncle   . Breast cancer Paternal Aunt   . Cancer Paternal Aunt     breast  . Colon cancer Neg Hx   . Anesthesia problems Neg Hx   . Hypotension Neg Hx   . Malignant hyperthermia Neg Hx   . Pseudochol deficiency Neg Hx     History   Social History  . Marital Status: Married    Spouse Name: N/A  . Number of Children: 4  . Years of Education: N/A   Occupational History  . Retired    Social History Main Topics  . Smoking status: Never Smoker   . Smokeless tobacco: Never Used  . Alcohol Use: No  . Drug Use: No  . Sexual Activity: Not on file   Other Topics Concern  . Not on file   Social History Narrative   Married 1955   2 sons - '59, '61, 2 daughters '55, '57; 8 grandchildren;  1 great grand   I-ADLs          Current Outpatient Prescriptions on File Prior to Visit  Medication Sig Dispense Refill  . ALPRAZolam (XANAX) 0.25 MG tablet TAKE 1 ATABLET BY MOUTH TWICE DAILY AS NEEDED FOR ANXIETY 10 tablet 1  . furosemide (LASIX) 20 MG tablet Take 1 tablet (20 mg total) by mouth daily as needed for fluid or edema. 30 tablet 1  . NONFORMULARY OR COMPOUNDED ITEM Compression stockings-- thigh high,  20-30 mm/hg #1 pair as directed 1 each 0  . warfarin (COUMADIN) 5 MG tablet TAKE AS DIRECTED BY COUMADIN CLINIC. 90 tablet 0   No current facility-administered medications on file prior to visit.    Allergies  Allergen Reactions  . Cefuroxime Axetil     REACTION: Ddoes not remember reaction  . Statins     Weakness, pain Lipitor and Pravachol  . Sulfonamide Derivatives     REACTION: Rash    Review of Systems  Review of Systems    Constitutional: Negative for fever and malaise/fatigue.  HENT: Negative for congestion.   Eyes: Negative for discharge.  Respiratory: Negative for shortness of breath.   Cardiovascular: Negative for chest pain, palpitations and leg swelling.  Gastrointestinal: Negative for nausea, abdominal pain and diarrhea.  Genitourinary: Negative for dysuria.  Musculoskeletal: Negative for falls.  Skin: Negative for rash.  Neurological: Negative for loss of consciousness and headaches.  Endo/Heme/Allergies: Negative for polydipsia.  Psychiatric/Behavioral: Negative for depression and suicidal ideas. The patient is not nervous/anxious and does not have insomnia.     Objective  BP 138/80 mmHg  Pulse 72  Temp(Src) 98.2 F (36.8 C) (Oral)  Ht 5' 2.5" (1.588 m)  Wt 154 lb (69.854 kg)  BMI 27.70 kg/m2  SpO2 97%  Physical Exam  Physical Exam  Constitutional: She is oriented to person, place, and time and well-developed, well-nourished, and in no distress. No distress.  HENT:  Head: Normocephalic and atraumatic.  Eyes: Conjunctivae are normal.  Neck: Neck supple. No thyromegaly present.  Cardiovascular: Normal rate, regular rhythm and normal heart sounds.   No murmur heard. Pulmonary/Chest: Effort normal and breath sounds normal. She has no wheezes.  Abdominal: She exhibits no distension and no mass.  Musculoskeletal: She exhibits no edema.  Lymphadenopathy:    She has no cervical adenopathy.  Neurological: She is alert and oriented to person, place, and time.  Skin: Skin is warm and dry. No rash noted. She is not diaphoretic.  Psychiatric: Memory, affect and judgment normal.    Lab Results  Component Value Date   TSH 4.13 07/13/2014   Lab Results  Component Value Date   WBC 5.9 07/13/2014   HGB 13.7 07/13/2014   HCT 42.3 07/13/2014   MCV 90.4 07/13/2014   PLT 329.0 07/13/2014   Lab Results  Component Value Date   CREATININE 0.79 07/13/2014   BUN 19 07/13/2014   NA 138  07/13/2014   K 3.9 07/13/2014   CL 105 07/13/2014   CO2 28 07/13/2014   Lab Results  Component Value Date   ALT 11 07/13/2014   AST 16 07/13/2014   ALKPHOS 68 07/13/2014   BILITOT 0.7 07/13/2014   Lab Results  Component Value Date   CHOL 209* 07/13/2014   Lab Results  Component Value Date   HDL 37.20* 07/13/2014   Lab Results  Component Value Date   LDLCALC 153* 07/13/2014   Lab Results  Component Value Date   TRIG 95.0 07/13/2014  Lab Results  Component Value Date   CHOLHDL 6 07/13/2014     Assessment & Plan  Essential hypertension Well controlled, no changes to meds. Encouraged heart healthy diet such as the DASH diet and exercise as tolerated.   Diabetes mellitus type 2 in obese hgba1c acceptable, minimize simple carbs. Increase exercise as tolerated. Continue current meds  Hyperlipidemia Intolerant to several statins. Encouraged heart healthy diet, increase exercise, avoid trans fats, consider a krill oil cap daily  DVT, HX OF Tolerating coumadin, no recurrence  Anxiety state Doing well on Lexapro

## 2014-07-19 NOTE — Assessment & Plan Note (Signed)
Doing well on Lexapro

## 2014-07-19 NOTE — Assessment & Plan Note (Signed)
Well controlled, no changes to meds. Encouraged heart healthy diet such as the DASH diet and exercise as tolerated.  °

## 2014-07-19 NOTE — Assessment & Plan Note (Signed)
Intolerant to several statins. Encouraged heart healthy diet, increase exercise, avoid trans fats, consider a krill oil cap daily

## 2014-07-19 NOTE — Assessment & Plan Note (Signed)
Tolerating coumadin, no recurrence

## 2014-07-19 NOTE — Assessment & Plan Note (Signed)
hgba1c acceptable, minimize simple carbs. Increase exercise as tolerated. Continue current meds 

## 2014-07-22 ENCOUNTER — Telehealth: Payer: Self-pay | Admitting: General Practice

## 2014-07-22 NOTE — Telephone Encounter (Signed)
Patient would like to know if it is ok to take a certain vitamin, mega red fish oil and krill oil, please advise

## 2014-07-29 ENCOUNTER — Ambulatory Visit: Payer: Medicare Other

## 2014-08-04 ENCOUNTER — Other Ambulatory Visit: Payer: Self-pay | Admitting: Family Medicine

## 2014-08-04 NOTE — Telephone Encounter (Signed)
Faxed hardcopy for Alprazolam to [facility]

## 2014-08-04 NOTE — Telephone Encounter (Signed)
Requesting:  Alprazolam Contract signed on 02/02/14 UDS  None Last OV   07/13/14 Last Refill   #10 with 1 refill on 02/05/14.  Please Advise

## 2014-08-05 ENCOUNTER — Ambulatory Visit (INDEPENDENT_AMBULATORY_CARE_PROVIDER_SITE_OTHER): Payer: Medicare Other | Admitting: General Practice

## 2014-08-05 DIAGNOSIS — Z5181 Encounter for therapeutic drug level monitoring: Secondary | ICD-10-CM | POA: Diagnosis not present

## 2014-08-05 DIAGNOSIS — Z86718 Personal history of other venous thrombosis and embolism: Secondary | ICD-10-CM | POA: Diagnosis not present

## 2014-08-05 DIAGNOSIS — D682 Hereditary deficiency of other clotting factors: Secondary | ICD-10-CM

## 2014-08-05 LAB — POCT INR: INR: 2.5

## 2014-08-05 NOTE — Progress Notes (Signed)
Pre visit review using our clinic review tool, if applicable. No additional management support is needed unless otherwise documented below in the visit note. 

## 2014-08-05 NOTE — Progress Notes (Signed)
I have reviewed and agree with the plan. 

## 2014-08-10 LAB — HM DIABETES EYE EXAM

## 2014-08-13 ENCOUNTER — Other Ambulatory Visit: Payer: Self-pay | Admitting: Family Medicine

## 2014-08-13 DIAGNOSIS — C50911 Malignant neoplasm of unspecified site of right female breast: Secondary | ICD-10-CM

## 2014-08-14 ENCOUNTER — Ambulatory Visit
Admission: RE | Admit: 2014-08-14 | Discharge: 2014-08-14 | Disposition: A | Discharge status: Home / Self Care | Payer: Medicare Other | Source: Ambulatory Visit

## 2014-08-14 DIAGNOSIS — C50911 Malignant neoplasm of unspecified site of right female breast: Secondary | ICD-10-CM

## 2014-08-17 ENCOUNTER — Ambulatory Visit (INDEPENDENT_AMBULATORY_CARE_PROVIDER_SITE_OTHER): Payer: Medicare Other

## 2014-08-17 VITALS — BP 138/60 | HR 75 | Temp 98.0°F | Ht 63.0 in | Wt 154.8 lb

## 2014-08-17 DIAGNOSIS — Z780 Asymptomatic menopausal state: Secondary | ICD-10-CM | POA: Diagnosis not present

## 2014-08-17 DIAGNOSIS — Z0000 Encounter for general adult medical examination without abnormal findings: Secondary | ICD-10-CM

## 2014-08-17 NOTE — Patient Instructions (Addendum)
Take walks in the park at least 3 days at out of the week.    Continue your diet to lose weight, improve blood sugars, and lower cholesterol.  Way to go on losing 21 lbs!!!  You are doing amazing!   Schedule Dexa Scan.       Bone Densitometry Bone densitometry is a special X-ray that measures your bone density and can be used to help predict your risk of bone fractures. This test is used to determine bone mineral content and density to diagnose osteoporosis. Osteoporosis is the loss of bone that may cause the bone to become weak. Osteoporosis commonly occurs in women entering menopause. However, it may be found in men and in people with other diseases. PREPARATION FOR TEST No preparation necessary. WHO SHOULD BE TESTED?  All women older than 51.  Postmenopausal women (50 to 30) with risk factors for osteoporosis.  People with a previous fracture caused by normal activities.  People with a small body frame (less than 127 poundsor a body mass index [BMI] of less than 21).  People who have a parent with a hip fracture or history of osteoporosis.  People who smoke.  People who have rheumatoid arthritis.  Anyone who engages in excessive alcohol use (more than 3 drinks most days).  Women who experience early menopause. WHEN SHOULD YOU BE RETESTED? Current guidelines suggest that you should wait at least 2 years before doing a bone density test again if your first test was normal.Recent studies indicated that women with normal bone density may be able to wait a few years before needing to repeat a bone density test. You should discuss this with your caregiver.  NORMAL FINDINGS   Normal: less than standard deviation below normal (greater than -1).  Osteopenia: 1 to 2.5 standard deviations below normal (-1 to -2.5).  Osteoporosis: greater than 2.5 standard deviations below normal (less than -2.5). Test results are reported as a "T score" and a "Z score."The T score is a number  that compares your bone density with the bone density of healthy, young women.The Z score is a number that compares your bone density with the scores of women who are the same age, gender, and race.  Ranges for normal findings may vary among different laboratories and hospitals. You should always check with your doctor after having lab work or other tests done to discuss the meaning of your test results and whether your values are considered within normal limits. MEANING OF TEST  Your caregiver will go over the test results with you and discuss the importance and meaning of your results, as well as treatment options and the need for additional tests if necessary. OBTAINING THE TEST RESULTS It is your responsibility to obtain your test results. Ask the lab or department performing the test when and how you will get your results. Document Released: 02/08/2004 Document Revised: 04/10/2011 Document Reviewed: 03/02/2010 [facility] Patient Information 2015 [place], [place]. This information is not intended to replace advice given to you by your health care provider. Make sure you discuss any questions you have with your health care provider.  Fat and Cholesterol Control Diet Fat and cholesterol levels in your blood and organs are influenced by your diet. High levels of fat and cholesterol may lead to diseases of the heart, small and large blood vessels, gallbladder, liver, and pancreas. CONTROLLING FAT AND CHOLESTEROL WITH DIET Although exercise and lifestyle factors are important, your diet is key. That is because certain foods are known to  raise cholesterol and others to lower it. The goal is to balance foods for their effect on cholesterol and more importantly, to replace saturated and trans fat with other types of fat, such as monounsaturated fat, polyunsaturated fat, and omega-3 fatty acids. On average, a person should consume no more than 15 to 17 g of saturated fat daily. Saturated and trans fats are  considered "bad" fats, and they will raise LDL cholesterol. Saturated fats are primarily found in animal products such as meats, butter, and cream. However, that does not mean you need to give up all your favorite foods. Today, there are good tasting, low-fat, low-cholesterol substitutes for most of the things you like to eat. Choose low-fat or nonfat alternatives. Choose round or loin cuts of red meat. These types of cuts are lowest in fat and cholesterol. Chicken (without the skin), fish, veal, and ground [place] breast are great choices. Eliminate fatty meats, such as hot dogs and salami. Even shellfish have little or no saturated fat. Have a 3 oz (85 g) portion when you eat lean meat, poultry, or fish. Trans fats are also called "partially hydrogenated oils." They are oils that have been scientifically manipulated so that they are solid at room temperature resulting in a longer shelf life and improved taste and texture of foods in which they are added. Trans fats are found in stick margarine, some tub margarines, cookies, crackers, and baked goods.  When baking and cooking, oils are a great substitute for butter. The monounsaturated oils are especially beneficial since it is believed they lower LDL and raise HDL. The oils you should avoid entirely are saturated tropical oils, such as coconut and palm.  Remember to eat a lot from food groups that are naturally free of saturated and trans fat, including fish, fruit, vegetables, beans, grains (barley, rice, couscous, bulgur wheat), and pasta (without cream sauces).  IDENTIFYING FOODS THAT LOWER FAT AND CHOLESTEROL  Soluble fiber may lower your cholesterol. This type of fiber is found in fruits such as apples, vegetables such as broccoli, potatoes, and carrots, legumes such as beans, peas, and lentils, and grains such as barley. Foods fortified with plant sterols (phytosterol) may also lower cholesterol. You should eat at least 2 g per day of these foods for a  cholesterol lowering effect.  Read package labels to identify low-saturated fats, trans fat free, and low-fat foods at the supermarket. Select cheeses that have only 2 to 3 g saturated fat per ounce. Use a heart-healthy tub margarine that is free of trans fats or partially hydrogenated oil. When buying baked goods (cookies, crackers), avoid partially hydrogenated oils. Breads and muffins should be made from whole grains (whole-wheat or whole oat flour, instead of "flour" or "enriched flour"). Buy non-creamy canned soups with reduced salt and no added fats.  FOOD PREPARATION TECHNIQUES  Never deep-fry. If you must fry, either stir-fry, which uses very little fat, or use non-stick cooking sprays. When possible, broil, bake, or roast meats, and steam vegetables. Instead of putting butter or margarine on vegetables, use lemon and herbs, applesauce, and cinnamon (for squash and sweet potatoes). Use nonfat yogurt, salsa, and low-fat dressings for salads.  LOW-SATURATED FAT / LOW-FAT FOOD SUBSTITUTES Meats / Saturated Fat (g)  Avoid: Steak, marbled (3 oz/85 g) / 11 g  Choose: Steak, lean (3 oz/85 g) / 4 g  Avoid: Hamburger (3 oz/85 g) / 7 g  Choose: Hamburger, lean (3 oz/85 g) / 5 g  Avoid: Ham (3 oz/85 g) /  6 g  Choose: Ham, lean cut (3 oz/85 g) / 2.4 g  Avoid: Chicken, with skin, dark meat (3 oz/85 g) / 4 g  Choose: Chicken, skin removed, dark meat (3 oz/85 g) / 2 g  Avoid: Chicken, with skin, light meat (3 oz/85 g) / 2.5 g  Choose: Chicken, skin removed, light meat (3 oz/85 g) / 1 g Dairy / Saturated Fat (g)  Avoid: Whole milk (1 cup) / 5 g  Choose: Low-fat milk, 2% (1 cup) / 3 g  Choose: Low-fat milk, 1% (1 cup) / 1.5 g  Choose: Skim milk (1 cup) / 0.3 g  Avoid: Hard cheese (1 oz/28 g) / 6 g  Choose: Skim milk cheese (1 oz/28 g) / 2 to 3 g  Avoid: Cottage cheese, 4% fat (1 cup) / 6.5 g  Choose: Low-fat cottage cheese, 1% fat (1 cup) / 1.5 g  Avoid: Ice cream (1 cup) / 9  g  Choose: Sherbet (1 cup) / 2.5 g  Choose: Nonfat frozen yogurt (1 cup) / 0.3 g  Choose: Frozen fruit bar / trace  Avoid: Whipped cream (1 tbs) / 3.5 g  Choose: Nondairy whipped topping (1 tbs) / 1 g Condiments / Saturated Fat (g)  Avoid: Mayonnaise (1 tbs) / 2 g  Choose: Low-fat mayonnaise (1 tbs) / 1 g  Avoid: Butter (1 tbs) / 7 g  Choose: Extra light margarine (1 tbs) / 1 g  Avoid: Coconut oil (1 tbs) / 11.8 g  Choose: Olive oil (1 tbs) / 1.8 g  Choose: Corn oil (1 tbs) / 1.7 g  Choose: Safflower oil (1 tbs) / 1.2 g  Choose: Sunflower oil (1 tbs) / 1.4 g  Choose: Soybean oil (1 tbs) / 2.4 g  Choose: Canola oil (1 tbs) / 1 g Document Released: 01/16/2005 Document Revised: 05/13/2012 Document Reviewed: 04/16/2013 [employer] Patient Information 2015 [place], [place]. This information is not intended to replace advice given to you by your health care provider. Make sure you discuss any questions you have with your health care provider.  Fat and Cholesterol Control Diet Your diet has an affect on your fat and cholesterol levels in your blood and organs. Too much fat and cholesterol in your blood can affect your:  Heart.  Blood vessels (arteries, veins).  Gallbladder.  Liver.  Pancreas. CONTROL FAT AND CHOLESTEROL WITH DIET Certain foods raise cholesterol and others lower it. It is important to replace bad fats with other types of fat.  Do not eat:  Fatty meats, such as hot dogs and salami.  Stick margarine and some tub margarines that have "partially hydrogenated oils" in them.  Baked goods, such as cookies and crackers that have "partially hydrogenated oils" in them.  Saturated tropical oils, such as coconut and palm oil. Eat the following foods:  Round or loin cuts of red meat.  Chicken (without skin).  Fish.  Veal.  Ground [place] breast.  Shellfish.  Fruit, such as apples.  Vegetables, such as broccoli, potatoes, and carrots.  Beans, peas,  and lentils (legumes).  Grains, such as barley, rice, couscous, and bulgar wheat.  Pasta (without cream sauces). Look for foods that are nonfat, low in fat, and low in cholesterol.  FIND FOODS THAT ARE LOWER IN FAT AND CHOLESTEROL  Find foods with soluble fiber and plant sterols (phytosterol). You should eat 2 grams a day of these foods. These foods include:  Fruits.  Vegetables.  Whole grains.  Dried beans and peas.  Nuts  and seeds.  Read package labels. Look for low-saturated fats, trans fat free, low-fat foods.  Choose cheese that have only 2 to 3 grams of saturated fat per ounce.  Use heart-healthy tub margarine that is free of trans fat or partially hydrogenated oil.  Avoid buying baked goods that have partially hydrogenated oils in them. Instead, buy baked goods made with whole grains (whole-wheat or whole oat flour). Avoid baked goods labeled with "flour" or "enriched flour."  Buy non-creamy canned soups with reduced salt and no added fats. PREPARING YOUR FOOD  Broil, bake, steam, or roast foods. Do not fry food.  Use non-stick cooking sprays.  Use lemon or herbs to flavor food instead of using butter or stick margarine.  Use nonfat yogurt, salsa, or low-fat dressings for salads. LOW-SATURATED FAT / LOW-FAT FOOD SUBSTITUTES  Meats / Saturated Fat (g)  Avoid: Steak, marbled (3 oz/85 g) / 11 g.  Choose: Steak, lean (3 oz/85 g) / 4 g.  Avoid: Hamburger (3 oz/85 g) / 7 g.  Choose: Hamburger, lean (3 oz/85 g) / 5 g.  Avoid: Ham (3 oz/85 g) / 6 g.  Choose: Ham, lean cut (3 oz/85 g) / 2.4 g.  Avoid: Chicken, with skin, dark meat (3 oz/85 g) / 4 g.  Choose: Chicken, skin removed, dark meat (3 oz/85 g) / 2 g.  Avoid: Chicken, with skin, light meat (3 oz/85 g) / 2.5 g.  Choose: Chicken, skin removed, light meat (3 oz/85 g) / 1 g. Dairy / Saturated Fat (g)  Avoid: Whole milk (1 cup) / 5 g.  Choose: Low-fat milk, 2% (1 cup) / 3 g.  Choose: Low-fat milk, 1%  (1 cup) / 1.5 g.  Choose: Skim milk (1 cup) / 0.3 g.  Avoid: Hard cheese (1 oz/28 g) / 6 g.  Choose: Skim milk cheese (1 oz/28 g) / 2 to 3 g.  Avoid: Cottage cheese, 4% fat (1 cup) / 6.5 g.  Choose: Low-fat cottage cheese, 1% fat (1 cup) / 1.5 g.  Avoid: Ice cream (1 cup) / 9 g.  Choose: Sherbet (1 cup) / 2.5 g.  Choose: Nonfat frozen yogurt (1 cup) / 0.3 g.  Choose: Frozen fruit bar / trace.  Avoid: Whipped cream (1 tbs) / 3.5 g.  Choose: Nondairy whipped topping (1 tbs) / 1 g. Condiments / Saturated Fat (g)  Avoid: Mayonnaise (1 tbs) / 2 g.  Choose: Low-fat mayonnaise (1 tbs) / 1 g.  Avoid: Butter (1 tbs) / 7 g.  Choose: Extra light margarine (1 tbs) / 1 g.  Avoid: Coconut oil (1 tbs) / 11.8 g.  Choose: Olive oil (1 tbs) / 1.8 g.  Choose: Corn oil (1 tbs) / 1.7 g.  Choose: Safflower oil (1 tbs) / 1.2 g.  Choose: Sunflower oil (1 tbs) / 1.4 g.  Choose: Soybean oil (1 tbs) / 2.4 g .  Choose: Canola oil (1 tbs) / 1 g. Document Released: 07/18/2011 Document Revised: 09/18/2012 Document Reviewed: 04/17/2013 [facility] Patient Information 2015 [place], [place]. This information is not intended to replace advice given to you by your health care provider. Make sure you discuss any questions you have with your health care provider.  Health Maintenance Adopting a healthy lifestyle and getting preventive care can go a long way to promote health and wellness. Talk with your health care provider about what schedule of regular examinations is right for you. This is a good chance for you to check in with your provider about  disease prevention and staying healthy. In between checkups, there are plenty of things you can do on your own. Experts have done a lot of research about which lifestyle changes and preventive measures are most likely to keep you healthy. Ask your health care provider for more information. WEIGHT AND DIET  Eat a healthy diet  Be sure to include plenty of  vegetables, fruits, low-fat dairy products, and lean protein.  Do not eat a lot of foods high in solid fats, added sugars, or salt.  Get regular exercise. This is one of the most important things you can do for your health.  Most adults should exercise for at least 150 minutes each week. The exercise should increase your heart rate and make you sweat (moderate-intensity exercise).  Most adults should also do strengthening exercises at least twice a week. This is in addition to the moderate-intensity exercise.  Maintain a healthy weight  Body mass index (BMI) is a measurement that can be used to identify possible weight problems. It estimates body fat based on height and weight. Your health care provider can help determine your BMI and help you achieve or maintain a healthy weight.  For females 35 years of age and older:   A BMI below 18.5 is considered underweight.  A BMI of 18.5 to 24.9 is normal.  A BMI of 25 to 29.9 is considered overweight.  A BMI of 30 and above is considered obese.  Watch levels of cholesterol and blood lipids  You should start having your blood tested for lipids and cholesterol at 79 years of age, then have this test every 5 years.  You may need to have your cholesterol levels checked more often if:  Your lipid or cholesterol levels are high.  You are older than 79 years of age.  You are at high risk for heart disease.  CANCER SCREENING   Lung Cancer  Lung cancer screening is recommended for adults 22-65 years old who are at high risk for lung cancer because of a history of smoking.  A yearly low-dose CT scan of the lungs is recommended for people who:  Currently smoke.  Have quit within the past 15 years.  Have at least a 30-pack-year history of smoking. A pack year is smoking an average of one pack of cigarettes a day for 1 year.  Yearly screening should continue until it has been 15 years since you quit.  Yearly screening should stop if  you develop a health problem that would prevent you from having lung cancer treatment.  Breast Cancer  Practice breast self-awareness. This means understanding how your breasts normally appear and feel.  It also means doing regular breast self-exams. Let your health care provider know about any changes, no matter how small.  If you are in your 20s or 30s, you should have a clinical breast exam (CBE) by a health care provider every 1-3 years as part of a regular health exam.  If you are 60 or older, have a CBE every year. Also consider having a breast X-ray (mammogram) every year.  If you have a family history of breast cancer, talk to your health care provider about genetic screening.  If you are at high risk for breast cancer, talk to your health care provider about having an MRI and a mammogram every year.  Breast cancer gene (BRCA) assessment is recommended for women who have family members with BRCA-related cancers. BRCA-related cancers include:  Breast.  Ovarian.  Tubal.  Peritoneal  cancers.  Results of the assessment will determine the need for genetic counseling and BRCA1 and BRCA2 testing. Cervical Cancer Routine pelvic examinations to screen for cervical cancer are no longer recommended for nonpregnant women who are considered low risk for cancer of the pelvic organs (ovaries, uterus, and vagina) and who do not have symptoms. A pelvic examination may be necessary if you have symptoms including those associated with pelvic infections. Ask your health care provider if a screening pelvic exam is right for you.   The Pap test is the screening test for cervical cancer for women who are considered at risk.  If you had a hysterectomy for a problem that was not cancer or a condition that could lead to cancer, then you no longer need Pap tests.  If you are older than 65 years, and you have had normal Pap tests for the past 10 years, you no longer need to have Pap tests.  If you  have had past treatment for cervical cancer or a condition that could lead to cancer, you need Pap tests and screening for cancer for at least 20 years after your treatment.  If you no longer get a Pap test, assess your risk factors if they change (such as having a new sexual partner). This can affect whether you should start being screened again.  Some women have medical problems that increase their chance of getting cervical cancer. If this is the case for you, your health care provider may recommend more frequent screening and Pap tests.  The human papillomavirus (HPV) test is another test that may be used for cervical cancer screening. The HPV test looks for the virus that can cause cell changes in the cervix. The cells collected during the Pap test can be tested for HPV.  The HPV test can be used to screen women 57 years of age and older. Getting tested for HPV can extend the interval between normal Pap tests from three to five years.  An HPV test also should be used to screen women of any age who have unclear Pap test results.  After 79 years of age, women should have HPV testing as often as Pap tests.  Colorectal Cancer  This type of cancer can be detected and often prevented.  Routine colorectal cancer screening usually begins at 79 years of age and continues through 79 years of age.  Your health care provider may recommend screening at an earlier age if you have risk factors for colon cancer.  Your health care provider may also recommend using home test kits to check for hidden blood in the stool.  A small camera at the end of a tube can be used to examine your colon directly (sigmoidoscopy or colonoscopy). This is done to check for the earliest forms of colorectal cancer.  Routine screening usually begins at age 41.  Direct examination of the colon should be repeated every 5-10 years through 79 years of age. However, you may need to be screened more often if early forms of  precancerous polyps or small growths are found. Skin Cancer  Check your skin from head to toe regularly.  Tell your health care provider about any new moles or changes in moles, especially if there is a change in a mole's shape or color.  Also tell your health care provider if you have a mole that is larger than the size of a pencil eraser.  Always use sunscreen. Apply sunscreen liberally and repeatedly throughout the day.  Protect  yourself by wearing long sleeves, pants, a wide-brimmed hat, and sunglasses whenever you are outside. HEART DISEASE, DIABETES, AND HIGH BLOOD PRESSURE   Have your blood pressure checked at least every 1-2 years. High blood pressure causes heart disease and increases the risk of stroke.  If you are between 13 years and 70 years old, ask your health care provider if you should take aspirin to prevent strokes.  Have regular diabetes screenings. This involves taking a blood sample to check your fasting blood sugar level.  If you are at a normal weight and have a low risk for diabetes, have this test once every three years after 79 years of age.  If you are overweight and have a high risk for diabetes, consider being tested at a younger age or more often. PREVENTING INFECTION  Hepatitis B  If you have a higher risk for hepatitis B, you should be screened for this virus. You are considered at high risk for hepatitis B if:  You were born in a country where hepatitis B is common. Ask your health care provider which countries are considered high risk.  Your parents were born in a high-risk country, and you have not been immunized against hepatitis B (hepatitis B vaccine).  You have HIV or AIDS.  You use needles to inject street drugs.  You live with someone who has hepatitis B.  You have had sex with someone who has hepatitis B.  You get hemodialysis treatment.  You take certain medicines for conditions, including cancer, organ transplantation, and  autoimmune conditions. Hepatitis C  Blood testing is recommended for:  Everyone born from [birth year] through 1963.  Anyone with known risk factors for hepatitis C. Sexually transmitted infections (STIs)  You should be screened for sexually transmitted infections (STIs) including gonorrhea and chlamydia if:  You are sexually active and are younger than 79 years of age.  You are older than 79 years of age and your health care provider tells you that you are at risk for this type of infection.  Your sexual activity has changed since you were last screened and you are at an increased risk for chlamydia or gonorrhea. Ask your health care provider if you are at risk.  If you do not have HIV, but are at risk, it may be recommended that you take a prescription medicine daily to prevent HIV infection. This is called pre-exposure prophylaxis (PrEP). You are considered at risk if:  You are sexually active and do not regularly use condoms or know the HIV status of your partner(s).  You take drugs by injection.  You are sexually active with a partner who has HIV. Talk with your health care provider about whether you are at high risk of being infected with HIV. If you choose to begin PrEP, you should first be tested for HIV. You should then be tested every 3 months for as long as you are taking PrEP.  PREGNANCY   If you are premenopausal and you may become pregnant, ask your health care provider about preconception counseling.  If you may become pregnant, take 400 to 800 micrograms (mcg) of folic acid every day.  If you want to prevent pregnancy, talk to your health care provider about birth control (contraception). OSTEOPOROSIS AND MENOPAUSE   Osteoporosis is a disease in which the bones lose minerals and strength with aging. This can result in serious bone fractures. Your risk for osteoporosis can be identified using a bone density scan.  If you are 73  years of age or older, or if you are at risk  for osteoporosis and fractures, ask your health care provider if you should be screened.  Ask your health care provider whether you should take a calcium or vitamin D supplement to lower your risk for osteoporosis.  Menopause may have certain physical symptoms and risks.  Hormone replacement therapy may reduce some of these symptoms and risks. Talk to your health care provider about whether hormone replacement therapy is right for you.  HOME CARE INSTRUCTIONS   Schedule regular health, dental, and eye exams.  Stay current with your immunizations.   Do not use any tobacco products including cigarettes, chewing tobacco, or electronic cigarettes.  If you are pregnant, do not drink alcohol.  If you are breastfeeding, limit how much and how often you drink alcohol.  Limit alcohol intake to no more than 1 drink per day for nonpregnant women. One drink equals 12 ounces of beer, 5 ounces of wine, or 1 ounces of hard liquor.  Do not use street drugs.  Do not share needles.  Ask your health care provider for help if you need support or information about quitting drugs.  Tell your health care provider if you often feel depressed.  Tell your health care provider if you have ever been abused or do not feel safe at home. Document Released: 08/01/2010 Document Revised: 06/02/2013 Document Reviewed: 12/18/2012 [facility] Patient Information 2015 [place], [place]. This information is not intended to replace advice given to you by your health care provider. Make sure you discuss any questions you have with your health care provider.

## 2014-08-17 NOTE — Progress Notes (Signed)
Subjective:   [name] Little is a 81 y.o. female who presents for an Initial Medicare Annual Wellness Visit.  She described her health as good.  Her only concern is her eyes.  She has macular degeneration is will be seeing a specialist on Friday, August 21, 2014.    Review of Systems    Risk Factors:  DM II- managing with diet  Hypertension- on Losartan Hyperlipidemia- allergy to statins; managing with diet  Sleep patterns:  5 hours per night.  Wakes up feeling rested.    Cognitive: MMSE score 29/30 Vision:  Wear glasses Fall Risk: LOW fall risk   ADLs (bathing, grooming/dressing, walking, toileting): Independent IADLs (shopping, housekeeping, medication management, handling finances):  Independent  Urinary Incontinence:  No leaks, unless she waits too long to use bathroom.  She wakes up at least once during the night.   Home Safety/Smoke Alarms: Feels safe.   Lives in a 2 story home with husband.  Smoke detectors and alarm system in home.  [place].   Firearm Safety: Firearms are safely kept.   Seat Belt Safety/Bike Helmet:  Always wears seat belt.     Objective:    Today's Vitals   08/17/14 0849 08/17/14 0929  BP: 156/80 138/60  Pulse: 75   Temp: 98 F (36.7 C)   TempSrc: Oral   Height: 5\' 3"  (1.6 m)   Weight: 154 lb 12.8 oz (70.217 kg)   SpO2: 97%   PainSc: 0-No pain     Current Medications (verified) Outpatient Encounter Prescriptions as of 08/17/2014  Medication Sig  . ALPRAZolam (XANAX) 0.25 MG tablet TAKE 1 TABLET BY MOUTH TWICE DAILY AS NEEDED FOR ANXIETY  . escitalopram (LEXAPRO) 10 MG tablet Take 1 tablet (10 mg total) by mouth daily.  . furosemide (LASIX) 20 MG tablet Take 1 tablet (20 mg total) by mouth daily as needed for fluid or edema.  Marland Kitchen losartan (COZAAR) 100 MG tablet Take 1 tablet (100 mg total) by mouth daily.  Marland Kitchen warfarin (COUMADIN) 5 MG tablet TAKE AS DIRECTED BY COUMADIN CLINIC.  . [DISCONTINUED] NONFORMULARY OR COMPOUNDED ITEM  Compression stockings-- thigh high,  20-30 mm/hg #1 pair as directed (Patient not taking: Reported on 08/17/2014)   No facility-administered encounter medications on file as of 08/17/2014.    Allergies (verified) Cefuroxime axetil; Statins; and Sulfonamide derivatives   History: Past Medical History  Diagnosis Date  . Personal history of colonic polyps     hyperplastic  . Hemorrhoids   . Thoracic aortic aneurysm   . Paroxysmal supraventricular tachycardia   . Factor V deficiency   . TMJ pain dysfunction syndrome   . DVT (deep venous thrombosis)   . Hyperlipemia   . Blood clotting disorder     V5 factor  . Clostridium difficile infection   . Hypertension   . Infiltrating ductal carcinoma of breast     stage one right   . Blood dyscrasia     factor 5 deficiency  . Arthritis     lumbar stenosis, radiculopathy, OA- L hip  . Clotting disorder   . Anxiety state, unspecified 09/22/2013  . Hyperglycemia 02/26/2014  . Nocturia 02/26/2014  . Low back pain 03/26/2014  . Diabetes mellitus type 2 in obese 02/26/2014  . Macular degeneration    Past Surgical History  Procedure Laterality Date  . Total hip arthroplasty  1999    right  . Laparoscopic hysterectomy    . Splenectomy    . Right calf  surgery      for cancer  . Colonoscopy    . Upper gastrointestinal endoscopy    . Joint replacement      R hip replacement   . Breast lumpectomy  2010    right Dr. [name]  . Abdominal hysterectomy      1960- ? vaginal hysterectomy, not laparoscopic   . Eye surgery      bilateral cataracts- /w IOL   . Lumbar laminectomy/decompression microdiscectomy  04/11/2011    Procedure: LUMBAR LAMINECTOMY/DECOMPRESSION MICRODISCECTOMY;  Surgeon: [name], MD;  Location: [place] [facility];  Service: Neurosurgery;  Laterality: N/A;  Lumbar Five-Sacral One Microdiscectomy   Family History  Problem Relation Age of Onset  . Heart disease Mother   . Heart disease Father   . Heart disease      uncles  .  Clotting disorder Maternal Uncle   . Breast cancer Paternal Aunt   . Cancer Paternal Aunt     breast  . Colon cancer Neg Hx   . Anesthesia problems Neg Hx   . Hypotension Neg Hx   . Malignant hyperthermia Neg Hx   . Pseudochol deficiency Neg Hx    Social History   Occupational History  . Retired    Social History Main Topics  . Smoking status: Never Smoker   . Smokeless tobacco: Never Used  . Alcohol Use: No  . Drug Use: No  . Sexual Activity:    Partners: Male    Tobacco Counseling Counseling given: Not Answered   Activities of Daily Living In your present state of health, do you have any difficulty performing the following activities: 08/17/2014 07/13/2014  Hearing? N N  Vision? N N  Difficulty concentrating or making decisions? N N  Walking or climbing stairs? N N  Dressing or bathing? N N  Doing errands, shopping? N N    Immunizations and Health Maintenance Immunization History  Administered Date(s) Administered  . Influenza Split 11/14/2010  . Influenza Whole 11/08/2005, 10/28/2007, 11/23/2008, 11/26/2009  . Influenza,inj,Quad PF,36+ Mos 11/26/2012, 09/22/2013  . Pneumococcal Conjugate-13 04/01/2013  . Pneumococcal Polysaccharide-23 10/30/2001, 07/27/2009  . Td 11/27/2001  . Tetanus 04/01/2013   Health Maintenance Due  Topic Date Due  . URINE MICROALBUMIN  12/13/1945  . DEXA SCAN  12/13/2000    Patient Care Team: Mosie Lukes, MD as PCP - General (Family Medicine) Rutherford Guys, MD as Consulting Physician (Ophthalmology) Hurman Horn, MD as Consulting Physician (Ophthalmology)  Indicate any recent Medical Services you may have received from other than [facility] providers in the past year (date may be approximate).     Assessment:   This is a routine wellness examination for [name] Little.   Hearing/Vision screen Hearing- Able to hear conversational tones and whispers from 6 ft away.   Vision-  Wears corrective glasses, last eye exam- last week   Dietary  issues and exercise activities discussed:  Diet (meal preparation, eat out, water intake, caffeinated beverages, dairy products, fruits and vegetables):   She has reduced carb, simple sugars, and fried foods from diet.  She consumes grilled chicken and salmon, fruit/vegatables, oatmeal, whole wheat bread, unsweet tea, drinks lots of water, not much dairy products.    Physical Activity/Exercise:  Gardening and walks around the yard.     Goals    . Increase physical activity     Increase walking--walk in neighborhood park       Depression Screen [facility] 2/9 Scores 08/17/2014 07/13/2014 06/09/2013  PHQ - 2 Score 0  0 0    Fall Risk Fall Risk  08/17/2014 07/13/2014 06/09/2013  Falls in the past year? No No No    Cognitive Function: No flowsheet data found.  Screening Tests Health Maintenance  Topic Date Due  . URINE MICROALBUMIN  12/13/1945  . DEXA SCAN  12/13/2000  . ZOSTAVAX  11/06/2014 (Originally 12/14/1995)  . INFLUENZA VACCINE  08/31/2014  . HEMOGLOBIN A1C  01/12/2015  . OPHTHALMOLOGY EXAM  08/10/2015  . FOOT EXAM  08/17/2015  . TETANUS/TDAP  04/02/2023  . PNA vac Low Risk Adult  Completed      Plan:  Encouraged to continue current diet and to increase physical active.    During the course of the visit, [name] Little was educated and counseled about the following appropriate screening and preventive services:   Vaccines to include Pneumoccal, Influenza, Hepatitis B, Td, Zostavax, HCV  Electrocardiogram  Cardiovascular disease screening  Colorectal cancer screening  Bone density screening  Diabetes screening  Glaucoma screening  Mammography/PAP  Nutrition counseling  Smoking cessation counseling  Patient Instructions (the written plan) were given to the patient.    Rudene Anda, RN   08/17/2014

## 2014-08-18 ENCOUNTER — Telehealth: Payer: Self-pay | Admitting: *Deleted

## 2014-08-18 NOTE — Telephone Encounter (Signed)
Completed/signed disability parking placard mailed to pt's home address (per pt request) today. Copy sent for scanning. JG//CMA

## 2014-09-15 ENCOUNTER — Ambulatory Visit (INDEPENDENT_AMBULATORY_CARE_PROVIDER_SITE_OTHER): Payer: Medicare Other | Admitting: General Practice

## 2014-09-15 DIAGNOSIS — Z86718 Personal history of other venous thrombosis and embolism: Secondary | ICD-10-CM

## 2014-09-15 DIAGNOSIS — Z5181 Encounter for therapeutic drug level monitoring: Secondary | ICD-10-CM

## 2014-09-15 DIAGNOSIS — D682 Hereditary deficiency of other clotting factors: Secondary | ICD-10-CM

## 2014-09-15 LAB — POCT INR: INR: 1.4

## 2014-09-15 NOTE — Progress Notes (Signed)
I have reviewed and agree with the plan. 

## 2014-09-15 NOTE — Progress Notes (Signed)
Pre visit review using our clinic review tool, if applicable. No additional management support is needed unless otherwise documented below in the visit note. 

## 2014-09-29 ENCOUNTER — Ambulatory Visit (INDEPENDENT_AMBULATORY_CARE_PROVIDER_SITE_OTHER): Payer: Medicare Other | Admitting: General Practice

## 2014-09-29 DIAGNOSIS — Z86718 Personal history of other venous thrombosis and embolism: Secondary | ICD-10-CM

## 2014-09-29 DIAGNOSIS — Z5181 Encounter for therapeutic drug level monitoring: Secondary | ICD-10-CM | POA: Diagnosis not present

## 2014-09-29 DIAGNOSIS — D682 Hereditary deficiency of other clotting factors: Secondary | ICD-10-CM | POA: Diagnosis not present

## 2014-09-29 LAB — POCT INR: INR: 2.1

## 2014-09-29 NOTE — Progress Notes (Signed)
Pre visit review using our clinic review tool, if applicable. No additional management support is needed unless otherwise documented below in the visit note. 

## 2014-09-29 NOTE — Progress Notes (Signed)
I have reviewed and agree with the plan. 

## 2014-10-12 ENCOUNTER — Other Ambulatory Visit: Payer: Self-pay | Admitting: Family Medicine

## 2014-10-20 ENCOUNTER — Ambulatory Visit (INDEPENDENT_AMBULATORY_CARE_PROVIDER_SITE_OTHER): Payer: Medicare Other | Admitting: Family Medicine

## 2014-10-20 ENCOUNTER — Encounter: Payer: Self-pay | Admitting: Family Medicine

## 2014-10-20 VITALS — BP 158/80 | HR 65 | Temp 98.5°F | Ht 63.0 in | Wt 153.4 lb

## 2014-10-20 DIAGNOSIS — R002 Palpitations: Secondary | ICD-10-CM | POA: Diagnosis not present

## 2014-10-20 DIAGNOSIS — Z23 Encounter for immunization: Secondary | ICD-10-CM | POA: Diagnosis not present

## 2014-10-20 DIAGNOSIS — E785 Hyperlipidemia, unspecified: Secondary | ICD-10-CM

## 2014-10-20 DIAGNOSIS — I10 Essential (primary) hypertension: Secondary | ICD-10-CM

## 2014-10-20 DIAGNOSIS — E119 Type 2 diabetes mellitus without complications: Secondary | ICD-10-CM

## 2014-10-20 DIAGNOSIS — E1169 Type 2 diabetes mellitus with other specified complication: Secondary | ICD-10-CM

## 2014-10-20 DIAGNOSIS — E669 Obesity, unspecified: Secondary | ICD-10-CM

## 2014-10-20 DIAGNOSIS — I471 Supraventricular tachycardia: Secondary | ICD-10-CM | POA: Diagnosis not present

## 2014-10-20 LAB — COMPREHENSIVE METABOLIC PANEL
ALT: 11 U/L (ref 0–35)
AST: 18 U/L (ref 0–37)
Albumin: 4.2 g/dL (ref 3.5–5.2)
Alkaline Phosphatase: 66 U/L (ref 39–117)
BUN: 19 mg/dL (ref 6–23)
CO2: 29 mEq/L (ref 19–32)
Calcium: 10.1 mg/dL (ref 8.4–10.5)
Chloride: 105 mEq/L (ref 96–112)
Creatinine, Ser: 0.72 mg/dL (ref 0.40–1.20)
GFR: 83.08 mL/min (ref >60.00)
Glucose, Bld: 101 mg/dL — ABNORMAL HIGH (ref 70–99)
Potassium: 5 mEq/L (ref 3.5–5.1)
Sodium: 142 mEq/L (ref 135–145)
Total Bilirubin: 0.6 mg/dL (ref 0.2–1.2)
Total Protein: 7.9 g/dL (ref 6.0–8.3)

## 2014-10-20 LAB — CBC
HCT: 42.3 % (ref 36.0–46.0)
Hemoglobin: 13.8 g/dL (ref 12.0–15.0)
MCHC: 32.5 g/dL (ref 30.0–36.0)
MCV: 90.9 fl (ref 78.0–100.0)
Platelets: 354 10*3/uL (ref 150.0–400.0)
RBC: 4.66 Mil/uL (ref 3.87–5.11)
RDW: 14.9 % (ref 11.5–15.5)
WBC: 7.1 10*3/uL (ref 4.0–10.5)

## 2014-10-20 LAB — LIPID PANEL
Cholesterol: 224 mg/dL — ABNORMAL HIGH (ref 0–200)
HDL: 40.5 mg/dL (ref >39.00)
LDL Cholesterol: 151 mg/dL — ABNORMAL HIGH (ref 0–99)
NonHDL: 183.43
Total CHOL/HDL Ratio: 6
Triglycerides: 163 mg/dL — ABNORMAL HIGH (ref 0.0–149.0)
VLDL: 32.6 mg/dL (ref 0.0–40.0)

## 2014-10-20 LAB — MICROALBUMIN / CREATININE URINE RATIO
Creatinine,U: 114.4 mg/dL
Microalb Creat Ratio: 1.3 mg/g (ref 0.0–30.0)
Microalb, Ur: 1.5 mg/dL (ref 0.0–1.9)

## 2014-10-20 LAB — HEMOGLOBIN A1C: Hgb A1c MFr Bld: 6.1 % (ref 4.6–6.5)

## 2014-10-20 LAB — TSH: TSH: 4.05 u[IU]/mL (ref 0.35–4.50)

## 2014-10-20 NOTE — Patient Instructions (Signed)

## 2014-10-20 NOTE — Progress Notes (Signed)
Pre visit review using our clinic review tool, if applicable. No additional management support is needed unless otherwise documented below in the visit note. 

## 2014-10-20 NOTE — Progress Notes (Signed)
Patient ID: [name] Little, female   DOB: [birth year], 81 y.o.   MRN: [id number]    Subjective:    Patient ID: [name] Little, female    DOB: 12-16-1933, 81 y.o.   MRN: [id number]  Chief Complaint  Patient presents with  . Follow-up    HPI Patient is in today for follow-up. Overall feeling well. Has been having a lot of dental work done and has been seen the eye doctor for some trouble with her left retina but no significant pain or complaints today. No recent illness or acute concerns. Denies CP/palp/SOB/HA/congestion/fevers/GI or GU c/o. Taking meds as prescribed  Past Medical History  Diagnosis Date  . Personal history of colonic polyps     hyperplastic  . Hemorrhoids   . Thoracic aortic aneurysm   . Paroxysmal supraventricular tachycardia   . Factor V deficiency   . TMJ pain dysfunction syndrome   . DVT (deep venous thrombosis)   . Hyperlipemia   . Blood clotting disorder     V5 factor  . Clostridium difficile infection   . Hypertension   . Infiltrating ductal carcinoma of breast     stage one right   . Blood dyscrasia     factor 5 deficiency  . Arthritis     lumbar stenosis, radiculopathy, OA- L hip  . Clotting disorder   . Anxiety state, unspecified 09/22/2013  . Hyperglycemia 02/26/2014  . Nocturia 02/26/2014  . Low back pain 03/26/2014  . Diabetes mellitus type 2 in obese 02/26/2014  . Macular degeneration     Past Surgical History  Procedure Laterality Date  . Total hip arthroplasty  1999    right  . Laparoscopic hysterectomy    . Splenectomy    . Right calf surgery      for cancer  . Colonoscopy    . Upper gastrointestinal endoscopy    . Joint replacement      R hip replacement   . Breast lumpectomy  2010    right Dr. [name]  . Abdominal hysterectomy      1960- ? vaginal hysterectomy, not laparoscopic   . Eye surgery      bilateral cataracts- /w IOL   . Lumbar laminectomy/decompression microdiscectomy  04/11/2011    Procedure: LUMBAR  LAMINECTOMY/DECOMPRESSION MICRODISCECTOMY;  Surgeon: [name], MD;  Location: [place] [facility];  Service: Neurosurgery;  Laterality: N/A;  Lumbar Five-Sacral One Microdiscectomy    Family History  Problem Relation Age of Onset  . Heart disease Mother   . Heart disease Father   . Heart disease      uncles  . Clotting disorder Maternal Uncle   . Breast cancer Paternal Aunt   . Cancer Paternal Aunt     breast  . Colon cancer Neg Hx   . Anesthesia problems Neg Hx   . Hypotension Neg Hx   . Malignant hyperthermia Neg Hx   . Pseudochol deficiency Neg Hx     Social History   Social History  . Marital Status: Married    Spouse Name: N/A  . Number of Children: 4  . Years of Education: N/A   Occupational History  . Retired    Social History Main Topics  . Smoking status: Never Smoker   . Smokeless tobacco: Never Used  . Alcohol Use: No  . Drug Use: No  . Sexual Activity:    Partners: Male   Other Topics Concern  . Not on file   Social History Narrative   Married  [id number]   2 sons - '59, '61, 2 daughters '55, '57; 8 grandchildren; 1 great grand   I-ADLs          Outpatient Prescriptions Prior to Visit  Medication Sig Dispense Refill  . ALPRAZolam (XANAX) 0.25 MG tablet TAKE 1 TABLET BY MOUTH TWICE DAILY AS NEEDED FOR ANXIETY 10 tablet 1  . furosemide (LASIX) 20 MG tablet Take 1 tablet (20 mg total) by mouth daily as needed for fluid or edema. 30 tablet 1  . losartan (COZAAR) 100 MG tablet Take 1 tablet (100 mg total) by mouth daily. 30 tablet 6  . warfarin (COUMADIN) 5 MG tablet TAKE AS DIRECTED BY COUMADIN CLINIC. 90 tablet 0  . escitalopram (LEXAPRO) 10 MG tablet Take 1 tablet (10 mg total) by mouth daily. 30 tablet 6   No facility-administered medications prior to visit.    Allergies  Allergen Reactions  . Cefuroxime Axetil     REACTION: Ddoes not remember reaction  . Statins     Weakness, pain Lipitor and Pravachol  . Sulfonamide Derivatives     REACTION:  Rash    Review of Systems  Constitutional: Negative for fever and malaise/fatigue.  HENT: Negative for congestion.   Eyes: Negative for discharge.  Respiratory: Negative for shortness of breath.   Cardiovascular: Negative for chest pain, palpitations and leg swelling.  Gastrointestinal: Negative for nausea and abdominal pain.  Genitourinary: Negative for dysuria.  Musculoskeletal: Negative for falls.  Skin: Negative for rash.  Neurological: Negative for loss of consciousness and headaches.  Endo/Heme/Allergies: Negative for environmental allergies.  Psychiatric/Behavioral: Negative for depression. The patient is not nervous/anxious.        Objective:    Physical Exam  Pulse 65  Temp(Src) 98.5 F (36.9 C) (Oral)  Ht 5\' 3"  (1.6 m)  Wt 153 lb 6 oz (69.57 kg)  BMI 27.18 kg/m2  SpO2 98% Wt Readings from Last 3 Encounters:  10/20/14 153 lb 6 oz (69.57 kg)  08/17/14 154 lb 12.8 oz (70.217 kg)  07/13/14 154 lb (69.854 kg)     Lab Results  Component Value Date   WBC 5.9 07/13/2014   HGB 13.7 07/13/2014   HCT 42.3 07/13/2014   PLT 329.0 07/13/2014   GLUCOSE 105* 07/13/2014   CHOL 209* 07/13/2014   TRIG 95.0 07/13/2014   HDL 37.20* 07/13/2014   LDLDIRECT 158.8 07/27/2009   LDLCALC 153* 07/13/2014   ALT 11 07/13/2014   AST 16 07/13/2014   NA 138 07/13/2014   K 3.9 07/13/2014   CL 105 07/13/2014   CREATININE 0.79 07/13/2014   BUN 19 07/13/2014   CO2 28 07/13/2014   TSH 4.13 07/13/2014   INR 2.1 09/29/2014   HGBA1C 6.0 07/13/2014    Lab Results  Component Value Date   TSH 4.13 07/13/2014   Lab Results  Component Value Date   WBC 5.9 07/13/2014   HGB 13.7 07/13/2014   HCT 42.3 07/13/2014   MCV 90.4 07/13/2014   PLT 329.0 07/13/2014   Lab Results  Component Value Date   NA 138 07/13/2014   K 3.9 07/13/2014   CHLORIDE 109 08/12/2013   CO2 28 07/13/2014   GLUCOSE 105* 07/13/2014   BUN 19 07/13/2014   CREATININE 0.79 07/13/2014   BILITOT 0.7 07/13/2014     ALKPHOS 68 07/13/2014   AST 16 07/13/2014   ALT 11 07/13/2014   PROT 7.3 07/13/2014   ALBUMIN 4.0 07/13/2014   CALCIUM 9.4 07/13/2014   ANIONGAP 13 11/26/2013  GFR 74.70 07/13/2014   Lab Results  Component Value Date   CHOL 209* 07/13/2014   Lab Results  Component Value Date   HDL 37.20* 07/13/2014   Lab Results  Component Value Date   LDLCALC 153* 07/13/2014   Lab Results  Component Value Date   TRIG 95.0 07/13/2014   Lab Results  Component Value Date   CHOLHDL 6 07/13/2014   Lab Results  Component Value Date   HGBA1C 6.0 07/13/2014       Assessment & Plan:   Hyperlipidemia Encouraged heart healthy diet, increase exercise, avoid trans fats, consider a krill oil cap daily  Essential hypertension Improved on recheck but still elevated. Patient reports systolic bp in 505 to 697X over 60s at home.  no changes to meds. Encouraged heart healthy diet such as the DASH diet and exercise as tolerated.   Diabetes mellitus type 2 in obese hgba1c acceptable, minimize simple carbs. Increase exercise as tolerated. Continue current meds    I have discontinued Ms. Bilyeu's escitalopram. I am also having her maintain her furosemide, losartan, ALPRAZolam, and warfarin.  No orders of the defined types were placed in this encounter.     Elizabeth Sauer, LPN

## 2014-10-21 ENCOUNTER — Ambulatory Visit (INDEPENDENT_AMBULATORY_CARE_PROVIDER_SITE_OTHER): Payer: Medicare Other | Admitting: General Practice

## 2014-10-21 DIAGNOSIS — Z5181 Encounter for therapeutic drug level monitoring: Secondary | ICD-10-CM

## 2014-10-21 DIAGNOSIS — Z86718 Personal history of other venous thrombosis and embolism: Secondary | ICD-10-CM

## 2014-10-21 DIAGNOSIS — D682 Hereditary deficiency of other clotting factors: Secondary | ICD-10-CM | POA: Diagnosis not present

## 2014-10-21 LAB — POCT INR: INR: 2.1

## 2014-10-21 NOTE — Progress Notes (Signed)
I have reviewed and agree with the plan. 

## 2014-10-21 NOTE — Progress Notes (Signed)
Pre visit review using our clinic review tool, if applicable. No additional management support is needed unless otherwise documented below in the visit note. 

## 2014-10-22 ENCOUNTER — Telehealth: Payer: Self-pay | Admitting: Family Medicine

## 2014-10-22 NOTE — Progress Notes (Signed)
I have reviewed and agree with the plan. 

## 2014-10-22 NOTE — Telephone Encounter (Signed)
Lab results have been mailed.

## 2014-10-22 NOTE — Telephone Encounter (Signed)
Please mail copy of recent lab results to home address

## 2014-10-28 ENCOUNTER — Ambulatory Visit: Payer: Medicare Other

## 2014-10-28 NOTE — Assessment & Plan Note (Signed)
Improved on recheck but still elevated. Patient reports systolic bp in 103 to 128F over 60s at home.  no changes to meds. Encouraged heart healthy diet such as the DASH diet and exercise as tolerated.

## 2014-10-28 NOTE — Assessment & Plan Note (Signed)
Encouraged heart healthy diet, increase exercise, avoid trans fats, consider a krill oil cap daily 

## 2014-10-28 NOTE — Assessment & Plan Note (Signed)
hgba1c acceptable, minimize simple carbs. Increase exercise as tolerated. Continue current meds 

## 2014-11-06 ENCOUNTER — Telehealth: Payer: Self-pay | Admitting: Internal Medicine

## 2014-11-06 NOTE — Telephone Encounter (Signed)
Okay 

## 2014-11-11 NOTE — Telephone Encounter (Signed)
LM on VMail to CB to sch'd an appt

## 2014-11-18 ENCOUNTER — Ambulatory Visit (INDEPENDENT_AMBULATORY_CARE_PROVIDER_SITE_OTHER): Payer: Medicare Other | Admitting: General Practice

## 2014-11-18 DIAGNOSIS — Z5181 Encounter for therapeutic drug level monitoring: Secondary | ICD-10-CM | POA: Diagnosis not present

## 2014-11-18 DIAGNOSIS — Z86718 Personal history of other venous thrombosis and embolism: Secondary | ICD-10-CM

## 2014-11-18 DIAGNOSIS — D682 Hereditary deficiency of other clotting factors: Secondary | ICD-10-CM

## 2014-11-18 LAB — POCT INR: INR: 2.3

## 2014-11-18 NOTE — Progress Notes (Signed)
I have reviewed and agree with the plan. 

## 2014-11-18 NOTE — Progress Notes (Signed)
Pre visit review using our clinic review tool, if applicable. No additional management support is needed unless otherwise documented below in the visit note. 

## 2014-12-07 ENCOUNTER — Ambulatory Visit (INDEPENDENT_AMBULATORY_CARE_PROVIDER_SITE_OTHER): Payer: Medicare Other | Admitting: General Practice

## 2014-12-07 DIAGNOSIS — D682 Hereditary deficiency of other clotting factors: Secondary | ICD-10-CM

## 2014-12-07 DIAGNOSIS — Z5181 Encounter for therapeutic drug level monitoring: Secondary | ICD-10-CM | POA: Diagnosis not present

## 2014-12-07 DIAGNOSIS — Z86718 Personal history of other venous thrombosis and embolism: Secondary | ICD-10-CM

## 2014-12-07 LAB — POCT INR: INR: 2

## 2014-12-07 NOTE — Progress Notes (Signed)
Ok with this plan 

## 2014-12-07 NOTE — Progress Notes (Signed)
Pre visit review using our clinic review tool, if applicable. No additional management support is needed unless otherwise documented below in the visit note. 

## 2014-12-30 ENCOUNTER — Ambulatory Visit (INDEPENDENT_AMBULATORY_CARE_PROVIDER_SITE_OTHER): Payer: Medicare Other | Admitting: General Practice

## 2014-12-30 DIAGNOSIS — Z86718 Personal history of other venous thrombosis and embolism: Secondary | ICD-10-CM | POA: Diagnosis not present

## 2014-12-30 DIAGNOSIS — Z5181 Encounter for therapeutic drug level monitoring: Secondary | ICD-10-CM | POA: Diagnosis not present

## 2014-12-30 DIAGNOSIS — D682 Hereditary deficiency of other clotting factors: Secondary | ICD-10-CM | POA: Diagnosis not present

## 2014-12-30 LAB — POCT INR: INR: 2.7

## 2014-12-30 NOTE — Progress Notes (Signed)
Pre visit review using our clinic review tool, if applicable. No additional management support is needed unless otherwise documented below in the visit note. 

## 2014-12-30 NOTE — Progress Notes (Signed)
I have reviewed and agree with the plan. 

## 2015-01-18 ENCOUNTER — Ambulatory Visit: Payer: Medicare Other

## 2015-01-23 ENCOUNTER — Other Ambulatory Visit: Payer: Self-pay | Admitting: Family Medicine

## 2015-01-26 ENCOUNTER — Other Ambulatory Visit: Payer: Self-pay | Admitting: Family Medicine

## 2015-01-26 DIAGNOSIS — I10 Essential (primary) hypertension: Secondary | ICD-10-CM

## 2015-01-26 MED ORDER — LOSARTAN POTASSIUM 100 MG PO TABS: 100.0000 mg | ORAL_TABLET | Freq: Every day | ORAL | Status: DC

## 2015-01-26 NOTE — Telephone Encounter (Signed)
Requesting: alprazolam Contract  11/21/2013 UDS Low risk Last OV  10/20/2014 Last Refill  #10 with 1 refills on 08/04/2014  Please Advise

## 2015-01-26 NOTE — Telephone Encounter (Signed)
Faxed hardcopy for Alprazolam to [facility] in [place] 

## 2015-02-05 ENCOUNTER — Other Ambulatory Visit: Payer: Self-pay | Admitting: Family Medicine

## 2015-02-08 ENCOUNTER — Ambulatory Visit (INDEPENDENT_AMBULATORY_CARE_PROVIDER_SITE_OTHER): Payer: Medicare Other | Admitting: Family Medicine

## 2015-02-08 ENCOUNTER — Encounter: Payer: Self-pay | Admitting: Family Medicine

## 2015-02-08 VITALS — BP 162/82 | HR 73 | Temp 98.3°F | Ht 62.5 in | Wt 166.4 lb

## 2015-02-08 DIAGNOSIS — E119 Type 2 diabetes mellitus without complications: Secondary | ICD-10-CM

## 2015-02-08 DIAGNOSIS — E1169 Type 2 diabetes mellitus with other specified complication: Secondary | ICD-10-CM

## 2015-02-08 DIAGNOSIS — E785 Hyperlipidemia, unspecified: Secondary | ICD-10-CM

## 2015-02-08 DIAGNOSIS — E669 Obesity, unspecified: Secondary | ICD-10-CM | POA: Diagnosis not present

## 2015-02-08 DIAGNOSIS — I10 Essential (primary) hypertension: Secondary | ICD-10-CM | POA: Diagnosis not present

## 2015-02-08 LAB — CBC
HCT: 43 % (ref 36.0–46.0)
Hemoglobin: 14.1 g/dL (ref 12.0–15.0)
MCHC: 32.7 g/dL (ref 30.0–36.0)
MCV: 90.8 fl (ref 78.0–100.0)
Platelets: 357 10*3/uL (ref 150.0–400.0)
RBC: 4.74 Mil/uL (ref 3.87–5.11)
RDW: 14.5 % (ref 11.5–15.5)
WBC: 8.3 10*3/uL (ref 4.0–10.5)

## 2015-02-08 LAB — COMPREHENSIVE METABOLIC PANEL
ALT: 15 U/L (ref 0–35)
AST: 20 U/L (ref 0–37)
Albumin: 4.3 g/dL (ref 3.5–5.2)
Alkaline Phosphatase: 69 U/L (ref 39–117)
BUN: 19 mg/dL (ref 6–23)
CO2: 30 mEq/L (ref 19–32)
Calcium: 9.8 mg/dL (ref 8.4–10.5)
Chloride: 105 mEq/L (ref 96–112)
Creatinine, Ser: 0.7 mg/dL (ref 0.40–1.20)
GFR: 85.76 mL/min (ref >60.00)
Glucose, Bld: 121 mg/dL — ABNORMAL HIGH (ref 70–99)
Potassium: 4.4 mEq/L (ref 3.5–5.1)
Sodium: 141 mEq/L (ref 135–145)
Total Bilirubin: 0.6 mg/dL (ref 0.2–1.2)
Total Protein: 7.3 g/dL (ref 6.0–8.3)

## 2015-02-08 LAB — LIPID PANEL
Cholesterol: 228 mg/dL — ABNORMAL HIGH (ref 0–200)
HDL: 39 mg/dL — ABNORMAL LOW (ref >39.00)
LDL Cholesterol: 159 mg/dL — ABNORMAL HIGH (ref 0–99)
NonHDL: 189.35
Total CHOL/HDL Ratio: 6
Triglycerides: 151 mg/dL — ABNORMAL HIGH (ref 0.0–149.0)
VLDL: 30.2 mg/dL (ref 0.0–40.0)

## 2015-02-08 LAB — TSH: TSH: 3.36 u[IU]/mL (ref 0.35–4.50)

## 2015-02-08 LAB — HEMOGLOBIN A1C: Hgb A1c MFr Bld: 6.2 % (ref 4.6–6.5)

## 2015-02-08 MED ORDER — METOPROLOL SUCCINATE ER 25 MG PO TB24
25.0000 mg | ORAL_TABLET | Freq: Every day | ORAL | Status: DC
Start: 2015-02-08 — End: 2015-02-16

## 2015-02-08 NOTE — Patient Instructions (Signed)
Hypertension Hypertension, commonly called high blood pressure, is when the force of blood pumping through your arteries is too strong. Your arteries are the blood vessels that carry blood from your heart throughout your body. A blood pressure reading consists of a higher number over a lower number, such as 110/72. The higher number (systolic) is the pressure inside your arteries when your heart pumps. The lower number (diastolic) is the pressure inside your arteries when your heart relaxes. Ideally you want your blood pressure below 120/80. Hypertension forces your heart to work harder to pump blood. Your arteries may become narrow or stiff. Having untreated or uncontrolled hypertension can cause heart attack, stroke, kidney disease, and other problems. RISK FACTORS Some risk factors for high blood pressure are controllable. Others are not.  Risk factors you cannot control include:   Race. You may be at higher risk if you are African American.  Age. Risk increases with age.  Gender. Men are at higher risk than women before age 45 years. After age 65, women are at higher risk than men. Risk factors you can control include:  Not getting enough exercise or physical activity.  Being overweight.  Getting too much fat, sugar, calories, or salt in your diet.  Drinking too much alcohol. SIGNS AND SYMPTOMS Hypertension does not usually cause signs or symptoms. Extremely high blood pressure (hypertensive crisis) may cause headache, anxiety, shortness of breath, and nosebleed. DIAGNOSIS To check if you have hypertension, your health care provider will measure your blood pressure while you are seated, with your arm held at the level of your heart. It should be measured at least twice using the same arm. Certain conditions can cause a difference in blood pressure between your right and left arms. A blood pressure reading that is higher than normal on one occasion does not mean that you need treatment. If  it is not clear whether you have high blood pressure, you may be asked to return on a different day to have your blood pressure checked again. Or, you may be asked to monitor your blood pressure at home for 1 or more weeks. TREATMENT Treating high blood pressure includes making lifestyle changes and possibly taking medicine. Living a healthy lifestyle can help lower high blood pressure. You may need to change some of your habits. Lifestyle changes may include:  Following the DASH diet. This diet is high in fruits, vegetables, and whole grains. It is low in salt, red meat, and added sugars.  Keep your sodium intake below 2,300 mg per day.  Getting at least 30-45 minutes of aerobic exercise at least 4 times per week.  Losing weight if necessary.  Not smoking.  Limiting alcoholic beverages.  Learning ways to reduce stress. Your health care provider may prescribe medicine if lifestyle changes are not enough to get your blood pressure under control, and if one of the following is true:  You are 18-59 years of age and your systolic blood pressure is above 140.  You are 60 years of age or older, and your systolic blood pressure is above 150.  Your diastolic blood pressure is above 90.  You have diabetes, and your systolic blood pressure is over 140 or your diastolic blood pressure is over 90.  You have kidney disease and your blood pressure is above 140/90.  You have heart disease and your blood pressure is above 140/90. Your personal target blood pressure may vary depending on your medical conditions, your age, and other factors. HOME CARE INSTRUCTIONS    Have your blood pressure rechecked as directed by your health care provider.   Take medicines only as directed by your health care provider. Follow the directions carefully. Blood pressure medicines must be taken as prescribed. The medicine does not work as well when you skip doses. Skipping doses also puts you at risk for  problems.  Do not smoke.   Monitor your blood pressure at home as directed by your health care provider. SEEK MEDICAL CARE IF:   You think you are having a reaction to medicines taken.  You have recurrent headaches or feel dizzy.  You have swelling in your ankles.  You have trouble with your vision. SEEK IMMEDIATE MEDICAL CARE IF:  You develop a severe headache or confusion.  You have unusual weakness, numbness, or feel faint.  You have severe chest or abdominal pain.  You vomit repeatedly.  You have trouble breathing. MAKE SURE YOU:   Understand these instructions.  Will watch your condition.  Will get help right away if you are not doing well or get worse.   This information is not intended to replace advice given to you by your health care provider. Make sure you discuss any questions you have with your health care provider.   Document Released: 01/16/2005 Document Revised: 06/02/2014 Document Reviewed: 11/08/2012 Elsevier Interactive Patient Education 2016 [employer]  

## 2015-02-08 NOTE — Progress Notes (Signed)
Pre visit review using our clinic review tool, if applicable. No additional management support is needed unless otherwise documented below in the visit note. 

## 2015-02-10 ENCOUNTER — Ambulatory Visit (INDEPENDENT_AMBULATORY_CARE_PROVIDER_SITE_OTHER): Payer: Medicare Other | Admitting: General Practice

## 2015-02-10 DIAGNOSIS — Z5181 Encounter for therapeutic drug level monitoring: Secondary | ICD-10-CM | POA: Diagnosis not present

## 2015-02-10 DIAGNOSIS — D682 Hereditary deficiency of other clotting factors: Secondary | ICD-10-CM | POA: Diagnosis not present

## 2015-02-10 DIAGNOSIS — Z86718 Personal history of other venous thrombosis and embolism: Secondary | ICD-10-CM

## 2015-02-10 LAB — POCT INR: INR: 1.9

## 2015-02-10 NOTE — Progress Notes (Signed)
Pre visit review using our clinic review tool, if applicable. No additional management support is needed unless otherwise documented below in the visit note. Patient's INR is low today.  Patient boosted and instructed to continue current regimen.  RN stressed the importance of not skipping medication and taking medication daily at approximately the same time.  Also stressed the importance of drastic changes in diet.  Patient verbalized understanding.

## 2015-02-10 NOTE — Progress Notes (Signed)
I have reviewed and agree with the plan. 

## 2015-02-14 ENCOUNTER — Encounter: Payer: Self-pay | Admitting: Family Medicine

## 2015-02-14 NOTE — Assessment & Plan Note (Addendum)
Poorly controlled will alter medications, encouraged DASH diet, minimize caffeine and obtain adequate sleep. Report concerning symptoms and follow up as directed and as needed. Started on Metoprolol XR 25 mg

## 2015-02-14 NOTE — Assessment & Plan Note (Signed)
Encouraged heart healthy diet, increase exercise, avoid trans fats, consider a krill oil cap daily 

## 2015-02-14 NOTE — Progress Notes (Signed)
Patient ID: [name] Little, female   DOB: 1933/02/09, 82 y.o.   MRN: [id number]   Subjective:    Patient ID: [name] Little, female    DOB: Mar 07, 1933, 82 y.o.   MRN: [id number]  Chief Complaint  Patient presents with  . Follow-up    HPI Patient is in today for follow-up. She's been under a great deal of stress with her husband's illness. He has had several strokes. She otherwise feels well. She denies headache or any concerning symptoms secondary to high blood pressure. No recent illness or fevers. Denies CP/palp/SOB/HA/congestion/fevers/GI or GU c/o. Taking meds as prescribed  Past Medical History  Diagnosis Date  . Personal history of colonic polyps     hyperplastic  . Hemorrhoids   . Thoracic aortic aneurysm ([place])   . Paroxysmal supraventricular tachycardia ([place])   . Factor V deficiency ([place])   . TMJ pain dysfunction syndrome   . DVT (deep venous thrombosis) ([place])   . Hyperlipemia   . Blood clotting disorder ([facility])     V5 factor  . Clostridium difficile infection   . Hypertension   . Infiltrating ductal carcinoma of breast ([place])     stage one right   . Blood dyscrasia     factor 5 deficiency  . Arthritis     lumbar stenosis, radiculopathy, OA- L hip  . Clotting disorder ([place])   . Anxiety state, unspecified 09/22/2013  . Hyperglycemia 02/26/2014  . Nocturia 02/26/2014  . Low back pain 03/26/2014  . Diabetes mellitus type 2 in obese ([place]) 02/26/2014  . Macular degeneration     Past Surgical History  Procedure Laterality Date  . Total hip arthroplasty  1999    right  . Laparoscopic hysterectomy    . Splenectomy    . Right calf surgery      for cancer  . Colonoscopy    . Upper gastrointestinal endoscopy    . Joint replacement      R hip replacement   . Breast lumpectomy  2010    right Dr. [name]  . Abdominal hysterectomy      1960- ? vaginal hysterectomy, not laparoscopic   . Eye surgery      bilateral cataracts- /w IOL   . Lumbar laminectomy/decompression  microdiscectomy  04/11/2011    Procedure: LUMBAR LAMINECTOMY/DECOMPRESSION MICRODISCECTOMY;  Surgeon: [name], MD;  Location: [facility];  Service: Neurosurgery;  Laterality: N/A;  Lumbar Five-Sacral One Microdiscectomy    Family History  Problem Relation Age of Onset  . Heart disease Mother   . Heart disease Father   . Heart disease      uncles  . Clotting disorder Maternal Uncle   . Breast cancer Paternal Aunt   . Cancer Paternal Aunt     breast  . Colon cancer Neg Hx   . Anesthesia problems Neg Hx   . Hypotension Neg Hx   . Malignant hyperthermia Neg Hx   . Pseudochol deficiency Neg Hx     Social History   Social History  . Marital Status: Married    Spouse Name: N/A  . Number of Children: 4  . Years of Education: N/A   Occupational History  . Retired    Social History Main Topics  . Smoking status: Never Smoker   . Smokeless tobacco: Never Used  . Alcohol Use: No  . Drug Use: No  . Sexual Activity:    Partners: Male   Other Topics Concern  . Not on file  Social History Narrative   Married 1955   2 sons - '59, '61, 2 daughters '55, '57; 8 grandchildren; 1 great grand   I-ADLs          Outpatient Prescriptions Prior to Visit  Medication Sig Dispense Refill  . ALPRAZolam (XANAX) 0.25 MG tablet TAKE 1 TABLET BY MOUH TWICE DAILY AS NEEDED FOR ANXIETY 10 tablet 1  . B Complex Vitamins (VITAMIN B COMPLEX PO) Take by mouth.    . furosemide (LASIX) 20 MG tablet Take 1 tablet (20 mg total) by mouth daily as needed for fluid or edema. 30 tablet 1  . losartan (COZAAR) 100 MG tablet Take 1 tablet (100 mg total) by mouth daily. 90 tablet 1  . warfarin (COUMADIN) 5 MG tablet TAKE AS DIRECTED BY COUMADIN CLINIC. 90 tablet 3  . losartan (COZAAR) 100 MG tablet Take 1 tablet (100 mg total) by mouth daily. 30 tablet 6  . warfarin (COUMADIN) 5 MG tablet TAKE AS DIRECTED BY COUMADIN CLINIC. 90 tablet 0   No facility-administered medications prior to visit.     Allergies  Allergen Reactions  . Cefuroxime Axetil     REACTION: Ddoes not remember reaction  . Statins     Weakness, pain Lipitor and Pravachol  . Sulfonamide Derivatives     REACTION: Rash    Review of Systems  Constitutional: Negative for fever and malaise/fatigue.  HENT: Negative for congestion.   Eyes: Negative for discharge.  Respiratory: Negative for shortness of breath.   Cardiovascular: Negative for chest pain, palpitations and leg swelling.  Gastrointestinal: Negative for nausea and abdominal pain.  Genitourinary: Negative for dysuria.  Musculoskeletal: Negative for falls.  Skin: Negative for rash.  Neurological: Negative for loss of consciousness and headaches.  Endo/Heme/Allergies: Negative for environmental allergies.  Psychiatric/Behavioral: Negative for depression. The patient is not nervous/anxious.        Objective:    Physical Exam  Constitutional: She is oriented to person, place, and time. She appears well-developed and well-nourished. No distress.  HENT:  Head: Normocephalic and atraumatic.  Nose: Nose normal.  Eyes: Right eye exhibits no discharge. Left eye exhibits no discharge.  Neck: Normal range of motion. Neck supple.  Cardiovascular: Normal rate and regular rhythm.   No murmur heard. Pulmonary/Chest: Effort normal and breath sounds normal.  Abdominal: Soft. Bowel sounds are normal. There is no tenderness.  Musculoskeletal: She exhibits no edema.  Neurological: She is alert and oriented to person, place, and time.  Skin: Skin is warm and dry.  Psychiatric: She has a normal mood and affect.  Nursing note and vitals reviewed.   BP 162/82 mmHg  Pulse 73  Temp(Src) 98.3 F (36.8 C) (Oral)  Ht 5' 2.5" (1.588 m)  Wt 166 lb 6 oz (75.467 kg)  BMI 29.93 kg/m2  SpO2 97% Wt Readings from Last 3 Encounters:  02/08/15 166 lb 6 oz (75.467 kg)  10/20/14 153 lb 6 oz (69.57 kg)  08/17/14 154 lb 12.8 oz (70.217 kg)     Lab Results   Component Value Date   WBC 8.3 02/08/2015   HGB 14.1 02/08/2015   HCT 43.0 02/08/2015   PLT 357.0 02/08/2015   GLUCOSE 121* 02/08/2015   CHOL 228* 02/08/2015   TRIG 151.0* 02/08/2015   HDL 39.00* 02/08/2015   LDLDIRECT 158.8 07/27/2009   LDLCALC 159* 02/08/2015   ALT 15 02/08/2015   AST 20 02/08/2015   NA 141 02/08/2015   K 4.4 02/08/2015   CL 105 02/08/2015  CREATININE 0.70 02/08/2015   BUN 19 02/08/2015   CO2 30 02/08/2015   TSH 3.36 02/08/2015   INR 1.9 02/10/2015   HGBA1C 6.2 02/08/2015   MICROALBUR 1.5 10/20/2014    Lab Results  Component Value Date   TSH 3.36 02/08/2015   Lab Results  Component Value Date   WBC 8.3 02/08/2015   HGB 14.1 02/08/2015   HCT 43.0 02/08/2015   MCV 90.8 02/08/2015   PLT 357.0 02/08/2015   Lab Results  Component Value Date   NA 141 02/08/2015   K 4.4 02/08/2015   CHLORIDE 109 08/12/2013   CO2 30 02/08/2015   GLUCOSE 121* 02/08/2015   BUN 19 02/08/2015   CREATININE 0.70 02/08/2015   BILITOT 0.6 02/08/2015   ALKPHOS 69 02/08/2015   AST 20 02/08/2015   ALT 15 02/08/2015   PROT 7.3 02/08/2015   ALBUMIN 4.3 02/08/2015   CALCIUM 9.8 02/08/2015   ANIONGAP 13 11/26/2013   GFR 85.76 02/08/2015   Lab Results  Component Value Date   CHOL 228* 02/08/2015   Lab Results  Component Value Date   HDL 39.00* 02/08/2015   Lab Results  Component Value Date   LDLCALC 159* 02/08/2015   Lab Results  Component Value Date   TRIG 151.0* 02/08/2015   Lab Results  Component Value Date   CHOLHDL 6 02/08/2015   Lab Results  Component Value Date   HGBA1C 6.2 02/08/2015       Assessment & Plan:   Problem List Items Addressed This Visit    Diabetes mellitus type 2 in obese ([place])    hgba1c acceptable, minimize simple carbs. Increase exercise as tolerated. Continue current meds      Relevant Orders   Hemoglobin A1c (Completed)   Lipid panel (Completed)   Comprehensive metabolic panel (Completed)   CBC (Completed)   TSH  (Completed)   Essential hypertension    Poorly controlled will alter medications, encouraged DASH diet, minimize caffeine and obtain adequate sleep. Report concerning symptoms and follow up as directed and as needed. Started on Metoprolol XR 25 mg      Relevant Medications   metoprolol succinate (TOPROL-XL) 25 MG 24 hr tablet   Other Relevant Orders   Hemoglobin A1c (Completed)   Lipid panel (Completed)   Comprehensive metabolic panel (Completed)   CBC (Completed)   TSH (Completed)   Hyperlipidemia    Encouraged heart healthy diet, increase exercise, avoid trans fats, consider a krill oil cap daily      Relevant Medications   metoprolol succinate (TOPROL-XL) 25 MG 24 hr tablet   Other Relevant Orders   Hemoglobin A1c (Completed)   Lipid panel (Completed)   Comprehensive metabolic panel (Completed)   CBC (Completed)   TSH (Completed)   Obesity (BMI 30-39.9) - Primary    Encouraged DASH diet, decrease po intake and increase exercise as tolerated. Needs 7-8 hours of sleep nightly. Avoid trans fats, eat small, frequent meals every 4-5 hours with lean proteins, complex carbs and healthy fats. Minimize simple carbs      Relevant Orders   Hemoglobin A1c (Completed)   Lipid panel (Completed)   Comprehensive metabolic panel (Completed)   CBC (Completed)   TSH (Completed)      I am having Ms. Doolin start on metoprolol succinate. I am also having her maintain her furosemide, B Complex Vitamins (VITAMIN B COMPLEX PO), ALPRAZolam, losartan, and warfarin.  Meds ordered this encounter  Medications  . metoprolol succinate (TOPROL-XL) 25 MG 24 hr tablet  Sig: Take 1 tablet (25 mg total) by mouth daily.    Dispense:  30 tablet    Refill:  3     Penni Homans, MD

## 2015-02-14 NOTE — Assessment & Plan Note (Signed)
Encouraged DASH diet, decrease po intake and increase exercise as tolerated. Needs 7-8 hours of sleep nightly. Avoid trans fats, eat small, frequent meals every 4-5 hours with lean proteins, complex carbs and healthy fats. Minimize simple carbs 

## 2015-02-14 NOTE — Assessment & Plan Note (Signed)
hgba1c acceptable, minimize simple carbs. Increase exercise as tolerated. Continue current meds 

## 2015-02-15 ENCOUNTER — Telehealth: Payer: Self-pay | Admitting: Family Medicine

## 2015-02-15 NOTE — Telephone Encounter (Signed)
Caller name: Relation to [id number] Call back number: Pharmacy:  Reason for call:  Patient states metoprolol succinate (TOPROL-XL) 25 MG 24 hr tablet is causing swollen feet and head ache.

## 2015-02-15 NOTE — Telephone Encounter (Signed)
Relation to [id number] Call back number:[phone]   Reason for call:  Patient states metoprolol succinate (TOPROL-XL) 25 MG 24 hr tablet is causing swollen feet and head aches in need of clinical advice

## 2015-02-15 NOTE — Telephone Encounter (Signed)
D/c Metoprolol and try Carvedilol 3.25 mg po bid disp #60 with 2 rf

## 2015-02-16 MED ORDER — CARVEDILOL 3.125 MG PO TABS: 3.1250 mg | ORAL_TABLET | Freq: Two times a day (BID) | ORAL | Status: DC

## 2015-02-16 NOTE — Telephone Encounter (Signed)
clarifiy please I could not find 3.25 there was a 3.125?? Is that what you wanted, let me know thanks

## 2015-02-16 NOTE — Telephone Encounter (Signed)
Correct should be Carvedilol 3.125 mg po bid

## 2015-02-16 NOTE — Telephone Encounter (Signed)
Sent in new medication and updated her medication list. Called the patient informed of PCP instructions regarding medication.

## 2015-03-01 ENCOUNTER — Telehealth: Payer: Self-pay | Admitting: Family Medicine

## 2015-03-01 NOTE — Telephone Encounter (Signed)
Caller name:Keiara Relationship to patient:self Can be [phone]\ Pharmacy:[facility]  Reason for call she was put on carvidilol 3.125 mg tablets  She is to take two.  She has a bad headache and feet ankles legs are swelling.  She feels awful.  Thinks she needs a different bp med.  The pharmacist told her yesterday not to take any more meds

## 2015-03-01 NOTE — Telephone Encounter (Signed)
Patient informed of PCP instructions.  She cannot remember why she stopped metoprolol.  She has already nurse visit appt. In the morning. Patient verbalized understanding of instructions.

## 2015-03-01 NOTE — Telephone Encounter (Signed)
Fine for her to hold Carvedilol and come in at end of week for BP check so we can change meds. I looked through her chart and noted that for a short time in 2012 she took Metoprolol, see if she can remember why she stopped it.

## 2015-03-02 ENCOUNTER — Ambulatory Visit (INDEPENDENT_AMBULATORY_CARE_PROVIDER_SITE_OTHER): Payer: Medicare Other | Admitting: Family Medicine

## 2015-03-02 VITALS — BP 140/88 | HR 88

## 2015-03-02 DIAGNOSIS — I10 Essential (primary) hypertension: Secondary | ICD-10-CM

## 2015-03-02 LAB — COMPREHENSIVE METABOLIC PANEL
ALT: 13 U/L (ref 0–35)
AST: 17 U/L (ref 0–37)
Albumin: 3.9 g/dL (ref 3.5–5.2)
Alkaline Phosphatase: 62 U/L (ref 39–117)
BUN: 21 mg/dL (ref 6–23)
CO2: 29 mEq/L (ref 19–32)
Calcium: 9.4 mg/dL (ref 8.4–10.5)
Chloride: 105 mEq/L (ref 96–112)
Creatinine, Ser: 0.77 mg/dL (ref 0.40–1.20)
GFR: 76.82 mL/min (ref >60.00)
Glucose, Bld: 104 mg/dL — ABNORMAL HIGH (ref 70–99)
Potassium: 3.7 mEq/L (ref 3.5–5.1)
Sodium: 142 mEq/L (ref 135–145)
Total Bilirubin: 0.6 mg/dL (ref 0.2–1.2)
Total Protein: 7.5 g/dL (ref 6.0–8.3)

## 2015-03-02 NOTE — Progress Notes (Signed)
Pre visit review using our clinic review tool, if applicable. No additional management support is needed unless otherwise documented below in the visit note.   Patient in for Blood Pressure Check. BP= 140/88 Pulse 88  Patient to return on 05/18/15 for follow up with Dr. Charlett Blake unless BP starts to run over 145/90 per Dr. Charlett Blake patient is to return for office visit sooner   RN blood check note reviewed. Agree with documention and plan.

## 2015-03-02 NOTE — Progress Notes (Signed)
RN blood check note reviewed. Agree with documention and plan. 

## 2015-03-02 NOTE — Patient Instructions (Signed)
Patient to return on 05/18/15 for follow up with Dr. Charlett Blake unless BP starts to run over 145/90 per Dr. Charlett Blake patient is to return for office visit sooner   Record BP daily.

## 2015-03-09 ENCOUNTER — Telehealth: Payer: Self-pay | Admitting: Family Medicine

## 2015-03-09 NOTE — Telephone Encounter (Signed)
Patient informed of lab result from 03/02/2015. Informed a letter had been mailed as well.

## 2015-03-09 NOTE — Telephone Encounter (Signed)
Pt would like her lab results.   CB: [phone]

## 2015-03-24 ENCOUNTER — Ambulatory Visit (INDEPENDENT_AMBULATORY_CARE_PROVIDER_SITE_OTHER): Payer: Medicare Other | Admitting: General Practice

## 2015-03-24 DIAGNOSIS — D682 Hereditary deficiency of other clotting factors: Secondary | ICD-10-CM | POA: Diagnosis not present

## 2015-03-24 DIAGNOSIS — Z5181 Encounter for therapeutic drug level monitoring: Secondary | ICD-10-CM

## 2015-03-24 DIAGNOSIS — Z86718 Personal history of other venous thrombosis and embolism: Secondary | ICD-10-CM

## 2015-03-24 LAB — POCT INR: INR: 2.3

## 2015-03-24 NOTE — Progress Notes (Signed)
I have reviewed and agree with the plan. 

## 2015-03-24 NOTE — Progress Notes (Signed)
Pre visit review using our clinic review tool, if applicable. No additional management support is needed unless otherwise documented below in the visit note. 

## 2015-04-09 ENCOUNTER — Telehealth: Payer: Self-pay | Admitting: Family Medicine

## 2015-04-09 NOTE — Telephone Encounter (Signed)
Please call pharmacy and make sure they take it out of her list so they do not give it to her again

## 2015-04-09 NOTE — Telephone Encounter (Signed)
Spoke to the patient and symptoms, started last night , ankles/feet swelling, heart racing, terrible night mares and jittery.  Patient has had 3 doses of the medication. Patient thinks this may be the second beta blocker she has had problems with. Can call back on either cell/home number, ok to leave a message.

## 2015-04-09 NOTE — Telephone Encounter (Signed)
Relation to [id number] Call back [phone] Pharmacy:  Reason for call:  Patient states carvedilol (COREG) 3.125 MG tablet [id number] is causing patient to have jitters and nightmares patient would like to speak with nurse immediately.

## 2015-04-09 NOTE — Telephone Encounter (Signed)
Telephone note dated 03/01/2015 refers to the problems this patient was having with this medication.  Nurse visit dated on 03/02/2015 this medication was d/c. Patient stated the pharmacy called her this week that it had been refilled and she picked it up and started taking. I informed her to not take due to symptoms and would have PCP review messages.

## 2015-04-12 MED ORDER — AMLODIPINE BESYLATE 2.5 MG PO TABS: 2.5000 mg | ORAL_TABLET | Freq: Every day | ORAL | Status: DC

## 2015-04-12 NOTE — Telephone Encounter (Signed)
Lets start her on Amlodipine 2.5 mg daily disp #30 with 3 rf. Not in same class at Carvedilol and return for BP check in about 2 weeks

## 2015-04-12 NOTE — Telephone Encounter (Signed)
Patient informed and sent in new medication. Scheduled nurse visit appointment

## 2015-04-12 NOTE — Telephone Encounter (Signed)
Called the pharmacy did inform to remove from her list and not to refill.  I spoke to the patient this morning and she states her BP was 163/88.  Needs advise on what to do.

## 2015-04-19 ENCOUNTER — Telehealth: Payer: Self-pay | Admitting: Family Medicine

## 2015-04-19 NOTE — Telephone Encounter (Signed)
Stop Amlodipine and come in for bp check

## 2015-04-19 NOTE — Telephone Encounter (Signed)
Patient informed and ok to do. Nurse visit scheduled already.

## 2015-04-19 NOTE — Telephone Encounter (Signed)
Caller name: Self  Can be reached:[phone] or [phone]  Reason for call: Patient called to inform PCP that since she begin taking Amlodipine on 3/13 she is retaining fluid in her entire body and her feet and ankles are swollen. She is also going from being hot to being cold. Plse adv

## 2015-04-27 ENCOUNTER — Ambulatory Visit (INDEPENDENT_AMBULATORY_CARE_PROVIDER_SITE_OTHER): Payer: Medicare Other | Admitting: Family Medicine

## 2015-04-27 VITALS — BP 154/74 | HR 69

## 2015-04-27 DIAGNOSIS — I10 Essential (primary) hypertension: Secondary | ICD-10-CM

## 2015-04-27 MED ORDER — CHLORTHALIDONE 25 MG PO TABS: 25.0000 mg | ORAL_TABLET | Freq: Every day | ORAL | Status: DC

## 2015-04-27 NOTE — Progress Notes (Signed)
Pre visit review using our clinic review tool, if applicable. No additional management support is needed unless otherwise documented below in the visit note.  Per 04/09/15 phone note: return for BP check in about 2 weeks  Per Dr. Charlett Blake: Start chlorthalidone 25 mg daily. Return for nurse visit BP check and BMP in 3-4 weeks. Return for OV in 3-4 months.   Pt verbalized understanding of instructions. Pt given handout for potassium rich foods. Encouraged pt to call the office if any questions or concerns regarding medication.  Pt has appt w/ Dr. Charlett Blake 05/18/15.  Dorrene German, RN  RN blood check note reviewed. Agree with documention and plan.

## 2015-04-28 ENCOUNTER — Telehealth: Payer: Self-pay | Admitting: Family Medicine

## 2015-04-28 NOTE — Telephone Encounter (Signed)
Caller name: Self  Can be reached: [phone]  Reason for call: Has question about the foods that she needs to eat for her potassium.

## 2015-04-28 NOTE — Telephone Encounter (Signed)
Called and left a message for call back  

## 2015-04-29 NOTE — Telephone Encounter (Signed)
[phone] (cell) The patient was put on chlorthalidone this week.  The patient read the information on this medication and it stated that it may effect her potassium.  She cannot eat a diet of potassium rich foods as they have sugar in them.  But the pharmacist informed her adding a potassium pill may be an option.  advise

## 2015-04-29 NOTE — Telephone Encounter (Signed)
Patient informed of PCP instructions.   She is scheduled to see PCP on 05/18/2015.  She wondered if she needs a BP check/lab before that appt. Or if all can wait until then.

## 2015-04-29 NOTE — Telephone Encounter (Signed)
Pt returned your call.  CB: [phone]

## 2015-04-29 NOTE — Telephone Encounter (Signed)
If she is feeling OK she is OK til visit, if she is having any concerns, CP/palp/muscle cramps or concerns she can come in sooner

## 2015-04-29 NOTE — Telephone Encounter (Signed)
So it may affect potassium but it may not. That is why we have scheduled a BP recheck and CMP soon, if she is having any muscle cramps or palpitations we can check cmp  Now. Or if she is worried we can

## 2015-04-30 NOTE — Telephone Encounter (Signed)
Patient returned phone call.  I informed her of PCP instructions.  She stated she is feeling very good, woke this am took her meds. And checked BP and was 129/73.  She will certainly call if has any symptoms listed.

## 2015-04-30 NOTE — Telephone Encounter (Signed)
Called left message to call back 

## 2015-05-05 ENCOUNTER — Ambulatory Visit: Payer: Medicare Other

## 2015-05-07 ENCOUNTER — Ambulatory Visit (INDEPENDENT_AMBULATORY_CARE_PROVIDER_SITE_OTHER): Payer: Medicare Other | Admitting: General Practice

## 2015-05-07 DIAGNOSIS — Z5181 Encounter for therapeutic drug level monitoring: Secondary | ICD-10-CM

## 2015-05-07 DIAGNOSIS — Z86718 Personal history of other venous thrombosis and embolism: Secondary | ICD-10-CM

## 2015-05-07 DIAGNOSIS — D682 Hereditary deficiency of other clotting factors: Secondary | ICD-10-CM

## 2015-05-07 LAB — POCT INR: INR: 2.6

## 2015-05-07 NOTE — Progress Notes (Signed)
Pre visit review using our clinic review tool, if applicable. No additional management support is needed unless otherwise documented below in the visit note. 

## 2015-05-07 NOTE — Progress Notes (Signed)
I have reviewed and agree with the plan. 

## 2015-05-10 ENCOUNTER — Telehealth: Payer: Self-pay | Admitting: Family Medicine

## 2015-05-10 NOTE — Telephone Encounter (Signed)
Called to follow up with patient.  She says she's feeling a lot of better now.  She's actually out walking.  No longer experiencing weakness.  Denies any other symptoms.  States she went to the drug store and they told her that her symptoms were probably due to her potassium.  BP was fine.  She says she took potassium pill and started to feel better.  Pt plans to keep follow up appt as scheduled on 05/18/15.  Pt was advised if symptoms return or new symptoms develop to call back for further instruction.  Pt may need to see a provider sooner.  Pt stated understanding and agreed with plan.

## 2015-05-10 NOTE — Telephone Encounter (Signed)
Caller name:Cadince Relationship to patient: Can be reached:[phone] Pharmacy:[facility]   Reason for call:oN A MED FOR BP AND IT IS NOT WORKING  SHE IS FEELING WEAK.

## 2015-05-10 NOTE — Telephone Encounter (Signed)
This was just now sent to me -- Please call to assess patient. If no alarm symptoms, she needs to be scheduled for OV. If any alarm symptoms, she needs to go to ER.

## 2015-05-18 ENCOUNTER — Ambulatory Visit (INDEPENDENT_AMBULATORY_CARE_PROVIDER_SITE_OTHER): Payer: Medicare Other | Admitting: Family Medicine

## 2015-05-18 ENCOUNTER — Encounter: Payer: Self-pay | Admitting: Family Medicine

## 2015-05-18 VITALS — BP 120/80 | HR 72 | Temp 98.4°F | Ht 63.0 in | Wt 168.5 lb

## 2015-05-18 DIAGNOSIS — E669 Obesity, unspecified: Secondary | ICD-10-CM | POA: Diagnosis not present

## 2015-05-18 DIAGNOSIS — E785 Hyperlipidemia, unspecified: Secondary | ICD-10-CM

## 2015-05-18 DIAGNOSIS — M79676 Pain in unspecified toe(s): Secondary | ICD-10-CM | POA: Diagnosis not present

## 2015-05-18 DIAGNOSIS — E119 Type 2 diabetes mellitus without complications: Secondary | ICD-10-CM

## 2015-05-18 DIAGNOSIS — I10 Essential (primary) hypertension: Secondary | ICD-10-CM

## 2015-05-18 DIAGNOSIS — E782 Mixed hyperlipidemia: Secondary | ICD-10-CM

## 2015-05-18 DIAGNOSIS — E1169 Type 2 diabetes mellitus with other specified complication: Secondary | ICD-10-CM

## 2015-05-18 HISTORY — DX: Mixed hyperlipidemia: E78.2

## 2015-05-18 LAB — COMPREHENSIVE METABOLIC PANEL
ALT: 15 U/L (ref 0–35)
AST: 20 U/L (ref 0–37)
Albumin: 4.3 g/dL (ref 3.5–5.2)
Alkaline Phosphatase: 63 U/L (ref 39–117)
BUN: 29 mg/dL — ABNORMAL HIGH (ref 6–23)
CO2: 34 mEq/L — ABNORMAL HIGH (ref 19–32)
Calcium: 9.9 mg/dL (ref 8.4–10.5)
Chloride: 102 mEq/L (ref 96–112)
Creatinine, Ser: 0.89 mg/dL (ref 0.40–1.20)
GFR: 64.96 mL/min (ref >60.00)
Glucose, Bld: 117 mg/dL — ABNORMAL HIGH (ref 70–99)
Potassium: 4.5 mEq/L (ref 3.5–5.1)
Sodium: 142 mEq/L (ref 135–145)
Total Bilirubin: 0.7 mg/dL (ref 0.2–1.2)
Total Protein: 7.9 g/dL (ref 6.0–8.3)

## 2015-05-18 LAB — HEMOGLOBIN A1C: Hgb A1c MFr Bld: 6.5 % (ref 4.6–6.5)

## 2015-05-18 LAB — MAGNESIUM: Magnesium: 2.1 mg/dL (ref 1.5–2.5)

## 2015-05-18 LAB — URIC ACID: Uric Acid, Serum: 6.6 mg/dL (ref 2.4–7.0)

## 2015-05-18 MED ORDER — POTASSIUM CHLORIDE CRYS ER 20 MEQ PO TBCR: 20.0000 meq | EXTENDED_RELEASE_TABLET | Freq: Every day | ORAL | Status: DC

## 2015-05-18 NOTE — Patient Instructions (Signed)

## 2015-05-18 NOTE — Progress Notes (Signed)
Pre visit review using our clinic review tool, if applicable. No additional management support is needed unless otherwise documented below in the visit note. 

## 2015-05-30 DIAGNOSIS — M79609 Pain in unspecified limb: Secondary | ICD-10-CM | POA: Insufficient documentation

## 2015-05-30 NOTE — Assessment & Plan Note (Signed)
Tolerating coumadin. No symptomatic concerns.

## 2015-05-30 NOTE — Assessment & Plan Note (Signed)
Encouraged heart healthy diet, increase exercise, avoid trans fats, consider a krill oil cap daily 

## 2015-05-30 NOTE — Assessment & Plan Note (Signed)
hgba1c acceptable, minimize simple carbs. Increase exercise as tolerated. Continue current meds 

## 2015-05-30 NOTE — Assessment & Plan Note (Signed)
Well controlled, no changes to meds. Encouraged heart healthy diet such as the DASH diet and exercise as tolerated.  °

## 2015-05-30 NOTE — Assessment & Plan Note (Signed)
Pain in toe, no trauma or redness. Try topical treatments and compression hose, notify us if no improvement.

## 2015-05-30 NOTE — Progress Notes (Signed)
Patient ID: [name] Little, female   DOB: 09/14/1933, 82 y.o.   MRN: [id number]   Subjective:    Patient ID: [name] Little, female    DOB: [birth year], 82 y.o.   MRN: [id number]  Chief Complaint  Patient presents with  . Follow-up    HPI Patient is in today for follow up. No recent illness or injuries. She is complaining of pain in her toe in left foot. No other acute concerns. Denies CP/palp/SOB/HA/congestion/fevers/GI or GU c/o. Taking meds as prescribed  Past Medical History  Diagnosis Date  . Personal history of colonic polyps     hyperplastic  . Hemorrhoids   . Thoracic aortic aneurysm ([facility])   . Paroxysmal supraventricular tachycardia ([place])   . Factor V deficiency ([place])   . TMJ pain dysfunction syndrome   . DVT (deep venous thrombosis) ([place])   . Hyperlipemia   . Blood clotting disorder ([facility])     V5 factor  . Clostridium difficile infection   . Hypertension   . Infiltrating ductal carcinoma of breast ([place])     stage one right   . Blood dyscrasia     factor 5 deficiency  . Arthritis     lumbar stenosis, radiculopathy, OA- L hip  . Clotting disorder ([place])   . Anxiety state, unspecified 09/22/2013  . Hyperglycemia 02/26/2014  . Nocturia 02/26/2014  . Low back pain 03/26/2014  . Diabetes mellitus type 2 in obese ([place]) 02/26/2014  . Macular degeneration   . Mixed hyperlipidemia 05/18/2015    Past Surgical History  Procedure Laterality Date  . Total hip arthroplasty  1999    right  . Laparoscopic hysterectomy    . Splenectomy    . Right calf surgery      for cancer  . Colonoscopy    . Upper gastrointestinal endoscopy    . Joint replacement      R hip replacement   . Breast lumpectomy  2010    right Dr. [name]  . Abdominal hysterectomy      1960- ? vaginal hysterectomy, not laparoscopic   . Eye surgery      bilateral cataracts- /w IOL   . Lumbar laminectomy/decompression microdiscectomy  04/11/2011    Procedure: LUMBAR LAMINECTOMY/DECOMPRESSION MICRODISCECTOMY;   Surgeon: [name], MD;  Location: [facility];  Service: Neurosurgery;  Laterality: N/A;  Lumbar Five-Sacral One Microdiscectomy    Family History  Problem Relation Age of Onset  . Heart disease Mother   . Heart disease Father   . Heart disease      uncles  . Clotting disorder Maternal Uncle   . Breast cancer Paternal Aunt   . Cancer Paternal Aunt     breast  . Colon cancer Neg Hx   . Anesthesia problems Neg Hx   . Hypotension Neg Hx   . Malignant hyperthermia Neg Hx   . Pseudochol deficiency Neg Hx     Social History   Social History  . Marital Status: Married    Spouse Name: N/A  . Number of Children: 4  . Years of Education: N/A   Occupational History  . Retired    Social History Main Topics  . Smoking status: Never Smoker   . Smokeless tobacco: Never Used  . Alcohol Use: No  . Drug Use: No  . Sexual Activity:    Partners: Male   Other Topics Concern  . Not on file   Social History Narrative   Married 1955   2 sons - '  38, '61, 2 daughters '55, '57; 8 grandchildren; 1 great grand   I-ADLs          Outpatient Prescriptions Prior to Visit  Medication Sig Dispense Refill  . ALPRAZolam (XANAX) 0.25 MG tablet TAKE 1 TABLET BY MOUH TWICE DAILY AS NEEDED FOR ANXIETY 10 tablet 1  . B Complex Vitamins (VITAMIN B COMPLEX PO) Take by mouth.    . chlorthalidone (HYGROTON) 25 MG tablet Take 1 tablet (25 mg total) by mouth daily. 30 tablet 1  . furosemide (LASIX) 20 MG tablet Take 1 tablet (20 mg total) by mouth daily as needed for fluid or edema. 30 tablet 1  . losartan (COZAAR) 100 MG tablet Take 1 tablet (100 mg total) by mouth daily. 90 tablet 1  . warfarin (COUMADIN) 5 MG tablet TAKE AS DIRECTED BY COUMADIN CLINIC. 90 tablet 3   No facility-administered medications prior to visit.    Allergies  Allergen Reactions  . Amlodipine Swelling    "my whole body swelled"  . Carvedilol Swelling    "my whole body swelled"  . Cefuroxime Axetil     REACTION:  Ddoes not remember reaction  . Statins     Weakness, pain Lipitor and Pravachol  . Sulfonamide Derivatives     REACTION: Rash    Review of Systems  Constitutional: Negative for fever and malaise/fatigue.  HENT: Negative for congestion.   Eyes: Negative for blurred vision.  Respiratory: Negative for shortness of breath.   Cardiovascular: Negative for chest pain, palpitations and leg swelling.  Gastrointestinal: Negative for nausea, abdominal pain and blood in stool.  Genitourinary: Negative for dysuria and frequency.  Musculoskeletal: Positive for joint pain. Negative for falls.  Skin: Negative for rash.  Neurological: Negative for dizziness, loss of consciousness and headaches.  Endo/Heme/Allergies: Negative for environmental allergies.  Psychiatric/Behavioral: Negative for depression. The patient is not nervous/anxious.        Objective:    Physical Exam  Constitutional: She is oriented to person, place, and time. She appears well-developed and well-nourished. No distress.  HENT:  Head: Normocephalic and atraumatic.  Eyes: Conjunctivae are normal.  Neck: Neck supple. No thyromegaly present.  Cardiovascular: Normal rate, regular rhythm and normal heart sounds.   No murmur heard. Pulmonary/Chest: Effort normal and breath sounds normal. No respiratory distress.  Abdominal: Soft. Bowel sounds are normal. She exhibits no distension and no mass. There is no tenderness.  Musculoskeletal: She exhibits no edema.  Lymphadenopathy:    She has no cervical adenopathy.  Neurological: She is alert and oriented to person, place, and time.  Skin: Skin is warm and dry.  Psychiatric: She has a normal mood and affect. Her behavior is normal.    BP 120/80 mmHg  Pulse 72  Temp(Src) 98.4 F (36.9 C) (Oral)  Ht '5\' 3"'  (1.6 m)  Wt 168 lb 8 oz (76.431 kg)  BMI 29.86 kg/m2  SpO2 95% Wt Readings from Last 3 Encounters:  05/18/15 168 lb 8 oz (76.431 kg)  02/08/15 166 lb 6 oz (75.467 kg)    10/20/14 153 lb 6 oz (69.57 kg)     Lab Results  Component Value Date   WBC 8.3 02/08/2015   HGB 14.1 02/08/2015   HCT 43.0 02/08/2015   PLT 357.0 02/08/2015   GLUCOSE 117* 05/18/2015   CHOL 228* 02/08/2015   TRIG 151.0* 02/08/2015   HDL 39.00* 02/08/2015   LDLDIRECT 158.8 07/27/2009   LDLCALC 159* 02/08/2015   ALT 15 05/18/2015   AST 20 05/18/2015  NA 142 05/18/2015   K 4.5 05/18/2015   CL 102 05/18/2015   CREATININE 0.89 05/18/2015   BUN 29* 05/18/2015   CO2 34* 05/18/2015   TSH 3.36 02/08/2015   INR 2.6 05/07/2015   HGBA1C 6.5 05/18/2015   MICROALBUR 1.5 10/20/2014    Lab Results  Component Value Date   TSH 3.36 02/08/2015   Lab Results  Component Value Date   WBC 8.3 02/08/2015   HGB 14.1 02/08/2015   HCT 43.0 02/08/2015   MCV 90.8 02/08/2015   PLT 357.0 02/08/2015   Lab Results  Component Value Date   NA 142 05/18/2015   K 4.5 05/18/2015   CHLORIDE 109 08/12/2013   CO2 34* 05/18/2015   GLUCOSE 117* 05/18/2015   BUN 29* 05/18/2015   CREATININE 0.89 05/18/2015   BILITOT 0.7 05/18/2015   ALKPHOS 63 05/18/2015   AST 20 05/18/2015   ALT 15 05/18/2015   PROT 7.9 05/18/2015   ALBUMIN 4.3 05/18/2015   CALCIUM 9.9 05/18/2015   ANIONGAP 13 11/26/2013   GFR 64.96 05/18/2015   Lab Results  Component Value Date   CHOL 228* 02/08/2015   Lab Results  Component Value Date   HDL 39.00* 02/08/2015   Lab Results  Component Value Date   LDLCALC 159* 02/08/2015   Lab Results  Component Value Date   TRIG 151.0* 02/08/2015   Lab Results  Component Value Date   CHOLHDL 6 02/08/2015   Lab Results  Component Value Date   HGBA1C 6.5 05/18/2015       Assessment & Plan:   Problem List Items Addressed This Visit    Diabetes mellitus type 2 in obese ([place])    hgba1c acceptable, minimize simple carbs. Increase exercise as tolerated. Continue current meds      Relevant Orders   Hemoglobin A1c (Completed)   Essential hypertension    Well  controlled, no changes to meds. Encouraged heart healthy diet such as the DASH diet and exercise as tolerated.       Relevant Orders   Magnesium (Completed)   Comp Met (CMET) (Completed)   Comp Met (CMET)   Hyperlipidemia - Primary    Encouraged heart healthy diet, increase exercise, avoid trans fats, consider a krill oil cap daily      Relevant Orders   Magnesium (Completed)   Comp Met (CMET) (Completed)   Pain in limb    Pain in toe, no trauma or redness. Try topical treatments and compression hose, notify us if no improvement.      Relevant Orders   Uric acid (Completed)      I am having Ms. Blasdel start on potassium chloride SA. I am also having her maintain her furosemide, B Complex Vitamins (VITAMIN B COMPLEX PO), ALPRAZolam, losartan, warfarin, and chlorthalidone.  Meds ordered this encounter  Medications  . potassium chloride SA (K-DUR,KLOR-CON) 20 MEQ tablet    Sig: Take 1 tablet (20 mEq total) by mouth daily.    Dispense:  30 tablet    Refill:  3     Penni Homans, MD

## 2015-06-17 ENCOUNTER — Other Ambulatory Visit (INDEPENDENT_AMBULATORY_CARE_PROVIDER_SITE_OTHER): Payer: Medicare Other

## 2015-06-17 DIAGNOSIS — I10 Essential (primary) hypertension: Secondary | ICD-10-CM | POA: Diagnosis not present

## 2015-06-17 LAB — COMPREHENSIVE METABOLIC PANEL
ALT: 17 U/L (ref 0–35)
AST: 21 U/L (ref 0–37)
Albumin: 4 g/dL (ref 3.5–5.2)
Alkaline Phosphatase: 58 U/L (ref 39–117)
BUN: 27 mg/dL — ABNORMAL HIGH (ref 6–23)
CO2: 26 mEq/L (ref 19–32)
Calcium: 9.4 mg/dL (ref 8.4–10.5)
Chloride: 105 mEq/L (ref 96–112)
Creatinine, Ser: 0.91 mg/dL (ref 0.40–1.20)
GFR: 63.3 mL/min (ref >60.00)
Glucose, Bld: 123 mg/dL — ABNORMAL HIGH (ref 70–99)
Potassium: 3.4 mEq/L — ABNORMAL LOW (ref 3.5–5.1)
Sodium: 139 mEq/L (ref 135–145)
Total Bilirubin: 0.5 mg/dL (ref 0.2–1.2)
Total Protein: 7.3 g/dL (ref 6.0–8.3)

## 2015-06-18 ENCOUNTER — Ambulatory Visit (INDEPENDENT_AMBULATORY_CARE_PROVIDER_SITE_OTHER): Payer: Medicare Other | Admitting: General Practice

## 2015-06-18 DIAGNOSIS — Z86718 Personal history of other venous thrombosis and embolism: Secondary | ICD-10-CM | POA: Diagnosis not present

## 2015-06-18 DIAGNOSIS — Z5181 Encounter for therapeutic drug level monitoring: Secondary | ICD-10-CM | POA: Diagnosis not present

## 2015-06-18 DIAGNOSIS — D682 Hereditary deficiency of other clotting factors: Secondary | ICD-10-CM | POA: Diagnosis not present

## 2015-06-18 LAB — POCT INR: INR: 2.2

## 2015-06-18 NOTE — Progress Notes (Signed)
Pre visit review using our clinic review tool, if applicable. No additional management support is needed unless otherwise documented below in the visit note. 

## 2015-06-18 NOTE — Progress Notes (Signed)
I have reviewed and agree with the plan. 

## 2015-06-30 ENCOUNTER — Other Ambulatory Visit: Payer: Self-pay | Admitting: Family Medicine

## 2015-07-16 ENCOUNTER — Ambulatory Visit (INDEPENDENT_AMBULATORY_CARE_PROVIDER_SITE_OTHER): Payer: Medicare Other | Admitting: Family Medicine

## 2015-07-16 ENCOUNTER — Encounter: Payer: Self-pay | Admitting: Family Medicine

## 2015-07-16 VITALS — BP 127/67 | HR 75 | Temp 99.0°F | Ht 63.0 in | Wt 172.6 lb

## 2015-07-16 DIAGNOSIS — I10 Essential (primary) hypertension: Secondary | ICD-10-CM | POA: Diagnosis not present

## 2015-07-16 LAB — BASIC METABOLIC PANEL
BUN: 33 mg/dL — ABNORMAL HIGH (ref 7–25)
CO2: 30 mmol/L (ref 20–31)
Calcium: 9.3 mg/dL (ref 8.6–10.4)
Chloride: 103 mmol/L (ref 98–110)
Creat: 1.07 mg/dL — ABNORMAL HIGH (ref 0.60–0.93)
Glucose, Bld: 98 mg/dL (ref 65–99)
Potassium: 4.5 mmol/L (ref 3.5–5.3)
Sodium: 139 mmol/L (ref 135–146)

## 2015-07-16 NOTE — Patient Instructions (Signed)
Hypertension Hypertension, commonly called high blood pressure, is when the force of blood pumping through your arteries is too strong. Your arteries are the blood vessels that carry blood from your heart throughout your body. A blood pressure reading consists of a higher number over a lower number, such as 110/72. The higher number (systolic) is the pressure inside your arteries when your heart pumps. The lower number (diastolic) is the pressure inside your arteries when your heart relaxes. Ideally you want your blood pressure below 120/80. Hypertension forces your heart to work harder to pump blood. Your arteries may become narrow or stiff. Having untreated or uncontrolled hypertension can cause heart attack, stroke, kidney disease, and other problems. RISK FACTORS Some risk factors for high blood pressure are controllable. Others are not.  Risk factors you cannot control include:   Race. You may be at higher risk if you are African American.  Age. Risk increases with age.  Gender. Men are at higher risk than women before age 45 years. After age 65, women are at higher risk than men. Risk factors you can control include:  Not getting enough exercise or physical activity.  Being overweight.  Getting too much fat, sugar, calories, or salt in your diet.  Drinking too much alcohol. SIGNS AND SYMPTOMS Hypertension does not usually cause signs or symptoms. Extremely high blood pressure (hypertensive crisis) may cause headache, anxiety, shortness of breath, and nosebleed. DIAGNOSIS To check if you have hypertension, your health care provider will measure your blood pressure while you are seated, with your arm held at the level of your heart. It should be measured at least twice using the same arm. Certain conditions can cause a difference in blood pressure between your right and left arms. A blood pressure reading that is higher than normal on one occasion does not mean that you need treatment. If  it is not clear whether you have high blood pressure, you may be asked to return on a different day to have your blood pressure checked again. Or, you may be asked to monitor your blood pressure at home for 1 or more weeks. TREATMENT Treating high blood pressure includes making lifestyle changes and possibly taking medicine. Living a healthy lifestyle can help lower high blood pressure. You may need to change some of your habits. Lifestyle changes may include:  Following the DASH diet. This diet is high in fruits, vegetables, and whole grains. It is low in salt, red meat, and added sugars.  Keep your sodium intake below 2,300 mg per day.  Getting at least 30-45 minutes of aerobic exercise at least 4 times per week.  Losing weight if necessary.  Not smoking.  Limiting alcoholic beverages.  Learning ways to reduce stress. Your health care provider may prescribe medicine if lifestyle changes are not enough to get your blood pressure under control, and if one of the following is true:  You are 18-59 years of age and your systolic blood pressure is above 140.  You are 60 years of age or older, and your systolic blood pressure is above 150.  Your diastolic blood pressure is above 90.  You have diabetes, and your systolic blood pressure is over 140 or your diastolic blood pressure is over 90.  You have kidney disease and your blood pressure is above 140/90.  You have heart disease and your blood pressure is above 140/90. Your personal target blood pressure may vary depending on your medical conditions, your age, and other factors. HOME CARE INSTRUCTIONS    Have your blood pressure rechecked as directed by your health care provider.   Take medicines only as directed by your health care provider. Follow the directions carefully. Blood pressure medicines must be taken as prescribed. The medicine does not work as well when you skip doses. Skipping doses also puts you at risk for  problems.  Do not smoke.   Monitor your blood pressure at home as directed by your health care provider. SEEK MEDICAL CARE IF:   You think you are having a reaction to medicines taken.  You have recurrent headaches or feel dizzy.  You have swelling in your ankles.  You have trouble with your vision. SEEK IMMEDIATE MEDICAL CARE IF:  You develop a severe headache or confusion.  You have unusual weakness, numbness, or feel faint.  You have severe chest or abdominal pain.  You vomit repeatedly.  You have trouble breathing. MAKE SURE YOU:   Understand these instructions.  Will watch your condition.  Will get help right away if you are not doing well or get worse.   This information is not intended to replace advice given to you by your health care provider. Make sure you discuss any questions you have with your health care provider.   Document Released: 01/16/2005 Document Revised: 06/02/2014 Document Reviewed: 11/08/2012 Elsevier Interactive Patient Education 2016 [employer]  

## 2015-07-17 MED ORDER — FUROSEMIDE 20 MG PO TABS: 20.0000 mg | ORAL_TABLET | Freq: Every day | ORAL | Status: DC | PRN

## 2015-07-17 MED ORDER — LOSARTAN POTASSIUM 100 MG PO TABS: 100.0000 mg | ORAL_TABLET | Freq: Every day | ORAL | Status: DC

## 2015-07-17 NOTE — Progress Notes (Signed)
Patient ID: [name] Little, female    DOB: [birth year]  Age: 82 y.o. MRN: [id number]    Subjective:  Subjective HPI [name] presents for f/u edema, htn-- she feels like the chlorthiadone is causing edema in ankles and palpitations.  No chest pain.  She stopped the med a while ago and symptoms resolved.    Review of Systems  Constitutional: Negative for diaphoresis, appetite change, fatigue and unexpected weight change.  Eyes: Negative for pain, redness and visual disturbance.  Respiratory: Negative for cough, chest tightness, shortness of breath and wheezing.   Cardiovascular: Negative for chest pain, palpitations and leg swelling.  Endocrine: Negative for cold intolerance, heat intolerance, polydipsia, polyphagia and polyuria.  Genitourinary: Negative for dysuria, frequency and difficulty urinating.  Neurological: Negative for dizziness, light-headedness, numbness and headaches.    History Past Medical History  Diagnosis Date  . Personal history of colonic polyps     hyperplastic  . Hemorrhoids   . Thoracic aortic aneurysm ([place])   . Paroxysmal supraventricular tachycardia ([place])   . Factor V deficiency ([place])   . TMJ pain dysfunction syndrome   . DVT (deep venous thrombosis) ([place])   . Hyperlipemia   . Blood clotting disorder ([facility])     V5 factor  . Clostridium difficile infection   . Hypertension   . Infiltrating ductal carcinoma of breast ([place])     stage one right   . Blood dyscrasia     factor 5 deficiency  . Arthritis     lumbar stenosis, radiculopathy, OA- L hip  . Clotting disorder ([place])   . Anxiety state, unspecified 09/22/2013  . Hyperglycemia 02/26/2014  . Nocturia 02/26/2014  . Low back pain 03/26/2014  . Diabetes mellitus type 2 in obese ([place]) 02/26/2014  . Macular degeneration   . Mixed hyperlipidemia 05/18/2015    She has past surgical history that includes Total hip arthroplasty (1999); Laparoscopic hysterectomy; Splenectomy; right calf surgery;  Colonoscopy; Upper gastrointestinal endoscopy; Joint replacement; Breast lumpectomy (2010); Abdominal hysterectomy; Eye surgery; and Lumbar laminectomy/decompression microdiscectomy (04/11/2011).   Her family history includes Breast cancer in her paternal aunt; Cancer in her paternal aunt; Clotting disorder in her maternal uncle; Heart disease in her father and mother. There is no history of Colon cancer, Anesthesia problems, Hypotension, Malignant hyperthermia, or Pseudochol deficiency.She reports that she has never smoked. She has never used smokeless tobacco. She reports that she does not drink alcohol or use illicit drugs.  Current Outpatient Prescriptions on File Prior to Visit  Medication Sig Dispense Refill  . ALPRAZolam (XANAX) 0.25 MG tablet TAKE 1 TABLET BY MOUH TWICE DAILY AS NEEDED FOR ANXIETY 10 tablet 1  . B Complex Vitamins (VITAMIN B COMPLEX PO) Take by mouth.    . potassium chloride SA (K-DUR,KLOR-CON) 20 MEQ tablet Take 1 tablet (20 mEq total) by mouth daily. 30 tablet 3  . warfarin (COUMADIN) 5 MG tablet TAKE AS DIRECTED BY COUMADIN CLINIC. 90 tablet 3   No current facility-administered medications on file prior to visit.     Objective:  Objective Physical Exam  Constitutional: She is oriented to person, place, and time. She appears well-developed and well-nourished.  HENT:  Head: Normocephalic and atraumatic.  Eyes: Conjunctivae and EOM are normal.  Neck: Normal range of motion. Neck supple. No JVD present. Carotid bruit is not present. No thyromegaly present.  Cardiovascular: Normal rate, regular rhythm and normal heart sounds.   No murmur heard. Pulmonary/Chest: Effort normal and breath sounds normal. No respiratory distress.  She has no wheezes. She has no rales. She exhibits no tenderness.  Musculoskeletal: She exhibits no edema.  Neurological: She is alert and oriented to person, place, and time.  Psychiatric: She has a normal mood and affect. Her behavior is normal.  Judgment and thought content normal.  Nursing note and vitals reviewed.  BP 127/67 mmHg  Pulse 75  Temp(Src) 99 F (37.2 C) (Oral)  Ht 5\' 3"  (1.6 m)  Wt 172 lb 9.6 oz (78.291 kg)  BMI 30.58 kg/m2  SpO2 75% Wt Readings from Last 3 Encounters:  07/16/15 172 lb 9.6 oz (78.291 kg)  05/18/15 168 lb 8 oz (76.431 kg)  02/08/15 166 lb 6 oz (75.467 kg)     Lab Results  Component Value Date   WBC 8.3 02/08/2015   HGB 14.1 02/08/2015   HCT 43.0 02/08/2015   PLT 357.0 02/08/2015   GLUCOSE 98 07/16/2015   CHOL 228* 02/08/2015   TRIG 151.0* 02/08/2015   HDL 39.00* 02/08/2015   LDLDIRECT 158.8 07/27/2009   LDLCALC 159* 02/08/2015   ALT 17 06/17/2015   AST 21 06/17/2015   NA 139 07/16/2015   K 4.5 07/16/2015   CL 103 07/16/2015   CREATININE 1.07* 07/16/2015   BUN 33* 07/16/2015   CO2 30 07/16/2015   TSH 3.36 02/08/2015   INR 2.2 06/18/2015   HGBA1C 6.5 05/18/2015   MICROALBUR 1.5 10/20/2014    Mm Diag Breast Tomo Bilateral  08/14/2014  CLINICAL DATA:  History of treated right breast cancer, status post lumpectomy and radiation therapy in 2010. EXAM: DIGITAL DIAGNOSTIC BILATERAL MAMMOGRAM WITH 3D TOMOSYNTHESIS AND CAD COMPARISON:  Previous exam(s). ACR Breast Density Category b: There are scattered areas of fibroglandular density. FINDINGS: There are no suspicious masses, areas of nonsurgical architectural distortion or microcalcifications in either breast. There are stable post lumpectomy and post treatment changes in the right breast. Mammographic images were processed with CAD. IMPRESSION: No mammographic evidence of malignancy in either breast, status post right lumpectomy. RECOMMENDATION: Diagnostic mammogram is suggested in 1 year. (Code:DM-B-01Y) I have discussed the findings and recommendations with the patient. Results were also provided in writing at the conclusion of the visit. If applicable, a reminder letter will be sent to the patient regarding the next appointment. BI-RADS  CATEGORY  2: Benign. Electronically Signed   By: Fidela Salisbury M.D.   On: 08/14/2014 10:39     Assessment & Plan:  Plan I have discontinued Ms. Cua's chlorthalidone. I am also having her maintain her B Complex Vitamins (VITAMIN B COMPLEX PO), ALPRAZolam, warfarin, potassium chloride SA, losartan, and furosemide.  Meds ordered this encounter  Medications  . losartan (COZAAR) 100 MG tablet    Sig: Take 1 tablet (100 mg total) by mouth daily.    Dispense:  90 tablet    Refill:  1  . furosemide (LASIX) 20 MG tablet    Sig: Take 1 tablet (20 mg total) by mouth daily as needed for fluid or edema.    Dispense:  30 tablet    Refill:  1    Problem List Items Addressed This Visit    Essential hypertension - Primary   Relevant Medications   losartan (COZAAR) 100 MG tablet   furosemide (LASIX) 20 MG tablet   Other Relevant Orders   Basic Metabolic Panel (BMET) (Completed)      Follow-up: Return in about 3 months (around 10/16/2015), or if symptoms worsen or fail to improve, for hypertension.  Ann Held, DO

## 2015-07-23 DIAGNOSIS — R7989 Other specified abnormal findings of blood chemistry: Secondary | ICD-10-CM

## 2015-07-26 ENCOUNTER — Telehealth: Payer: Self-pay | Admitting: Family Medicine

## 2015-07-26 NOTE — Telephone Encounter (Signed)
Patient was seen by Dr. Etter Sjogren on the 07/16/15 and had labs done.  Based on her results (results documented dehydrated and to increase fluid intake, patient did verbalize understanding/and is doing so).  She states they mentioned a problem with her liver and that has her very worried.  She would like her own PCP's explanation of these results/liver problem???

## 2015-07-26 NOTE — Telephone Encounter (Signed)
Patient informed of PCP instructions and will discuss all information at her next scheduled OV with PCP in July.

## 2015-07-26 NOTE — Telephone Encounter (Signed)
They did not check her liver with labs or imaging so not sure what they might have said. Her kidneys actully look pretty good and just suggest possible mild dehydration. Not a number that is concerning at this time can run a CMP in 2 weeks if she is worried or she is welcome ot come in to discuss

## 2015-07-27 ENCOUNTER — Other Ambulatory Visit (INDEPENDENT_AMBULATORY_CARE_PROVIDER_SITE_OTHER): Payer: Medicare Other

## 2015-07-27 ENCOUNTER — Telehealth: Payer: Self-pay | Admitting: Family Medicine

## 2015-07-27 DIAGNOSIS — R1030 Lower abdominal pain, unspecified: Secondary | ICD-10-CM

## 2015-07-27 NOTE — Telephone Encounter (Signed)
Pt called in to speak with CMA. Pt says that her feet are swollen. Offered an appt and also offered the pt to speak with a nurse. Pt declined and said that she only wants to speak with [name] Little,  Dr. B CMA.   [name] pt would like a call back on home phone if not available, please try pt on her cell.

## 2015-07-27 NOTE — Telephone Encounter (Signed)
Spoke to the patient and feet are swollen/bottoms has of feet are swollen too (for 2 days), also states her urine is gold, and she is drinking nothing but water. She states when she does urinate has some soreness in her lower abdomen.  PCP was verbally informed of patient information. PCP ordered a ua/culture/patient contacted and scheduled lab appt. For today 07/27/2015 at 4 pm.

## 2015-07-28 ENCOUNTER — Ambulatory Visit (INDEPENDENT_AMBULATORY_CARE_PROVIDER_SITE_OTHER): Payer: Medicare Other | Admitting: General Practice

## 2015-07-28 ENCOUNTER — Telehealth: Payer: Self-pay

## 2015-07-28 DIAGNOSIS — Z5181 Encounter for therapeutic drug level monitoring: Secondary | ICD-10-CM

## 2015-07-28 DIAGNOSIS — Z86718 Personal history of other venous thrombosis and embolism: Secondary | ICD-10-CM | POA: Diagnosis not present

## 2015-07-28 DIAGNOSIS — D682 Hereditary deficiency of other clotting factors: Secondary | ICD-10-CM

## 2015-07-28 LAB — URINALYSIS
Bilirubin Urine: NEGATIVE
Hgb urine dipstick: NEGATIVE
Ketones, ur: NEGATIVE
Leukocytes, UA: NEGATIVE
Nitrite: NEGATIVE
Specific Gravity, Urine: 1.025 (ref 1.000–1.030)
Total Protein, Urine: NEGATIVE
Urine Glucose: NEGATIVE
Urobilinogen, UA: 0.2 (ref 0.0–1.0)
pH: 5.5 (ref 5.0–8.0)

## 2015-07-28 LAB — POCT INR: INR: 2.2

## 2015-07-28 NOTE — Progress Notes (Signed)
Pre visit review using our clinic review tool, if applicable. No additional management support is needed unless otherwise documented below in the visit note. 

## 2015-07-28 NOTE — Progress Notes (Signed)
I have reviewed and agree with the plan. 

## 2015-07-28 NOTE — Telephone Encounter (Signed)
Patient is on the list for [employer] 2017 and may be a good candidate for an AWV in 2017. Please let me know if/when appt is scheduled.   

## 2015-07-30 ENCOUNTER — Other Ambulatory Visit: Payer: Self-pay | Admitting: Family Medicine

## 2015-07-30 DIAGNOSIS — Z853 Personal history of malignant neoplasm of breast: Secondary | ICD-10-CM

## 2015-07-30 LAB — CULTURE, URINE COMPREHENSIVE: Colony Count: 90000

## 2015-07-30 MED ORDER — CIPROFLOXACIN HCL 250 MG PO TABS: 250.0000 mg | ORAL_TABLET | Freq: Two times a day (BID) | ORAL | Status: DC

## 2015-07-30 NOTE — Telephone Encounter (Signed)
LVM advising patient of message below °

## 2015-08-09 ENCOUNTER — Other Ambulatory Visit (INDEPENDENT_AMBULATORY_CARE_PROVIDER_SITE_OTHER): Payer: Medicare Other

## 2015-08-09 ENCOUNTER — Other Ambulatory Visit: Payer: Medicare Other

## 2015-08-09 ENCOUNTER — Other Ambulatory Visit: Payer: Self-pay | Admitting: Family Medicine

## 2015-08-09 DIAGNOSIS — R748 Abnormal levels of other serum enzymes: Secondary | ICD-10-CM

## 2015-08-09 DIAGNOSIS — R7989 Other specified abnormal findings of blood chemistry: Secondary | ICD-10-CM

## 2015-08-09 DIAGNOSIS — I10 Essential (primary) hypertension: Secondary | ICD-10-CM

## 2015-08-09 MED ORDER — FUROSEMIDE 20 MG PO TABS: 20.0000 mg | ORAL_TABLET | Freq: Every day | ORAL | Status: DC | PRN

## 2015-08-10 LAB — BASIC METABOLIC PANEL
BUN: 21 mg/dL (ref 6–23)
CO2: 25 mEq/L (ref 19–32)
Calcium: 9.3 mg/dL (ref 8.4–10.5)
Chloride: 108 mEq/L (ref 96–112)
Creatinine, Ser: 0.85 mg/dL (ref 0.40–1.20)
GFR: 68.46 mL/min (ref >60.00)
Glucose, Bld: 104 mg/dL — ABNORMAL HIGH (ref 70–99)
Potassium: 4.4 mEq/L (ref 3.5–5.1)
Sodium: 140 mEq/L (ref 135–145)

## 2015-08-16 ENCOUNTER — Ambulatory Visit
Admission: RE | Admit: 2015-08-16 | Discharge: 2015-08-16 | Disposition: A | Discharge status: Home / Self Care | Payer: Medicare Other | Source: Ambulatory Visit | Attending: Family Medicine | Admitting: Family Medicine

## 2015-08-16 DIAGNOSIS — Z853 Personal history of malignant neoplasm of breast: Secondary | ICD-10-CM

## 2015-08-17 ENCOUNTER — Ambulatory Visit: Payer: Medicare Other | Admitting: Family Medicine

## 2015-08-20 ENCOUNTER — Ambulatory Visit: Payer: Medicare Other | Admitting: Family Medicine

## 2015-08-23 ENCOUNTER — Other Ambulatory Visit: Payer: Self-pay | Admitting: Family Medicine

## 2015-08-23 DIAGNOSIS — I10 Essential (primary) hypertension: Secondary | ICD-10-CM

## 2015-08-23 MED ORDER — LOSARTAN POTASSIUM 100 MG PO TABS
100.0000 mg | ORAL_TABLET | Freq: Every day | ORAL | 1 refills | Status: DC
Start: 2015-08-23 — End: 2016-03-02

## 2015-08-24 ENCOUNTER — Ambulatory Visit: Payer: Medicare Other | Admitting: Family Medicine

## 2015-09-08 ENCOUNTER — Ambulatory Visit: Payer: Medicare Other

## 2015-09-15 ENCOUNTER — Ambulatory Visit (INDEPENDENT_AMBULATORY_CARE_PROVIDER_SITE_OTHER): Payer: Medicare Other | Admitting: General Practice

## 2015-09-15 DIAGNOSIS — Z86718 Personal history of other venous thrombosis and embolism: Secondary | ICD-10-CM

## 2015-09-15 DIAGNOSIS — D682 Hereditary deficiency of other clotting factors: Secondary | ICD-10-CM | POA: Diagnosis not present

## 2015-09-15 DIAGNOSIS — Z5181 Encounter for therapeutic drug level monitoring: Secondary | ICD-10-CM

## 2015-09-15 LAB — POCT INR: INR: 2.7

## 2015-09-22 ENCOUNTER — Telehealth: Payer: Self-pay | Admitting: Family Medicine

## 2015-09-22 DIAGNOSIS — R399 Unspecified symptoms and signs involving the genitourinary system: Secondary | ICD-10-CM

## 2015-09-22 NOTE — Telephone Encounter (Signed)
PCP out of office today, recommend scheduling appt with another provider.

## 2015-09-22 NOTE — Telephone Encounter (Signed)
Please order UA with c and s for 8/23 for UTIr

## 2015-09-22 NOTE — Telephone Encounter (Signed)
Patient denied coming in tonight and expressed she will be in first thing in the morning 09/22/15 to drop off urine.

## 2015-09-22 NOTE — Telephone Encounter (Signed)
Per CMA (sharon) PA (cody) will place orders for pt to come in and give urine. Pt says that she will come in as soon as she can.     She is so grateful.     Thanks.

## 2015-09-22 NOTE — Telephone Encounter (Signed)
Spoke with provider, being familiar with PCP process, I requested VO to placed urine lab orders and had [place] call pt back to come in now and give urine sample under my provider's name, for U/A w/reflex micro, culture and after speaking with lab, entered POCT for urine dip/automated. We will treat according to urine dip results today/SLS 08/23

## 2015-09-22 NOTE — Telephone Encounter (Signed)
Caller name: Dimitri  Relation to pt: self Call back number: [phone] Pharmacy:  Reason for call: Pt states is having bladder problem a real bad burning sensation and would like to know if she can have a urine test done at the lab, pt states since she is in pain prefers to have labs done. Pt had to use vaseline for any relief. Please advise.

## 2015-09-22 NOTE — Telephone Encounter (Signed)
Pt called back in to follow up. Informed her of the previous response. Pt says that this is common and she doesn't want to schedule an appt just to give a urine sample. Pt says that Dr. B usually let her come in to lab . Pt requested that I send message to her PCP/ CMA directly. Pt is requesting a call back from [facility] [place] upon returning to office.

## 2015-09-22 NOTE — Telephone Encounter (Signed)
FYI: Just to make you aware of this matter and that lab will be contacting you tomorrow morning for patient Tx request, if needed, after urine dip. Thanks so much/SLS 08/23

## 2015-09-23 ENCOUNTER — Other Ambulatory Visit: Payer: Self-pay | Admitting: Family Medicine

## 2015-09-23 ENCOUNTER — Other Ambulatory Visit (INDEPENDENT_AMBULATORY_CARE_PROVIDER_SITE_OTHER): Payer: Medicare Other

## 2015-09-23 DIAGNOSIS — N390 Urinary tract infection, site not specified: Secondary | ICD-10-CM

## 2015-09-23 LAB — URINALYSIS, ROUTINE W REFLEX MICROSCOPIC
Bilirubin Urine: NEGATIVE
Hgb urine dipstick: NEGATIVE
Ketones, ur: NEGATIVE
Leukocytes, UA: NEGATIVE
Nitrite: POSITIVE — AB
Specific Gravity, Urine: 1.02 (ref 1.000–1.030)
Total Protein, Urine: NEGATIVE
Urine Glucose: NEGATIVE
Urobilinogen, UA: 0.2 (ref 0.0–1.0)
pH: 6 (ref 5.0–8.0)

## 2015-09-23 NOTE — Telephone Encounter (Signed)
Relation to [id number] Call back number:[phone] Pharmacy: [id number] - [place], [place] [phone] ([phone] (Fax)    Reason for call:  Patient inquiring about urine results

## 2015-09-23 NOTE — Telephone Encounter (Signed)
Orders entered

## 2015-09-24 ENCOUNTER — Other Ambulatory Visit: Payer: Self-pay | Admitting: Family Medicine

## 2015-09-24 MED ORDER — CIPROFLOXACIN HCL 250 MG PO TABS: 250.0000 mg | ORAL_TABLET | Freq: Two times a day (BID) | ORAL | 0 refills | Status: DC

## 2015-09-27 ENCOUNTER — Encounter: Payer: Self-pay | Admitting: Family Medicine

## 2015-09-27 ENCOUNTER — Ambulatory Visit (INDEPENDENT_AMBULATORY_CARE_PROVIDER_SITE_OTHER): Payer: Medicare Other | Admitting: Family Medicine

## 2015-09-27 VITALS — BP 142/86 | HR 88 | Temp 98.1°F | Ht 63.0 in | Wt 173.5 lb

## 2015-09-27 DIAGNOSIS — E119 Type 2 diabetes mellitus without complications: Secondary | ICD-10-CM

## 2015-09-27 DIAGNOSIS — I872 Venous insufficiency (chronic) (peripheral): Secondary | ICD-10-CM

## 2015-09-27 DIAGNOSIS — I10 Essential (primary) hypertension: Secondary | ICD-10-CM

## 2015-09-27 DIAGNOSIS — E785 Hyperlipidemia, unspecified: Secondary | ICD-10-CM

## 2015-09-27 DIAGNOSIS — N390 Urinary tract infection, site not specified: Secondary | ICD-10-CM

## 2015-09-27 DIAGNOSIS — E1169 Type 2 diabetes mellitus with other specified complication: Secondary | ICD-10-CM

## 2015-09-27 DIAGNOSIS — E669 Obesity, unspecified: Secondary | ICD-10-CM

## 2015-09-27 DIAGNOSIS — C4441 Basal cell carcinoma of skin of scalp and neck: Secondary | ICD-10-CM

## 2015-09-27 HISTORY — DX: Basal cell carcinoma of skin of scalp and neck: C44.41

## 2015-09-27 LAB — CBC WITH DIFFERENTIAL/PLATELET
Basophils Absolute: 0.1 10*3/uL (ref 0.0–0.1)
Basophils Relative: 0.5 % (ref 0.0–3.0)
Eosinophils Absolute: 0.3 10*3/uL (ref 0.0–0.7)
Eosinophils Relative: 2.9 % (ref 0.0–5.0)
HCT: 41.2 % (ref 36.0–46.0)
Hemoglobin: 13.8 g/dL (ref 12.0–15.0)
Lymphocytes Relative: 30.5 % (ref 12.0–46.0)
Lymphs Abs: 3 10*3/uL (ref 0.7–4.0)
MCHC: 33.5 g/dL (ref 30.0–36.0)
MCV: 90.4 fl (ref 78.0–100.0)
Monocytes Absolute: 1.2 10*3/uL — ABNORMAL HIGH (ref 0.1–1.0)
Monocytes Relative: 12.3 % — ABNORMAL HIGH (ref 3.0–12.0)
Neutro Abs: 5.3 10*3/uL (ref 1.4–7.7)
Neutrophils Relative %: 53.8 % (ref 43.0–77.0)
Platelets: 369 10*3/uL (ref 150.0–400.0)
RBC: 4.55 Mil/uL (ref 3.87–5.11)
RDW: 14 % (ref 11.5–15.5)
WBC: 9.9 10*3/uL (ref 4.0–10.5)

## 2015-09-27 LAB — COMPREHENSIVE METABOLIC PANEL
ALT: 16 U/L (ref 0–35)
AST: 20 U/L (ref 0–37)
Albumin: 4 g/dL (ref 3.5–5.2)
Alkaline Phosphatase: 64 U/L (ref 39–117)
BUN: 19 mg/dL (ref 6–23)
CO2: 29 mEq/L (ref 19–32)
Calcium: 9.3 mg/dL (ref 8.4–10.5)
Chloride: 105 mEq/L (ref 96–112)
Creatinine, Ser: 0.92 mg/dL (ref 0.40–1.20)
GFR: 62.46 mL/min (ref >60.00)
Glucose, Bld: 121 mg/dL — ABNORMAL HIGH (ref 70–99)
Potassium: 3.8 mEq/L (ref 3.5–5.1)
Sodium: 140 mEq/L (ref 135–145)
Total Bilirubin: 0.4 mg/dL (ref 0.2–1.2)
Total Protein: 7.7 g/dL (ref 6.0–8.3)

## 2015-09-27 LAB — LIPID PANEL
Cholesterol: 215 mg/dL — ABNORMAL HIGH (ref 0–200)
HDL: 34.1 mg/dL — ABNORMAL LOW (ref >39.00)
NonHDL: 180.75
Total CHOL/HDL Ratio: 6
Triglycerides: 246 mg/dL — ABNORMAL HIGH (ref 0.0–149.0)
VLDL: 49.2 mg/dL — ABNORMAL HIGH (ref 0.0–40.0)

## 2015-09-27 LAB — LDL CHOLESTEROL, DIRECT: Direct LDL: 135 mg/dL

## 2015-09-27 LAB — HEMOGLOBIN A1C: Hgb A1c MFr Bld: 6.1 % (ref 4.6–6.5)

## 2015-09-27 LAB — TSH: TSH: 3.09 u[IU]/mL (ref 0.35–4.50)

## 2015-09-27 NOTE — Progress Notes (Signed)
Pre visit review using our clinic review tool, if applicable. No additional management support is needed unless otherwise documented below in the visit note. 

## 2015-09-27 NOTE — Patient Instructions (Signed)
Hypertension Hypertension, commonly called high blood pressure, is when the force of blood pumping through your arteries is too strong. Your arteries are the blood vessels that carry blood from your heart throughout your body. A blood pressure reading consists of a higher number over a lower number, such as 110/72. The higher number (systolic) is the pressure inside your arteries when your heart pumps. The lower number (diastolic) is the pressure inside your arteries when your heart relaxes. Ideally you want your blood pressure below 120/80. Hypertension forces your heart to work harder to pump blood. Your arteries may become narrow or stiff. Having untreated or uncontrolled hypertension can cause heart attack, stroke, kidney disease, and other problems. RISK FACTORS Some risk factors for high blood pressure are controllable. Others are not.  Risk factors you cannot control include:   Race. You may be at higher risk if you are African American.  Age. Risk increases with age.  Gender. Men are at higher risk than women before age 45 years. After age 65, women are at higher risk than men. Risk factors you can control include:  Not getting enough exercise or physical activity.  Being overweight.  Getting too much fat, sugar, calories, or salt in your diet.  Drinking too much alcohol. SIGNS AND SYMPTOMS Hypertension does not usually cause signs or symptoms. Extremely high blood pressure (hypertensive crisis) may cause headache, anxiety, shortness of breath, and nosebleed. DIAGNOSIS To check if you have hypertension, your health care provider will measure your blood pressure while you are seated, with your arm held at the level of your heart. It should be measured at least twice using the same arm. Certain conditions can cause a difference in blood pressure between your right and left arms. A blood pressure reading that is higher than normal on one occasion does not mean that you need treatment. If  it is not clear whether you have high blood pressure, you may be asked to return on a different day to have your blood pressure checked again. Or, you may be asked to monitor your blood pressure at home for 1 or more weeks. TREATMENT Treating high blood pressure includes making lifestyle changes and possibly taking medicine. Living a healthy lifestyle can help lower high blood pressure. You may need to change some of your habits. Lifestyle changes may include:  Following the DASH diet. This diet is high in fruits, vegetables, and whole grains. It is low in salt, red meat, and added sugars.  Keep your sodium intake below 2,300 mg per day.  Getting at least 30-45 minutes of aerobic exercise at least 4 times per week.  Losing weight if necessary.  Not smoking.  Limiting alcoholic beverages.  Learning ways to reduce stress. Your health care provider may prescribe medicine if lifestyle changes are not enough to get your blood pressure under control, and if one of the following is true:  You are 18-59 years of age and your systolic blood pressure is above 140.  You are 60 years of age or older, and your systolic blood pressure is above 150.  Your diastolic blood pressure is above 90.  You have diabetes, and your systolic blood pressure is over 140 or your diastolic blood pressure is over 90.  You have kidney disease and your blood pressure is above 140/90.  You have heart disease and your blood pressure is above 140/90. Your personal target blood pressure may vary depending on your medical conditions, your age, and other factors. HOME CARE INSTRUCTIONS    Have your blood pressure rechecked as directed by your health care provider.   Take medicines only as directed by your health care provider. Follow the directions carefully. Blood pressure medicines must be taken as prescribed. The medicine does not work as well when you skip doses. Skipping doses also puts you at risk for  problems.  Do not smoke.   Monitor your blood pressure at home as directed by your health care provider. SEEK MEDICAL CARE IF:   You think you are having a reaction to medicines taken.  You have recurrent headaches or feel dizzy.  You have swelling in your ankles.  You have trouble with your vision. SEEK IMMEDIATE MEDICAL CARE IF:  You develop a severe headache or confusion.  You have unusual weakness, numbness, or feel faint.  You have severe chest or abdominal pain.  You vomit repeatedly.  You have trouble breathing. MAKE SURE YOU:   Understand these instructions.  Will watch your condition.  Will get help right away if you are not doing well or get worse.   This information is not intended to replace advice given to you by your health care provider. Make sure you discuss any questions you have with your health care provider.   Document Released: 01/16/2005 Document Revised: 06/02/2014 Document Reviewed: 11/08/2012 Elsevier Interactive Patient Education 2016 [employer]  

## 2015-09-28 LAB — URINALYSIS
Bilirubin Urine: NEGATIVE
Hgb urine dipstick: NEGATIVE
Ketones, ur: NEGATIVE
Leukocytes, UA: NEGATIVE
Nitrite: NEGATIVE
Specific Gravity, Urine: >=1.03 — AB (ref 1.000–1.030)
Total Protein, Urine: NEGATIVE
Urine Glucose: NEGATIVE
Urobilinogen, UA: 0.2 (ref 0.0–1.0)
pH: 5.5 (ref 5.0–8.0)

## 2015-09-28 LAB — URINE CULTURE: Organism ID, Bacteria: NO GROWTH

## 2015-10-10 DIAGNOSIS — N390 Urinary tract infection, site not specified: Secondary | ICD-10-CM | POA: Insufficient documentation

## 2015-10-10 NOTE — Assessment & Plan Note (Signed)
hgba1c acceptable, minimize simple carbs. Increase exercise as tolerated. Continue current meds 

## 2015-10-10 NOTE — Assessment & Plan Note (Signed)
Encouraged heart healthy diet, increase exercise, avoid trans fats, consider a krill oil cap daily 

## 2015-10-10 NOTE — Assessment & Plan Note (Signed)
Encouraged DASH diet, decrease po intake and increase exercise as tolerated. Needs 7-8 hours of sleep nightly. Avoid trans fats, eat small, frequent meals every 4-5 hours with lean proteins, complex carbs and healthy fats. Minimize simple carbs 

## 2015-10-10 NOTE — Progress Notes (Signed)
Patient ID: [name] Little, female   DOB: 18-Aug-1933, 82 y.o.   MRN: [id number]   Subjective:    Patient ID: [name] Little, female    DOB: [birth year], 82 y.o.   MRN: [id number]  Chief Complaint  Patient presents with  . Follow-up    HPI Patient is in today for follow up. She is recenlty treated with antibiotics for UTI but is still noting some mild urinary frequency. Dysuria has resolved. No fevers, chills. Is noting some mild pedal edema b/l. Denies CP/palp/SOB/HA/congestion/fevers/GI c/o. Taking meds as prescribed  Past Medical History:  Diagnosis Date  . Anxiety state, unspecified 09/22/2013  . Arthritis    lumbar stenosis, radiculopathy, OA- L hip  . BCC (basal cell carcinoma), scalp/neck 09/27/2015  . Blood clotting disorder (HCC)    V5 factor  . Blood dyscrasia    factor 5 deficiency  . Clostridium difficile infection   . Clotting disorder ([place])   . Diabetes mellitus type 2 in obese ([place]) 02/26/2014  . DVT (deep venous thrombosis) ([place])   . Factor V deficiency ([place])   . Hemorrhoids   . Hyperglycemia 02/26/2014  . Hyperlipemia   . Hypertension   . Infiltrating ductal carcinoma of breast ([place])    stage one right   . Low back pain 03/26/2014  . Macular degeneration   . Mixed hyperlipidemia 05/18/2015  . Nocturia 02/26/2014  . Paroxysmal supraventricular tachycardia ([place])   . Personal history of colonic polyps    hyperplastic  . Thoracic aortic aneurysm ([place])   . TMJ pain dysfunction syndrome     Past Surgical History:  Procedure Laterality Date  . ABDOMINAL HYSTERECTOMY     1960- ? vaginal hysterectomy, not laparoscopic   . BREAST LUMPECTOMY  2010   right Dr. [name]  . COLONOSCOPY    . EYE SURGERY     bilateral cataracts- /w IOL   . JOINT REPLACEMENT     R hip replacement   . LAPAROSCOPIC HYSTERECTOMY    . LUMBAR LAMINECTOMY/DECOMPRESSION MICRODISCECTOMY  04/11/2011   Procedure: LUMBAR LAMINECTOMY/DECOMPRESSION MICRODISCECTOMY;  Surgeon: [name], MD;   Location: [facility];  Service: Neurosurgery;  Laterality: N/A;  Lumbar Five-Sacral One Microdiscectomy  . right calf surgery     for cancer  . SPLENECTOMY    . TOTAL HIP ARTHROPLASTY  1999   right  . UPPER GASTROINTESTINAL ENDOSCOPY      Family History  Problem Relation Age of Onset  . Heart disease Mother   . Heart disease Father   . Heart disease      uncles  . Clotting disorder Maternal Uncle   . Breast cancer Paternal Aunt   . Cancer Paternal Aunt     breast  . Colon cancer Neg Hx   . Anesthesia problems Neg Hx   . Hypotension Neg Hx   . Malignant hyperthermia Neg Hx   . Pseudochol deficiency Neg Hx     Social History   Social History  . Marital status: Married    Spouse name: N/A  . Number of children: 4  . Years of education: N/A   Occupational History  . Retired    Social History Main Topics  . Smoking status: Never Smoker  . Smokeless tobacco: Never Used  . Alcohol use No  . Drug use: No  . Sexual activity: Yes    Partners: Male   Other Topics Concern  . Not on file   Social History Narrative   Married 1955  2 sons - '59, '61, 2 daughters '55, '57; 8 grandchildren; 1 great grand   I-ADLs          Outpatient Medications Prior to Visit  Medication Sig Dispense Refill  . ALPRAZolam (XANAX) 0.25 MG tablet TAKE 1 TABLET BY MOUH TWICE DAILY AS NEEDED FOR ANXIETY 10 tablet 1  . B Complex Vitamins (VITAMIN B COMPLEX PO) Take by mouth.    . furosemide (LASIX) 20 MG tablet Take 1 tablet (20 mg total) by mouth daily as needed for fluid or edema. 30 tablet 1  . losartan (COZAAR) 100 MG tablet Take 1 tablet (100 mg total) by mouth daily. 90 tablet 1  . potassium chloride SA (K-DUR,KLOR-CON) 20 MEQ tablet Take 1 tablet (20 mEq total) by mouth daily. 30 tablet 3  . warfarin (COUMADIN) 5 MG tablet TAKE AS DIRECTED BY COUMADIN CLINIC. 90 tablet 3  . ciprofloxacin (CIPRO) 250 MG tablet Take 1 tablet (250 mg total) by mouth 2 (two) times daily. Take for 3 days  6 tablet 0   No facility-administered medications prior to visit.     Allergies  Allergen Reactions  . Amlodipine Swelling    "my whole body swelled"  . Carvedilol Swelling    "my whole body swelled"  . Cefuroxime Axetil     REACTION: Ddoes not remember reaction  . Statins     Weakness, pain Lipitor and Pravachol  . Sulfonamide Derivatives     REACTION: Rash    Review of Systems  Constitutional: Negative for fever and malaise/fatigue.  HENT: Negative for congestion.   Eyes: Negative for blurred vision.  Respiratory: Negative for shortness of breath.   Cardiovascular: Positive for chest pain and leg swelling. Negative for palpitations.  Gastrointestinal: Negative for abdominal pain, blood in stool and nausea.  Genitourinary: Positive for frequency and urgency. Negative for dysuria and hematuria.  Musculoskeletal: Negative for falls.  Skin: Negative for rash.  Neurological: Negative for dizziness, loss of consciousness and headaches.  Endo/Heme/Allergies: Negative for environmental allergies.  Psychiatric/Behavioral: Negative for depression. The patient is not nervous/anxious.        Objective:    Physical Exam  Constitutional: She is oriented to person, place, and time. She appears well-developed and well-nourished. No distress.  HENT:  Head: Normocephalic and atraumatic.  Nose: Nose normal.  Eyes: Right eye exhibits no discharge. Left eye exhibits no discharge.  Neck: Normal range of motion. Neck supple.  Cardiovascular: Normal rate and regular rhythm.   No murmur heard. Pulmonary/Chest: Effort normal and breath sounds normal.  Abdominal: Soft. Bowel sounds are normal. There is no tenderness.  Musculoskeletal: She exhibits edema.  Neurological: She is alert and oriented to person, place, and time.  Skin: Skin is warm and dry.  Psychiatric: She has a normal mood and affect.  Nursing note and vitals reviewed.   BP (!) 142/86   Pulse 88   Temp 98.1 F (36.7 C)  (Oral)   Ht 5\' 3"  (1.6 m)   Wt 173 lb 8 oz (78.7 kg)   SpO2 98%   BMI 30.73 kg/m  Wt Readings from Last 3 Encounters:  09/27/15 173 lb 8 oz (78.7 kg)  07/16/15 172 lb 9.6 oz (78.3 kg)  05/18/15 168 lb 8 oz (76.4 kg)     Lab Results  Component Value Date   WBC 9.9 09/27/2015   HGB 13.8 09/27/2015   HCT 41.2 09/27/2015   PLT 369.0 09/27/2015   GLUCOSE 121 (H) 09/27/2015   CHOL 215 (  H) 09/27/2015   TRIG 246.0 (H) 09/27/2015   HDL 34.10 (L) 09/27/2015   LDLDIRECT 135.0 09/27/2015   LDLCALC 159 (H) 02/08/2015   ALT 16 09/27/2015   AST 20 09/27/2015   NA 140 09/27/2015   K 3.8 09/27/2015   CL 105 09/27/2015   CREATININE 0.92 09/27/2015   BUN 19 09/27/2015   CO2 29 09/27/2015   TSH 3.09 09/27/2015   INR 2.7 09/15/2015   HGBA1C 6.1 09/27/2015   MICROALBUR 1.5 10/20/2014    Lab Results  Component Value Date   TSH 3.09 09/27/2015   Lab Results  Component Value Date   WBC 9.9 09/27/2015   HGB 13.8 09/27/2015   HCT 41.2 09/27/2015   MCV 90.4 09/27/2015   PLT 369.0 09/27/2015   Lab Results  Component Value Date   NA 140 09/27/2015   K 3.8 09/27/2015   CHLORIDE 109 08/12/2013   CO2 29 09/27/2015   GLUCOSE 121 (H) 09/27/2015   BUN 19 09/27/2015   CREATININE 0.92 09/27/2015   BILITOT 0.4 09/27/2015   ALKPHOS 64 09/27/2015   AST 20 09/27/2015   ALT 16 09/27/2015   PROT 7.7 09/27/2015   ALBUMIN 4.0 09/27/2015   CALCIUM 9.3 09/27/2015   ANIONGAP 13 11/26/2013   GFR 62.46 09/27/2015   Lab Results  Component Value Date   CHOL 215 (H) 09/27/2015   Lab Results  Component Value Date   HDL 34.10 (L) 09/27/2015   Lab Results  Component Value Date   LDLCALC 159 (H) 02/08/2015   Lab Results  Component Value Date   TRIG 246.0 (H) 09/27/2015   Lab Results  Component Value Date   CHOLHDL 6 09/27/2015   Lab Results  Component Value Date   HGBA1C 6.1 09/27/2015       Assessment & Plan:   Problem List Items Addressed This Visit    Hyperlipidemia     Encouraged heart healthy diet, increase exercise, avoid trans fats, consider a krill oil cap daily      Relevant Orders   CBC with Differential/Platelet (Completed)   TSH (Completed)   Lipid panel (Completed)   Essential hypertension    Well controlled, no changes to meds. Encouraged heart healthy diet such as the DASH diet and exercise as tolerated.       Relevant Orders   CBC   Comprehensive metabolic panel (Completed)   CBC with Differential/Platelet (Completed)   TSH (Completed)   Lipid panel (Completed)   Venous (peripheral) insufficiency    Encouraged compression hose. Elevate feet as needed      Obesity (BMI 30-39.9)    Encouraged DASH diet, decrease po intake and increase exercise as tolerated. Needs 7-8 hours of sleep nightly. Avoid trans fats, eat small, frequent meals every 4-5 hours with lean proteins, complex carbs and healthy fats. Minimize simple carbs      Relevant Orders   CBC with Differential/Platelet (Completed)   TSH (Completed)   Lipid panel (Completed)   Diabetes mellitus type 2 in obese (HCC)    hgba1c acceptable, minimize simple carbs. Increase exercise as tolerated. Continue current meds      Relevant Orders   Hemoglobin A1c (Completed)   CBC with Differential/Platelet (Completed)   TSH (Completed)   Lipid panel (Completed)   BCC (basal cell carcinoma), scalp/neck   Urinary tract infectious disease - Primary    Repeat urine culture negative.       Relevant Orders   Urinalysis (Completed)   Urine culture (Completed)  Other Visit Diagnoses   None.     I have discontinued Ms. Doody's ciprofloxacin. I am also having her maintain her B Complex Vitamins (VITAMIN B COMPLEX PO), ALPRAZolam, warfarin, potassium chloride SA, furosemide, and losartan.  No orders of the defined types were placed in this encounter.    Penni Homans, MD

## 2015-10-10 NOTE — Assessment & Plan Note (Signed)
Repeat urine culture negative.

## 2015-10-10 NOTE — Assessment & Plan Note (Signed)
Well controlled, no changes to meds. Encouraged heart healthy diet such as the DASH diet and exercise as tolerated.  °

## 2015-10-10 NOTE — Assessment & Plan Note (Signed)
Encouraged compression hose. Elevate feet as needed

## 2015-10-19 ENCOUNTER — Ambulatory Visit (INDEPENDENT_AMBULATORY_CARE_PROVIDER_SITE_OTHER): Payer: Medicare Other

## 2015-10-19 DIAGNOSIS — Z23 Encounter for immunization: Secondary | ICD-10-CM

## 2015-10-27 ENCOUNTER — Ambulatory Visit (INDEPENDENT_AMBULATORY_CARE_PROVIDER_SITE_OTHER): Payer: Medicare Other | Admitting: General Practice

## 2015-10-27 DIAGNOSIS — Z86718 Personal history of other venous thrombosis and embolism: Secondary | ICD-10-CM | POA: Diagnosis not present

## 2015-10-27 DIAGNOSIS — D682 Hereditary deficiency of other clotting factors: Secondary | ICD-10-CM

## 2015-10-27 DIAGNOSIS — Z5181 Encounter for therapeutic drug level monitoring: Secondary | ICD-10-CM | POA: Diagnosis not present

## 2015-10-27 LAB — POCT INR: INR: 2.6

## 2015-10-28 NOTE — Progress Notes (Signed)
I have reviewed and agree with the plan. 

## 2015-11-02 ENCOUNTER — Telehealth: Payer: Self-pay | Admitting: Internal Medicine

## 2015-11-04 NOTE — Telephone Encounter (Signed)
Left message for patient to call back  

## 2015-11-04 NOTE — Telephone Encounter (Signed)
Per Dr. Buel Ream notes on last colonoscopy in 2012.  Patient would not need any further screening colonoscopies, for symptoms only.  Patient notified.  She denies any current GI concerns.

## 2015-11-05 ENCOUNTER — Encounter: Payer: Self-pay | Admitting: Internal Medicine

## 2015-11-29 ENCOUNTER — Other Ambulatory Visit: Payer: Self-pay | Admitting: Family Medicine

## 2015-11-30 NOTE — Telephone Encounter (Signed)
Last seen 09/27/15 Last filled #[id number] rf 01/26/15 Please advise PC

## 2015-11-30 NOTE — Telephone Encounter (Signed)
Faxed hardcopy for Alprazolam to [facility]. 

## 2015-12-08 ENCOUNTER — Ambulatory Visit: Payer: Medicare Other

## 2015-12-10 ENCOUNTER — Ambulatory Visit (INDEPENDENT_AMBULATORY_CARE_PROVIDER_SITE_OTHER): Payer: Medicare Other | Admitting: General Practice

## 2015-12-10 DIAGNOSIS — Z5181 Encounter for therapeutic drug level monitoring: Secondary | ICD-10-CM

## 2015-12-10 DIAGNOSIS — D682 Hereditary deficiency of other clotting factors: Secondary | ICD-10-CM | POA: Diagnosis not present

## 2015-12-10 DIAGNOSIS — Z86718 Personal history of other venous thrombosis and embolism: Secondary | ICD-10-CM

## 2015-12-10 LAB — POCT INR: INR: 2.8

## 2015-12-10 NOTE — Progress Notes (Signed)
I have reviewed and agree with the plan. 

## 2015-12-10 NOTE — Patient Instructions (Signed)
Pre visit review using our clinic review tool, if applicable. No additional management support is needed unless otherwise documented below in the visit note. 

## 2015-12-25 ENCOUNTER — Ambulatory Visit (INDEPENDENT_AMBULATORY_CARE_PROVIDER_SITE_OTHER): Payer: Medicare Other | Admitting: Family Medicine

## 2015-12-25 ENCOUNTER — Encounter: Payer: Self-pay | Admitting: Family Medicine

## 2015-12-25 ENCOUNTER — Other Ambulatory Visit (HOSPITAL_COMMUNITY)
Admission: RE | Admit: 2015-12-25 | Discharge: 2015-12-25 | Disposition: A | Discharge status: Home / Self Care | Payer: Medicare Other | Source: Other Acute Inpatient Hospital | Attending: Family Medicine | Admitting: Family Medicine

## 2015-12-25 VITALS — BP 140/90 | HR 89 | Temp 98.3°F | Resp 18 | Ht 63.0 in | Wt 173.0 lb

## 2015-12-25 DIAGNOSIS — J029 Acute pharyngitis, unspecified: Secondary | ICD-10-CM

## 2015-12-25 LAB — POCT RAPID STREP A (OFFICE): Rapid Strep A Screen: NEGATIVE

## 2015-12-25 MED ORDER — CLINDAMYCIN HCL 300 MG PO CAPS: 300.0000 mg | ORAL_CAPSULE | Freq: Three times a day (TID) | ORAL | 0 refills | Status: DC

## 2015-12-25 NOTE — Progress Notes (Signed)
Pre visit review using our clinic review tool, if applicable. No additional management support is needed unless otherwise documented below in the visit note. 

## 2015-12-25 NOTE — Progress Notes (Addendum)
[facility] [address], [place], [place] [address] [phone]  Date:  12/25/2015   Name:  [name] Little   DOB:  September 25, 1933   MRN:  [id number]  PCP:  Penni Homans, MD    Chief Complaint: Sore Throat (burning in throat,hoarseness, cough (some) started yesterday )   History of Present Illness:  [name] Little is a 82 y.o. very pleasant female patient who presents with the following:  Here today with a ST that started yesterday.  No cough- just occasional throat clearing No fever noted No GI syptoms She does not feel that bad overall but does have a significantly sore throat She is not aware of any sick contacts at home She has tried some salt water gargle, no other medications so far She is on coumadin for factor V leiden mutation.  She has her INR checked regularly and it has been in range Her next visit with Dr. Charlett Blake is this coming week Lab Results  Component Value Date   INR 2.8 12/10/2015   INR 2.6 10/27/2015   INR 2.7 09/15/2015   PROTIME 23.1 07/14/2008  no history of C diff  Patient Active Problem List   Diagnosis Date Noted  . Urinary tract infectious disease 10/10/2015  . BCC (basal cell carcinoma), scalp/neck 09/27/2015  . Pain in limb 05/30/2015  . Low back pain 03/26/2014  . Diabetes mellitus type 2 in obese ([place]) 02/26/2014  . Nocturia 02/26/2014  . Obesity (BMI 30-39.9) 02/03/2014  . Anxiety state 09/22/2013  . FH: factor V Leiden mutation 08/19/2013  . Healthcare maintenance 04/02/2013  . Encounter for therapeutic drug monitoring 03/26/2013  . Anticoagulant long-term use 08/23/2010  . CALLUS, TOE 01/05/2010  . PALPITATIONS 08/23/2009  . CHEST PAIN 08/23/2009  . hx: breast cancer, IDC, right LOQ, receptor + her 2 - 07/27/2009  . FACTOR V DEFICIENCY 07/27/2009  . PHLEBITIS&THROMBOPHLEB SUP VESSELS LOWER EXTREM 12/11/2007  . HEMORRHOIDS 12/09/2007  . COLONIC POLYPS, HX OF 12/09/2007   . Essential hypertension 09/18/2007  . Venous (peripheral) insufficiency 07/02/2007  . Hyperlipidemia 06/04/2007  . PAROXYSMAL SUPRAVENTRICULAR TACHYCARDIA 03/28/2007  . ANEURYSM, THORACIC AORTIC 11/25/2006  . DVT, HX OF 08/11/2006    Past Medical History:  Diagnosis Date  . Anxiety state, unspecified 09/22/2013  . Arthritis    lumbar stenosis, radiculopathy, OA- L hip  . BCC (basal cell carcinoma), scalp/neck 09/27/2015  . Blood clotting disorder (HCC)    V5 factor  . Blood dyscrasia    factor 5 deficiency  . Clostridium difficile infection   . Clotting disorder ([place])   . Diabetes mellitus type 2 in obese ([place]) 02/26/2014  . DVT (deep venous thrombosis) ([place])   . Factor V deficiency ([place])   . Hemorrhoids   . Hyperglycemia 02/26/2014  . Hyperlipemia   . Hypertension   . Infiltrating ductal carcinoma of breast ([place])    stage one right   . Low back pain 03/26/2014  . Macular degeneration   . Mixed hyperlipidemia 05/18/2015  . Nocturia 02/26/2014  . Paroxysmal supraventricular tachycardia ([place])   . Personal history of colonic polyps    hyperplastic  . Thoracic aortic aneurysm ([place])   . TMJ pain dysfunction syndrome     Past Surgical History:  Procedure Laterality Date  . ABDOMINAL HYSTERECTOMY     1960- ? vaginal hysterectomy, not laparoscopic   . BREAST LUMPECTOMY  2010   right Dr. [name]  . COLONOSCOPY    .  EYE SURGERY     bilateral cataracts- /w IOL   . JOINT REPLACEMENT     R hip replacement   . LAPAROSCOPIC HYSTERECTOMY    . LUMBAR LAMINECTOMY/DECOMPRESSION MICRODISCECTOMY  04/11/2011   Procedure: LUMBAR LAMINECTOMY/DECOMPRESSION MICRODISCECTOMY;  Surgeon: [name], MD;  Location: [facility];  Service: Neurosurgery;  Laterality: N/A;  Lumbar Five-Sacral One Microdiscectomy  . right calf surgery     for cancer  . SPLENECTOMY    . TOTAL HIP ARTHROPLASTY  1999   right  . UPPER GASTROINTESTINAL ENDOSCOPY      Social History  Substance Use Topics  .  Smoking status: Never Smoker  . Smokeless tobacco: Never Used  . Alcohol use No    Family History  Problem Relation Age of Onset  . Heart disease Mother   . Heart disease Father   . Heart disease      uncles  . Clotting disorder Maternal Uncle   . Breast cancer Paternal Aunt   . Cancer Paternal Aunt     breast  . Colon cancer Neg Hx   . Anesthesia problems Neg Hx   . Hypotension Neg Hx   . Malignant hyperthermia Neg Hx   . Pseudochol deficiency Neg Hx     Allergies  Allergen Reactions  . Amlodipine Swelling    "my whole body swelled"  . Carvedilol Swelling    "my whole body swelled"  . Cefuroxime Axetil     REACTION: Ddoes not remember reaction  . Statins     Weakness, pain Lipitor and Pravachol  . Sulfonamide Derivatives     REACTION: Rash    Medication list has been reviewed and updated.  Current Outpatient Prescriptions on File Prior to Visit  Medication Sig Dispense Refill  . ALPRAZolam (XANAX) 0.25 MG tablet TAKE ONE TABLET BY MOUTH TWICE DIALY AS NEEDED FOR ANXIETY 10 tablet 1  . B Complex Vitamins (VITAMIN B COMPLEX PO) Take by mouth.    . furosemide (LASIX) 20 MG tablet Take 1 tablet (20 mg total) by mouth daily as needed for fluid or edema. 30 tablet 1  . losartan (COZAAR) 100 MG tablet Take 1 tablet (100 mg total) by mouth daily. 90 tablet 1  . potassium chloride SA (K-DUR,KLOR-CON) 20 MEQ tablet Take 1 tablet (20 mEq total) by mouth daily. 30 tablet 3  . warfarin (COUMADIN) 5 MG tablet TAKE AS DIRECTED BY COUMADIN CLINIC. 90 tablet 3   No current facility-administered medications on file prior to visit.     Review of Systems:  As per HPI- otherwise negative.   Physical Examination: Vitals:   12/25/15 0921  BP: 140/90  Pulse: 89  Resp: 18  Temp: 98.3 F (36.8 C)   Vitals:   12/25/15 0921  Weight: 173 lb (78.5 kg)  Height: 5\' 3"  (1.6 m)   Body mass index is 30.65 kg/m. Ideal Body Weight: Weight in (lb) to have BMI = 25: 140.8  GEN:  WDWN, NAD, Non-toxic, A & O x 3, looks well HEENT: Atraumatic, Normocephalic. Neck supple. No masses, No LAD.  Bilateral TM wnl, oropharynx erythematous with mild exudate on left only. PEERL,EOMI.   Ears and Nose: No external deformity. CV: RRR, No M/G/R. No JVD. No thrill. No extra heart sounds. PULM: CTA B, no wheezes, crackles, rhonchi. No retractions. No resp. distress. No accessory muscle use. EXTR: No c/c/e NEURO Normal gait.  PSYCH: Normally interactive. Conversant. Not depressed or anxious appearing.  Calm demeanor.   Results for orders  placed or performed in visit on 12/25/15  POCT rapid strep A  Result Value Ref Range   Rapid Strep A Screen Negative Negative    Assessment and Plan: Sore throat - Plan: POCT rapid strep A, Culture, Group A Strep, clindamycin (CLEOCIN) 300 MG capsule  Here today with sore throat.  Her rapid strep is negative.   Offered to defer abx while culture pending vs start abx now.  She would prefer to start abx now, can stop if culture is negative.  She has an unknown reaction to cephalosporins; will treat with clindamycin as below Will plan further follow- up pending labs.  Encouraged probiotics to help prevent diarrhea Meds ordered this encounter  Medications  . clindamycin (CLEOCIN) 300 MG capsule    Sig: Take 1 capsule (300 mg total) by mouth 3 (three) times daily.    Dispense:  30 capsule    Refill:  0    Signed Lamar Blinks, MD Ok to [facility] with culture results and instructions   Received throat culture 11/28- she has noted a lot of improvement on the abx but her culture is negative.  Advised that she can take the abx for 5-7 days and then stop if she would like

## 2015-12-25 NOTE — Patient Instructions (Signed)
We are going to treat you for presumed strep throat with clindamycin- take it three times a day for 10 days.  If your throat culture is negative you can stop using it!  I will call you and let you know If you are getting worse or have any other concerns in the meantime please let me know I would also recommend that you use yogurt or an OTC probiotic to help prevent diarrhea while you are on the antibiotic

## 2015-12-28 ENCOUNTER — Encounter: Payer: Self-pay | Admitting: Family Medicine

## 2015-12-28 ENCOUNTER — Ambulatory Visit (INDEPENDENT_AMBULATORY_CARE_PROVIDER_SITE_OTHER): Payer: Medicare Other | Admitting: Family Medicine

## 2015-12-28 DIAGNOSIS — I10 Essential (primary) hypertension: Secondary | ICD-10-CM | POA: Diagnosis not present

## 2015-12-28 DIAGNOSIS — E669 Obesity, unspecified: Secondary | ICD-10-CM

## 2015-12-28 DIAGNOSIS — J029 Acute pharyngitis, unspecified: Secondary | ICD-10-CM

## 2015-12-28 DIAGNOSIS — E1169 Type 2 diabetes mellitus with other specified complication: Secondary | ICD-10-CM

## 2015-12-28 DIAGNOSIS — E782 Mixed hyperlipidemia: Secondary | ICD-10-CM

## 2015-12-28 DIAGNOSIS — E119 Type 2 diabetes mellitus without complications: Secondary | ICD-10-CM

## 2015-12-28 DIAGNOSIS — Z7901 Long term (current) use of anticoagulants: Secondary | ICD-10-CM | POA: Diagnosis not present

## 2015-12-28 LAB — CULTURE, GROUP A STREP (THRC)

## 2015-12-28 MED ORDER — POTASSIUM CHLORIDE CRYS ER 20 MEQ PO TBCR: 20.0000 meq | EXTENDED_RELEASE_TABLET | Freq: Every day | ORAL | 0 refills | Status: DC | PRN

## 2015-12-28 NOTE — Assessment & Plan Note (Signed)
hgba1c acceptable, minimize simple carbs. Increase exercise as tolerated. Continue current meds 

## 2015-12-28 NOTE — Progress Notes (Signed)
Pre visit review using our clinic review tool, if applicable. No additional management support is needed unless otherwise documented below in the visit note. 

## 2015-12-28 NOTE — Assessment & Plan Note (Signed)
Well controlled, no changes to meds. Encouraged heart healthy diet such as the DASH diet and exercise as tolerated.  °

## 2015-12-28 NOTE — Assessment & Plan Note (Signed)
Tolerating Coumadin, no concerns of bleeding. Check INR next week

## 2015-12-28 NOTE — Assessment & Plan Note (Signed)
Encouraged DASH diet, decrease po intake and increase exercise as tolerated. Needs 7-8 hours of sleep nightly. Avoid trans fats, eat small, frequent meals every 4-5 hours with lean proteins, complex carbs and healthy fats. Minimize simple carbs 

## 2015-12-28 NOTE — Assessment & Plan Note (Signed)
Encouraged heart healthy diet, increase exercise, avoid trans fats, consider a krill oil cap daily 

## 2015-12-28 NOTE — Progress Notes (Signed)
Patient ID: [name] Little, female   DOB: 1933/05/29, 82 y.o.   MRN: [id number]   Subjective:    Patient ID: [name] Little, female    DOB: 12/28/1933, 82 y.o.   MRN: [id number]  Chief Complaint  Patient presents with  . Follow-up    HPI Patient is in today for follow up. She is feeling much better after being seen at walk in with severe sore throat. Her throat is much better after just a couple doses of Clindamycin. No GI upset. No fevers. Denies CP/palp/SOB/HA/congestion/fevers/GI or GU c/o. Taking meds as prescribed. Notes ongoing fatigue  Past Medical History:  Diagnosis Date  . Anxiety state, unspecified 09/22/2013  . Arthritis    lumbar stenosis, radiculopathy, OA- L hip  . BCC (basal cell carcinoma), scalp/neck 09/27/2015  . Blood clotting disorder (HCC)    V5 factor  . Blood dyscrasia    factor 5 deficiency  . Clostridium difficile infection   . Clotting disorder ([place])   . Diabetes mellitus type 2 in obese ([place]) 02/26/2014  . DVT (deep venous thrombosis) ([place])   . Factor V deficiency ([place])   . Hemorrhoids   . Hyperglycemia 02/26/2014  . Hyperlipemia   . Hypertension   . Infiltrating ductal carcinoma of breast ([place])    stage one right   . Low back pain 03/26/2014  . Macular degeneration   . Mixed hyperlipidemia 05/18/2015  . Nocturia 02/26/2014  . Paroxysmal supraventricular tachycardia ([place])   . Personal history of colonic polyps    hyperplastic  . Thoracic aortic aneurysm ([place])   . TMJ pain dysfunction syndrome     Past Surgical History:  Procedure Laterality Date  . ABDOMINAL HYSTERECTOMY     1960- ? vaginal hysterectomy, not laparoscopic   . BREAST LUMPECTOMY  2010   right Dr. [name]  . COLONOSCOPY    . EYE SURGERY     bilateral cataracts- /w IOL   . JOINT REPLACEMENT     R hip replacement   . LAPAROSCOPIC HYSTERECTOMY    . LUMBAR LAMINECTOMY/DECOMPRESSION MICRODISCECTOMY  04/11/2011   Procedure: LUMBAR LAMINECTOMY/DECOMPRESSION MICRODISCECTOMY;  Surgeon:  [name], MD;  Location: [facility];  Service: Neurosurgery;  Laterality: N/A;  Lumbar Five-Sacral One Microdiscectomy  . right calf surgery     for cancer  . SPLENECTOMY    . TOTAL HIP ARTHROPLASTY  1999   right  . UPPER GASTROINTESTINAL ENDOSCOPY      Family History  Problem Relation Age of Onset  . Heart disease Mother   . Heart disease Father   . Heart disease      uncles  . Clotting disorder Maternal Uncle   . Breast cancer Paternal Aunt   . Cancer Paternal Aunt     breast  . Colon cancer Neg Hx   . Anesthesia problems Neg Hx   . Hypotension Neg Hx   . Malignant hyperthermia Neg Hx   . Pseudochol deficiency Neg Hx     Social History   Social History  . Marital status: Married    Spouse name: N/A  . Number of children: 4  . Years of education: N/A   Occupational History  . Retired    Social History Main Topics  . Smoking status: Never Smoker  . Smokeless tobacco: Never Used  . Alcohol use No  . Drug use: No  . Sexual activity: Yes    Partners: Male   Other Topics Concern  . Not on file   Social  History Narrative   Married 1955   2 sons - '59, '61, 2 daughters '55, '57; 8 grandchildren; 1 great grand   I-ADLs          Outpatient Medications Prior to Visit  Medication Sig Dispense Refill  . ALPRAZolam (XANAX) 0.25 MG tablet TAKE ONE TABLET BY MOUTH TWICE DIALY AS NEEDED FOR ANXIETY 10 tablet 1  . B Complex Vitamins (VITAMIN B COMPLEX PO) Take by mouth.    . clindamycin (CLEOCIN) 300 MG capsule Take 1 capsule (300 mg total) by mouth 3 (three) times daily. 30 capsule 0  . furosemide (LASIX) 20 MG tablet Take 1 tablet (20 mg total) by mouth daily as needed for fluid or edema. 30 tablet 1  . losartan (COZAAR) 100 MG tablet Take 1 tablet (100 mg total) by mouth daily. 90 tablet 1  . warfarin (COUMADIN) 5 MG tablet TAKE AS DIRECTED BY COUMADIN CLINIC. 90 tablet 3  . potassium chloride SA (K-DUR,KLOR-CON) 20 MEQ tablet Take 1 tablet (20 mEq total) by  mouth daily. 30 tablet 3   No facility-administered medications prior to visit.     Allergies  Allergen Reactions  . Amlodipine Swelling    "my whole body swelled"  . Carvedilol Swelling    "my whole body swelled"  . Cefuroxime Axetil     REACTION: Ddoes not remember reaction  . Statins     Weakness, pain Lipitor and Pravachol  . Sulfonamide Derivatives     REACTION: Rash    Review of Systems  Constitutional: Positive for malaise/fatigue. Negative for fever.  HENT: Positive for sore throat. Negative for congestion.   Eyes: Negative for blurred vision.  Respiratory: Negative for shortness of breath.   Cardiovascular: Negative for chest pain, palpitations and leg swelling.  Gastrointestinal: Negative for abdominal pain, blood in stool and nausea.  Genitourinary: Negative for dysuria and frequency.  Musculoskeletal: Negative for falls.  Skin: Negative for rash.  Neurological: Negative for dizziness, loss of consciousness and headaches.  Endo/Heme/Allergies: Negative for environmental allergies.  Psychiatric/Behavioral: Negative for depression. The patient is not nervous/anxious.        Objective:    Physical Exam  BP 132/86 (BP Location: Left Arm, Patient Position: Sitting, Cuff Size: Normal)   Pulse 67   Temp 98 F (36.7 C) (Oral)   Ht 5\' 3"  (1.6 m)   Wt 174 lb 4 oz (79 kg)   SpO2 98%   BMI 30.87 kg/m  Wt Readings from Last 3 Encounters:  12/28/15 174 lb 4 oz (79 kg)  12/25/15 173 lb (78.5 kg)  09/27/15 173 lb 8 oz (78.7 kg)     Lab Results  Component Value Date   WBC 9.9 09/27/2015   HGB 13.8 09/27/2015   HCT 41.2 09/27/2015   PLT 369.0 09/27/2015   GLUCOSE 121 (H) 09/27/2015   CHOL 215 (H) 09/27/2015   TRIG 246.0 (H) 09/27/2015   HDL 34.10 (L) 09/27/2015   LDLDIRECT 135.0 09/27/2015   LDLCALC 159 (H) 02/08/2015   ALT 16 09/27/2015   AST 20 09/27/2015   NA 140 09/27/2015   K 3.8 09/27/2015   CL 105 09/27/2015   CREATININE 0.92 09/27/2015   BUN  19 09/27/2015   CO2 29 09/27/2015   TSH 3.09 09/27/2015   INR 2.8 12/10/2015   HGBA1C 6.1 09/27/2015   MICROALBUR 1.5 10/20/2014    Lab Results  Component Value Date   TSH 3.09 09/27/2015   Lab Results  Component Value Date  WBC 9.9 09/27/2015   HGB 13.8 09/27/2015   HCT 41.2 09/27/2015   MCV 90.4 09/27/2015   PLT 369.0 09/27/2015   Lab Results  Component Value Date   NA 140 09/27/2015   K 3.8 09/27/2015   CHLORIDE 109 08/12/2013   CO2 29 09/27/2015   GLUCOSE 121 (H) 09/27/2015   BUN 19 09/27/2015   CREATININE 0.92 09/27/2015   BILITOT 0.4 09/27/2015   ALKPHOS 64 09/27/2015   AST 20 09/27/2015   ALT 16 09/27/2015   PROT 7.7 09/27/2015   ALBUMIN 4.0 09/27/2015   CALCIUM 9.3 09/27/2015   ANIONGAP 13 11/26/2013   GFR 62.46 09/27/2015   Lab Results  Component Value Date   CHOL 215 (H) 09/27/2015   Lab Results  Component Value Date   HDL 34.10 (L) 09/27/2015   Lab Results  Component Value Date   LDLCALC 159 (H) 02/08/2015   Lab Results  Component Value Date   TRIG 246.0 (H) 09/27/2015   Lab Results  Component Value Date   CHOLHDL 6 09/27/2015   Lab Results  Component Value Date   HGBA1C 6.1 09/27/2015       Assessment & Plan:   Problem List Items Addressed This Visit    Hyperlipidemia    Encouraged heart healthy diet, increase exercise, avoid trans fats, consider a krill oil cap daily      Relevant Orders   Lipid panel   Essential hypertension    Well controlled, no changes to meds. Encouraged heart healthy diet such as the DASH diet and exercise as tolerated.       Relevant Orders   CBC   Comprehensive metabolic panel   TSH   Anticoagulant long-term use    Tolerating Coumadin, no concerns of bleeding. Check INR next week      Relevant Orders   INR/PT   Obesity (BMI 30-39.9)    Encouraged DASH diet, decrease po intake and increase exercise as tolerated. Needs 7-8 hours of sleep nightly. Avoid trans fats, eat small, frequent meals  every 4-5 hours with lean proteins, complex carbs and healthy fats. Minimize simple carbs      Diabetes mellitus type 2 in obese (HCC)    hgba1c acceptable, minimize simple carbs. Increase exercise as tolerated. Continue current meds      Relevant Orders   Hemoglobin A1c   Acute pharyngitis    Strep neg but had a good response to Clindamycin, encouraged to take 7 days worth and start a probiotic.         I have discontinued Ms. Kliebert's potassium chloride SA. I have also changed her potassium chloride SA. Additionally, I am having her maintain her B Complex Vitamins (VITAMIN B COMPLEX PO), warfarin, furosemide, losartan, ALPRAZolam, and clindamycin.  Meds ordered this encounter  Medications  . DISCONTD: potassium chloride SA (K-DUR,KLOR-CON) 20 MEQ tablet    Sig: Take 1 tablet (20 mEq total) by mouth daily as needed.    Dispense:  30 tablet    Refill:  0  . potassium chloride SA (K-DUR,KLOR-CON) 20 MEQ tablet    Sig: Take 1 tablet (20 mEq total) by mouth daily as needed (lasix use).    Dispense:  30 tablet    Refill:  0     Penni Homans, MD

## 2015-12-28 NOTE — Assessment & Plan Note (Signed)
Strep neg but had a good response to Clindamycin, encouraged to take 7 days worth and start a probiotic.

## 2015-12-28 NOTE — Patient Instructions (Addendum)
NOW probiotics, 1 cap daily, Dr Frederik Pear probiotic [facility] or [employer] at pharmacy  Get labs drawn at [facility], [facility], [facility] lab on or after next Wed, December 6. Stop potassium and only take when you need a lasix  Take the Clindamycin for 7 days   Pharyngitis Pharyngitis is redness, pain, and swelling (inflammation) of your pharynx. What are the causes? Pharyngitis is usually caused by infection. Most of the time, these infections are from viruses (viral) and are part of a cold. However, sometimes pharyngitis is caused by bacteria (bacterial). Pharyngitis can also be caused by allergies. Viral pharyngitis may be spread from person to person by coughing, sneezing, and personal items or utensils (cups, forks, spoons, toothbrushes). Bacterial pharyngitis may be spread from person to person by more intimate contact, such as kissing. What are the signs or symptoms? Symptoms of pharyngitis include:  Sore throat.  Tiredness (fatigue).  Low-grade fever.  Headache.  Joint pain and muscle aches.  Skin rashes.  Swollen lymph nodes.  Plaque-like film on throat or tonsils (often seen with bacterial pharyngitis). How is this diagnosed? Your health care provider will ask you questions about your illness and your symptoms. Your medical history, along with a physical exam, is often all that is needed to diagnose pharyngitis. Sometimes, a rapid strep test is done. Other lab tests may also be done, depending on the suspected cause. How is this treated? Viral pharyngitis will usually get better in 3-4 days without the use of medicine. Bacterial pharyngitis is treated with medicines that kill germs (antibiotics). Follow these instructions at home:  Drink enough water and fluids to keep your urine clear or pale yellow.  Only take over-the-counter or prescription medicines as directed by your health care provider:  If you are prescribed antibiotics, make sure you finish them even if you  start to feel better.  Do not take aspirin.  Get lots of rest.  Gargle with 8 oz of salt water ( tsp of salt per 1 qt of water) as often as every 1-2 hours to soothe your throat.  Throat lozenges (if you are not at risk for choking) or sprays may be used to soothe your throat. Contact a health care provider if:  You have large, tender lumps in your neck.  You have a rash.  You cough up green, yellow-brown, or bloody spit. Get help right away if:  Your neck becomes stiff.  You drool or are unable to swallow liquids.  You vomit or are unable to keep medicines or liquids down.  You have severe pain that does not go away with the use of recommended medicines.  You have trouble breathing (not caused by a stuffy nose). This information is not intended to replace advice given to you by your health care provider. Make sure you discuss any questions you have with your health care provider. Document Released: 01/16/2005 Document Revised: 06/24/2015 Document Reviewed: 09/23/2012 Elsevier Interactive Patient Education  2017 [employer].

## 2015-12-31 ENCOUNTER — Other Ambulatory Visit (INDEPENDENT_AMBULATORY_CARE_PROVIDER_SITE_OTHER): Payer: Medicare Other

## 2015-12-31 DIAGNOSIS — Z7901 Long term (current) use of anticoagulants: Secondary | ICD-10-CM | POA: Diagnosis not present

## 2015-12-31 DIAGNOSIS — E1169 Type 2 diabetes mellitus with other specified complication: Secondary | ICD-10-CM | POA: Diagnosis not present

## 2015-12-31 DIAGNOSIS — E669 Obesity, unspecified: Secondary | ICD-10-CM | POA: Diagnosis not present

## 2015-12-31 DIAGNOSIS — I10 Essential (primary) hypertension: Secondary | ICD-10-CM

## 2015-12-31 DIAGNOSIS — E782 Mixed hyperlipidemia: Secondary | ICD-10-CM

## 2015-12-31 LAB — PROTIME-INR
INR: 2.3 ratio — ABNORMAL HIGH (ref 0.8–1.0)
Prothrombin Time: 24.8 s — ABNORMAL HIGH (ref 9.6–13.1)

## 2015-12-31 LAB — COMPREHENSIVE METABOLIC PANEL
ALT: 17 U/L (ref 0–35)
AST: 25 U/L (ref 0–37)
Albumin: 3.8 g/dL (ref 3.5–5.2)
Alkaline Phosphatase: 67 U/L (ref 39–117)
BUN: 16 mg/dL (ref 6–23)
CO2: 26 mEq/L (ref 19–32)
Calcium: 9.4 mg/dL (ref 8.4–10.5)
Chloride: 106 mEq/L (ref 96–112)
Creatinine, Ser: 0.8 mg/dL (ref 0.40–1.20)
GFR: 73.35 mL/min (ref >60.00)
Glucose, Bld: 93 mg/dL (ref 70–99)
Potassium: 4.9 mEq/L (ref 3.5–5.1)
Sodium: 142 mEq/L (ref 135–145)
Total Bilirubin: 0.6 mg/dL (ref 0.2–1.2)
Total Protein: 7.5 g/dL (ref 6.0–8.3)

## 2015-12-31 LAB — TSH: TSH: 3.4 u[IU]/mL (ref 0.35–4.50)

## 2015-12-31 LAB — CBC
HCT: 42.2 % (ref 36.0–46.0)
Hemoglobin: 13.9 g/dL (ref 12.0–15.0)
MCHC: 33 g/dL (ref 30.0–36.0)
MCV: 89.2 fl (ref 78.0–100.0)
Platelets: 333 10*3/uL (ref 150.0–400.0)
RBC: 4.73 Mil/uL (ref 3.87–5.11)
RDW: 15.1 % (ref 11.5–15.5)
WBC: 9.6 10*3/uL (ref 4.0–10.5)

## 2015-12-31 LAB — HEMOGLOBIN A1C: Hgb A1c MFr Bld: 6.5 % (ref 4.6–6.5)

## 2015-12-31 LAB — LIPID PANEL
Cholesterol: 226 mg/dL — ABNORMAL HIGH (ref 0–200)
HDL: 34.7 mg/dL — ABNORMAL LOW (ref >39.00)
NonHDL: 191.46
Total CHOL/HDL Ratio: 7
Triglycerides: 220 mg/dL — ABNORMAL HIGH (ref 0.0–149.0)
VLDL: 44 mg/dL — ABNORMAL HIGH (ref 0.0–40.0)

## 2015-12-31 LAB — LDL CHOLESTEROL, DIRECT: Direct LDL: 133 mg/dL

## 2016-01-03 ENCOUNTER — Ambulatory Visit (INDEPENDENT_AMBULATORY_CARE_PROVIDER_SITE_OTHER): Payer: Medicare Other | Admitting: General Practice

## 2016-01-03 DIAGNOSIS — Z5181 Encounter for therapeutic drug level monitoring: Secondary | ICD-10-CM

## 2016-01-03 DIAGNOSIS — Z86718 Personal history of other venous thrombosis and embolism: Secondary | ICD-10-CM

## 2016-01-03 NOTE — Patient Instructions (Signed)
Pre visit review using our clinic review tool, if applicable. No additional management support is needed unless otherwise documented below in the visit note. 

## 2016-01-03 NOTE — Progress Notes (Signed)
I agree with this plan.

## 2016-01-10 ENCOUNTER — Ambulatory Visit: Payer: Medicare Other | Admitting: Internal Medicine

## 2016-01-10 ENCOUNTER — Encounter: Payer: Self-pay | Admitting: Internal Medicine

## 2016-01-21 ENCOUNTER — Ambulatory Visit: Payer: Medicare Other

## 2016-02-11 ENCOUNTER — Ambulatory Visit (INDEPENDENT_AMBULATORY_CARE_PROVIDER_SITE_OTHER): Payer: Medicare Other | Admitting: General Practice

## 2016-02-11 DIAGNOSIS — D682 Hereditary deficiency of other clotting factors: Secondary | ICD-10-CM

## 2016-02-11 DIAGNOSIS — Z5181 Encounter for therapeutic drug level monitoring: Secondary | ICD-10-CM

## 2016-02-11 DIAGNOSIS — Z86718 Personal history of other venous thrombosis and embolism: Secondary | ICD-10-CM

## 2016-02-11 LAB — POCT INR: INR: 2.3

## 2016-02-11 NOTE — Patient Instructions (Signed)
Pre visit review using our clinic review tool, if applicable. No additional management support is needed unless otherwise documented below in the visit note. 

## 2016-02-11 NOTE — Progress Notes (Signed)
I have reviewed and agree with the plan. 

## 2016-03-02 ENCOUNTER — Other Ambulatory Visit: Payer: Self-pay | Admitting: Family Medicine

## 2016-03-02 DIAGNOSIS — I10 Essential (primary) hypertension: Secondary | ICD-10-CM

## 2016-03-06 ENCOUNTER — Ambulatory Visit: Payer: Medicare Other | Admitting: Internal Medicine

## 2016-03-23 ENCOUNTER — Other Ambulatory Visit: Payer: Self-pay | Admitting: Family Medicine

## 2016-03-31 ENCOUNTER — Ambulatory Visit (INDEPENDENT_AMBULATORY_CARE_PROVIDER_SITE_OTHER): Payer: Medicare Other | Admitting: General Practice

## 2016-03-31 DIAGNOSIS — Z5181 Encounter for therapeutic drug level monitoring: Secondary | ICD-10-CM

## 2016-03-31 DIAGNOSIS — Z86718 Personal history of other venous thrombosis and embolism: Secondary | ICD-10-CM | POA: Diagnosis not present

## 2016-03-31 LAB — POCT INR: INR: 3

## 2016-03-31 NOTE — Progress Notes (Signed)
I have reviewed and agree with the plan. 

## 2016-03-31 NOTE — Patient Instructions (Signed)
Pre visit review using our clinic review tool, if applicable. No additional management support is needed unless otherwise documented below in the visit note. 

## 2016-04-14 NOTE — Progress Notes (Signed)
Pre visit review using our clinic review tool, if applicable. No additional management support is needed unless otherwise documented below in the visit note. 

## 2016-04-14 NOTE — Progress Notes (Signed)
Subjective:   [name] Little is a 83 y.o. female who presents for Medicare Annual (Subsequent) preventive examination.  Review of Systems:  No ROS.  Medicare Wellness Visit. Cardiac Risk Factors include: advanced age (>88men, >26 women);diabetes mellitus;dyslipidemia;hypertension Sleep patterns: Wake to urinate once or twice. Sleeps about 6 hrs per night. Occasional naps. Home Safety/Smoke Alarms:  Feels safe in home. Smoke alarms in place.  Living environment; residence and Firearm Safety: Lives with husband. Guns safely stored. Seat Belt Safety/Bike Helmet: Wears seat belt.   Counseling:   Eye Exam-Wears glasses. Dr.Shapiro annually.  Dental- Dr.Blackwell as needed.  Female:   Pap-  n/a due to age     28- Last 08/16/15: BI-RADS CATEGORY  2: Benign.      Dexa scan- ORDERED TODAY.        CCS- Last 12/25/07: diverticulosis. No f/u on file.       Objective:     Vitals: BP (!) 168/78 (BP Location: Left Arm, Patient Position: Sitting, Cuff Size: Large)   Pulse 62   Ht 5\' 3"  (1.6 m)   Wt 175 lb (79.4 kg)   SpO2 97%   BMI 31.00 kg/m   Body mass index is 31 kg/m.   Tobacco History  Smoking Status  . Never Smoker  Smokeless Tobacco  . Never Used     Counseling given: No   Past Medical History:  Diagnosis Date  . Anxiety state, unspecified 09/22/2013  . Arthritis    lumbar stenosis, radiculopathy, OA- L hip  . BCC (basal cell carcinoma), scalp/neck 09/27/2015  . Blood clotting disorder (HCC)    V5 factor  . Blood dyscrasia    factor 5 deficiency  . Clostridium difficile infection   . Clotting disorder ([place])   . Diabetes mellitus type 2 in obese ([place]) 02/26/2014  . DVT (deep venous thrombosis) ([place])   . Factor V deficiency ([place])   . Hemorrhoids   . Hyperglycemia 02/26/2014  . Hyperlipemia   . Hypertension   . Infiltrating ductal carcinoma of breast ([place])    stage one right   . Low back pain 03/26/2014  . Macular degeneration   . Mixed hyperlipidemia  05/18/2015  . Nocturia 02/26/2014  . Paroxysmal supraventricular tachycardia ([place])   . Personal history of colonic polyps    hyperplastic  . Thoracic aortic aneurysm ([place])   . TMJ pain dysfunction syndrome    Past Surgical History:  Procedure Laterality Date  . ABDOMINAL HYSTERECTOMY     1960- ? vaginal hysterectomy, not laparoscopic   . BREAST LUMPECTOMY  2010   right Dr. [name]  . COLONOSCOPY    . EYE SURGERY     bilateral cataracts- /w IOL   . JOINT REPLACEMENT     R hip replacement   . LAPAROSCOPIC HYSTERECTOMY    . LUMBAR LAMINECTOMY/DECOMPRESSION MICRODISCECTOMY  04/11/2011   Procedure: LUMBAR LAMINECTOMY/DECOMPRESSION MICRODISCECTOMY;  Surgeon: [name], MD;  Location: [facility];  Service: Neurosurgery;  Laterality: N/A;  Lumbar Five-Sacral One Microdiscectomy  . right calf surgery     for cancer  . SPLENECTOMY    . TOTAL HIP ARTHROPLASTY  1999   right  . UPPER GASTROINTESTINAL ENDOSCOPY     Family History  Problem Relation Age of Onset  . Heart disease Mother   . Heart disease Father   . Alzheimer's disease Sister   . Heart disease      uncles  . Clotting disorder Maternal Uncle   . Breast cancer Paternal Aunt   .  Cancer Paternal Aunt     breast  . Colon cancer Neg Hx   . Anesthesia problems Neg Hx   . Hypotension Neg Hx   . Malignant hyperthermia Neg Hx   . Pseudochol deficiency Neg Hx    History  Sexual Activity  . Sexual activity: No    Outpatient Encounter Prescriptions as of 04/17/2016  Medication Sig  . ALPRAZolam (XANAX) 0.25 MG tablet TAKE ONE TABLET BY MOUTH TWICE DIALY AS NEEDED FOR ANXIETY  . B Complex Vitamins (VITAMIN B COMPLEX PO) Take by mouth.  . losartan (COZAAR) 100 MG tablet TAKE 1 TABLET (100 MG TOTAL) BY MOUTH DAILY.  Marland Kitchen warfarin (COUMADIN) 5 MG tablet TAKE AS DIRECTED BY COUMADIN CLINIC.  . furosemide (LASIX) 20 MG tablet Take 1 tablet (20 mg total) by mouth daily as needed for fluid or edema. (Patient not taking: Reported on  04/17/2016)  . potassium chloride SA (K-DUR,KLOR-CON) 20 MEQ tablet Take 1 tablet (20 mEq total) by mouth daily as needed (lasix use). (Patient not taking: Reported on 04/17/2016)  . [DISCONTINUED] clindamycin (CLEOCIN) 300 MG capsule Take 1 capsule (300 mg total) by mouth 3 (three) times daily.   No facility-administered encounter medications on file as of 04/17/2016.     Activities of Daily Living In your present state of health, do you have any difficulty performing the following activities: 04/17/2016 09/27/2015  Hearing? N N  Vision? N N  Difficulty concentrating or making decisions? N N  Walking or climbing stairs? N N  Dressing or bathing? N N  Doing errands, shopping? N N  Preparing Food and eating ? N -  Using the Toilet? N -  In the past six months, have you accidently leaked urine? Y -  Do you have problems with loss of bowel control? N -  Managing your Medications? N -  Managing your Finances? N -  Housekeeping or managing your Housekeeping? N -  Some recent data might be hidden    Patient Care Team: Mosie Lukes, MD as PCP - General (Family Medicine) Hurman Horn, MD as Consulting Physician (Ophthalmology)    Assessment:    Physical assessment deferred to PCP.  Exercise Activities and Dietary recommendations Current Exercise Habits: The patient does not participate in regular exercise at present, Exercise limited by: None identified   Diet (meal preparation, eat out, water intake, caffeinated beverages, dairy products, fruits and vegetables): in general, a "healthy" diet   Breakfast: Cheerios and blueberries.water. Lunch: Salad or toast with PB. Water. Dinner: Best boy, vegetable, beans. Water. Drinks at least 5 bottles of water per day.  Goals      Patient Stated   . <enter goal here> (pt-stated)          Lose 20lb with diet and exercise.      Other   . Increase physical activity          Increase walking--walk in neighborhood park       Fall  Risk Fall Risk  04/17/2016 07/16/2015 08/17/2014 07/13/2014 06/09/2013  Falls in the past year? Yes No No No No  Number falls in past yr: 1 - - - -  Injury with Fall? No - - - -  Follow up Education provided;Falls prevention discussed - - - -   Depression Screen PHQ 2/9 Scores 04/17/2016 07/16/2015 08/17/2014 07/13/2014  PHQ - 2 Score 0 0 0 0     Cognitive Function MMSE - Mini Mental State Exam 04/17/2016  Orientation to time  5  Orientation to Place 5  Registration 3  Attention/ Calculation 5  Recall 2  Language- name 2 objects 2  Language- repeat 1  Language- follow 3 step command 3  Language- read & follow direction 1  Write a sentence 1  Copy design 1  Total score 29        Immunization History  Administered Date(s) Administered  . Influenza Split 11/14/2010  . Influenza Whole 11/08/2005, 10/28/2007, 11/23/2008, 11/26/2009  . Influenza, High Dose Seasonal PF 10/19/2015  . Influenza,inj,Quad PF,36+ Mos 11/26/2012, 09/22/2013, 10/20/2014  . Pneumococcal Conjugate-13 04/01/2013  . Pneumococcal Polysaccharide-23 10/30/2001, 07/27/2009  . Td 11/27/2001  . Tetanus 04/01/2013   Screening Tests Health Maintenance  Topic Date Due  . OPHTHALMOLOGY EXAM  08/10/2015  . HEMOGLOBIN A1C  06/30/2016  . FOOT EXAM  04/17/2017  . TETANUS/TDAP  04/02/2023  . INFLUENZA VACCINE  Completed  . DEXA SCAN  Completed  . PNA vac Low Risk Adult  Completed      Plan:     Continue to eat heart healthy diet (full of fruits, vegetables, whole grains, lean protein, water--limit salt, fat, and sugar intake) and increase physical activity as tolerated.  Continue doing brain stimulating activities (puzzles, reading, adult coloring books, staying active) to keep memory sharp.   Schedule Bone Scan--ordered today.  During the course of the visit the patient was educated and counseled about the following appropriate screening and preventive services:   Vaccines to include Pneumoccal, Influenza,  Hepatitis B, Td, HCV  Cardiovascular Disease  Colorectal cancer screening  Bone density screening  Diabetes screening  Glaucoma screening  Mammography/PAP  Nutrition counseling   Patient Instructions (the written plan) was given to the patient.   Shela Nevin, [place]  04/17/2016

## 2016-04-17 ENCOUNTER — Encounter: Payer: Self-pay | Admitting: Family Medicine

## 2016-04-17 ENCOUNTER — Ambulatory Visit (INDEPENDENT_AMBULATORY_CARE_PROVIDER_SITE_OTHER): Payer: Medicare Other | Admitting: Family Medicine

## 2016-04-17 VITALS — BP 152/84 | HR 62 | Ht 63.0 in | Wt 175.0 lb

## 2016-04-17 DIAGNOSIS — Z0000 Encounter for general adult medical examination without abnormal findings: Secondary | ICD-10-CM

## 2016-04-17 DIAGNOSIS — L84 Corns and callosities: Secondary | ICD-10-CM | POA: Diagnosis not present

## 2016-04-17 DIAGNOSIS — F411 Generalized anxiety disorder: Secondary | ICD-10-CM

## 2016-04-17 DIAGNOSIS — I10 Essential (primary) hypertension: Secondary | ICD-10-CM | POA: Diagnosis not present

## 2016-04-17 DIAGNOSIS — E782 Mixed hyperlipidemia: Secondary | ICD-10-CM | POA: Diagnosis not present

## 2016-04-17 DIAGNOSIS — E1169 Type 2 diabetes mellitus with other specified complication: Secondary | ICD-10-CM

## 2016-04-17 DIAGNOSIS — N390 Urinary tract infection, site not specified: Secondary | ICD-10-CM

## 2016-04-17 DIAGNOSIS — Z780 Asymptomatic menopausal state: Secondary | ICD-10-CM | POA: Diagnosis not present

## 2016-04-17 DIAGNOSIS — E669 Obesity, unspecified: Secondary | ICD-10-CM

## 2016-04-17 LAB — LIPID PANEL
Cholesterol: 218 mg/dL — ABNORMAL HIGH (ref 0–200)
HDL: 36 mg/dL — ABNORMAL LOW (ref >39.00)
LDL Cholesterol: 154 mg/dL — ABNORMAL HIGH (ref 0–99)
NonHDL: 182.33
Total CHOL/HDL Ratio: 6
Triglycerides: 142 mg/dL (ref 0.0–149.0)
VLDL: 28.4 mg/dL (ref 0.0–40.0)

## 2016-04-17 LAB — COMPREHENSIVE METABOLIC PANEL
ALT: 10 U/L (ref 0–35)
AST: 18 U/L (ref 0–37)
Albumin: 4.1 g/dL (ref 3.5–5.2)
Alkaline Phosphatase: 63 U/L (ref 39–117)
BUN: 17 mg/dL (ref 6–23)
CO2: 29 mEq/L (ref 19–32)
Calcium: 9.7 mg/dL (ref 8.4–10.5)
Chloride: 106 mEq/L (ref 96–112)
Creatinine, Ser: 0.74 mg/dL (ref 0.40–1.20)
GFR: 80.19 mL/min (ref >60.00)
Glucose, Bld: 100 mg/dL — ABNORMAL HIGH (ref 70–99)
Potassium: 4.9 mEq/L (ref 3.5–5.1)
Sodium: 141 mEq/L (ref 135–145)
Total Bilirubin: 0.6 mg/dL (ref 0.2–1.2)
Total Protein: 7.4 g/dL (ref 6.0–8.3)

## 2016-04-17 LAB — CBC
HCT: 42.2 % (ref 36.0–46.0)
Hemoglobin: 13.8 g/dL (ref 12.0–15.0)
MCHC: 32.7 g/dL (ref 30.0–36.0)
MCV: 90.8 fl (ref 78.0–100.0)
Platelets: 345 10*3/uL (ref 150.0–400.0)
RBC: 4.64 Mil/uL (ref 3.87–5.11)
RDW: 15.1 % (ref 11.5–15.5)
WBC: 8.8 10*3/uL (ref 4.0–10.5)

## 2016-04-17 LAB — TSH: TSH: 3.27 u[IU]/mL (ref 0.35–4.50)

## 2016-04-17 LAB — HEMOGLOBIN A1C: Hgb A1c MFr Bld: 6.5 % (ref 4.6–6.5)

## 2016-04-17 MED ORDER — ALPRAZOLAM 0.25 MG PO TABS: ORAL_TABLET | ORAL | 1 refills | Status: DC

## 2016-04-17 NOTE — Assessment & Plan Note (Signed)
Persistent will consider podiatry referral in future if symptoms recur

## 2016-04-17 NOTE — Assessment & Plan Note (Signed)
Always elevated here

## 2016-04-17 NOTE — Assessment & Plan Note (Signed)
asymptomatic

## 2016-04-17 NOTE — Progress Notes (Signed)
Patient ID: [name] Little, female   DOB: [birth year], 83 y.o.   MRN: [id number]   Subjective:  I acted as a Education administrator for Penni Homans, [place], [place]   Patient ID: [name] Little, female    DOB: 06/21/1933, 83 y.o.   MRN: [id number]  Chief Complaint  Patient presents with  . Medicare Wellness    Audiological scientist.    Hypertension  This is a chronic problem. The problem is controlled. Associated symptoms include malaise/fatigue. Pertinent negatives include no blurred vision, chest pain, headaches, palpitations or shortness of breath. Risk factors for coronary artery disease include diabetes mellitus.  Hyperlipidemia  This is a chronic problem. The problem is controlled. Pertinent negatives include no chest pain or shortness of breath. Risk factors for coronary artery disease include hypertension and diabetes mellitus.    Patient is in today for a 74-month follow up. Patient has a Hx of HTN, hyperlipidemia, Type II Diabetes. No additional concerns noted at this time. She denies polyuria or polydipsia. No recent febrile illness or hospitalizations. She is struggling with stress due to her husband's failing health and irritability. Denies CP/palp/SOB/HA/congestion/fevers/GI or GU c/o. Taking meds as prescribed. Does find the Alprazolam helpful but takes it very infrequently.   Patient Care Team: Mosie Lukes, MD as PCP - General (Family Medicine) Hurman Horn, MD as Consulting Physician (Ophthalmology)   Past Medical History:  Diagnosis Date  . Anxiety state, unspecified 09/22/2013  . Arthritis    lumbar stenosis, radiculopathy, OA- L hip  . BCC (basal cell carcinoma), scalp/neck 09/27/2015  . Blood clotting disorder (HCC)    V5 factor  . Blood dyscrasia    factor 5 deficiency  . Clostridium difficile infection   . Clotting disorder ([place])   . Diabetes mellitus type 2 in obese ([facility]) 02/26/2014  . DVT (deep venous thrombosis) ([place])   . Factor V deficiency ([place])   . Hemorrhoids   .  Hyperglycemia 02/26/2014  . Hyperlipemia   . Hypertension   . Infiltrating ductal carcinoma of breast ([facility])    stage one right   . Low back pain 03/26/2014  . Macular degeneration   . Mixed hyperlipidemia 05/18/2015  . Nocturia 02/26/2014  . Paroxysmal supraventricular tachycardia ([place])   . Personal history of colonic polyps    hyperplastic  . Thoracic aortic aneurysm ([place])   . TMJ pain dysfunction syndrome     Past Surgical History:  Procedure Laterality Date  . ABDOMINAL HYSTERECTOMY     1960- ? vaginal hysterectomy, not laparoscopic   . BREAST LUMPECTOMY  2010   right Dr. [name]  . COLONOSCOPY    . EYE SURGERY     bilateral cataracts- /w IOL   . JOINT REPLACEMENT     R hip replacement   . LAPAROSCOPIC HYSTERECTOMY    . LUMBAR LAMINECTOMY/DECOMPRESSION MICRODISCECTOMY  04/11/2011   Procedure: LUMBAR LAMINECTOMY/DECOMPRESSION MICRODISCECTOMY;  Surgeon: [name], MD;  Location: [facility];  Service: Neurosurgery;  Laterality: N/A;  Lumbar Five-Sacral One Microdiscectomy  . right calf surgery     for cancer  . SPLENECTOMY    . TOTAL HIP ARTHROPLASTY  1999   right  . UPPER GASTROINTESTINAL ENDOSCOPY      Family History  Problem Relation Age of Onset  . Heart disease Mother   . Heart disease Father   . Alzheimer's disease Sister   . Heart disease      uncles  . Clotting disorder Maternal Uncle   . Breast cancer  Paternal Aunt   . Cancer Paternal Aunt     breast  . Colon cancer Neg Hx   . Anesthesia problems Neg Hx   . Hypotension Neg Hx   . Malignant hyperthermia Neg Hx   . Pseudochol deficiency Neg Hx     Social History   Social History  . Marital status: Married    Spouse name: N/A  . Number of children: 4  . Years of education: N/A   Occupational History  . Retired    Social History Main Topics  . Smoking status: Never Smoker  . Smokeless tobacco: Never Used  . Alcohol use No  . Drug use: No  . Sexual activity: No   Other Topics Concern  .  Not on file   Social History Narrative   Married 1955   2 sons - '59, '61, 2 daughters '55, '57; 8 grandchildren; 1 great grand   I-ADLs          Outpatient Medications Prior to Visit  Medication Sig Dispense Refill  . B Complex Vitamins (VITAMIN B COMPLEX PO) Take by mouth.    . losartan (COZAAR) 100 MG tablet TAKE 1 TABLET (100 MG TOTAL) BY MOUTH DAILY. 90 tablet 1  . warfarin (COUMADIN) 5 MG tablet TAKE AS DIRECTED BY COUMADIN CLINIC. 90 tablet 3  . ALPRAZolam (XANAX) 0.25 MG tablet TAKE ONE TABLET BY MOUTH TWICE DIALY AS NEEDED FOR ANXIETY 10 tablet 1  . furosemide (LASIX) 20 MG tablet Take 1 tablet (20 mg total) by mouth daily as needed for fluid or edema. (Patient not taking: Reported on 04/17/2016) 30 tablet 1  . potassium chloride SA (K-DUR,KLOR-CON) 20 MEQ tablet Take 1 tablet (20 mEq total) by mouth daily as needed (lasix use). (Patient not taking: Reported on 04/17/2016) 30 tablet 0  . clindamycin (CLEOCIN) 300 MG capsule Take 1 capsule (300 mg total) by mouth 3 (three) times daily. 30 capsule 0   No facility-administered medications prior to visit.     Allergies  Allergen Reactions  . Amlodipine Swelling    "my whole body swelled"  . Carvedilol Swelling    "my whole body swelled"  . Cefuroxime Axetil     REACTION: Ddoes not remember reaction  . Statins     Weakness, pain Lipitor and Pravachol  . Sulfonamide Derivatives     REACTION: Rash    Review of Systems  Constitutional: Positive for malaise/fatigue. Negative for fever.  HENT: Negative for congestion.   Eyes: Negative for blurred vision.  Respiratory: Negative for cough and shortness of breath.   Cardiovascular: Negative for chest pain, palpitations and leg swelling.  Gastrointestinal: Negative for vomiting.  Musculoskeletal: Negative for back pain.  Skin: Negative for rash.  Neurological: Negative for loss of consciousness and headaches.  Psychiatric/Behavioral: Negative for depression. The patient is  nervous/anxious.        Objective:    Physical Exam  Constitutional: She is oriented to person, place, and time. She appears well-developed and well-nourished. No distress.  HENT:  Head: Normocephalic and atraumatic.  Eyes: Conjunctivae are normal.  Neck: Normal range of motion. No thyromegaly present.  Cardiovascular: Normal rate and regular rhythm.   Pulmonary/Chest: Effort normal and breath sounds normal. She has no wheezes.  Abdominal: Soft. Bowel sounds are normal. There is no tenderness.  Musculoskeletal: She exhibits no edema or deformity.  Neurological: She is alert and oriented to person, place, and time.  Skin: Skin is warm and dry. She is not diaphoretic.  Psychiatric: She has a normal mood and affect.    BP (!) 152/84   Pulse 62   Ht 5\' 3"  (1.6 m)   Wt 175 lb (79.4 kg)   SpO2 97%   BMI 31.00 kg/m  Wt Readings from Last 3 Encounters:  04/17/16 175 lb (79.4 kg)  12/28/15 174 lb 4 oz (79 kg)  12/25/15 173 lb (78.5 kg)   BP Readings from Last 3 Encounters:  04/17/16 (!) 152/84  12/28/15 132/86  12/25/15 140/90     Immunization History  Administered Date(s) Administered  . Influenza Split 11/14/2010  . Influenza Whole 11/08/2005, 10/28/2007, 11/23/2008, 11/26/2009  . Influenza, High Dose Seasonal PF 10/19/2015  . Influenza,inj,Quad PF,36+ Mos 11/26/2012, 09/22/2013, 10/20/2014  . Pneumococcal Conjugate-13 04/01/2013  . Pneumococcal Polysaccharide-23 10/30/2001, 07/27/2009  . Td 11/27/2001  . Tetanus 04/01/2013    Health Maintenance  Topic Date Due  . OPHTHALMOLOGY EXAM  08/10/2015  . HEMOGLOBIN A1C  06/30/2016  . FOOT EXAM  04/17/2017  . TETANUS/TDAP  04/02/2023  . INFLUENZA VACCINE  Completed  . DEXA SCAN  Completed  . PNA vac Low Risk Adult  Completed    Lab Results  Component Value Date   WBC 9.6 12/31/2015   HGB 13.9 12/31/2015   HCT 42.2 12/31/2015   PLT 333.0 12/31/2015   GLUCOSE 93 12/31/2015   CHOL 226 (H) 12/31/2015   TRIG 220.0 (H)  12/31/2015   HDL 34.70 (L) 12/31/2015   LDLDIRECT 133.0 12/31/2015   LDLCALC 159 (H) 02/08/2015   ALT 17 12/31/2015   AST 25 12/31/2015   NA 142 12/31/2015   K 4.9 12/31/2015   CL 106 12/31/2015   CREATININE 0.80 12/31/2015   BUN 16 12/31/2015   CO2 26 12/31/2015   TSH 3.40 12/31/2015   INR 3.0 03/31/2016   HGBA1C 6.5 12/31/2015   MICROALBUR 1.5 10/20/2014    Lab Results  Component Value Date   TSH 3.40 12/31/2015   Lab Results  Component Value Date   WBC 9.6 12/31/2015   HGB 13.9 12/31/2015   HCT 42.2 12/31/2015   MCV 89.2 12/31/2015   PLT 333.0 12/31/2015   Lab Results  Component Value Date   NA 142 12/31/2015   K 4.9 12/31/2015   CHLORIDE 109 08/12/2013   CO2 26 12/31/2015   GLUCOSE 93 12/31/2015   BUN 16 12/31/2015   CREATININE 0.80 12/31/2015   BILITOT 0.6 12/31/2015   ALKPHOS 67 12/31/2015   AST 25 12/31/2015   ALT 17 12/31/2015   PROT 7.5 12/31/2015   ALBUMIN 3.8 12/31/2015   CALCIUM 9.4 12/31/2015   ANIONGAP 13 11/26/2013   GFR 73.35 12/31/2015   Lab Results  Component Value Date   CHOL 226 (H) 12/31/2015   Lab Results  Component Value Date   HDL 34.70 (L) 12/31/2015   Lab Results  Component Value Date   LDLCALC 159 (H) 02/08/2015   Lab Results  Component Value Date   TRIG 220.0 (H) 12/31/2015   Lab Results  Component Value Date   CHOLHDL 7 12/31/2015   Lab Results  Component Value Date   HGBA1C 6.5 12/31/2015         Assessment & Plan:   Problem List Items Addressed This Visit    Hyperlipidemia    Tolerating statin, encouraged heart healthy diet, avoid trans fats, minimize simple carbs and saturated fats. Increase exercise as tolerated      Relevant Orders   Lipid panel   Essential hypertension  Always elevated here      Relevant Orders   CBC   Comprehensive metabolic panel   TSH   CALLUS, TOE    Persistent will consider podiatry referral in future if symptoms recur      Anxiety state    Consider counseling  in future if worsens, allowed a refill on the Alprazolam to use sparingly reminded this med has risk to it including falls and confusion but she notes it helps when her husband is really stressing her. Feels better when she works in her garden      Relevant Medications   ALPRAZolam (XANAX) 0.25 MG tablet   Obesity (BMI 30-39.9)    Encouraged DASH diet, decrease po intake and increase exercise as tolerated. Needs 7-8 hours of sleep nightly. Avoid trans fats, eat small, frequent meals every 4-5 hours with lean proteins, complex carbs and healthy fats. Minimize simple carbs, referred for possible bariatrics      Diabetes mellitus type 2 in obese (HCC)    hgba1c acceptable, minimize simple carbs. Increase exercise as tolerated. Continue current meds      Relevant Orders   Hemoglobin A1c   Urinary tract infectious disease    asymptomatic       Other Visit Diagnoses    Postmenopausal    -  Primary   Relevant Orders   DG Bone Density   Encounter for Medicare annual wellness exam          I have discontinued Ms. Giacomo's clindamycin. I am also having her maintain her B Complex Vitamins (VITAMIN B COMPLEX PO), furosemide, potassium chloride SA, losartan, warfarin, and ALPRAZolam.  Meds ordered this encounter  Medications  . ALPRAZolam (XANAX) 0.25 MG tablet    Sig: TAKE ONE TABLET BY MOUTH TWICE DIALY AS NEEDED FOR ANXIETY    Dispense:  20 tablet    Refill:  1    This request is for a new prescription for a controlled substance as required by Federal/State law.Marland Kitchen    CMA served as Education administrator during this visit. History, Physical and Plan performed by medical provider. Documentation and orders reviewed and attested to.  Penni Homans, MD

## 2016-04-17 NOTE — Patient Instructions (Addendum)
Continue to eat heart healthy diet (full of fruits, vegetables, whole grains, lean protein, water--limit salt, fat, and sugar intake) and increase physical activity as tolerated.  Continue doing brain stimulating activities (puzzles, reading, adult coloring books, staying active) to keep memory sharp.   Schedule Bone Scan--ordered today.  Carbohydrate Counting for Diabetes Mellitus, Adult Carbohydrate counting is a method for keeping track of how many carbohydrates you eat. Eating carbohydrates naturally increases the amount of sugar (glucose) in the blood. Counting how many carbohydrates you eat helps keep your blood glucose within normal limits, which helps you manage your diabetes (diabetes mellitus). It is important to know how many carbohydrates you can safely have in each meal. This is different for every person. A diet and nutrition specialist (registered dietitian) can help you make a meal plan and calculate how many carbohydrates you should have at each meal and snack. Carbohydrates are found in the following foods:  Grains, such as breads and cereals.  Dried beans and soy products.  Starchy vegetables, such as potatoes, peas, and corn.  Fruit and fruit juices.  Milk and yogurt.  Sweets and snack foods, such as cake, cookies, candy, chips, and soft drinks. How do I count carbohydrates? There are two ways to count carbohydrates in food. You can use either of the methods or a combination of both. Reading "Nutrition Facts" on packaged food  The "Nutrition Facts" list is included on the labels of almost all packaged foods and beverages in the [place] It includes:  The serving size.  Information about nutrients in each serving, including the grams (g) of carbohydrate per serving. To use the "Nutrition Facts":  Decide how many servings you will have.  Multiply the number of servings by the number of carbohydrates per serving.  The resulting number is the total amount of  carbohydrates that you will be having. Learning standard serving sizes of other foods  When you eat foods containing carbohydrates that are not packaged or do not include "Nutrition Facts" on the label, you need to measure the servings in order to count the amount of carbohydrates:  Measure the foods that you will eat with a food scale or measuring cup, if needed.  Decide how many standard-size servings you will eat.  Multiply the number of servings by 15. Most carbohydrate-rich foods have about 15 g of carbohydrates per serving.  For example, if you eat 8 oz (170 g) of strawberries, you will have eaten 2 servings and 30 g of carbohydrates (2 servings x 15 g = 30 g).  For foods that have more than one food mixed, such as soups and casseroles, you must count the carbohydrates in each food that is included. The following list contains standard serving sizes of common carbohydrate-rich foods. Each of these servings has about 15 g of carbohydrates:   hamburger bun or  English muffin.   oz (15 mL) syrup.   oz (14 g) jelly.  1 slice of bread.  1 six-inch tortilla.  3 oz (85 g) cooked rice or pasta.  4 oz (113 g) cooked dried beans.  4 oz (113 g) starchy vegetable, such as peas, corn, or potatoes.  4 oz (113 g) hot cereal.  4 oz (113 g) mashed potatoes or  of a large baked potato.  4 oz (113 g) canned or frozen fruit.  4 oz (120 mL) fruit juice.  4-6 crackers.  6 chicken nuggets.  6 oz (170 g) unsweetened dry cereal.  6 oz (170 g) plain  fat-free yogurt or yogurt sweetened with artificial sweeteners.  8 oz (240 mL) milk.  8 oz (170 g) fresh fruit or one small piece of fruit.  24 oz (680 g) popped popcorn. Example of carbohydrate counting Sample meal   3 oz (85 g) chicken breast.  6 oz (170 g) brown rice.  4 oz (113 g) corn.  8 oz (240 mL) milk.  8 oz (170 g) strawberries with sugar-free whipped topping. Carbohydrate calculation  1. Identify the foods that  contain carbohydrates:  Rice.  Corn.  Milk.  Strawberries. 2. Calculate how many servings you have of each food:  2 servings rice.  1 serving corn.  1 serving milk.  1 serving strawberries. 3. Multiply each number of servings by 15 g:  2 servings rice x 15 g = 30 g.  1 serving corn x 15 g = 15 g.  1 serving milk x 15 g = 15 g.  1 serving strawberries x 15 g = 15 g. 4. Add together all of the amounts to find the total grams of carbohydrates eaten:  30 g + 15 g + 15 g + 15 g = 75 g of carbohydrates total. This information is not intended to replace advice given to you by your health care provider. Make sure you discuss any questions you have with your health care provider. Document Released: 01/16/2005 Document Revised: 08/06/2015 Document Reviewed: 06/30/2015 Elsevier Interactive Patient Education  2017 [employer].

## 2016-04-17 NOTE — Progress Notes (Signed)
Pre visit review using our clinic review tool, if applicable. No additional management support is needed unless otherwise documented below in the visit note. 

## 2016-04-17 NOTE — Assessment & Plan Note (Addendum)
Encouraged DASH diet, decrease po intake and increase exercise as tolerated. Needs 7-8 hours of sleep nightly. Avoid trans fats, eat small, frequent meals every 4-5 hours with lean proteins, complex carbs and healthy fats. Minimize simple carbs, referred for possible bariatrics

## 2016-04-17 NOTE — Assessment & Plan Note (Signed)
Tolerating statin, encouraged heart healthy diet, avoid trans fats, minimize simple carbs and saturated fats. Increase exercise as tolerated 

## 2016-04-17 NOTE — Assessment & Plan Note (Signed)
Consider counseling in future if worsens, allowed a refill on the Alprazolam to use sparingly reminded this med has risk to it including falls and confusion but she notes it helps when her husband is really stressing her. Feels better when she works in her garden

## 2016-04-17 NOTE — Assessment & Plan Note (Signed)
hgba1c acceptable, minimize simple carbs. Increase exercise as tolerated. Continue current meds 

## 2016-04-24 ENCOUNTER — Ambulatory Visit (HOSPITAL_BASED_OUTPATIENT_CLINIC_OR_DEPARTMENT_OTHER)
Admission: RE | Admit: 2016-04-24 | Discharge: 2016-04-24 | Disposition: A | Discharge status: Home / Self Care | Payer: Medicare Other | Source: Ambulatory Visit | Attending: Family Medicine | Admitting: Family Medicine

## 2016-04-24 DIAGNOSIS — M85852 Other specified disorders of bone density and structure, left thigh: Secondary | ICD-10-CM | POA: Insufficient documentation

## 2016-04-24 DIAGNOSIS — M8588 Other specified disorders of bone density and structure, other site: Secondary | ICD-10-CM | POA: Insufficient documentation

## 2016-04-24 DIAGNOSIS — Z780 Asymptomatic menopausal state: Secondary | ICD-10-CM

## 2016-05-05 ENCOUNTER — Other Ambulatory Visit: Payer: Self-pay | Admitting: Family Medicine

## 2016-05-05 NOTE — Telephone Encounter (Signed)
Caller name: Karyme  Relation to pt: self  Call back number: [phone] Pharmacy:  [id number] - [place], [place] - [address]  Reason for call: Pt requesting refill on Clonidine HCL 0.1 MG Tablet. Please advise.

## 2016-05-05 NOTE — Telephone Encounter (Signed)
Please call pt and see why she is asking for it now

## 2016-05-05 NOTE — Telephone Encounter (Signed)
She has not had this filled for a while but is on her historical list.( I have pulled it out of her historical list last filled in 2016) Clonidine 0.1 mg bid----is it ok to fill for her??

## 2016-05-05 NOTE — Telephone Encounter (Signed)
Spoke to the patient to the patient (who was in the lobby waiting on her husband seeing Dr. Etter Sjogren). She stated her husband is not doing well and stresses her out /BP goes up and clonidine brings it down. Dr. Etter Sjogren said she needs to be seen by PCP on Monday. She had no openings. But the patient promised to schedule with Dr. Nani Ravens on Monday to discuss.

## 2016-05-08 ENCOUNTER — Ambulatory Visit (INDEPENDENT_AMBULATORY_CARE_PROVIDER_SITE_OTHER): Payer: Medicare Other | Admitting: Family Medicine

## 2016-05-08 ENCOUNTER — Encounter: Payer: Self-pay | Admitting: Family Medicine

## 2016-05-08 VITALS — BP 180/82 | HR 64 | Temp 97.8°F | Ht 63.0 in | Wt 174.0 lb

## 2016-05-08 DIAGNOSIS — M545 Low back pain: Secondary | ICD-10-CM

## 2016-05-08 DIAGNOSIS — I10 Essential (primary) hypertension: Secondary | ICD-10-CM

## 2016-05-08 DIAGNOSIS — S29012A Strain of muscle and tendon of back wall of thorax, initial encounter: Secondary | ICD-10-CM

## 2016-05-08 MED ORDER — TRAMADOL HCL 50 MG PO TABS: 50.0000 mg | ORAL_TABLET | Freq: Two times a day (BID) | ORAL | 0 refills | Status: DC | PRN

## 2016-05-08 NOTE — Progress Notes (Signed)
Chief Complaint  Patient presents with  . Discuss medications  . Back Pain    upper (R) back-x 4 days    Subjective [name] Little is a 83 y.o. female who presents for hypertension follow up. She does monitor home blood pressures. Blood pressures ranging from 150's/60-70's on average.He only checks it when she is flustered, around 1-2 times per month. She is compliant with medications losartan 100 mg daily. Patient has these side effects of medication: none She is not adhering to a healthy diet overall. Current exercise: Active around and outside the house  Feel on Mar 3 and fell on the R side into a door/wall. She recovered quickly and well, however that over the past 4 days, has noticed some upper right back pain. No injury or change in activity associated with this. It is achy in nature and has been waking her up from sleep. Certain positions make it worse. Heat helps slightly. She has tried Tylenol to no avail. No numbness, tingling, or weakness.   Past Medical History:  Diagnosis Date  . Anxiety state, unspecified 09/22/2013  . Arthritis    lumbar stenosis, radiculopathy, OA- L hip  . BCC (basal cell carcinoma), scalp/neck 09/27/2015  . Blood clotting disorder (HCC)    V5 factor  . Blood dyscrasia    factor 5 deficiency  . Clostridium difficile infection   . Clotting disorder ([place])   . Diabetes mellitus type 2 in obese ([place]) 02/26/2014  . DVT (deep venous thrombosis) ([place])   . Factor V deficiency ([place])   . Hemorrhoids   . Hyperglycemia 02/26/2014  . Hyperlipemia   . Hypertension   . Infiltrating ductal carcinoma of breast ([place])    stage one right   . Low back pain 03/26/2014  . Macular degeneration   . Mixed hyperlipidemia 05/18/2015  . Nocturia 02/26/2014  . Paroxysmal supraventricular tachycardia ([place])   . Personal history of colonic polyps    hyperplastic  . Thoracic aortic aneurysm ([place])   . TMJ pain dysfunction syndrome    Family History  Problem Relation Age of  Onset  . Heart disease Mother   . Heart disease Father   . Alzheimer's disease Sister   . Heart disease      uncles  . Clotting disorder Maternal Uncle   . Breast cancer Paternal Aunt   . Cancer Paternal Aunt     breast  . Colon cancer Neg Hx   . Anesthesia problems Neg Hx   . Hypotension Neg Hx   . Malignant hyperthermia Neg Hx   . Pseudochol deficiency Neg Hx    Medications Current Outpatient Prescriptions on File Prior to Visit  Medication Sig Dispense Refill  . ALPRAZolam (XANAX) 0.25 MG tablet TAKE ONE TABLET BY MOUTH TWICE DIALY AS NEEDED FOR ANXIETY 20 tablet 1  . B Complex Vitamins (VITAMIN B COMPLEX PO) Take by mouth.    . furosemide (LASIX) 20 MG tablet Take 1 tablet (20 mg total) by mouth daily as needed for fluid or edema. 30 tablet 1  . losartan (COZAAR) 100 MG tablet TAKE 1 TABLET (100 MG TOTAL) BY MOUTH DAILY. 90 tablet 1  . potassium chloride SA (K-DUR,KLOR-CON) 20 MEQ tablet Take 1 tablet (20 mEq total) by mouth daily as needed (lasix use). 30 tablet 0  . warfarin (COUMADIN) 5 MG tablet TAKE AS DIRECTED BY COUMADIN CLINIC. 90 tablet 3   Allergies Allergies  Allergen Reactions  . Amlodipine Swelling    "my whole body swelled"  .  Carvedilol Swelling    "my whole body swelled"  . Cefuroxime Axetil     REACTION: Ddoes not remember reaction  . Statins     Weakness, pain Lipitor and Pravachol  . Sulfonamide Derivatives     REACTION: Rash    Review of Systems Cardiovascular: no chest pain Respiratory:  no shortness of breath  Exam BP (!) 180/82 (BP Location: Left Arm, Patient Position: Sitting, Cuff Size: Normal)   Pulse 64   Temp 97.8 F (36.6 C) (Oral)   Ht '5\' 3"'  (1.6 m)   Wt 174 lb (78.9 kg)   SpO2 95%   BMI 30.82 kg/m  General:  well developed, well nourished, in no apparent distress Skin:  warm, no pallor or diaphoresis Eyes:  pupils equal and round, sclera anicteric without injection Heart :RRR, no murmurs, no bruits, no LE edema Lungs:   clear to auscultation, no accessory muscle use MSK: +TTP over R rhomboid, posterior rib 6 on R, no paraspinal TTP Psych: well oriented with normal range of affect and appropriate judgment/insight  PROCEDURE NOTE After discussing the procedure and risks, including but not limited to increased pain or stiffness and rarely nausea or dizziness, verbal consent was obtained.  Pre-procedure diagnosis: Somatic dysfunction Post-procedure diagnosis: Same Procedure: OMT  Regions treated include R rib cage: MET with the tissue response noted to be moderately improved.  The patient tolerated the procedure well, and there were no complications noted.  The patient was warned of the possibility of increased pain or stiffness of up to 48 hours duration and was asked to call with any unexpected problems.   Essential hypertension  Strain of rhomboid muscle, initial encounter - Plan: traMADol (ULTRAM) 50 MG tablet  Orders as above. Stop clonidine. Routine current regimen. I would like her to check her blood pressure once per week rather than just when she feels flustered. Her current regimen is likely controlling her blood pressure to goal. Counseled on diet and exercise. Tylenol and heat, some improvement with OMT. Tramadol for breakthrough pain. F/u in as originally scheduled with Dr. Charlett Blake. If no improvement in back pain in next 3-4 weeks, RTC and will discuss further OMT vs referral to PT. The patient voiced understanding and agreement to the plan.  [place], DO 05/08/16  8:41 AM

## 2016-05-08 NOTE — Progress Notes (Signed)
Pre visit review using our clinic review tool, if applicable. No additional management support is needed unless otherwise documented below in the visit note. 

## 2016-05-08 NOTE — Patient Instructions (Addendum)
Tylenol 1000 mg (2 extra strength tabs) every 6 hours for pain. Do this over the next 5 days.  Heat (pad or rice pillow in microwave) over affected area, 10-15 minutes every 2-3 hours while awake.   Check your blood pressure once per week until you see Dr. Charlett Blake next (07/25/16). Write down the readings. We will not change anything for the time being. Stop the clonidine. Use the Xanax if you get worked up.  If the pain medicine makes you confused or sleepy, stop taking it and let me know. Take it first at night to see how you do.   If you aren't better in the next 3-4 weeks, return to office and we will discuss the next steps.

## 2016-05-09 ENCOUNTER — Telehealth: Payer: Self-pay | Admitting: Family Medicine

## 2016-05-09 NOTE — Telephone Encounter (Signed)
OK to refill with one extra refill but needs UDS and contract

## 2016-05-09 NOTE — Telephone Encounter (Signed)
Caller name: [place] Relation to pt: self  Call back number: [phone] Pharmacy: [id number] - [place], [place] - [address]  Reason for call: Pt is requesting refill on Alprazolam (Xanax) 0.25 mg tablet. Please advise.

## 2016-05-09 NOTE — Telephone Encounter (Signed)
Requesting:   Alprazolam Contract    11/21/2013 UDS    Low risk done on 03/12/14 Last OV   04/17/2016 Last Refill   #20 no refills on 04/17/2016  Please Advise

## 2016-05-11 ENCOUNTER — Encounter: Payer: Self-pay | Admitting: Family Medicine

## 2016-05-11 MED ORDER — ALPRAZOLAM 0.25 MG PO TABS: ORAL_TABLET | ORAL | 1 refills | Status: DC

## 2016-05-11 NOTE — Telephone Encounter (Signed)
Patient informed of refill/PCP instructions. She verbalized understanding.

## 2016-05-12 ENCOUNTER — Ambulatory Visit (INDEPENDENT_AMBULATORY_CARE_PROVIDER_SITE_OTHER): Payer: Medicare Other | Admitting: General Practice

## 2016-05-12 DIAGNOSIS — Z5181 Encounter for therapeutic drug level monitoring: Secondary | ICD-10-CM | POA: Diagnosis not present

## 2016-05-12 DIAGNOSIS — Z86718 Personal history of other venous thrombosis and embolism: Secondary | ICD-10-CM

## 2016-05-12 LAB — POCT INR: INR: 3.4

## 2016-05-12 NOTE — Patient Instructions (Signed)
Pre visit review using our clinic review tool, if applicable. No additional management support is needed unless otherwise documented below in the visit note. 

## 2016-05-12 NOTE — Progress Notes (Signed)
I have reviewed and agree with the plan. 

## 2016-05-16 ENCOUNTER — Encounter: Payer: Self-pay | Admitting: Family Medicine

## 2016-06-09 ENCOUNTER — Ambulatory Visit: Payer: Medicare Other

## 2016-06-14 ENCOUNTER — Ambulatory Visit (INDEPENDENT_AMBULATORY_CARE_PROVIDER_SITE_OTHER): Payer: Medicare Other | Admitting: General Practice

## 2016-06-14 DIAGNOSIS — Z5181 Encounter for therapeutic drug level monitoring: Secondary | ICD-10-CM

## 2016-06-14 DIAGNOSIS — Z86718 Personal history of other venous thrombosis and embolism: Secondary | ICD-10-CM

## 2016-06-14 LAB — POCT INR: INR: 2.5

## 2016-06-14 NOTE — Patient Instructions (Signed)
Pre visit review using our clinic review tool, if applicable. No additional management support is needed unless otherwise documented below in the visit note. 

## 2016-06-14 NOTE — Progress Notes (Signed)
I have reviewed and agree with the plan. 

## 2016-07-19 ENCOUNTER — Ambulatory Visit: Payer: Medicare Other

## 2016-07-25 ENCOUNTER — Other Ambulatory Visit: Payer: Self-pay | Admitting: Family Medicine

## 2016-07-25 ENCOUNTER — Telehealth: Payer: Self-pay | Admitting: Family Medicine

## 2016-07-25 ENCOUNTER — Ambulatory Visit (INDEPENDENT_AMBULATORY_CARE_PROVIDER_SITE_OTHER): Payer: Medicare Other | Admitting: Family Medicine

## 2016-07-25 ENCOUNTER — Encounter: Payer: Self-pay | Admitting: Family Medicine

## 2016-07-25 DIAGNOSIS — I10 Essential (primary) hypertension: Secondary | ICD-10-CM

## 2016-07-25 DIAGNOSIS — E1169 Type 2 diabetes mellitus with other specified complication: Secondary | ICD-10-CM

## 2016-07-25 DIAGNOSIS — E669 Obesity, unspecified: Secondary | ICD-10-CM | POA: Diagnosis not present

## 2016-07-25 DIAGNOSIS — Z1231 Encounter for screening mammogram for malignant neoplasm of breast: Secondary | ICD-10-CM

## 2016-07-25 DIAGNOSIS — E782 Mixed hyperlipidemia: Secondary | ICD-10-CM | POA: Diagnosis not present

## 2016-07-25 LAB — COMPREHENSIVE METABOLIC PANEL
ALT: 11 U/L (ref 0–35)
AST: 17 U/L (ref 0–37)
Albumin: 4 g/dL (ref 3.5–5.2)
Alkaline Phosphatase: 61 U/L (ref 39–117)
BUN: 27 mg/dL — ABNORMAL HIGH (ref 6–23)
CO2: 30 mEq/L (ref 19–32)
Calcium: 9.8 mg/dL (ref 8.4–10.5)
Chloride: 104 mEq/L (ref 96–112)
Creatinine, Ser: 0.87 mg/dL (ref 0.40–1.20)
GFR: 66.48 mL/min (ref >60.00)
Glucose, Bld: 104 mg/dL — ABNORMAL HIGH (ref 70–99)
Potassium: 4.9 mEq/L (ref 3.5–5.1)
Sodium: 140 mEq/L (ref 135–145)
Total Bilirubin: 0.6 mg/dL (ref 0.2–1.2)
Total Protein: 7.3 g/dL (ref 6.0–8.3)

## 2016-07-25 LAB — CBC
HCT: 42.4 % (ref 36.0–46.0)
Hemoglobin: 13.9 g/dL (ref 12.0–15.0)
MCHC: 32.8 g/dL (ref 30.0–36.0)
MCV: 91.4 fl (ref 78.0–100.0)
Platelets: 370 10*3/uL (ref 150.0–400.0)
RBC: 4.64 Mil/uL (ref 3.87–5.11)
RDW: 14.6 % (ref 11.5–15.5)
WBC: 9.1 10*3/uL (ref 4.0–10.5)

## 2016-07-25 LAB — TSH: TSH: 3.64 u[IU]/mL (ref 0.35–4.50)

## 2016-07-25 LAB — LDL CHOLESTEROL, DIRECT: Direct LDL: 143 mg/dL

## 2016-07-25 LAB — LIPID PANEL
Cholesterol: 230 mg/dL — ABNORMAL HIGH (ref 0–200)
HDL: 33.1 mg/dL — ABNORMAL LOW (ref >39.00)
NonHDL: 196.85
Total CHOL/HDL Ratio: 7
Triglycerides: 235 mg/dL — ABNORMAL HIGH (ref 0.0–149.0)
VLDL: 47 mg/dL — ABNORMAL HIGH (ref 0.0–40.0)

## 2016-07-25 LAB — HEMOGLOBIN A1C: Hgb A1c MFr Bld: 6.6 % — ABNORMAL HIGH (ref 4.6–6.5)

## 2016-07-25 MED ORDER — GLUCOSE BLOOD VI STRP: ORAL_STRIP | 5 refills | Status: DC

## 2016-07-25 MED ORDER — LANCETS MISC: 5 refills | Status: DC

## 2016-07-25 MED ORDER — CHLORTHALIDONE 25 MG PO TABS: 25.0000 mg | ORAL_TABLET | Freq: Every day | ORAL | 1 refills | Status: DC

## 2016-07-25 MED ORDER — FREESTYLE SYSTEM KIT: 1.0000 | PACK | 0 refills | Status: DC | PRN

## 2016-07-25 NOTE — Progress Notes (Signed)
Subjective:  I acted as a Education administrator for Dr. Charlett Blake. [place], [place]  Patient ID: [name] Little, female    DOB: Feb 02, 1933, 83 y.o.   MRN: [id number]  No chief complaint on file.   HPI  Patient is in today for a 3 month follow up. She is following up on her HTN, DM and other medical concerns. She feels well no recent febrile illness or hospitalizations. Her husband is recovering from shingles so she is worried about them and agrees to go to her pharmacy to get shot due to cost. No polyuria or polydipsia. Denies CP/palp/SOB/HA/congestion/fevers/GI or GU c/o. Taking meds as prescribed. Does endorse some ongoing ankle edema and she notes her bp has been running high at home lately with recent 170/90. Was asymptomatic at that time.   Patient Care Team: Mosie Lukes, MD as PCP - General (Family Medicine) Zadie Rhine Clent Demark, MD as Consulting Physician (Ophthalmology)   Past Medical History:  Diagnosis Date  . Anxiety state, unspecified 09/22/2013  . Arthritis    lumbar stenosis, radiculopathy, OA- L hip  . BCC (basal cell carcinoma), scalp/neck 09/27/2015  . Blood clotting disorder (HCC)    V5 factor  . Blood dyscrasia    factor 5 deficiency  . Clostridium difficile infection   . Clotting disorder ([place])   . Diabetes mellitus type 2 in obese ([place]) 02/26/2014  . DVT (deep venous thrombosis) ([place])   . Factor V deficiency ([place])   . Hemorrhoids   . Hyperglycemia 02/26/2014  . Hyperlipemia   . Hypertension   . Infiltrating ductal carcinoma of breast ([place])    stage one right   . Low back pain 03/26/2014  . Macular degeneration   . Mixed hyperlipidemia 05/18/2015  . Nocturia 02/26/2014  . Paroxysmal supraventricular tachycardia ([place])   . Personal history of colonic polyps    hyperplastic  . Thoracic aortic aneurysm ([place])   . TMJ pain dysfunction syndrome     Past Surgical History:  Procedure Laterality Date  . ABDOMINAL HYSTERECTOMY     1960- ? vaginal hysterectomy, not laparoscopic   .  BREAST LUMPECTOMY  2010   right Dr. [name]  . COLONOSCOPY    . EYE SURGERY     bilateral cataracts- /w IOL   . JOINT REPLACEMENT     R hip replacement   . LAPAROSCOPIC HYSTERECTOMY    . LUMBAR LAMINECTOMY/DECOMPRESSION MICRODISCECTOMY  04/11/2011   Procedure: LUMBAR LAMINECTOMY/DECOMPRESSION MICRODISCECTOMY;  Surgeon: [name], MD;  Location: [facility];  Service: Neurosurgery;  Laterality: N/A;  Lumbar Five-Sacral One Microdiscectomy  . right calf surgery     for cancer  . SPLENECTOMY    . TOTAL HIP ARTHROPLASTY  1999   right  . UPPER GASTROINTESTINAL ENDOSCOPY      Family History  Problem Relation Age of Onset  . Heart disease Mother   . Heart disease Father   . Alzheimer's disease Sister   . Heart disease Unknown        uncles  . Clotting disorder Maternal Uncle   . Breast cancer Paternal Aunt   . Cancer Paternal Aunt        breast  . Colon cancer Neg Hx   . Anesthesia problems Neg Hx   . Hypotension Neg Hx   . Malignant hyperthermia Neg Hx   . Pseudochol deficiency Neg Hx     Social History   Social History  . Marital status: Married    Spouse name: N/A  . Number  of children: 4  . Years of education: N/A   Occupational History  . Retired    Social History Main Topics  . Smoking status: Never Smoker  . Smokeless tobacco: Never Used  . Alcohol use No  . Drug use: No  . Sexual activity: No   Other Topics Concern  . Not on file   Social History Narrative   Married 1955   2 sons - '59, '61, 2 daughters '55, '57; 8 grandchildren; 1 great grand   I-ADLs          Outpatient Medications Prior to Visit  Medication Sig Dispense Refill  . ALPRAZolam (XANAX) 0.25 MG tablet TAKE ONE TABLET BY MOUTH TWICE DIALY AS NEEDED FOR ANXIETY 20 tablet 1  . B Complex Vitamins (VITAMIN B COMPLEX PO) Take by mouth.    . furosemide (LASIX) 20 MG tablet Take 1 tablet (20 mg total) by mouth daily as needed for fluid or edema. 30 tablet 1  . losartan (COZAAR) 100 MG  tablet TAKE 1 TABLET (100 MG TOTAL) BY MOUTH DAILY. 90 tablet 1  . potassium chloride SA (K-DUR,KLOR-CON) 20 MEQ tablet Take 1 tablet (20 mEq total) by mouth daily as needed (lasix use). 30 tablet 0  . warfarin (COUMADIN) 5 MG tablet TAKE AS DIRECTED BY COUMADIN CLINIC. 90 tablet 3  . traMADol (ULTRAM) 50 MG tablet Take 1 tablet (50 mg total) by mouth every 12 (twelve) hours as needed. 20 tablet 0   No facility-administered medications prior to visit.     Allergies  Allergen Reactions  . Amlodipine Swelling    "my whole body swelled"  . Carvedilol Swelling    "my whole body swelled"  . Cefuroxime Axetil     REACTION: Ddoes not remember reaction  . Statins     Weakness, pain Lipitor and Pravachol  . Sulfonamide Derivatives     REACTION: Rash    Review of Systems  Constitutional: Positive for malaise/fatigue. Negative for fever.  HENT: Negative for congestion.   Eyes: Negative for blurred vision.  Respiratory: Negative for cough and shortness of breath.   Cardiovascular: Positive for leg swelling. Negative for chest pain and palpitations.  Gastrointestinal: Negative for vomiting.  Musculoskeletal: Negative for back pain.  Skin: Negative for rash.  Neurological: Negative for loss of consciousness and headaches.       Objective:    Physical Exam  Constitutional: She is oriented to person, place, and time. She appears well-developed and well-nourished. No distress.  HENT:  Head: Normocephalic and atraumatic.  Eyes: Conjunctivae are normal.  Neck: Normal range of motion. No thyromegaly present.  Cardiovascular: Normal rate and regular rhythm.   Pulmonary/Chest: Effort normal and breath sounds normal. She has no wheezes.  Abdominal: Soft. Bowel sounds are normal. There is no tenderness.  Musculoskeletal: Normal range of motion. She exhibits edema. She exhibits no deformity.  1+ pedal edema b/l lower extremities  Neurological: She is alert and oriented to person, place, and  time.  Skin: Skin is warm and dry. She is not diaphoretic.  Psychiatric: She has a normal mood and affect.    BP (!) 152/82 (BP Location: Left Arm, Patient Position: Sitting, Cuff Size: Normal)   Pulse 62   Temp 97.8 F (36.6 C) (Oral)   Resp 18   Wt 175 lb 3.2 oz (79.5 kg)   SpO2 96%   BMI 31.04 kg/m  Wt Readings from Last 3 Encounters:  07/25/16 175 lb 3.2 oz (79.5 kg)  05/08/16 174  lb (78.9 kg)  04/17/16 175 lb (79.4 kg)   BP Readings from Last 3 Encounters:  07/25/16 (!) 152/82  05/08/16 (!) 180/82  04/17/16 (!) 152/84     Immunization History  Administered Date(s) Administered  . Influenza Split 11/14/2010  . Influenza Whole 11/08/2005, 10/28/2007, 11/23/2008, 11/26/2009  . Influenza, High Dose Seasonal PF 10/19/2015  . Influenza,inj,Quad PF,36+ Mos 11/26/2012, 09/22/2013, 10/20/2014  . Pneumococcal Conjugate-13 04/01/2013  . Pneumococcal Polysaccharide-23 10/30/2001, 07/27/2009  . Td 11/27/2001  . Tetanus 04/01/2013    Health Maintenance  Topic Date Due  . OPHTHALMOLOGY EXAM  08/10/2015  . INFLUENZA VACCINE  08/30/2016  . HEMOGLOBIN A1C  10/18/2016  . FOOT EXAM  04/17/2017  . TETANUS/TDAP  04/02/2023  . DEXA SCAN  Completed  . PNA vac Low Risk Adult  Completed    Lab Results  Component Value Date   WBC 8.8 04/17/2016   HGB 13.8 04/17/2016   HCT 42.2 04/17/2016   PLT 345.0 04/17/2016   GLUCOSE 100 (H) 04/17/2016   CHOL 218 (H) 04/17/2016   TRIG 142.0 04/17/2016   HDL 36.00 (L) 04/17/2016   LDLDIRECT 133.0 12/31/2015   LDLCALC 154 (H) 04/17/2016   ALT 10 04/17/2016   AST 18 04/17/2016   NA 141 04/17/2016   K 4.9 04/17/2016   CL 106 04/17/2016   CREATININE 0.74 04/17/2016   BUN 17 04/17/2016   CO2 29 04/17/2016   TSH 3.27 04/17/2016   INR 2.5 06/14/2016   HGBA1C 6.5 04/17/2016   MICROALBUR 1.5 10/20/2014    Lab Results  Component Value Date   TSH 3.27 04/17/2016   Lab Results  Component Value Date   WBC 8.8 04/17/2016   HGB 13.8  04/17/2016   HCT 42.2 04/17/2016   MCV 90.8 04/17/2016   PLT 345.0 04/17/2016   Lab Results  Component Value Date   NA 141 04/17/2016   K 4.9 04/17/2016   CHLORIDE 109 08/12/2013   CO2 29 04/17/2016   GLUCOSE 100 (H) 04/17/2016   BUN 17 04/17/2016   CREATININE 0.74 04/17/2016   BILITOT 0.6 04/17/2016   ALKPHOS 63 04/17/2016   AST 18 04/17/2016   ALT 10 04/17/2016   PROT 7.4 04/17/2016   ALBUMIN 4.1 04/17/2016   CALCIUM 9.7 04/17/2016   ANIONGAP 13 11/26/2013   GFR 80.19 04/17/2016   Lab Results  Component Value Date   CHOL 218 (H) 04/17/2016   Lab Results  Component Value Date   HDL 36.00 (L) 04/17/2016   Lab Results  Component Value Date   LDLCALC 154 (H) 04/17/2016   Lab Results  Component Value Date   TRIG 142.0 04/17/2016   Lab Results  Component Value Date   CHOLHDL 6 04/17/2016   Lab Results  Component Value Date   HGBA1C 6.5 04/17/2016         Assessment & Plan:   Problem List Items Addressed This Visit    Hyperlipidemia    Encouraged heart healthy diet, increase exercise, avoid trans fats, consider a krill oil cap daily      Relevant Orders   Lipid panel   Essential hypertension    Poorly controlled will alter medications, encouraged DASH diet, minimize caffeine and obtain adequate sleep. Report concerning symptoms and follow up as directed and as needed. Add Chlorthalidone 25 mg daily      Relevant Orders   CBC   Comprehensive metabolic panel   TSH   Obesity (BMI 30-39.9)    Encouraged DASH diet, decrease  po intake and increase exercise as tolerated. Needs 7-8 hours of sleep nightly. Avoid trans fats, eat small, frequent meals every 4-5 hours with lean proteins, complex carbs and healthy fats. Minimize simple carbs, consider bariatric consultation.      Diabetes mellitus type 2 in obese (HCC)    hgba1c acceptable, minimize simple carbs. Increase exercise as tolerated. She agrees to pick up a glucometer and bring it back to next nurse  visit to learn how to use it then can check blood sugars daily and as needed      Relevant Orders   Hemoglobin A1c      I have discontinued Ms. Tucker's traMADol. I am also having her maintain her B Complex Vitamins (VITAMIN B COMPLEX PO), furosemide, potassium chloride SA, losartan, warfarin, and ALPRAZolam.  No orders of the defined types were placed in this encounter.   CMA served as Education administrator during this visit. History, Physical and Plan performed by medical provider. Documentation and orders reviewed and attested to.  Penni Homans, MD

## 2016-07-25 NOTE — Assessment & Plan Note (Signed)
Encouraged DASH diet, decrease po intake and increase exercise as tolerated. Needs 7-8 hours of sleep nightly. Avoid trans fats, eat small, frequent meals every 4-5 hours with lean proteins, complex carbs and healthy fats. Minimize simple carbs, consider bariatric consultation.

## 2016-07-25 NOTE — Assessment & Plan Note (Signed)
Encouraged heart healthy diet, increase exercise, avoid trans fats, consider a krill oil cap daily 

## 2016-07-25 NOTE — Assessment & Plan Note (Addendum)
hgba1c acceptable, minimize simple carbs. Increase exercise as tolerated. She agrees to pick up a glucometer and bring it back to next nurse visit to learn how to use it then can check blood sugars daily and as needed

## 2016-07-25 NOTE — Patient Instructions (Addendum)
Get 2D mammogram next month Take Losartan and Chlorthalidone together in morning if BP feels too low midday. Then move Losartan to evening  2-3 wk nurse visit for BP check, glucometer training and labwork/CMP 3 mn AWV with nurse and follow up appt with MD then 6 mn visit with MD for CPE [facility] have them send results to MD Carbohydrate Counting for Diabetes Mellitus, Adult Carbohydrate counting is a method for keeping track of how many carbohydrates you eat. Eating carbohydrates naturally increases the amount of sugar (glucose) in the blood. Counting how many carbohydrates you eat helps keep your blood glucose within normal limits, which helps you manage your diabetes (diabetes mellitus). It is important to know how many carbohydrates you can safely have in each meal. This is different for every person. A diet and nutrition specialist (registered dietitian) can help you make a meal plan and calculate how many carbohydrates you should have at each meal and snack. Carbohydrates are found in the following foods:  Grains, such as breads and cereals.  Dried beans and soy products.  Starchy vegetables, such as potatoes, peas, and corn.  Fruit and fruit juices.  Milk and yogurt.  Sweets and snack foods, such as cake, cookies, candy, chips, and soft drinks.  How do I count carbohydrates? There are two ways to count carbohydrates in food. You can use either of the methods or a combination of both. Reading "Nutrition Facts" on packaged food The "Nutrition Facts" list is included on the labels of almost all packaged foods and beverages in the [place] It includes:  The serving size.  Information about nutrients in each serving, including the grams (g) of carbohydrate per serving.  To use the "Nutrition Facts":  Decide how many servings you will have.  Multiply the number of servings by the number of carbohydrates per serving.  The resulting number is the  total amount of carbohydrates that you will be having.  Learning standard serving sizes of other foods When you eat foods containing carbohydrates that are not packaged or do not include "Nutrition Facts" on the label, you need to measure the servings in order to count the amount of carbohydrates:  Measure the foods that you will eat with a food scale or measuring cup, if needed.  Decide how many standard-size servings you will eat.  Multiply the number of servings by 15. Most carbohydrate-rich foods have about 15 g of carbohydrates per serving. ? For example, if you eat 8 oz (170 g) of strawberries, you will have eaten 2 servings and 30 g of carbohydrates (2 servings x 15 g = 30 g).  For foods that have more than one food mixed, such as soups and casseroles, you must count the carbohydrates in each food that is included.  The following list contains standard serving sizes of common carbohydrate-rich foods. Each of these servings has about 15 g of carbohydrates:   hamburger bun or  English muffin.   oz (15 mL) syrup.   oz (14 g) jelly.  1 slice of bread.  1 six-inch tortilla.  3 oz (85 g) cooked rice or pasta.  4 oz (113 g) cooked dried beans.  4 oz (113 g) starchy vegetable, such as peas, corn, or potatoes.  4 oz (113 g) hot cereal.  4 oz (113 g) mashed potatoes or  of a large baked potato.  4 oz (113 g) canned or frozen fruit.  4 oz (120 mL) fruit juice.  4-6 crackers.  6 chicken nuggets.  6 oz (170 g) unsweetened dry cereal.  6 oz (170 g) plain fat-free yogurt or yogurt sweetened with artificial sweeteners.  8 oz (240 mL) milk.  8 oz (170 g) fresh fruit or one small piece of fruit.  24 oz (680 g) popped popcorn.  Example of carbohydrate counting Sample meal  3 oz (85 g) chicken breast.  6 oz (170 g) brown rice.  4 oz (113 g) corn.  8 oz (240 mL) milk.  8 oz (170 g) strawberries with sugar-free whipped topping. Carbohydrate  calculation 1. Identify the foods that contain carbohydrates: ? Rice. ? Corn. ? Milk. ? Strawberries. 2. Calculate how many servings you have of each food: ? 2 servings rice. ? 1 serving corn. ? 1 serving milk. ? 1 serving strawberries. 3. Multiply each number of servings by 15 g: ? 2 servings rice x 15 g = 30 g. ? 1 serving corn x 15 g = 15 g. ? 1 serving milk x 15 g = 15 g. ? 1 serving strawberries x 15 g = 15 g. 4. Add together all of the amounts to find the total grams of carbohydrates eaten: ? 30 g + 15 g + 15 g + 15 g = 75 g of carbohydrates total. This information is not intended to replace advice given to you by your health care provider. Make sure you discuss any questions you have with your health care provider. Document Released: 01/16/2005 Document Revised: 08/06/2015 Document Reviewed: 06/30/2015 Elsevier Interactive Patient Education  [employer].

## 2016-07-25 NOTE — Assessment & Plan Note (Signed)
Poorly controlled will alter medications, encouraged DASH diet, minimize caffeine and obtain adequate sleep. Report concerning symptoms and follow up as directed and as needed. Add Chlorthalidone 25 mg daily

## 2016-07-25 NOTE — Telephone Encounter (Signed)
Caller name: Relationship to patient:Self Can be reached: [phone] Pharmacy:  [id number] [place], [place] [phone] (Phone) [phone] (Fax)   Reason for call: Patient called stating she was suppose to have a rx for another BP medication and it was not called in. Plse adv

## 2016-07-25 NOTE — Telephone Encounter (Signed)
Glucometer, Lancets and test strips have been sent to pharmacy

## 2016-07-25 NOTE — Addendum Note (Signed)
Addended by: Magdalene Molly A on: 07/25/2016 05:10 PM   Modules accepted: Orders

## 2016-07-31 ENCOUNTER — Ambulatory Visit (INDEPENDENT_AMBULATORY_CARE_PROVIDER_SITE_OTHER): Payer: Medicare Other | Admitting: General Practice

## 2016-07-31 DIAGNOSIS — Z5181 Encounter for therapeutic drug level monitoring: Secondary | ICD-10-CM

## 2016-07-31 DIAGNOSIS — Z86718 Personal history of other venous thrombosis and embolism: Secondary | ICD-10-CM

## 2016-07-31 LAB — POCT INR: INR: 1.4

## 2016-07-31 NOTE — Progress Notes (Signed)
I have reviewed and agree with the plan. 

## 2016-07-31 NOTE — Patient Instructions (Signed)
Pre visit review using our clinic review tool, if applicable. No additional management support is needed unless otherwise documented below in the visit note. 

## 2016-08-04 ENCOUNTER — Other Ambulatory Visit: Payer: Self-pay

## 2016-08-04 MED ORDER — COLESEVELAM HCL 625 MG PO TABS: 625.0000 mg | ORAL_TABLET | Freq: Two times a day (BID) | ORAL | 0 refills | Status: DC

## 2016-08-10 ENCOUNTER — Ambulatory Visit (INDEPENDENT_AMBULATORY_CARE_PROVIDER_SITE_OTHER): Payer: Medicare Other | Admitting: Family Medicine

## 2016-08-10 VITALS — BP 128/75 | HR 65

## 2016-08-10 DIAGNOSIS — E1169 Type 2 diabetes mellitus with other specified complication: Secondary | ICD-10-CM | POA: Diagnosis not present

## 2016-08-10 DIAGNOSIS — I10 Essential (primary) hypertension: Secondary | ICD-10-CM

## 2016-08-10 DIAGNOSIS — E669 Obesity, unspecified: Secondary | ICD-10-CM

## 2016-08-10 NOTE — Progress Notes (Signed)
Pre visit review using our clinic review tool, if applicable. No additional management support is needed unless otherwise documented below in the visit note.  Patient presents in office for blood pressure check & glucometer teaching. She voiced compliance with medication & regimen, except the Chlorthalidone. Patient reported that the medication made her extremely weak. She denies chest pain, numbness/tingling in limbs, headaches, dizziness & lightheadedness. The following readings were obtained: BP 128/75 P 65 & 02 97%.   Also, during the visit RN provided glucometer teaching. Patient brought in her glucometer from home with lancets & test strips. Demonstration given with teachback. Patient had three successful attempts with minimal guidance. The patient's granddaughter accompanied her to the visit & verbalized that she will assist at home if necessary. Prior to visit ending, RN issued patient some additional resources regarding the importance of blood sugar, carbohydrate counting, healthy portions, daily diabetes meal planning guide & self care diary to record blood sugars.  Informed patient of the provider's recommendations below: Per Dr. Charlett Blake: STOP taking Chlorthalidone. Continue all other medications & regimen. Also, check blood sugars every other day (include some fasting & after meal readings). Follow-up in 2-3 months with PCP.  Patient verbalized understanding & did not have any further questions or concerns.  RN blood pressure check note reviewed. Agree with documention and plan.

## 2016-08-10 NOTE — Patient Instructions (Addendum)
Per Dr. Charlett Blake: STOP taking Chlorthalidone. Continue all other medications & regimen. Also, check blood sugars every other day (include some fasting & after meal readings). Follow-up in 2-3 months with PCP.

## 2016-08-11 NOTE — Progress Notes (Signed)
RN blood pressure check note reviewed. Agree with documention and plan. 

## 2016-08-15 ENCOUNTER — Telehealth: Payer: Self-pay

## 2016-08-15 ENCOUNTER — Encounter (HOSPITAL_BASED_OUTPATIENT_CLINIC_OR_DEPARTMENT_OTHER): Payer: Self-pay | Admitting: Emergency Medicine

## 2016-08-15 ENCOUNTER — Emergency Department (HOSPITAL_BASED_OUTPATIENT_CLINIC_OR_DEPARTMENT_OTHER): Payer: Medicare Other

## 2016-08-15 ENCOUNTER — Observation Stay (HOSPITAL_BASED_OUTPATIENT_CLINIC_OR_DEPARTMENT_OTHER)
Admission: EM | Admit: 2016-08-15 | Discharge: 2016-08-17 | Disposition: A | Discharge status: Home / Self Care | Payer: Medicare Other | Attending: Internal Medicine | Admitting: Internal Medicine

## 2016-08-15 DIAGNOSIS — R6510 Systemic inflammatory response syndrome (SIRS) of non-infectious origin without acute organ dysfunction: Secondary | ICD-10-CM | POA: Diagnosis not present

## 2016-08-15 DIAGNOSIS — Z9841 Cataract extraction status, right eye: Secondary | ICD-10-CM | POA: Insufficient documentation

## 2016-08-15 DIAGNOSIS — Z961 Presence of intraocular lens: Secondary | ICD-10-CM | POA: Insufficient documentation

## 2016-08-15 DIAGNOSIS — Z79899 Other long term (current) drug therapy: Secondary | ICD-10-CM | POA: Insufficient documentation

## 2016-08-15 DIAGNOSIS — Z882 Allergy status to sulfonamides status: Secondary | ICD-10-CM | POA: Diagnosis not present

## 2016-08-15 DIAGNOSIS — W57XXXA Bitten or stung by nonvenomous insect and other nonvenomous arthropods, initial encounter: Secondary | ICD-10-CM | POA: Diagnosis not present

## 2016-08-15 DIAGNOSIS — D6851 Activated protein C resistance: Secondary | ICD-10-CM | POA: Diagnosis not present

## 2016-08-15 DIAGNOSIS — Z9889 Other specified postprocedural states: Secondary | ICD-10-CM | POA: Diagnosis not present

## 2016-08-15 DIAGNOSIS — Z6829 Body mass index (BMI) 29.0-29.9, adult: Secondary | ICD-10-CM | POA: Diagnosis not present

## 2016-08-15 DIAGNOSIS — I700 Atherosclerosis of aorta: Secondary | ICD-10-CM | POA: Diagnosis not present

## 2016-08-15 DIAGNOSIS — T148XXA Other injury of unspecified body region, initial encounter: Secondary | ICD-10-CM | POA: Insufficient documentation

## 2016-08-15 DIAGNOSIS — B9620 Unspecified Escherichia coli [E. coli] as the cause of diseases classified elsewhere: Secondary | ICD-10-CM | POA: Diagnosis not present

## 2016-08-15 DIAGNOSIS — E669 Obesity, unspecified: Secondary | ICD-10-CM | POA: Insufficient documentation

## 2016-08-15 DIAGNOSIS — Z90710 Acquired absence of both cervix and uterus: Secondary | ICD-10-CM | POA: Diagnosis not present

## 2016-08-15 DIAGNOSIS — I471 Supraventricular tachycardia, unspecified: Secondary | ICD-10-CM | POA: Diagnosis present

## 2016-08-15 DIAGNOSIS — R51 Headache: Secondary | ICD-10-CM | POA: Diagnosis not present

## 2016-08-15 DIAGNOSIS — Z803 Family history of malignant neoplasm of breast: Secondary | ICD-10-CM | POA: Diagnosis not present

## 2016-08-15 DIAGNOSIS — Z9081 Acquired absence of spleen: Secondary | ICD-10-CM | POA: Insufficient documentation

## 2016-08-15 DIAGNOSIS — A419 Sepsis, unspecified organism: Principal | ICD-10-CM | POA: Insufficient documentation

## 2016-08-15 DIAGNOSIS — Z96641 Presence of right artificial hip joint: Secondary | ICD-10-CM | POA: Insufficient documentation

## 2016-08-15 DIAGNOSIS — R002 Palpitations: Secondary | ICD-10-CM | POA: Diagnosis not present

## 2016-08-15 DIAGNOSIS — Z832 Family history of diseases of the blood and blood-forming organs and certain disorders involving the immune mechanism: Secondary | ICD-10-CM | POA: Diagnosis not present

## 2016-08-15 DIAGNOSIS — I10 Essential (primary) hypertension: Secondary | ICD-10-CM | POA: Diagnosis present

## 2016-08-15 DIAGNOSIS — N390 Urinary tract infection, site not specified: Secondary | ICD-10-CM | POA: Diagnosis not present

## 2016-08-15 DIAGNOSIS — Z9842 Cataract extraction status, left eye: Secondary | ICD-10-CM | POA: Insufficient documentation

## 2016-08-15 DIAGNOSIS — Z818 Family history of other mental and behavioral disorders: Secondary | ICD-10-CM | POA: Insufficient documentation

## 2016-08-15 DIAGNOSIS — Z7901 Long term (current) use of anticoagulants: Secondary | ICD-10-CM | POA: Insufficient documentation

## 2016-08-15 DIAGNOSIS — R918 Other nonspecific abnormal finding of lung field: Secondary | ICD-10-CM | POA: Diagnosis not present

## 2016-08-15 DIAGNOSIS — Z8249 Family history of ischemic heart disease and other diseases of the circulatory system: Secondary | ICD-10-CM | POA: Insufficient documentation

## 2016-08-15 DIAGNOSIS — Z881 Allergy status to other antibiotic agents status: Secondary | ICD-10-CM | POA: Insufficient documentation

## 2016-08-15 DIAGNOSIS — I119 Hypertensive heart disease without heart failure: Secondary | ICD-10-CM | POA: Diagnosis not present

## 2016-08-15 DIAGNOSIS — Z888 Allergy status to other drugs, medicaments and biological substances status: Secondary | ICD-10-CM | POA: Diagnosis not present

## 2016-08-15 LAB — URINALYSIS, ROUTINE W REFLEX MICROSCOPIC
Bilirubin Urine: NEGATIVE
Glucose, UA: NEGATIVE mg/dL
Ketones, ur: 40 mg/dL — AB
Nitrite: POSITIVE — AB
Protein, ur: NEGATIVE mg/dL
Specific Gravity, Urine: 1.021 (ref 1.005–1.030)
pH: 6 (ref 5.0–8.0)

## 2016-08-15 LAB — CBC WITH DIFFERENTIAL/PLATELET
Basophils Absolute: 0 10*3/uL (ref 0.0–0.1)
Basophils Relative: 1 %
Eosinophils Absolute: 0 10*3/uL (ref 0.0–0.7)
Eosinophils Relative: 0 %
HCT: 44.2 % (ref 36.0–46.0)
Hemoglobin: 15 g/dL (ref 12.0–15.0)
Lymphocytes Relative: 18 %
Lymphs Abs: 1 10*3/uL (ref 0.7–4.0)
MCH: 30.5 pg (ref 26.0–34.0)
MCHC: 33.9 g/dL (ref 30.0–36.0)
MCV: 90 fL (ref 78.0–100.0)
Monocytes Absolute: 0.4 10*3/uL (ref 0.1–1.0)
Monocytes Relative: 8 %
Neutro Abs: 4.1 10*3/uL (ref 1.7–7.7)
Neutrophils Relative %: 73 %
Platelets: 301 10*3/uL (ref 150–400)
RBC: 4.91 MIL/uL (ref 3.87–5.11)
RDW: 15.5 % (ref 11.5–15.5)
WBC: 5.5 10*3/uL (ref 4.0–10.5)

## 2016-08-15 LAB — URINALYSIS, MICROSCOPIC (REFLEX)

## 2016-08-15 LAB — COMPREHENSIVE METABOLIC PANEL
ALT: 20 U/L (ref 14–54)
AST: 34 U/L (ref 15–41)
Albumin: 4.1 g/dL (ref 3.5–5.0)
Alkaline Phosphatase: 62 U/L (ref 38–126)
Anion gap: 10 (ref 5–15)
BUN: 17 mg/dL (ref 6–20)
CO2: 24 mmol/L (ref 22–32)
Calcium: 9.1 mg/dL (ref 8.9–10.3)
Chloride: 102 mmol/L (ref 101–111)
Creatinine, Ser: 0.93 mg/dL (ref 0.44–1.00)
GFR calc Af Amer: >60 mL/min (ref >60)
GFR calc non Af Amer: 57 mL/min — ABNORMAL LOW (ref >60)
Glucose, Bld: 123 mg/dL — ABNORMAL HIGH (ref 65–99)
Potassium: 3.7 mmol/L (ref 3.5–5.1)
Sodium: 136 mmol/L (ref 135–145)
Total Bilirubin: 0.7 mg/dL (ref 0.3–1.2)
Total Protein: 8.1 g/dL (ref 6.5–8.1)

## 2016-08-15 LAB — PROTIME-INR
INR: 2.11
Prothrombin Time: 24 seconds — ABNORMAL HIGH (ref 11.4–15.2)

## 2016-08-15 LAB — LACTIC ACID, PLASMA: Lactic Acid, Venous: 1.2 mmol/L (ref 0.5–1.9)

## 2016-08-15 LAB — I-STAT CG4 LACTIC ACID, ED: Lactic Acid, Venous: 0.96 mmol/L (ref 0.5–1.9)

## 2016-08-15 LAB — TROPONIN I: Troponin I: <0.03 ng/mL (ref <0.03)

## 2016-08-15 MED ORDER — WARFARIN SODIUM 5 MG PO TABS
5.0000 mg | ORAL_TABLET | Freq: Once | ORAL | Status: AC
  Administered 2016-08-15: 5 mg via ORAL
  Filled 2016-08-15: qty 1

## 2016-08-15 MED ORDER — LOSARTAN POTASSIUM 50 MG PO TABS
100.0000 mg | ORAL_TABLET | Freq: Every day | ORAL | Status: DC
  Administered 2016-08-16 – 2016-08-17 (×2): 100 mg via ORAL
  Filled 2016-08-15 (×2): qty 2

## 2016-08-15 MED ORDER — ALPRAZOLAM 0.25 MG PO TABS: 0.2500 mg | ORAL_TABLET | Freq: Two times a day (BID) | ORAL | Status: DC | PRN

## 2016-08-15 MED ORDER — ACETAMINOPHEN 650 MG RE SUPP: 650.0000 mg | Freq: Four times a day (QID) | RECTAL | Status: DC | PRN

## 2016-08-15 MED ORDER — ONDANSETRON HCL 4 MG PO TABS: 4.0000 mg | ORAL_TABLET | Freq: Four times a day (QID) | ORAL | Status: DC | PRN

## 2016-08-15 MED ORDER — SODIUM CHLORIDE 0.9 % IV SOLN
3.0000 g | Freq: Three times a day (TID) | INTRAVENOUS | Status: DC
  Administered 2016-08-16 – 2016-08-17 (×5): 3 g via INTRAVENOUS
  Filled 2016-08-15 (×6): qty 3

## 2016-08-15 MED ORDER — ONDANSETRON HCL 4 MG/2ML IJ SOLN: 4.0000 mg | Freq: Four times a day (QID) | INTRAMUSCULAR | Status: DC | PRN

## 2016-08-15 MED ORDER — ACETAMINOPHEN 500 MG PO TABS
1000.0000 mg | ORAL_TABLET | Freq: Once | ORAL | Status: AC
  Administered 2016-08-15: 1000 mg via ORAL
  Filled 2016-08-15: qty 2

## 2016-08-15 MED ORDER — AMPICILLIN-SULBACTAM SODIUM 3 (2-1) G IJ SOLR
3.0000 g | Freq: Once | INTRAMUSCULAR | Status: AC
  Administered 2016-08-15: 3 g via INTRAVENOUS
  Filled 2016-08-15: qty 3

## 2016-08-15 MED ORDER — SODIUM CHLORIDE 0.9 % IV SOLN
INTRAVENOUS | Status: DC
  Administered 2016-08-15 – 2016-08-16 (×2): via INTRAVENOUS

## 2016-08-15 MED ORDER — COLESEVELAM HCL 625 MG PO TABS
625.0000 mg | ORAL_TABLET | Freq: Two times a day (BID) | ORAL | Status: DC
  Administered 2016-08-16 – 2016-08-17 (×3): 625 mg via ORAL
  Filled 2016-08-15 (×4): qty 1

## 2016-08-15 MED ORDER — WARFARIN - PHARMACIST DOSING INPATIENT: Freq: Every day | Status: DC

## 2016-08-15 MED ORDER — SODIUM CHLORIDE 0.9 % IV BOLUS (SEPSIS)
1000.0000 mL | Freq: Once | INTRAVENOUS | Status: AC
  Administered 2016-08-15: 1000 mL via INTRAVENOUS

## 2016-08-15 MED ORDER — ACETAMINOPHEN 325 MG PO TABS
650.0000 mg | ORAL_TABLET | Freq: Four times a day (QID) | ORAL | Status: DC | PRN
  Administered 2016-08-16 (×2): 650 mg via ORAL
  Filled 2016-08-15 (×2): qty 2

## 2016-08-15 MED ORDER — DEXTROSE 5 % IV SOLN
100.0000 mg | Freq: Two times a day (BID) | INTRAVENOUS | Status: DC
  Administered 2016-08-15 – 2016-08-16 (×2): 100 mg via INTRAVENOUS
  Filled 2016-08-15 (×2): qty 100

## 2016-08-15 NOTE — ED Triage Notes (Signed)
Pt sent from PCP upstairs c/o headache and generalized weakness since yesterday. Denies chest pain, SOB, dizziness, blurred vision. Pt also reports palpitations this morning. PCP reported a fever in the office.

## 2016-08-15 NOTE — Progress Notes (Signed)
ANTICOAGULATION CONSULT NOTE - Initial Consult  Pharmacy Consult for warfarin Indication: factor V Leiden deficiency   Allergies  Allergen Reactions  . Amlodipine Swelling    "my whole body swelled"  . Carvedilol Swelling    "my whole body swelled"  . Cefuroxime Axetil     REACTION: Ddoes not remember reaction  . Chlorthalidone     Patient reported extreme weakness.  . Statins     Weakness, pain Lipitor and Pravachol  . Sulfonamide Derivatives     REACTION: Rash    Patient Measurements: Height: 5\' 3"  (160 cm) Weight: 166 lb 7.2 oz (75.5 kg) IBW/kg (Calculated) : 52.4   Vital Signs: Temp: 99.6 F (37.6 C) (07/17 2040) Temp Source: Oral (07/17 2040) BP: 147/54 (07/17 2040) Pulse Rate: 72 (07/17 2040)  Labs:  Recent Labs  08/15/16 1616 08/15/16 1655  HGB 15.0  --   HCT 44.2  --   PLT 301  --   LABPROT 24.0*  --   INR 2.11  --   CREATININE 0.93  --   TROPONINI  --  <0.03    Estimated Creatinine Clearance: 46.9 mL/min (by C-G formula based on SCr of 0.93 mg/dL).   Medical History: Past Medical History:  Diagnosis Date  . Anxiety state, unspecified 09/22/2013  . Arthritis    lumbar stenosis, radiculopathy, OA- L hip  . BCC (basal cell carcinoma), scalp/neck 09/27/2015  . Blood clotting disorder (HCC)    V5 factor  . Blood dyscrasia    factor 5 deficiency  . Clostridium difficile infection   . Clotting disorder ([place])   . Diabetes mellitus type 2 in obese ([place]) 02/26/2014  . DVT (deep venous thrombosis) ([place])   . Factor V deficiency ([place])   . Hemorrhoids   . Hyperglycemia 02/26/2014  . Hyperlipemia   . Hypertension   . Infiltrating ductal carcinoma of breast ([place])    stage one right   . Low back pain 03/26/2014  . Macular degeneration   . Mixed hyperlipidemia 05/18/2015  . Nocturia 02/26/2014  . Paroxysmal supraventricular tachycardia ([place])   . Personal history of colonic polyps    hyperplastic  . Thoracic aortic aneurysm ([place])   . TMJ pain  dysfunction syndrome      Assessment: 83 year old female with diabetes mellitus, factor V leiden on warfarin who admitted with symptoms of dysuria, headaches and generalized weakness.  Pharmacy consulted to dose warfarin.  Home dose 5mg  daily, except 2.5mg  on Monday, last dose 7/16 @ 1900  08/15/2016 INR 2.11, therapeutic H/H WNL Plts WNL DI: pt started on doxycycline (for tick exposure), may increase hypoprothrombinemic effects of warfarin  Goal of Therapy:  INR 2-3    Plan:  Warfarin 5mg  po x 1 Daily INR  Dolly Rias RPh 08/15/2016, 10:54 PM Pager [phone]

## 2016-08-15 NOTE — Progress Notes (Signed)
transfer from [facility] :  Referring MD: Dr Regenia Skeeter  83 year old obese female with diabetes mellitus, factor V leiden on warfarin who was having symptoms of dysuria, headaches and generalized weakness. She saw her PCP today and was found to have fever of 101F and tachycardia. Sent to [facility]. Headache since yesterday but no neck stiffness. Has chronic bilateral leg swelling which is worse since this morning. Reported some palpitations. No cough, nausea, vomiting, chest pain, shortness of breath abdominal pain or diarrhea.  In the ED patient was found to have SIRS with fever of 103F, tachycardic to 130s and tachypnea. CBC and chemistry including lactic acid was normal. INR therapeutic. UA suggestive of UTI. EKG was sinus rhythm initially but showed some narrow complex tachycardia on the monitor. Received 1 L IV fluids, cultures sent and started on empiric Unasyn. Patient still tachycardic to 120s-130s. Receiving another fluid bolus.  accepted to telemetry bed at [facility].

## 2016-08-15 NOTE — Telephone Encounter (Signed)
Patient evaluated and she continued to feel poorly with pulse in the 140s. Phone call is placed to MD in ER and patient is transported downstairs for further care due to tachycardia and fever

## 2016-08-15 NOTE — Telephone Encounter (Signed)
Patient in to accompany  husband for Dr. Appointment today. States she feels weakness, has had headache all night. Has pain in back of head and neck. States her heart is racing fast periodically. Feels medication she started last Saturday is the cause of her symptoms.   BP = 133/85  P= 142  T= 101.0   Notified Dr. Charlett Blake who assessed patient as well. States patient  will need to go to ED due to her elevated heart rate and fever.  Patient accompanied to ED by [facility].

## 2016-08-15 NOTE — Progress Notes (Signed)
Pharmacy Antibiotic Note  [name] Little is a 83 y.o. female admitted on 08/15/2016 with UTI.  Pharmacy has been consulted for unasyn dosing. Tmax is 103.1 and WBC is WNL. Scr and LA are WNL.   Plan: Unasyn 3gm IV Q8H F/u renal fxn, C&S, clinical status  Weight: 175 lb (79.4 kg)  Temp (24hrs), Avg:103.1 F (39.5 C), Min:103.1 F (39.5 C), Max:103.1 F (39.5 C)   Recent Labs Lab 08/15/16 1616 08/15/16 1628  WBC 5.5  --   CREATININE 0.93  --   LATICACIDVEN  --  0.96    Estimated Creatinine Clearance: 48.1 mL/min (by C-G formula based on SCr of 0.93 mg/dL).    Allergies  Allergen Reactions  . Amlodipine Swelling    "my whole body swelled"  . Carvedilol Swelling    "my whole body swelled"  . Cefuroxime Axetil     REACTION: Ddoes not remember reaction  . Chlorthalidone     Patient reported extreme weakness.  . Statins     Weakness, pain Lipitor and Pravachol  . Sulfonamide Derivatives     REACTION: Rash    Antimicrobials this admission: Unasyn 7/17>>  Dose adjustments this admission: N/A  Microbiology results: Pending  Thank you for allowing pharmacy to be a part of this patient's care.  Jerardo Costabile, Rande Lawman 08/15/2016 5:20 PM

## 2016-08-15 NOTE — H&P (Signed)
History and Physical    [name] Little TML:[id number] DOB: Nov 26, 1933 DOA: 08/15/2016  PCP: Mosie Lukes, MD  Patient coming from: Home.  Chief Complaint: Headache and dysuria.  HPI: [name] Little is a 83 y.o. female with history of factor V Leiden deficiency, hypertension has been experiencing occipital headache with dysuria for last 2 days. Patient also has been having palpitations with subjective feeling of fever chills. Patient had gone to her PCPs office and was referred to the ER. Denies any nausea vomiting abdominal pain chest pain shortness of breath or diarrhea. Patient states she did work in the yard and had tick bites 4 days ago.  ED Course: In the ER patient was found to be tachycardic with fever of 102F. UA was compatible with UTI. Patient was started on empiric antibiotics blood cultures were sent and admitted for further management. A UA showing tachycardia. Chest x-ray was unremarkable.  Review of Systems: As per HPI, rest all negative.   Past Medical History:  Diagnosis Date  . Anxiety state, unspecified 09/22/2013  . Arthritis    lumbar stenosis, radiculopathy, OA- L hip  . BCC (basal cell carcinoma), scalp/neck 09/27/2015  . Blood clotting disorder (HCC)    V5 factor  . Blood dyscrasia    factor 5 deficiency  . Clostridium difficile infection   . Clotting disorder ([place])   . Diabetes mellitus type 2 in obese ([place]) 02/26/2014  . DVT (deep venous thrombosis) ([place])   . Factor V deficiency ([place])   . Hemorrhoids   . Hyperglycemia 02/26/2014  . Hyperlipemia   . Hypertension   . Infiltrating ductal carcinoma of breast ([place])    stage one right   . Low back pain 03/26/2014  . Macular degeneration   . Mixed hyperlipidemia 05/18/2015  . Nocturia 02/26/2014  . Paroxysmal supraventricular tachycardia ([place])   . Personal history of colonic polyps    hyperplastic  . Thoracic aortic aneurysm ([place])   . TMJ pain dysfunction syndrome     Past Surgical History:    Procedure Laterality Date  . ABDOMINAL HYSTERECTOMY     1960- ? vaginal hysterectomy, not laparoscopic   . BREAST LUMPECTOMY  2010   right Dr. [name]  . COLONOSCOPY    . EYE SURGERY     bilateral cataracts- /w IOL   . JOINT REPLACEMENT     R hip replacement   . LAPAROSCOPIC HYSTERECTOMY    . LUMBAR LAMINECTOMY/DECOMPRESSION MICRODISCECTOMY  04/11/2011   Procedure: LUMBAR LAMINECTOMY/DECOMPRESSION MICRODISCECTOMY;  Surgeon: [name], MD;  Location: [facility];  Service: Neurosurgery;  Laterality: N/A;  Lumbar Five-Sacral One Microdiscectomy  . right calf surgery     for cancer  . SPLENECTOMY    . TOTAL HIP ARTHROPLASTY  1999   right  . UPPER GASTROINTESTINAL ENDOSCOPY       reports that she has never smoked. She has never used smokeless tobacco. She reports that she does not drink alcohol or use drugs.  Allergies  Allergen Reactions  . Amlodipine Swelling    "my whole body swelled"  . Carvedilol Swelling    "my whole body swelled"  . Cefuroxime Axetil     REACTION: Ddoes not remember reaction  . Chlorthalidone     Patient reported extreme weakness.  . Statins     Weakness, pain Lipitor and Pravachol  . Sulfonamide Derivatives     REACTION: Rash    Family History  Problem Relation Age of Onset  . Heart disease Mother   .  Heart disease Father   . Alzheimer's disease Sister   . Heart disease Unknown        uncles  . Clotting disorder Maternal Uncle   . Breast cancer Paternal Aunt   . Cancer Paternal Aunt        breast  . Colon cancer Neg Hx   . Anesthesia problems Neg Hx   . Hypotension Neg Hx   . Malignant hyperthermia Neg Hx   . Pseudochol deficiency Neg Hx     Prior to Admission medications   Medication Sig Start Date End Date Taking? Authorizing Provider  B Complex Vitamins (VITAMIN B COMPLEX PO) Take 1 tablet by mouth daily.    Yes [provider]  colesevelam (WELCHOL) 625 MG tablet Take 1 tablet (625 mg total) by mouth 2 (two) times  daily with a meal. 08/04/16  Yes Mosie Lukes, MD  furosemide (LASIX) 20 MG tablet Take 1 tablet (20 mg total) by mouth daily as needed for fluid or edema. 08/09/15  Yes Mosie Lukes, MD  losartan (COZAAR) 100 MG tablet TAKE 1 TABLET (100 MG TOTAL) BY MOUTH DAILY. 03/02/16  Yes Mosie Lukes, MD  Multiple Vitamins-Minerals (MULTIVITAMIN ADULTS) TABS Take 1 tablet by mouth daily.   Yes [provider]  potassium chloride SA (K-DUR,KLOR-CON) 20 MEQ tablet Take 1 tablet (20 mEq total) by mouth daily as needed (lasix use). 12/28/15  Yes Mosie Lukes, MD  warfarin (COUMADIN) 5 MG tablet TAKE AS DIRECTED BY COUMADIN CLINIC. Patient taking differently: Take 1 tablet (5 mg) by mouth daily except on Mon Take 0.5 tablet (2.5 mg) 03/23/16  Yes Mosie Lukes, MD  ALPRAZolam Duanne Moron) 0.25 MG tablet TAKE ONE TABLET BY MOUTH TWICE DIALY AS NEEDED FOR ANXIETY 05/11/16   Mosie Lukes, MD  glucose blood (IGLUCOSE TEST STRIPS) test strip Test Blood Sugars every day as needed 07/25/16   Mosie Lukes, MD  glucose monitoring kit (FREESTYLE) monitoring kit 1 each by Does not apply route as needed for other. 07/25/16   Mosie Lukes, MD  Lancets MISC Test Blood Sugars every day as needed 07/25/16   Mosie Lukes, MD    Physical Exam: Vitals:   08/15/16 1800 08/15/16 1830 08/15/16 1900 08/15/16 2040  BP: (!) 121/53  (!) 113/52 (!) 147/54  Pulse: 85  (!) 56 72  Resp: (!) '21  17 18  ' Temp:  100.2 F (37.9 C)  99.6 F (37.6 C)  TempSrc:  Oral  Oral  SpO2: 92%  94% 97%  Weight:    75.5 kg (166 lb 7.2 oz)  Height:    '5\' 3"'  (1.6 m)      Constitutional: Moderately built and nourished. Vitals:   08/15/16 1800 08/15/16 1830 08/15/16 1900 08/15/16 2040  BP: (!) 121/53  (!) 113/52 (!) 147/54  Pulse: 85  (!) 56 72  Resp: (!) '21  17 18  ' Temp:  100.2 F (37.9 C)  99.6 F (37.6 C)  TempSrc:  Oral  Oral  SpO2: 92%  94% 97%  Weight:    75.5 kg (166 lb 7.2 oz)  Height:    '5\' 3"'  (1.6 m)   Eyes:  Anicteric no pallor. ENMT: No discharge from the ears eyes nose and mouth. Neck: No neck rigidity no mass felt. Respiratory: No rhonchi or crepitations. Cardiovascular: S1-S2 heard no murmurs appreciated. Abdomen: Soft nontender bowel sounds present. No guarding or rigidity. Musculoskeletal: No edema. No joint effusion. Skin: No rash. Skin  appears warm. Neurologic: Alert awake oriented to time place and person. Moves all extremities. Psychiatric: Appears normal. Normal affect.   Labs on Admission: I have personally reviewed following labs and imaging studies  CBC:  Recent Labs Lab 08/15/16 1616  WBC 5.5  NEUTROABS 4.1  HGB 15.0  HCT 44.2  MCV 90.0  PLT 395   Basic Metabolic Panel:  Recent Labs Lab 08/15/16 1616  NA 136  K 3.7  CL 102  CO2 24  GLUCOSE 123*  BUN 17  CREATININE 0.93  CALCIUM 9.1   GFR: Estimated Creatinine Clearance: 46.9 mL/min (by C-G formula based on SCr of 0.93 mg/dL). Liver Function Tests:  Recent Labs Lab 08/15/16 1616  AST 34  ALT 20  ALKPHOS 62  BILITOT 0.7  PROT 8.1  ALBUMIN 4.1   No results for input(s): LIPASE, AMYLASE in the last 168 hours. No results for input(s): AMMONIA in the last 168 hours. Coagulation Profile:  Recent Labs Lab 08/15/16 1616  INR 2.11   Cardiac Enzymes:  Recent Labs Lab 08/15/16 1655  TROPONINI <0.03   BNP (last 3 results) No results for input(s): PROBNP in the last 8760 hours. HbA1C: No results for input(s): HGBA1C in the last 72 hours. CBG: No results for input(s): GLUCAP in the last 168 hours. Lipid Profile: No results for input(s): CHOL, HDL, LDLCALC, TRIG, CHOLHDL, LDLDIRECT in the last 72 hours. Thyroid Function Tests: No results for input(s): TSH, T4TOTAL, FREET4, T3FREE, THYROIDAB in the last 72 hours. Anemia Panel: No results for input(s): VITAMINB12, FOLATE, FERRITIN, TIBC, IRON, RETICCTPCT in the last 72 hours. Urine analysis:    Component Value Date/Time   COLORURINE YELLOW  08/15/2016 1619   APPEARANCEUR CLEAR 08/15/2016 1619   LABSPEC 1.021 08/15/2016 1619   PHURINE 6.0 08/15/2016 1619   GLUCOSEU NEGATIVE 08/15/2016 1619   GLUCOSEU NEGATIVE 09/27/2015 1437   HGBUR TRACE (A) 08/15/2016 1619   BILIRUBINUR NEGATIVE 08/15/2016 1619   KETONESUR 40 (A) 08/15/2016 1619   PROTEINUR NEGATIVE 08/15/2016 1619   UROBILINOGEN 0.2 09/27/2015 1437   NITRITE POSITIVE (A) 08/15/2016 1619   LEUKOCYTESUR SMALL (A) 08/15/2016 1619   Sepsis Labs: '@LABRCNTIP' (procalcitonin:4,lacticidven:4) )No results found for this or any previous visit (from the past 240 hour(s)).   Radiological Exams on Admission: Dg Chest 2 View  Result Date: 08/15/2016 CLINICAL DATA:  Initial evaluation for suspected sepsis. EXAM: CHEST  2 VIEW COMPARISON:  Prior radiograph from 03/31/2011. FINDINGS: Stable cardiomegaly. Mediastinal silhouette within normal limits. Aortic atherosclerosis. Lungs normally inflated. No pulmonary edema or pleural effusion. Hazy and streaky left basilar opacity, which may reflect infiltrate or subsegmental atelectasis. No other focal airspace disease. No pneumothorax. No acute osseus abnormality. IMPRESSION: 1. Streaky/hazy left basilar opacity. While this finding may in part reflect subsegmental atelectasis, possible infiltrate could be considered in the correct clinical setting. 2. Stable cardiomegaly without pulmonary edema. 3. Aortic atherosclerosis. Electronically Signed   By: Jeannine Boga M.D.   On: 08/15/2016 17:09    EKG: Independently reviewed. Patient had 3 EKGs done. I reviewed these EKGs with on-call cardiologist. As per the cardiologist the first EKG was showing tachycardia which is sinus tachycardia with prolonged PR interval.  Assessment/Plan Active Problems:   Essential hypertension   Paroxysmal supraventricular tachycardia (HCC)   FH: factor V Leiden mutation   Acute UTI   SIRS (systemic inflammatory response syndrome) ([place])    1. SIRS with  possible developing sepsis from UTI - follow cultures. Continue with hydration. Patient is on ceftriaxone. I  also place patient on doxycycline secondary to tick exposure. And due to the same reason we will get RMSF and Lyme titer. 2. Tachycardia and palpitations - reviewed EKG with cardiologist on call. Cardiologist. Patient has sinus tachycardia with prolonged PR interval. Continue to monitor in telemetry. Check TSH. 3. Factor V Leyden deficiency on Coumadin. 4. Hypertension on Cozaar.  I have reviewed patient's old charts last. I have discussed with on-call cardiologist.   DVT prophylaxis: Coumadin. Code Status: Full code.  Family Communication: Patient's son and granddaughter.  Disposition Plan: Home.  Consults called: None.  Admission status: Inpatient.    Rise Patience MD [facility] Pager [phone].  If 7PM-7AM, please contact night-coverage [email or web address] Password [facility]  08/15/2016, 10:48 PM

## 2016-08-15 NOTE — ED Provider Notes (Signed)
[facility] DEPT [facility] Provider Note   CSN: [id number] Arrival date & time: 08/15/16  1608     History   Chief Complaint Chief Complaint  Patient presents with  . Fever    HPI [name] Little is a 83 y.o. female.  HPI  83 year old female sent from her PCP clinic with tachycardia and fever. Patient states that she is not feeling well since yesterday afternoon. She started to develop a headache yesterday afternoon/last night that has gradually worsened and is now an 8 or 9 out of 10. It is occipital with some milder neck pain. There is no neck stiffness. No injuries. She did not know she had a fever until she got to the clinic today and they checked her temperature and it was 101. She took Tylenol last night with some partial relief. No Tylenol today. She is on warfarin for factor V Leiden and prior DVT. She has chronic bilateral leg swelling that is a little worse bilaterally this morning. She feels generalized fatigue and weakness and has trouble getting up and walking due to this weakness. There is no unilateral weakness or numbness. She denies any chest pain, cough, sore throat, rhinorrhea, shortness of breath, or abdominal pain. She denies dysuria but states she's had urinary frequency and urgency for about 24 hours. No back pain. She is having some palpitations where she can feel her heart racing but it does not seem to be skipping beats. This started this morning.  Past Medical History:  Diagnosis Date  . Anxiety state, unspecified 09/22/2013  . Arthritis    lumbar stenosis, radiculopathy, OA- L hip  . BCC (basal cell carcinoma), scalp/neck 09/27/2015  . Blood clotting disorder (HCC)    V5 factor  . Blood dyscrasia    factor 5 deficiency  . Clostridium difficile infection   . Clotting disorder ([place])   . Diabetes mellitus type 2 in obese ([place]) 02/26/2014  . DVT (deep venous thrombosis) ([place])   . Factor V deficiency ([place])   . Hemorrhoids   . Hyperglycemia 02/26/2014  .  Hyperlipemia   . Hypertension   . Infiltrating ductal carcinoma of breast ([place])    stage one right   . Low back pain 03/26/2014  . Macular degeneration   . Mixed hyperlipidemia 05/18/2015  . Nocturia 02/26/2014  . Paroxysmal supraventricular tachycardia ([place])   . Personal history of colonic polyps    hyperplastic  . Thoracic aortic aneurysm ([place])   . TMJ pain dysfunction syndrome     Patient Active Problem List   Diagnosis Date Noted  . SIRS (systemic inflammatory response syndrome) ([place]) 08/15/2016  . Acute UTI 10/10/2015  . BCC (basal cell carcinoma), scalp/neck 09/27/2015  . Pain in limb 05/30/2015  . Low back pain 03/26/2014  . Diabetes mellitus type 2 in obese ([place]) 02/26/2014  . Nocturia 02/26/2014  . Obesity (BMI 30-39.9) 02/03/2014  . Anxiety state 09/22/2013  . FH: factor V Leiden mutation 08/19/2013  . Healthcare maintenance 04/02/2013  . Encounter for therapeutic drug monitoring 03/26/2013  . Anticoagulant long-term use 08/23/2010  . CALLUS, TOE 01/05/2010  . PALPITATIONS 08/23/2009  . CHEST PAIN 08/23/2009  . hx: breast cancer, IDC, right LOQ, receptor + her 2 - 07/27/2009  . FACTOR V DEFICIENCY 07/27/2009  . PHLEBITIS&THROMBOPHLEB SUP VESSELS LOWER EXTREM 12/11/2007  . HEMORRHOIDS 12/09/2007  . COLONIC POLYPS, HX OF 12/09/2007  . Essential hypertension 09/18/2007  . Venous (peripheral) insufficiency 07/02/2007  . Hyperlipidemia 06/04/2007  . Paroxysmal supraventricular tachycardia ([place]) 03/28/2007  .  ANEURYSM, THORACIC AORTIC 11/25/2006  . DVT, HX OF 08/11/2006    Past Surgical History:  Procedure Laterality Date  . ABDOMINAL HYSTERECTOMY     1960- ? vaginal hysterectomy, not laparoscopic   . BREAST LUMPECTOMY  2010   right Dr. [name]  . COLONOSCOPY    . EYE SURGERY     bilateral cataracts- /w IOL   . JOINT REPLACEMENT     R hip replacement   . LAPAROSCOPIC HYSTERECTOMY    . LUMBAR LAMINECTOMY/DECOMPRESSION MICRODISCECTOMY  04/11/2011   Procedure:  LUMBAR LAMINECTOMY/DECOMPRESSION MICRODISCECTOMY;  Surgeon: [name], MD;  Location: [facility];  Service: Neurosurgery;  Laterality: N/A;  Lumbar Five-Sacral One Microdiscectomy  . right calf surgery     for cancer  . SPLENECTOMY    . TOTAL HIP ARTHROPLASTY  1999   right  . UPPER GASTROINTESTINAL ENDOSCOPY      OB History    No data available       Home Medications    Prior to Admission medications   Medication Sig Start Date End Date Taking? Authorizing Provider  B Complex Vitamins (VITAMIN B COMPLEX PO) Take 1 tablet by mouth daily.    Yes [provider]  colesevelam (WELCHOL) 625 MG tablet Take 1 tablet (625 mg total) by mouth 2 (two) times daily with a meal. 08/04/16  Yes Mosie Lukes, MD  furosemide (LASIX) 20 MG tablet Take 1 tablet (20 mg total) by mouth daily as needed for fluid or edema. 08/09/15  Yes Mosie Lukes, MD  losartan (COZAAR) 100 MG tablet TAKE 1 TABLET (100 MG TOTAL) BY MOUTH DAILY. 03/02/16  Yes Mosie Lukes, MD  Multiple Vitamins-Minerals (MULTIVITAMIN ADULTS) TABS Take 1 tablet by mouth daily.   Yes [provider]  potassium chloride SA (K-DUR,KLOR-CON) 20 MEQ tablet Take 1 tablet (20 mEq total) by mouth daily as needed (lasix use). 12/28/15  Yes Mosie Lukes, MD  warfarin (COUMADIN) 5 MG tablet TAKE AS DIRECTED BY COUMADIN CLINIC. Patient taking differently: Take 1 tablet (5 mg) by mouth daily except on Mon Take 0.5 tablet (2.5 mg) 03/23/16  Yes Mosie Lukes, MD  ALPRAZolam Duanne Moron) 0.25 MG tablet TAKE ONE TABLET BY MOUTH TWICE DIALY AS NEEDED FOR ANXIETY 05/11/16   Mosie Lukes, MD  glucose blood (IGLUCOSE TEST STRIPS) test strip Test Blood Sugars every day as needed 07/25/16   Mosie Lukes, MD  glucose monitoring kit (FREESTYLE) monitoring kit 1 each by Does not apply route as needed for other. 07/25/16   Mosie Lukes, MD  Lancets MISC Test Blood Sugars every day as needed 07/25/16   Mosie Lukes, MD    Family  History Family History  Problem Relation Age of Onset  . Heart disease Mother   . Heart disease Father   . Alzheimer's disease Sister   . Heart disease Unknown        uncles  . Clotting disorder Maternal Uncle   . Breast cancer Paternal Aunt   . Cancer Paternal Aunt        breast  . Colon cancer Neg Hx   . Anesthesia problems Neg Hx   . Hypotension Neg Hx   . Malignant hyperthermia Neg Hx   . Pseudochol deficiency Neg Hx     Social History Social History  Substance Use Topics  . Smoking status: Never Smoker  . Smokeless tobacco: Never Used  . Alcohol use No     Allergies   Amlodipine;  Carvedilol; Cefuroxime axetil; Chlorthalidone; Statins; and Sulfonamide derivatives   Review of Systems Review of Systems  Constitutional: Positive for fatigue. Negative for chills and fever.  Eyes: Negative for visual disturbance.  Respiratory: Negative for cough and shortness of breath.   Cardiovascular: Positive for palpitations and leg swelling. Negative for chest pain.  Gastrointestinal: Negative for abdominal pain, diarrhea, nausea and vomiting.  Genitourinary: Positive for frequency and urgency. Negative for dysuria and hematuria.  Musculoskeletal: Negative for back pain.  Neurological: Positive for headaches. Negative for dizziness, weakness, light-headedness and numbness.  All other systems reviewed and are negative.    Physical Exam Updated Vital Signs BP (!) 147/54 (BP Location: Right Arm)   Pulse 72   Temp 99.6 F (37.6 C) (Oral)   Resp 18   Ht '5\' 3"'  (1.6 m)   Wt 75.5 kg (166 lb 7.2 oz)   SpO2 97%   BMI 29.48 kg/m   Physical Exam  Constitutional: She is oriented to person, place, and time. She appears well-developed and well-nourished. No distress.  HENT:  Head: Normocephalic and atraumatic.  Right Ear: External ear normal.  Left Ear: External ear normal.  Nose: Nose normal.  No occipital tenderness or swelling  Eyes: Pupils are equal, round, and reactive to  light. EOM are normal. Right eye exhibits no discharge. Left eye exhibits no discharge.  Neck: Normal range of motion. Neck supple.  Cardiovascular: Regular rhythm and normal heart sounds.  Tachycardia present.   Pulmonary/Chest: Effort normal and breath sounds normal.  Abdominal: Soft. She exhibits no distension. There is tenderness in the suprapubic area.  Neurological: She is alert and oriented to person, place, and time.  CN 3-12 grossly intact. 5/5 strength in all 4 extremities. Grossly normal sensation.  Skin: Skin is warm and dry. She is not diaphoretic.  Nursing note and vitals reviewed.    ED Treatments / Results  Labs (all labs ordered are listed, but only abnormal results are displayed) Labs Reviewed  COMPREHENSIVE METABOLIC PANEL - Abnormal; Notable for the following:       Result Value   Glucose, Bld 123 (*)    GFR calc non Af Amer 57 (*)    All other components within normal limits  PROTIME-INR - Abnormal; Notable for the following:    Prothrombin Time 24.0 (*)    All other components within normal limits  URINALYSIS, ROUTINE W REFLEX MICROSCOPIC - Abnormal; Notable for the following:    Hgb urine dipstick TRACE (*)    Ketones, ur 40 (*)    Nitrite POSITIVE (*)    Leukocytes, UA SMALL (*)    All other components within normal limits  URINALYSIS, MICROSCOPIC (REFLEX) - Abnormal; Notable for the following:    Bacteria, UA MANY (*)    Squamous Epithelial / LPF 0-5 (*)    All other components within normal limits  CULTURE, BLOOD (ROUTINE X 2)  CULTURE, BLOOD (ROUTINE X 2)  URINE CULTURE  CBC WITH DIFFERENTIAL/PLATELET  TROPONIN I  LACTIC ACID, PLASMA  ROCKY MTN SPOTTED FVR ABS PNL(IGG+IGM)  B. BURGDORFI ANTIBODIES  COMPREHENSIVE METABOLIC PANEL  CBC WITH DIFFERENTIAL/PLATELET  PROTIME-INR  I-STAT CG4 LACTIC ACID, ED    EKG  EKG Interpretation  Date/Time:  Tuesday August 15 2016 16:15:46 EDT Ventricular Rate:  135 PR Interval:    QRS Duration: 79 QT  Interval:  309 QTC Calculation: 464 R Axis:   -62 Text Interpretation:  regulary narrow complex tachycardia, no definitive P waves, possibly atrial flutter Left anterior  fascicular block Abnormal R-wave progression, late transition ST depression, probably rate related changes noted compared to 2015 Confirmed by Sherwood Gambler [phone]) on 08/15/2016 4:23:56 PM       EKG Interpretation  Date/Time:  Tuesday August 15 2016 16:45:36 EDT Ventricular Rate:  90 PR Interval:    QRS Duration: 84 QT Interval:  335 QTC Calculation: 410 R Axis:   -56 Text Interpretation:  Sinus rhythm Left anterior fascicular block Abnormal R-wave progression, late transition narrow complex arrhythmia has resolved compared to earlier in the day Confirmed by Sherwood Gambler [phone]) on 08/15/2016 4:58:52 PM        Radiology Dg Chest 2 View  Result Date: 08/15/2016 CLINICAL DATA:  Initial evaluation for suspected sepsis. EXAM: CHEST  2 VIEW COMPARISON:  Prior radiograph from 03/31/2011. FINDINGS: Stable cardiomegaly. Mediastinal silhouette within normal limits. Aortic atherosclerosis. Lungs normally inflated. No pulmonary edema or pleural effusion. Hazy and streaky left basilar opacity, which may reflect infiltrate or subsegmental atelectasis. No other focal airspace disease. No pneumothorax. No acute osseus abnormality. IMPRESSION: 1. Streaky/hazy left basilar opacity. While this finding may in part reflect subsegmental atelectasis, possible infiltrate could be considered in the correct clinical setting. 2. Stable cardiomegaly without pulmonary edema. 3. Aortic atherosclerosis. Electronically Signed   By: Jeannine Boga M.D.   On: 08/15/2016 17:09    Procedures Procedures (including critical care time)  Medications Ordered in ED Medications  Ampicillin-Sulbactam (UNASYN) 3 g in sodium chloride 0.9 % 100 mL IVPB (not administered)  colesevelam [facility]) tablet 625 mg (not administered)  ALPRAZolam (XANAX) tablet  0.25 mg (not administered)  losartan (COZAAR) tablet 100 mg (not administered)  doxycycline (VIBRAMYCIN) 100 mg in dextrose 5 % 250 mL IVPB (100 mg Intravenous New Bag/Given 08/15/16 2318)  acetaminophen (TYLENOL) tablet 650 mg (not administered)    Or  acetaminophen (TYLENOL) suppository 650 mg (not administered)  ondansetron (ZOFRAN) tablet 4 mg (not administered)    Or  ondansetron (ZOFRAN) injection 4 mg (not administered)  0.9 %  sodium chloride infusion ( Intravenous New Bag/Given 08/15/16 2318)  Warfarin - Pharmacist Dosing Inpatient (not administered)  acetaminophen (TYLENOL) tablet 1,000 mg (1,000 mg Oral Given 08/15/16 1628)  sodium chloride 0.9 % bolus 1,000 mL (0 mLs Intravenous Stopped 08/15/16 1829)  Ampicillin-Sulbactam (UNASYN) 3 g in sodium chloride 0.9 % 100 mL IVPB (0 g Intravenous Stopped 08/15/16 1738)  warfarin (COUMADIN) tablet 5 mg (5 mg Oral Given 08/15/16 2318)     Initial Impression / Assessment and Plan / ED Course  I have reviewed the triage vital signs and the nursing notes.  Pertinent labs & imaging results that were available during my care of the patient were reviewed by me and considered in my medical decision making (see chart for details).     Patient's fever appears to be coming from a urinary tract infection. Her lab work is unremarkable however she continues to have intermittent tachycardia. There are no obvious P waves given how regular days it is likely some sort of a flutter. When she has the symptom she feels diffusely weak and fatigued. During this is likely infection/febrile driven inside out think acute cardioversion would be beneficial. Given she is on warfarin, I discussed with pharmacy for the best treatment given her cephalosporin allergy. To help minimize interaction with warfarin, pharmacy suggesting Unasyn based on prior culture results. This was given in addition to fluids. She does seem to get in and out of this arrhythmia and her blood  pressure is stable.  I discussed with Dr. Laverle Patter, who accepts in admission and transfer to [facility] for further treatment and care.  Final Clinical Impressions(s) / ED Diagnoses   Final diagnoses:  Acute UTI    New Prescriptions Current Discharge Medication List       Sherwood Gambler, MD 08/16/16 [phone]

## 2016-08-16 ENCOUNTER — Ambulatory Visit: Payer: Medicare Other

## 2016-08-16 DIAGNOSIS — A419 Sepsis, unspecified organism: Secondary | ICD-10-CM | POA: Diagnosis not present

## 2016-08-16 DIAGNOSIS — B9620 Unspecified Escherichia coli [E. coli] as the cause of diseases classified elsewhere: Secondary | ICD-10-CM | POA: Diagnosis not present

## 2016-08-16 DIAGNOSIS — N390 Urinary tract infection, site not specified: Secondary | ICD-10-CM | POA: Diagnosis not present

## 2016-08-16 DIAGNOSIS — T148XXA Other injury of unspecified body region, initial encounter: Secondary | ICD-10-CM | POA: Diagnosis not present

## 2016-08-16 LAB — COMPREHENSIVE METABOLIC PANEL
ALT: 33 U/L (ref 14–54)
AST: 60 U/L — ABNORMAL HIGH (ref 15–41)
Albumin: 3.4 g/dL — ABNORMAL LOW (ref 3.5–5.0)
Alkaline Phosphatase: 59 U/L (ref 38–126)
Anion gap: 8 (ref 5–15)
BUN: 17 mg/dL (ref 6–20)
CO2: 22 mmol/L (ref 22–32)
Calcium: 8 mg/dL — ABNORMAL LOW (ref 8.9–10.3)
Chloride: 108 mmol/L (ref 101–111)
Creatinine, Ser: 0.89 mg/dL (ref 0.44–1.00)
GFR calc Af Amer: >60 mL/min (ref >60)
GFR calc non Af Amer: 60 mL/min — ABNORMAL LOW (ref >60)
Glucose, Bld: 121 mg/dL — ABNORMAL HIGH (ref 65–99)
Potassium: 3.4 mmol/L — ABNORMAL LOW (ref 3.5–5.1)
Sodium: 138 mmol/L (ref 135–145)
Total Bilirubin: 0.8 mg/dL (ref 0.3–1.2)
Total Protein: 6.8 g/dL (ref 6.5–8.1)

## 2016-08-16 LAB — CBC WITH DIFFERENTIAL/PLATELET
Basophils Absolute: 0.1 10*3/uL (ref 0.0–0.1)
Basophils Relative: 1 %
Eosinophils Absolute: 0 10*3/uL (ref 0.0–0.7)
Eosinophils Relative: 0 %
HCT: 39.8 % (ref 36.0–46.0)
Hemoglobin: 13 g/dL (ref 12.0–15.0)
Lymphocytes Relative: 16 %
Lymphs Abs: 0.8 10*3/uL (ref 0.7–4.0)
MCH: 29.5 pg (ref 26.0–34.0)
MCHC: 32.7 g/dL (ref 30.0–36.0)
MCV: 90.5 fL (ref 78.0–100.0)
Monocytes Absolute: 0.2 10*3/uL (ref 0.1–1.0)
Monocytes Relative: 4 %
Neutro Abs: 3.7 10*3/uL (ref 1.7–7.7)
Neutrophils Relative %: 79 %
Platelets: 262 10*3/uL (ref 150–400)
RBC: 4.4 MIL/uL (ref 3.87–5.11)
RDW: 15.1 % (ref 11.5–15.5)
WBC: 4.7 10*3/uL (ref 4.0–10.5)

## 2016-08-16 LAB — PROTIME-INR
INR: 2.24
Prothrombin Time: 25.1 seconds — ABNORMAL HIGH (ref 11.4–15.2)

## 2016-08-16 LAB — TSH: TSH: 0.876 u[IU]/mL (ref 0.350–4.500)

## 2016-08-16 MED ORDER — POTASSIUM CHLORIDE CRYS ER 20 MEQ PO TBCR
40.0000 meq | EXTENDED_RELEASE_TABLET | Freq: Once | ORAL | Status: AC
  Administered 2016-08-16: 40 meq via ORAL
  Filled 2016-08-16: qty 2

## 2016-08-16 MED ORDER — TRAMADOL HCL 50 MG PO TABS
50.0000 mg | ORAL_TABLET | Freq: Four times a day (QID) | ORAL | Status: DC | PRN
  Administered 2016-08-16 – 2016-08-17 (×2): 50 mg via ORAL
  Filled 2016-08-16 (×2): qty 1

## 2016-08-16 MED ORDER — SODIUM CHLORIDE 0.9 % IV BOLUS (SEPSIS)
250.0000 mL | Freq: Once | INTRAVENOUS | Status: AC
  Administered 2016-08-16: 250 mL via INTRAVENOUS

## 2016-08-16 MED ORDER — DOXYCYCLINE HYCLATE 100 MG PO TABS
100.0000 mg | ORAL_TABLET | Freq: Two times a day (BID) | ORAL | Status: DC
  Administered 2016-08-16 – 2016-08-17 (×3): 100 mg via ORAL
  Filled 2016-08-16 (×3): qty 1

## 2016-08-16 MED ORDER — WARFARIN SODIUM 2.5 MG PO TABS
2.5000 mg | ORAL_TABLET | Freq: Once | ORAL | Status: AC
  Administered 2016-08-16: 2.5 mg via ORAL
  Filled 2016-08-16: qty 1

## 2016-08-16 NOTE — Progress Notes (Signed)
PROGRESS NOTE    [name] Little  EVO:[id number] DOB: 1933/07/21 DOA: 08/15/2016 PCP: Mosie Lukes, MD     Brief Narrative:  [name] Little is a 83 y.o. female with history of factor V Leiden deficiency, hypertension has been experiencing occipital headache with dysuria for last 2 days. Patient also has been having palpitations with subjective feeling of fever chills. Patient had gone to her PCPs office and was referred to the ER. Denies any nausea vomiting abdominal pain chest pain shortness of breath or diarrhea. Patient states she did work in the yard and had tick bites 4 days ago. In the ER patient was found to be tachycardic with fever of 102F. UA was compatible with UTI. Patient was started on empiric antibiotics blood cultures were sent and admitted for further management.  Assessment & Plan:   Active Problems:   Essential hypertension   Paroxysmal supraventricular tachycardia (HCC)   FH: factor V Leiden mutation   Acute UTI   SIRS (systemic inflammatory response syndrome) (HCC)  Sepsis secondary to UTI -Fever 103.1 and tachycardia on admission. Sepsis pathology improving.  -Urine culture showing GNR. Identification pending  -Continue unasyn (allergy/side effect to cephalosporins)  -Blood culture pending   Tick exposure -RMSF and Lyme Ab pending  -Continue doxycycline   Factor V Leiden deficiency -Coumadin per pharmacy  HTN -Continue cozaar    DVT prophylaxis: coumadin Code Status: Full Family Communication: daughter at bedside Disposition Plan: pending improvement   Consultants:   none  Procedures:   none  Antimicrobials:  Anti-infectives    Start     Dose/Rate Route Frequency Ordered Stop   08/16/16 0200  Ampicillin-Sulbactam (UNASYN) 3 g in sodium chloride 0.9 % 100 mL IVPB     3 g 200 mL/hr over 30 Minutes Intravenous Every 8 hours 08/15/16 1701     08/16/16 0000  doxycycline (VIBRAMYCIN) 100 mg in dextrose 5 % 250 mL IVPB     100 mg 125 mL/hr  over 120 Minutes Intravenous Every 12 hours 08/15/16 2247     08/15/16 1700  Ampicillin-Sulbactam (UNASYN) 3 g in sodium chloride 0.9 % 100 mL IVPB     3 g 200 mL/hr over 30 Minutes Intravenous  Once 08/15/16 1657 08/15/16 1738        Subjective: Feeling much better this morning, eating breakfast. No further episodes of fever, chills, chest pain, SOB, abdominal pain. Denies dysuria, urinary frequency. Wondering when she can go home.   Objective: Vitals:   08/15/16 1900 08/15/16 2040 08/16/16 0311 08/16/16 0441  BP: (!) 113/52 (!) 147/54  133/60  Pulse: (!) 56 72  91  Resp: 17 18  20   Temp:  99.6 F (37.6 C) 99.8 F (37.7 C) 100.2 F (37.9 C)  TempSrc:  Oral Oral Oral  SpO2: 94% 97%  94%  Weight:  75.5 kg (166 lb 7.2 oz)    Height:  5\' 3"  (1.6 m)      Intake/Output Summary (Last 24 hours) at 08/16/16 1457 Last data filed at 08/16/16 0830  Gross per 24 hour  Intake          2550.42 ml  Output              575 ml  Net          1975.42 ml   Filed Weights   08/15/16 1617 08/15/16 2040  Weight: 79.4 kg (175 lb) 75.5 kg (166 lb 7.2 oz)    Examination:  General exam: Appears calm  and comfortable  Respiratory system: Clear to auscultation. Respiratory effort normal. Cardiovascular system: S1 & S2 heard, RRR. No JVD, murmurs, rubs, gallops or clicks. No pedal edema. Gastrointestinal system: Abdomen is nondistended, soft and nontender. No organomegaly or masses felt. Normal bowel sounds heard. Central nervous system: Alert and oriented. No focal neurological deficits. Extremities: Symmetric 5 x 5 power. Skin: No rashes, lesions or ulcers Psychiatry: Judgement and insight appear normal. Mood & affect appropriate.   Data Reviewed: I have personally reviewed following labs and imaging studies  CBC:  Recent Labs Lab 08/15/16 1616 08/16/16 0412  WBC 5.5 4.7  NEUTROABS 4.1 3.7  HGB 15.0 13.0  HCT 44.2 39.8  MCV 90.0 90.5  PLT 301 676   Basic Metabolic Panel:  Recent  Labs Lab 08/15/16 1616 08/16/16 0412  NA 136 138  K 3.7 3.4*  CL 102 108  CO2 24 22  GLUCOSE 123* 121*  BUN 17 17  CREATININE 0.93 0.89  CALCIUM 9.1 8.0*   GFR: Estimated Creatinine Clearance: 49 mL/min (by C-G formula based on SCr of 0.89 mg/dL). Liver Function Tests:  Recent Labs Lab 08/15/16 1616 08/16/16 0412  AST 34 60*  ALT 20 33  ALKPHOS 62 59  BILITOT 0.7 0.8  PROT 8.1 6.8  ALBUMIN 4.1 3.4*   No results for input(s): LIPASE, AMYLASE in the last 168 hours. No results for input(s): AMMONIA in the last 168 hours. Coagulation Profile:  Recent Labs Lab 08/15/16 1616 08/16/16 0412  INR 2.11 2.24   Cardiac Enzymes:  Recent Labs Lab 08/15/16 1655  TROPONINI <0.03   BNP (last 3 results) No results for input(s): PROBNP in the last 8760 hours. HbA1C: No results for input(s): HGBA1C in the last 72 hours. CBG: No results for input(s): GLUCAP in the last 168 hours. Lipid Profile: No results for input(s): CHOL, HDL, LDLCALC, TRIG, CHOLHDL, LDLDIRECT in the last 72 hours. Thyroid Function Tests:  Recent Labs  08/16/16 0412  TSH 0.876   Anemia Panel: No results for input(s): VITAMINB12, FOLATE, FERRITIN, TIBC, IRON, RETICCTPCT in the last 72 hours. Sepsis Labs:  Recent Labs Lab 08/15/16 1628 08/15/16 2303  LATICACIDVEN 0.96 1.2    Recent Results (from the past 240 hour(s))  Urine culture     Status: Abnormal (Preliminary result)   Collection Time: 08/15/16  4:19 PM  Result Value Ref Range Status   Specimen Description URINE, RANDOM  Final   Special Requests NONE  Final   Culture (A)  Final    >=100,000 COLONIES/mL GRAM NEGATIVE RODS CULTURE REINCUBATED FOR BETTER GROWTH Performed at [facility], [place] [address], [place],  [address]    Report Status PENDING  Incomplete       Radiology Studies: Dg Chest 2 View  Result Date: 08/15/2016 CLINICAL DATA:  Initial evaluation for suspected sepsis. EXAM: CHEST  2 VIEW COMPARISON:   Prior radiograph from 03/31/2011. FINDINGS: Stable cardiomegaly. Mediastinal silhouette within normal limits. Aortic atherosclerosis. Lungs normally inflated. No pulmonary edema or pleural effusion. Hazy and streaky left basilar opacity, which may reflect infiltrate or subsegmental atelectasis. No other focal airspace disease. No pneumothorax. No acute osseus abnormality. IMPRESSION: 1. Streaky/hazy left basilar opacity. While this finding may in part reflect subsegmental atelectasis, possible infiltrate could be considered in the correct clinical setting. 2. Stable cardiomegaly without pulmonary edema. 3. Aortic atherosclerosis. Electronically Signed   By: Jeannine Boga M.D.   On: 08/15/2016 17:09      Scheduled Meds: . colesevelam  625  mg Oral BID WC  . losartan  100 mg Oral Daily  . warfarin  2.5 mg Oral ONCE-1800  . Warfarin - Pharmacist Dosing Inpatient   Does not apply q1800   Continuous Infusions: . sodium chloride 125 mL/hr at 08/16/16 1200  . ampicillin-sulbactam (UNASYN) IV Stopped (08/16/16 0921)  . doxycycline (VIBRAMYCIN) IV 100 mg (08/16/16 1200)     LOS: 1 day    Time spent: 40 minutes   Dessa Phi, DO [facility] [email or web address] Password [facility] 08/16/2016, 2:57 PM

## 2016-08-16 NOTE — Progress Notes (Signed)
Pt HR sustaining 130-140s at this time. Dr. Hal Hope notified. Will continue to monitor pt and carry out new orders.

## 2016-08-16 NOTE — Progress Notes (Signed)
Dr. Hal Hope verbally ordered a 24mL NS bolus, administered and will continue to monitor pt.

## 2016-08-16 NOTE — Progress Notes (Signed)
[facility] Part for warfarin Indication: factor V Leiden deficiency   Allergies  Allergen Reactions  . Amlodipine Swelling    "my whole body swelled"  . Carvedilol Swelling    "my whole body swelled"  . Cefuroxime Axetil     REACTION: Ddoes not remember reaction  . Chlorthalidone     Patient reported extreme weakness.  . Statins     Weakness, pain Lipitor and Pravachol  . Sulfonamide Derivatives     REACTION: Rash   Patient Measurements: Height: 5\' 3"  (160 cm) Weight: 166 lb 7.2 oz (75.5 kg) IBW/kg (Calculated) : 52.4  Vital Signs: Temp: 100.2 F (37.9 C) (07/18 0441) Temp Source: Oral (07/18 0441) BP: 133/60 (07/18 0441) Pulse Rate: 91 (07/18 0441)  Labs:  Recent Labs  08/15/16 1616 08/15/16 1655 08/16/16 0412  HGB 15.0  --  13.0  HCT 44.2  --  39.8  PLT 301  --  262  LABPROT 24.0*  --  25.1*  INR 2.11  --  2.24  CREATININE 0.93  --  0.89  TROPONINI  --  <0.03  --    Estimated Creatinine Clearance: 49 mL/min (by C-G formula based on SCr of 0.89 mg/dL).  Medical History: Past Medical History:  Diagnosis Date  . Anxiety state, unspecified 09/22/2013  . Arthritis    lumbar stenosis, radiculopathy, OA- L hip  . BCC (basal cell carcinoma), scalp/neck 09/27/2015  . Blood clotting disorder (HCC)    V5 factor  . Blood dyscrasia    factor 5 deficiency  . Clostridium difficile infection   . Clotting disorder ([place])   . Diabetes mellitus type 2 in obese ([place]) 02/26/2014  . DVT (deep venous thrombosis) ([place])   . Factor V deficiency ([place])   . Hemorrhoids   . Hyperglycemia 02/26/2014  . Hyperlipemia   . Hypertension   . Infiltrating ductal carcinoma of breast ([place])    stage one right   . Low back pain 03/26/2014  . Macular degeneration   . Mixed hyperlipidemia 05/18/2015  . Nocturia 02/26/2014  . Paroxysmal supraventricular tachycardia ([place])   . Personal history of colonic polyps    hyperplastic  . Thoracic aortic aneurysm  ([place])   . TMJ pain dysfunction syndrome    Assessment: 83 year old female with diabetes mellitus, factor V leiden deficiency on warfarin who admitted with symptoms of dysuria, headaches and generalized weakness.  Pharmacy consulted to dose warfarin.  Home dose 5mg  daily, except 2.5mg  on Monday, last dose 7/16 @ 1900  Today, 08/16/2016  INR 2.24 this am, no Warfarin yesterday  Regular diet ordered  CBC decreased from admit levels (7/18)  DI: pt started on doxycycline (for tick exposure), may increase hypoprothrombinemic effects of warfarin  Goal of Therapy:  INR 2-3   Plan:   Warfarin 2.5mg  today at 1800  Daily Protime/INR  Minda Ditto PharmD Pager [phone] 08/16/2016, 11:45 AM

## 2016-08-16 NOTE — Care Management CC44 (Signed)
Condition Code 44 Documentation Completed  Patient Details  Name: ISIDORA LAHAM MRN: [id number] Date of Birth: 20-May-1933   Condition Code 44 given:    Patient signature on Condition Code 44 notice:    Documentation of 2 MD's agreement:   yes Code 44 added to claim:   yes    Delrae Sawyers, RN 08/16/2016, 5:25 PM

## 2016-08-17 DIAGNOSIS — N390 Urinary tract infection, site not specified: Secondary | ICD-10-CM | POA: Diagnosis not present

## 2016-08-17 DIAGNOSIS — B9620 Unspecified Escherichia coli [E. coli] as the cause of diseases classified elsewhere: Secondary | ICD-10-CM

## 2016-08-17 LAB — BASIC METABOLIC PANEL
Anion gap: 6 (ref 5–15)
BUN: 18 mg/dL (ref 6–20)
CO2: 23 mmol/L (ref 22–32)
Calcium: 8.1 mg/dL — ABNORMAL LOW (ref 8.9–10.3)
Chloride: 109 mmol/L (ref 101–111)
Creatinine, Ser: 0.8 mg/dL (ref 0.44–1.00)
GFR calc Af Amer: >60 mL/min (ref >60)
GFR calc non Af Amer: >60 mL/min (ref >60)
Glucose, Bld: 86 mg/dL (ref 65–99)
Potassium: 3.8 mmol/L (ref 3.5–5.1)
Sodium: 138 mmol/L (ref 135–145)

## 2016-08-17 LAB — CBC WITH DIFFERENTIAL/PLATELET
Basophils Absolute: 0.1 10*3/uL (ref 0.0–0.1)
Basophils Relative: 1 %
Eosinophils Absolute: 0 10*3/uL (ref 0.0–0.7)
Eosinophils Relative: 0 %
HCT: 40 % (ref 36.0–46.0)
Hemoglobin: 13 g/dL (ref 12.0–15.0)
Lymphocytes Relative: 44 %
Lymphs Abs: 2.3 10*3/uL (ref 0.7–4.0)
MCH: 29.7 pg (ref 26.0–34.0)
MCHC: 32.5 g/dL (ref 30.0–36.0)
MCV: 91.5 fL (ref 78.0–100.0)
Monocytes Absolute: 0.5 10*3/uL (ref 0.1–1.0)
Monocytes Relative: 9 %
Neutro Abs: 2.3 10*3/uL (ref 1.7–7.7)
Neutrophils Relative %: 46 %
Platelets: 201 10*3/uL (ref 150–400)
RBC: 4.37 MIL/uL (ref 3.87–5.11)
RDW: 15.7 % — ABNORMAL HIGH (ref 11.5–15.5)
WBC: 5.1 10*3/uL (ref 4.0–10.5)

## 2016-08-17 LAB — PROTIME-INR
INR: 1.8
Prothrombin Time: 21.1 seconds — ABNORMAL HIGH (ref 11.4–15.2)

## 2016-08-17 MED ORDER — DOXYCYCLINE HYCLATE 50 MG PO CAPS: 50.0000 mg | ORAL_CAPSULE | Freq: Two times a day (BID) | ORAL | 0 refills | Status: AC

## 2016-08-17 MED ORDER — CIPROFLOXACIN HCL 500 MG PO TABS: 500.0000 mg | ORAL_TABLET | Freq: Two times a day (BID) | ORAL | 0 refills | Status: AC

## 2016-08-17 MED ORDER — WARFARIN SODIUM 5 MG PO TABS
5.0000 mg | ORAL_TABLET | Freq: Once | ORAL | Status: AC
  Administered 2016-08-17: 5 mg via ORAL
  Filled 2016-08-17: qty 1

## 2016-08-17 NOTE — Discharge Summary (Signed)
Physician Discharge Summary  [name] Little QBH:[id number] DOB: 07-06-1933 DOA: 08/15/2016  PCP: Mosie Lukes, MD  Admit date: 08/15/2016 Discharge date: 08/17/2016  Admitted From: Home Disposition:  Home  Recommendations for Outpatient Follow-up:  1. Follow up with PCP in 1 week 2. Follow up with coumadin clinic per your usual schedule  3. Please follow up on the following pending results: final urine culture sensitivity, final blood culture results, RMSF and Lyme Ab pending   Discharge Condition: Stable CODE STATUS: Full  Diet recommendation: Heart healthy   Brief/Interim Summary: [name] Little a 83 y.o.femalewith history of factor V Leiden deficiency, hypertension has been experiencing occipital headache with dysuria for last 2 days. Patient also has been having palpitations with subjective feeling of fever chills. Patient had gone to her PCPs office and was referred to the ER. Denies any nausea vomiting abdominal pain chest pain shortness of breath or diarrhea. Patient states she did work in the yard and had tick bites 4 days ago. In the ER patient was found to be tachycardic with fever of 102F. UA was compatible with UTI. Patient was started on empiric antibiotics blood cultures were sent and admitted for further management.  Sepsis secondary to E Coli UTI, POA  -Fever 103.1 and tachycardia on admission. Sepsis pathology improving.  -Urine culture showing >100,000 E Coli, sensitivity is pending prior to discharge but she has been clinically improving on unasyn. Transition to cipro (allergy/side effect to cephalosporins)  -Blood culture negative to date, final result pending   Tick exposure -RMSF and Lyme Ab pending  -Continue doxycycline for 10 total days   Factor V Leiden deficiency -Coumadin per pharmacy  HTN -Continue cozaar   Occipital headache -No neurologic deficits or concerns, treated with tylenol and ultram in hospital. Continue to monitor     Discharge Instructions  Discharge Instructions    Call MD for:  difficulty breathing, headache or visual disturbances    Complete by:  As directed    Call MD for:  extreme fatigue    Complete by:  As directed    Call MD for:  hives    Complete by:  As directed    Call MD for:  persistant dizziness or light-headedness    Complete by:  As directed    Call MD for:  persistant nausea and vomiting    Complete by:  As directed    Call MD for:  severe uncontrolled pain    Complete by:  As directed    Call MD for:  temperature >100.4    Complete by:  As directed    Diet - low sodium heart healthy    Complete by:  As directed    Discharge instructions    Complete by:  As directed    You were cared for by a hospitalist during your hospital stay. If you have any questions about your discharge medications or the care you received while you were in the hospital after you are discharged, you can call the unit and asked to speak with the hospitalist on call if the hospitalist that took care of you is not available. Once you are discharged, your primary care physician will handle any further medical issues. Please note that NO REFILLS for any discharge medications will be authorized once you are discharged, as it is imperative that you return to your primary care physician (or establish a relationship with a primary care physician if you do not have one) for your aftercare needs so  that they can reassess your need for medications and monitor your lab values.   Increase activity slowly    Complete by:  As directed      Allergies as of 08/17/2016      Reactions   Amlodipine Swelling   "my whole body swelled"   Carvedilol Swelling   "my whole body swelled"   Cefuroxime Axetil    REACTION: Ddoes not remember reaction   Chlorthalidone    Patient reported extreme weakness.   Statins    Weakness, pain Lipitor and Pravachol   Sulfonamide Derivatives    REACTION: Rash      Medication List     TAKE these medications   ALPRAZolam 0.25 MG tablet Commonly known as:  XANAX TAKE ONE TABLET BY MOUTH TWICE DIALY AS NEEDED FOR ANXIETY   ciprofloxacin 500 MG tablet Commonly known as:  CIPRO Take 1 tablet (500 mg total) by mouth 2 (two) times daily.   colesevelam 625 MG tablet Commonly known as:  WELCHOL Take 1 tablet (625 mg total) by mouth 2 (two) times daily with a meal.   doxycycline 50 MG capsule Commonly known as:  VIBRAMYCIN Take 1 capsule (50 mg total) by mouth 2 (two) times daily.   furosemide 20 MG tablet Commonly known as:  LASIX Take 1 tablet (20 mg total) by mouth daily as needed for fluid or edema.   glucose blood test strip Commonly known as:  IGLUCOSE TEST STRIPS Test Blood Sugars every day as needed   glucose monitoring kit monitoring kit 1 each by Does not apply route as needed for other.   Lancets Misc Test Blood Sugars every day as needed   losartan 100 MG tablet Commonly known as:  COZAAR TAKE 1 TABLET (100 MG TOTAL) BY MOUTH DAILY.   MULTIVITAMIN ADULTS Tabs Take 1 tablet by mouth daily.   potassium chloride SA 20 MEQ tablet Commonly known as:  K-DUR,KLOR-CON Take 1 tablet (20 mEq total) by mouth daily as needed (lasix use).   VITAMIN B COMPLEX PO Take 1 tablet by mouth daily.   warfarin 5 MG tablet Commonly known as:  COUMADIN TAKE AS DIRECTED BY COUMADIN CLINIC. What changed:  See the new instructions.      Follow-up Information    Blyth, Stacey A, MD. Schedule an appointment as soon as possible for a visit in 1 week(s).   Specialty:  Family Medicine Why:  Please follow up on the following pending results: final urine culture sensitivity, final blood culture results, RMSF and Lyme Ab pending  Contact information: [address] [place] [address] [phone]          Allergies  Allergen Reactions  . Amlodipine Swelling    "my whole body swelled"  . Carvedilol Swelling    "my whole body swelled"  .  Cefuroxime Axetil     REACTION: Ddoes not remember reaction  . Chlorthalidone     Patient reported extreme weakness.  . Statins     Weakness, pain Lipitor and Pravachol  . Sulfonamide Derivatives     REACTION: Rash    Consultations:  None   Procedures/Studies: Dg Chest 2 View  Result Date: 08/15/2016 CLINICAL DATA:  Initial evaluation for suspected sepsis. EXAM: CHEST  2 VIEW COMPARISON:  Prior radiograph from 03/31/2011. FINDINGS: Stable cardiomegaly. Mediastinal silhouette within normal limits. Aortic atherosclerosis. Lungs normally inflated. No pulmonary edema or pleural effusion. Hazy and streaky left basilar opacity, which may reflect infiltrate or subsegmental atelectasis. No   other focal airspace disease. No pneumothorax. No acute osseus abnormality. IMPRESSION: 1. Streaky/hazy left basilar opacity. While this finding may in part reflect subsegmental atelectasis, possible infiltrate could be considered in the correct clinical setting. 2. Stable cardiomegaly without pulmonary edema. 3. Aortic atherosclerosis. Electronically Signed   By: Benjamin  McClintock M.D.   On: 08/15/2016 17:09       Discharge Exam: Vitals:   08/16/16 2114 08/17/16 0532  BP: (!) 122/59 (!) 143/67  Pulse: (!) 56 73  Resp: 18 18  Temp: 98.5 F (36.9 C) (!) 100.4 F (38 C)   Vitals:   08/16/16 0441 08/16/16 1500 08/16/16 2114 08/17/16 0532  BP: 133/60 (!) 148/64 (!) 122/59 (!) 143/67  Pulse: 91 88 (!) 56 73  Resp: 20 20 18 18  Temp: 100.2 F (37.9 C) 99.3 F (37.4 C) 98.5 F (36.9 C) (!) 100.4 F (38 C)  TempSrc: Oral Oral Oral Oral  SpO2: 94% 92% 95% 98%  Weight:      Height:        General: Pt is alert, awake, not in acute distress Cardiovascular: RRR, S1/S2 +, no rubs, no gallops Respiratory: CTA bilaterally, no wheezing, no rhonchi Abdominal: Soft, NT, ND, bowel sounds + Extremities: no edema, no cyanosis Neuro: no focal deficits, CN 2-12 grossly in tact, moves all extremities,  speech fluent     The results of significant diagnostics from this hospitalization (including imaging, microbiology, ancillary and laboratory) are listed below for reference.     Microbiology: Recent Results (from the past 240 hour(s))  Urine culture     Status: Abnormal (Preliminary result)   Collection Time: 08/15/16  4:19 PM  Result Value Ref Range Status   Specimen Description URINE, RANDOM  Final   Special Requests NONE  Final   Culture (A)  Final    >=100,000 COLONIES/mL ESCHERICHIA COLI SUSCEPTIBILITIES TO FOLLOW Performed at [facility], [address] [place], [place] [address]    Report Status PENDING  Incomplete  Culture, blood (Routine x 2)     Status: None (Preliminary result)   Collection Time: 08/15/16  4:25 PM  Result Value Ref Range Status   Specimen Description BLOOD LEFT ANTECUBITAL  Final   Special Requests   Final    BOTTLES DRAWN AEROBIC AND ANAEROBIC Blood Culture adequate volume   Culture   Final    NO GROWTH 2 DAYS Performed at [facility], [address] [place], [place] [address]    Report Status PENDING  Incomplete  Culture, blood (Routine x 2)     Status: None (Preliminary result)   Collection Time: 08/15/16  5:00 PM  Result Value Ref Range Status   Specimen Description BLOOD BLOOD LEFT WRIST  Final   Special Requests   Final    BOTTLES DRAWN AEROBIC AND ANAEROBIC Blood Culture adequate volume   Culture   Final    NO GROWTH 2 DAYS Performed at [facility], [address] [place],  [address]    Report Status PENDING  Incomplete     Labs: BNP (last 3 results) No results for input(s): BNP in the last 8760 hours. Basic Metabolic Panel:  Recent Labs Lab 08/15/16 1616 08/16/16 0412 08/17/16 0417  NA 136 138 138  K 3.7 3.4* 3.8  CL 102 108 109  CO2 24 22 23  GLUCOSE 123* 121* 86  BUN 17 17 18  CREATININE 0.93 0.89 0.80  CALCIUM 9.1 8.0* 8.1*   Liver Function   Tests:  Recent Labs Lab 08/15/16 1616  08/16/16 0412  AST 34 60*  ALT 20 33  ALKPHOS 62 59  BILITOT 0.7 0.8  PROT 8.1 6.8  ALBUMIN 4.1 3.4*   No results for input(s): LIPASE, AMYLASE in the last 168 hours. No results for input(s): AMMONIA in the last 168 hours. CBC:  Recent Labs Lab 08/15/16 1616 08/16/16 0412 08/17/16 0417  WBC 5.5 4.7 5.1  NEUTROABS 4.1 3.7 2.3  HGB 15.0 13.0 13.0  HCT 44.2 39.8 40.0  MCV 90.0 90.5 91.5  PLT 301 262 201   Cardiac Enzymes:  Recent Labs Lab 08/15/16 1655  TROPONINI <0.03   BNP: Invalid input(s): POCBNP CBG: No results for input(s): GLUCAP in the last 168 hours. D-Dimer No results for input(s): DDIMER in the last 72 hours. Hgb A1c No results for input(s): HGBA1C in the last 72 hours. Lipid Profile No results for input(s): CHOL, HDL, LDLCALC, TRIG, CHOLHDL, LDLDIRECT in the last 72 hours. Thyroid function studies  Recent Labs  08/16/16 0412  TSH 0.876   Anemia work up No results for input(s): VITAMINB12, FOLATE, FERRITIN, TIBC, IRON, RETICCTPCT in the last 72 hours. Urinalysis    Component Value Date/Time   COLORURINE YELLOW 08/15/2016 1619   APPEARANCEUR CLEAR 08/15/2016 1619   LABSPEC 1.021 08/15/2016 1619   PHURINE 6.0 08/15/2016 1619   GLUCOSEU NEGATIVE 08/15/2016 1619   GLUCOSEU NEGATIVE 09/27/2015 1437   HGBUR TRACE (A) 08/15/2016 1619   BILIRUBINUR NEGATIVE 08/15/2016 1619   KETONESUR 40 (A) 08/15/2016 1619   PROTEINUR NEGATIVE 08/15/2016 1619   UROBILINOGEN 0.2 09/27/2015 1437   NITRITE POSITIVE (A) 08/15/2016 1619   LEUKOCYTESUR SMALL (A) 08/15/2016 1619   Sepsis Labs Invalid input(s): PROCALCITONIN,  WBC,  LACTICIDVEN Microbiology Recent Results (from the past 240 hour(s))  Urine culture     Status: Abnormal (Preliminary result)   Collection Time: 08/15/16  4:19 PM  Result Value Ref Range Status   Specimen Description URINE, RANDOM  Final   Special Requests NONE  Final   Culture (A)  Final    >=100,000 COLONIES/mL ESCHERICHIA  COLI SUSCEPTIBILITIES TO FOLLOW Performed at [facility], [address], [place], [place] [address]    Report Status PENDING  Incomplete  Culture, blood (Routine x 2)     Status: None (Preliminary result)   Collection Time: 08/15/16  4:25 PM  Result Value Ref Range Status   Specimen Description BLOOD LEFT ANTECUBITAL  Final   Special Requests   Final    BOTTLES DRAWN AEROBIC AND ANAEROBIC Blood Culture adequate volume   Culture   Final    NO GROWTH 2 DAYS Performed at [facility], [place] [address], [place], [place] [address]    Report Status PENDING  Incomplete  Culture, blood (Routine x 2)     Status: None (Preliminary result)   Collection Time: 08/15/16  5:00 PM  Result Value Ref Range Status   Specimen Description BLOOD BLOOD LEFT WRIST  Final   Special Requests   Final    BOTTLES DRAWN AEROBIC AND ANAEROBIC Blood Culture adequate volume   Culture   Final    NO GROWTH 2 DAYS Performed at [facility], [place] [address] [place], [place] [address]    Report Status PENDING  Incomplete     Time coordinating discharge: 40 minutes  SIGNED:  Dessa Phi, DO [facility] Pager [phone]  If 7PM-7AM, please contact night-coverage [email or web address] Password [facility] 08/17/2016, 12:57 PM

## 2016-08-17 NOTE — Evaluation (Signed)
Occupational Therapy Evaluation and Discharge Patient Details Name: [name] Little MRN: [id number] DOB: 1933-03-05 Today's Date: 08/17/2016    History of Present Illness 83 y.o. female with history of factor V Leiden deficiency, hypertension has been experiencing occipital headache with dysuria for last 2 days. Patient also has been having palpitations with subjective feeling of fever chills.   Clinical Impression   Pt reports she was independent with ADL PTA. Currently pt overall min guard-supervision for ADL and functional mobility. Pt with mild balance deficits but no LOB during functional activities. Pt planning to d/c home with 24/7 supervision from family. No further acute OT needs identified; signing off at this time. Please re-consult if needs change. Thank you for this referral.    Follow Up Recommendations  No OT follow up;Supervision - Intermittent    Equipment Recommendations  None recommended by OT    Recommendations for Other Services PT consult     Precautions / Restrictions Precautions Precautions: Fall Restrictions Weight Bearing Restrictions: No      Mobility Bed Mobility Overal bed mobility: Modified Independent             General bed mobility comments: HOB elevated. No assist requied  Transfers Overall transfer level: Needs assistance Equipment used: None Transfers: Sit to/from Stand Sit to Stand: Min guard;Supervision         General transfer comment: Min guard initially from EOB, supervision from toilet    Balance Overall balance assessment: Needs assistance Sitting-balance support: Feet supported;No upper extremity supported Sitting balance-Leahy Scale: Normal     Standing balance support: No upper extremity supported;During functional activity Standing balance-Leahy Scale: Good                             ADL either performed or assessed with clinical judgement   ADL Overall ADL's : Needs  assistance/impaired Eating/Feeding: Set up;Sitting   Grooming: Supervision/safety;Standing;Wash/dry hands   Upper Body Bathing: Set up;Sitting   Lower Body Bathing: Supervison/ safety;Sit to/from stand   Upper Body Dressing : Set up;Sitting   Lower Body Dressing: Supervision/safety;Sit to/from stand   Toilet Transfer: Min guard;Ambulation;Regular Toilet   Toileting- Water quality scientist and Hygiene: Supervision/safety;Sit to/from stand   Tub/ Shower Transfer: Tub transfer;Supervision/safety;Ambulation;Grab bars Tub/Shower Transfer Details (indicate cue type and reason): Simulated tub transfer in bathroom Functional mobility during ADLs: Min guard       Vision Baseline Vision/History: Wears glasses Wears Glasses: At all times       Perception     Praxis      Pertinent Vitals/Pain Pain Assessment: No/denies pain     Hand Dominance Right   Extremity/Trunk Assessment Upper Extremity Assessment Upper Extremity Assessment: Overall WFL for tasks assessed   Lower Extremity Assessment Lower Extremity Assessment: Defer to PT evaluation       Communication Communication Communication: No difficulties   Cognition Arousal/Alertness: Awake/alert Behavior During Therapy: WFL for tasks assessed/performed Overall Cognitive Status: Within Functional Limits for tasks assessed                                     General Comments       Exercises     Shoulder Instructions      Home Living Family/patient expects to be discharged to:: Private residence Living Arrangements: Spouse/significant other Available Help at Discharge: Family;Available 24 hours/day Type of Home: House Home Access: Stairs to  enter Entrance Stairs-Number of Steps: 3   Home Layout: One level     Bathroom Shower/Tub: Tub/shower unit;Door   [employer] Toilet: Standard     Home Equipment: Grab bars - tub/shower          Prior Functioning/Environment Level of Independence:  Independent        Comments: does yard work        OT Problem List:        OT Treatment/Interventions:      OT Goals(Current goals can be found in the care plan section) Acute Rehab OT Goals Patient Stated Goal: home today OT Goal Formulation: All assessment and education complete, DC therapy  OT Frequency:     Barriers to D/C:            Co-evaluation              AM-PAC PT "6 Clicks" Daily Activity     Outcome Measure Help from another person eating meals?: None Help from another person taking care of personal grooming?: A Little Help from another person toileting, which includes using toliet, bedpan, or urinal?: A Little Help from another person bathing (including washing, rinsing, drying)?: A Little Help from another person to put on and taking off regular upper body clothing?: None Help from another person to put on and taking off regular lower body clothing?: A Little 6 Click Score: 20   End of Session    Activity Tolerance: Patient tolerated treatment well Patient left: in bed;with call bell/phone within reach;with family/visitor present  OT Visit Diagnosis: Unsteadiness on feet (R26.81)                Time: 7672-0947 OT Time Calculation (min): 13 min Charges:  OT General Charges $OT Visit: 1 Procedure OT Evaluation $OT Eval Moderate Complexity: 1 Procedure G-Codes: OT G-codes **NOT FOR INPATIENT CLASS** Functional Assessment Tool Used: Clinical judgement Functional Limitation: Self care Self Care Current Status (S9628): At least 1 percent but less than 20 percent impaired, limited or restricted Self Care Goal Status (Z6629): At least 1 percent but less than 20 percent impaired, limited or restricted Self Care Discharge Status [phone]): At least 1 percent but less than 20 percent impaired, limited or restricted   [name] Little, M.S., OTR/L Pager: [place] 08/17/2016, 10:06 AM

## 2016-08-17 NOTE — Care Management Obs Status (Signed)
[name] NOTIFICATION   Patient Details  Name: [name] Little MRN: [id number] Date of Birth: April 16, 1933   Medicare Observation Status Notification Given:  Yes    Purcell Mouton, RN 08/17/2016, 9:41 AM

## 2016-08-17 NOTE — Progress Notes (Signed)
Pt discharged to home instruction reviewed with pt and ackowledge understanding. SRP,RN

## 2016-08-17 NOTE — Progress Notes (Signed)
[place] for warfarin Indication: factor V Leiden deficiency   Allergies  Allergen Reactions  . Amlodipine Swelling    "my whole body swelled"  . Carvedilol Swelling    "my whole body swelled"  . Cefuroxime Axetil     REACTION: Ddoes not remember reaction  . Chlorthalidone     Patient reported extreme weakness.  . Statins     Weakness, pain Lipitor and Pravachol  . Sulfonamide Derivatives     REACTION: Rash   Patient Measurements: Height: 5\' 3"  (160 cm) Weight: 166 lb 7.2 oz (75.5 kg) IBW/kg (Calculated) : 52.4  Vital Signs: Temp: 100.4 F (38 C) (07/19 0532) Temp Source: Oral (07/19 0532) BP: 143/67 (07/19 0532) Pulse Rate: 73 (07/19 0532)  Labs:  Recent Labs  08/15/16 1616 08/15/16 1655 08/16/16 0412 08/17/16 0417  HGB 15.0  --  13.0 13.0  HCT 44.2  --  39.8 40.0  PLT 301  --  262 201  LABPROT 24.0*  --  25.1* 21.1*  INR 2.11  --  2.24 1.80  CREATININE 0.93  --  0.89 0.80  TROPONINI  --  <0.03  --   --    Estimated Creatinine Clearance: 54.5 mL/min (by C-G formula based on SCr of 0.8 mg/dL).  Medical History: Past Medical History:  Diagnosis Date  . Anxiety state, unspecified 09/22/2013  . Arthritis    lumbar stenosis, radiculopathy, OA- L hip  . BCC (basal cell carcinoma), scalp/neck 09/27/2015  . Blood clotting disorder (HCC)    V5 factor  . Blood dyscrasia    factor 5 deficiency  . Clostridium difficile infection   . Clotting disorder ([place])   . Diabetes mellitus type 2 in obese ([place]) 02/26/2014  . DVT (deep venous thrombosis) ([place])   . Factor V deficiency ([place])   . Hemorrhoids   . Hyperglycemia 02/26/2014  . Hyperlipemia   . Hypertension   . Infiltrating ductal carcinoma of breast ([place])    stage one right   . Low back pain 03/26/2014  . Macular degeneration   . Mixed hyperlipidemia 05/18/2015  . Nocturia 02/26/2014  . Paroxysmal supraventricular tachycardia ([place])   . Personal history of colonic polyps     hyperplastic  . Thoracic aortic aneurysm ([place])   . TMJ pain dysfunction syndrome    Assessment: 83 year old female with diabetes mellitus, factor V leiden deficiency on warfarin who admitted with symptoms of dysuria, headaches and generalized weakness.  Pharmacy consulted to dose warfarin.  Home dose 5mg  daily, except 2.5mg  on Monday, last dose 7/16 @ 1900  Today, 08/17/2016  INR 1.8 this am, below desired therapeutic range  Regular diet ordered  CBC decreased from admit levels (7/18)  DI: pt started on doxycycline (for tick exposure), may increase hypoprothrombinemic effects of warfarin  Goal of Therapy:  INR 2-3   Plan:   Warfarin 5mg  today at 1000  Daily Protime/INR  Minda Ditto PharmD Pager [phone] 08/17/2016, 8:32 AM

## 2016-08-18 ENCOUNTER — Telehealth: Payer: Self-pay

## 2016-08-18 ENCOUNTER — Telehealth: Payer: Self-pay | Admitting: Family Medicine

## 2016-08-18 LAB — URINE CULTURE: Culture: >=100000 — AB

## 2016-08-18 LAB — B. BURGDORFI ANTIBODIES: B burgdorferi Ab IgG+IgM: <0.91 {ISR} (ref 0.00–0.90)

## 2016-08-18 NOTE — Telephone Encounter (Signed)
Attempted to reach pt and received message that pt's voice mailbox has not been set up yet.

## 2016-08-18 NOTE — Telephone Encounter (Signed)
I am not able to see pt for hospital f/u due to my schedule.  I often cannot see my own hospital f/u's due to my schedule and rely heavily on [facility] for help w/ this.  It is appropriate for pt to see provider at [facility] and/or wait for PCP (pt has 7-14 days for TCM)

## 2016-08-18 NOTE — Telephone Encounter (Signed)
Reviewed labs lyme testing negative. Please ask her to d/c doxy. She should continue cipro for UTI.

## 2016-08-18 NOTE — Telephone Encounter (Signed)
Pt has hospital follow up with Dr. Etter Sjogren chase on 7/24 and is on cipro. E coli uti sensitive to cipro per chart review.

## 2016-08-18 NOTE — Telephone Encounter (Signed)
Received call from pt granddaughter Martinique Tramel, stating that she wanted to schedule a Hosp follow up with Dr. Birdie Riddle, caller was told that pt pcp was on vacation and asked about Dr. Birdie Riddle not being offered an appt with another provider and was transfer to the [place] office. When I asked if pt was wanting to transfer to Dr. Birdie Riddle granddaughter stated No I then explained that it would be best to f/u with a provider at the same location as pcp and caller stated that she wanted pt to see a doctor and no a PA. Please advise if this should be scheduled at [facility] or with you. I know you schedule is full with reschedules. I advised caller that I would call her back

## 2016-08-18 NOTE — Telephone Encounter (Signed)
PCP: Mosie Lukes, MD  Admit date: 08/15/2016 Discharge date: 08/17/2016  Admitted From: Home Disposition:  Home  Recommendations for Outpatient Follow-up:  1. Follow up with PCP in 1 week 2. Follow up with coumadin clinic per your usual schedule  3. Please follow up on the following pending results: final urine culture sensitivity, final blood culture results, RMSF and Lyme Ab pending   Results are back for urine culture and sensitivity.  Culture showed sensitivity to Cipro.  Pt is taking Cipro as prescribed.   Results for Blood culture and RMSF/Lyme Ab still pending.  Pt is on doxcycline.    Discharge Condition: Stable CODE STATUS: Full  Diet recommendation: Heart healthy   Caregiver: [place] (granddaughter) and patient provided information below:  Transition Care Management Follow-up Telephone Call  How have you been since you were released from the hospital? Doesn't feel great today.  She feels very weak.  She continues to experience a headache intermittently.  She has been taking tramadol and it doesn't seem to be helping very much.  Rated headache 6/10 at the time of call.  No vision changes.  No lightheadedness, dizziness, extreme weakness on one side.   She was advised to take tramadol and contact office if it doesn't relieve pain.  She continues to have a low grade fever 100, 100.5.   She mentioned voiding a lot and having increased stools.  She was encouraged to stay hydrated, which she has been by drinking plenty of water.  She was also encouraged to eat fiber rich foods and to add a probiotic to regimen.   Her last concern was sometimes it's hard to swallow food. Feels like it gets stuck in between her breast.  Will discuss with Dr. Carollee Herter for recommendations.  Otherwise, she denied chest pain, shortness of breath, n/v.        Blood sugars normal, 139 after lunch.     Do you understand why you were in the hospital? yes   Do you understand the discharge  instructions? yes  Items Reviewed:  Medications reviewed: yes  Allergies reviewed: yes  Dietary changes reviewed: yes  Referrals reviewed: no   Functional Questionnaire:   Activities of Daily Living (ADLs):   She states they are independent in the following: Pt is increasing activity slowly.  She requires some assistance from her granddaughter due to weakness.   States they require assistance with the following: see above.     Any transportation issues/concerns?: no   Any patient concerns? yes, granddaughter mentioned that she seems slightly confused.  She was advised that this maybe due to her UTI.  She stated understanding.  Granddaughter also wanted INR check to be added to hospital follow up visit.  Note placed in appt note.     Confirmed importance and date/time of follow-up visits scheduled: yes   Confirmed with patient if condition begins to worsen call PCP or go to the ER: yes

## 2016-08-18 NOTE — Telephone Encounter (Signed)
Pt scheduled to see Dr. Carollee Herter on 08/22/16 @ 11:30 am for hospital f/u.

## 2016-08-18 NOTE — Telephone Encounter (Signed)
Spoke to Dr. Etter Sjogren about TCM note.  New order was given for Zantac OTC for trouble swallowing until appt on Tuesday.  Otherwise, no new orders given.

## 2016-08-20 LAB — CULTURE, BLOOD (ROUTINE X 2)
Culture: NO GROWTH
Culture: NO GROWTH
Special Requests: ADEQUATE
Special Requests: ADEQUATE

## 2016-08-20 LAB — ROCKY MTN SPOTTED FVR ABS PNL(IGG+IGM)
RMSF IgG: NEGATIVE
RMSF IgM: 0.58 index (ref 0.00–0.89)

## 2016-08-22 ENCOUNTER — Ambulatory Visit (INDEPENDENT_AMBULATORY_CARE_PROVIDER_SITE_OTHER): Payer: Medicare Other | Admitting: Family Medicine

## 2016-08-22 ENCOUNTER — Encounter: Payer: Self-pay | Admitting: Family Medicine

## 2016-08-22 ENCOUNTER — Telehealth: Payer: Self-pay | Admitting: Family Medicine

## 2016-08-22 VITALS — BP 152/68 | HR 67 | Temp 98.1°F | Resp 16 | Ht 63.0 in | Wt 167.2 lb

## 2016-08-22 DIAGNOSIS — Z86718 Personal history of other venous thrombosis and embolism: Secondary | ICD-10-CM

## 2016-08-22 DIAGNOSIS — I471 Supraventricular tachycardia, unspecified: Secondary | ICD-10-CM

## 2016-08-22 DIAGNOSIS — Z7901 Long term (current) use of anticoagulants: Secondary | ICD-10-CM

## 2016-08-22 DIAGNOSIS — I10 Essential (primary) hypertension: Secondary | ICD-10-CM

## 2016-08-22 DIAGNOSIS — R600 Localized edema: Secondary | ICD-10-CM | POA: Diagnosis not present

## 2016-08-22 DIAGNOSIS — N390 Urinary tract infection, site not specified: Secondary | ICD-10-CM | POA: Diagnosis not present

## 2016-08-22 DIAGNOSIS — Z87440 Personal history of urinary (tract) infections: Secondary | ICD-10-CM | POA: Diagnosis not present

## 2016-08-22 LAB — POC URINALSYSI DIPSTICK (AUTOMATED)
Bilirubin, UA: NEGATIVE
Blood, UA: NEGATIVE
Glucose, UA: NEGATIVE
Ketones, UA: NEGATIVE
Leukocytes, UA: NEGATIVE
Nitrite, UA: NEGATIVE
Protein, UA: NEGATIVE
Spec Grav, UA: 1.015 (ref 1.010–1.025)
Urobilinogen, UA: 0.2 E.U./dL
pH, UA: 6 (ref 5.0–8.0)

## 2016-08-22 LAB — POCT INR: INR: 2.5

## 2016-08-22 MED ORDER — FUROSEMIDE 20 MG PO TABS: 20.0000 mg | ORAL_TABLET | Freq: Every day | ORAL | 1 refills | Status: DC | PRN

## 2016-08-22 NOTE — Progress Notes (Signed)
Patient ID: [name] Little, female   DOB: 10/30/1933, 83 y.o.   MRN: [id number]     Subjective:  I acted as a Education administrator for Dr. Carollee Herter.  Guerry Bruin, [place]   Patient ID: [name] Little, female    DOB: 11/01/1933, 83 y.o.   MRN: [id number]  Chief Complaint  Patient presents with  . Hospitalization Follow-up    HPI  Patient is in today for hospital follow up for UTI.  She has had some swelling in her legs since she got up at 5am.  Pt was in office with her husband and she c/o headaches and palpitations-- she was tachycardic and had a fever.  She was sent to ER and found to have a UTI / sepsis.  She was admitted for IV abx.  There was also a question of tick exposure.   Pt did well with abx.  Was switched to cipro and doxy and sent home on 7/19.  Lyme and rmsf titres were neg.  The urine cultre came back sensitive to cipro.  Pt is feeling better but is tired and c/o swelling in her legs.  She did take her lasix yesterday but states she normally takes it prn and has not needed it until yesterday.  No sob.  No chest pain.     Patient Care Team: Mosie Lukes, MD as PCP - General (Family Medicine) Zadie Rhine Clent Demark, MD as Consulting Physician (Ophthalmology)   Past Medical History:  Diagnosis Date  . Anxiety state, unspecified 09/22/2013  . Arthritis    lumbar stenosis, radiculopathy, OA- L hip  . BCC (basal cell carcinoma), scalp/neck 09/27/2015  . Blood clotting disorder (HCC)    V5 factor  . Blood dyscrasia    factor 5 deficiency  . Clostridium difficile infection   . Clotting disorder ([place])   . Diabetes mellitus type 2 in obese ([place]) 02/26/2014  . DVT (deep venous thrombosis) ([place])   . Factor V deficiency ([place])   . Hemorrhoids   . Hyperglycemia 02/26/2014  . Hyperlipemia   . Hypertension   . Infiltrating ductal carcinoma of breast ([place])    stage one right   . Low back pain 03/26/2014  . Macular degeneration   . Mixed hyperlipidemia 05/18/2015  . Nocturia 02/26/2014  . Paroxysmal  supraventricular tachycardia ([place])   . Personal history of colonic polyps    hyperplastic  . Thoracic aortic aneurysm ([place])   . TMJ pain dysfunction syndrome     Past Surgical History:  Procedure Laterality Date  . ABDOMINAL HYSTERECTOMY     1960- ? vaginal hysterectomy, not laparoscopic   . BREAST LUMPECTOMY  2010   right Dr. [name]  . COLONOSCOPY    . EYE SURGERY     bilateral cataracts- /w IOL   . JOINT REPLACEMENT     R hip replacement   . LAPAROSCOPIC HYSTERECTOMY    . LUMBAR LAMINECTOMY/DECOMPRESSION MICRODISCECTOMY  04/11/2011   Procedure: LUMBAR LAMINECTOMY/DECOMPRESSION MICRODISCECTOMY;  Surgeon: [name], MD;  Location: [facility];  Service: Neurosurgery;  Laterality: N/A;  Lumbar Five-Sacral One Microdiscectomy  . right calf surgery     for cancer  . SPLENECTOMY    . TOTAL HIP ARTHROPLASTY  1999   right  . UPPER GASTROINTESTINAL ENDOSCOPY      Family History  Problem Relation Age of Onset  . Heart disease Mother   . Heart disease Father   . Alzheimer's disease Sister   . Heart disease Unknown  uncles  . Clotting disorder Maternal Uncle   . Breast cancer Paternal Aunt   . Cancer Paternal Aunt        breast  . Colon cancer Neg Hx   . Anesthesia problems Neg Hx   . Hypotension Neg Hx   . Malignant hyperthermia Neg Hx   . Pseudochol deficiency Neg Hx     Social History   Social History  . Marital status: Married    Spouse name: N/A  . Number of children: 4  . Years of education: N/A   Occupational History  . Retired    Social History Main Topics  . Smoking status: Never Smoker  . Smokeless tobacco: Never Used  . Alcohol use No  . Drug use: No  . Sexual activity: No   Other Topics Concern  . Not on file   Social History Narrative   Married 1955   2 sons - '59, '61, 2 daughters '55, '57; 8 grandchildren; 1 great grand   I-ADLs          Outpatient Medications Prior to Visit  Medication Sig Dispense Refill  . ALPRAZolam  (XANAX) 0.25 MG tablet TAKE ONE TABLET BY MOUTH TWICE DIALY AS NEEDED FOR ANXIETY 20 tablet 1  . B Complex Vitamins (VITAMIN B COMPLEX PO) Take 1 tablet by mouth daily.     . ciprofloxacin (CIPRO) 500 MG tablet Take 1 tablet (500 mg total) by mouth 2 (two) times daily. 14 tablet 0  . colesevelam (WELCHOL) 625 MG tablet Take 1 tablet (625 mg total) by mouth 2 (two) times daily with a meal. 60 tablet 0  . doxycycline (VIBRAMYCIN) 50 MG capsule Take 1 capsule (50 mg total) by mouth 2 (two) times daily. 14 capsule 0  . glucose blood (IGLUCOSE TEST STRIPS) test strip Test Blood Sugars every day as needed 100 each 5  . glucose monitoring kit (FREESTYLE) monitoring kit 1 each by Does not apply route as needed for other. 1 each 0  . Lancets MISC Test Blood Sugars every day as needed 100 each 5  . losartan (COZAAR) 100 MG tablet TAKE 1 TABLET (100 MG TOTAL) BY MOUTH DAILY. 90 tablet 1  . Multiple Vitamins-Minerals (MULTIVITAMIN ADULTS) TABS Take 1 tablet by mouth daily.    . potassium chloride SA (K-DUR,KLOR-CON) 20 MEQ tablet Take 1 tablet (20 mEq total) by mouth daily as needed (lasix use). 30 tablet 0  . warfarin (COUMADIN) 5 MG tablet TAKE AS DIRECTED BY COUMADIN CLINIC. (Patient taking differently: Take 1 tablet (5 mg) by mouth daily except on Mon Take 0.5 tablet (2.5 mg)) 90 tablet 3  . furosemide (LASIX) 20 MG tablet Take 1 tablet (20 mg total) by mouth daily as needed for fluid or edema. 30 tablet 1   No facility-administered medications prior to visit.     Allergies  Allergen Reactions  . Amlodipine Swelling    "my whole body swelled"  . Carvedilol Swelling    "my whole body swelled"  . Cefuroxime Axetil     REACTION: Ddoes not remember reaction  . Chlorthalidone     Patient reported extreme weakness.  . Statins     Weakness, pain Lipitor and Pravachol  . Sulfonamide Derivatives     REACTION: Rash    Review of Systems  Constitutional: Positive for malaise/fatigue. Negative for  chills, fever and weight loss.  HENT: Negative for congestion.   Eyes: Negative for blurred vision.  Respiratory: Negative for cough and shortness of breath.  Cardiovascular: Positive for leg swelling. Negative for chest pain, palpitations, orthopnea, claudication and PND.  Gastrointestinal: Negative for vomiting.  Musculoskeletal: Negative for back pain.  Skin: Negative for rash.  Neurological: Negative for loss of consciousness and headaches.       Objective:    Physical Exam  Constitutional: She is oriented to person, place, and time. She appears well-developed and well-nourished.  HENT:  Head: Normocephalic and atraumatic.  Eyes: Conjunctivae and EOM are normal.  Neck: Normal range of motion. Neck supple. No JVD present. Carotid bruit is not present. No thyromegaly present.  Cardiovascular: Normal rate, regular rhythm and normal heart sounds.   No murmur heard. Pulmonary/Chest: Effort normal and breath sounds normal. No respiratory distress. She has no wheezes. She has no rales. She exhibits no tenderness.  Abdominal: Soft. She exhibits no distension. There is no tenderness. There is no rebound and no guarding.  Musculoskeletal: She exhibits edema. She exhibits no tenderness or deformity.  Neurological: She is alert and oriented to person, place, and time.  Psychiatric: She has a normal mood and affect.  Nursing note and vitals reviewed.   BP (!) 152/68   Pulse 67   Temp 98.1 F (36.7 C) (Oral)   Resp 16   Ht '5\' 3"'  (1.6 m)   Wt 167 lb 3.2 oz (75.8 kg)   SpO2 95%   BMI 29.62 kg/m  Wt Readings from Last 3 Encounters:  08/22/16 167 lb 3.2 oz (75.8 kg)  08/15/16 166 lb 7.2 oz (75.5 kg)  07/25/16 175 lb 3.2 oz (79.5 kg)   BP Readings from Last 3 Encounters:  08/22/16 (!) 152/68  08/17/16 (!) 143/67  08/10/16 128/75     Immunization History  Administered Date(s) Administered  . Influenza Split 11/14/2010  . Influenza Whole 11/08/2005, 10/28/2007, 11/23/2008,  11/26/2009  . Influenza, High Dose Seasonal PF 10/19/2015  . Influenza,inj,Quad PF,36+ Mos 11/26/2012, 09/22/2013, 10/20/2014  . Pneumococcal Conjugate-13 04/01/2013  . Pneumococcal Polysaccharide-23 10/30/2001, 07/27/2009  . Td 11/27/2001  . Tetanus 04/01/2013    Health Maintenance  Topic Date Due  . OPHTHALMOLOGY EXAM  08/10/2015  . INFLUENZA VACCINE  08/30/2016  . HEMOGLOBIN A1C  01/24/2017  . FOOT EXAM  04/17/2017  . TETANUS/TDAP  04/02/2023  . DEXA SCAN  Completed  . PNA vac Low Risk Adult  Completed    Lab Results  Component Value Date   WBC 5.1 08/17/2016   HGB 13.0 08/17/2016   HCT 40.0 08/17/2016   PLT 201 08/17/2016   GLUCOSE 86 08/17/2016   CHOL 230 (H) 07/25/2016   TRIG 235.0 (H) 07/25/2016   HDL 33.10 (L) 07/25/2016   LDLDIRECT 143.0 07/25/2016   LDLCALC 154 (H) 04/17/2016   ALT 33 08/16/2016   AST 60 (H) 08/16/2016   NA 138 08/17/2016   K 3.8 08/17/2016   CL 109 08/17/2016   CREATININE 0.80 08/17/2016   BUN 18 08/17/2016   CO2 23 08/17/2016   TSH 0.876 08/16/2016   INR 2.5 08/22/2016   HGBA1C 6.6 (H) 07/25/2016   MICROALBUR 1.5 10/20/2014    Lab Results  Component Value Date   TSH 0.876 08/16/2016   Lab Results  Component Value Date   WBC 5.1 08/17/2016   HGB 13.0 08/17/2016   HCT 40.0 08/17/2016   MCV 91.5 08/17/2016   PLT 201 08/17/2016   Lab Results  Component Value Date   NA 138 08/17/2016   K 3.8 08/17/2016   CHLORIDE 109 08/12/2013   CO2 23 08/17/2016  GLUCOSE 86 08/17/2016   BUN 18 08/17/2016   CREATININE 0.80 08/17/2016   BILITOT 0.8 08/16/2016   ALKPHOS 59 08/16/2016   AST 60 (H) 08/16/2016   ALT 33 08/16/2016   PROT 6.8 08/16/2016   ALBUMIN 3.4 (L) 08/16/2016   CALCIUM 8.1 (L) 08/17/2016   ANIONGAP 6 08/17/2016   GFR 66.48 07/25/2016   Lab Results  Component Value Date   CHOL 230 (H) 07/25/2016   Lab Results  Component Value Date   HDL 33.10 (L) 07/25/2016   Lab Results  Component Value Date   LDLCALC 154  (H) 04/17/2016   Lab Results  Component Value Date   TRIG 235.0 (H) 07/25/2016   Lab Results  Component Value Date   CHOLHDL 7 07/25/2016   Lab Results  Component Value Date   HGBA1C 6.6 (H) 07/25/2016         Assessment & Plan:   Problem List Items Addressed This Visit      Unprioritized   Anticoagulant long-term use - Primary   Relevant Orders   POCT INR (Completed)   DVT, HX OF    On coumadin inr 2.8 con't current dose  Recheck 1 month      Essential hypertension    con't losartan Slightly high today Pt will start lasix daily and f/u 2 weeks with pcp or sooner prn      Relevant Medications   furosemide (LASIX) 20 MG tablet   Other Relevant Orders   ECHOCARDIOGRAM COMPLETE   History of UTI   Relevant Orders   POCT Urinalysis Dipstick (Automated) (Completed)   Lower extremity edema    Take lasix every day Take K as well Elevated legs  Drink plenty of fluids F/u pcp in 1-2 weeks      Relevant Orders   ECHOCARDIOGRAM COMPLETE   Paroxysmal supraventricular tachycardia (HCC)    resoved with no other symptoms       Relevant Medications   furosemide (LASIX) 20 MG tablet   UTI (urinary tract infection)    Finish cipro  ua today normal          I am having Ms. Schleicher maintain her B Complex Vitamins (VITAMIN B COMPLEX PO), potassium chloride SA, losartan, warfarin, ALPRAZolam, glucose monitoring kit, Lancets, glucose blood, colesevelam, MULTIVITAMIN ADULTS, ciprofloxacin, doxycycline, ONE TOUCH ULTRA 2, and furosemide.  Meds ordered this encounter  Medications  . Blood Glucose Monitoring Suppl (ONE TOUCH ULTRA 2) w/Device KIT    Sig: See admin instructions.    Refill:  0  . furosemide (LASIX) 20 MG tablet    Sig: Take 1 tablet (20 mg total) by mouth daily as needed for fluid or edema.    Dispense:  30 tablet    Refill:  1    CMA served as scribe during this visit. History, Physical and Plan performed by medical provider. Documentation and  orders reviewed and attested to.  Ann Held, DO

## 2016-08-22 NOTE — Assessment & Plan Note (Signed)
Take lasix every day Take K as well Elevated legs  Drink plenty of fluids F/u pcp in 1-2 weeks

## 2016-08-22 NOTE — Telephone Encounter (Signed)
How about 8/3 at 7:30 I was supposed to see [name] Little them but I already saw him so that can get cancelled

## 2016-08-22 NOTE — Telephone Encounter (Signed)
Pt was advised to come back and F/U with PCP in 2 weeks. Not showing an available apt with PCP in 2 weeks. Pt says that she can come in early any morning that would work for provider if she's able?   Please advise for scheduling

## 2016-08-22 NOTE — Assessment & Plan Note (Signed)
On coumadin inr 2.8 con't current dose  Recheck 1 month

## 2016-08-22 NOTE — Patient Instructions (Signed)
Edema Edema is when you have too much fluid in your body or under your skin. Edema may make your legs, feet, and ankles swell up. Swelling is also common in looser tissues, like around your eyes. This is a common condition. It gets more common as you get older. There are many possible causes of edema. Eating too much salt (sodium) and being on your feet or sitting for a long time can cause edema in your legs, feet, and ankles. Hot weather may make edema worse. Edema is usually painless. Your skin may look swollen or shiny. Follow these instructions at home:  Keep the swollen body part raised (elevated) above the level of your heart when you are sitting or lying down.  Do not sit still or stand for a long time.  Do not wear tight clothes. Do not wear garters on your upper legs.  Exercise your legs. This can help the swelling go down.  Wear elastic bandages or support stockings as told by your doctor.  Eat a low-salt (low-sodium) diet to reduce fluid as told by your doctor.  Depending on the cause of your swelling, you may need to limit how much fluid you drink (fluid restriction).  Take over-the-counter and prescription medicines only as told by your doctor. Contact a doctor if:  Treatment is not working.  You have heart, liver, or kidney disease and have symptoms of edema.  You have sudden and unexplained weight gain. Get help right away if:  You have shortness of breath or chest pain.  You cannot breathe when you lie down.  You have pain, redness, or warmth in the swollen areas.  You have heart, liver, or kidney disease and get edema all of a sudden.  You have a fever and your symptoms get worse all of a sudden. Summary  Edema is when you have too much fluid in your body or under your skin.  Edema may make your legs, feet, and ankles swell up. Swelling is also common in looser tissues, like around your eyes.  Raise (elevate) the swollen body part above the level of your  heart when you are sitting or lying down.  Follow your doctor's instructions about diet and how much fluid you can drink (fluid restriction). This information is not intended to replace advice given to you by your health care provider. Make sure you discuss any questions you have with your health care provider. Document Released: 07/05/2007 Document Revised: 02/04/2016 Document Reviewed: 02/04/2016 Elsevier Interactive Patient Education  2017 [employer]  

## 2016-08-22 NOTE — Assessment & Plan Note (Signed)
resoved with no other symptoms

## 2016-08-22 NOTE — Assessment & Plan Note (Signed)
Finish cipro  ua today normal

## 2016-08-22 NOTE — Assessment & Plan Note (Signed)
con't losartan Slightly high today Pt will start lasix daily and f/u 2 weeks with pcp or sooner prn

## 2016-08-23 ENCOUNTER — Ambulatory Visit (INDEPENDENT_AMBULATORY_CARE_PROVIDER_SITE_OTHER): Payer: Medicare Other | Admitting: General Practice

## 2016-08-23 ENCOUNTER — Encounter: Payer: Self-pay | Admitting: Family Medicine

## 2016-08-23 DIAGNOSIS — Z5181 Encounter for therapeutic drug level monitoring: Secondary | ICD-10-CM

## 2016-08-23 DIAGNOSIS — Z86718 Personal history of other venous thrombosis and embolism: Secondary | ICD-10-CM

## 2016-08-23 NOTE — Telephone Encounter (Signed)
Pt seen by Dr. Carollee Herter yesterday for hospital follow up.

## 2016-08-23 NOTE — Patient Instructions (Signed)
Pre visit review using our clinic review tool, if applicable. No additional management support is needed unless otherwise documented below in the visit note. 

## 2016-08-24 ENCOUNTER — Other Ambulatory Visit: Payer: Self-pay | Admitting: Family Medicine

## 2016-08-24 ENCOUNTER — Ambulatory Visit (HOSPITAL_BASED_OUTPATIENT_CLINIC_OR_DEPARTMENT_OTHER)
Admission: RE | Admit: 2016-08-24 | Discharge: 2016-08-24 | Disposition: A | Discharge status: Home / Self Care | Payer: Medicare Other | Source: Ambulatory Visit | Attending: Family Medicine | Admitting: Family Medicine

## 2016-08-24 ENCOUNTER — Encounter (HOSPITAL_BASED_OUTPATIENT_CLINIC_OR_DEPARTMENT_OTHER): Payer: Self-pay

## 2016-08-24 DIAGNOSIS — I10 Essential (primary) hypertension: Secondary | ICD-10-CM | POA: Diagnosis present

## 2016-08-24 DIAGNOSIS — R600 Localized edema: Secondary | ICD-10-CM

## 2016-08-24 DIAGNOSIS — Z1231 Encounter for screening mammogram for malignant neoplasm of breast: Secondary | ICD-10-CM

## 2016-08-24 DIAGNOSIS — I422 Other hypertrophic cardiomyopathy: Secondary | ICD-10-CM | POA: Insufficient documentation

## 2016-08-24 DIAGNOSIS — I080 Rheumatic disorders of both mitral and aortic valves: Secondary | ICD-10-CM | POA: Insufficient documentation

## 2016-08-24 HISTORY — DX: Malignant neoplasm of unspecified site of unspecified female breast: C50.919

## 2016-08-24 NOTE — Progress Notes (Signed)
  Echocardiogram 2D Echocardiogram has been performed.  Jennette Dubin 08/24/2016, 10:50 AM

## 2016-08-24 NOTE — Progress Notes (Signed)
  Echocardiogram 2D Echocardiogram has been performed.  [name] Little 08/24/2016, 10:49 AM

## 2016-08-24 NOTE — Telephone Encounter (Signed)
Please schedule   PC

## 2016-08-25 ENCOUNTER — Other Ambulatory Visit: Payer: Self-pay | Admitting: Family Medicine

## 2016-08-25 DIAGNOSIS — I340 Nonrheumatic mitral (valve) insufficiency: Secondary | ICD-10-CM

## 2016-08-28 ENCOUNTER — Ambulatory Visit: Payer: Medicare Other

## 2016-08-28 NOTE — Telephone Encounter (Signed)
Pt has been scheduled.  °

## 2016-09-01 ENCOUNTER — Encounter: Payer: Self-pay | Admitting: Family Medicine

## 2016-09-01 ENCOUNTER — Ambulatory Visit (INDEPENDENT_AMBULATORY_CARE_PROVIDER_SITE_OTHER): Payer: Medicare Other | Admitting: Family Medicine

## 2016-09-01 VITALS — BP 130/70 | HR 77 | Temp 97.9°F | Resp 18 | Wt 161.8 lb

## 2016-09-01 DIAGNOSIS — E669 Obesity, unspecified: Secondary | ICD-10-CM | POA: Diagnosis not present

## 2016-09-01 DIAGNOSIS — I340 Nonrheumatic mitral (valve) insufficiency: Secondary | ICD-10-CM

## 2016-09-01 DIAGNOSIS — I10 Essential (primary) hypertension: Secondary | ICD-10-CM

## 2016-09-01 DIAGNOSIS — E1169 Type 2 diabetes mellitus with other specified complication: Secondary | ICD-10-CM | POA: Diagnosis not present

## 2016-09-01 DIAGNOSIS — N390 Urinary tract infection, site not specified: Secondary | ICD-10-CM

## 2016-09-01 DIAGNOSIS — E782 Mixed hyperlipidemia: Secondary | ICD-10-CM

## 2016-09-01 HISTORY — DX: Nonrheumatic mitral (valve) insufficiency: I34.0

## 2016-09-01 LAB — CBC
HCT: 41.7 % (ref 36.0–46.0)
Hemoglobin: 13.6 g/dL (ref 12.0–15.0)
MCHC: 32.6 g/dL (ref 30.0–36.0)
MCV: 91.9 fl (ref 78.0–100.0)
Platelets: 449 10*3/uL — ABNORMAL HIGH (ref 150.0–400.0)
RBC: 4.54 Mil/uL (ref 3.87–5.11)
RDW: 14.9 % (ref 11.5–15.5)
WBC: 6.6 10*3/uL (ref 4.0–10.5)

## 2016-09-01 LAB — LIPID PANEL
Cholesterol: 159 mg/dL (ref 0–200)
HDL: 35.1 mg/dL — ABNORMAL LOW (ref >39.00)
LDL Cholesterol: 98 mg/dL (ref 0–99)
NonHDL: 124.25
Total CHOL/HDL Ratio: 5
Triglycerides: 129 mg/dL (ref 0.0–149.0)
VLDL: 25.8 mg/dL (ref 0.0–40.0)

## 2016-09-01 LAB — COMPREHENSIVE METABOLIC PANEL
ALT: 14 U/L (ref 0–35)
AST: 18 U/L (ref 0–37)
Albumin: 3.8 g/dL (ref 3.5–5.2)
Alkaline Phosphatase: 71 U/L (ref 39–117)
BUN: 22 mg/dL (ref 6–23)
CO2: 25 mEq/L (ref 19–32)
Calcium: 9.2 mg/dL (ref 8.4–10.5)
Chloride: 109 mEq/L (ref 96–112)
Creatinine, Ser: 0.8 mg/dL (ref 0.40–1.20)
GFR: 73.22 mL/min (ref >60.00)
Glucose, Bld: 93 mg/dL (ref 70–99)
Potassium: 4.1 mEq/L (ref 3.5–5.1)
Sodium: 140 mEq/L (ref 135–145)
Total Bilirubin: 0.8 mg/dL (ref 0.2–1.2)
Total Protein: 7.4 g/dL (ref 6.0–8.3)

## 2016-09-01 LAB — URINALYSIS, ROUTINE W REFLEX MICROSCOPIC
Bilirubin Urine: NEGATIVE
Hgb urine dipstick: NEGATIVE
Ketones, ur: NEGATIVE
Nitrite: NEGATIVE
Specific Gravity, Urine: 1.02 (ref 1.000–1.030)
Total Protein, Urine: NEGATIVE
Urine Glucose: NEGATIVE
Urobilinogen, UA: 0.2 (ref 0.0–1.0)
pH: 6 (ref 5.0–8.0)

## 2016-09-01 LAB — TSH: TSH: 3.99 u[IU]/mL (ref 0.35–4.50)

## 2016-09-01 LAB — HEMOGLOBIN A1C: Hgb A1c MFr Bld: 6.6 % — ABNORMAL HIGH (ref 4.6–6.5)

## 2016-09-01 NOTE — Assessment & Plan Note (Signed)
Encouraged heart healthy diet, increase exercise, avoid trans fats, consider a krill oil cap daily 

## 2016-09-01 NOTE — Assessment & Plan Note (Signed)
Good weight loss since last visit using portion control and low carb diet

## 2016-09-01 NOTE — Progress Notes (Signed)
Subjective:  I acted as a Education administrator for Dr. Charlett Blake. [place], [place]  Patient ID: [name] Little, female    DOB: 09-24-1933, 83 y.o.   MRN: [id number]  No chief complaint on file.   HPI  Patient is in today for a follow up. Patient presently has no acute concerns. No recent febrile illness or acute hospitalizations. Denies CP/palp/SOB/HA/congestion/fevers/GI or GU c/o. Taking meds as prescribed. Accompanied by her daughter. Feels well this am. No recurrent symptosm since returning home from hosptial. Is eating well. Denies CP/palp/SOB/HA/congestion/fevers/GI or GU c/o. Taking meds as prescribed   Patient Care Team: Mosie Lukes, MD as PCP - General (Family Medicine) Zadie Rhine Clent Demark, MD as Consulting Physician (Ophthalmology)   Past Medical History:  Diagnosis Date  . Anxiety state, unspecified 09/22/2013  . Arthritis    lumbar stenosis, radiculopathy, OA- L hip  . BCC (basal cell carcinoma), scalp/neck 09/27/2015  . Blood clotting disorder (HCC)    V5 factor  . Blood dyscrasia    factor 5 deficiency  . Breast cancer ([facility]) 2010   RIGHT breast  . Clostridium difficile infection   . Clotting disorder ([facility])   . Diabetes mellitus type 2 in obese ([facility]) 02/26/2014  . DVT (deep venous thrombosis) ([facility])   . 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 Hemorrhoids   . Hyperglycemia 02/26/2014  . Hyperlipemia   . Hypertension   . Infiltrating ductal carcinoma of breast ([facility])    stage one right   . Low back pain 03/26/2014  . Macular degeneration   . Mitral valve regurgitation 09/01/2016  . Mixed hyperlipidemia 05/18/2015  . Nocturia 02/26/2014  . Paroxysmal supraventricular tachycardia ([facility])   . Personal history of colonic polyps    hyperplastic  . Thoracic aortic aneurysm ([facility])   . TMJ pain dysfunction syndrome     Past Surgical History:  Procedure Laterality Date  . ABDOMINAL HYSTERECTOMY     1960- ? vaginal hysterectomy, not laparoscopic   . BREAST LUMPECTOMY  2010   right Dr. [name]  .  COLONOSCOPY    . EYE SURGERY     bilateral cataracts- /w IOL   . JOINT REPLACEMENT     R hip replacement   . LAPAROSCOPIC HYSTERECTOMY    . LUMBAR LAMINECTOMY/DECOMPRESSION MICRODISCECTOMY  04/11/2011   Procedure: LUMBAR LAMINECTOMY/DECOMPRESSION MICRODISCECTOMY;  Surgeon: [name], MD;  Location: [facility];  Service: Neurosurgery;  Laterality: N/A;  Lumbar Five-Sacral One Microdiscectomy  . right calf surgery     for cancer  . SPLENECTOMY    . TOTAL HIP ARTHROPLASTY  1999   right  . UPPER GASTROINTESTINAL ENDOSCOPY      Family History  Problem Relation Age of Onset  . Heart disease Mother   . Heart disease Father   . Alzheimer's disease Sister   . Heart disease Unknown        uncles  . Clotting disorder Maternal Uncle   . Breast cancer Paternal Aunt   . Cancer Paternal Aunt        breast  . Colon cancer Neg Hx   . Anesthesia problems Neg Hx   . Hypotension Neg Hx   . Malignant hyperthermia Neg Hx   . Pseudochol deficiency Neg Hx     Social History   Social History  . Marital status: Married    Spouse name: N/A  . Number of children: 4  . Years of education: N/A   Occupational History  . Retired    Social History  Main Topics  . Smoking status: Never Smoker  . Smokeless tobacco: Never Used  . Alcohol use No  . Drug use: No  . Sexual activity: No   Other Topics Concern  . Not on file   Social History Narrative   Married 1955   2 sons - '59, '61, 2 daughters '55, '57; 8 grandchildren; 1 great grand   I-ADLs          Outpatient Medications Prior to Visit  Medication Sig Dispense Refill  . ALPRAZolam (XANAX) 0.25 MG tablet TAKE ONE TABLET BY MOUTH TWICE DIALY AS NEEDED FOR ANXIETY 20 tablet 1  . B Complex Vitamins (VITAMIN B COMPLEX PO) Take 1 tablet by mouth daily.     . Blood Glucose Monitoring Suppl (ONE TOUCH ULTRA 2) w/Device KIT See admin instructions.  0  . colesevelam (WELCHOL) 625 MG tablet Take 1 tablet (625 mg total) by mouth 2 (two)  times daily with a meal. 60 tablet 0  . furosemide (LASIX) 20 MG tablet Take 1 tablet (20 mg total) by mouth daily as needed for fluid or edema. 30 tablet 1  . glucose blood (IGLUCOSE TEST STRIPS) test strip Test Blood Sugars every day as needed 100 each 5  . glucose monitoring kit (FREESTYLE) monitoring kit 1 each by Does not apply route as needed for other. 1 each 0  . Lancets MISC Test Blood Sugars every day as needed 100 each 5  . losartan (COZAAR) 100 MG tablet TAKE 1 TABLET (100 MG TOTAL) BY MOUTH DAILY. 90 tablet 1  . Multiple Vitamins-Minerals (MULTIVITAMIN ADULTS) TABS Take 1 tablet by mouth daily.    . potassium chloride SA (K-DUR,KLOR-CON) 20 MEQ tablet Take 1 tablet (20 mEq total) by mouth daily as needed (lasix use). 30 tablet 0  . warfarin (COUMADIN) 5 MG tablet TAKE AS DIRECTED BY COUMADIN CLINIC. (Patient taking differently: Take 1 tablet (5 mg) by mouth daily except on Mon Take 0.5 tablet (2.5 mg)) 90 tablet 3   No facility-administered medications prior to visit.     Allergies  Allergen Reactions  . Amlodipine Swelling    "my whole body swelled"  . Carvedilol Swelling    "my whole body swelled"  . Cefuroxime Axetil     REACTION: Ddoes not remember reaction  . Chlorthalidone     Patient reported extreme weakness.  . Statins     Weakness, pain Lipitor and Pravachol  . Sulfonamide Derivatives     REACTION: Rash    Review of Systems  Constitutional: Positive for malaise/fatigue. Negative for fever.  HENT: Negative for congestion.   Eyes: Negative for blurred vision.  Respiratory: Negative for cough and shortness of breath.   Cardiovascular: Negative for chest pain, palpitations and leg swelling.  Gastrointestinal: Negative for vomiting.  Musculoskeletal: Negative for back pain.  Skin: Negative for rash.  Neurological: Negative for loss of consciousness and headaches.       Objective:    Physical Exam  Constitutional: She is oriented to person, place, and  time. She appears well-developed and well-nourished. No distress.  HENT:  Head: Normocephalic and atraumatic.  Eyes: Conjunctivae are normal.  Neck: Normal range of motion. No thyromegaly present.  Cardiovascular: Normal rate.   Murmur heard. Irregularly irregular   Pulmonary/Chest: Effort normal and breath sounds normal. She has no wheezes.  Abdominal: Soft. Bowel sounds are normal. There is no tenderness.  Musculoskeletal: Normal range of motion. She exhibits no edema or deformity.  Neurological: She is alert and  oriented to person, place, and time.  Skin: Skin is warm and dry. She is not diaphoretic.  Psychiatric: She has a normal mood and affect.    BP 130/70 (BP Location: Left Arm, Patient Position: Sitting, Cuff Size: Normal)   Pulse 77   Temp 97.9 F (36.6 C) (Oral)   Resp 18   Wt 161 lb 12.8 oz (73.4 kg)   SpO2 97%   BMI 28.66 kg/m  Wt Readings from Last 3 Encounters:  09/01/16 161 lb 12.8 oz (73.4 kg)  08/22/16 167 lb 3.2 oz (75.8 kg)  08/15/16 166 lb 7.2 oz (75.5 kg)   BP Readings from Last 3 Encounters:  09/01/16 130/70  08/22/16 (!) 152/68  08/17/16 (!) 143/67     Immunization History  Administered Date(s) Administered  . Influenza Split 11/14/2010  . Influenza Whole 11/08/2005, 10/28/2007, 11/23/2008, 11/26/2009  . Influenza, High Dose Seasonal PF 10/19/2015  . Influenza,inj,Quad PF,36+ Mos 11/26/2012, 09/22/2013, 10/20/2014  . Pneumococcal Conjugate-13 04/01/2013  . Pneumococcal Polysaccharide-23 10/30/2001, 07/27/2009  . Td 11/27/2001  . Tetanus 04/01/2013    Health Maintenance  Topic Date Due  . OPHTHALMOLOGY EXAM  08/10/2015  . INFLUENZA VACCINE  08/30/2016  . HEMOGLOBIN A1C  01/24/2017  . FOOT EXAM  04/17/2017  . TETANUS/TDAP  04/02/2023  . DEXA SCAN  Completed  . PNA vac Low Risk Adult  Completed    Lab Results  Component Value Date   WBC 5.1 08/17/2016   HGB 13.0 08/17/2016   HCT 40.0 08/17/2016   PLT 201 08/17/2016   GLUCOSE 86  08/17/2016   CHOL 230 (H) 07/25/2016   TRIG 235.0 (H) 07/25/2016   HDL 33.10 (L) 07/25/2016   LDLDIRECT 143.0 07/25/2016   LDLCALC 154 (H) 04/17/2016   ALT 33 08/16/2016   AST 60 (H) 08/16/2016   NA 138 08/17/2016   K 3.8 08/17/2016   CL 109 08/17/2016   CREATININE 0.80 08/17/2016   BUN 18 08/17/2016   CO2 23 08/17/2016   TSH 0.876 08/16/2016   INR 2.5 08/22/2016   HGBA1C 6.6 (H) 07/25/2016   MICROALBUR 1.5 10/20/2014    Lab Results  Component Value Date   TSH 0.876 08/16/2016   Lab Results  Component Value Date   WBC 5.1 08/17/2016   HGB 13.0 08/17/2016   HCT 40.0 08/17/2016   MCV 91.5 08/17/2016   PLT 201 08/17/2016   Lab Results  Component Value Date   NA 138 08/17/2016   K 3.8 08/17/2016   CHLORIDE 109 08/12/2013   CO2 23 08/17/2016   GLUCOSE 86 08/17/2016   BUN 18 08/17/2016   CREATININE 0.80 08/17/2016   BILITOT 0.8 08/16/2016   ALKPHOS 59 08/16/2016   AST 60 (H) 08/16/2016   ALT 33 08/16/2016   PROT 6.8 08/16/2016   ALBUMIN 3.4 (L) 08/16/2016   CALCIUM 8.1 (L) 08/17/2016   ANIONGAP 6 08/17/2016   GFR 66.48 07/25/2016   Lab Results  Component Value Date   CHOL 230 (H) 07/25/2016   Lab Results  Component Value Date   HDL 33.10 (L) 07/25/2016   Lab Results  Component Value Date   LDLCALC 154 (H) 04/17/2016   Lab Results  Component Value Date   TRIG 235.0 (H) 07/25/2016   Lab Results  Component Value Date   CHOLHDL 7 07/25/2016   Lab Results  Component Value Date   HGBA1C 6.6 (H) 07/25/2016         Assessment & Plan:   Problem List Items Addressed  This Visit    Hyperlipidemia    Encouraged heart healthy diet, increase exercise, avoid trans fats, consider a krill oil cap daily      Relevant Orders   Lipid panel   Essential hypertension    Well controlled, no changes to meds. Encouraged heart healthy diet such as the DASH diet and exercise as tolerated.       Relevant Orders   CBC   Comprehensive metabolic panel   TSH    Obesity (BMI 30-39.9)    Good weight loss since last visit using portion control and low carb diet      Diabetes mellitus type 2 in obese (HCC)    hgba1c acceptable, minimize simple carbs. Increase exercise as tolerated. Continue current meds. Home fasting numbers running from 90 to 110 routinely.       Relevant Orders   Hemoglobin A1c   UTI (urinary tract infection) - Primary   Relevant Orders   Urinalysis   Urine Culture   Mitral valve regurgitation      I am having Ms. Haegele maintain her B Complex Vitamins (VITAMIN B COMPLEX PO), potassium chloride SA, losartan, warfarin, ALPRAZolam, glucose monitoring kit, Lancets, glucose blood, colesevelam, MULTIVITAMIN ADULTS, ONE TOUCH ULTRA 2, and furosemide.  No orders of the defined types were placed in this encounter.   CMA served as Education administrator during this visit. History, Physical and Plan performed by medical provider. Documentation and orders reviewed and attested to.  Penni Homans, MD

## 2016-09-01 NOTE — Patient Instructions (Signed)
Mitral Valve Regurgitation Mitral valve regurgitation, also called mitral regurgitation, is a condition in which blood leaks from the mitral valve in the heart. The mitral valve is located between the upper left chamber (left atrium) and the lower left chamber (left ventricle) of the heart. Normally, this valve opens when the atrium pumps blood into the ventricle, and it closes when the ventricle pumps blood out to the body. Mitral valve regurgitation happens when the mitral valve does not close properly. As a result, blood in the ventricle leaks back into the atrium. Mitral valve regurgitation causes the heart to work harder to pump blood. If the condition is mild, a person may not have symptoms. However, over time, this can lead to heart failure. What are the causes? This condition may be caused by:  A condition in which the mitral valves do not close completely when the heart pumps blood (mitral valve prolapse).  Infection, such as endocarditis or rheumatic fever.  Damage to the mitral valve, such as from injury (trauma) to the heart, a problem present at birth (birth defect), or a heart attack.  Certain medicines.  What increases the risk? This condition is more likely to develop in people who have:  Certain forms of heart disease.  A family history of heart valve disease.  Certain conditions that are present at birth (congenital).  You are also more likely to develop this condition if you have taken certain diet pills in the past. What are the signs or symptoms? Symptoms of this condition include:  Shortness of breath with physical activity, like climbing stairs.  Fast or irregular heartbeat.  Cough.  Suddenly waking up at night with difficulty breathing or needing to urinate.  Heavy breathing.  Extreme tiredness.  Swelling in the lower legs, ankles, and feet.  In some cases of mild to moderate mitral regurgitation, there are no symptoms. How is this diagnosed? This  condition may be diagnosed based on the results of a physical exam. Your health care provider will listen to your heart for an abnormal heart sound (murmur). You may also have other tests, including:  An echocardiogram. This test creates ultrasound images of the heart that allow your health care provider to see how the heart valves work while your heart is beating.  Chest X-ray.  Electrocardiogram (ECG). This is a test that records the electrical impulses of the heart.  Cardiac catheterization. This test is used to look at the structure and function of the heart. A thin tube (catheter) is passed through the blood vessels and into the heart. Dye is injected into the blood vessels so the cardiac system can be seen on images that are taken.  How is this treated? This condition may be treated with:  Medicines. These may be given to treat symptoms and prevent complications.  Surgery to repair or replace the mitral valve in severe, long-term (chronic) cases.  Follow these instructions at home: Lifestyle  Limit alcohol intake to no more than 1 drink a day for nonpregnant women and 2 drinks a day for men. One drink equals 12 oz of beer, 5 oz of wine, or 1 oz of hard liquor.  Do not use any products that contain nicotine or tobacco, such as cigarettes and e-cigarettes. If you need help quitting, ask your health care provider.  Eat a heart-healthy diet that includes plenty of fresh fruits and vegetables, whole grains, low-fat (lean) protein, and low-fat dairy products. Consider working with a diet and nutrition specialist (dietitian) to help  you make healthy food choices.  Limit the amount of salt (sodium) in your diet. Avoid adding salt to foods, and avoid foods that are high in salt, such as: ? Pickles. ? Smoked and cured meats. ? Processed foods.  Maintain a healthy weight and stay physically active. Ask your health care provider to recommend activities that are safe for you.  Try to get 7  or more hours of sleep each night.  Find ways to manage stress. If you need help with this, ask your health care provider. General instructions   Take over-the-counter and prescription medicines only as told by your health care provider.  Work closely with your health care provider to manage any other health conditions you have, such as diabetes or high blood pressure.  If you plan to become pregnant, talk with your health care provider first.  Keep all follow-up visits as told by your health care provider. This is important. Contact a health care provider if:  You have a fever.  You feel more tired than usual when doing physical activity.  You have a dry cough. Get help right away if:  You have shortness of breath.  You develop chest pain.  You have swelling in your hands, feet, ankles, or abdomen that is getting worse.  You have trouble staying awake or you faint.  You feel dizzy or unsteady.  You suddenly gain weight.  You feel confused.  Any of your symptoms begin to get worse. These symptoms may represent a serious problem that is an emergency. Do not wait to see if the symptoms will go away. Get medical help right away. Call your local emergency services (911 in the [place] Do not drive yourself to the hospital. Summary  Mitral valve regurgitation, also called mitral regurgitation, is a condition in which blood leaks from a valve between two chambers of the heart (mitral valve).  Depending on how severe your condition is, you may be treated with medicines or surgery.  Practice heart-healthy habits to manage this condition. These include limiting alcohol, avoiding nicotine and tobacco, and eating a balanced diet that is low in salt (sodium). This information is not intended to replace advice given to you by your health care provider. Make sure you discuss any questions you have with your health care provider. Document Released: 04/05/2004 Document Revised: 10/29/2015  Document Reviewed: 10/29/2015 Elsevier Interactive Patient Education  [employer].

## 2016-09-01 NOTE — Assessment & Plan Note (Signed)
hgba1c acceptable, minimize simple carbs. Increase exercise as tolerated. Continue current meds. Home fasting numbers running from 90 to 110 routinely.

## 2016-09-01 NOTE — Assessment & Plan Note (Signed)
Well controlled, no changes to meds. Encouraged heart healthy diet such as the DASH diet and exercise as tolerated.  °

## 2016-09-02 LAB — URINE CULTURE: Organism ID, Bacteria: NO GROWTH

## 2016-09-05 ENCOUNTER — Other Ambulatory Visit: Payer: Self-pay | Admitting: Family Medicine

## 2016-09-05 DIAGNOSIS — I10 Essential (primary) hypertension: Secondary | ICD-10-CM

## 2016-09-05 NOTE — Progress Notes (Signed)
Cardiology Office Note   Date:  09/06/2016   ID:  [name] Little, DOB [birth year], MRN [id number]  PCP:  Mosie Lukes, MD  Cardiologist:   Jenkins Rouge, MD   No chief complaint on file.     History of Present Illness: [name] Little is a 83 y.o. female who presents for consultation regarding mitral valve disease. 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 CRF;s HTN, elevated lipids and DM.  Intolerant to statins with lipitor and pravachol causing weakness. Echo done 08/24/16 for edema reviewed She has no history of chest pain palpitations PND/Orhtopnea rheumatic fever or SBE  Study Conclusions  - Left ventricle: The cavity size was normal. There was mild   concentric hypertrophy. Systolic function was normal. The   estimated ejection fraction was in the range of 55% to 60%. Wall   motion was normal; there were no regional wall motion   abnormalities. - Aortic root: The aortic root was mildly dilated. - Mitral valve: There was mild to moderate regurgitation. - Left atrium: The atrium was mildly dilated.  Admitted to hospital 7/17 with tachycardia and fever 102 and had UTI E coli BC;s were negative  No cardiac complaints Married 63 years has 4 children that look after her and Daughter with her today Still drives and mows 2.5 acres at home in [name] Understands that salt indiscretion leads to LE edema Does not take her lasix daily   Past Medical History:  Diagnosis Date  . Anxiety state, unspecified 09/22/2013  . Arthritis    lumbar stenosis, radiculopathy, OA- L hip  . BCC (basal cell carcinoma), scalp/neck 09/27/2015  . Blood clotting disorder (HCC)    V5 factor  . Blood dyscrasia    factor 5 deficiency  . Breast cancer ([name]) 2010   RIGHT breast  . Clostridium difficile infection   . Clotting disorder ([name])   . Diabetes mellitus type 2 in obese ([name]) 02/26/2014  . DVT (deep venous thrombosis) ([name])   . Factor V deficiency ([name])   .  Hemorrhoids   . Hyperglycemia 02/26/2014  . Hyperlipemia   . Hypertension   . Infiltrating ductal carcinoma of breast ([name])    stage one right   . Low back pain 03/26/2014  . Macular degeneration   . Mitral valve regurgitation 09/01/2016  . Mixed hyperlipidemia 05/18/2015  . Nocturia 02/26/2014  . Paroxysmal supraventricular tachycardia ([name])   . Personal history of colonic polyps    hyperplastic  . Thoracic aortic aneurysm ([name])   . TMJ pain dysfunction syndrome     Past Surgical History:  Procedure Laterality Date  . ABDOMINAL HYSTERECTOMY     1960- ? vaginal hysterectomy, not laparoscopic   . BREAST LUMPECTOMY  2010   right Dr. [name]  . COLONOSCOPY    . EYE SURGERY     bilateral cataracts- /w IOL   . JOINT REPLACEMENT     R hip replacement   . LAPAROSCOPIC HYSTERECTOMY    . LUMBAR LAMINECTOMY/DECOMPRESSION MICRODISCECTOMY  04/11/2011   Procedure: LUMBAR LAMINECTOMY/DECOMPRESSION MICRODISCECTOMY;  Surgeon: [name], MD;  Location: [facility];  Service: Neurosurgery;  Laterality: N/A;  Lumbar Five-Sacral One Microdiscectomy  . right calf surgery     for cancer  . SPLENECTOMY    . TOTAL HIP ARTHROPLASTY  1999   right  . UPPER GASTROINTESTINAL ENDOSCOPY       Current Outpatient Prescriptions  Medication Sig Dispense Refill  . ALPRAZolam (XANAX) 0.25  MG tablet TAKE ONE TABLET BY MOUTH TWICE DIALY AS NEEDED FOR ANXIETY 20 tablet 1  . B Complex Vitamins (VITAMIN B COMPLEX PO) Take 1 tablet by mouth daily.     . Blood Glucose Monitoring Suppl (ONE TOUCH ULTRA 2) w/Device KIT See admin instructions.  0  . colesevelam (WELCHOL) 625 MG tablet Take 1 tablet (625 mg total) by mouth 2 (two) times daily with a meal. 60 tablet 0  . furosemide (LASIX) 20 MG tablet Take 1 tablet (20 mg total) by mouth daily as needed for fluid or edema. 30 tablet 1  . glucose blood (IGLUCOSE TEST STRIPS) test strip Test Blood Sugars every day as needed 100 each 5  . glucose monitoring kit  (FREESTYLE) monitoring kit 1 each by Does not apply route as needed for other. 1 each 0  . Lancets MISC Test Blood Sugars every day as needed 100 each 5  . losartan (COZAAR) 100 MG tablet TAKE 1 TABLET (100 MG TOTAL) BY MOUTH DAILY. 90 tablet 1  . Multiple Vitamins-Minerals (MULTIVITAMIN ADULTS) TABS Take 1 tablet by mouth daily.    . potassium chloride SA (K-DUR,KLOR-CON) 20 MEQ tablet Take 1 tablet (20 mEq total) by mouth daily as needed (lasix use). 30 tablet 0  . warfarin (COUMADIN) 5 MG tablet TAKE AS DIRECTED BY COUMADIN CLINIC. (Patient taking differently: Take 1 tablet (5 mg) by mouth daily except on Mon Take 0.5 tablet (2.5 mg)) 90 tablet 3   No current facility-administered medications for this visit.     Allergies:   Amlodipine; Carvedilol; Cefuroxime axetil; Chlorthalidone; Statins; and Sulfonamide derivatives    Social History:  The patient  reports that she has never smoked. She has never used smokeless tobacco. She reports that she does not drink alcohol or use drugs.   Family History:  The patient's family history includes Alzheimer's disease in her sister; Breast cancer in her paternal aunt; Cancer in her paternal aunt; Clotting disorder in her maternal uncle; Heart disease in her father, mother, and unknown relative.    ROS:  Please see the history of present illness.   Otherwise, review of systems are positive for none.   All other systems are reviewed and negative.    PHYSICAL EXAM: VS:  BP 128/80   Pulse 71   Ht '5\' 3"'$  (1.6 m)   Wt 161 lb 6 oz (73.2 kg)   SpO2 94%   BMI 28.59 kg/m  , BMI Body mass index is 28.59 kg/m. Affect appropriate Healthy:  appears stated age 59: normal Neck supple with no adenopathy JVP normal no bruits no thyromegaly Lungs clear with no wheezing and good diaphragmatic motion Heart:  S1/S2 soft apical MR murmur, no rub, gallop or click PMI normal Abdomen: benighn, BS positve, no tenderness, no AAA no bruit.  No HSM or HJR Distal  pulses intact with no bruits Plus one bilateral ankle  edema Neuro non-focal Skin warm and dry No muscular weakness    EKG:  ST rate 130 LAD LVH    Recent Labs: 09/01/2016: ALT 14; BUN 22; Creatinine, Ser 0.80; Hemoglobin 13.6; Platelets 449.0; Potassium 4.1; Sodium 140; TSH 3.99    Lipid Panel    Component Value Date/Time   CHOL 159 09/01/2016 0809   TRIG 129.0 09/01/2016 0809   HDL 35.10 (L) 09/01/2016 0809   CHOLHDL 5 09/01/2016 0809   VLDL 25.8 09/01/2016 0809   LDLCALC 98 09/01/2016 0809   LDLDIRECT 143.0 07/25/2016 0955      Wt  Readings from Last 3 Encounters:  09/06/16 161 lb 6 oz (73.2 kg)  09/01/16 161 lb 12.8 oz (73.4 kg)  08/22/16 167 lb 3.2 oz (75.8 kg)      Other studies Reviewed: Additional studies/ records that were reviewed today include: Notes Dr [name] Echo done July 2018 ECG's .    ASSESSMENT AND PLAN:  1. MR- mild to moderate not clinically significant no need for SBE f/u in a year 2. HTN Well controlled.  Continue current medications and low sodium Dash type diet.   3. DM:  Discussed low carb diet.  Target hemoglobin A1c is 6.5 or less.  Continue current medications. 4. Cholesterol continue welchol  intolerant to statins  5. Hypercoagulability: on coumadin followed by primary  6. Anxiety:  Seems stable  7. Edema: dependant from venous disease cut out salt in chicken tenders PRN Lasix    Current medicines are reviewed at length with the patient today.  The patient does not have concerns regarding medicines.  The following changes have been made:  no change  Labs/ tests ordered today include: None No orders of the defined types were placed in this encounter.    Disposition:   FU with me in a year      Signed, Jenkins Rouge, MD  09/06/2016 2:30 PM    [address], [place], [place]  [address] Phone: [phone]; Fax: [phone]

## 2016-09-06 ENCOUNTER — Encounter: Payer: Self-pay | Admitting: Cardiovascular Disease

## 2016-09-06 ENCOUNTER — Other Ambulatory Visit (HOSPITAL_BASED_OUTPATIENT_CLINIC_OR_DEPARTMENT_OTHER): Payer: Medicare Other

## 2016-09-06 ENCOUNTER — Ambulatory Visit (INDEPENDENT_AMBULATORY_CARE_PROVIDER_SITE_OTHER): Payer: Medicare Other | Admitting: Cardiovascular Disease

## 2016-09-06 VITALS — BP 128/80 | HR 71 | Ht 63.0 in | Wt 161.4 lb

## 2016-09-06 DIAGNOSIS — I340 Nonrheumatic mitral (valve) insufficiency: Secondary | ICD-10-CM | POA: Diagnosis not present

## 2016-09-06 NOTE — Patient Instructions (Signed)
Medication Instructions:  Your physician recommends that you continue on your current medications as directed. Please refer to the Current Medication list given to you today.   Labwork: None ordered  Testing/Procedures: None ordered  Follow-Up: Your physician wants you to follow-up in: 1 year with Dr. Nishan. You will receive a reminder letter in the mail two months in advance. If you don't receive a letter, please call our office to schedule the follow-up appointment.   Any Other Special Instructions Will Be Listed Below (If Applicable).     If you need a refill on your cardiac medications before your next appointment, please call your pharmacy.   

## 2016-09-18 ENCOUNTER — Other Ambulatory Visit: Payer: Self-pay | Admitting: Family Medicine

## 2016-09-18 ENCOUNTER — Telehealth: Payer: Self-pay | Admitting: Family Medicine

## 2016-09-18 NOTE — Telephone Encounter (Signed)
Pt would like to know when she should come in for her coumadin check.  A little confused from when they checked her INR in the hospital.

## 2016-09-18 NOTE — Telephone Encounter (Signed)
Noted.  I will check in with patient.  Thanks!

## 2016-09-18 NOTE — Telephone Encounter (Signed)
Faxed 30d Welchol to last till OV/thx dmf

## 2016-09-22 ENCOUNTER — Ambulatory Visit (INDEPENDENT_AMBULATORY_CARE_PROVIDER_SITE_OTHER): Payer: Medicare Other | Admitting: General Practice

## 2016-09-22 DIAGNOSIS — Z7901 Long term (current) use of anticoagulants: Secondary | ICD-10-CM | POA: Diagnosis not present

## 2016-09-22 DIAGNOSIS — Z5181 Encounter for therapeutic drug level monitoring: Secondary | ICD-10-CM | POA: Diagnosis not present

## 2016-09-22 DIAGNOSIS — Z86718 Personal history of other venous thrombosis and embolism: Secondary | ICD-10-CM | POA: Diagnosis not present

## 2016-09-22 LAB — POCT INR: INR: 2.5

## 2016-09-22 NOTE — Patient Instructions (Signed)
Pre visit review using our clinic review tool, if applicable. No additional management support is needed unless otherwise documented below in the visit note. 

## 2016-10-16 ENCOUNTER — Ambulatory Visit (INDEPENDENT_AMBULATORY_CARE_PROVIDER_SITE_OTHER): Payer: Medicare Other | Admitting: Family Medicine

## 2016-10-16 VITALS — BP 146/88 | HR 75 | Temp 98.1°F | Resp 18 | Wt 162.4 lb

## 2016-10-16 DIAGNOSIS — I10 Essential (primary) hypertension: Secondary | ICD-10-CM

## 2016-10-16 DIAGNOSIS — E669 Obesity, unspecified: Secondary | ICD-10-CM

## 2016-10-16 DIAGNOSIS — E1169 Type 2 diabetes mellitus with other specified complication: Secondary | ICD-10-CM

## 2016-10-16 DIAGNOSIS — I712 Thoracic aortic aneurysm, without rupture, unspecified: Secondary | ICD-10-CM

## 2016-10-16 DIAGNOSIS — Z23 Encounter for immunization: Secondary | ICD-10-CM | POA: Diagnosis not present

## 2016-10-16 DIAGNOSIS — R6510 Systemic inflammatory response syndrome (SIRS) of non-infectious origin without acute organ dysfunction: Secondary | ICD-10-CM

## 2016-10-16 DIAGNOSIS — E782 Mixed hyperlipidemia: Secondary | ICD-10-CM | POA: Diagnosis not present

## 2016-10-16 DIAGNOSIS — Z87440 Personal history of urinary (tract) infections: Secondary | ICD-10-CM | POA: Diagnosis not present

## 2016-10-16 NOTE — Assessment & Plan Note (Signed)
Asymptomatic today. 

## 2016-10-16 NOTE — Assessment & Plan Note (Signed)
Improved control, no changes to meds. Encouraged heart healthy diet such as the DASH diet and exercise as tolerated. Reports home BP numbers in the 120s/60s most days.

## 2016-10-16 NOTE — Progress Notes (Signed)
Subjective:  I acted as a Education administrator for Dr. Charlett Blake. [place], [place]  Patient ID: [name] Little, female    DOB: [birth year], 83 y.o.   MRN: [id number]  No chief complaint on file.   HPI  Patient is in today for a follow up. Patient is following up on her HTN, DM and other medical concerns. No recent febrile illness or acute hospitalizations. Denies palp/SOB/HA/congestion/fevers/GI or GU c/o. Taking meds as prescribed. She has recently been seen by cardiology and no significant changes were made to her medications. She mostly feels well but does acknowledge she has had episodes of some chest discomfort perhaps tightness but that tends to occur in certain positions and when she moves it resolves. No associated diaphoresis or nausea is seen. She continues to struggle with some malaise and weakness after her hospitalization in July but overall feels she is improving. Denies polyuria or polydipsia. Reports her blood pressure readings at home have been good generally in the 120s over 60s.   Patient Care Team: Mosie Lukes, MD as PCP - General (Family Medicine) Zadie Rhine Clent Demark, MD as Consulting Physician (Ophthalmology)   Past Medical History:  Diagnosis Date  . Anxiety state, unspecified 09/22/2013  . Arthritis    lumbar stenosis, radiculopathy, OA- L hip  . BCC (basal cell carcinoma), scalp/neck 09/27/2015  . Blood clotting disorder (HCC)    V5 factor  . Blood dyscrasia    factor 5 deficiency  . Breast cancer ([place]) 2010   RIGHT breast  . Clostridium difficile infection   . Clotting disorder ([place])   . Diabetes mellitus type 2 in obese ([place]) 02/26/2014  . DVT (deep venous thrombosis) ([place])   . Factor V deficiency ([place])   . Hemorrhoids   . Hyperglycemia 02/26/2014  . Hyperlipemia   . Hypertension   . Infiltrating ductal carcinoma of breast ([place])    stage one right   . Low back pain 03/26/2014  . Macular degeneration   . Mitral valve regurgitation 09/01/2016  . Mixed hyperlipidemia 05/18/2015    . Nocturia 02/26/2014  . Paroxysmal supraventricular tachycardia ([place])   . Personal history of colonic polyps    hyperplastic  . Thoracic aortic aneurysm ([place])   . TMJ pain dysfunction syndrome     Past Surgical History:  Procedure Laterality Date  . ABDOMINAL HYSTERECTOMY     1960- ? vaginal hysterectomy, not laparoscopic   . BREAST LUMPECTOMY  2010   right Dr. [name]  . COLONOSCOPY    . EYE SURGERY     bilateral cataracts- /w IOL   . JOINT REPLACEMENT     R hip replacement   . LAPAROSCOPIC HYSTERECTOMY    . LUMBAR LAMINECTOMY/DECOMPRESSION MICRODISCECTOMY  04/11/2011   Procedure: LUMBAR LAMINECTOMY/DECOMPRESSION MICRODISCECTOMY;  Surgeon: [name], MD;  Location: [facility];  Service: Neurosurgery;  Laterality: N/A;  Lumbar Five-Sacral One Microdiscectomy  . right calf surgery     for cancer  . SPLENECTOMY    . TOTAL HIP ARTHROPLASTY  1999   right  . UPPER GASTROINTESTINAL ENDOSCOPY      Family History  Problem Relation Age of Onset  . Heart disease Mother   . Heart disease Father   . Alzheimer's disease Sister   . Heart disease Unknown        uncles  . Clotting disorder Maternal Uncle   . Breast cancer Paternal Aunt   . Cancer Paternal Aunt        breast  . Colon cancer Neg  Hx   . Anesthesia problems Neg Hx   . Hypotension Neg Hx   . Malignant hyperthermia Neg Hx   . Pseudochol deficiency Neg Hx     Social History   Social History  . Marital status: Married    Spouse name: N/A  . Number of children: 4  . Years of education: N/A   Occupational History  . Retired    Social History Main Topics  . Smoking status: Never Smoker  . Smokeless tobacco: Never Used  . Alcohol use No  . Drug use: No  . Sexual activity: No   Other Topics Concern  . Not on file   Social History Narrative   Married 1955   2 sons - '59, '61, 2 daughters '55, '57; 8 grandchildren; 1 great grand   I-ADLs          Outpatient Medications Prior to Visit  Medication  Sig Dispense Refill  . ALPRAZolam (XANAX) 0.25 MG tablet TAKE ONE TABLET BY MOUTH TWICE DIALY AS NEEDED FOR ANXIETY 20 tablet 1  . B Complex Vitamins (VITAMIN B COMPLEX PO) Take 1 tablet by mouth daily.     . Blood Glucose Monitoring Suppl (ONE TOUCH ULTRA 2) w/Device KIT See admin instructions.  0  . colesevelam (WELCHOL) 625 MG tablet TAKE 1 TABLET BY MOUTH TWICE A DAY WITH MEALS 60 tablet 0  . furosemide (LASIX) 20 MG tablet Take 1 tablet (20 mg total) by mouth daily as needed for fluid or edema. 30 tablet 1  . glucose blood (IGLUCOSE TEST STRIPS) test strip Test Blood Sugars every day as needed 100 each 5  . glucose monitoring kit (FREESTYLE) monitoring kit 1 each by Does not apply route as needed for other. 1 each 0  . Lancets MISC Test Blood Sugars every day as needed 100 each 5  . losartan (COZAAR) 100 MG tablet TAKE 1 TABLET (100 MG TOTAL) BY MOUTH DAILY. 90 tablet 1  . Multiple Vitamins-Minerals (MULTIVITAMIN ADULTS) TABS Take 1 tablet by mouth daily.    . potassium chloride SA (K-DUR,KLOR-CON) 20 MEQ tablet Take 1 tablet (20 mEq total) by mouth daily as needed (lasix use). 30 tablet 0  . warfarin (COUMADIN) 5 MG tablet TAKE AS DIRECTED BY COUMADIN CLINIC. (Patient taking differently: Take 1 tablet (5 mg) by mouth daily except on Mon Take 0.5 tablet (2.5 mg)) 90 tablet 3   No facility-administered medications prior to visit.     Allergies  Allergen Reactions  . Amlodipine Swelling    "my whole body swelled"  . Carvedilol Swelling    "my whole body swelled"  . Cefuroxime Axetil     REACTION: Ddoes not remember reaction  . Chlorthalidone     Patient reported extreme weakness.  . Statins     Weakness, pain Lipitor and Pravachol  . Sulfonamide Derivatives     REACTION: Rash    Review of Systems  Constitutional: Positive for malaise/fatigue. Negative for fever.  HENT: Negative for congestion.   Eyes: Negative for blurred vision.  Respiratory: Negative for cough and shortness  of breath.   Cardiovascular: Positive for chest pain. Negative for palpitations and leg swelling.  Gastrointestinal: Negative for vomiting.  Musculoskeletal: Negative for back pain.  Skin: Negative for rash.  Neurological: Negative for loss of consciousness and headaches.       Objective:    Physical Exam  Constitutional: She is oriented to person, place, and time. She appears well-developed and well-nourished. No distress.  HENT:  Head: Normocephalic and atraumatic.  Eyes: Conjunctivae are normal.  Neck: Normal range of motion. No thyromegaly present.  Cardiovascular: Normal rate.   Murmur heard. Pulmonary/Chest: Effort normal and breath sounds normal. She has no wheezes.  Abdominal: Soft. Bowel sounds are normal. There is no tenderness.  Musculoskeletal: Normal range of motion. She exhibits no edema or deformity.  Neurological: She is alert and oriented to person, place, and time.  Skin: Skin is warm and dry. She is not diaphoretic.  Psychiatric: She has a normal mood and affect.    BP (!) 146/88   Pulse 75   Temp 98.1 F (36.7 C) (Oral)   Resp 18   Wt 162 lb 6.4 oz (73.7 kg)   SpO2 98%   BMI 28.77 kg/m  Wt Readings from Last 3 Encounters:  10/16/16 162 lb 6.4 oz (73.7 kg)  09/06/16 161 lb 6 oz (73.2 kg)  09/01/16 161 lb 12.8 oz (73.4 kg)   BP Readings from Last 3 Encounters:  10/16/16 (!) 146/88  09/06/16 128/80  09/01/16 130/70     Immunization History  Administered Date(s) Administered  . Influenza Split 11/14/2010  . Influenza Whole 11/08/2005, 10/28/2007, 11/23/2008, 11/26/2009  . Influenza, High Dose Seasonal PF 10/19/2015, 10/16/2016  . Influenza,inj,Quad PF,6+ Mos 11/26/2012, 09/22/2013, 10/20/2014  . Pneumococcal Conjugate-13 04/01/2013  . Pneumococcal Polysaccharide-23 10/30/2001, 07/27/2009  . Td 11/27/2001  . Tetanus 04/01/2013    Health Maintenance  Topic Date Due  . OPHTHALMOLOGY EXAM  08/10/2015  . INFLUENZA VACCINE  08/30/2016  .  HEMOGLOBIN A1C  03/04/2017  . FOOT EXAM  04/17/2017  . TETANUS/TDAP  04/02/2023  . DEXA SCAN  Completed  . PNA vac Low Risk Adult  Completed    Lab Results  Component Value Date   WBC 6.6 09/01/2016   HGB 13.6 09/01/2016   HCT 41.7 09/01/2016   PLT 449.0 (H) 09/01/2016   GLUCOSE 93 09/01/2016   CHOL 159 09/01/2016   TRIG 129.0 09/01/2016   HDL 35.10 (L) 09/01/2016   LDLDIRECT 143.0 07/25/2016   LDLCALC 98 09/01/2016   ALT 14 09/01/2016   AST 18 09/01/2016   NA 140 09/01/2016   K 4.1 09/01/2016   CL 109 09/01/2016   CREATININE 0.80 09/01/2016   BUN 22 09/01/2016   CO2 25 09/01/2016   TSH 3.99 09/01/2016   INR 2.5 09/22/2016   HGBA1C 6.6 (H) 09/01/2016   MICROALBUR 1.5 10/20/2014    Lab Results  Component Value Date   TSH 3.99 09/01/2016   Lab Results  Component Value Date   WBC 6.6 09/01/2016   HGB 13.6 09/01/2016   HCT 41.7 09/01/2016   MCV 91.9 09/01/2016   PLT 449.0 (H) 09/01/2016   Lab Results  Component Value Date   NA 140 09/01/2016   K 4.1 09/01/2016   CHLORIDE 109 08/12/2013   CO2 25 09/01/2016   GLUCOSE 93 09/01/2016   BUN 22 09/01/2016   CREATININE 0.80 09/01/2016   BILITOT 0.8 09/01/2016   ALKPHOS 71 09/01/2016   AST 18 09/01/2016   ALT 14 09/01/2016   PROT 7.4 09/01/2016   ALBUMIN 3.8 09/01/2016   CALCIUM 9.2 09/01/2016   ANIONGAP 6 08/17/2016   GFR 73.22 09/01/2016   Lab Results  Component Value Date   CHOL 159 09/01/2016   Lab Results  Component Value Date   HDL 35.10 (L) 09/01/2016   Lab Results  Component Value Date   LDLCALC 98 09/01/2016   Lab Results  Component Value Date  TRIG 129.0 09/01/2016   Lab Results  Component Value Date   CHOLHDL 5 09/01/2016   Lab Results  Component Value Date   HGBA1C 6.6 (H) 09/01/2016         Assessment & Plan:   Problem List Items Addressed This Visit    Hyperlipidemia    Encouraged heart healthy diet, increase exercise, avoid trans fats, consider a krill oil cap daily        Essential hypertension    Improved control, no changes to meds. Encouraged heart healthy diet such as the DASH diet and exercise as tolerated. Reports home BP numbers in the 120s/60s most days.      Relevant Orders   CT Angio Chest W/Cm &/Or Wo Cm   Aneurysm of thoracic aorta (HCC)    Recent sob and chest pain will proceed with CT of chest. Unable proceed with MRI due to hip replacement. Spoke with radiology will proceed with CT angiogram      Relevant Orders   CT Angio Chest W/Cm &/Or Wo Cm   Diabetes mellitus type 2 in obese (HCC)    hgba1c acceptable, minimize simple carbs. Increase exercise as tolerated. Continue current meds      Relevant Orders   CT Angio Chest W/Cm &/Or Wo Cm   SIRS (systemic inflammatory response syndrome) (HCC)    She is feeling much better today and doing well at home. No new concerns      History of UTI    Asymptomatic today       Other Visit Diagnoses    Needs flu shot    -  Primary   Relevant Orders   Flu vaccine HIGH DOSE PF (Fluzone High dose) (Completed)      I am having [name] Little maintain her B Complex Vitamins (VITAMIN B COMPLEX PO), potassium chloride SA, warfarin, ALPRAZolam, glucose monitoring kit, Lancets, glucose blood, MULTIVITAMIN ADULTS, ONE TOUCH ULTRA 2, furosemide, losartan, and colesevelam.  No orders of the defined types were placed in this encounter.  CMA served as Education administrator during this visit. History, Physical and Plan performed by medical provider. Documentation and orders reviewed and attested to.  Penni Homans, MD

## 2016-10-16 NOTE — Assessment & Plan Note (Signed)
hgba1c acceptable, minimize simple carbs. Increase exercise as tolerated. Continue current meds 

## 2016-10-16 NOTE — Patient Instructions (Signed)
Thoracic Aortic Aneurysm An aneurysm is a bulge in an artery. It happens when blood pushes up against a weakened or damaged artery wall. A thoracic aortic aneurysm is an aneurysm that occurs in the first part of the aorta, between the heart and the diaphragm. The aorta is the main artery of the body. It supplies blood from the heart to the rest of the body. Some aneurysms may not cause symptoms or problems. However, the major concern with a thoracic aortic aneurysm is that it can enlarge and burst (rupture), or blood can flow between the layers of the wall of the aorta through a tear (aorticdissection). Both of these conditions can cause bleeding inside the body and can be life-threatening if they are not diagnosed and treated right away. What are the causes? The exact cause of this condition is not known. What increases the risk? The following factors may make you more likely to develop this condition:  Being age 65 or older.  Having a hardening of the arteries caused by the buildup of fat and other substances in the lining of a blood vessel (arteriosclerosis).  Having inflammation of the walls of an artery (arteritis).  Having a genetic disease that weakens the body's connective tissue, such as Marfan syndrome.  Having an injury or trauma to the aorta.  Having an infection that is caused by bacteria, such as syphilis or staphylococcus, in the wall of the aorta (infectious aortitis).  Having high blood pressure (hypertension).  Being female.  Being white (Caucasian).  Having high cholesterol.  Having a family history of aneurysms.  Using tobacco.  Having chronic obstructive pulmonary disease (COPD).  What are the signs or symptoms? Symptoms of this condition vary depending on the size and rate of growth of the aneurysm. Most grow slowly and do not cause any symptoms. When symptoms do occur, they may include:  Pain in the chest, back, sides, or abdomen. The pain may vary in  intensity. A sudden onset of severe pain may indicate that the aneurysm has ruptured.  Hoarseness.  Cough.  Shortness of breath.  Swallowing problems.  Swelling in the face, arms, or legs.  Fever.  Unexplained weight loss.  How is this diagnosed? This condition may be diagnosed with:  An ultrasound.  X-rays.  A CT scan.  An MRI.  Tests to check the arteries for damage or blockages (angiogram).  Most unruptured thoracic aortic aneurysms cause no symptoms, so they are often found during exams for other conditions. How is this treated? Treatment for this condition depends on:  The size of the aneurysm.  How fast the aneurysm is growing.  Your age.  Risk factors for rupture.  Aneurysms that are smaller than 2.2 inches (5.5 cm) may be managed by using medicines to control blood pressure, manage pain, or fight infection. You may need regular monitoring to see if the aneurysm is getting bigger. Your health care provider may recommend that you have an ultrasound every year or every 6 months. How often you need to have an ultrasound depends on the size of the aneurysm, how fast it is growing, and whether you have a family history of aneurysms. Surgical repair may be needed if your aneurysm is larger than 2.2 inches or if it is growing quickly. Follow these instructions at home: Eating and drinking  Eat a healthy diet. Your health care provider may recommend that you: ? Lower your salt (sodium) intake. In some people, too much salt can raise blood pressure and increase   the risk of thoracic aortic aneurysm. ? Avoid foods that are high in saturated fat and cholesterol, such as red meat and dairy. ? Eat a diet that is low in sugar. ? Increase your fiber intake by including whole grains, vegetables, and fruits in your diet. Eating these foods may help to lower blood pressure.  Limit or avoid alcohol as recommended by your health care provider. Lifestyle  Follow instructions  from your health care provider about healthy lifestyle habits. Your health care provider may recommend that you: ? Do not use any products that contain nicotine or tobacco, such as cigarettes and e-cigarettes. If you need help quitting, ask your health care provider. ? Keep your blood pressure within normal limits. The target limit for most people is below 120/80. Check your blood pressure regularly. If it is high, ask your health care provider about ways that you can control it. ? Keep your blood sugar (glucose) level and cholesterol levels within normal limits. Target limits for most people are:  Blood glucose level: Less than 100 mg/dL.  Total cholesterol level: Less than 200 mg/dL. ? Maintain a healthy weight. Activity  Stay physically active and exercise regularly. Talk with your health care provider about how often you should exercise and ask which types of exercise are safe for you.  Avoid heavy lifting and activities that take a lot of effort (are strenuous). Ask your health care provider what activities are safe for you. General instructions  Keep all follow-up visits as told by your health care provider. This is important. ? Talk with your health care provider about regular screenings to see if the aneurysm is getting bigger.  Take over-the-counter and prescription medicines only as told by your health care provider. Contact a health care provider if:  You have discomfort in your upper back, neck, or abdomen.  You have trouble swallowing.  You have a cough or hoarseness.  You have a family history of aneurysms.  You have unexplained weight loss. Get help right away if:  You have sudden, severe pain in your upper back and abdomen. This pain may move into your chest and arms.  You have shortness of breath.  You have a fever. This information is not intended to replace advice given to you by your health care provider. Make sure you discuss any questions you have with your  health care provider. Document Released: 01/16/2005 Document Revised: 10/29/2015 Document Reviewed: 10/29/2015 Elsevier Interactive Patient Education  2018 Elsevier Inc.  

## 2016-10-16 NOTE — Assessment & Plan Note (Signed)
She is feeling much better today and doing well at home. No new concerns

## 2016-10-16 NOTE — Assessment & Plan Note (Signed)
Recent sob and chest pain will proceed with CT of chest. Unable proceed with MRI due to hip replacement. Spoke with radiology will proceed with CT angiogram

## 2016-10-16 NOTE — Assessment & Plan Note (Signed)
Encouraged heart healthy diet, increase exercise, avoid trans fats, consider a krill oil cap daily 

## 2016-10-18 ENCOUNTER — Ambulatory Visit: Payer: Medicare Other

## 2016-10-20 ENCOUNTER — Ambulatory Visit (INDEPENDENT_AMBULATORY_CARE_PROVIDER_SITE_OTHER): Payer: Medicare Other | Admitting: General Practice

## 2016-10-20 DIAGNOSIS — Z7901 Long term (current) use of anticoagulants: Secondary | ICD-10-CM

## 2016-10-20 DIAGNOSIS — Z86718 Personal history of other venous thrombosis and embolism: Secondary | ICD-10-CM

## 2016-10-20 DIAGNOSIS — Z5181 Encounter for therapeutic drug level monitoring: Secondary | ICD-10-CM

## 2016-10-20 LAB — POCT INR: INR: 1.8

## 2016-10-20 NOTE — Progress Notes (Signed)
I have reviewed and agree with the plan. 

## 2016-10-20 NOTE — Patient Instructions (Signed)
Pre visit review using our clinic review tool, if applicable. No additional management support is needed unless otherwise documented below in the visit note. 

## 2016-10-31 ENCOUNTER — Telehealth: Payer: Self-pay | Admitting: Family Medicine

## 2016-10-31 DIAGNOSIS — I10 Essential (primary) hypertension: Secondary | ICD-10-CM

## 2016-10-31 DIAGNOSIS — I712 Thoracic aortic aneurysm, without rupture, unspecified: Secondary | ICD-10-CM

## 2016-10-31 NOTE — Telephone Encounter (Signed)
Please order BMP to check her renal function prior to CT scan

## 2016-10-31 NOTE — Telephone Encounter (Signed)
Please advise    PC 

## 2016-10-31 NOTE — Telephone Encounter (Signed)
Imaging calling, patient will need lab work for CT Chest

## 2016-11-01 NOTE — Telephone Encounter (Signed)
Created future order for BMP with I71.2 & I10 to have completed prior to CT scan/thx dmf

## 2016-11-15 ENCOUNTER — Ambulatory Visit: Payer: Medicare Other

## 2016-11-22 ENCOUNTER — Ambulatory Visit (INDEPENDENT_AMBULATORY_CARE_PROVIDER_SITE_OTHER): Payer: Medicare Other | Admitting: General Practice

## 2016-11-22 DIAGNOSIS — Z7901 Long term (current) use of anticoagulants: Secondary | ICD-10-CM | POA: Diagnosis not present

## 2016-11-22 LAB — POCT INR: INR: 2

## 2016-11-22 NOTE — Patient Instructions (Signed)
Pre visit review using our clinic review tool, if applicable. No additional management support is needed unless otherwise documented below in the visit note. 

## 2016-11-27 ENCOUNTER — Other Ambulatory Visit (INDEPENDENT_AMBULATORY_CARE_PROVIDER_SITE_OTHER): Payer: Medicare Other

## 2016-11-27 DIAGNOSIS — I10 Essential (primary) hypertension: Secondary | ICD-10-CM | POA: Diagnosis not present

## 2016-11-27 DIAGNOSIS — I712 Thoracic aortic aneurysm, without rupture, unspecified: Secondary | ICD-10-CM

## 2016-11-27 LAB — BASIC METABOLIC PANEL
BUN: 20 mg/dL (ref 6–23)
CO2: 27 mEq/L (ref 19–32)
Calcium: 9.3 mg/dL (ref 8.4–10.5)
Chloride: 106 mEq/L (ref 96–112)
Creatinine, Ser: 0.73 mg/dL (ref 0.40–1.20)
GFR: 81.33 mL/min (ref >60.00)
Glucose, Bld: 105 mg/dL — ABNORMAL HIGH (ref 70–99)
Potassium: 4.5 mEq/L (ref 3.5–5.1)
Sodium: 140 mEq/L (ref 135–145)

## 2016-11-29 ENCOUNTER — Encounter (HOSPITAL_BASED_OUTPATIENT_CLINIC_OR_DEPARTMENT_OTHER): Payer: Self-pay

## 2016-11-29 ENCOUNTER — Ambulatory Visit (HOSPITAL_BASED_OUTPATIENT_CLINIC_OR_DEPARTMENT_OTHER)
Admission: RE | Admit: 2016-11-29 | Discharge: 2016-11-29 | Disposition: A | Discharge status: Home / Self Care | Payer: Medicare Other | Source: Ambulatory Visit | Attending: Family Medicine | Admitting: Family Medicine

## 2016-11-29 DIAGNOSIS — I700 Atherosclerosis of aorta: Secondary | ICD-10-CM | POA: Insufficient documentation

## 2016-11-29 DIAGNOSIS — I712 Thoracic aortic aneurysm, without rupture, unspecified: Secondary | ICD-10-CM

## 2016-11-29 DIAGNOSIS — E1169 Type 2 diabetes mellitus with other specified complication: Secondary | ICD-10-CM | POA: Insufficient documentation

## 2016-11-29 DIAGNOSIS — E669 Obesity, unspecified: Secondary | ICD-10-CM | POA: Insufficient documentation

## 2016-11-29 DIAGNOSIS — I10 Essential (primary) hypertension: Secondary | ICD-10-CM | POA: Diagnosis present

## 2016-11-29 MED ORDER — IOPAMIDOL (ISOVUE-370) INJECTION 76%
100.0000 mL | Freq: Once | INTRAVENOUS | Status: AC | PRN
  Administered 2016-11-29: 100 mL via INTRAVENOUS

## 2016-12-04 ENCOUNTER — Encounter: Payer: Self-pay | Admitting: Family Medicine

## 2016-12-04 ENCOUNTER — Ambulatory Visit: Payer: Medicare Other | Admitting: Family Medicine

## 2016-12-04 DIAGNOSIS — E782 Mixed hyperlipidemia: Secondary | ICD-10-CM

## 2016-12-04 DIAGNOSIS — I10 Essential (primary) hypertension: Secondary | ICD-10-CM

## 2016-12-04 DIAGNOSIS — Z0000 Encounter for general adult medical examination without abnormal findings: Secondary | ICD-10-CM

## 2016-12-04 DIAGNOSIS — E669 Obesity, unspecified: Secondary | ICD-10-CM | POA: Diagnosis not present

## 2016-12-04 DIAGNOSIS — E1169 Type 2 diabetes mellitus with other specified complication: Secondary | ICD-10-CM | POA: Diagnosis not present

## 2016-12-04 MED ORDER — COLESEVELAM HCL 625 MG PO TABS: 625.0000 mg | ORAL_TABLET | Freq: Two times a day (BID) | ORAL | 0 refills | Status: DC

## 2016-12-04 NOTE — Progress Notes (Signed)
Subjective:  I acted as a Education administrator for Dr. Charlett Blake. [place], [place]   Patient ID: [name] Little, female    DOB: 1933/08/18, 83 y.o.   MRN: [id number]  No chief complaint on file.   HPI  Patient is in today for a follow up and she is feeling well. No recent febrile illness or recent hospitalizations. She is eating well and sleeping well. No polyuria or polydipsia. Denies CP/palp/SOB/HA/congestion/fevers/GI or GU c/o. Taking meds as prescribed  Patient Care Team: Mosie Lukes, MD as PCP - General (Family Medicine) Zadie Rhine Clent Demark, MD as Consulting Physician (Ophthalmology)   Past Medical History:  Diagnosis Date  . Anxiety state, unspecified 09/22/2013  . Arthritis    lumbar stenosis, radiculopathy, OA- L hip  . BCC (basal cell carcinoma), scalp/neck 09/27/2015  . Blood clotting disorder (HCC)    V5 factor  . Blood dyscrasia    factor 5 deficiency  . Breast cancer ([place]) 2010   RIGHT breast  . Clostridium difficile infection   . Clotting disorder ([place])   . Diabetes mellitus type 2 in obese ([place]) 02/26/2014  . DVT (deep venous thrombosis) ([place])   . Factor V deficiency ([place])   . Hemorrhoids   . Hyperglycemia 02/26/2014  . Hyperlipemia   . Hypertension   . Infiltrating ductal carcinoma of breast ([place])    stage one right   . Low back pain 03/26/2014  . Macular degeneration   . Mitral valve regurgitation 09/01/2016  . Mixed hyperlipidemia 05/18/2015  . Nocturia 02/26/2014  . Paroxysmal supraventricular tachycardia ([place])   . Personal history of colonic polyps    hyperplastic  . Thoracic aortic aneurysm ([place])   . TMJ pain dysfunction syndrome     Past Surgical History:  Procedure Laterality Date  . ABDOMINAL HYSTERECTOMY     1960- ? vaginal hysterectomy, not laparoscopic   . BREAST LUMPECTOMY  2010   right Dr. [name]  . COLONOSCOPY    . EYE SURGERY     bilateral cataracts- /w IOL   . JOINT REPLACEMENT     R hip replacement   . LAPAROSCOPIC HYSTERECTOMY    . right calf  surgery     for cancer  . SPLENECTOMY    . TOTAL HIP ARTHROPLASTY  1999   right  . UPPER GASTROINTESTINAL ENDOSCOPY      Family History  Problem Relation Age of Onset  . Heart disease Mother   . Heart disease Father   . Alzheimer's disease Sister   . Heart disease Unknown        uncles  . Clotting disorder Maternal Uncle   . Breast cancer Paternal Aunt   . Cancer Paternal Aunt        breast  . Colon cancer Neg Hx   . Anesthesia problems Neg Hx   . Hypotension Neg Hx   . Malignant hyperthermia Neg Hx   . Pseudochol deficiency Neg Hx     Social History   Socioeconomic History  . Marital status: Married    Spouse name: Not on file  . Number of children: 4  . Years of education: Not on file  . Highest education level: Not on file  Social Needs  . Financial resource strain: Not on file  . Food insecurity - worry: Not on file  . Food insecurity - inability: Not on file  . Transportation needs - medical: Not on file  . Transportation needs - non-medical: Not on file  Occupational History  .  Occupation: Retired  Tobacco Use  . Smoking status: Never Smoker  . Smokeless tobacco: Never Used  Substance and Sexual Activity  . Alcohol use: No    Alcohol/week: 0.0 oz  . Drug use: No  . Sexual activity: No    Partners: Male  Other Topics Concern  . Not on file  Social History Narrative   Married 1955   2 sons - '59, '61, 2 daughters '55, '57; 8 grandchildren; 1 great grand   I-ADLs       Outpatient Medications Prior to Visit  Medication Sig Dispense Refill  . ALPRAZolam (XANAX) 0.25 MG tablet TAKE ONE TABLET BY MOUTH TWICE DIALY AS NEEDED FOR ANXIETY 20 tablet 1  . B Complex Vitamins (VITAMIN B COMPLEX PO) Take 1 tablet by mouth daily.     . Blood Glucose Monitoring Suppl (ONE TOUCH ULTRA 2) w/Device KIT See admin instructions.  0  . furosemide (LASIX) 20 MG tablet Take 1 tablet (20 mg total) by mouth daily as needed for fluid or edema. 30 tablet 1  . glucose blood  (IGLUCOSE TEST STRIPS) test strip Test Blood Sugars every day as needed 100 each 5  . glucose monitoring kit (FREESTYLE) monitoring kit 1 each by Does not apply route as needed for other. 1 each 0  . Lancets MISC Test Blood Sugars every day as needed 100 each 5  . losartan (COZAAR) 100 MG tablet TAKE 1 TABLET (100 MG TOTAL) BY MOUTH DAILY. 90 tablet 1  . Multiple Vitamins-Minerals (MULTIVITAMIN ADULTS) TABS Take 1 tablet by mouth daily.    . potassium chloride SA (K-DUR,KLOR-CON) 20 MEQ tablet Take 1 tablet (20 mEq total) by mouth daily as needed (lasix use). 30 tablet 0  . warfarin (COUMADIN) 5 MG tablet TAKE AS DIRECTED BY COUMADIN CLINIC. (Patient taking differently: Take 1 tablet (5 mg) by mouth daily except on Mon Take 0.5 tablet (2.5 mg)) 90 tablet 3  . colesevelam (WELCHOL) 625 MG tablet TAKE 1 TABLET BY MOUTH TWICE A DAY WITH MEALS 60 tablet 0   No facility-administered medications prior to visit.     Allergies  Allergen Reactions  . Amlodipine Swelling    "my whole body swelled"  . Carvedilol Swelling    "my whole body swelled"  . Cefuroxime Axetil     REACTION: Ddoes not remember reaction  . Chlorthalidone     Patient reported extreme weakness.  . Statins     Weakness, pain Lipitor and Pravachol  . Sulfonamide Derivatives     REACTION: Rash    Review of Systems  Constitutional: Negative for chills, fever and malaise/fatigue.  HENT: Negative for congestion and hearing loss.   Eyes: Negative for discharge.  Respiratory: Negative for cough, sputum production and shortness of breath.   Cardiovascular: Negative for chest pain, palpitations and leg swelling.  Gastrointestinal: Negative for abdominal pain, blood in stool, constipation, diarrhea, heartburn, nausea and vomiting.  Genitourinary: Negative for dysuria, frequency, hematuria and urgency.  Musculoskeletal: Negative for back pain, falls and myalgias.  Skin: Negative for rash.  Neurological: Negative for dizziness,  sensory change, loss of consciousness, weakness and headaches.  Endo/Heme/Allergies: Negative for environmental allergies. Does not bruise/bleed easily.  Psychiatric/Behavioral: Negative for depression and suicidal ideas. The patient is not nervous/anxious and does not have insomnia.        Objective:    Physical Exam  Constitutional: She is oriented to person, place, and time. She appears well-developed and well-nourished. No distress.  HENT:  Head: Normocephalic  and atraumatic.  Eyes: Conjunctivae are normal.  Neck: Neck supple. No thyromegaly present.  Cardiovascular: Normal rate, regular rhythm and normal heart sounds.  No murmur heard. Pulmonary/Chest: Effort normal and breath sounds normal. No respiratory distress.  Abdominal: Soft. Bowel sounds are normal. She exhibits no distension and no mass. There is no tenderness.  Musculoskeletal: She exhibits no edema.  Lymphadenopathy:    She has no cervical adenopathy.  Neurological: She is alert and oriented to person, place, and time.  Skin: Skin is warm and dry.  Psychiatric: She has a normal mood and affect. Her behavior is normal.    BP 124/78 (BP Location: Left Arm, Patient Position: Sitting, Cuff Size: Normal)   Pulse 62   Temp 97.7 F (36.5 C) (Oral)   Resp 18   Wt 168 lb (76.2 kg)   SpO2 96%   BMI 29.76 kg/m  Wt Readings from Last 3 Encounters:  12/04/16 168 lb (76.2 kg)  10/16/16 162 lb 6.4 oz (73.7 kg)  09/06/16 161 lb 6 oz (73.2 kg)   BP Readings from Last 3 Encounters:  12/04/16 124/78  10/16/16 (!) 146/88  09/06/16 128/80     Immunization History  Administered Date(s) Administered  . Influenza Split 11/14/2010  . Influenza Whole 11/08/2005, 10/28/2007, 11/23/2008, 11/26/2009  . Influenza, High Dose Seasonal PF 10/19/2015, 10/16/2016  . Influenza,inj,Quad PF,6+ Mos 11/26/2012, 09/22/2013, 10/20/2014  . Pneumococcal Conjugate-13 04/01/2013  . Pneumococcal Polysaccharide-23 10/30/2001, 07/27/2009  . Td  11/27/2001  . Tetanus 04/01/2013    Health Maintenance  Topic Date Due  . OPHTHALMOLOGY EXAM  08/10/2015  . HEMOGLOBIN A1C  03/04/2017  . FOOT EXAM  04/17/2017  . TETANUS/TDAP  04/02/2023  . INFLUENZA VACCINE  Completed  . DEXA SCAN  Completed  . PNA vac Low Risk Adult  Completed    Lab Results  Component Value Date   WBC 6.6 09/01/2016   HGB 13.6 09/01/2016   HCT 41.7 09/01/2016   PLT 449.0 (H) 09/01/2016   GLUCOSE 105 (H) 11/27/2016   CHOL 159 09/01/2016   TRIG 129.0 09/01/2016   HDL 35.10 (L) 09/01/2016   LDLDIRECT 143.0 07/25/2016   LDLCALC 98 09/01/2016   ALT 14 09/01/2016   AST 18 09/01/2016   NA 140 11/27/2016   K 4.5 11/27/2016   CL 106 11/27/2016   CREATININE 0.73 11/27/2016   BUN 20 11/27/2016   CO2 27 11/27/2016   TSH 3.99 09/01/2016   INR 2.0 11/22/2016   HGBA1C 6.6 (H) 09/01/2016   MICROALBUR 1.5 10/20/2014    Lab Results  Component Value Date   TSH 3.99 09/01/2016   Lab Results  Component Value Date   WBC 6.6 09/01/2016   HGB 13.6 09/01/2016   HCT 41.7 09/01/2016   MCV 91.9 09/01/2016   PLT 449.0 (H) 09/01/2016   Lab Results  Component Value Date   NA 140 11/27/2016   K 4.5 11/27/2016   CHLORIDE 109 08/12/2013   CO2 27 11/27/2016   GLUCOSE 105 (H) 11/27/2016   BUN 20 11/27/2016   CREATININE 0.73 11/27/2016   BILITOT 0.8 09/01/2016   ALKPHOS 71 09/01/2016   AST 18 09/01/2016   ALT 14 09/01/2016   PROT 7.4 09/01/2016   ALBUMIN 3.8 09/01/2016   CALCIUM 9.3 11/27/2016   ANIONGAP 6 08/17/2016   GFR 81.33 11/27/2016   Lab Results  Component Value Date   CHOL 159 09/01/2016   Lab Results  Component Value Date   HDL 35.10 (L) 09/01/2016  Lab Results  Component Value Date   LDLCALC 98 09/01/2016   Lab Results  Component Value Date   TRIG 129.0 09/01/2016   Lab Results  Component Value Date   CHOLHDL 5 09/01/2016   Lab Results  Component Value Date   HGBA1C 6.6 (H) 09/01/2016         Assessment & Plan:   Problem  List Items Addressed This Visit    Hyperlipidemia    Encouraged heart healthy diet, increase exercise, avoid trans fats, consider a krill oil cap daily      Relevant Orders   Lipid panel   TSH   Essential hypertension    Well controlled, no changes to meds. Encouraged heart healthy diet such as the DASH diet and exercise as tolerated.       Relevant Orders   CBC   Comprehensive metabolic panel   Preventative health care    Patient encouraged to maintain heart healthy diet, regular exercise, adequate sleep. Consider daily probiotics. Take medications as prescribed. Labs reviewed       Obesity (BMI 30-39.9)    Encouraged DASH diet, decrease po intake and increase exercise as tolerated. Needs 7-8 hours of sleep nightly. Avoid trans fats, eat small, frequent meals every 4-5 hours with lean proteins, complex carbs and healthy fats. Minimize simple carbs      Diabetes mellitus type 2 in obese (HCC)    hgba1c acceptable, minimize simple carbs. Increase exercise as tolerated. Continue current meds      Relevant Orders   Hemoglobin A1c      I have discontinued [name] Little. [name] Little colesevelam and colesevelam. I am also having her maintain her B Complex Vitamins (VITAMIN B COMPLEX PO), potassium chloride SA, warfarin, ALPRAZolam, glucose monitoring kit, Lancets, glucose blood, MULTIVITAMIN ADULTS, ONE TOUCH ULTRA 2, furosemide, and losartan.  Meds ordered this encounter  Medications  . DISCONTD: colesevelam (WELCHOL) 625 MG tablet    Sig: Take 1 tablet (625 mg total) 2 (two) times daily with a meal by mouth.    Dispense:  60 tablet    Refill:  0    ONLY HAS ONE TABLET LEFT--PLEASE ADVISE IF SHE IS TO CONTINUE MED    CMA served as scribe during this visit. History, Physical and Plan performed by medical provider. Documentation and orders reviewed and attested to.  Penni Homans, MD

## 2016-12-04 NOTE — Assessment & Plan Note (Deleted)
Patient encouraged to maintain heart healthy diet, regular exercise, adequate sleep. Consider daily probiotics. Take medications as prescribed. Labs reviewed 

## 2016-12-04 NOTE — Assessment & Plan Note (Signed)
hgba1c acceptable, minimize simple carbs. Increase exercise as tolerated. Continue current meds 

## 2016-12-04 NOTE — Assessment & Plan Note (Signed)
Well controlled, no changes to meds. Encouraged heart healthy diet such as the DASH diet and exercise as tolerated.  °

## 2016-12-04 NOTE — Assessment & Plan Note (Signed)
Encouraged DASH diet, decrease po intake and increase exercise as tolerated. Needs 7-8 hours of sleep nightly. Avoid trans fats, eat small, frequent meals every 4-5 hours with lean proteins, complex carbs and healthy fats. Minimize simple carbs 

## 2016-12-04 NOTE — Patient Instructions (Signed)

## 2016-12-04 NOTE — Assessment & Plan Note (Signed)
Encouraged heart healthy diet, increase exercise, avoid trans fats, consider a krill oil cap daily 

## 2016-12-12 MED FILL — LOSARTAN POTASSIUM 100 MG T: 100 | 90 days supply | Qty: 90 | Fill #0

## 2016-12-26 ENCOUNTER — Ambulatory Visit: Payer: Medicare Other | Admitting: General Practice

## 2016-12-26 DIAGNOSIS — Z86718 Personal history of other venous thrombosis and embolism: Secondary | ICD-10-CM

## 2016-12-26 DIAGNOSIS — Z7901 Long term (current) use of anticoagulants: Secondary | ICD-10-CM

## 2016-12-26 LAB — POCT INR: INR: 2.8

## 2016-12-26 NOTE — Patient Instructions (Addendum)
Pre visit review using our clinic review tool, if applicable. No additional management support is needed unless otherwise documented below in the visit note.  Continue to take 1 tablet daily with 1/2 tablet on Mondays.  Re-check in 4 weeks.

## 2016-12-27 ENCOUNTER — Ambulatory Visit: Payer: Medicare Other

## 2017-01-04 ENCOUNTER — Telehealth: Payer: Self-pay | Admitting: Family Medicine

## 2017-01-04 DIAGNOSIS — R300 Dysuria: Secondary | ICD-10-CM

## 2017-01-04 NOTE — Telephone Encounter (Signed)
Copied from [place] [id number]. Topic: Quick Communication - See Telephone Encounter >> Jan 04, 2017  8:08 AM Oneta Rack wrote: CRM for notification. See Telephone encounter for:   01/04/17.  Relation to pt: self Call back number: [phone]    Reason for call:  Patient declined appointment with PCP requesting urine orders only due to frequent urination, burning while urinating / odor , please advise

## 2017-01-04 NOTE — Telephone Encounter (Signed)
Please advise 

## 2017-01-04 NOTE — Telephone Encounter (Signed)
Please place order for UA with c and s for dysuria. Please order and let patient know

## 2017-01-05 ENCOUNTER — Other Ambulatory Visit (INDEPENDENT_AMBULATORY_CARE_PROVIDER_SITE_OTHER): Payer: Medicare Other

## 2017-01-05 DIAGNOSIS — R300 Dysuria: Secondary | ICD-10-CM | POA: Diagnosis not present

## 2017-01-05 LAB — URINALYSIS
Bilirubin Urine: NEGATIVE
Hgb urine dipstick: NEGATIVE
Ketones, ur: NEGATIVE
Leukocytes, UA: NEGATIVE
Nitrite: NEGATIVE
Specific Gravity, Urine: 1.02 (ref 1.000–1.030)
Total Protein, Urine: NEGATIVE
Urine Glucose: NEGATIVE
Urobilinogen, UA: 0.2 (ref 0.0–1.0)
pH: 6.5 (ref 5.0–8.0)

## 2017-01-05 NOTE — Telephone Encounter (Signed)
Patient notified. Orders placed. °

## 2017-01-07 LAB — URINE CULTURE
MICRO NUMBER:: 81379365
SPECIMEN QUALITY:: ADEQUATE

## 2017-01-10 MED ORDER — CIPROFLOXACIN HCL 250 MG PO TABS: 250.0000 mg | ORAL_TABLET | Freq: Two times a day (BID) | ORAL | 0 refills | Status: DC

## 2017-01-10 NOTE — Addendum Note (Signed)
Addended by: Magdalene Molly A on: 01/10/2017 12:36 PM   Modules accepted: Orders

## 2017-01-26 ENCOUNTER — Ambulatory Visit: Payer: Medicare Other | Admitting: General Practice

## 2017-01-26 DIAGNOSIS — Z86718 Personal history of other venous thrombosis and embolism: Secondary | ICD-10-CM

## 2017-01-26 DIAGNOSIS — Z7901 Long term (current) use of anticoagulants: Secondary | ICD-10-CM

## 2017-01-26 LAB — POCT INR: INR: 3.4

## 2017-01-26 NOTE — Patient Instructions (Addendum)
Pre visit review using our clinic review tool, if applicable. No additional management support is needed unless otherwise documented below in the visit note.  Skip dose today (12/28) and then continue to take 1 tablet daily with 1/2 tablet on Mondays.  Re-check in 4 weeks.

## 2017-02-23 ENCOUNTER — Ambulatory Visit: Payer: Medicare Other | Admitting: General Practice

## 2017-02-23 DIAGNOSIS — Z7901 Long term (current) use of anticoagulants: Secondary | ICD-10-CM

## 2017-02-23 LAB — POCT INR: INR: 2.9

## 2017-02-23 NOTE — Patient Instructions (Signed)
Pre visit review using our clinic review tool, if applicable. No additional management support is needed unless otherwise documented below in the visit note.  Continue to take 1 tablet daily with 1/2 tablet on Mondays.  Re-check in 4 weeks.

## 2017-03-05 ENCOUNTER — Other Ambulatory Visit (INDEPENDENT_AMBULATORY_CARE_PROVIDER_SITE_OTHER): Payer: Medicare Other

## 2017-03-05 DIAGNOSIS — I10 Essential (primary) hypertension: Secondary | ICD-10-CM | POA: Diagnosis not present

## 2017-03-05 DIAGNOSIS — E782 Mixed hyperlipidemia: Secondary | ICD-10-CM | POA: Diagnosis not present

## 2017-03-05 DIAGNOSIS — E1169 Type 2 diabetes mellitus with other specified complication: Secondary | ICD-10-CM

## 2017-03-05 DIAGNOSIS — E669 Obesity, unspecified: Secondary | ICD-10-CM | POA: Diagnosis not present

## 2017-03-05 LAB — CBC
HCT: 42.2 % (ref 36.0–46.0)
Hemoglobin: 13.9 g/dL (ref 12.0–15.0)
MCHC: 33 g/dL (ref 30.0–36.0)
MCV: 91.6 fl (ref 78.0–100.0)
Platelets: 350 10*3/uL (ref 150.0–400.0)
RBC: 4.6 Mil/uL (ref 3.87–5.11)
RDW: 14.7 % (ref 11.5–15.5)
WBC: 8.7 10*3/uL (ref 4.0–10.5)

## 2017-03-05 LAB — COMPREHENSIVE METABOLIC PANEL
ALT: 10 U/L (ref 0–35)
AST: 14 U/L (ref 0–37)
Albumin: 3.7 g/dL (ref 3.5–5.2)
Alkaline Phosphatase: 62 U/L (ref 39–117)
BUN: 27 mg/dL — ABNORMAL HIGH (ref 6–23)
CO2: 28 mEq/L (ref 19–32)
Calcium: 9 mg/dL (ref 8.4–10.5)
Chloride: 107 mEq/L (ref 96–112)
Creatinine, Ser: 0.81 mg/dL (ref 0.40–1.20)
GFR: 72.09 mL/min (ref >60.00)
Glucose, Bld: 125 mg/dL — ABNORMAL HIGH (ref 70–99)
Potassium: 4.1 mEq/L (ref 3.5–5.1)
Sodium: 141 mEq/L (ref 135–145)
Total Bilirubin: 0.4 mg/dL (ref 0.2–1.2)
Total Protein: 7.4 g/dL (ref 6.0–8.3)

## 2017-03-05 LAB — LIPID PANEL
Cholesterol: 213 mg/dL — ABNORMAL HIGH (ref 0–200)
HDL: 40.3 mg/dL (ref >39.00)
LDL Cholesterol: 134 mg/dL — ABNORMAL HIGH (ref 0–99)
NonHDL: 172.95
Total CHOL/HDL Ratio: 5
Triglycerides: 197 mg/dL — ABNORMAL HIGH (ref 0.0–149.0)
VLDL: 39.4 mg/dL (ref 0.0–40.0)

## 2017-03-05 LAB — HEMOGLOBIN A1C: Hgb A1c MFr Bld: 6.5 % (ref 4.6–6.5)

## 2017-03-05 LAB — TSH: TSH: 3.28 u[IU]/mL (ref 0.35–4.50)

## 2017-03-12 ENCOUNTER — Ambulatory Visit: Payer: Medicare Other | Admitting: Family Medicine

## 2017-03-12 ENCOUNTER — Encounter: Payer: Self-pay | Admitting: Family Medicine

## 2017-03-12 DIAGNOSIS — E669 Obesity, unspecified: Secondary | ICD-10-CM

## 2017-03-12 DIAGNOSIS — E782 Mixed hyperlipidemia: Secondary | ICD-10-CM | POA: Diagnosis not present

## 2017-03-12 DIAGNOSIS — I10 Essential (primary) hypertension: Secondary | ICD-10-CM

## 2017-03-12 DIAGNOSIS — E1169 Type 2 diabetes mellitus with other specified complication: Secondary | ICD-10-CM

## 2017-03-12 DIAGNOSIS — F411 Generalized anxiety disorder: Secondary | ICD-10-CM

## 2017-03-12 MED ORDER — LOSARTAN POTASSIUM 100 MG PO TABS: 100.0000 mg | ORAL_TABLET | Freq: Every day | ORAL | 1 refills | Status: DC

## 2017-03-12 NOTE — Patient Instructions (Addendum)
Shingrix is the new shingles, 2 shots over 2-6 months at pharmacy    Cholesterol Cholesterol is a white, waxy, fat-like substance that is needed by the human body in small amounts. The liver makes all the cholesterol we need. Cholesterol is carried from the liver by the blood through the blood vessels. Deposits of cholesterol (plaques) may build up on blood vessel (artery) walls. Plaques make the arteries narrower and stiffer. Cholesterol plaques increase the risk for heart attack and stroke. You cannot feel your cholesterol level even if it is very high. The only way to know that it is high is to have a blood test. Once you know your cholesterol levels, you should keep a record of the test results. Work with your health care provider to keep your levels in the desired range. What do the results mean?  Total cholesterol is a rough measure of all the cholesterol in your blood.  LDL (low-density lipoprotein) is the "bad" cholesterol. This is the type that causes plaque to build up on the artery walls. You want this level to be low.  HDL (high-density lipoprotein) is the "good" cholesterol because it cleans the arteries and carries the LDL away. You want this level to be high.  Triglycerides are fat that the body can either burn for energy or store. High levels are closely linked to heart disease. What are the desired levels of cholesterol?  Total cholesterol below 200.  LDL below 100 for people who are at risk, below 70 for people at very high risk.  HDL above 40 is good. A level of 60 or higher is considered to be protective against heart disease.  Triglycerides below 150. How can I lower my cholesterol? Diet Follow your diet program as told by your health care provider.  Choose fish or white meat chicken and [place], roasted or baked. Limit fatty cuts of red meat, fried foods, and processed meats, such as sausage and lunch meats.  Eat lots of fresh fruits and vegetables.  Choose whole  grains, beans, pasta, potatoes, and cereals.  Choose olive oil, corn oil, or canola oil, and use only small amounts.  Avoid butter, mayonnaise, shortening, or palm kernel oils.  Avoid foods with trans fats.  Drink skim or nonfat milk and eat low-fat or nonfat yogurt and cheeses. Avoid whole milk, cream, ice cream, egg yolks, and full-fat cheeses.  Healthier desserts include angel food cake, ginger snaps, animal crackers, hard candy, popsicles, and low-fat or nonfat frozen yogurt. Avoid pastries, cakes, pies, and cookies.  Exercise  Follow your exercise program as told by your health care provider. A regular program: ? Helps to decrease LDL and raise HDL. ? Helps with weight control.  Do things that increase your activity level, such as gardening, walking, and taking the stairs.  Ask your health care provider about ways that you can be more active in your daily life.  Medicine  Take over-the-counter and prescription medicines only as told by your health care provider. ? Medicine may be prescribed by your health care provider to help lower cholesterol and decrease the risk for heart disease. This is usually done if diet and exercise have failed to bring down cholesterol levels. ? If you have several risk factors, you may need medicine even if your levels are normal.  This information is not intended to replace advice given to you by your health care provider. Make sure you discuss any questions you have with your health care provider. Document Released: 10/11/2000 Document  Revised: 08/14/2015 Document Reviewed: 07/17/2015 Elsevier Interactive Patient Education  [employer].

## 2017-03-12 NOTE — Assessment & Plan Note (Signed)
Well controlled, no changes to meds. Encouraged heart healthy diet such as the DASH diet and exercise as tolerated.  °

## 2017-03-12 NOTE — Assessment & Plan Note (Signed)
Encouraged DASH diet, decrease po intake and increase exercise as tolerated. Needs 7-8 hours of sleep nightly. Avoid trans fats, eat small, frequent meals every 4-5 hours with lean proteins, complex carbs and healthy fats. Minimize simple carbs, bariatric referral 

## 2017-03-12 NOTE — Assessment & Plan Note (Signed)
Has been struggling with palpitations during stressful events and yesterday she went to see her sister with dementia and she had an episode that lasted about a minute and a 1/2 but then resolved. Can use Xanax sparingly but has not needed for nearly 4 months

## 2017-03-12 NOTE — Assessment & Plan Note (Signed)
Does not tolerate statins. Encouraged heart healthy diet, increase exercise, avoid trans fats, consider a krill oil cap daily

## 2017-03-12 NOTE — Progress Notes (Signed)
Subjective:  I acted as a Education administrator for [employer]. Yancey Flemings, [place]   Patient ID: [name] Little, female    DOB: 1933-05-05, 84 y.o.   MRN: [id number]  Chief Complaint  Patient presents with  . Follow-up    HPI  Patient is in today for follow up visit and she feels well today. No recent febrile illness or hospitalizations. No polyuria or polydipsia. Her sugar this morning was 97. She admits she has not been moving much this winter and is frustrated with her weight gain. Denies CP/palp/SOB/HA/congestion/fevers/GI or GU c/o. Taking meds as prescribed  Patient Care Team: Mosie Lukes, MD as PCP - General (Family Medicine) Zadie Rhine Clent Demark, MD as Consulting Physician (Ophthalmology)   Past Medical History:  Diagnosis Date  . Anxiety state, unspecified 09/22/2013  . Arthritis    lumbar stenosis, radiculopathy, OA- L hip  . BCC (basal cell carcinoma), scalp/neck 09/27/2015  . Blood clotting disorder (HCC)    V5 factor  . Blood dyscrasia    factor 5 deficiency  . Breast cancer ([place]) 2010   RIGHT breast  . Clostridium difficile infection   . Clotting disorder ([place])   . Diabetes mellitus type 2 in obese ([place]) 02/26/2014  . DVT (deep venous thrombosis) ([place])   . Factor V deficiency ([place])   . Hemorrhoids   . Hyperglycemia 02/26/2014  . Hyperlipemia   . Hypertension   . Infiltrating ductal carcinoma of breast ([place])    stage one right   . Low back pain 03/26/2014  . Macular degeneration   . Mitral valve regurgitation 09/01/2016  . Mixed hyperlipidemia 05/18/2015  . Nocturia 02/26/2014  . Paroxysmal supraventricular tachycardia ([place])   . Personal history of colonic polyps    hyperplastic  . Thoracic aortic aneurysm ([place])   . TMJ pain dysfunction syndrome     Past Surgical History:  Procedure Laterality Date  . ABDOMINAL HYSTERECTOMY     1960- ? vaginal hysterectomy, not laparoscopic   . BREAST LUMPECTOMY  2010   right Dr. [name]  . COLONOSCOPY    . EYE SURGERY     bilateral  cataracts- /w IOL   . JOINT REPLACEMENT     R hip replacement   . LAPAROSCOPIC HYSTERECTOMY    . LUMBAR LAMINECTOMY/DECOMPRESSION MICRODISCECTOMY  04/11/2011   Procedure: LUMBAR LAMINECTOMY/DECOMPRESSION MICRODISCECTOMY;  Surgeon: [name], MD;  Location: [facility];  Service: Neurosurgery;  Laterality: N/A;  Lumbar Five-Sacral One Microdiscectomy  . right calf surgery     for cancer  . SPLENECTOMY    . TOTAL HIP ARTHROPLASTY  1999   right  . UPPER GASTROINTESTINAL ENDOSCOPY      Family History  Problem Relation Age of Onset  . Heart disease Mother   . Heart disease Father   . Alzheimer's disease Sister   . Heart disease Unknown        uncles  . Clotting disorder Maternal Uncle   . Breast cancer Paternal Aunt   . Cancer Paternal Aunt        breast  . Colon cancer Neg Hx   . Anesthesia problems Neg Hx   . Hypotension Neg Hx   . Malignant hyperthermia Neg Hx   . Pseudochol deficiency Neg Hx     Social History   Socioeconomic History  . Marital status: Married    Spouse name: Not on file  . Number of children: 4  . Years of education: Not on file  . Highest education level: Not on  file  Social Needs  . Financial resource strain: Not on file  . Food insecurity - worry: Not on file  . Food insecurity - inability: Not on file  . Transportation needs - medical: Not on file  . Transportation needs - non-medical: Not on file  Occupational History  . Occupation: Retired  Tobacco Use  . Smoking status: Never Smoker  . Smokeless tobacco: Never Used  Substance and Sexual Activity  . Alcohol use: No    Alcohol/week: 0.0 oz  . Drug use: No  . Sexual activity: No    Partners: Male  Other Topics Concern  . Not on file  Social History Narrative   Married 1955   2 sons - '59, '61, 2 daughters '55, '57; 8 grandchildren; 1 great grand   I-ADLs       Outpatient Medications Prior to Visit  Medication Sig Dispense Refill  . ALPRAZolam (XANAX) 0.25 MG tablet TAKE ONE  TABLET BY MOUTH TWICE DIALY AS NEEDED FOR ANXIETY 20 tablet 1  . B Complex Vitamins (VITAMIN B COMPLEX PO) Take 1 tablet by mouth daily.     . Blood Glucose Monitoring Suppl (ONE TOUCH ULTRA 2) w/Device KIT See admin instructions.  0  . furosemide (LASIX) 20 MG tablet Take 1 tablet (20 mg total) by mouth daily as needed for fluid or edema. 30 tablet 1  . glucose blood (IGLUCOSE TEST STRIPS) test strip Test Blood Sugars every day as needed 100 each 5  . glucose monitoring kit (FREESTYLE) monitoring kit 1 each by Does not apply route as needed for other. 1 each 0  . Lancets MISC Test Blood Sugars every day as needed 100 each 5  . Multiple Vitamins-Minerals (MULTIVITAMIN ADULTS) TABS Take 1 tablet by mouth daily.    . potassium chloride SA (K-DUR,KLOR-CON) 20 MEQ tablet Take 1 tablet (20 mEq total) by mouth daily as needed (lasix use). 30 tablet 0  . warfarin (COUMADIN) 5 MG tablet TAKE AS DIRECTED BY COUMADIN CLINIC. (Patient taking differently: Take 1 tablet (5 mg) by mouth daily except on Mon Take 0.5 tablet (2.5 mg)) 90 tablet 3  . ciprofloxacin (CIPRO) 250 MG tablet Take 1 tablet (250 mg total) by mouth 2 (two) times daily. For 3 days 6 tablet 0  . losartan (COZAAR) 100 MG tablet TAKE 1 TABLET (100 MG TOTAL) BY MOUTH DAILY. 90 tablet 1   No facility-administered medications prior to visit.     Allergies  Allergen Reactions  . Amlodipine Swelling    "my whole body swelled"  . Carvedilol Swelling    "my whole body swelled"  . Cefuroxime Axetil     REACTION: Ddoes not remember reaction  . Chlorthalidone     Patient reported extreme weakness.  . Statins     Weakness, pain Lipitor and Pravachol  . Sulfonamide Derivatives     REACTION: Rash    Review of Systems  Constitutional: Negative for fever and malaise/fatigue.  HENT: Negative for congestion.   Eyes: Negative for blurred vision.  Respiratory: Negative for shortness of breath.   Cardiovascular: Negative for chest pain,  palpitations and leg swelling.  Gastrointestinal: Negative for abdominal pain, blood in stool and nausea.  Genitourinary: Negative for dysuria and frequency.  Musculoskeletal: Negative for falls.  Skin: Negative for rash.  Neurological: Negative for dizziness, loss of consciousness and headaches.  Endo/Heme/Allergies: Negative for environmental allergies.  Psychiatric/Behavioral: Negative for depression. The patient is not nervous/anxious.        Objective:  Physical Exam  Constitutional: She is oriented to person, place, and time. She appears well-developed and well-nourished. No distress.  HENT:  Head: Normocephalic and atraumatic.  Nose: Nose normal.  Eyes: Right eye exhibits no discharge. Left eye exhibits no discharge. No scleral icterus.  Neck: Normal range of motion. Neck supple.  Cardiovascular: Normal rate and regular rhythm.  Murmur heard. Pulmonary/Chest: Effort normal and breath sounds normal.  Abdominal: Soft. Bowel sounds are normal. There is no tenderness.  Musculoskeletal: She exhibits no edema.  Neurological: She is alert and oriented to person, place, and time.  Skin: Skin is warm and dry.  Psychiatric: She has a normal mood and affect.  Nursing note and vitals reviewed.   BP 134/80 (BP Location: Left Arm, Patient Position: Sitting, Cuff Size: Normal)   Pulse 74   Temp 98.1 F (36.7 C) (Oral)   Resp 16   Ht 5' 2.99" (1.6 m)   Wt 173 lb 9.6 oz (78.7 kg)   SpO2 96%   BMI 30.76 kg/m  Wt Readings from Last 3 Encounters:  03/12/17 173 lb 9.6 oz (78.7 kg)  12/04/16 168 lb (76.2 kg)  10/16/16 162 lb 6.4 oz (73.7 kg)   BP Readings from Last 3 Encounters:  03/12/17 134/80  12/04/16 124/78  10/16/16 (!) 146/88     Immunization History  Administered Date(s) Administered  . Influenza Split 11/14/2010  . Influenza Whole 11/08/2005, 10/28/2007, 11/23/2008, 11/26/2009  . Influenza, High Dose Seasonal PF 10/19/2015, 10/16/2016  . Influenza,inj,Quad PF,6+  Mos 11/26/2012, 09/22/2013, 10/20/2014  . Pneumococcal Conjugate-13 04/01/2013  . Pneumococcal Polysaccharide-23 10/30/2001, 07/27/2009  . Td 11/27/2001  . Tetanus 04/01/2013    Health Maintenance  Topic Date Due  . OPHTHALMOLOGY EXAM  08/10/2015  . FOOT EXAM  04/17/2017  . HEMOGLOBIN A1C  09/02/2017  . TETANUS/TDAP  04/02/2023  . INFLUENZA VACCINE  Completed  . DEXA SCAN  Completed  . PNA vac Low Risk Adult  Completed    Lab Results  Component Value Date   WBC 8.7 03/05/2017   HGB 13.9 03/05/2017   HCT 42.2 03/05/2017   PLT 350.0 03/05/2017   GLUCOSE 125 (H) 03/05/2017   CHOL 213 (H) 03/05/2017   TRIG 197.0 (H) 03/05/2017   HDL 40.30 03/05/2017   LDLDIRECT 143.0 07/25/2016   LDLCALC 134 (H) 03/05/2017   ALT 10 03/05/2017   AST 14 03/05/2017   NA 141 03/05/2017   K 4.1 03/05/2017   CL 107 03/05/2017   CREATININE 0.81 03/05/2017   BUN 27 (H) 03/05/2017   CO2 28 03/05/2017   TSH 3.28 03/05/2017   INR 2.9 02/23/2017   HGBA1C 6.5 03/05/2017   MICROALBUR 1.5 10/20/2014    Lab Results  Component Value Date   TSH 3.28 03/05/2017   Lab Results  Component Value Date   WBC 8.7 03/05/2017   HGB 13.9 03/05/2017   HCT 42.2 03/05/2017   MCV 91.6 03/05/2017   PLT 350.0 03/05/2017   Lab Results  Component Value Date   NA 141 03/05/2017   K 4.1 03/05/2017   CHLORIDE 109 08/12/2013   CO2 28 03/05/2017   GLUCOSE 125 (H) 03/05/2017   BUN 27 (H) 03/05/2017   CREATININE 0.81 03/05/2017   BILITOT 0.4 03/05/2017   ALKPHOS 62 03/05/2017   AST 14 03/05/2017   ALT 10 03/05/2017   PROT 7.4 03/05/2017   ALBUMIN 3.7 03/05/2017   CALCIUM 9.0 03/05/2017   ANIONGAP 6 08/17/2016   GFR 72.09 03/05/2017   Lab  Results  Component Value Date   CHOL 213 (H) 03/05/2017   Lab Results  Component Value Date   HDL 40.30 03/05/2017   Lab Results  Component Value Date   LDLCALC 134 (H) 03/05/2017   Lab Results  Component Value Date   TRIG 197.0 (H) 03/05/2017   Lab Results    Component Value Date   CHOLHDL 5 03/05/2017   Lab Results  Component Value Date   HGBA1C 6.5 03/05/2017         Assessment & Plan:   Problem List Items Addressed This Visit    Hyperlipidemia    Does not tolerate statins. Encouraged heart healthy diet, increase exercise, avoid trans fats, consider a krill oil cap daily      Relevant Medications   losartan (COZAAR) 100 MG tablet   Essential hypertension    Well controlled, no changes to meds. Encouraged heart healthy diet such as the DASH diet and exercise as tolerated.       Relevant Medications   losartan (COZAAR) 100 MG tablet   Anxiety state    Has been struggling with palpitations during stressful events and yesterday she went to see her sister with dementia and she had an episode that lasted about a minute and a 1/2 but then resolved. Can use Xanax sparingly but has not needed for nearly 4 months      Obesity (BMI 30-39.9)    Encouraged DASH diet, decrease po intake and increase exercise as tolerated. Needs 7-8 hours of sleep nightly. Avoid trans fats, eat small, frequent meals every 4-5 hours with lean proteins, complex carbs and healthy fats. Minimize simple carbs, bariatric referral      Diabetes mellitus type 2 in obese (HCC)    hgba1c acceptable, minimize simple carbs. Increase exercise as tolerated. Continue current meds      Relevant Medications   losartan (COZAAR) 100 MG tablet      I have discontinued Kathrine Cords. Felber's ciprofloxacin. I am also having her maintain her B Complex Vitamins (VITAMIN B COMPLEX PO), potassium chloride SA, warfarin, ALPRAZolam, glucose monitoring kit, Lancets, glucose blood, MULTIVITAMIN ADULTS, ONE TOUCH ULTRA 2, furosemide, and losartan.  Meds ordered this encounter  Medications  . losartan (COZAAR) 100 MG tablet    Sig: Take 1 tablet (100 mg total) by mouth daily.    Dispense:  90 tablet    Refill:  1    CMA served as Education administrator during this visit. History, Physical and Plan  performed by medical provider. Documentation and orders reviewed and attested to.  Penni Homans, MD

## 2017-03-12 NOTE — Assessment & Plan Note (Signed)
hgba1c acceptable, minimize simple carbs. Increase exercise as tolerated. Continue current meds 

## 2017-03-23 ENCOUNTER — Ambulatory Visit: Payer: Medicare Other | Admitting: General Practice

## 2017-03-23 DIAGNOSIS — Z7901 Long term (current) use of anticoagulants: Secondary | ICD-10-CM

## 2017-03-23 DIAGNOSIS — Z86718 Personal history of other venous thrombosis and embolism: Secondary | ICD-10-CM

## 2017-03-23 LAB — POCT INR: INR: 2.8

## 2017-03-23 NOTE — Patient Instructions (Addendum)
Pre visit review using our clinic review tool, if applicable. No additional management support is needed unless otherwise documented below in the visit note.  Continue to take 1 tablet daily with 1/2 tablet on Mondays.  Re-check in 4 weeks.

## 2017-03-26 ENCOUNTER — Other Ambulatory Visit (INDEPENDENT_AMBULATORY_CARE_PROVIDER_SITE_OTHER): Payer: Medicare Other

## 2017-03-26 ENCOUNTER — Telehealth: Payer: Self-pay | Admitting: Family Medicine

## 2017-03-26 DIAGNOSIS — R300 Dysuria: Secondary | ICD-10-CM

## 2017-03-26 MED ORDER — NITROFURANTOIN MONOHYD MACRO 100 MG PO CAPS: 100.0000 mg | ORAL_CAPSULE | Freq: Two times a day (BID) | ORAL | 0 refills | Status: DC

## 2017-03-26 NOTE — Telephone Encounter (Signed)
Copied from [place]. Topic: Quick Communication - See Telephone Encounter >> Mar 26, 2017  8:57 AM Ivar Drape wrote: CRM for notification. See Telephone encounter for:  03/26/17. Patient expressed that she has a bad UTI, and she was informed by Dr. Charlett Blake that the next time that happens to call in for orders for a lab to check for a UTI, because this happens frequently. Please give her at [phone] ASAP.

## 2017-03-26 NOTE — Telephone Encounter (Signed)
Orders placed Medication sent  Patient made aware

## 2017-03-26 NOTE — Addendum Note (Signed)
Addended by: Magdalene Molly A on: 03/26/2017 01:27 PM   Modules accepted: Orders

## 2017-03-27 LAB — URINALYSIS
Bilirubin Urine: NEGATIVE
Hgb urine dipstick: NEGATIVE
Leukocytes, UA: NEGATIVE
Nitrite: NEGATIVE
Specific Gravity, Urine: >=1.03 — AB (ref 1.000–1.030)
Total Protein, Urine: NEGATIVE
Urine Glucose: NEGATIVE
Urobilinogen, UA: 0.2 (ref 0.0–1.0)
pH: 5.5 (ref 5.0–8.0)

## 2017-03-27 LAB — URINE CULTURE
MICRO NUMBER:: 90243699
SPECIMEN QUALITY:: ADEQUATE

## 2017-03-28 ENCOUNTER — Telehealth: Payer: Self-pay | Admitting: Family Medicine

## 2017-03-28 NOTE — Telephone Encounter (Signed)
Copied from [place] [id number]. Topic: Quick Communication - See Telephone Encounter >> Mar 28, 2017  1:49 PM Aurelio Brash B wrote: CRM for notification. See Telephone encounter for:  Pt is asking for UA results 03/28/17.

## 2017-03-29 ENCOUNTER — Encounter: Payer: Self-pay | Admitting: Medical

## 2017-03-29 ENCOUNTER — Ambulatory Visit: Payer: Medicare Other | Admitting: Medical

## 2017-03-29 VITALS — BP 126/72 | HR 100 | Temp 98.7°F | Resp 16 | Ht 63.0 in | Wt 172.0 lb

## 2017-03-29 DIAGNOSIS — R05 Cough: Secondary | ICD-10-CM | POA: Diagnosis not present

## 2017-03-29 DIAGNOSIS — R059 Cough, unspecified: Secondary | ICD-10-CM

## 2017-03-29 DIAGNOSIS — J111 Influenza due to unidentified influenza virus with other respiratory manifestations: Secondary | ICD-10-CM

## 2017-03-29 DIAGNOSIS — J3489 Other specified disorders of nose and nasal sinuses: Secondary | ICD-10-CM | POA: Diagnosis not present

## 2017-03-29 DIAGNOSIS — R509 Fever, unspecified: Secondary | ICD-10-CM | POA: Diagnosis not present

## 2017-03-29 LAB — POCT INFLUENZA A/B
Influenza A, POC: POSITIVE — AB
Influenza B, POC: NEGATIVE

## 2017-03-29 MED ORDER — AZITHROMYCIN 250 MG PO TABS: ORAL_TABLET | ORAL | 0 refills | Status: DC

## 2017-03-29 MED ORDER — OSELTAMIVIR PHOSPHATE 75 MG PO CAPS: 75.0000 mg | ORAL_CAPSULE | Freq: Two times a day (BID) | ORAL | 0 refills | Status: DC

## 2017-03-29 MED ORDER — HYDROCODONE-HOMATROPINE 5-1.5 MG/5ML PO SYRP: 5.0000 mL | ORAL_SOLUTION | Freq: Three times a day (TID) | ORAL | 0 refills | Status: DC | PRN

## 2017-03-29 MED ORDER — FLUTICASONE PROPIONATE 50 MCG/ACT NA SUSP
2.0000 | Freq: Every day | NASAL | 1 refills | Status: DC
Start: 2017-03-29 — End: 2017-07-31

## 2017-03-29 NOTE — Patient Instructions (Addendum)
You do have positive type a flu.  Please start Tamiflu today.  For nasal congestion I am prescribing Flonase.  For moderate to severe cough, I am prescribing Hycodan.  You might just use Hycodan at night if you feel that he is over sedating you.  I am also making a description of a azithromycin available.  We are heading towards the weekend and if you get secondary signs and symptoms of infection(sinusitis like, bronchitis or pneumonia like), then I want you to go ahead and start a azithromycin.  If you do start a azithromycin then would recommend that you get INR in about 7-10 days after starting the antibiotic.  If you have worse signs and symptoms also please let us know early next week.  In that event would get CBC and chest x-ray.  Follow-up in 7-10 days or as needed.

## 2017-03-29 NOTE — Progress Notes (Signed)
Subjective:    Patient ID: [name] Little, female    DOB: 28-Jan-1934, 84 y.o.   MRN: [id number]  HPI Pt has rapid acute onset of illness 1  day ago. Reports fever, body aches, fatigue  ,runny nose,  and some productive cough. No wheezing. Pt did get flu vaccine this year.  Fever 101.7 last night.    Review of Systems  Constitutional: Positive for fatigue and fever. Negative for chills.       Mild fatigue.  HENT: Positive for congestion and sinus pressure. Negative for nosebleeds, rhinorrhea and sinus pain.   Respiratory: Positive for cough. Negative for chest tightness, shortness of breath and wheezing.   Cardiovascular: Negative for chest pain and palpitations.  Gastrointestinal: Negative for abdominal pain.  Genitourinary: Negative for decreased urine volume, difficulty urinating, dysuria, flank pain, frequency, urgency and vaginal bleeding.  Musculoskeletal: Positive for myalgias. Negative for gait problem.  Skin: Negative for rash.  Neurological: Negative for dizziness, speech difficulty and headaches.  Hematological: Negative for adenopathy. Does not bruise/bleed easily.  Psychiatric/Behavioral: Negative for behavioral problems, confusion and sleep disturbance. The patient is not nervous/anxious.     Past Medical History:  Diagnosis Date  . Anxiety state, unspecified 09/22/2013  . Arthritis    lumbar stenosis, radiculopathy, OA- L hip  . BCC (basal cell carcinoma), scalp/neck 09/27/2015  . Blood clotting disorder (HCC)    V5 factor  . Blood dyscrasia    factor 5 deficiency  . Breast cancer ([place]) 2010   RIGHT breast  . Clostridium difficile infection   . Clotting disorder ([place])   . Diabetes mellitus type 2 in obese ([place]) 02/26/2014  . DVT (deep venous thrombosis) ([place])   . Factor V deficiency ([place])   . Hemorrhoids   . Hyperglycemia 02/26/2014  . Hyperlipemia   . Hypertension   . Infiltrating ductal carcinoma of breast ([place])    stage one right   . Low back pain  03/26/2014  . Macular degeneration   . Mitral valve regurgitation 09/01/2016  . Mixed hyperlipidemia 05/18/2015  . Nocturia 02/26/2014  . Paroxysmal supraventricular tachycardia ([place])   . Personal history of colonic polyps    hyperplastic  . Thoracic aortic aneurysm ([place])   . TMJ pain dysfunction syndrome      Social History   Socioeconomic History  . Marital status: Married    Spouse name: Not on file  . Number of children: 4  . Years of education: Not on file  . Highest education level: Not on file  Social Needs  . Financial resource strain: Not on file  . Food insecurity - worry: Not on file  . Food insecurity - inability: Not on file  . Transportation needs - medical: Not on file  . Transportation needs - non-medical: Not on file  Occupational History  . Occupation: Retired  Tobacco Use  . Smoking status: Never Smoker  . Smokeless tobacco: Never Used  Substance and Sexual Activity  . Alcohol use: No    Alcohol/week: 0.0 oz  . Drug use: No  . Sexual activity: No    Partners: Male  Other Topics Concern  . Not on file  Social History Narrative   Married 1955   2 sons - '59, '61, 2 daughters '55, '57; 8 grandchildren; 1 great grand   I-ADLs       Past Surgical History:  Procedure Laterality Date  . ABDOMINAL HYSTERECTOMY     1960- ? vaginal hysterectomy, not laparoscopic   .  BREAST LUMPECTOMY  2010   right Dr. [name]  . COLONOSCOPY    . EYE SURGERY     bilateral cataracts- /w IOL   . JOINT REPLACEMENT     R hip replacement   . LAPAROSCOPIC HYSTERECTOMY    . LUMBAR LAMINECTOMY/DECOMPRESSION MICRODISCECTOMY  04/11/2011   Procedure: LUMBAR LAMINECTOMY/DECOMPRESSION MICRODISCECTOMY;  Surgeon: [name], MD;  Location: [facility];  Service: Neurosurgery;  Laterality: N/A;  Lumbar Five-Sacral One Microdiscectomy  . right calf surgery     for cancer  . SPLENECTOMY    . TOTAL HIP ARTHROPLASTY  1999   right  . UPPER GASTROINTESTINAL ENDOSCOPY      Family  History  Problem Relation Age of Onset  . Heart disease Mother   . Heart disease Father   . Alzheimer's disease Sister   . Heart disease Unknown        uncles  . Clotting disorder Maternal Uncle   . Breast cancer Paternal Aunt   . Cancer Paternal Aunt        breast  . Colon cancer Neg Hx   . Anesthesia problems Neg Hx   . Hypotension Neg Hx   . Malignant hyperthermia Neg Hx   . Pseudochol deficiency Neg Hx     Allergies  Allergen Reactions  . Amlodipine Swelling    "my whole body swelled"  . Carvedilol Swelling    "my whole body swelled"  . Cefuroxime Axetil     REACTION: Ddoes not remember reaction  . Chlorthalidone     Patient reported extreme weakness.  . Statins     Weakness, pain Lipitor and Pravachol  . Sulfonamide Derivatives     REACTION: Rash    Current Outpatient Medications on File Prior to Visit  Medication Sig Dispense Refill  . ALPRAZolam (XANAX) 0.25 MG tablet TAKE ONE TABLET BY MOUTH TWICE DIALY AS NEEDED FOR ANXIETY 20 tablet 1  . B Complex Vitamins (VITAMIN B COMPLEX PO) Take 1 tablet by mouth daily.     . Blood Glucose Monitoring Suppl (ONE TOUCH ULTRA 2) w/Device KIT See admin instructions.  0  . furosemide (LASIX) 20 MG tablet Take 1 tablet (20 mg total) by mouth daily as needed for fluid or edema. 30 tablet 1  . glucose blood (IGLUCOSE TEST STRIPS) test strip Test Blood Sugars every day as needed 100 each 5  . glucose monitoring kit (FREESTYLE) monitoring kit 1 each by Does not apply route as needed for other. 1 each 0  . Lancets MISC Test Blood Sugars every day as needed 100 each 5  . losartan (COZAAR) 100 MG tablet Take 1 tablet (100 mg total) by mouth daily. 90 tablet 1  . Multiple Vitamins-Minerals (MULTIVITAMIN ADULTS) TABS Take 1 tablet by mouth daily.    . potassium chloride SA (K-DUR,KLOR-CON) 20 MEQ tablet Take 1 tablet (20 mEq total) by mouth daily as needed (lasix use). 30 tablet 0  . warfarin (COUMADIN) 5 MG tablet TAKE AS DIRECTED BY  COUMADIN CLINIC. (Patient taking differently: Take 1 tablet (5 mg) by mouth daily except on Mon Take 0.5 tablet (2.5 mg)) 90 tablet 3   No current facility-administered medications on file prior to visit.     BP 126/72 (BP Location: Left Arm, Patient Position: Sitting, Cuff Size: Small)   Pulse 100   Temp 98.7 F (37.1 C) (Oral)   Resp 16   Ht 5' 3" (1.6 m)   Wt 172 lb (78 kg)   SpO2 97%  BMI 30.47 kg/m       Objective:   Physical Exam  General  Mental Status - Alert. General Appearance - Well groomed. Not in acute distress.  Skin Rashes- No Rashes.  HEENT Head- Normal. Ear Auditory Canal - Left- Normal. Right - Normal.Tympanic Membrane- Left- Normal. Right- Normal. Eye Sclera/Conjunctiva- Left- Normal. Right- Normal. Nose & Sinuses Nasal Mucosa- Left-  Boggy and Congested. Right-  Boggy and  Congested.Mild/faint maxillary pressure but frontal sinus pressure. Mouth & Throat Lips: Upper Lip- Normal: no dryness, cracking, pallor, cyanosis, or vesicular eruption. Lower Lip-Normal: no dryness, cracking, pallor, cyanosis or vesicular eruption. Buccal Mucosa- Bilateral- No Aphthous ulcers. Oropharynx- No Discharge or Erythema. Tonsils: Characteristics- Bilateral- No Erythema or Congestion. Size/Enlargement- Bilateral- No enlargement. Discharge- bilateral-None.  Neck Neck- Supple. No Masses.   Chest and Lung Exam Auscultation: Breath Sounds:-Clear even and unlabored.  Cardiovascular Auscultation:Rythm- Regular, rate and rhythm. Murmurs & Other Heart Sounds:Ausculatation of the heart reveal- No Murmurs.  Lymphatic Head & Neck General Head & Neck Lymphatics: Bilateral: Description- No Localized lymphadenopathy.       Assessment & Plan:  You do have positive type a flu.  Please start Tamiflu today.  For nasal congestion I am prescribing Flonase.  For moderate to severe cough, I am prescribing Hycodan.  You might just use Hycodan at night if you feel that he is over  sedating you.  I am also making a description of a azithromycin available.  We are heading towards the weekend and if you get secondary signs and symptoms of infection(sinusitis like, bronchitis or pneumonia like), then I want you to go ahead and start a azithromycin.  If you do start a azithromycin then would recommend that you get INR in about 7-10 days after starting the antibiotic.  If you have worse signs and symptoms also please let us know early next week.  In that event would get CBC and chest x-ray.  Follow-up in 7-10 days or as needed.  Mackie Pai, PA-C

## 2017-03-30 NOTE — Telephone Encounter (Signed)
Spoke with pt regarding results.  

## 2017-04-20 ENCOUNTER — Ambulatory Visit: Payer: Medicare Other

## 2017-04-20 ENCOUNTER — Ambulatory Visit: Payer: Medicare Other | Admitting: General Practice

## 2017-04-20 DIAGNOSIS — Z86718 Personal history of other venous thrombosis and embolism: Secondary | ICD-10-CM

## 2017-04-20 DIAGNOSIS — Z7901 Long term (current) use of anticoagulants: Secondary | ICD-10-CM | POA: Diagnosis not present

## 2017-04-20 LAB — POCT INR: INR: 3

## 2017-04-20 NOTE — Patient Instructions (Addendum)
Pre visit review using our clinic review tool, if applicable. No additional management support is needed unless otherwise documented below in the visit note.  Take 1/2 tablet today (3/22) and then continue to take 1 tablet daily with 1/2 tablet on Mondays.  Re-check in 6 weeks.

## 2017-04-28 ENCOUNTER — Other Ambulatory Visit: Payer: Self-pay | Admitting: Family Medicine

## 2017-05-01 NOTE — Telephone Encounter (Signed)
Patient calling to check on status of warfarin because she is out of the medication.

## 2017-05-25 ENCOUNTER — Ambulatory Visit: Payer: Medicare Other | Admitting: General Practice

## 2017-05-25 DIAGNOSIS — Z7901 Long term (current) use of anticoagulants: Secondary | ICD-10-CM | POA: Diagnosis not present

## 2017-05-25 DIAGNOSIS — Z86718 Personal history of other venous thrombosis and embolism: Secondary | ICD-10-CM

## 2017-05-25 LAB — POCT INR: INR: 2.8

## 2017-05-25 NOTE — Patient Instructions (Addendum)
Pre visit review using our clinic review tool, if applicable. No additional management support is needed unless otherwise documented below in the visit note.  Continue to take 1 tablet daily with 1/2 tablet on Mondays.  Re-check in 6 weeks.  

## 2017-06-15 ENCOUNTER — Encounter: Payer: Self-pay | Admitting: Family Medicine

## 2017-06-15 ENCOUNTER — Ambulatory Visit: Payer: Medicare Other | Admitting: Family Medicine

## 2017-06-15 VITALS — BP 158/90 | HR 74 | Temp 97.7°F | Resp 18 | Wt 172.4 lb

## 2017-06-15 DIAGNOSIS — E782 Mixed hyperlipidemia: Secondary | ICD-10-CM | POA: Diagnosis not present

## 2017-06-15 DIAGNOSIS — R739 Hyperglycemia, unspecified: Secondary | ICD-10-CM | POA: Insufficient documentation

## 2017-06-15 DIAGNOSIS — E669 Obesity, unspecified: Secondary | ICD-10-CM | POA: Diagnosis not present

## 2017-06-15 DIAGNOSIS — Z87440 Personal history of urinary (tract) infections: Secondary | ICD-10-CM | POA: Diagnosis not present

## 2017-06-15 DIAGNOSIS — I471 Supraventricular tachycardia: Secondary | ICD-10-CM | POA: Diagnosis not present

## 2017-06-15 DIAGNOSIS — E1169 Type 2 diabetes mellitus with other specified complication: Secondary | ICD-10-CM

## 2017-06-15 DIAGNOSIS — I10 Essential (primary) hypertension: Secondary | ICD-10-CM

## 2017-06-15 DIAGNOSIS — Z7901 Long term (current) use of anticoagulants: Secondary | ICD-10-CM

## 2017-06-15 DIAGNOSIS — Z79899 Other long term (current) drug therapy: Secondary | ICD-10-CM | POA: Diagnosis not present

## 2017-06-15 LAB — CBC
HCT: 45.6 % (ref 36.0–46.0)
Hemoglobin: 14.9 g/dL (ref 12.0–15.0)
MCHC: 32.7 g/dL (ref 30.0–36.0)
MCV: 91.4 fl (ref 78.0–100.0)
Platelets: 364 10*3/uL (ref 150.0–400.0)
RBC: 4.98 Mil/uL (ref 3.87–5.11)
RDW: 15.2 % (ref 11.5–15.5)
WBC: 8.8 10*3/uL (ref 4.0–10.5)

## 2017-06-15 LAB — LIPID PANEL
Cholesterol: 224 mg/dL — ABNORMAL HIGH (ref 0–200)
HDL: 35.6 mg/dL — ABNORMAL LOW (ref >39.00)
NonHDL: 188.45
Total CHOL/HDL Ratio: 6
Triglycerides: 216 mg/dL — ABNORMAL HIGH (ref 0.0–149.0)
VLDL: 43.2 mg/dL — ABNORMAL HIGH (ref 0.0–40.0)

## 2017-06-15 LAB — COMPREHENSIVE METABOLIC PANEL
ALT: 12 U/L (ref 0–35)
AST: 17 U/L (ref 0–37)
Albumin: 3.9 g/dL (ref 3.5–5.2)
Alkaline Phosphatase: 68 U/L (ref 39–117)
BUN: 21 mg/dL (ref 6–23)
CO2: 30 mEq/L (ref 19–32)
Calcium: 9.4 mg/dL (ref 8.4–10.5)
Chloride: 105 mEq/L (ref 96–112)
Creatinine, Ser: 0.82 mg/dL (ref 0.40–1.20)
GFR: 71.02 mL/min (ref >60.00)
Glucose, Bld: 102 mg/dL — ABNORMAL HIGH (ref 70–99)
Potassium: 4.4 mEq/L (ref 3.5–5.1)
Sodium: 141 mEq/L (ref 135–145)
Total Bilirubin: 0.6 mg/dL (ref 0.2–1.2)
Total Protein: 7.4 g/dL (ref 6.0–8.3)

## 2017-06-15 LAB — TSH: TSH: 3.08 u[IU]/mL (ref 0.35–4.50)

## 2017-06-15 LAB — LDL CHOLESTEROL, DIRECT: Direct LDL: 145 mg/dL

## 2017-06-15 LAB — HEMOGLOBIN A1C: Hgb A1c MFr Bld: 6.6 % — ABNORMAL HIGH (ref 4.6–6.5)

## 2017-06-15 MED ORDER — ALPRAZOLAM 0.25 MG PO TABS: ORAL_TABLET | ORAL | 1 refills | Status: DC

## 2017-06-15 MED ORDER — LOSARTAN POTASSIUM 100 MG PO TABS: 100.0000 mg | ORAL_TABLET | Freq: Every day | ORAL | 1 refills | Status: DC

## 2017-06-15 NOTE — Progress Notes (Signed)
Subjective:  I acted as a Education administrator for Dr. Charlett Blake. [place], [place]  Patient ID: [name] Little, female    DOB: 01/03/1934, 84 y.o.   MRN: [id number]  No chief complaint on file.   HPI  Patient is in today for a 3 month follow up she is following up on her HTN, DM and various medical concerns and she fells well. No recent febrile ilness or hospitalizations.   Patient Care Team: Mosie Lukes, MD as PCP - General (Family Medicine) Zadie Rhine Clent Demark, MD as Consulting Physician (Ophthalmology)   Past Medical History:  Diagnosis Date  . Anxiety state, unspecified 09/22/2013  . Arthritis    lumbar stenosis, radiculopathy, OA- L hip  . BCC (basal cell carcinoma), scalp/neck 09/27/2015  . Blood clotting disorder (HCC)    V5 factor  . Blood dyscrasia    factor 5 deficiency  . Breast cancer ([place]) 2010   RIGHT breast  . Clostridium difficile infection   . Clotting disorder ([place])   . Diabetes mellitus type 2 in obese ([place]) 02/26/2014  . DVT (deep venous thrombosis) ([place])   . Factor V deficiency ([place])   . Hemorrhoids   . Hyperglycemia 02/26/2014  . Hyperlipemia   . Hypertension   . Infiltrating ductal carcinoma of breast ([place])    stage one right   . Low back pain 03/26/2014  . Macular degeneration   . Mitral valve regurgitation 09/01/2016  . Mixed hyperlipidemia 05/18/2015  . Nocturia 02/26/2014  . Paroxysmal supraventricular tachycardia ([place])   . Personal history of colonic polyps    hyperplastic  . Thoracic aortic aneurysm ([place])   . TMJ pain dysfunction syndrome     Past Surgical History:  Procedure Laterality Date  . ABDOMINAL HYSTERECTOMY     1960- ? vaginal hysterectomy, not laparoscopic   . BREAST LUMPECTOMY  2010   right Dr. [name]  . COLONOSCOPY    . EYE SURGERY     bilateral cataracts- /w IOL   . JOINT REPLACEMENT     R hip replacement   . LAPAROSCOPIC HYSTERECTOMY    . LUMBAR LAMINECTOMY/DECOMPRESSION MICRODISCECTOMY  04/11/2011   Procedure: LUMBAR  LAMINECTOMY/DECOMPRESSION MICRODISCECTOMY;  Surgeon: [name], MD;  Location: [facility];  Service: Neurosurgery;  Laterality: N/A;  Lumbar Five-Sacral One Microdiscectomy  . right calf surgery     for cancer  . SPLENECTOMY    . TOTAL HIP ARTHROPLASTY  1999   right  . UPPER GASTROINTESTINAL ENDOSCOPY      Family History  Problem Relation Age of Onset  . Heart disease Mother   . Heart disease Father   . Alzheimer's disease Sister   . Heart disease Unknown        uncles  . Clotting disorder Maternal Uncle   . Breast cancer Paternal Aunt   . Cancer Paternal Aunt        breast  . Colon cancer Neg Hx   . Anesthesia problems Neg Hx   . Hypotension Neg Hx   . Malignant hyperthermia Neg Hx   . Pseudochol deficiency Neg Hx     Social History   Socioeconomic History  . Marital status: Married    Spouse name: Not on file  . Number of children: 4  . Years of education: Not on file  . Highest education level: Not on file  Occupational History  . Occupation: Retired  Scientific laboratory technician  . Financial resource strain: Not on file  . Food insecurity:    Worry:  Not on file    Inability: Not on file  . Transportation needs:    Medical: Not on file    Non-medical: Not on file  Tobacco Use  . Smoking status: Never Smoker  . Smokeless tobacco: Never Used  Substance and Sexual Activity  . Alcohol use: No    Alcohol/week: 0.0 oz  . Drug use: No  . Sexual activity: Never    Partners: Male  Lifestyle  . Physical activity:    Days per week: Not on file    Minutes per session: Not on file  . Stress: Not on file  Relationships  . Social connections:    Talks on phone: Not on file    Gets together: Not on file    Attends religious service: Not on file    Active member of club or organization: Not on file    Attends meetings of clubs or organizations: Not on file    Relationship status: Not on file  . Intimate partner violence:    Fear of current or ex partner: Not on file     Emotionally abused: Not on file    Physically abused: Not on file    Forced sexual activity: Not on file  Other Topics Concern  . Not on file  Social History Narrative   Married 1955   2 sons - '59, '61, 2 daughters '55, '57; 8 grandchildren; 1 great grand   I-ADLs       Outpatient Medications Prior to Visit  Medication Sig Dispense Refill  . B Complex Vitamins (VITAMIN B COMPLEX PO) Take 1 tablet by mouth daily.     . Blood Glucose Monitoring Suppl (ONE TOUCH ULTRA 2) w/Device KIT See admin instructions.  0  . fluticasone (FLONASE) 50 MCG/ACT nasal spray Place 2 sprays into both nostrils daily. 16 g 1  . furosemide (LASIX) 20 MG tablet Take 1 tablet (20 mg total) by mouth daily as needed for fluid or edema. 30 tablet 1  . glucose blood (IGLUCOSE TEST STRIPS) test strip Test Blood Sugars every day as needed 100 each 5  . glucose monitoring kit (FREESTYLE) monitoring kit 1 each by Does not apply route as needed for other. 1 each 0  . HYDROcodone-homatropine (HYCODAN) 5-1.5 MG/5ML syrup Take 5 mLs by mouth every 8 (eight) hours as needed. 100 mL 0  . Lancets MISC Test Blood Sugars every day as needed 100 each 5  . Multiple Vitamins-Minerals (MULTIVITAMIN ADULTS) TABS Take 1 tablet by mouth daily.    Marland Kitchen oseltamivir (TAMIFLU) 75 MG capsule Take 1 capsule (75 mg total) by mouth 2 (two) times daily. 10 capsule 0  . potassium chloride SA (K-DUR,KLOR-CON) 20 MEQ tablet Take 1 tablet (20 mEq total) by mouth daily as needed (lasix use). 30 tablet 0  . warfarin (COUMADIN) 5 MG tablet Take 1 tablet (5 mg) by mouth daily except on Mon Take 0.5 tablet (2.5 mg) 90 tablet 3  . ALPRAZolam (XANAX) 0.25 MG tablet TAKE ONE TABLET BY MOUTH TWICE DIALY AS NEEDED FOR ANXIETY 20 tablet 1  . losartan (COZAAR) 100 MG tablet Take 1 tablet (100 mg total) by mouth daily. 90 tablet 1  . azithromycin (ZITHROMAX) 250 MG tablet Take 2 tablets by mouth on day 1, followed by 1 tablet by mouth daily for 4 days. 6 tablet 0    No facility-administered medications prior to visit.     Allergies  Allergen Reactions  . Amlodipine Swelling    "my whole body  swelled"  . Carvedilol Swelling    "my whole body swelled"  . Cefuroxime Axetil     REACTION: Ddoes not remember reaction  . Chlorthalidone     Patient reported extreme weakness.  . Statins     Weakness, pain Lipitor and Pravachol  . Sulfonamide Derivatives     REACTION: Rash    Review of Systems  Constitutional: Negative for fever and malaise/fatigue.  HENT: Negative for congestion.   Eyes: Negative for blurred vision.  Respiratory: Negative for shortness of breath.   Cardiovascular: Negative for chest pain, palpitations and leg swelling.  Gastrointestinal: Negative for abdominal pain, blood in stool and nausea.  Genitourinary: Negative for dysuria and frequency.  Musculoskeletal: Negative for falls.  Skin: Negative for rash.  Neurological: Negative for dizziness, loss of consciousness and headaches.  Endo/Heme/Allergies: Negative for environmental allergies.  Psychiatric/Behavioral: Negative for depression. The patient is not nervous/anxious.        Objective:    Physical Exam  Constitutional: She is oriented to person, place, and time. No distress.  HENT:  Head: Normocephalic and atraumatic.  Eyes: Conjunctivae are normal.  Neck: Neck supple. No thyromegaly present.  Cardiovascular: Normal rate, regular rhythm and normal heart sounds.  No murmur heard. Pulmonary/Chest: Effort normal and breath sounds normal. She has no wheezes.  Abdominal: She exhibits no distension and no mass.  Musculoskeletal: She exhibits no edema.  Lymphadenopathy:    She has no cervical adenopathy.  Neurological: She is alert and oriented to person, place, and time.  Skin: Skin is warm and dry. No rash noted. She is not diaphoretic.  Psychiatric: Judgment normal.    BP (!) 158/90 (BP Location: Left Arm, Patient Position: Sitting, Cuff Size: Normal)    Pulse 74   Temp 97.7 F (36.5 C) (Oral)   Resp 18   Wt 172 lb 6.4 oz (78.2 kg)   SpO2 97%   BMI 30.54 kg/m  Wt Readings from Last 3 Encounters:  06/15/17 172 lb 6.4 oz (78.2 kg)  03/29/17 172 lb (78 kg)  03/12/17 173 lb 9.6 oz (78.7 kg)   BP Readings from Last 3 Encounters:  06/15/17 (!) 158/90  03/29/17 126/72  03/12/17 134/80     Immunization History  Administered Date(s) Administered  . Influenza Split 11/14/2010  . Influenza Whole 11/08/2005, 10/28/2007, 11/23/2008, 11/26/2009  . Influenza, High Dose Seasonal PF 10/19/2015, 10/16/2016  . Influenza,inj,Quad PF,6+ Mos 11/26/2012, 09/22/2013, 10/20/2014  . Pneumococcal Conjugate-13 04/01/2013  . Pneumococcal Polysaccharide-23 10/30/2001, 07/27/2009  . Td 11/27/2001  . Tetanus 04/01/2013    Health Maintenance  Topic Date Due  . OPHTHALMOLOGY EXAM  08/10/2015  . FOOT EXAM  04/17/2017  . INFLUENZA VACCINE  08/30/2017  . HEMOGLOBIN A1C  12/16/2017  . TETANUS/TDAP  04/02/2023  . DEXA SCAN  Completed  . PNA vac Low Risk Adult  Completed    Lab Results  Component Value Date   WBC 8.8 06/15/2017   HGB 14.9 06/15/2017   HCT 45.6 06/15/2017   PLT 364.0 06/15/2017   GLUCOSE 102 (H) 06/15/2017   CHOL 224 (H) 06/15/2017   TRIG 216.0 (H) 06/15/2017   HDL 35.60 (L) 06/15/2017   LDLDIRECT 145.0 06/15/2017   LDLCALC 134 (H) 03/05/2017   ALT 12 06/15/2017   AST 17 06/15/2017   NA 141 06/15/2017   K 4.4 06/15/2017   CL 105 06/15/2017   CREATININE 0.82 06/15/2017   BUN 21 06/15/2017   CO2 30 06/15/2017   TSH 3.08 06/15/2017  INR 2.8 05/25/2017   HGBA1C 6.6 (H) 06/15/2017   MICROALBUR 1.5 10/20/2014    Lab Results  Component Value Date   TSH 3.08 06/15/2017   Lab Results  Component Value Date   WBC 8.8 06/15/2017   HGB 14.9 06/15/2017   HCT 45.6 06/15/2017   MCV 91.4 06/15/2017   PLT 364.0 06/15/2017   Lab Results  Component Value Date   NA 141 06/15/2017   K 4.4 06/15/2017   CHLORIDE 109 08/12/2013    CO2 30 06/15/2017   GLUCOSE 102 (H) 06/15/2017   BUN 21 06/15/2017   CREATININE 0.82 06/15/2017   BILITOT 0.6 06/15/2017   ALKPHOS 68 06/15/2017   AST 17 06/15/2017   ALT 12 06/15/2017   PROT 7.4 06/15/2017   ALBUMIN 3.9 06/15/2017   CALCIUM 9.4 06/15/2017   ANIONGAP 6 08/17/2016   GFR 71.02 06/15/2017   Lab Results  Component Value Date   CHOL 224 (H) 06/15/2017   Lab Results  Component Value Date   HDL 35.60 (L) 06/15/2017   Lab Results  Component Value Date   LDLCALC 134 (H) 03/05/2017   Lab Results  Component Value Date   TRIG 216.0 (H) 06/15/2017   Lab Results  Component Value Date   CHOLHDL 6 06/15/2017   Lab Results  Component Value Date   HGBA1C 6.6 (H) 06/15/2017         Assessment & Plan:   Problem List Items Addressed This Visit    Hyperlipidemia    Encouraged heart healthy diet, increase exercise, avoid trans fats, consider a krill oil cap daily      Relevant Medications   losartan (COZAAR) 100 MG tablet   Other Relevant Orders   Lipid panel (Completed)   Essential hypertension    Well controlled, no changes to meds. Encouraged heart healthy diet such as the DASH diet and exercise as tolerated.       Relevant Medications   losartan (COZAAR) 100 MG tablet   Other Relevant Orders   CBC (Completed)   Comprehensive metabolic panel (Completed)   TSH (Completed)   Paroxysmal supraventricular tachycardia (HCC)    Is tolerating coumadin      Relevant Medications   losartan (COZAAR) 100 MG tablet   Anticoagulant long-term use    Coumadin clinic follows with her      Obesity (BMI 30-39.9)    Encouraged DASH diet, decrease po intake and increase exercise as tolerated. Needs 7-8 hours of sleep nightly. Avoid trans fats, eat small, frequent meals every 4-5 hours with lean proteins, complex carbs and healthy fats. Minimize simple carbs, GMO foods.      Diabetes mellitus type 2 in obese (HCC)    hgba1c acceptable, minimize simple carbs.  Increase exercise as tolerated. Continue current meds      Relevant Medications   losartan (COZAAR) 100 MG tablet   History of UTI    Check urinalysis and culture      RESOLVED: Hyperglycemia    hgba1c acceptable, minimize simple carbs. Increase exercise as tolerated.       Relevant Orders   Hemoglobin A1c (Completed)    Other Visit Diagnoses    High risk medication use    -  Primary   Relevant Orders   Pain Mgmt, Profile 8 w/Conf, U (Completed)      I have discontinued Tayra Dawe. Rohleder's azithromycin. I am also having her maintain her B Complex Vitamins (VITAMIN B COMPLEX PO), potassium chloride SA, glucose monitoring kit, Lancets,  glucose blood, MULTIVITAMIN ADULTS, ONE TOUCH ULTRA 2, furosemide, oseltamivir, fluticasone, HYDROcodone-homatropine, warfarin, losartan, and ALPRAZolam.  Meds ordered this encounter  Medications  . losartan (COZAAR) 100 MG tablet    Sig: Take 1 tablet (100 mg total) by mouth daily.    Dispense:  90 tablet    Refill:  1  . ALPRAZolam (XANAX) 0.25 MG tablet    Sig: TAKE ONE TABLET BY MOUTH TWICE DIALY AS NEEDED FOR ANXIETY    Dispense:  20 tablet    Refill:  1    This request is for a new prescription for a controlled substance as required by Federal/State law.Marland Kitchen    CMA served as Education administrator during this visit. History, Physical and Plan performed by medical provider. Documentation and orders reviewed and attested to.  Penni Homans, MD

## 2017-06-15 NOTE — Assessment & Plan Note (Signed)
Check urinalysis and culture 

## 2017-06-15 NOTE — Assessment & Plan Note (Signed)
Encouraged heart healthy diet, increase exercise, avoid trans fats, consider a krill oil cap daily 

## 2017-06-15 NOTE — Assessment & Plan Note (Signed)
Coumadin clinic follows with her

## 2017-06-15 NOTE — Assessment & Plan Note (Signed)
hgba1c acceptable, minimize simple carbs. Increase exercise as tolerated.  

## 2017-06-15 NOTE — Patient Instructions (Signed)

## 2017-06-15 NOTE — Assessment & Plan Note (Signed)
Well controlled, no changes to meds. Encouraged heart healthy diet such as the DASH diet and exercise as tolerated.  °

## 2017-06-16 LAB — PAIN MGMT, PROFILE 8 W/CONF, U
6 Acetylmorphine: NEGATIVE ng/mL (ref <10)
Alcohol Metabolites: NEGATIVE ng/mL (ref <500)
Amphetamines: NEGATIVE ng/mL (ref <500)
Benzodiazepines: NEGATIVE ng/mL (ref <100)
Buprenorphine, Urine: NEGATIVE ng/mL (ref <5)
Cocaine Metabolite: NEGATIVE ng/mL (ref <150)
Creatinine: 168.5 mg/dL
MDMA: NEGATIVE ng/mL (ref <500)
Marijuana Metabolite: NEGATIVE ng/mL (ref <20)
Opiates: NEGATIVE ng/mL (ref <100)
Oxidant: NEGATIVE ug/mL (ref <200)
Oxycodone: NEGATIVE ng/mL (ref <100)
pH: 6.56 (ref 4.5–9.0)

## 2017-06-17 NOTE — Assessment & Plan Note (Signed)
Encouraged DASH diet, decrease po intake and increase exercise as tolerated. Needs 7-8 hours of sleep nightly. Avoid trans fats, eat small, frequent meals every 4-5 hours with lean proteins, complex carbs and healthy fats. Minimize simple carbs, GMO foods. 

## 2017-06-17 NOTE — Assessment & Plan Note (Signed)
hgba1c acceptable, minimize simple carbs. Increase exercise as tolerated. Continue current meds 

## 2017-06-17 NOTE — Assessment & Plan Note (Signed)
Is tolerating coumadin

## 2017-07-06 ENCOUNTER — Ambulatory Visit (INDEPENDENT_AMBULATORY_CARE_PROVIDER_SITE_OTHER): Payer: Medicare Other | Admitting: General Practice

## 2017-07-06 DIAGNOSIS — Z86718 Personal history of other venous thrombosis and embolism: Secondary | ICD-10-CM

## 2017-07-06 DIAGNOSIS — Z7901 Long term (current) use of anticoagulants: Secondary | ICD-10-CM

## 2017-07-06 LAB — POCT INR: INR: 2.5 (ref 2.0–3.0)

## 2017-07-06 NOTE — Patient Instructions (Addendum)
Pre visit review using our clinic review tool, if applicable. No additional management support is needed unless otherwise documented below in the visit note.  Continue to take 1 tablet daily with 1/2 tablet on Mondays.  Re-check in 6 weeks.  

## 2017-07-31 ENCOUNTER — Ambulatory Visit: Payer: Self-pay | Admitting: Family Medicine

## 2017-07-31 ENCOUNTER — Encounter: Payer: Self-pay | Admitting: Family Medicine

## 2017-07-31 ENCOUNTER — Ambulatory Visit (INDEPENDENT_AMBULATORY_CARE_PROVIDER_SITE_OTHER): Payer: Medicare Other | Admitting: Family Medicine

## 2017-07-31 ENCOUNTER — Other Ambulatory Visit: Payer: Self-pay

## 2017-07-31 VITALS — BP 122/82 | HR 87 | Temp 97.9°F | Ht 63.0 in | Wt 174.2 lb

## 2017-07-31 DIAGNOSIS — Z7901 Long term (current) use of anticoagulants: Secondary | ICD-10-CM | POA: Diagnosis not present

## 2017-07-31 DIAGNOSIS — R310 Gross hematuria: Secondary | ICD-10-CM

## 2017-07-31 DIAGNOSIS — N3091 Cystitis, unspecified with hematuria: Secondary | ICD-10-CM | POA: Diagnosis not present

## 2017-07-31 LAB — POCT URINALYSIS DIPSTICK
Bilirubin, UA: NEGATIVE
Glucose, UA: NEGATIVE
Ketones, UA: NEGATIVE
Nitrite, UA: NEGATIVE
Protein, UA: POSITIVE — AB
Spec Grav, UA: 1.01 (ref 1.010–1.025)
Urobilinogen, UA: 0.2 E.U./dL
pH, UA: 6 (ref 5.0–8.0)

## 2017-07-31 MED ORDER — AMOXICILLIN 875 MG PO TABS: 875.0000 mg | ORAL_TABLET | Freq: Two times a day (BID) | ORAL | 0 refills | Status: DC

## 2017-07-31 NOTE — Patient Instructions (Addendum)
Please follow up if symptoms do not improve or as needed.   We will call you with the results of your culture. Please follow up with Dr. [name] on Friday if you are still seeing blood in your urine.    Urinary Tract Infection, Adult A urinary tract infection (UTI) is an infection of any part of the urinary tract, which includes the kidneys, ureters, bladder, and urethra. These organs make, store, and get rid of urine in the body. UTI can be a bladder infection (cystitis) or kidney infection (pyelonephritis). What are the causes? This infection may be caused by fungi, viruses, or bacteria. Bacteria are the most common cause of UTIs. This condition can also be caused by repeated incomplete emptying of the bladder during urination. What increases the risk? This condition is more likely to develop if:  You ignore your need to urinate or hold urine for long periods of time.  You do not empty your bladder completely during urination.  You wipe back to front after urinating or having a bowel movement, if you are female.  You are uncircumcised, if you are female.  You are constipated.  You have a urinary catheter that stays in place (indwelling).  You have a weak defense (immune) system.  You have a medical condition that affects your bowels, kidneys, or bladder.  You have diabetes.  You take antibiotic medicines frequently or for long periods of time, and the antibiotics no longer work well against certain types of infections (antibiotic resistance).  You take medicines that irritate your urinary tract.  You are exposed to chemicals that irritate your urinary tract.  You are female.  What are the signs or symptoms? Symptoms of this condition include:  Fever.  Frequent urination or passing small amounts of urine frequently.  Needing to urinate urgently.  Pain or burning with urination.  Urine that smells bad or unusual.  Cloudy urine.  Pain in the lower abdomen or  back.  Trouble urinating.  Blood in the urine.  Vomiting or being less hungry than normal.  Diarrhea or abdominal pain.  Vaginal discharge, if you are female.  How is this diagnosed? This condition is diagnosed with a medical history and physical exam. You will also need to provide a urine sample to test your urine. Other tests may be done, including:  Blood tests.  Sexually transmitted disease (STD) testing.  If you have had more than one UTI, a cystoscopy or imaging studies may be done to determine the cause of the infections. How is this treated? Treatment for this condition often includes a combination of two or more of the following:  Antibiotic medicine.  Other medicines to treat less common causes of UTI.  Over-the-counter medicines to treat pain.  Drinking enough water to stay hydrated.  Follow these instructions at home:  Take over-the-counter and prescription medicines only as told by your health care provider.  If you were prescribed an antibiotic, take it as told by your health care provider. Do not stop taking the antibiotic even if you start to feel better.  Avoid alcohol, caffeine, tea, and carbonated beverages. They can irritate your bladder.  Drink enough fluid to keep your urine clear or pale yellow.  Keep all follow-up visits as told by your health care provider. This is important.  Make sure to: ? Empty your bladder often and completely. Do not hold urine for long periods of time. ? Empty your bladder before and after sex. ? Wipe from front to back  after a bowel movement if you are female. Use each tissue one time when you wipe. Contact a health care provider if:  You have back pain.  You have a fever.  You feel nauseous or vomit.  Your symptoms do not get better after 3 days.  Your symptoms go away and then return. Get help right away if:  You have severe back pain or lower abdominal pain.  You are vomiting and cannot keep down any  medicines or water. This information is not intended to replace advice given to you by your health care provider. Make sure you discuss any questions you have with your health care provider. Document Released: 10/26/2004 Document Revised: 06/30/2015 Document Reviewed: 12/07/2014 Elsevier Interactive Patient Education  [employer].

## 2017-07-31 NOTE — Telephone Encounter (Signed)
  Called in c/o having dark red urine.  This started 3 days ago but became more prevalent yesterday (Monday).  It's bloody every time she urinates.   See triage notes.  She normally sees Dr. Charlett Blake at [facility] but all their providers are booked today so flow coordinator suggested she go to another [place] office since pt is refusing to go to the ED.  Pt was very disappointed she could not see Dr. Charlett Blake.  I apologized and worked with trying to get her in at [facility] or [place] at her request when I let her know the situation at [facility].    I made her an appt with Dr. Billey Chang at [facility] office for today at 4:00.   She was agreeable to this plan.   Reason for Disposition . Blood in urine  (Exception: could be normal menstrual bleeding)  Answer Assessment - Initial Assessment Questions 1. COLOR of URINE: "Describe the color of the urine."  (e.g., tea-colored, pink, red, blood clots, bloody)     Sometimes it's bright red other times it's dark. 2. ONSET: "When did the bleeding start?"      Yesterday it became more prevalent.    At least 3 days ago it began. 3. EPISODES: "How many times has there been blood in the urine?" or "How many times today?"     There's blood every time I urinate.   This last time I went it was dark red. 4. PAIN with URINATION: "Is there any pain with passing your urine?" If so, ask: "How bad is the pain?"  (Scale 1-10; or mild, moderate, severe)    - MILD - complains slightly about urination hurting    - MODERATE - interferes with normal activities      - SEVERE - excruciating, unwilling or unable to urinate because of the pain      Not really burning with urination.    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 5. FEVER: "Do you have a fever?" If so, ask: "What is your temperature, how was it measured, and when did it start?"     No 6. ASSOCIATED SYMPTOMS: "Are  you passing urine more frequently than usual?"     Yes.    Once in a while I can feel a twinge of pain but since that nerve was cut I don't really feel pain in my abd. 7. OTHER SYMPTOMS: "Do you have any other symptoms?" (e.g., back/flank pain, abdominal pain, vomiting)     None of the above. 8. PREGNANCY: "Is there any chance you are pregnant?" "When was your last menstrual period?"     Not asked due to age  Protocols used: URINE - BLOOD IN-A-AH

## 2017-07-31 NOTE — Telephone Encounter (Signed)
Provider aware that patient has been added to her schedule.

## 2017-07-31 NOTE — Progress Notes (Signed)
Subjective   CC:  Chief Complaint  Patient presents with  . Hematuria    x 3 days   Same day acute visit; PCP not available. New pt to me. Chart reviewed.   HPI: [name] Little is a 84 y.o. female who presents to the office today to address the problems listed above in the chief complaint.  Patient reports gross hematuria x 3-4 days. Started with light BRB noted on her pad. Progressed to bright red blood in bowl with urination.   She has sensation of increased urinary pressure but no dyuria.  She denies fevers, chills, flank pain nausea vomiting.  She has h/o urosepsis 03/2016 with pansensitive E.coli treated with unasyn.  She denies history of interstitial cystitis.  She denies vaginal symptoms including vaginal discharge or pelvic pain.   She is on coumadin; denies increased bruising, bleeding from gums, melena. INR levels have been stable over last several months. Lab Results  Component Value Date   INR 2.5 07/06/2017   INR 2.8 05/25/2017   INR 3.0 04/20/2017   PROTIME 23.1 07/14/2008   She has normal appetite and energy levels. Says she feels fine.   Assessment  1. Hemorrhagic cystitis   2. Gross hematuria   3. Encounter for current long-term use of anticoagulants      Plan   Acute hemorrhagic cystitis in setting of coumadin: urine culture; start amox; monitor for worsening sxs; discussed in detail with pt. Needs immediate f/u for fever, chills, or if gross hematuria persists.   Follow up: f/u with Dr. [name] if sxs persist.   Orders Placed This Encounter  Procedures  . Urine Culture  . POCT urinalysis dipstick   Meds ordered this encounter  Medications  . amoxicillin (AMOXIL) 875 MG tablet    Sig: Take 1 tablet (875 mg total) by mouth 2 (two) times daily for 7 days.    Dispense:  14 tablet    Refill:  0      I reviewed the patients updated PMH, FH, and SocHx.    Patient Active Problem List   Diagnosis Date Noted  . Mitral valve regurgitation 09/01/2016  .  History of UTI 08/22/2016  . Lower extremity edema 08/22/2016  . BCC (basal cell carcinoma), scalp/neck 09/27/2015  . Pain in limb 05/30/2015  . Low back pain 03/26/2014  . Diabetes mellitus type 2 in obese ([place]) 02/26/2014  . Nocturia 02/26/2014  . Obesity (BMI 30-39.9) 02/03/2014  . Anxiety state 09/22/2013  . FH: factor V Leiden mutation 08/19/2013  . Preventative health care 04/02/2013  . Encounter for therapeutic drug monitoring 03/26/2013  . Anticoagulant long-term use 08/23/2010  . CALLUS, TOE 01/05/2010  . CHEST PAIN 08/23/2009  . hx: breast cancer, IDC, right LOQ, receptor + her 2 - 07/27/2009  . PHLEBITIS&THROMBOPHLEB SUP VESSELS LOWER EXTREM 12/11/2007  . HEMORRHOIDS 12/09/2007  . COLONIC POLYPS, HX OF 12/09/2007  . Essential hypertension 09/18/2007  . Venous (peripheral) insufficiency 07/02/2007  . Hyperlipidemia 06/04/2007  . Paroxysmal supraventricular tachycardia ([place]) 03/28/2007  . Aneurysm of thoracic aorta ([place]) 11/25/2006  . DVT, HX OF 08/11/2006   Current Meds  Medication Sig  . ALPRAZolam (XANAX) 0.25 MG tablet TAKE ONE TABLET BY MOUTH TWICE DIALY AS NEEDED FOR ANXIETY  . B Complex Vitamins (VITAMIN B COMPLEX PO) Take 1 tablet by mouth daily.   . Blood Glucose Monitoring Suppl (ONE TOUCH ULTRA 2) w/Device KIT See admin instructions.  . furosemide (LASIX) 20 MG tablet Take 1 tablet (20  mg total) by mouth daily as needed for fluid or edema.  Marland Kitchen glucose blood (IGLUCOSE TEST STRIPS) test strip Test Blood Sugars every day as needed  . glucose monitoring kit (FREESTYLE) monitoring kit 1 each by Does not apply route as needed for other.  . Lancets MISC Test Blood Sugars every day as needed  . losartan (COZAAR) 100 MG tablet Take 1 tablet (100 mg total) by mouth daily.  . Multiple Vitamins-Minerals (MULTIVITAMIN ADULTS) TABS Take 1 tablet by mouth daily.  . potassium chloride SA (K-DUR,KLOR-CON) 20 MEQ tablet Take 1 tablet (20 mEq total) by mouth daily as needed  (lasix use).  Marland Kitchen warfarin (COUMADIN) 5 MG tablet Take 1 tablet (5 mg) by mouth daily except on Mon Take 0.5 tablet (2.5 mg)  . [DISCONTINUED] fluticasone (FLONASE) 50 MCG/ACT nasal spray Place 2 sprays into both nostrils daily.    Review of Systems: Cardiovascular: negative for chest pain Respiratory: negative for SOB or persistent cough Gastrointestinal: negative for abdominal pain Constitutional: Negative for fever malaise or anorexia  Objective  Vitals: BP 122/82   Pulse 87   Temp 97.9 F (36.6 C)   Ht _0  (1.6 m)   Wt 174 lb 3.2 oz (79 kg)   SpO2 95%   BMI 30.86 kg/m  General: no acute distress  Psych:  Alert and oriented, normal mood and affect Cardiovascular:  RRR without murmur or gallop. no peripheral edema Respiratory:  Good breath sounds bilaterally, CTAB with normal respiratory effort Gastrointestinal: soft, flat abdomen, normal active bowel sounds, no palpable masses, no hepatosplenomegaly, no appreciated hernias, NO CVAT, mild suprapubic ttp w/o rebound or guarding Skin:  Warm, no rashes Neurologic:   Mental status is normal. normal gait Office Visit on 07/31/2017  Component Date Value Ref Range Status  . Color, UA 07/31/2017 amber   Final  . Clarity, UA 07/31/2017 turbid   Final  . Glucose, UA 07/31/2017 Negative  Negative Final  . Bilirubin, UA 07/31/2017 neg   Final  . Ketones, UA 07/31/2017 neg   Final  . Spec Grav, UA 07/31/2017 1.010  1.010 - 1.025 Final  . Blood, UA 07/31/2017 3+   Final  . pH, UA 07/31/2017 6.0  5.0 - 8.0 Final  . Protein, UA 07/31/2017 Positive* Negative Final  . Urobilinogen, UA 07/31/2017 0.2  0.2 or 1.0 E.U./dL Final  . Nitrite, UA 07/31/2017 neg   Final  . Leukocytes, UA 07/31/2017 Small (1+)* Negative Final    Commons side effects, risks, benefits, and alternatives for medications and treatment plan prescribed today were discussed, and the patient expressed understanding of the given instructions. Patient is instructed to call  or message via MyChart if he/she has any questions or concerns regarding our treatment plan. No barriers to understanding were identified. We discussed Red Flag symptoms and signs in detail. Patient expressed understanding regarding what to do in case of urgent or emergency type symptoms.   Medication list was reconciled, printed and provided to the patient in AVS. Patient instructions and summary information was reviewed with the patient as documented in the AVS. This note was prepared with assistance of Dragon voice recognition software. Occasional wrong-word or sound-a-like substitutions may have occurred due to the inherent limitations of voice recognition software

## 2017-08-01 ENCOUNTER — Other Ambulatory Visit: Payer: Self-pay | Admitting: Family Medicine

## 2017-08-01 DIAGNOSIS — Z1231 Encounter for screening mammogram for malignant neoplasm of breast: Secondary | ICD-10-CM

## 2017-08-03 LAB — URINE CULTURE
MICRO NUMBER:: 90788153
SPECIMEN QUALITY:: ADEQUATE

## 2017-08-03 NOTE — Progress Notes (Signed)
Please call patient: I have reviewed his/her lab results. Please let her know that she does have a UTI and the amoxicillin should be relieving her symptoms. She should no longer be seeing blood in her urine. Thanks.

## 2017-08-08 ENCOUNTER — Ambulatory Visit: Payer: Self-pay

## 2017-08-08 NOTE — Telephone Encounter (Signed)
Patient called in with c/o "blood in urine." She says "this has been going on for 10 days. I was treated for a UTI with amoxicillin and the bleeding eased up, but never went completely away. I have burning on the outside when I urinate and the bleeding is every time I urinate. I drink a lot of water, so I am going all the time. I have pain at a 4-5, no fever." I asked about other symptoms, she denies. According to protocol, see PCP within 24 hours, no availability with PCP, she says she only wants to see a doctor, appointment scheduled for tomorrow at 1045 with Dr. Nani Ravens, care advice given, patient verbalized understanding.   Reason for Disposition . Blood in urine  (Exception: could be normal menstrual bleeding)  Answer Assessment - Initial Assessment Questions 1. COLOR of URINE: "Describe the color of the urine."  (e.g., tea-colored, pink, red, blood clots, bloody)     Dark red, no clots 2. ONSET: "When did the bleeding start?"      Over a week, 10 days 3. EPISODES: "How many times has there been blood in the urine?" or "How many times today?"     Each time I urinate 4. PAIN with URINATION: "Is there any pain with passing your urine?" If so, ask: "How bad is the pain?"  (Scale 1-10; or mild, moderate, severe)    - MILD - complains slightly about urination hurting    - MODERATE - interferes with normal activities      - SEVERE - excruciating, unwilling or unable to urinate because of the pain      Mild burning on outside; pain level 4-5 5. FEVER: "Do you have a fever?" If so, ask: "What is your temperature, how was it measured, and when did it start?"     No 6. ASSOCIATED SYMPTOMS: "Are you passing urine more frequently than usual?"     No 7. OTHER SYMPTOMS: "Do you have any other symptoms?" (e.g., back/flank pain, abdominal pain, vomiting)     No 8. PREGNANCY: "Is there any chance you are pregnant?" "When was your last menstrual period?"     No  Protocols used: URINE - BLOOD  IN-A-AH

## 2017-08-09 ENCOUNTER — Encounter: Payer: Self-pay | Admitting: Family Medicine

## 2017-08-09 ENCOUNTER — Ambulatory Visit: Payer: Medicare Other | Admitting: Family Medicine

## 2017-08-09 VITALS — BP 162/90 | HR 66 | Temp 97.9°F | Ht 63.0 in | Wt 174.0 lb

## 2017-08-09 DIAGNOSIS — R310 Gross hematuria: Secondary | ICD-10-CM | POA: Diagnosis not present

## 2017-08-09 LAB — POCT URINALYSIS DIPSTICK
Bilirubin, UA: NEGATIVE
Glucose, UA: NEGATIVE
Ketones, UA: NEGATIVE
Leukocytes, UA: NEGATIVE
Nitrite, UA: NEGATIVE
Protein, UA: POSITIVE — AB
Spec Grav, UA: 1.02 (ref 1.010–1.025)
Urobilinogen, UA: 0.2 E.U./dL
pH, UA: 6 (ref 5.0–8.0)

## 2017-08-09 LAB — URINALYSIS, ROUTINE W REFLEX MICROSCOPIC
Ketones, ur: NEGATIVE
Leukocytes, UA: NEGATIVE
Nitrite: NEGATIVE
Specific Gravity, Urine: 1.02 (ref 1.000–1.030)
Total Protein, Urine: 30 — AB
Urine Glucose: NEGATIVE
Urobilinogen, UA: 0.2 (ref 0.0–1.0)
pH: 6.5 (ref 5.0–8.0)

## 2017-08-09 NOTE — Patient Instructions (Addendum)
If your culture is negative, we will set you up with Dr. Amalia Hailey. If is it positive, we will call in a medicine. This should come back in the next 1-3 days.  If anything changes, let us know or seek care.  Let us know if you need anything.

## 2017-08-09 NOTE — Progress Notes (Signed)
Chief Complaint  Patient presents with  . Follow-up    Pt here to follow up on UTI she was treated for last week. Finished Amoxicillin this past Monday and felt better for 1 day then the next day noticed blood in urine again.     [name] Little is a 84 y.o. female here for possible UTI.  Duration: 10 days. Symptoms: hematuria Denies: urinary frequency, urinary hesitancy, fever, nausea, vomiting and flank pain, vaginal discharge Hx of recurrent UTI? No Denies new sexual partners. Recently just tx'd for UTI with amox, got some improvement. Still having blood. Cx confirmed sensitivity.   ROS:  Constitutional: denies fever GU: As noted in HPI  Past Medical History:  Diagnosis Date  . Anxiety state, unspecified 09/22/2013  . Arthritis    lumbar stenosis, radiculopathy, OA- L hip  . BCC (basal cell carcinoma), scalp/neck 09/27/2015  . Blood clotting disorder (HCC)    V5 factor  . Blood dyscrasia    factor 5 deficiency  . Breast cancer ([place]) 2010   RIGHT breast  . Clostridium difficile infection   . Clotting disorder ([place])   . Diabetes mellitus type 2 in obese ([place]) 02/26/2014  . DVT (deep venous thrombosis) ([place])   . Factor V deficiency ([place])   . Hemorrhoids   . Hyperglycemia 02/26/2014  . Hyperlipemia   . Hypertension   . Infiltrating ductal carcinoma of breast ([place])    stage one right   . Low back pain 03/26/2014  . Macular degeneration   . Mitral valve regurgitation 09/01/2016  . Mixed hyperlipidemia 05/18/2015  . Nocturia 02/26/2014  . Paroxysmal supraventricular tachycardia ([place])   . Personal history of colonic polyps    hyperplastic  . Thoracic aortic aneurysm ([place])   . TMJ pain dysfunction syndrome     BP (!) 162/82 (BP Location: Left Arm, Patient Position: Sitting, Cuff Size: Large)   Pulse 66   Temp 97.9 F (36.6 C) (Oral)   Ht 5\' 3"  (1.6 m)   Wt 174 lb (78.9 kg)   SpO2 97%   BMI 30.82 kg/m  General: Awake, alert, appears stated age Heart: RRR Lungs: CTAB,  normal respiratory effort, no accessory muscle usage Abd: BS+, soft, +TTP in epigastric region, ND, no masses or organomegaly MSK: No CVA tenderness, neg Lloyd's sign Psych: Age appropriate judgment and insight   Gross hematuria - Plan: POCT Urinalysis Dipstick, Urine Culture  Orders as above. If cx neg, will refer to urology. Would like to see Dr. Alona Bene if she needs to see a urologist. Seek immediate care if pt starts to develop fevers, new/worsening symptoms, uncontrollable N/V. F/u prn. The patient voiced understanding and agreement to the plan.  [name], DO 08/09/17 10:59 AM

## 2017-08-10 ENCOUNTER — Other Ambulatory Visit: Payer: Self-pay | Admitting: Family Medicine

## 2017-08-10 DIAGNOSIS — R310 Gross hematuria: Secondary | ICD-10-CM

## 2017-08-10 LAB — URINE CULTURE
MICRO NUMBER:: 90822557
SPECIMEN QUALITY:: ADEQUATE

## 2017-08-17 ENCOUNTER — Encounter: Payer: Self-pay | Admitting: Family Medicine

## 2017-08-17 ENCOUNTER — Ambulatory Visit (HOSPITAL_BASED_OUTPATIENT_CLINIC_OR_DEPARTMENT_OTHER)
Admission: RE | Admit: 2017-08-17 | Discharge: 2017-08-17 | Disposition: A | Discharge status: Home / Self Care | Payer: Medicare Other | Source: Ambulatory Visit | Attending: Family Medicine | Admitting: Family Medicine

## 2017-08-17 ENCOUNTER — Ambulatory Visit: Payer: Self-pay | Admitting: *Deleted

## 2017-08-17 ENCOUNTER — Ambulatory Visit (INDEPENDENT_AMBULATORY_CARE_PROVIDER_SITE_OTHER): Payer: Medicare Other | Admitting: General Practice

## 2017-08-17 ENCOUNTER — Ambulatory Visit: Payer: Medicare Other | Admitting: Family Medicine

## 2017-08-17 VITALS — BP 132/78 | HR 73 | Temp 98.1°F | Resp 16 | Ht 63.0 in | Wt 175.4 lb

## 2017-08-17 DIAGNOSIS — I517 Cardiomegaly: Secondary | ICD-10-CM | POA: Insufficient documentation

## 2017-08-17 DIAGNOSIS — Z9081 Acquired absence of spleen: Secondary | ICD-10-CM | POA: Diagnosis not present

## 2017-08-17 DIAGNOSIS — N200 Calculus of kidney: Secondary | ICD-10-CM | POA: Insufficient documentation

## 2017-08-17 DIAGNOSIS — K429 Umbilical hernia without obstruction or gangrene: Secondary | ICD-10-CM | POA: Diagnosis not present

## 2017-08-17 DIAGNOSIS — R1030 Lower abdominal pain, unspecified: Secondary | ICD-10-CM

## 2017-08-17 DIAGNOSIS — K8020 Calculus of gallbladder without cholecystitis without obstruction: Secondary | ICD-10-CM | POA: Diagnosis not present

## 2017-08-17 DIAGNOSIS — Z7901 Long term (current) use of anticoagulants: Secondary | ICD-10-CM

## 2017-08-17 DIAGNOSIS — I700 Atherosclerosis of aorta: Secondary | ICD-10-CM | POA: Insufficient documentation

## 2017-08-17 DIAGNOSIS — M87852 Other osteonecrosis, left femur: Secondary | ICD-10-CM | POA: Insufficient documentation

## 2017-08-17 DIAGNOSIS — Z86718 Personal history of other venous thrombosis and embolism: Secondary | ICD-10-CM

## 2017-08-17 DIAGNOSIS — R310 Gross hematuria: Secondary | ICD-10-CM | POA: Diagnosis not present

## 2017-08-17 DIAGNOSIS — R300 Dysuria: Secondary | ICD-10-CM | POA: Diagnosis not present

## 2017-08-17 LAB — POC URINALSYSI DIPSTICK (AUTOMATED)
Glucose, UA: NEGATIVE
Ketones, UA: NEGATIVE
Leukocytes, UA: NEGATIVE
Nitrite, UA: NEGATIVE
Protein, UA: POSITIVE — AB
Spec Grav, UA: >=1.03 — AB (ref 1.010–1.025)
Urobilinogen, UA: 1 E.U./dL
pH, UA: 5.5 (ref 5.0–8.0)

## 2017-08-17 LAB — POCT INR: INR: 2.5 (ref 2.0–3.0)

## 2017-08-17 NOTE — Progress Notes (Signed)
Patient ID: [name] Little, female    DOB: [birth year]  Age: 84 y.o. MRN: [id number]    Subjective:  Subjective  HPI FALLYNN GRAVETT presents for dysuria, frequency , gross hematuria since a few days before 7/11 visit.  It has not changed.  Urine culture ----  contaminated  Review of Systems  Constitutional: Negative for chills and fever.  HENT: Negative for congestion and hearing loss.   Eyes: Negative for discharge.  Respiratory: Negative for cough and shortness of breath.   Cardiovascular: Negative for chest pain, palpitations and leg swelling.  Gastrointestinal: Negative for abdominal pain, blood in stool, constipation, diarrhea, nausea and vomiting.  Genitourinary: Positive for dysuria, frequency and hematuria. Negative for urgency.  Musculoskeletal: Negative for back pain and myalgias.  Skin: Negative for rash.  Allergic/Immunologic: Negative for environmental allergies.  Neurological: Negative for dizziness, weakness and headaches.  Hematological: Does not bruise/bleed easily.  Psychiatric/Behavioral: Negative for suicidal ideas. The patient is not nervous/anxious.     History Past Medical History:  Diagnosis Date  . Anxiety state, unspecified 09/22/2013  . Arthritis    lumbar stenosis, radiculopathy, OA- L hip  . BCC (basal cell carcinoma), scalp/neck 09/27/2015  . Blood clotting disorder (HCC)    V5 factor  . Blood dyscrasia    factor 5 deficiency  . Breast cancer ([place]) 2010   RIGHT breast  . Clostridium difficile infection   . Clotting disorder ([place])   . Diabetes mellitus type 2 in obese ([place]) 02/26/2014  . DVT (deep venous thrombosis) ([place])   . Factor V deficiency ([place])   . Hemorrhoids   . Hyperglycemia 02/26/2014  . Hyperlipemia   . Hypertension   . Infiltrating ductal carcinoma of breast ([place])    stage one right   . Low back pain 03/26/2014  . Macular degeneration   . Mitral valve regurgitation 09/01/2016  . Mixed hyperlipidemia 05/18/2015  . Nocturia 02/26/2014    . Paroxysmal supraventricular tachycardia ([place])   . Personal history of colonic polyps    hyperplastic  . Thoracic aortic aneurysm ([place])   . TMJ pain dysfunction syndrome     She has a past surgical history that includes Total hip arthroplasty (1999); Laparoscopic hysterectomy; Splenectomy; right calf surgery; Colonoscopy; Upper gastrointestinal endoscopy; Joint replacement; Abdominal hysterectomy; Eye surgery; Lumbar laminectomy/decompression microdiscectomy (04/11/2011); and Breast lumpectomy (2010).   Her family history includes Alzheimer's disease in her sister; Breast cancer in her paternal aunt; Cancer in her paternal aunt; Clotting disorder in her maternal uncle; Heart disease in her father, mother, and unknown relative.She reports that she has never smoked. She has never used smokeless tobacco. She reports that she does not drink alcohol or use drugs.  Current Outpatient Medications on File Prior to Visit  Medication Sig Dispense Refill  . ALPRAZolam (XANAX) 0.25 MG tablet TAKE ONE TABLET BY MOUTH TWICE DIALY AS NEEDED FOR ANXIETY 20 tablet 1  . B Complex Vitamins (VITAMIN B COMPLEX PO) Take 1 tablet by mouth daily.     . Blood Glucose Monitoring Suppl (ONE TOUCH ULTRA 2) w/Device KIT See admin instructions.  0  . furosemide (LASIX) 20 MG tablet Take 1 tablet (20 mg total) by mouth daily as needed for fluid or edema. 30 tablet 1  . glucose blood (IGLUCOSE TEST STRIPS) test strip Test Blood Sugars every day as needed 100 each 5  . glucose monitoring kit (FREESTYLE) monitoring kit 1 each by Does not apply route as needed for other. 1 each 0  .  Lancets MISC Test Blood Sugars every day as needed 100 each 5  . losartan (COZAAR) 100 MG tablet Take 1 tablet (100 mg total) by mouth daily. 90 tablet 1  . Multiple Vitamins-Minerals (MULTIVITAMIN ADULTS) TABS Take 1 tablet by mouth daily.    . potassium chloride SA (K-DUR,KLOR-CON) 20 MEQ tablet Take 1 tablet (20 mEq total) by mouth daily as  needed (lasix use). 30 tablet 0  . warfarin (COUMADIN) 5 MG tablet Take 1 tablet (5 mg) by mouth daily except on Mon Take 0.5 tablet (2.5 mg) 90 tablet 3   No current facility-administered medications on file prior to visit.      Objective:  Objective  Physical Exam  Constitutional: She is oriented to person, place, and time. She appears well-developed and well-nourished.  HENT:  Head: Normocephalic and atraumatic.  Eyes: Conjunctivae and EOM are normal.  Neck: Normal range of motion. Neck supple. No JVD present. Carotid bruit is not present. No thyromegaly present.  Cardiovascular: Normal rate, regular rhythm and normal heart sounds.  No murmur heard. Pulmonary/Chest: Effort normal and breath sounds normal. No respiratory distress. She has no wheezes. She has no rales. She exhibits no tenderness.  Abdominal: She exhibits no mass. There is tenderness. There is no rebound and no guarding. No hernia.  Musculoskeletal: She exhibits no edema.  Neurological: She is alert and oriented to person, place, and time.  Psychiatric: She has a normal mood and affect.  Nursing note and vitals reviewed.  BP 132/78 (BP Location: Right Arm, Cuff Size: Large)   Pulse 73   Temp 98.1 F (36.7 C) (Oral)   Resp 16   Ht _0  (1.6 m)   Wt 175 lb 6.4 oz (79.6 kg)   SpO2 95%   BMI 31.07 kg/m  Wt Readings from Last 3 Encounters:  08/17/17 175 lb 6.4 oz (79.6 kg)  08/09/17 174 lb (78.9 kg)  07/31/17 174 lb 3.2 oz (79 kg)     Lab Results  Component Value Date   WBC 8.8 06/15/2017   HGB 14.9 06/15/2017   HCT 45.6 06/15/2017   PLT 364.0 06/15/2017   GLUCOSE 102 (H) 06/15/2017   CHOL 224 (H) 06/15/2017   TRIG 216.0 (H) 06/15/2017   HDL 35.60 (L) 06/15/2017   LDLDIRECT 145.0 06/15/2017   LDLCALC 134 (H) 03/05/2017   ALT 12 06/15/2017   AST 17 06/15/2017   NA 141 06/15/2017   K 4.4 06/15/2017   CL 105 06/15/2017   CREATININE 0.82 06/15/2017   BUN 21 06/15/2017   CO2 30 06/15/2017   TSH 3.08  06/15/2017   INR 2.5 08/17/2017   HGBA1C 6.6 (H) 06/15/2017   MICROALBUR 1.5 10/20/2014    Ct Angio Chest Aorta W/cm &/or Wo/cm  Result Date: 11/29/2016 CLINICAL DATA:  Follow-up thoracic aortic aneurysm EXAM: CT ANGIOGRAPHY CHEST WITH CONTRAST TECHNIQUE: Multidetector CT imaging of the chest was performed using the standard protocol during bolus administration of intravenous contrast. Multiplanar CT image reconstructions and MIPs were obtained to evaluate the vascular anatomy. CONTRAST:  100 cc Isovue 370 IV COMPARISON:  07/25/2005 FINDINGS: Cardiovascular: No evidence of thoracic aortic aneurysm. Maximum aortic diameter in the ascending thoracic aorta 3.7 cm. Mild atherosclerotic calcifications in the arch and descending thoracic aorta. Irregular plaque in the distal descending thoracic aorta and proximal abdominal aorta. Heart is mildly enlarged. Mediastinum/Nodes: No mediastinal, hilar, or axillary adenopathy. Trachea and esophagus are unremarkable. Lungs/Pleura: Linear densities in the lingula and right middle lobe, likely scarring. No  confluent opacities otherwise. No effusions. Upper Abdomen: Imaging into the upper abdomen shows no acute findings. Musculoskeletal: Chest wall soft tissues are unremarkable. No acute bony abnormality. Review of the MIP images confirms the above findings. IMPRESSION: No evidence of thoracic aortic aneurysm, with maximum diameter in the ascending thoracic aorta at 3.7 cm. Irregular plaque in the distal descending thoracic aorta and upper abdominal aorta. Borderline heart size. No acute cardiopulmonary disease. Aortic Atherosclerosis (ICD10-I70.0). Electronically Signed   By: Rolm Baptise M.D.   On: 11/29/2016 10:24     Assessment & Plan:  Plan  I am having Rochelle Larue. Schul maintain her B Complex Vitamins (VITAMIN B COMPLEX PO), potassium chloride SA, glucose monitoring kit, Lancets, glucose blood, MULTIVITAMIN ADULTS, ONE TOUCH ULTRA 2, furosemide, warfarin, losartan,  and ALPRAZolam.  No orders of the defined types were placed in this encounter.   Problem List Items Addressed This Visit    None    Visit Diagnoses    Dysuria    -  Primary   Relevant Orders   POCT Urinalysis Dipstick (Automated) (Completed)   Urine Culture   Gross hematuria       Relevant Orders   Urine Culture   CT RENAL STONE STUDY   Lower abdominal pain       Relevant Orders   CBC w/Diff   Comp Met (CMET)    ct renal studay--- will try to get appt sooner with uro If symptoms worsen -- go to er  Urine cultre done --- sterile   Follow-up: Return if symptoms worsen or fail to improve.  Ann Held, DO

## 2017-08-17 NOTE — Patient Instructions (Signed)

## 2017-08-17 NOTE — Telephone Encounter (Signed)
Pt called with having bloody urine. She is on a blood thinner (coumadin). This has been going on for a while,. She was seen in the office  on July 2nd for hematuria. She had been on an antibiotic and finished up on the July 8th. She was seen again on July 11 th. She states her temp is around 97 but normally 98. She states that she can not feel pain below her belly button and denies flank pain. Appointment scheduled for today Will route to flow at [facility].  Advised to call back for worsening symptoms, pt voiced understanding. Reason for Disposition . Blood in urine  (Exception: could be normal menstrual bleeding)  Answer Assessment - Initial Assessment Questions 1. COLOR of URINE: "Describe the color of the urine."  (e.g., tea-colored, pink, red, blood clots, bloody)     Muddy looking 2. ONSET: "When did the bleeding start?"      Since the uti in July 11th 3. EPISODES: "How many times has there been blood in the urine?" or "How many times today?"     Looks like that some times 4. PAIN with URINATION: "Is there any pain with passing your urine?" If so, ask: "How bad is the pain?"  (Scale 1-10; or mild, moderate, severe)    - MILD - complains slightly about urination hurting    - MODERATE - interferes with normal activities      - SEVERE - excruciating, unwilling or unable to urinate because of the pain      Can not feel pain across towards the lower abd 5. FEVER: "Do you have a fever?" If so, ask: "What is your temperature, how was it measured, and when did it start?"     no 6. ASSOCIATED SYMPTOMS: "Are you passing urine more frequently than usual?"     no 7. OTHER SYMPTOMS: "Do you have any other symptoms?" (e.g., back/flank pain, abdominal pain, vomiting)     no  Protocols used: URINE - BLOOD IN-A-AH

## 2017-08-17 NOTE — Patient Instructions (Addendum)
Pre visit review using our clinic review tool, if applicable. No additional management support is needed unless otherwise documented below in the visit note.  Continue to take 1 tablet daily with 1/2 tablet on Mondays.  Re-check in 6 weeks.  

## 2017-08-18 ENCOUNTER — Encounter: Payer: Self-pay | Admitting: Family Medicine

## 2017-08-18 DIAGNOSIS — I700 Atherosclerosis of aorta: Secondary | ICD-10-CM | POA: Insufficient documentation

## 2017-08-18 LAB — COMPREHENSIVE METABOLIC PANEL
AG Ratio: 1.4 (calc) (ref 1.0–2.5)
ALT: 9 U/L (ref 6–29)
AST: 18 U/L (ref 10–35)
Albumin: 4 g/dL (ref 3.6–5.1)
Alkaline phosphatase (APISO): 68 U/L (ref 33–130)
BUN: 22 mg/dL (ref 7–25)
CO2: 27 mmol/L (ref 20–32)
Calcium: 9.1 mg/dL (ref 8.6–10.4)
Chloride: 104 mmol/L (ref 98–110)
Creat: 0.85 mg/dL (ref 0.60–0.88)
Globulin: 2.9 g/dL (calc) (ref 1.9–3.7)
Glucose, Bld: 100 mg/dL — ABNORMAL HIGH (ref 65–99)
Potassium: 4.7 mmol/L (ref 3.5–5.3)
Sodium: 141 mmol/L (ref 135–146)
Total Bilirubin: 0.6 mg/dL (ref 0.2–1.2)
Total Protein: 6.9 g/dL (ref 6.1–8.1)

## 2017-08-18 LAB — CBC WITH DIFFERENTIAL/PLATELET
Basophils Absolute: 109 cells/uL (ref 0–200)
Basophils Relative: 1 %
Eosinophils Absolute: 316 cells/uL (ref 15–500)
Eosinophils Relative: 2.9 %
HCT: 40 % (ref 35.0–45.0)
Hemoglobin: 13.9 g/dL (ref 11.7–15.5)
Lymphs Abs: 5472 cells/uL — ABNORMAL HIGH (ref 850–3900)
MCH: 30.2 pg (ref 27.0–33.0)
MCHC: 34.8 g/dL (ref 32.0–36.0)
MCV: 86.8 fL (ref 80.0–100.0)
MPV: 11.6 fL (ref 7.5–12.5)
Monocytes Relative: 11.8 %
Neutro Abs: 3717 cells/uL (ref 1500–7800)
Neutrophils Relative %: 34.1 %
Platelets: 344 10*3/uL (ref 140–400)
RBC: 4.61 10*6/uL (ref 3.80–5.10)
RDW: 14.2 % (ref 11.0–15.0)
Total Lymphocyte: 50.2 %
WBC mixed population: 1286 cells/uL — ABNORMAL HIGH (ref 200–950)
WBC: 10.9 10*3/uL — ABNORMAL HIGH (ref 3.8–10.8)

## 2017-08-20 ENCOUNTER — Other Ambulatory Visit: Payer: Self-pay

## 2017-08-20 LAB — URINE CULTURE
MICRO NUMBER:: 90857859
SPECIMEN QUALITY:: ADEQUATE

## 2017-08-20 MED ORDER — CIPROFLOXACIN HCL 250 MG PO TABS: 250.0000 mg | ORAL_TABLET | Freq: Two times a day (BID) | ORAL | 0 refills | Status: AC

## 2017-08-29 ENCOUNTER — Ambulatory Visit
Admission: RE | Admit: 2017-08-29 | Discharge: 2017-08-29 | Disposition: A | Discharge status: Home / Self Care | Payer: Medicare Other | Source: Ambulatory Visit | Attending: Family Medicine | Admitting: Family Medicine

## 2017-08-29 DIAGNOSIS — Z1231 Encounter for screening mammogram for malignant neoplasm of breast: Secondary | ICD-10-CM

## 2017-09-05 NOTE — Progress Notes (Signed)
Cardiology Office Note   Date:  09/06/2017   ID:  [name] Little, DOB 11-01-1933, MRN [id number]  PCP:  Mosie Lukes, MD  Cardiologist:   Jenkins Rouge, MD   No chief complaint on file.     History of Present Illness:  84 y.o. first seen August 2018 for murmur and mitral valve disease.   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 CRF;s HTN, elevated lipids and DM.  Intolerant to statins with lipitor and pravachol causing weakness. Echo done 08/24/16 for edema reviewed She has no history of chest pain palpitations PND/Orhtopnea rheumatic fever or SBE  Study Conclusions  - Left ventricle: The cavity size was normal. There was mild   concentric hypertrophy. Systolic function was normal. The   estimated ejection fraction was in the range of 55% to 60%. Wall   motion was normal; there were no regional wall motion   abnormalities. - Aortic root: The aortic root was mildly dilated. - Mitral valve: There was mild to moderate regurgitation. - Left atrium: The atrium was mildly dilated.   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 Rx INR Rx at 2.5 Resolved  BP been running high Takes Cozaar in am and lasix latter in day     Past Medical History:  Diagnosis Date  . Anxiety state, unspecified 09/22/2013  . Arthritis    lumbar stenosis, radiculopathy, OA- L hip  . BCC (basal cell carcinoma), scalp/neck 09/27/2015  . Blood clotting disorder (HCC)    V5 factor  . Blood dyscrasia    factor 5 deficiency  . Breast cancer ([place]) 2010   RIGHT breast  . Clostridium difficile infection   . Clotting disorder ([place])   . Diabetes  mellitus type 2 in obese ([place]) 02/26/2014  . DVT (deep venous thrombosis) ([place])   . Factor V deficiency ([place])   . Hemorrhoids   . Hyperglycemia 02/26/2014  . Hyperlipemia   . Hypertension   . Infiltrating ductal carcinoma of breast ([place])    stage one right   . Low back pain 03/26/2014  . Macular degeneration   . Mitral valve regurgitation 09/01/2016  . Mixed hyperlipidemia 05/18/2015  . Nocturia 02/26/2014  . Paroxysmal supraventricular tachycardia ([place])   . Personal history of colonic polyps    hyperplastic  . Thoracic aortic aneurysm ([place])   . TMJ pain dysfunction syndrome     Past Surgical History:  Procedure Laterality Date  . ABDOMINAL HYSTERECTOMY     1960- ? vaginal hysterectomy, not laparoscopic   . BREAST LUMPECTOMY  2010   right Dr. [name]  . COLONOSCOPY    . EYE SURGERY     bilateral cataracts- /w IOL   . JOINT REPLACEMENT     R hip replacement   . LAPAROSCOPIC HYSTERECTOMY    . LUMBAR LAMINECTOMY/DECOMPRESSION MICRODISCECTOMY  04/11/2011   Procedure: LUMBAR LAMINECTOMY/DECOMPRESSION MICRODISCECTOMY;  Surgeon: [name], MD;  Location: [facility];  Service: Neurosurgery;  Laterality: N/A;  Lumbar Five-Sacral One Microdiscectomy  . right calf surgery     for cancer  . SPLENECTOMY    .  TOTAL HIP ARTHROPLASTY  1999   right  . UPPER GASTROINTESTINAL ENDOSCOPY       Current Outpatient Medications  Medication Sig Dispense Refill  . ALPRAZolam (XANAX) 0.25 MG tablet TAKE ONE TABLET BY MOUTH TWICE DIALY AS NEEDED FOR ANXIETY 20 tablet 1  . B Complex Vitamins (VITAMIN B COMPLEX PO) Take 1 tablet by mouth daily.     . Blood Glucose Monitoring Suppl (ONE TOUCH ULTRA 2) w/Device KIT See admin instructions.  0  . furosemide (LASIX) 20 MG tablet Take 1 tablet (20 mg total) by mouth daily as needed for fluid or edema. 30 tablet 1  . glucose blood (IGLUCOSE TEST STRIPS) test strip Test Blood Sugars every day as needed 100 each 5  . glucose monitoring kit (FREESTYLE)  monitoring kit 1 each by Does not apply route as needed for other. 1 each 0  . Lancets MISC Test Blood Sugars every day as needed 100 each 5  . losartan (COZAAR) 100 MG tablet Take 1 tablet (100 mg total) by mouth daily. 90 tablet 1  . Multiple Vitamins-Minerals (MULTIVITAMIN ADULTS) TABS Take 1 tablet by mouth daily.    . potassium chloride SA (K-DUR,KLOR-CON) 20 MEQ tablet Take 1 tablet (20 mEq total) by mouth daily as needed (lasix use). 30 tablet 0  . warfarin (COUMADIN) 5 MG tablet Take 1 tablet (5 mg) by mouth daily except on Mon Take 0.5 tablet (2.5 mg) 90 tablet 3   No current facility-administered medications for this visit.     Allergies:   Amlodipine; Carvedilol; Cefuroxime axetil; Chlorthalidone; Statins; and Sulfonamide derivatives    Social History:  The patient  reports that she has never smoked. She has never used smokeless tobacco. She reports that she does not drink alcohol or use drugs.   Family History:  The patient's family history includes Alzheimer's disease in her sister; Breast cancer in her paternal aunt; Cancer in her paternal aunt; Clotting disorder in her maternal uncle; Heart disease in her father, mother, and unknown relative.    ROS:  Please see the history of present illness.   Otherwise, review of systems are positive for none.   All other systems are reviewed and negative.    PHYSICAL EXAM: VS:  BP (!) 162/88   Pulse 86   Ht '5\' 3"'  (1.6 m)   Wt 173 lb 3.2 oz (78.6 kg)   SpO2 95%   BMI 30.68 kg/m  , BMI Body mass index is 30.68 kg/m. Affect appropriate Healthy:  appears stated age 63: normal Neck supple with no adenopathy JVP normal no bruits no thyromegaly Lungs clear with no wheezing and good diaphragmatic motion Heart:  S1/S2 soft apical MR murmur, no rub, gallop or click PMI normal Abdomen: benighn, BS positve, no tenderness, no AAA no bruit.  No HSM or HJR Distal pulses intact with no bruits Plus one bilateral ankle  edema Neuro  non-focal Skin warm and dry No muscular weakness    EKG:  09/06/17 SR rate 71 LAD otherwise normal    Recent Labs: 06/15/2017: TSH 3.08 08/17/2017: ALT 9; BUN 22; Creat 0.85; Hemoglobin 13.9; Platelets 344; Potassium 4.7; Sodium 141    Lipid Panel    Component Value Date/Time   CHOL 224 (H) 06/15/2017 0820   TRIG 216.0 (H) 06/15/2017 0820   HDL 35.60 (L) 06/15/2017 0820   CHOLHDL 6 06/15/2017 0820   VLDL 43.2 (H) 06/15/2017 0820   LDLCALC 134 (H) 03/05/2017 0943   LDLDIRECT 145.0 06/15/2017 0820  Wt Readings from Last 3 Encounters:  09/06/17 173 lb 3.2 oz (78.6 kg)  08/17/17 175 lb 6.4 oz (79.6 kg)  08/09/17 174 lb (78.9 kg)      Other studies Reviewed: Additional studies/ records that were reviewed today include: Notes Dr [name] Echo done July 2018 ECG's .    ASSESSMENT AND PLAN:  1. MR- mild to moderate not clinically significant no need for SBE f/u in a year 2. HTN has f/u with primary next week would consider adding hydralazine 25 bid or cardizem 240 mg daily  3. DM:  Discussed low carb diet.  Target hemoglobin A1c is 6.5 or less.  Continue current medications. 4. Cholesterol continue welchol  intolerant to statins  5. Hypercoagulability: on coumadin followed by primary  6. Anxiety:  Seems stable  7. Edema: dependant from venous disease cut out salt in chicken tenders PRN Lasix    Current medicines are reviewed at length with the patient today.  The patient does not have concerns regarding medicines.  The following changes have been made:  no change  Labs/ tests ordered today include: None No orders of the defined types were placed in this encounter.    Disposition:   FU with me in a year      Signed, Jenkins Rouge, MD  09/06/2017 10:31 AM    [facility] [address], [place], [place]  [address] Phone: [phone]; Fax: [phone]

## 2017-09-06 ENCOUNTER — Ambulatory Visit: Payer: Medicare Other | Admitting: Cardiovascular Disease

## 2017-09-06 ENCOUNTER — Encounter: Payer: Self-pay | Admitting: Cardiovascular Disease

## 2017-09-06 VITALS — BP 162/88 | HR 86 | Ht 63.0 in | Wt 173.2 lb

## 2017-09-06 DIAGNOSIS — I10 Essential (primary) hypertension: Secondary | ICD-10-CM

## 2017-09-06 DIAGNOSIS — I340 Nonrheumatic mitral (valve) insufficiency: Secondary | ICD-10-CM | POA: Diagnosis not present

## 2017-09-06 NOTE — Patient Instructions (Addendum)

## 2017-09-10 NOTE — Addendum Note (Signed)
Addended by: Joaquim Lai on: 09/10/2017 07:59 AM   Modules accepted: Orders

## 2017-09-17 ENCOUNTER — Ambulatory Visit: Payer: Medicare Other | Admitting: Family Medicine

## 2017-09-17 ENCOUNTER — Encounter: Payer: Self-pay | Admitting: Family Medicine

## 2017-09-17 VITALS — BP 162/82 | HR 78 | Temp 97.8°F | Resp 18 | Ht 63.0 in | Wt 173.8 lb

## 2017-09-17 DIAGNOSIS — I10 Essential (primary) hypertension: Secondary | ICD-10-CM

## 2017-09-17 DIAGNOSIS — E1169 Type 2 diabetes mellitus with other specified complication: Secondary | ICD-10-CM | POA: Diagnosis not present

## 2017-09-17 DIAGNOSIS — R319 Hematuria, unspecified: Secondary | ICD-10-CM | POA: Diagnosis not present

## 2017-09-17 DIAGNOSIS — E782 Mixed hyperlipidemia: Secondary | ICD-10-CM | POA: Diagnosis not present

## 2017-09-17 DIAGNOSIS — N200 Calculus of kidney: Secondary | ICD-10-CM

## 2017-09-17 DIAGNOSIS — E669 Obesity, unspecified: Secondary | ICD-10-CM | POA: Diagnosis not present

## 2017-09-17 LAB — COMPREHENSIVE METABOLIC PANEL
ALT: 12 U/L (ref 0–35)
AST: 18 U/L (ref 0–37)
Albumin: 4 g/dL (ref 3.5–5.2)
Alkaline Phosphatase: 75 U/L (ref 39–117)
BUN: 17 mg/dL (ref 6–23)
CO2: 28 mEq/L (ref 19–32)
Calcium: 9.7 mg/dL (ref 8.4–10.5)
Chloride: 107 mEq/L (ref 96–112)
Creatinine, Ser: 0.9 mg/dL (ref 0.40–1.20)
GFR: 63.75 mL/min (ref >60.00)
Glucose, Bld: 117 mg/dL — ABNORMAL HIGH (ref 70–99)
Potassium: 4.7 mEq/L (ref 3.5–5.1)
Sodium: 143 mEq/L (ref 135–145)
Total Bilirubin: 0.5 mg/dL (ref 0.2–1.2)
Total Protein: 7.2 g/dL (ref 6.0–8.3)

## 2017-09-17 LAB — URINALYSIS, ROUTINE W REFLEX MICROSCOPIC
Leukocytes, UA: NEGATIVE
Nitrite: NEGATIVE
Specific Gravity, Urine: >=1.03 — AB (ref 1.000–1.030)
Urine Glucose: NEGATIVE
Urobilinogen, UA: 0.2 (ref 0.0–1.0)
pH: 5 (ref 5.0–8.0)

## 2017-09-17 LAB — CBC
HCT: 44.4 % (ref 36.0–46.0)
Hemoglobin: 14.4 g/dL (ref 12.0–15.0)
MCHC: 32.4 g/dL (ref 30.0–36.0)
MCV: 91.4 fl (ref 78.0–100.0)
Platelets: 312 10*3/uL (ref 150.0–400.0)
RBC: 4.86 Mil/uL (ref 3.87–5.11)
RDW: 14.8 % (ref 11.5–15.5)
WBC: 8.6 10*3/uL (ref 4.0–10.5)

## 2017-09-17 LAB — LIPID PANEL
Cholesterol: 219 mg/dL — ABNORMAL HIGH (ref 0–200)
HDL: 36.4 mg/dL — ABNORMAL LOW (ref >39.00)
NonHDL: 182.23
Total CHOL/HDL Ratio: 6
Triglycerides: 315 mg/dL — ABNORMAL HIGH (ref 0.0–149.0)
VLDL: 63 mg/dL — ABNORMAL HIGH (ref 0.0–40.0)

## 2017-09-17 LAB — HEMOGLOBIN A1C: Hgb A1c MFr Bld: 6.5 % (ref 4.6–6.5)

## 2017-09-17 LAB — LDL CHOLESTEROL, DIRECT: Direct LDL: 121 mg/dL

## 2017-09-17 LAB — TSH: TSH: 4.4 u[IU]/mL (ref 0.35–4.50)

## 2017-09-17 MED ORDER — LOSARTAN POTASSIUM 100 MG PO TABS: 100.0000 mg | ORAL_TABLET | Freq: Every day | ORAL | 1 refills | Status: DC

## 2017-09-17 MED ORDER — HYDRALAZINE HCL 25 MG PO TABS: 25.0000 mg | ORAL_TABLET | Freq: Two times a day (BID) | ORAL | 3 refills | Status: DC

## 2017-09-17 NOTE — Patient Instructions (Signed)

## 2017-09-17 NOTE — Assessment & Plan Note (Signed)
Encouraged heart healthy diet, increase exercise, avoid trans fats, consider a krill oil cap daily 

## 2017-09-17 NOTE — Assessment & Plan Note (Signed)
hgba1c acceptable, minimize simple carbs. Increase exercise as tolerated.  

## 2017-09-17 NOTE — Assessment & Plan Note (Signed)
Sees some blood with urination but no dysuria. Encouraged to stay hydrated with lemon. Has an appt with [facility] Dr Amalia Hailey on 8/29

## 2017-09-17 NOTE — Assessment & Plan Note (Addendum)
Poorly controlled will alter medications, encouraged DASH diet, minimize caffeine and obtain adequate sleep. Report concerning symptoms and follow up as directed and as needed. Reviewed cardiology note and will start Hydralazine 25 mg po bid per their recommendation and see patient in follow up next month

## 2017-09-17 NOTE — Progress Notes (Signed)
Subjective:  I acted as a Education administrator for Dr. Charlett Blake. [place], [place]  Patient ID: [name] Little, female    DOB: 1933-04-30, 84 y.o.   MRN: [id number]  No chief complaint on file.   HPI  Patient is in today for a 3 month follow up. She is following up on her HTN, DM and other medical conditions. She has abeen diagnosed with kidney stones and is seeing Dr Amalia Hailey of urology later this month. Notes some heamturia when she works hard physically but no dysuria, fevers, flank pain etc. She has seen cardiology and at home and there she has noted BP numbers similar to here. Denies CP/palp/SOB/HA/congestion/fevers/GI or GU c/o. Taking meds as prescribed  Patient Care Team: Mosie Lukes, MD as PCP - General (Family Medicine) Zadie Rhine Clent Demark, MD as Consulting Physician (Ophthalmology)   Past Medical History:  Diagnosis Date  . Anxiety state, unspecified 09/22/2013  . Arthritis    lumbar stenosis, radiculopathy, OA- L hip  . BCC (basal cell carcinoma), scalp/neck 09/27/2015  . Blood clotting disorder (HCC)    V5 factor  . Blood dyscrasia    factor 5 deficiency  . Breast cancer ([place]) 2010   RIGHT breast  . Clostridium difficile infection   . Clotting disorder ([place])   . Diabetes mellitus type 2 in obese ([place]) 02/26/2014  . DVT (deep venous thrombosis) ([place])   . Factor V deficiency ([place])   . Hemorrhoids   . Hyperglycemia 02/26/2014  . Hyperlipemia   . Hypertension   . Infiltrating ductal carcinoma of breast ([place])    stage one right   . Low back pain 03/26/2014  . Macular degeneration   . Mitral valve regurgitation 09/01/2016  . Mixed hyperlipidemia 05/18/2015  . Nocturia 02/26/2014  . Paroxysmal supraventricular tachycardia ([place])   . Personal history of colonic polyps    hyperplastic  . Thoracic aortic aneurysm ([place])   . TMJ pain dysfunction syndrome     Past Surgical History:  Procedure Laterality Date  . ABDOMINAL HYSTERECTOMY     1960- ? vaginal hysterectomy, not laparoscopic   . BREAST  LUMPECTOMY  2010   right Dr. [name]  . COLONOSCOPY    . EYE SURGERY     bilateral cataracts- /w IOL   . JOINT REPLACEMENT     R hip replacement   . LAPAROSCOPIC HYSTERECTOMY    . LUMBAR LAMINECTOMY/DECOMPRESSION MICRODISCECTOMY  04/11/2011   Procedure: LUMBAR LAMINECTOMY/DECOMPRESSION MICRODISCECTOMY;  Surgeon: [name], MD;  Location: [facility];  Service: Neurosurgery;  Laterality: N/A;  Lumbar Five-Sacral One Microdiscectomy  . right calf surgery     for cancer  . SPLENECTOMY    . TOTAL HIP ARTHROPLASTY  1999   right  . UPPER GASTROINTESTINAL ENDOSCOPY      Family History  Problem Relation Age of Onset  . Heart disease Mother   . Heart disease Father   . Alzheimer's disease Sister   . Heart disease Unknown        uncles  . Clotting disorder Maternal Uncle   . Breast cancer Paternal Aunt   . Cancer Paternal Aunt        breast  . Colon cancer Neg Hx   . Anesthesia problems Neg Hx   . Hypotension Neg Hx   . Malignant hyperthermia Neg Hx   . Pseudochol deficiency Neg Hx     Social History   Socioeconomic History  . Marital status: Married    Spouse name: Not on file  .  Number of children: 4  . Years of education: Not on file  . Highest education level: Not on file  Occupational History  . Occupation: Retired  Scientific laboratory technician  . Financial resource strain: Not on file  . Food insecurity:    Worry: Not on file    Inability: Not on file  . Transportation needs:    Medical: Not on file    Non-medical: Not on file  Tobacco Use  . Smoking status: Never Smoker  . Smokeless tobacco: Never Used  Substance and Sexual Activity  . Alcohol use: No    Alcohol/week: 0.0 standard drinks  . Drug use: No  . Sexual activity: Never    Partners: Male  Lifestyle  . Physical activity:    Days per week: Not on file    Minutes per session: Not on file  . Stress: Not on file  Relationships  . Social connections:    Talks on phone: Not on file    Gets together: Not on file     Attends religious service: Not on file    Active member of club or organization: Not on file    Attends meetings of clubs or organizations: Not on file    Relationship status: Not on file  . Intimate partner violence:    Fear of current or ex partner: Not on file    Emotionally abused: Not on file    Physically abused: Not on file    Forced sexual activity: Not on file  Other Topics Concern  . Not on file  Social History Narrative   Married 1955   2 sons - '59, '61, 2 daughters '55, '57; 8 grandchildren; 1 great grand   I-ADLs       Outpatient Medications Prior to Visit  Medication Sig Dispense Refill  . ALPRAZolam (XANAX) 0.25 MG tablet TAKE ONE TABLET BY MOUTH TWICE DIALY AS NEEDED FOR ANXIETY 20 tablet 1  . B Complex Vitamins (VITAMIN B COMPLEX PO) Take 1 tablet by mouth daily.     . Blood Glucose Monitoring Suppl (ONE TOUCH ULTRA 2) w/Device KIT See admin instructions.  0  . furosemide (LASIX) 20 MG tablet Take 1 tablet (20 mg total) by mouth daily as needed for fluid or edema. 30 tablet 1  . glucose blood (IGLUCOSE TEST STRIPS) test strip Test Blood Sugars every day as needed 100 each 5  . glucose monitoring kit (FREESTYLE) monitoring kit 1 each by Does not apply route as needed for other. 1 each 0  . Lancets MISC Test Blood Sugars every day as needed 100 each 5  . Multiple Vitamins-Minerals (MULTIVITAMIN ADULTS) TABS Take 1 tablet by mouth daily.    . potassium chloride SA (K-DUR,KLOR-CON) 20 MEQ tablet Take 1 tablet (20 mEq total) by mouth daily as needed (lasix use). 30 tablet 0  . warfarin (COUMADIN) 5 MG tablet Take 1 tablet (5 mg) by mouth daily except on Mon Take 0.5 tablet (2.5 mg) 90 tablet 3  . losartan (COZAAR) 100 MG tablet Take 1 tablet (100 mg total) by mouth daily. 90 tablet 1   No facility-administered medications prior to visit.     Allergies  Allergen Reactions  . Amlodipine Swelling    "my whole body swelled"  . Carvedilol Swelling    "my whole body  swelled"  . Cefuroxime Axetil     REACTION: Ddoes not remember reaction  . Chlorthalidone     Patient reported extreme weakness.  . Statins Other (See Comments)  Weakness, pain Lipitor and Pravachol Weakness, pain Lipitor and Pravachol  . Sulfonamide Derivatives     REACTION: Rash    Review of Systems  Constitutional: Positive for malaise/fatigue. Negative for fever.  HENT: Negative for congestion.   Eyes: Negative for blurred vision.  Respiratory: Negative for shortness of breath.   Cardiovascular: Negative for chest pain, palpitations and leg swelling.  Gastrointestinal: Negative for abdominal pain, blood in stool and nausea.  Genitourinary: Positive for hematuria. Negative for dysuria, flank pain and frequency.  Musculoskeletal: Negative for falls.  Skin: Negative for rash.  Neurological: Negative for dizziness, loss of consciousness and headaches.  Endo/Heme/Allergies: Negative for environmental allergies.  Psychiatric/Behavioral: Negative for depression. The patient is not nervous/anxious.        Objective:    Physical Exam  Constitutional: She is oriented to person, place, and time. She appears well-developed and well-nourished. No distress.  HENT:  Head: Normocephalic and atraumatic.  Nose: Nose normal.  Eyes: Right eye exhibits no discharge. Left eye exhibits no discharge.  Neck: Normal range of motion. Neck supple.  Cardiovascular: Normal rate and regular rhythm.  No murmur heard. Pulmonary/Chest: Effort normal and breath sounds normal.  Abdominal: Soft. Bowel sounds are normal. There is no tenderness.  Musculoskeletal: She exhibits no edema.  Neurological: She is alert and oriented to person, place, and time.  Skin: Skin is warm and dry.  Psychiatric: She has a normal mood and affect.  Nursing note and vitals reviewed.   BP (!) 162/82 (BP Location: Left Arm, Patient Position: Sitting, Cuff Size: Normal)   Pulse 78   Temp 97.8 F (36.6 C) (Oral)   Resp  18   Ht '5\' 3"'  (1.6 m)   Wt 173 lb 12.8 oz (78.8 kg)   SpO2 95%   BMI 30.79 kg/m  Wt Readings from Last 3 Encounters:  09/17/17 173 lb 12.8 oz (78.8 kg)  09/06/17 173 lb 3.2 oz (78.6 kg)  08/17/17 175 lb 6.4 oz (79.6 kg)   BP Readings from Last 3 Encounters:  09/17/17 (!) 162/82  09/06/17 (!) 162/88  08/17/17 132/78     Immunization History  Administered Date(s) Administered  . Influenza Split 11/14/2010  . Influenza Whole 11/08/2005, 10/28/2007, 11/23/2008, 11/26/2009  . Influenza, High Dose Seasonal PF 10/19/2015, 10/16/2016  . Influenza,inj,Quad PF,6+ Mos 11/26/2012, 09/22/2013, 10/20/2014  . Pneumococcal Conjugate-13 04/01/2013  . Pneumococcal Polysaccharide-23 10/30/2001, 07/27/2009  . Td 11/27/2001  . Tetanus 04/01/2013    Health Maintenance  Topic Date Due  . OPHTHALMOLOGY EXAM  08/10/2015  . FOOT EXAM  04/17/2017  . INFLUENZA VACCINE  08/30/2017  . HEMOGLOBIN A1C  12/16/2017  . TETANUS/TDAP  04/02/2023  . DEXA SCAN  Completed  . PNA vac Low Risk Adult  Completed    Lab Results  Component Value Date   WBC 10.9 (H) 08/17/2017   HGB 13.9 08/17/2017   HCT 40.0 08/17/2017   PLT 344 08/17/2017   GLUCOSE 100 (H) 08/17/2017   CHOL 224 (H) 06/15/2017   TRIG 216.0 (H) 06/15/2017   HDL 35.60 (L) 06/15/2017   LDLDIRECT 145.0 06/15/2017   LDLCALC 134 (H) 03/05/2017   ALT 9 08/17/2017   AST 18 08/17/2017   NA 141 08/17/2017   K 4.7 08/17/2017   CL 104 08/17/2017   CREATININE 0.85 08/17/2017   BUN 22 08/17/2017   CO2 27 08/17/2017   TSH 3.08 06/15/2017   INR 2.5 08/17/2017   HGBA1C 6.6 (H) 06/15/2017   MICROALBUR 1.5 10/20/2014  Lab Results  Component Value Date   TSH 3.08 06/15/2017   Lab Results  Component Value Date   WBC 10.9 (H) 08/17/2017   HGB 13.9 08/17/2017   HCT 40.0 08/17/2017   MCV 86.8 08/17/2017   PLT 344 08/17/2017   Lab Results  Component Value Date   NA 141 08/17/2017   K 4.7 08/17/2017   CHLORIDE 109 08/12/2013   CO2 27  08/17/2017   GLUCOSE 100 (H) 08/17/2017   BUN 22 08/17/2017   CREATININE 0.85 08/17/2017   BILITOT 0.6 08/17/2017   ALKPHOS 68 06/15/2017   AST 18 08/17/2017   ALT 9 08/17/2017   PROT 6.9 08/17/2017   ALBUMIN 3.9 06/15/2017   CALCIUM 9.1 08/17/2017   ANIONGAP 6 08/17/2016   GFR 71.02 06/15/2017   Lab Results  Component Value Date   CHOL 224 (H) 06/15/2017   Lab Results  Component Value Date   HDL 35.60 (L) 06/15/2017   Lab Results  Component Value Date   LDLCALC 134 (H) 03/05/2017   Lab Results  Component Value Date   TRIG 216.0 (H) 06/15/2017   Lab Results  Component Value Date   CHOLHDL 6 06/15/2017   Lab Results  Component Value Date   HGBA1C 6.6 (H) 06/15/2017         Assessment & Plan:   Problem List Items Addressed This Visit    Hyperlipidemia    Encouraged heart healthy diet, increase exercise, avoid trans fats, consider a krill oil cap daily      Relevant Medications   losartan (COZAAR) 100 MG tablet   hydrALAZINE (APRESOLINE) 25 MG tablet   Other Relevant Orders   Lipid panel   Essential hypertension    Poorly controlled will alter medications, encouraged DASH diet, minimize caffeine and obtain adequate sleep. Report concerning symptoms and follow up as directed and as needed. Reviewed cardiology note and will start Hydralazine 25 mg po bid per their recommendation and see patient in follow up next month      Relevant Medications   losartan (COZAAR) 100 MG tablet   hydrALAZINE (APRESOLINE) 25 MG tablet   Other Relevant Orders   CBC   Comprehensive metabolic panel   TSH   Diabetes mellitus type 2 in obese (HCC)    hgba1c acceptable, minimize simple carbs. Increase exercise as tolerated      Relevant Medications   losartan (COZAAR) 100 MG tablet   Other Relevant Orders   Hemoglobin A1c   Kidney stones    Sees some blood with urination but no dysuria. Encouraged to stay hydrated with lemon. Has an appt with [facility] Dr Amalia Hailey on  8/29       Other Visit Diagnoses    Hematuria, unspecified type    -  Primary   Relevant Orders   Urinalysis   Urine Culture      I am having Kathrine Cords. Zachery start on hydrALAZINE. I am also having her maintain her B Complex Vitamins (VITAMIN B COMPLEX PO), potassium chloride SA, glucose monitoring kit, Lancets, glucose blood, MULTIVITAMIN ADULTS, ONE TOUCH ULTRA 2, furosemide, warfarin, ALPRAZolam, and losartan.  Meds ordered this encounter  Medications  . losartan (COZAAR) 100 MG tablet    Sig: Take 1 tablet (100 mg total) by mouth daily.    Dispense:  90 tablet    Refill:  1  . hydrALAZINE (APRESOLINE) 25 MG tablet    Sig: Take 1 tablet (25 mg total) by mouth 2 (two) times daily.  Dispense:  60 tablet    Refill:  3    CMA served as scribe during this visit. History, Physical and Plan performed by medical provider. Documentation and orders reviewed and attested to.  Penni Homans, MD

## 2017-09-18 LAB — URINE CULTURE
MICRO NUMBER:: 90983882
SPECIMEN QUALITY:: ADEQUATE

## 2017-09-28 ENCOUNTER — Ambulatory Visit (INDEPENDENT_AMBULATORY_CARE_PROVIDER_SITE_OTHER): Payer: Medicare Other | Admitting: General Practice

## 2017-09-28 DIAGNOSIS — Z7901 Long term (current) use of anticoagulants: Secondary | ICD-10-CM | POA: Diagnosis not present

## 2017-09-28 DIAGNOSIS — Z86718 Personal history of other venous thrombosis and embolism: Secondary | ICD-10-CM

## 2017-09-28 LAB — POCT INR: INR: 2.3 (ref 2.0–3.0)

## 2017-09-28 NOTE — Patient Instructions (Addendum)
Pre visit review using our clinic review tool, if applicable. No additional management support is needed unless otherwise documented below in the visit note.  Continue to take 1 tablet daily with 1/2 tablet on Mondays.  Re-check in 6 weeks.  

## 2017-10-18 ENCOUNTER — Other Ambulatory Visit: Payer: Self-pay

## 2017-10-18 ENCOUNTER — Encounter: Payer: Self-pay | Admitting: Family Medicine

## 2017-10-18 ENCOUNTER — Emergency Department (HOSPITAL_BASED_OUTPATIENT_CLINIC_OR_DEPARTMENT_OTHER)
Admission: EM | Admit: 2017-10-18 | Discharge: 2017-10-18 | Disposition: A | Discharge status: Home / Self Care | Payer: Medicare Other | Attending: Emergency Medicine | Admitting: Emergency Medicine

## 2017-10-18 ENCOUNTER — Emergency Department (HOSPITAL_BASED_OUTPATIENT_CLINIC_OR_DEPARTMENT_OTHER): Payer: Medicare Other

## 2017-10-18 ENCOUNTER — Ambulatory Visit: Payer: Medicare Other | Admitting: Family Medicine

## 2017-10-18 ENCOUNTER — Encounter (HOSPITAL_BASED_OUTPATIENT_CLINIC_OR_DEPARTMENT_OTHER): Payer: Self-pay | Admitting: *Deleted

## 2017-10-18 DIAGNOSIS — S61411A Laceration without foreign body of right hand, initial encounter: Secondary | ICD-10-CM | POA: Diagnosis present

## 2017-10-18 DIAGNOSIS — R600 Localized edema: Secondary | ICD-10-CM

## 2017-10-18 DIAGNOSIS — E782 Mixed hyperlipidemia: Secondary | ICD-10-CM

## 2017-10-18 DIAGNOSIS — E669 Obesity, unspecified: Secondary | ICD-10-CM

## 2017-10-18 DIAGNOSIS — W230XXA Caught, crushed, jammed, or pinched between moving objects, initial encounter: Secondary | ICD-10-CM | POA: Diagnosis not present

## 2017-10-18 DIAGNOSIS — Z853 Personal history of malignant neoplasm of breast: Secondary | ICD-10-CM | POA: Diagnosis not present

## 2017-10-18 DIAGNOSIS — Y93H9 Activity, other involving exterior property and land maintenance, building and construction: Secondary | ICD-10-CM | POA: Diagnosis not present

## 2017-10-18 DIAGNOSIS — E119 Type 2 diabetes mellitus without complications: Secondary | ICD-10-CM | POA: Insufficient documentation

## 2017-10-18 DIAGNOSIS — Z79899 Other long term (current) drug therapy: Secondary | ICD-10-CM | POA: Diagnosis not present

## 2017-10-18 DIAGNOSIS — I10 Essential (primary) hypertension: Secondary | ICD-10-CM | POA: Insufficient documentation

## 2017-10-18 DIAGNOSIS — Y999 Unspecified external cause status: Secondary | ICD-10-CM | POA: Diagnosis not present

## 2017-10-18 DIAGNOSIS — Z7984 Long term (current) use of oral hypoglycemic drugs: Secondary | ICD-10-CM | POA: Insufficient documentation

## 2017-10-18 DIAGNOSIS — Y929 Unspecified place or not applicable: Secondary | ICD-10-CM | POA: Diagnosis not present

## 2017-10-18 DIAGNOSIS — Z23 Encounter for immunization: Secondary | ICD-10-CM | POA: Diagnosis not present

## 2017-10-18 DIAGNOSIS — Z7901 Long term (current) use of anticoagulants: Secondary | ICD-10-CM | POA: Diagnosis not present

## 2017-10-18 DIAGNOSIS — E1169 Type 2 diabetes mellitus with other specified complication: Secondary | ICD-10-CM

## 2017-10-18 DIAGNOSIS — Z96641 Presence of right artificial hip joint: Secondary | ICD-10-CM | POA: Diagnosis not present

## 2017-10-18 MED ORDER — CEPHALEXIN 500 MG PO CAPS: 500.0000 mg | ORAL_CAPSULE | Freq: Four times a day (QID) | ORAL | 0 refills | Status: DC

## 2017-10-18 MED ORDER — LIDOCAINE HCL (PF) 1 % IJ SOLN
5.0000 mL | Freq: Once | INTRAMUSCULAR | Status: AC
  Administered 2017-10-18: 5 mL
  Filled 2017-10-18: qty 5

## 2017-10-18 MED ORDER — ACETAMINOPHEN 325 MG PO TABS
650.0000 mg | ORAL_TABLET | Freq: Once | ORAL | Status: AC
  Administered 2017-10-18: 650 mg via ORAL
  Filled 2017-10-18: qty 2

## 2017-10-18 MED ORDER — TETANUS-DIPHTH-ACELL PERTUSSIS 5-2.5-18.5 LF-MCG/0.5 IM SUSP
0.5000 mL | Freq: Once | INTRAMUSCULAR | Status: AC
  Administered 2017-10-18: 0.5 mL via INTRAMUSCULAR
  Filled 2017-10-18: qty 0.5

## 2017-10-18 NOTE — Patient Instructions (Signed)

## 2017-10-18 NOTE — Progress Notes (Signed)
Joint and chest pain

## 2017-10-18 NOTE — ED Triage Notes (Signed)
Pt cut her hand on a piece of farm equipment, pt is on coumadin.

## 2017-10-18 NOTE — Progress Notes (Signed)
Subjective:    Patient ID: [name] Little, female    DOB: [birth year], 84 y.o.   MRN: [id number]  No chief complaint on file.   HPI Patient is in today for follow up. She did not tolerate the addition of Hydralazine as it made her feel poorly with chest pain and joint pain. As soon as she stopped the Hydralazine her pain resolved. Denies palp/SOB/HA/congestion/fevers/GI or GU c/o. Taking meds as prescribed. No polyuria or polydipsia. Is trying to maintain a heart healthy diet.   Past Medical History:  Diagnosis Date  . Anxiety state, unspecified 09/22/2013  . Arthritis    lumbar stenosis, radiculopathy, OA- L hip  . BCC (basal cell carcinoma), scalp/neck 09/27/2015  . Blood clotting disorder (HCC)    V5 factor  . Blood dyscrasia    factor 5 deficiency  . Breast cancer ([place]) 2010   RIGHT breast  . Clostridium difficile infection   . Clotting disorder ([place])   . Diabetes mellitus type 2 in obese ([place]) 02/26/2014  . DVT (deep venous thrombosis) ([place])   . Factor V deficiency ([place])   . Hemorrhoids   . Hyperglycemia 02/26/2014  . Hyperlipemia   . Hypertension   . Infiltrating ductal carcinoma of breast ([place])    stage one right   . Low back pain 03/26/2014  . Macular degeneration   . Mitral valve regurgitation 09/01/2016  . Mixed hyperlipidemia 05/18/2015  . Nocturia 02/26/2014  . Paroxysmal supraventricular tachycardia ([place])   . Personal history of colonic polyps    hyperplastic  . Thoracic aortic aneurysm ([place])   . TMJ pain dysfunction syndrome     Past Surgical History:  Procedure Laterality Date  . ABDOMINAL HYSTERECTOMY     1960- ? vaginal hysterectomy, not laparoscopic   . BREAST LUMPECTOMY  2010   right Dr. [name]  . COLONOSCOPY    . EYE SURGERY     bilateral cataracts- /w IOL   . JOINT REPLACEMENT     R hip replacement   . LAPAROSCOPIC HYSTERECTOMY    . LUMBAR LAMINECTOMY/DECOMPRESSION MICRODISCECTOMY  04/11/2011   Procedure: LUMBAR LAMINECTOMY/DECOMPRESSION  MICRODISCECTOMY;  Surgeon: [name], MD;  Location: [place] [facility];  Service: Neurosurgery;  Laterality: N/A;  Lumbar Five-Sacral One Microdiscectomy  . right calf surgery     for cancer  . SPLENECTOMY    . TOTAL HIP ARTHROPLASTY  1999   right  . UPPER GASTROINTESTINAL ENDOSCOPY      Family History  Problem Relation Age of Onset  . Heart disease Mother   . Heart disease Father   . Alzheimer's disease Sister   . Heart disease Unknown        uncles  . Clotting disorder Maternal Uncle   . Breast cancer Paternal Aunt   . Cancer Paternal Aunt        breast  . Colon cancer Neg Hx   . Anesthesia problems Neg Hx   . Hypotension Neg Hx   . Malignant hyperthermia Neg Hx   . Pseudochol deficiency Neg Hx     Social History   Socioeconomic History  . Marital status: Married    Spouse name: Not on file  . Number of children: 4  . Years of education: Not on file  . Highest education level: Not on file  Occupational History  . Occupation: Retired  Scientific laboratory technician  . Financial resource strain: Not on file  . Food insecurity:    Worry: Not on file    Inability:  Not on file  . Transportation needs:    Medical: Not on file    Non-medical: Not on file  Tobacco Use  . Smoking status: Never Smoker  . Smokeless tobacco: Never Used  Substance and Sexual Activity  . Alcohol use: No    Alcohol/week: 0.0 standard drinks  . Drug use: No  . Sexual activity: Never    Partners: Male  Lifestyle  . Physical activity:    Days per week: Not on file    Minutes per session: Not on file  . Stress: Not on file  Relationships  . Social connections:    Talks on phone: Not on file    Gets together: Not on file    Attends religious service: Not on file    Active member of club or organization: Not on file    Attends meetings of clubs or organizations: Not on file    Relationship status: Not on file  . Intimate partner violence:    Fear of current or ex partner: Not on file    Emotionally  abused: Not on file    Physically abused: Not on file    Forced sexual activity: Not on file  Other Topics Concern  . Not on file  Social History Narrative   Married 1955   2 sons - '59, '61, 2 daughters '55, '57; 8 grandchildren; 1 great grand   I-ADLs       Outpatient Medications Prior to Visit  Medication Sig Dispense Refill  . ALPRAZolam (XANAX) 0.25 MG tablet TAKE ONE TABLET BY MOUTH TWICE DIALY AS NEEDED FOR ANXIETY 20 tablet 1  . B Complex Vitamins (VITAMIN B COMPLEX PO) Take 1 tablet by mouth daily.     . Blood Glucose Monitoring Suppl (ONE TOUCH ULTRA 2) w/Device KIT See admin instructions.  0  . furosemide (LASIX) 20 MG tablet Take 1 tablet (20 mg total) by mouth daily as needed for fluid or edema. 30 tablet 1  . glucose blood (IGLUCOSE TEST STRIPS) test strip Test Blood Sugars every day as needed 100 each 5  . glucose monitoring kit (FREESTYLE) monitoring kit 1 each by Does not apply route as needed for other. 1 each 0  . Lancets MISC Test Blood Sugars every day as needed 100 each 5  . losartan (COZAAR) 100 MG tablet Take 1 tablet (100 mg total) by mouth daily. 90 tablet 1  . Multiple Vitamins-Minerals (MULTIVITAMIN ADULTS) TABS Take 1 tablet by mouth daily.    . potassium chloride SA (K-DUR,KLOR-CON) 20 MEQ tablet Take 1 tablet (20 mEq total) by mouth daily as needed (lasix use). 30 tablet 0  . warfarin (COUMADIN) 5 MG tablet Take 1 tablet (5 mg) by mouth daily except on Mon Take 0.5 tablet (2.5 mg) 90 tablet 3  . hydrALAZINE (APRESOLINE) 25 MG tablet Take 1 tablet (25 mg total) by mouth 2 (two) times daily. 60 tablet 3   No facility-administered medications prior to visit.     Allergies  Allergen Reactions  . Hydralazine Other (See Comments)    Joint and chest pain  . Amlodipine Swelling    "my whole body swelled"  . Carvedilol Swelling    "my whole body swelled"  . Cefuroxime Axetil     REACTION: Ddoes not remember reaction  . Chlorthalidone     Patient reported  extreme weakness.  . Statins Other (See Comments)    Weakness, pain Lipitor and Pravachol Weakness, pain Lipitor and Pravachol  . Sulfonamide Derivatives  REACTION: Rash    Review of Systems  Constitutional: Positive for malaise/fatigue. Negative for fever.  HENT: Negative for congestion.   Eyes: Negative for blurred vision.  Respiratory: Negative for shortness of breath.   Cardiovascular: Positive for leg swelling. Negative for chest pain and palpitations.  Gastrointestinal: Negative for abdominal pain, blood in stool and nausea.  Genitourinary: Negative for dysuria and frequency.  Musculoskeletal: Negative for falls.  Skin: Negative for rash.  Neurological: Negative for dizziness, loss of consciousness and headaches.  Endo/Heme/Allergies: Negative for environmental allergies.  Psychiatric/Behavioral: Negative for depression. The patient is not nervous/anxious.        Objective:    Physical Exam  Constitutional: She is oriented to person, place, and time. She appears well-developed and well-nourished. No distress.  HENT:  Head: Normocephalic and atraumatic.  Nose: Nose normal.  Eyes: Right eye exhibits no discharge. Left eye exhibits no discharge.  Neck: Normal range of motion. Neck supple.  Cardiovascular: Normal rate and regular rhythm.  Pulmonary/Chest: Effort normal and breath sounds normal.  Abdominal: Soft. Bowel sounds are normal. There is no tenderness.  Musculoskeletal: She exhibits no edema.  Neurological: She is alert and oriented to person, place, and time.  Skin: Skin is warm and dry.  Psychiatric: She has a normal mood and affect.  Nursing note and vitals reviewed.   BP (!) 142/80   Pulse 76   Temp 98.1 F (36.7 C) (Oral)   Resp 18   Ht _0  (1.6 m)   Wt 173 lb (78.5 kg)   SpO2 95%   BMI 30.65 kg/m  Wt Readings from Last 3 Encounters:  10/18/17 173 lb (78.5 kg)  10/18/17 173 lb (78.5 kg)  09/17/17 173 lb 12.8 oz (78.8 kg)     Lab  Results  Component Value Date   WBC 8.6 09/17/2017   HGB 14.4 09/17/2017   HCT 44.4 09/17/2017   PLT 312.0 09/17/2017   GLUCOSE 117 (H) 09/17/2017   CHOL 219 (H) 09/17/2017   TRIG 315.0 (H) 09/17/2017   HDL 36.40 (L) 09/17/2017   LDLDIRECT 121.0 09/17/2017   LDLCALC 134 (H) 03/05/2017   ALT 12 09/17/2017   AST 18 09/17/2017   NA 143 09/17/2017   K 4.7 09/17/2017   CL 107 09/17/2017   CREATININE 0.90 09/17/2017   BUN 17 09/17/2017   CO2 28 09/17/2017   TSH 4.40 09/17/2017   INR 2.3 09/28/2017   HGBA1C 6.5 09/17/2017   MICROALBUR 1.5 10/20/2014    Lab Results  Component Value Date   TSH 4.40 09/17/2017   Lab Results  Component Value Date   WBC 8.6 09/17/2017   HGB 14.4 09/17/2017   HCT 44.4 09/17/2017   MCV 91.4 09/17/2017   PLT 312.0 09/17/2017   Lab Results  Component Value Date   NA 143 09/17/2017   K 4.7 09/17/2017   CHLORIDE 109 08/12/2013   CO2 28 09/17/2017   GLUCOSE 117 (H) 09/17/2017   BUN 17 09/17/2017   CREATININE 0.90 09/17/2017   BILITOT 0.5 09/17/2017   ALKPHOS 75 09/17/2017   AST 18 09/17/2017   ALT 12 09/17/2017   PROT 7.2 09/17/2017   ALBUMIN 4.0 09/17/2017   CALCIUM 9.7 09/17/2017   ANIONGAP 6 08/17/2016   GFR 63.75 09/17/2017   Lab Results  Component Value Date   CHOL 219 (H) 09/17/2017   Lab Results  Component Value Date   HDL 36.40 (L) 09/17/2017   Lab Results  Component Value Date  LDLCALC 134 (H) 03/05/2017   Lab Results  Component Value Date   TRIG 315.0 (H) 09/17/2017   Lab Results  Component Value Date   CHOLHDL 6 09/17/2017   Lab Results  Component Value Date   HGBA1C 6.5 09/17/2017       Assessment & Plan:   Problem List Items Addressed This Visit    Hyperlipidemia    Encouraged heart healthy diet, increase exercise, avoid trans fats, consider a krill oil cap daily      Essential hypertension    Well controlled, no changes to meds. Encouraged heart healthy diet such as the DASH diet and exercise as  tolerated. impoved on recheck reports BP ranging from 132-156/72-91.      Diabetes mellitus type 2 in obese (HCC)    hgba1c acceptable, minimize simple carbs. Increase exercise as tolerated. Continue current meds      Lower extremity edema    Elevate feet, minimize sodium, compression hose prn         I have discontinued Kathrine Cords. Menter's hydrALAZINE. I am also having her maintain her B Complex Vitamins (VITAMIN B COMPLEX PO), potassium chloride SA, glucose monitoring kit, Lancets, glucose blood, MULTIVITAMIN ADULTS, ONE TOUCH ULTRA 2, furosemide, warfarin, ALPRAZolam, and losartan.  No orders of the defined types were placed in this encounter.    Penni Homans, MD

## 2017-10-18 NOTE — ED Provider Notes (Signed)
[facility] EMERGENCY DEPARTMENT Provider Note   CSN: [id number] Arrival date & time: 10/18/17  1249     History   Chief Complaint Chief Complaint  Patient presents with  . Extremity Laceration    HPI [name] Little is a 84 y.o. female.  Patient presents the ED with right hand pain after crush injury.  Patient states that her right hand got stuck briefly and hydraulic system of farm equipment today.  States that she was helping her husband when her right hand got caught in a hydraulic system and states that she had may be seconds of pressure on her right hand but because significant laceration to her right hand.  Patient is on Coumadin but bleeding is hemostatic.  Denies any numbness and tingling of the extremity.  Is able to move all of her fingers without any issues.  The history is provided by the patient.  Hand Pain  This is a new problem. The current episode started less than 1 hour ago. The problem occurs constantly. The problem has not changed since onset.Pertinent negatives include no chest pain, no abdominal pain, no headaches and no shortness of breath. Nothing aggravates the symptoms. Nothing relieves the symptoms. She has tried nothing for the symptoms. The treatment provided no relief.    Past Medical History:  Diagnosis Date  . Anxiety state, unspecified 09/22/2013  . Arthritis    lumbar stenosis, radiculopathy, OA- L hip  . BCC (basal cell carcinoma), scalp/neck 09/27/2015  . Blood clotting disorder (HCC)    V5 factor  . Blood dyscrasia    factor 5 deficiency  . Breast cancer ([place]) 2010   RIGHT breast  . Clostridium difficile infection   . Clotting disorder ([place])   . Diabetes mellitus type 2 in obese ([place]) 02/26/2014  . DVT (deep venous thrombosis) ([place])   . Factor V deficiency ([place])   . Hemorrhoids   . Hyperglycemia 02/26/2014  . Hyperlipemia   . Hypertension   . Infiltrating ductal carcinoma of breast ([place])    stage one right   . Low back pain  03/26/2014  . Macular degeneration   . Mitral valve regurgitation 09/01/2016  . Mixed hyperlipidemia 05/18/2015  . Nocturia 02/26/2014  . Paroxysmal supraventricular tachycardia ([place])   . Personal history of colonic polyps    hyperplastic  . Thoracic aortic aneurysm ([place])   . TMJ pain dysfunction syndrome     Patient Active Problem List   Diagnosis Date Noted  . Kidney stones 09/17/2017  . Aortic atherosclerosis ([place]) 08/18/2017  . Mitral valve regurgitation 09/01/2016  . History of UTI 08/22/2016  . Lower extremity edema 08/22/2016  . BCC (basal cell carcinoma), scalp/neck 09/27/2015  . Pain in limb 05/30/2015  . Low back pain 03/26/2014  . Diabetes mellitus type 2 in obese ([place]) 02/26/2014  . Nocturia 02/26/2014  . Obesity (BMI 30-39.9) 02/03/2014  . Anxiety state 09/22/2013  . FH: factor V Leiden mutation 08/19/2013  . Preventative health care 04/02/2013  . Encounter for therapeutic drug monitoring 03/26/2013  . Anticoagulant long-term use 08/23/2010  . CALLUS, TOE 01/05/2010  . CHEST PAIN 08/23/2009  . hx: breast cancer, IDC, right LOQ, receptor + her 2 - 07/27/2009  . PHLEBITIS&THROMBOPHLEB SUP VESSELS LOWER EXTREM 12/11/2007  . HEMORRHOIDS 12/09/2007  . COLONIC POLYPS, HX OF 12/09/2007  . Essential hypertension 09/18/2007  . Venous (peripheral) insufficiency 07/02/2007  . Hyperlipidemia 06/04/2007  . Paroxysmal supraventricular tachycardia ([place]) 03/28/2007  . Aneurysm of thoracic aorta ([place]) 11/25/2006  .  DVT, HX OF 08/11/2006    Past Surgical History:  Procedure Laterality Date  . ABDOMINAL HYSTERECTOMY     1960- ? vaginal hysterectomy, not laparoscopic   . BREAST LUMPECTOMY  2010   right Dr. [name]  . COLONOSCOPY    . EYE SURGERY     bilateral cataracts- /w IOL   . JOINT REPLACEMENT     R hip replacement   . LAPAROSCOPIC HYSTERECTOMY    . LUMBAR LAMINECTOMY/DECOMPRESSION MICRODISCECTOMY  04/11/2011   Procedure: LUMBAR LAMINECTOMY/DECOMPRESSION  MICRODISCECTOMY;  Surgeon: [name], MD;  Location: [facility];  Service: Neurosurgery;  Laterality: N/A;  Lumbar Five-Sacral One Microdiscectomy  . right calf surgery     for cancer  . SPLENECTOMY    . TOTAL HIP ARTHROPLASTY  1999   right  . UPPER GASTROINTESTINAL ENDOSCOPY       OB History   None      Home Medications    Prior to Admission medications   Medication Sig Start Date End Date Taking? Authorizing Provider  ALPRAZolam Duanne Moron) 0.25 MG tablet TAKE ONE TABLET BY MOUTH TWICE DIALY AS NEEDED FOR ANXIETY 06/15/17   Mosie Lukes, MD  B Complex Vitamins (VITAMIN B COMPLEX PO) Take 1 tablet by mouth daily.     [provider]  Blood Glucose Monitoring Suppl (ONE TOUCH ULTRA 2) w/Device KIT See admin instructions. 07/25/16   [provider]  cephALEXin (KEFLEX) 500 MG capsule Take 1 capsule (500 mg total) by mouth 4 (four) times daily for 7 days. 10/18/17 10/25/17  Hudsyn Champine, DO  furosemide (LASIX) 20 MG tablet Take 1 tablet (20 mg total) by mouth daily as needed for fluid or edema. 08/22/16   Ann Held, DO  glucose blood (IGLUCOSE TEST STRIPS) test strip Test Blood Sugars every day as needed 07/25/16   Mosie Lukes, MD  glucose monitoring kit (FREESTYLE) monitoring kit 1 each by Does not apply route as needed for other. 07/25/16   Mosie Lukes, MD  Lancets MISC Test Blood Sugars every day as needed 07/25/16   Mosie Lukes, MD  losartan (COZAAR) 100 MG tablet Take 1 tablet (100 mg total) by mouth daily. 09/17/17   Mosie Lukes, MD  Multiple Vitamins-Minerals (MULTIVITAMIN ADULTS) TABS Take 1 tablet by mouth daily.    [provider]  potassium chloride SA (K-DUR,KLOR-CON) 20 MEQ tablet Take 1 tablet (20 mEq total) by mouth daily as needed (lasix use). 12/28/15   Mosie Lukes, MD  warfarin (COUMADIN) 5 MG tablet Take 1 tablet (5 mg) by mouth daily except on Mon Take 0.5 tablet (2.5 mg) 05/01/17   Mosie Lukes, MD    Family  History Family History  Problem Relation Age of Onset  . Heart disease Mother   . Heart disease Father   . Alzheimer's disease Sister   . Heart disease Unknown        uncles  . Clotting disorder Maternal Uncle   . Breast cancer Paternal Aunt   . Cancer Paternal Aunt        breast  . Colon cancer Neg Hx   . Anesthesia problems Neg Hx   . Hypotension Neg Hx   . Malignant hyperthermia Neg Hx   . Pseudochol deficiency Neg Hx     Social History Social History   Tobacco Use  . Smoking status: Never Smoker  . Smokeless tobacco: Never Used  Substance Use Topics  . Alcohol use: No  Alcohol/week: 0.0 standard drinks  . Drug use: No     Allergies   Hydralazine; Amlodipine; Carvedilol; Cefuroxime axetil; Chlorthalidone; Statins; and Sulfonamide derivatives   Review of Systems Review of Systems  Constitutional: Negative for chills and fever.  HENT: Negative for ear pain and sore throat.   Eyes: Negative for pain and visual disturbance.  Respiratory: Negative for cough and shortness of breath.   Cardiovascular: Negative for chest pain and palpitations.  Gastrointestinal: Negative for abdominal pain and vomiting.  Genitourinary: Negative for dysuria and hematuria.  Musculoskeletal: Negative for arthralgias and back pain.  Skin: Positive for wound. Negative for color change and rash.  Neurological: Negative for seizures, syncope and headaches.  All other systems reviewed and are negative.    Physical Exam Updated Vital Signs  ED Triage Vitals  Enc Vitals Group     BP 10/18/17 1254 (!) 194/101     Pulse Rate 10/18/17 1254 80     Resp 10/18/17 1254 18     Temp 10/18/17 1254 97.7 F (36.5 C)     Temp Source 10/18/17 1254 Oral     SpO2 10/18/17 1254 97 %     Weight 10/18/17 1257 173 lb (78.5 kg)     Height 10/18/17 1257 '5\' 3"'  (1.6 m)     Head Circumference --      Peak Flow --      Pain Score 10/18/17 1256 10     Pain Loc --      Pain Edu? --      Excl. in [place]? --      Physical Exam  Constitutional: She appears well-developed and well-nourished. No distress.  HENT:  Head: Normocephalic and atraumatic.  Eyes: Pupils are equal, round, and reactive to light. Conjunctivae and EOM are normal.  Neck: Normal range of motion. Neck supple.  Cardiovascular: Normal rate and regular rhythm.  No murmur heard. Pulmonary/Chest: Effort normal and breath sounds normal. No respiratory distress.  Abdominal: Soft. There is no tenderness.  Musculoskeletal: Normal range of motion. She exhibits no edema, tenderness or deformity.  Neurological: She is alert.  5+ out of 5 strength in right upper extremity, normal sensation  Skin: Skin is warm and dry. Capillary refill takes less than 2 seconds.  Complex laceration to dorsal and volar aspect of right hand that involves soft tissues, no obvious bone exposure, wound is hemostatic, patient has one laceration on the back of the hand that is about 4 cm and has about 8 to 10 cm laceration on the palmar side of the right hand  Psychiatric: She has a normal mood and affect.  Nursing note and vitals reviewed.    ED Treatments / Results  Labs (all labs ordered are listed, but only abnormal results are displayed) Labs Reviewed - No data to display  EKG None  Radiology Dg Wrist Complete Right  Result Date: 10/18/2017 CLINICAL DATA:  Injury with open wound. EXAM: RIGHT WRIST - COMPLETE 3+ VIEW COMPARISON:  Left hand series same day. FINDINGS: Soft tissue laceration and air noted adjacent to the right metacarpal. No radiopaque foreign body. No acute bony abnormality identified. No evidence of fracture dislocation. IMPRESSION: Soft tissue laceration air noted adjacent to the right fifth metacarpal. No radiopaque foreign body. No acute bony abnormality. Electronically Signed   By: Marcello Moores  Register   On: 10/18/2017 13:40   Dg Hand Complete Right  Result Date: 10/18/2017 CLINICAL DATA:  Injury with open wound. EXAM: RIGHT HAND -  COMPLETE 3+ VIEW  COMPARISON:  No recent prior. FINDINGS: Soft tissue laceration and air noted adjacent to the left fifth metacarpal. No radiopaque foreign body. No acute bony abnormality identified. No evidence of fracture dislocation. Diffuse osteopenia degenerative change. IMPRESSION: Soft tissue laceration air noted adjacent to the left fifth metacarpal. No radiopaque foreign body. No acute bony abnormality. Electronically Signed   By: Marcello Moores  Register   On: 10/18/2017 13:39    Procedures .Marland KitchenLaceration Repair Date/Time: 10/18/2017 4:00 PM Performed by: Lennice Sites, DO Authorized by: Lennice Sites, DO   Consent:    Consent obtained:  Verbal   Consent given by:  Patient   Risks discussed:  Nerve damage, need for additional repair, infection, pain, poor cosmetic result, poor wound healing, retained foreign body, tendon damage and vascular damage   Alternatives discussed:  No treatment, delayed treatment, observation and referral Anesthesia (see MAR for exact dosages):    Anesthesia method:  Local infiltration   Local anesthetic:  Lidocaine 1% w/o epi Laceration details:    Location:  Hand   Hand location:  R palm   Length (cm):  10   Depth (mm):  2 Repair type:    Repair type:  Intermediate Pre-procedure details:    Preparation:  Patient was prepped and draped in usual sterile fashion and imaging obtained to evaluate for foreign bodies Exploration:    Hemostasis achieved with:  Direct pressure   Wound exploration: wound explored through full range of motion and entire depth of wound probed and visualized     Wound extent: vascular damage     Wound extent: no fascia violation noted, no foreign bodies/material noted, no muscle damage noted, no nerve damage noted, no tendon damage noted and no underlying fracture noted     Contaminated: yes   Treatment:    Area cleansed with:  Betadine and saline   Amount of cleaning:  Extensive   Irrigation solution:  Sterile water   Irrigation  volume:  1 liter   Irrigation method:  Pressure wash and syringe   Visualized foreign bodies/material removed: no   Skin repair:    Repair method:  Sutures   Suture size:  5-0   Suture material:  Prolene   Suture technique:  Simple interrupted   Number of sutures:  8 Approximation:    Approximation:  Loose Post-procedure details:    Dressing:  Tube gauze   Patient tolerance of procedure:  Tolerated well, no immediate complications .Marland KitchenLaceration Repair Date/Time: 10/18/2017 4:03 PM Performed by: Lennice Sites, DO Authorized by: Lennice Sites, DO   Consent:    Consent obtained:  Verbal   Consent given by:  Patient   Risks discussed:  Need for additional repair, infection, nerve damage, poor cosmetic result, pain, poor wound healing, retained foreign body, tendon damage and vascular damage   Alternatives discussed:  No treatment, delayed treatment, referral and observation Anesthesia (see MAR for exact dosages):    Anesthesia method:  Local infiltration   Local anesthetic:  Lidocaine 1% w/o epi Laceration details:    Location:  Hand   Hand location:  R hand, dorsum   Length (cm):  4   Depth (mm):  2 Repair type:    Repair type:  Simple Pre-procedure details:    Preparation:  Patient was prepped and draped in usual sterile fashion Exploration:    Hemostasis achieved with:  Direct pressure   Wound exploration: wound explored through full range of motion and entire depth of wound probed and visualized     Wound  extent: vascular damage     Wound extent: no fascia violation noted, no foreign bodies/material noted, no muscle damage noted, no nerve damage noted, no tendon damage noted and no underlying fracture noted     Contaminated: no   Treatment:    Area cleansed with:  Betadine and saline   Amount of cleaning:  Extensive   Irrigation solution:  Sterile saline   Irrigation volume:  1 Liter   Irrigation method:  Pressure wash   Visualized foreign bodies/material removed: no     Skin repair:    Repair method:  Sutures   Suture size:  5-0   Suture material:  Prolene   Suture technique:  Simple interrupted   Number of sutures:  3 Approximation:    Approximation:  Close Post-procedure details:    Dressing:  Tube gauze   Patient tolerance of procedure:  Tolerated well, no immediate complications .Marland KitchenLaceration Repair Date/Time: 10/18/2017 4:04 PM Performed by: Lennice Sites, DO Authorized by: Lennice Sites, DO   Consent:    Consent obtained:  Verbal   Consent given by:  Patient   Risks discussed:  Infection, nerve damage, need for additional repair, pain, poor cosmetic result, retained foreign body, tendon damage, vascular damage and poor wound healing   Alternatives discussed:  No treatment, delayed treatment, observation and referral Anesthesia (see MAR for exact dosages):    Anesthesia method:  Local infiltration   Local anesthetic:  Lidocaine 1% w/o epi Laceration details:    Location:  Hand   Hand location:  R hand, dorsum   Length (cm):  2   Depth (mm):  2 Repair type:    Repair type:  Simple Pre-procedure details:    Preparation:  Patient was prepped and draped in usual sterile fashion and imaging obtained to evaluate for foreign bodies Exploration:    Hemostasis achieved with:  Direct pressure   Wound exploration: wound explored through full range of motion and entire depth of wound probed and visualized     Wound extent: vascular damage     Wound extent: no areolar tissue violation noted, no fascia violation noted, no foreign bodies/material noted, no muscle damage noted, no nerve damage noted, no tendon damage noted and no underlying fracture noted   Treatment:    Area cleansed with:  Betadine and saline   Amount of cleaning:  Standard   Irrigation solution:  Sterile saline and sterile water   Irrigation volume:  1 L   Irrigation method:  Pressure wash   Visualized foreign bodies/material removed: no   Skin repair:    Repair method:   Sutures   Suture size:  5-0   Suture material:  Prolene   Number of sutures:  3 Approximation:    Approximation:  Close Post-procedure details:    Dressing:  Tube gauze and non-adherent dressing   Patient tolerance of procedure:  Tolerated well, no immediate complications   (including critical care time)  Medications Ordered in ED Medications  Tdap (BOOSTRIX) injection 0.5 mL (0.5 mLs Intramuscular Given 10/18/17 1346)  lidocaine (PF) (XYLOCAINE) 1 % injection 5 mL (5 mLs Infiltration Given by Other 10/18/17 1350)  lidocaine (PF) (XYLOCAINE) 1 % injection 5 mL (5 mLs Infiltration Given by Other 10/18/17 1453)  acetaminophen (TYLENOL) tablet 650 mg (650 mg Oral Given 10/18/17 1550)     Initial Impression / Assessment and Plan / ED Course  I have reviewed the triage vital signs and the nursing notes.  Pertinent labs & imaging results that were  available during my care of the patient were reviewed by me and considered in my medical decision making (see chart for details).     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  Patient with unremarkable vitals.  No fever.  Patient got her hand caught in hydraulic equipment today while at home.  States that her hand got caught in between a hydraulic system for several seconds but seem to clip her right hand.  No prolonged crushing force.  Patient is neurovascularly intact on exam.  Neuromuscularly intact on exam.  Has normal strength and sensation in the right hand.  She has complex lacerations to the palm and dorsum of her right hand.  Wound is hemostatic.  X-rays were obtained that showed no acute fractures and no retained foreign bodies.  Wounds were extensively cleaned and sutured.  Patient to be started on Keflex as concern for possible contamination.  She was given a tetanus shot.  Recommend follow-up with primary care doctor in 1 week for wound  recheck and suture removals.  Told to return to the ED if symptoms worsen including any signs of infection.  Given wound care instructions and discharged in ED good condition.  This chart was dictated using voice recognition software.  Despite best efforts to proofread,  errors can occur which can change the documentation meaning.   Final Clinical Impressions(s) / ED Diagnoses   Final diagnoses:  Laceration of right hand, foreign body presence unspecified, initial encounter    ED Discharge Orders         Ordered    cephALEXin (KEFLEX) 500 MG capsule  4 times daily     10/18/17 [place], [place], DO 10/18/17 1611

## 2017-10-18 NOTE — ED Notes (Signed)
NAD at this time. Pt is stable and going home.  

## 2017-10-21 ENCOUNTER — Emergency Department (HOSPITAL_BASED_OUTPATIENT_CLINIC_OR_DEPARTMENT_OTHER)
Admission: EM | Admit: 2017-10-21 | Discharge: 2017-10-21 | Disposition: A | Discharge status: Home / Self Care | Payer: Medicare Other | Attending: Emergency Medicine | Admitting: Emergency Medicine

## 2017-10-21 ENCOUNTER — Other Ambulatory Visit: Payer: Self-pay

## 2017-10-21 ENCOUNTER — Encounter (HOSPITAL_BASED_OUTPATIENT_CLINIC_OR_DEPARTMENT_OTHER): Payer: Self-pay | Admitting: *Deleted

## 2017-10-21 DIAGNOSIS — W3089XD Contact with other specified agricultural machinery, subsequent encounter: Secondary | ICD-10-CM | POA: Diagnosis not present

## 2017-10-21 DIAGNOSIS — I10 Essential (primary) hypertension: Secondary | ICD-10-CM | POA: Diagnosis not present

## 2017-10-21 DIAGNOSIS — Z85828 Personal history of other malignant neoplasm of skin: Secondary | ICD-10-CM | POA: Diagnosis not present

## 2017-10-21 DIAGNOSIS — Z96641 Presence of right artificial hip joint: Secondary | ICD-10-CM | POA: Diagnosis not present

## 2017-10-21 DIAGNOSIS — Z5189 Encounter for other specified aftercare: Secondary | ICD-10-CM

## 2017-10-21 DIAGNOSIS — Z853 Personal history of malignant neoplasm of breast: Secondary | ICD-10-CM | POA: Insufficient documentation

## 2017-10-21 DIAGNOSIS — E119 Type 2 diabetes mellitus without complications: Secondary | ICD-10-CM | POA: Insufficient documentation

## 2017-10-21 DIAGNOSIS — Z86718 Personal history of other venous thrombosis and embolism: Secondary | ICD-10-CM | POA: Diagnosis not present

## 2017-10-21 DIAGNOSIS — S61411D Laceration without foreign body of right hand, subsequent encounter: Secondary | ICD-10-CM | POA: Diagnosis not present

## 2017-10-21 DIAGNOSIS — Z4800 Encounter for change or removal of nonsurgical wound dressing: Secondary | ICD-10-CM | POA: Diagnosis not present

## 2017-10-21 DIAGNOSIS — Z79899 Other long term (current) drug therapy: Secondary | ICD-10-CM | POA: Diagnosis not present

## 2017-10-21 DIAGNOSIS — Z7901 Long term (current) use of anticoagulants: Secondary | ICD-10-CM | POA: Insufficient documentation

## 2017-10-21 MED ORDER — OXYCODONE-ACETAMINOPHEN 5-325 MG PO TABS: 1.0000 | ORAL_TABLET | Freq: Four times a day (QID) | ORAL | 0 refills | Status: DC | PRN

## 2017-10-21 MED ORDER — BACITRACIN ZINC 500 UNIT/GM EX OINT: 1.0000 "application " | TOPICAL_OINTMENT | Freq: Two times a day (BID) | CUTANEOUS | 0 refills | Status: DC

## 2017-10-21 NOTE — Assessment & Plan Note (Signed)
Encouraged heart healthy diet, increase exercise, avoid trans fats, consider a krill oil cap daily 

## 2017-10-21 NOTE — ED Triage Notes (Signed)
Pt had right hand injury 3 days ago. She is here for wound check. States wound is draining

## 2017-10-21 NOTE — ED Provider Notes (Signed)
[facility] EMERGENCY DEPARTMENT Provider Note   CSN: [id number] Arrival date & time: 10/21/17  1612     History   Chief Complaint Chief Complaint  Patient presents with  . Wound Check    HPI [name] Little is a 84 y.o. female.  HPI She is here for wound check on a wound that occurred 3 days ago.  Patient had a crush injury of the hand on farm equipment.  Laceration was repaired in the emergency department.  She was placed on Keflex which she is taking.  Has been elevating as instructed.  She is changing the dressing twice daily.  Patient reports that the wound is painful but less so today than yesterday.  She has had a small amount of bloody drainage.  She does have a fairly large amount of swelling from bruising with Coumadin.  Patient where she wants to make sure that is not getting infected.  She is also concerned about slight whitish appearance of the skin on one lacerations. Past Medical History:  Diagnosis Date  . Anxiety state, unspecified 09/22/2013  . Arthritis    lumbar stenosis, radiculopathy, OA- L hip  . BCC (basal cell carcinoma), scalp/neck 09/27/2015  . Blood clotting disorder (HCC)    V5 factor  . Blood dyscrasia    factor 5 deficiency  . Breast cancer ([place]) 2010   RIGHT breast  . Clostridium difficile infection   . Clotting disorder ([place])   . Diabetes mellitus type 2 in obese ([place]) 02/26/2014  . DVT (deep venous thrombosis) ([place])   . Factor V deficiency ([place])   . Hemorrhoids   . Hyperglycemia 02/26/2014  . Hyperlipemia   . Hypertension   . Infiltrating ductal carcinoma of breast ([place])    stage one right   . Low back pain 03/26/2014  . Macular degeneration   . Mitral valve regurgitation 09/01/2016  . Mixed hyperlipidemia 05/18/2015  . Nocturia 02/26/2014  . Paroxysmal supraventricular tachycardia ([place])   . Personal history of colonic polyps    hyperplastic  . Thoracic aortic aneurysm ([place])   . TMJ pain dysfunction syndrome     Patient Active  Problem List   Diagnosis Date Noted  . Kidney stones 09/17/2017  . Aortic atherosclerosis ([place]) 08/18/2017  . Mitral valve regurgitation 09/01/2016  . History of UTI 08/22/2016  . Lower extremity edema 08/22/2016  . BCC (basal cell carcinoma), scalp/neck 09/27/2015  . Pain in limb 05/30/2015  . Low back pain 03/26/2014  . Diabetes mellitus type 2 in obese ([place]) 02/26/2014  . Nocturia 02/26/2014  . Obesity (BMI 30-39.9) 02/03/2014  . Anxiety state 09/22/2013  . FH: factor V Leiden mutation 08/19/2013  . Preventative health care 04/02/2013  . Encounter for therapeutic drug monitoring 03/26/2013  . Anticoagulant long-term use 08/23/2010  . CALLUS, TOE 01/05/2010  . CHEST PAIN 08/23/2009  . hx: breast cancer, IDC, right LOQ, receptor + her 2 - 07/27/2009  . PHLEBITIS&THROMBOPHLEB SUP VESSELS LOWER EXTREM 12/11/2007  . HEMORRHOIDS 12/09/2007  . COLONIC POLYPS, HX OF 12/09/2007  . Essential hypertension 09/18/2007  . Venous (peripheral) insufficiency 07/02/2007  . Hyperlipidemia 06/04/2007  . Paroxysmal supraventricular tachycardia ([place]) 03/28/2007  . Aneurysm of thoracic aorta ([place]) 11/25/2006  . DVT, HX OF 08/11/2006    Past Surgical History:  Procedure Laterality Date  . ABDOMINAL HYSTERECTOMY     1960- ? vaginal hysterectomy, not laparoscopic   . BREAST LUMPECTOMY  2010   right Dr. [name]  . COLONOSCOPY    .  EYE SURGERY     bilateral cataracts- /w IOL   . JOINT REPLACEMENT     R hip replacement   . LAPAROSCOPIC HYSTERECTOMY    . LUMBAR LAMINECTOMY/DECOMPRESSION MICRODISCECTOMY  04/11/2011   Procedure: LUMBAR LAMINECTOMY/DECOMPRESSION MICRODISCECTOMY;  Surgeon: [name], MD;  Location: [facility];  Service: Neurosurgery;  Laterality: N/A;  Lumbar Five-Sacral One Microdiscectomy  . right calf surgery     for cancer  . SPLENECTOMY    . TOTAL HIP ARTHROPLASTY  1999   right  . UPPER GASTROINTESTINAL ENDOSCOPY       OB History   None      Home Medications     Prior to Admission medications   Medication Sig Start Date End Date Taking? Authorizing Provider  ALPRAZolam Duanne Moron) 0.25 MG tablet TAKE ONE TABLET BY MOUTH TWICE DIALY AS NEEDED FOR ANXIETY 06/15/17   Mosie Lukes, MD  B Complex Vitamins (VITAMIN B COMPLEX PO) Take 1 tablet by mouth daily.     [provider]  bacitracin ointment Apply 1 application topically 2 (two) times daily. 10/21/17   Charlesetta Shanks, MD  Blood Glucose Monitoring Suppl (ONE TOUCH ULTRA 2) w/Device KIT See admin instructions. 07/25/16   [provider]  cephALEXin (KEFLEX) 500 MG capsule Take 1 capsule (500 mg total) by mouth 4 (four) times daily for 7 days. 10/18/17 10/25/17  Cur[place]olo, Adam, DO  furosemide (LASIX) 20 MG tablet Take 1 tablet (20 mg total) by mouth daily as needed for fluid or edema. 08/22/16   Ann Held, DO  glucose blood (IGLUCOSE TEST STRIPS) test strip Test Blood Sugars every day as needed 07/25/16   Mosie Lukes, MD  glucose monitoring kit (FREESTYLE) monitoring kit 1 each by Does not apply route as needed for other. 07/25/16   Mosie Lukes, MD  Lancets MISC Test Blood Sugars every day as needed 07/25/16   Mosie Lukes, MD  losartan (COZAAR) 100 MG tablet Take 1 tablet (100 mg total) by mouth daily. 09/17/17   Mosie Lukes, MD  Multiple Vitamins-Minerals (MULTIVITAMIN ADULTS) TABS Take 1 tablet by mouth daily.    [provider]  oxyCODONE-acetaminophen (PERCOCET) 5-325 MG tablet Take 1-2 tablets by mouth every 6 (six) hours as needed for moderate pain. 10/21/17   Charlesetta Shanks, MD  potassium chloride SA (K-DUR,KLOR-CON) 20 MEQ tablet Take 1 tablet (20 mEq total) by mouth daily as needed (lasix use). 12/28/15   Mosie Lukes, MD  warfarin (COUMADIN) 5 MG tablet Take 1 tablet (5 mg) by mouth daily except on Mon Take 0.5 tablet (2.5 mg) 05/01/17   Mosie Lukes, MD    Family History Family History  Problem Relation Age of Onset  . Heart disease Mother    . Heart disease Father   . Alzheimer's disease Sister   . Heart disease Unknown        uncles  . Clotting disorder Maternal Uncle   . Breast cancer Paternal Aunt   . Cancer Paternal Aunt        breast  . Colon cancer Neg Hx   . Anesthesia problems Neg Hx   . Hypotension Neg Hx   . Malignant hyperthermia Neg Hx   . Pseudochol deficiency Neg Hx     Social History Social History   Tobacco Use  . Smoking status: Never Smoker  . Smokeless tobacco: Never Used  Substance Use Topics  . Alcohol use: No    Alcohol/week: 0.0 standard  drinks  . Drug use: No     Allergies   Hydralazine; Amlodipine; Carvedilol; Cefuroxime axetil; Chlorthalidone; Statins; and Sulfonamide derivatives   Review of Systems Review of Systems Constitutional: No fever no chills or malaise Respiratory: No shortness of breath no chest pain. Physical Exam Updated Vital Signs BP (!) 178/83   Pulse 88   Temp 98.6 F (37 C) (Oral)   Resp 18   Ht 5' 3" (1.6 m)   Wt 78.5 kg   SpO2 95%   BMI 30.65 kg/m   Physical Exam  Constitutional: She is oriented to person, place, and time.  Patient is alert and clinically well in appearance.  No distress.  HENT:  Head: Normocephalic and atraumatic.  Eyes: EOM are normal.  Pulmonary/Chest: Effort normal.  Musculoskeletal:  Wound is healing.  Moderate diffuse swelling with ecchymosis of the lateral aspect of the right hand where the lacerations are.  Lacerations are not showing signs of dehiscence.  Patient has very modest amount of serosanguineous drainage from the wound.  Nothing has purulent appearance.  Feel amount of erythema is consistent with inflammatory reaction from swelling due to bruising from Coumadin  Neurological: She is alert and oriented to person, place, and time. She exhibits normal muscle tone. Coordination normal.  Skin: Skin is warm and dry.  Psychiatric: She has a normal mood and affect.           ED Treatments / Results  Labs (all  labs ordered are listed, but only abnormal results are displayed) Labs Reviewed - No data to display  EKG None  Radiology No results found.  Procedures Procedures (including critical care time)  Medications Ordered in ED Medications - No data to display   Initial Impression / Assessment and Plan / ED Course  I have reviewed the triage vital signs and the nursing notes.  Pertinent labs & imaging results that were available during my care of the patient were reviewed by me and considered in my medical decision making (see chart for details).      Final Clinical Impressions(s) / ED Diagnoses   Final diagnoses:  Encounter for wound re-check  At this time, swelling appears consistent with expected from crush injury with Coumadin.  There appears to be some inflammatory reaction but at this time I doubt infection.  Patient has been taking Keflex as prescribed.  He has good range of motion of the fingers without pain.  No sign of tenosynovitis.  Patient will continue to elevate and continue her Keflex as prescribed.  She is prescribed Percocet to take 1-2 if needed for pain.  Patient is counseled to have close follow-up in the early stages of wound healing.  If any changes she is to return to the emergency department.  ED Discharge Orders         Ordered    bacitracin ointment  2 times daily     10/21/17 1737    oxyCODONE-acetaminophen (PERCOCET) 5-325 MG tablet  Every 6 hours PRN     10/21/17 1737           Charlesetta Shanks, MD 10/21/17 [phone]

## 2017-10-21 NOTE — Assessment & Plan Note (Signed)
Elevate feet, minimize sodium, compression hose prn

## 2017-10-21 NOTE — Discharge Instructions (Signed)
1.  Continue wound care as you have been and apply bacitracin twice daily with wound dressing changes. 2.  Continue elevating as much as possible. 3.  You may take 1-2 Percocet if needed for pain.  You have any tendency towards constipation, take a stool softener while you are taking the Percocet as it can be constipating. 4.  Early stages of healing, frequent wound checks are appropriate for this type of injury.  Try to see your doctor for recheck in 2 to 3 days.  If you have drainage that looks like pus, increasing swelling, worsening pain, fever or other concerns return to the emergency department for recheck.

## 2017-10-21 NOTE — Assessment & Plan Note (Signed)
hgba1c acceptable, minimize simple carbs. Increase exercise as tolerated. Continue current meds 

## 2017-10-21 NOTE — Assessment & Plan Note (Signed)
Well controlled, no changes to meds. Encouraged heart healthy diet such as the DASH diet and exercise as tolerated. impoved on recheck reports BP ranging from 132-156/72-91.

## 2017-10-22 ENCOUNTER — Telehealth: Payer: Self-pay | Admitting: Family Medicine

## 2017-10-22 NOTE — Telephone Encounter (Signed)
Patient granddaughter is calling to see if she can gest a hosp F/u within 2-3 days. Please have Dr. [name] to advise. [place] granddaughter [phone]

## 2017-10-22 NOTE — Telephone Encounter (Signed)
Copied from [place] [id number]. Topic: Quick Communication - See Telephone Encounter >> Oct 22, 2017  7:36 AM Gardiner Ramus wrote: CRM for notification. See Telephone encounter for: 10/22/17. Pt Granddaughter [place] called and stated that she had to take patient back to emergency room over the weekend. She took the patient back to the ER because her had was oozing yellow liquid along with blood from getting hand smashed in between farm equipment. [place] states that the ER doctor told them they needed to get in with PCP with in 2 to 3 days. Would like to make sure hand is not infected .ER doctor stated that she is at risk for infection because of all the mediation she is on. I could not get her scheduled with Dr. Charlett Blake within the next 2 to 3 days. Could we schedule with a different provider or work her in? [place] would like to know as soon as possible because she will have to mover her schedule around. Please advise [place] Cb#[phone]

## 2017-10-23 NOTE — Telephone Encounter (Signed)
Daughter wants to know if the patient can see [facility] for hospital follow up because the patient has drainage from her hand and "cell deterioration".

## 2017-10-24 ENCOUNTER — Ambulatory Visit: Payer: Medicare Other | Admitting: Internal Medicine

## 2017-10-24 ENCOUNTER — Encounter: Payer: Self-pay | Admitting: Internal Medicine

## 2017-10-24 VITALS — BP 120/64 | HR 69 | Temp 97.9°F | Resp 16 | Ht 63.0 in | Wt 173.1 lb

## 2017-10-24 DIAGNOSIS — S61411D Laceration without foreign body of right hand, subsequent encounter: Secondary | ICD-10-CM | POA: Diagnosis not present

## 2017-10-24 DIAGNOSIS — Z5189 Encounter for other specified aftercare: Secondary | ICD-10-CM | POA: Diagnosis not present

## 2017-10-24 DIAGNOSIS — Z7901 Long term (current) use of anticoagulants: Secondary | ICD-10-CM

## 2017-10-24 LAB — POCT INR: INR: 1.9 — AB (ref 2.0–3.0)

## 2017-10-24 MED ORDER — DOXYCYCLINE HYCLATE 100 MG PO TABS: 100.0000 mg | ORAL_TABLET | Freq: Two times a day (BID) | ORAL | 0 refills | Status: DC

## 2017-10-24 NOTE — Progress Notes (Signed)
Subjective:    Patient ID: [name] Little, female    DOB: 01-29-1934, 84 y.o.   MRN: [id number]  DOS:  10/24/2017 Type of visit - description :  Interval history: Went to the ER 10/18/2017, had a right hand injury, x-rays show no bony abnormality, she had a laceration repair, had a Tdap. They noted that her neurovascular exam was intact. She was prescribed Keflex for possible contamination.  Went to the ER again 10/21/2017, family was concerned about infection. They are here with the same concern.  Review of Systems The patient on the granddaughter reports that overall the swelling and pain was decreasing but yesterday they noted more swelling at the fifth finger and  more pain. The patient is taking Tylenol and so far has declined to use oxycodone. They have noted for the first time some yellow discharge mixed with the blood that he has been seeing from time to time. The area feels more warm.  But she actually has no fever. No other complications such as nausea, vomiting or diarrhea.  Past Medical History:  Diagnosis Date  . Anxiety state, unspecified 09/22/2013  . Arthritis    lumbar stenosis, radiculopathy, OA- L hip  . BCC (basal cell carcinoma), scalp/neck 09/27/2015  . Blood clotting disorder (HCC)    V5 factor  . Blood dyscrasia    factor 5 deficiency  . Breast cancer ([place]) 2010   RIGHT breast  . Clostridium difficile infection   . Clotting disorder ([place])   . Diabetes mellitus type 2 in obese ([place]) 02/26/2014  . DVT (deep venous thrombosis) ([place])   . Factor V deficiency ([place])   . Hemorrhoids   . Hyperglycemia 02/26/2014  . Hyperlipemia   . Hypertension   . Infiltrating ductal carcinoma of breast ([place])    stage one right   . Low back pain 03/26/2014  . Macular degeneration   . Mitral valve regurgitation 09/01/2016  . Mixed hyperlipidemia 05/18/2015  . Nocturia 02/26/2014  . Paroxysmal supraventricular tachycardia ([place])   . Personal history of colonic polyps    hyperplastic  . Thoracic aortic aneurysm ([place])   . TMJ pain dysfunction syndrome     Past Surgical History:  Procedure Laterality Date  . ABDOMINAL HYSTERECTOMY     1960- ? vaginal hysterectomy, not laparoscopic   . BREAST LUMPECTOMY  2010   right Dr. [name]  . COLONOSCOPY    . EYE SURGERY     bilateral cataracts- /w IOL   . JOINT REPLACEMENT     R hip replacement   . LAPAROSCOPIC HYSTERECTOMY    . LUMBAR LAMINECTOMY/DECOMPRESSION MICRODISCECTOMY  04/11/2011   Procedure: LUMBAR LAMINECTOMY/DECOMPRESSION MICRODISCECTOMY;  Surgeon: [name], MD;  Location: [facility];  Service: Neurosurgery;  Laterality: N/A;  Lumbar Five-Sacral One Microdiscectomy  . right calf surgery     for cancer  . SPLENECTOMY    . TOTAL HIP ARTHROPLASTY  1999   right  . UPPER GASTROINTESTINAL ENDOSCOPY      Social History   Socioeconomic History  . Marital status: Married    Spouse name: Not on file  . Number of children: 4  . Years of education: Not on file  . Highest education level: Not on file  Occupational History  . Occupation: Retired  Scientific laboratory technician  . Financial resource strain: Not on file  . Food insecurity:    Worry: Not on file    Inability: Not on file  . Transportation needs:    Medical: Not on  file    Non-medical: Not on file  Tobacco Use  . Smoking status: Never Smoker  . Smokeless tobacco: Never Used  Substance and Sexual Activity  . Alcohol use: No    Alcohol/week: 0.0 standard drinks  . Drug use: No  . Sexual activity: Never    Partners: Male  Lifestyle  . Physical activity:    Days per week: Not on file    Minutes per session: Not on file  . Stress: Not on file  Relationships  . Social connections:    Talks on phone: Not on file    Gets together: Not on file    Attends religious service: Not on file    Active member of club or organization: Not on file    Attends meetings of clubs or organizations: Not on file    Relationship status: Not on file  .  Intimate partner violence:    Fear of current or ex partner: Not on file    Emotionally abused: Not on file    Physically abused: Not on file    Forced sexual activity: Not on file  Other Topics Concern  . Not on file  Social History Narrative   Married 1955   2 sons - '59, '61, 2 daughters '55, '57; 8 grandchildren; 1 great grand   I-ADLs         Allergies as of 10/24/2017      Reactions   Hydralazine Other (See Comments)   Joint and chest pain   Amlodipine Swelling   "my whole body swelled"   Carvedilol Swelling   "my whole body swelled"   Cefuroxime Axetil    REACTION: Ddoes not remember reaction   Chlorthalidone    Patient reported extreme weakness.   Statins Other (See Comments)   Weakness, pain Lipitor and Pravachol Weakness, pain Lipitor and Pravachol   Sulfonamide Derivatives    REACTION: Rash      Medication List        Accurate as of 10/24/17 11:59 PM. Always use your most recent med list.          ALPRAZolam 0.25 MG tablet Commonly known as:  XANAX TAKE ONE TABLET BY MOUTH TWICE DIALY AS NEEDED FOR ANXIETY   bacitracin ointment Apply 1 application topically 2 (two) times daily.   doxycycline 100 MG tablet Commonly known as:  VIBRA-TABS Take 1 tablet (100 mg total) by mouth 2 (two) times daily.   furosemide 20 MG tablet Commonly known as:  LASIX Take 1 tablet (20 mg total) by mouth daily as needed for fluid or edema.   glucose blood test strip Test Blood Sugars every day as needed   glucose monitoring kit monitoring kit 1 each by Does not apply route as needed for other.   ONE TOUCH ULTRA 2 w/Device Kit See admin instructions.   Lancets Misc Test Blood Sugars every day as needed   losartan 100 MG tablet Commonly known as:  COZAAR Take 1 tablet (100 mg total) by mouth daily.   MULTIVITAMIN ADULTS Tabs Take 1 tablet by mouth daily.   oxyCODONE-acetaminophen 5-325 MG tablet Commonly known as:  PERCOCET/ROXICET Take 1-2 tablets by  mouth every 6 (six) hours as needed for moderate pain.   potassium chloride SA 20 MEQ tablet Commonly known as:  K-DUR,KLOR-CON Take 1 tablet (20 mEq total) by mouth daily as needed (lasix use).   VITAMIN B COMPLEX PO Take 1 tablet by mouth daily.   warfarin 5 MG tablet Commonly known  as:  COUMADIN Take as directed by the anticoagulation clinic. If you are unsure how to take this medication, talk to your nurse or doctor. Original instructions:  Take 1 tablet (5 mg) by mouth daily except on Mon Take 0.5 tablet (2.5 mg)          Objective:   Physical Exam BP 120/64 (BP Location: Left Arm, Patient Position: Sitting, Cuff Size: Small)   Pulse 69   Temp 97.9 F (36.6 C) (Oral)   Resp 16   Ht '5\' 3"'  (1.6 m)   Wt 173 lb 2 oz (78.5 kg)   SpO2 98%   BMI 30.67 kg/m    General:   Well developed, NAD, see BMI.  HEENT:  Normocephalic . Face symmetric, atraumatic Right hand: See pictures. The wound is indeed slightly red particularly at the dorsal aspect.  There is no abscess or pockets of pus but I did see some yellow mix with the bloody oozing. The wound itself does not seems to be completely healed. Good cap refills Neurologic:  alert & oriented X3.  Speech normal, gait appropriate for age and unassisted Psych--  Cognition and judgment appear intact.  Cooperative with normal attention span and concentration.  Behavior appropriate. No anxious or depressed appearing.     111      Assessment & Plan:   84 year old female, PMH includes mitral regurgitation, HTN, DM, high cholesterol, hypercoagulability on Coumadin, anxiety.  Hand laceration: See picture, indeed it might be getting infected.  I think she needs a specialized care. Plan: Change cephalexin to doxycycline Urgent referral to hand surgery If she is worse or unable to see the hand specialist in a timely manner, needs to go to the ER Addendum: spoke w/ Dr Amedeo Plenty, we agreed he will see her at the office 10/29/17 at  1pm If tomorrow Thursday  she is acutely worse, she could be seen urgently then  [facility] to [place] the d-daughter, asked for a call back Addendum: Spoke w/ [place] , aware of above, contact numbers for DrGramig She verbalized understanding Coumadin management: INR today is 1.9, slightly low.  For now continue the same medications and get a Coumadin check next week patient patient verbalized understanding and reports she follow-up with my advice.  Today, I spent more than  50 min  min with the patient: >50% of the time counseling regards wound management, making multiple phone calls and coordinating her care

## 2017-10-24 NOTE — Progress Notes (Signed)
Pre visit review using our clinic review tool, if applicable. No additional management support is needed unless otherwise documented below in the visit note. 

## 2017-10-24 NOTE — Telephone Encounter (Signed)
Grandaughter ([place]) wanted to schedule 2nd ED follow up with Dr. Carollee Herter. See note dated 10/23/17. Appointment scheduled.

## 2017-10-24 NOTE — Patient Instructions (Signed)
Stop cephalexin  Start doxycycline  Keep the  hand elevated.  If you have fever, chills, increased swelling, pain or discharge: Go to the ER  I am trying to get you seen by the orthopedic doctor this week.

## 2017-10-26 ENCOUNTER — Encounter (HOSPITAL_COMMUNITY): Payer: Self-pay | Admitting: *Deleted

## 2017-10-26 ENCOUNTER — Inpatient Hospital Stay (HOSPITAL_COMMUNITY): Payer: Medicare Other | Admitting: Anesthesiology

## 2017-10-26 ENCOUNTER — Emergency Department (HOSPITAL_COMMUNITY): Payer: Medicare Other

## 2017-10-26 ENCOUNTER — Encounter (HOSPITAL_COMMUNITY)
Admission: EM | Disposition: A | Discharge status: Home / Self Care | Payer: Self-pay | Source: Home / Self Care | Attending: Emergency Medicine

## 2017-10-26 ENCOUNTER — Other Ambulatory Visit: Payer: Self-pay

## 2017-10-26 ENCOUNTER — Observation Stay (HOSPITAL_COMMUNITY)
Admission: EM | Admit: 2017-10-26 | Discharge: 2017-10-27 | Disposition: A | Discharge status: Home / Self Care | Payer: Medicare Other | Attending: Internal Medicine | Admitting: Internal Medicine

## 2017-10-26 ENCOUNTER — Ambulatory Visit: Payer: Medicare Other | Admitting: Family Medicine

## 2017-10-26 DIAGNOSIS — Z85828 Personal history of other malignant neoplasm of skin: Secondary | ICD-10-CM | POA: Diagnosis not present

## 2017-10-26 DIAGNOSIS — I10 Essential (primary) hypertension: Secondary | ICD-10-CM | POA: Diagnosis present

## 2017-10-26 DIAGNOSIS — C50919 Malignant neoplasm of unspecified site of unspecified female breast: Secondary | ICD-10-CM | POA: Diagnosis present

## 2017-10-26 DIAGNOSIS — M79641 Pain in right hand: Secondary | ICD-10-CM | POA: Diagnosis not present

## 2017-10-26 DIAGNOSIS — Z882 Allergy status to sulfonamides status: Secondary | ICD-10-CM | POA: Diagnosis not present

## 2017-10-26 DIAGNOSIS — S61411A Laceration without foreign body of right hand, initial encounter: Secondary | ICD-10-CM

## 2017-10-26 DIAGNOSIS — Z79899 Other long term (current) drug therapy: Secondary | ICD-10-CM | POA: Insufficient documentation

## 2017-10-26 DIAGNOSIS — L089 Local infection of the skin and subcutaneous tissue, unspecified: Secondary | ICD-10-CM

## 2017-10-26 DIAGNOSIS — I340 Nonrheumatic mitral (valve) insufficiency: Secondary | ICD-10-CM | POA: Diagnosis present

## 2017-10-26 DIAGNOSIS — I471 Supraventricular tachycardia, unspecified: Secondary | ICD-10-CM | POA: Diagnosis present

## 2017-10-26 DIAGNOSIS — E785 Hyperlipidemia, unspecified: Secondary | ICD-10-CM | POA: Insufficient documentation

## 2017-10-26 DIAGNOSIS — S6721XA Crushing injury of right hand, initial encounter: Secondary | ICD-10-CM | POA: Diagnosis not present

## 2017-10-26 DIAGNOSIS — E119 Type 2 diabetes mellitus without complications: Secondary | ICD-10-CM | POA: Diagnosis not present

## 2017-10-26 DIAGNOSIS — W230XXA Caught, crushed, jammed, or pinched between moving objects, initial encounter: Secondary | ICD-10-CM | POA: Diagnosis not present

## 2017-10-26 DIAGNOSIS — Z86718 Personal history of other venous thrombosis and embolism: Secondary | ICD-10-CM | POA: Diagnosis not present

## 2017-10-26 DIAGNOSIS — L02511 Cutaneous abscess of right hand: Secondary | ICD-10-CM | POA: Insufficient documentation

## 2017-10-26 DIAGNOSIS — I712 Thoracic aortic aneurysm, without rupture: Secondary | ICD-10-CM | POA: Diagnosis not present

## 2017-10-26 DIAGNOSIS — M8580 Other specified disorders of bone density and structure, unspecified site: Secondary | ICD-10-CM | POA: Diagnosis not present

## 2017-10-26 DIAGNOSIS — E669 Obesity, unspecified: Secondary | ICD-10-CM | POA: Diagnosis present

## 2017-10-26 DIAGNOSIS — Z853 Personal history of malignant neoplasm of breast: Secondary | ICD-10-CM | POA: Diagnosis not present

## 2017-10-26 DIAGNOSIS — Z794 Long term (current) use of insulin: Secondary | ICD-10-CM | POA: Insufficient documentation

## 2017-10-26 DIAGNOSIS — Z7901 Long term (current) use of anticoagulants: Secondary | ICD-10-CM | POA: Insufficient documentation

## 2017-10-26 DIAGNOSIS — S6990XA Unspecified injury of unspecified wrist, hand and finger(s), initial encounter: Secondary | ICD-10-CM | POA: Diagnosis present

## 2017-10-26 DIAGNOSIS — T148XXA Other injury of unspecified body region, initial encounter: Secondary | ICD-10-CM

## 2017-10-26 DIAGNOSIS — E1169 Type 2 diabetes mellitus with other specified complication: Secondary | ICD-10-CM | POA: Diagnosis present

## 2017-10-26 HISTORY — PX: I & D EXTREMITY: SHX5045

## 2017-10-26 LAB — TSH: TSH: 3.676 u[IU]/mL (ref 0.350–4.500)

## 2017-10-26 LAB — CBC
HCT: 43.2 % (ref 36.0–46.0)
Hemoglobin: 13.8 g/dL (ref 12.0–15.0)
MCH: 30 pg (ref 26.0–34.0)
MCHC: 31.9 g/dL (ref 30.0–36.0)
MCV: 93.9 fL (ref 78.0–100.0)
Platelets: 357 10*3/uL (ref 150–400)
RBC: 4.6 MIL/uL (ref 3.87–5.11)
RDW: 14.6 % (ref 11.5–15.5)
WBC: 8.2 10*3/uL (ref 4.0–10.5)

## 2017-10-26 LAB — COMPREHENSIVE METABOLIC PANEL
ALT: 14 U/L (ref 0–44)
AST: 20 U/L (ref 15–41)
Albumin: 3.5 g/dL (ref 3.5–5.0)
Alkaline Phosphatase: 62 U/L (ref 38–126)
Anion gap: 8 (ref 5–15)
BUN: 17 mg/dL (ref 8–23)
CO2: 24 mmol/L (ref 22–32)
Calcium: 9 mg/dL (ref 8.9–10.3)
Chloride: 107 mmol/L (ref 98–111)
Creatinine, Ser: 0.84 mg/dL (ref 0.44–1.00)
GFR calc Af Amer: >60 mL/min (ref >60)
GFR calc non Af Amer: >60 mL/min (ref >60)
Glucose, Bld: 113 mg/dL — ABNORMAL HIGH (ref 70–99)
Potassium: 3.9 mmol/L (ref 3.5–5.1)
Sodium: 139 mmol/L (ref 135–145)
Total Bilirubin: 0.7 mg/dL (ref 0.3–1.2)
Total Protein: 6.9 g/dL (ref 6.5–8.1)

## 2017-10-26 LAB — HEMOGLOBIN A1C
Hgb A1c MFr Bld: 6.4 % — ABNORMAL HIGH (ref 4.8–5.6)
Mean Plasma Glucose: 136.98 mg/dL

## 2017-10-26 LAB — PROTIME-INR
INR: 1.71
Prothrombin Time: 19.9 seconds — ABNORMAL HIGH (ref 11.4–15.2)

## 2017-10-26 LAB — GLUCOSE, CAPILLARY: Glucose-Capillary: 105 mg/dL — ABNORMAL HIGH (ref 70–99)

## 2017-10-26 SURGERY — IRRIGATION AND DEBRIDEMENT EXTREMITY
Anesthesia: General | Site: Hand | Laterality: Right

## 2017-10-26 MED ORDER — SODIUM CHLORIDE 0.9 % IV SOLN
INTRAVENOUS | Status: DC | PRN
  Administered 2017-10-26: 19:00:00 via INTRAVENOUS

## 2017-10-26 MED ORDER — ONDANSETRON HCL 4 MG/2ML IJ SOLN: 4.0000 mg | Freq: Once | INTRAMUSCULAR | Status: DC | PRN

## 2017-10-26 MED ORDER — LOSARTAN POTASSIUM 50 MG PO TABS
100.0000 mg | ORAL_TABLET | Freq: Every day | ORAL | Status: DC
  Administered 2017-10-26 – 2017-10-27 (×2): 100 mg via ORAL
  Filled 2017-10-26 (×2): qty 2

## 2017-10-26 MED ORDER — ONDANSETRON HCL 4 MG/2ML IJ SOLN
INTRAMUSCULAR | Status: AC
  Filled 2017-10-26: qty 2

## 2017-10-26 MED ORDER — VANCOMYCIN HCL 500 MG IV SOLR
500.0000 mg | INTRAVENOUS | Status: DC
  Filled 2017-10-26: qty 500

## 2017-10-26 MED ORDER — FENTANYL CITRATE (PF) 100 MCG/2ML IJ SOLN
25.0000 ug | INTRAMUSCULAR | Status: DC | PRN
  Administered 2017-10-26: 25 ug via INTRAVENOUS

## 2017-10-26 MED ORDER — SUFENTANIL CITRATE 50 MCG/ML IV SOLN
INTRAVENOUS | Status: AC
  Filled 2017-10-26: qty 1

## 2017-10-26 MED ORDER — ALPRAZOLAM 0.25 MG PO TABS: 0.2500 mg | ORAL_TABLET | Freq: Every day | ORAL | Status: DC | PRN

## 2017-10-26 MED ORDER — ONDANSETRON HCL 4 MG PO TABS: 4.0000 mg | ORAL_TABLET | Freq: Four times a day (QID) | ORAL | Status: DC | PRN

## 2017-10-26 MED ORDER — ACETAMINOPHEN 325 MG PO TABS: 650.0000 mg | ORAL_TABLET | Freq: Four times a day (QID) | ORAL | Status: DC | PRN

## 2017-10-26 MED ORDER — DEXAMETHASONE SODIUM PHOSPHATE 10 MG/ML IJ SOLN
INTRAMUSCULAR | Status: AC
  Filled 2017-10-26: qty 1

## 2017-10-26 MED ORDER — GLYCOPYRROLATE 0.2 MG/ML IJ SOLN
INTRAMUSCULAR | Status: DC | PRN
  Administered 2017-10-26: 0.2 mg via INTRAVENOUS

## 2017-10-26 MED ORDER — ONDANSETRON HCL 4 MG/2ML IJ SOLN
INTRAMUSCULAR | Status: DC | PRN
  Administered 2017-10-26: 4 mg via INTRAVENOUS

## 2017-10-26 MED ORDER — VANCOMYCIN HCL IN DEXTROSE 1-5 GM/200ML-% IV SOLN
1000.0000 mg | Freq: Once | INTRAVENOUS | Status: AC
  Administered 2017-10-26: 1000 mg via INTRAVENOUS
  Filled 2017-10-26: qty 200

## 2017-10-26 MED ORDER — ACETAMINOPHEN 650 MG RE SUPP: 650.0000 mg | Freq: Four times a day (QID) | RECTAL | Status: DC | PRN

## 2017-10-26 MED ORDER — HYDROCODONE-ACETAMINOPHEN 5-325 MG PO TABS: 1.0000 | ORAL_TABLET | ORAL | Status: DC | PRN

## 2017-10-26 MED ORDER — LABETALOL HCL 5 MG/ML IV SOLN
INTRAVENOUS | Status: AC
  Filled 2017-10-26: qty 4

## 2017-10-26 MED ORDER — SUFENTANIL CITRATE 50 MCG/ML IV SOLN
INTRAVENOUS | Status: DC | PRN
  Administered 2017-10-26 (×2): 10 ug via INTRAVENOUS

## 2017-10-26 MED ORDER — DEXAMETHASONE SODIUM PHOSPHATE 10 MG/ML IJ SOLN
INTRAMUSCULAR | Status: DC | PRN
  Administered 2017-10-26: 10 mg via INTRAVENOUS

## 2017-10-26 MED ORDER — VANCOMYCIN HCL IN DEXTROSE 750-5 MG/150ML-% IV SOLN
750.0000 mg | Freq: Two times a day (BID) | INTRAVENOUS | Status: DC
  Administered 2017-10-27: 750 mg via INTRAVENOUS
  Filled 2017-10-26 (×2): qty 150

## 2017-10-26 MED ORDER — PIPERACILLIN-TAZOBACTAM 3.375 G IVPB
3.3750 g | Freq: Three times a day (TID) | INTRAVENOUS | Status: DC
  Administered 2017-10-27: 3.375 g via INTRAVENOUS
  Filled 2017-10-26 (×2): qty 50

## 2017-10-26 MED ORDER — LABETALOL HCL 5 MG/ML IV SOLN
10.0000 mg | INTRAVENOUS | Status: DC | PRN
  Administered 2017-10-27: 10 mg via INTRAVENOUS
  Filled 2017-10-26 (×2): qty 4

## 2017-10-26 MED ORDER — PROPOFOL 10 MG/ML IV BOLUS
INTRAVENOUS | Status: AC
  Filled 2017-10-26: qty 20

## 2017-10-26 MED ORDER — OXYCODONE HCL 5 MG PO TABS
5.0000 mg | ORAL_TABLET | Freq: Once | ORAL | Status: AC
  Administered 2017-10-26: 5 mg via ORAL
  Filled 2017-10-26: qty 1

## 2017-10-26 MED ORDER — SODIUM CHLORIDE 0.9 % IV SOLN
INTRAVENOUS | Status: DC
  Administered 2017-10-26: 22:00:00 via INTRAVENOUS

## 2017-10-26 MED ORDER — ONDANSETRON HCL 4 MG/2ML IJ SOLN: 4.0000 mg | Freq: Four times a day (QID) | INTRAMUSCULAR | Status: DC | PRN

## 2017-10-26 MED ORDER — PROPOFOL 10 MG/ML IV BOLUS
INTRAVENOUS | Status: DC | PRN
  Administered 2017-10-26: 150 mg via INTRAVENOUS

## 2017-10-26 MED ORDER — DIPHENHYDRAMINE HCL 50 MG/ML IJ SOLN
25.0000 mg | Freq: Once | INTRAMUSCULAR | Status: DC
  Filled 2017-10-26: qty 1

## 2017-10-26 MED ORDER — SODIUM CHLORIDE 0.9 % IR SOLN
Status: DC | PRN
  Administered 2017-10-26: 1000 mL
  Administered 2017-10-26: 3000 mL

## 2017-10-26 MED ORDER — LABETALOL HCL 5 MG/ML IV SOLN
5.0000 mg | INTRAVENOUS | Status: DC | PRN
  Administered 2017-10-26: 5 mg via INTRAVENOUS

## 2017-10-26 MED ORDER — VANCOMYCIN HCL IN DEXTROSE 750-5 MG/150ML-% IV SOLN: 750.0000 mg | Freq: Two times a day (BID) | INTRAVENOUS | Status: DC

## 2017-10-26 MED ORDER — LIDOCAINE HCL (CARDIAC) PF 100 MG/5ML IV SOSY
PREFILLED_SYRINGE | INTRAVENOUS | Status: DC | PRN
  Administered 2017-10-26: 100 mg via INTRATRACHEAL

## 2017-10-26 MED ORDER — LIDOCAINE 2% (20 MG/ML) 5 ML SYRINGE
INTRAMUSCULAR | Status: AC
  Filled 2017-10-26: qty 5

## 2017-10-26 MED ORDER — LACTATED RINGERS IV SOLN: INTRAVENOUS | Status: DC

## 2017-10-26 MED ORDER — FENTANYL CITRATE (PF) 100 MCG/2ML IJ SOLN
INTRAMUSCULAR | Status: AC
  Filled 2017-10-26: qty 2

## 2017-10-26 MED ORDER — GLYCOPYRROLATE PF 0.2 MG/ML IJ SOSY
PREFILLED_SYRINGE | INTRAMUSCULAR | Status: AC
  Filled 2017-10-26: qty 1

## 2017-10-26 MED ORDER — SODIUM CHLORIDE 0.9 % IJ SOLN
INTRAMUSCULAR | Status: AC
  Filled 2017-10-26: qty 10

## 2017-10-26 MED ORDER — PIPERACILLIN-TAZOBACTAM 3.375 G IVPB 30 MIN
3.3750 g | Freq: Once | INTRAVENOUS | Status: AC
  Administered 2017-10-26: 3.375 g via INTRAVENOUS
  Filled 2017-10-26: qty 50

## 2017-10-26 SURGICAL SUPPLY — 57 items
BANDAGE ACE 3X5.8 VEL STRL LF (GAUZE/BANDAGES/DRESSINGS) ×1 IMPLANT
BANDAGE ACE 4X5 VEL STRL LF (GAUZE/BANDAGES/DRESSINGS) ×3 IMPLANT
BNDG CMPR 9X4 STRL LF SNTH (GAUZE/BANDAGES/DRESSINGS) ×1
BNDG COHESIVE 1X5 TAN STRL LF (GAUZE/BANDAGES/DRESSINGS) IMPLANT
BNDG CONFORM 2 STRL LF (GAUZE/BANDAGES/DRESSINGS) IMPLANT
BNDG ESMARK 4X9 LF (GAUZE/BANDAGES/DRESSINGS) ×3 IMPLANT
BNDG GAUZE ELAST 4 BULKY (GAUZE/BANDAGES/DRESSINGS) ×3 IMPLANT
CORDS BIPOLAR (ELECTRODE) ×3 IMPLANT
COVER SURGICAL LIGHT HANDLE (MISCELLANEOUS) ×3 IMPLANT
CUFF TOURNIQUET SINGLE 18IN (TOURNIQUET CUFF) ×3 IMPLANT
CUFF TOURNIQUET SINGLE 24IN (TOURNIQUET CUFF) IMPLANT
DRAIN PENROSE 1/4X12 LTX STRL (WOUND CARE) IMPLANT
DRAPE SURG 17X23 STRL (DRAPES) ×3 IMPLANT
DRSG ADAPTIC 3X8 NADH LF (GAUZE/BANDAGES/DRESSINGS) ×3 IMPLANT
ELECT REM PT RETURN 9FT ADLT (ELECTROSURGICAL)
ELECTRODE REM PT RTRN 9FT ADLT (ELECTROSURGICAL) IMPLANT
GAUZE SPONGE 4X4 12PLY STRL (GAUZE/BANDAGES/DRESSINGS) ×3 IMPLANT
GAUZE XEROFORM 1X8 LF (GAUZE/BANDAGES/DRESSINGS) ×1 IMPLANT
GAUZE XEROFORM 5X9 LF (GAUZE/BANDAGES/DRESSINGS) IMPLANT
GLOVE BIOGEL PI IND STRL 8.5 (GLOVE) ×1 IMPLANT
GLOVE BIOGEL PI INDICATOR 8.5 (GLOVE) ×2
GLOVE SURG ORTHO 8.0 STRL STRW (GLOVE) ×3 IMPLANT
GOWN STRL REUS W/ TWL LRG LVL3 (GOWN DISPOSABLE) ×3 IMPLANT
GOWN STRL REUS W/ TWL XL LVL3 (GOWN DISPOSABLE) ×1 IMPLANT
GOWN STRL REUS W/TWL LRG LVL3 (GOWN DISPOSABLE) ×9
GOWN STRL REUS W/TWL XL LVL3 (GOWN DISPOSABLE) ×3
HANDPIECE INTERPULSE COAX TIP (DISPOSABLE)
KIT BASIN OR (CUSTOM PROCEDURE TRAY) ×3 IMPLANT
KIT TURNOVER KIT B (KITS) ×3 IMPLANT
MANIFOLD NEPTUNE II (INSTRUMENTS) ×3 IMPLANT
NDL HYPO 25GX1X1/2 BEV (NEEDLE) IMPLANT
NEEDLE HYPO 25GX1X1/2 BEV (NEEDLE) IMPLANT
NS IRRIG 1000ML POUR BTL (IV SOLUTION) ×3 IMPLANT
PACK ORTHO EXTREMITY (CUSTOM PROCEDURE TRAY) ×3 IMPLANT
PAD ARMBOARD 7.5X6 YLW CONV (MISCELLANEOUS) ×6 IMPLANT
PAD CAST 3X4 CTTN HI CHSV (CAST SUPPLIES) IMPLANT
PAD CAST 4YDX4 CTTN HI CHSV (CAST SUPPLIES) ×1 IMPLANT
PADDING CAST COTTON 3X4 STRL (CAST SUPPLIES)
PADDING CAST COTTON 4X4 STRL (CAST SUPPLIES) ×6
SET CYSTO W/LG BORE CLAMP LF (SET/KITS/TRAYS/PACK) ×2 IMPLANT
SET HNDPC FAN SPRY TIP SCT (DISPOSABLE) IMPLANT
SOAP 2 % CHG 4 OZ (WOUND CARE) ×3 IMPLANT
SPONGE LAP 18X18 X RAY DECT (DISPOSABLE) ×1 IMPLANT
SPONGE LAP 4X18 RFD (DISPOSABLE) ×1 IMPLANT
SUT ETHILON 4 0 PS 2 18 (SUTURE) IMPLANT
SUT ETHILON 5 0 P 3 18 (SUTURE)
SUT NYLON ETHILON 5-0 P-3 1X18 (SUTURE) IMPLANT
SUT PROLENE 4 0 PS 2 18 (SUTURE) ×4 IMPLANT
SWAB COLLECTION DEVICE MRSA (MISCELLANEOUS) ×1 IMPLANT
SWAB CULTURE ESWAB REG 1ML (MISCELLANEOUS) IMPLANT
SYR CONTROL 10ML LL (SYRINGE) IMPLANT
TOWEL OR 17X24 6PK STRL BLUE (TOWEL DISPOSABLE) ×3 IMPLANT
TOWEL OR 17X26 10 PK STRL BLUE (TOWEL DISPOSABLE) ×3 IMPLANT
TUBE CONNECTING 12'X1/4 (SUCTIONS) ×1
TUBE CONNECTING 12X1/4 (SUCTIONS) ×2 IMPLANT
UNDERPAD 30X30 (UNDERPADS AND DIAPERS) ×3 IMPLANT
YANKAUER SUCT BULB TIP NO VENT (SUCTIONS) ×1 IMPLANT

## 2017-10-26 NOTE — Transfer of Care (Signed)
Immediate Anesthesia Transfer of Care Note  Patient: [name] Little  Procedure(s) Performed: IRRIGATION AND DEBRIDEMENT RIGHT HAND (Right Hand)  Patient Location: PACU  Anesthesia Type:General  Level of Consciousness: awake, alert , oriented and patient cooperative  Airway & Oxygen Therapy: Patient Spontanous Breathing and Patient connected to nasal cannula oxygen  Post-op Assessment: Report given to RN, Post -op Vital signs reviewed and stable and Patient moving all extremities X 4  Post vital signs: Reviewed and stable  Last Vitals:  Vitals Value Taken Time  BP 174/105 10/26/2017  7:41 PM  Temp    Pulse 110 10/26/2017  7:47 PM  Resp 13 10/26/2017  7:47 PM  SpO2 98 % 10/26/2017  7:47 PM  Vitals shown include unvalidated device data.  Last Pain:  Vitals:   10/26/17 1152  TempSrc:   PainSc: 9          Complications: No apparent anesthesia complications

## 2017-10-26 NOTE — Anesthesia Preprocedure Evaluation (Addendum)
Anesthesia Evaluation  Patient identified by MRN, date of birth, ID band Patient awake    Reviewed: Allergy & Precautions, NPO status , Patient's Chart, lab work & pertinent test results  Airway Mallampati: II  TM Distance: >3 FB Neck ROM: Full    Dental  (+) Chipped,    Pulmonary neg pulmonary ROS,    Pulmonary exam normal breath sounds clear to auscultation       Cardiovascular hypertension, Pt. on medications + DVT  Normal cardiovascular exam+ Valvular Problems/Murmurs MR  Rhythm:Regular Rate:Normal  ECG: SR, PAC's rate 71  ECHO: Left ventricle: The cavity size was normal. There was mild concentric hypertrophy. Systolic function was normal. The estimated ejection fraction was in the range of 55% to 60%. Wall motion was normal; there were no regional wall motion abnormalities. Aortic root: The aortic root was mildly dilated. Mitral valve: There was mild to moderate regurgitation. Left atrium: The atrium was mildly dilated.  Sees cardiologist Johnsie Cancel)   Neuro/Psych Anxiety negative neurological ROS     GI/Hepatic negative GI ROS, Neg liver ROS,   Endo/Other  diabetes  Renal/GU      Musculoskeletal negative musculoskeletal ROS (+)   Abdominal (+) + obese,   Peds  Hematology  (+) Blood dyscrasia, , Factor V deficiency HLD   Anesthesia Other Findings Right hand infection  Reproductive/Obstetrics                            Anesthesia Physical Anesthesia Plan  ASA: III and emergent  Anesthesia Plan: General   Post-op Pain Management:    Induction: Intravenous  PONV Risk Score and Plan: 3 and Ondansetron, Dexamethasone and Treatment may vary due to age or medical condition  Airway Management Planned: LMA  Additional Equipment:   Intra-op Plan:   Post-operative Plan: Extubation in OR  Informed Consent: I have reviewed the patients History and Physical, chart, labs and  discussed the procedure including the risks, benefits and alternatives for the proposed anesthesia with the patient or authorized representative who has indicated his/her understanding and acceptance.   Dental advisory given  Plan Discussed with: CRNA  Anesthesia Plan Comments:        Anesthesia Quick Evaluation

## 2017-10-26 NOTE — Consult Note (Signed)
Reason for Consult:Right hand infection  Referring Physician: Dr. Burnett Harry [name] Little is an 84 y.o. female.  HPI: Patient is 84 year old female with history of hypertension, hyperlipidemia, mitral regurgitation, history of DVT, on Coumadin, breast CA presented to the ED with worsening of the right hand infection.  Patient reports that she had cut her hand on a piece of farm equipment on 10/18/17.  She stated that her right hand got stuck in the hydraulic system of the farm equipment while she was helping her husband.  Patient is on Coumadin.  She was seen in ED at [facility] on the same day, she was given a tetanus shot, x-rays showed no acute fractures.  Wounds were extensively cleaned and sutured, patient was discharged on Keflex.   Patient returned back to ED on 9/22 with a concern for worsening of her wound with drainage.  Patient reported no fevers or chills.  Patient was continued on Keflex, neurovascular exam was intact.  She followed with her PCP on 9/25.  Patient reported that the swelling and the pain was improving however she noticed yellow drainage with blood from the area.  Patient was placed on doxycycline and Dr. Larose Kells referred for urgent hand surgery evaluation.she felt her pain was much worse today and returned back to ED.  Hand surgery was consulted by ED, plan for OR today  Past Medical History:  Diagnosis Date  . Anxiety state, unspecified 09/22/2013  . Arthritis    lumbar stenosis, radiculopathy, OA- L hip  . BCC (basal cell carcinoma), scalp/neck 09/27/2015  . Blood clotting disorder (HCC)    V5 factor  . Blood dyscrasia    factor 5 deficiency  . Breast cancer ([place]) 2010   RIGHT breast  . Clostridium difficile infection   . Clotting disorder ([place])   . Diabetes mellitus type 2 in obese ([place]) 02/26/2014  . DVT (deep venous thrombosis) ([place])   . Factor V deficiency ([place])   . Hemorrhoids   . Hyperglycemia 02/26/2014  . Hyperlipemia   . Hypertension   . 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 Low back pain 03/26/2014  . Macular degeneration   . Mitral valve regurgitation 09/01/2016  . Mixed hyperlipidemia 05/18/2015  . Nocturia 02/26/2014  . Paroxysmal supraventricular tachycardia ([place])   . Personal history of colonic polyps    hyperplastic  . Thoracic aortic aneurysm ([place])   . TMJ pain dysfunction syndrome     Past Surgical History:  Procedure Laterality Date  . ABDOMINAL HYSTERECTOMY     1960- ? vaginal hysterectomy, not laparoscopic   . BREAST LUMPECTOMY  2010   right Dr. [name]  . COLONOSCOPY    . EYE SURGERY     bilateral cataracts- /w IOL   . JOINT REPLACEMENT     R hip replacement   . LAPAROSCOPIC HYSTERECTOMY    . LUMBAR LAMINECTOMY/DECOMPRESSION MICRODISCECTOMY  04/11/2011   Procedure: LUMBAR LAMINECTOMY/DECOMPRESSION MICRODISCECTOMY;  Surgeon: [name], MD;  Location: [facility];  Service: Neurosurgery;  Laterality: N/A;  Lumbar Five-Sacral One Microdiscectomy  . right calf surgery     for cancer  . SPLENECTOMY    . TOTAL HIP ARTHROPLASTY  1999   right  . UPPER GASTROINTESTINAL ENDOSCOPY      Family History  Problem Relation Age of Onset  . Heart disease Mother   . Heart disease Father   . Alzheimer's disease Sister   . Heart disease Unknown  uncles  . Clotting disorder Maternal Uncle   . Breast cancer Paternal Aunt   . Cancer Paternal Aunt        breast  . Colon cancer Neg Hx   . Anesthesia problems Neg Hx   . Hypotension Neg Hx   . Malignant hyperthermia Neg Hx   . Pseudochol deficiency Neg Hx     Social History:  reports that she has never smoked. She has never used smokeless tobacco. She reports that she does not drink alcohol or use drugs.  Allergies:  Allergies  Allergen Reactions  . Hydralazine Other (See Comments)    Joint and chest pain  . Amlodipine Swelling    "my whole body swelled"  . Carvedilol Swelling    "my whole body swelled"  . Cefuroxime Axetil      REACTION: Ddoes not remember reaction  . Chlorthalidone     Patient reported extreme weakness.  . Statins Other (See Comments)    Weakness, pain Lipitor and Pravachol Weakness, pain Lipitor and Pravachol  . Sulfonamide Derivatives     REACTION: Rash    Medications: I have reviewed the patient's current medications.  Results for orders placed or performed during the hospital encounter of 10/26/17 (from the past 48 hour(s))  CBC     Status: None   Collection Time: 10/26/17  8:56 AM  Result Value Ref Range   WBC 8.2 4.0 - 10.5 K/uL   RBC 4.60 3.87 - 5.11 MIL/uL   Hemoglobin 13.8 12.0 - 15.0 g/dL   HCT 43.2 36.0 - 46.0 %   MCV 93.9 78.0 - 100.0 fL   MCH 30.0 26.0 - 34.0 pg   MCHC 31.9 30.0 - 36.0 g/dL   RDW 14.6 11.5 - 15.5 %   Platelets 357 150 - 400 K/uL    Comment: Performed at [facility] [address] [place], [place] [address]  Comprehensive metabolic panel     Status: Abnormal   Collection Time: 10/26/17  8:56 AM  Result Value Ref Range   Sodium 139 135 - 145 mmol/L   Potassium 3.9 3.5 - 5.1 mmol/L   Chloride 107 98 - 111 mmol/L   CO2 24 22 - 32 mmol/L   Glucose, Bld 113 (H) 70 - 99 mg/dL   BUN 17 8 - 23 mg/dL   Creatinine, Ser 0.84 0.44 - 1.00 mg/dL   Calcium 9.0 8.9 - 10.3 mg/dL   Total Protein 6.9 6.5 - 8.1 g/dL   Albumin 3.5 3.5 - 5.0 g/dL   AST 20 15 - 41 U/L   ALT 14 0 - 44 U/L   Alkaline Phosphatase 62 38 - 126 U/L   Total Bilirubin 0.7 0.3 - 1.2 mg/dL   GFR calc non Af Amer >60 >60 mL/min   GFR calc Af Amer >60 >60 mL/min    Comment: (NOTE) The eGFR has been calculated using the CKD EPI equation. This calculation has not been validated in all clinical situations. eGFR's persistently <60 mL/min signify possible Chronic Kidney Disease.    Anion gap 8 5 - 15    Comment: Performed at [facility] [address], [place], [place] [address]    [place] Lt Upper Extrem Ltd Soft Tissue Non Vascular  Result Date: 10/26/2017 CLINICAL DATA:  Hand  pain EXAM: ULTRASOUND RIGHT UPPER EXTREMITY LIMITED TECHNIQUE: Ultrasound examination of the upper extremity soft tissues was performed in the area of clinical concern. COMPARISON:  None FINDINGS: Imaging over the ulnar aspect  of the right hand along the palmar and dorsal aspect demonstrates subcutaneous nonspecific soft tissue edema more so along the palmar aspect with a more focal ill-defined hypoechoic focus along the dorsum of the hand measuring approximately 0.5 x 0.5 x 0.3 cm. No additional findings of note. IMPRESSION: Suspect soft tissue cellulitis accounting for the serpiginous areas of soft tissue edema along the palmar ulnar aspect of the hand. A smaller focus of ill-defined presumed fluid along the dorsum of the hand is identified measuring 0.5 x 0.5 x 0.3 cm. Differential considerations may include a small focus of infection/abscess or stigmata of posttraumatic change/hematoma. This is too small to further characterize. Electronically Signed   By: Ashley Royalty M.D.   On: 10/26/2017 13:57    ROSAs noted in the medical chart  Blood pressure (!) 199/81, pulse 64, temperature 98.6 F (37 C), temperature source Oral, resp. rate 16, SpO2 94 %. Physical Exam   On examination of the right hand the patient does have the dorsal and volar wounds. The volar wound does appear to be infected. There is fair amount of drainage from the volar wound. She is able to extend her thumb extend her digits she is able to cross her fingers her fingers are warm well perfused good capillary refill good blood flow  The soft tissue ultrasound did show the soft tissue edema along the palmar aspect of the hand and a fluid collection over the dorsal aspect of the hand  Assessment/Plan: Right hand crush injury with open wounds and early onset of infection  The plan today and discussed with the patient is for the patient undergo formal irrigation and debridement and loose wound reapproximation and splinting. Planned  procedure the operating room tonight. Risks of surgery include but not limited to bleeding infection damage to nerve nearby nerves arteries or tendons loss of motion of the wrists and digits incomplete relief of symptoms and need for further surgical intervention  R/B/A [place].  PT VOICED UNDERSTANDING OF PLAN CONSENT SIGNED DAY OF SURGERY PT SEEN AND EXAMINED PRIOR TO OPERATIVE PROCEDURE/DAY OF SURGERY SITE MARKED. QUESTIONS ANSWERED WILL BE ADMITTED TO MEDICINE SERVICE FOLLOWING SURGERY  Linna Hoff 10/26/2017, 6:45 PM

## 2017-10-26 NOTE — Anesthesia Procedure Notes (Signed)
Procedure Name: LMA Insertion Date/Time: 10/26/2017 7:02 PM Performed by: Claris Che, CRNA Pre-anesthesia Checklist: Patient identified, Emergency Drugs available, Suction available, Patient being monitored and Timeout performed Patient Re-evaluated:Patient Re-evaluated prior to induction Oxygen Delivery Method: Circle system utilized Preoxygenation: Pre-oxygenation with 100% oxygen Induction Type: IV induction Ventilation: Mask ventilation without difficulty LMA Size: 4.0 Number of attempts: 1 Placement Confirmation: positive ETCO2 and breath sounds checked- equal and bilateral Tube secured with: Tape Dental Injury: Teeth and Oropharynx as per pre-operative assessment

## 2017-10-26 NOTE — ED Provider Notes (Signed)
[facility] Emergency Department Provider Note MRN:  [id number]  Arrival date & time: 10/26/17     Chief Complaint   Hand Injury   History of Present Illness   [name] Little is a 84 y.o. year-old female with a history of DVT, diabetes presenting to the ED with chief complaint of hand injury.  Patient explains that on the 19th of this month, she got her hand caught in a piece of metallic farm machinery.  Sustained a crush injury, has been in the ED multiple times for repair and subsequent wound infection.  Continued drainage from the wound, increasing pain and swelling recently.  Advised by her hand specialist to come to the ED today for evaluation.  Pain is 10 out of 10 in severity when palpated.  Denies fevers, no chest pain or shortness of breath, no abdominal pain.  Review of Systems  A complete 10 system review of systems was obtained and all systems are negative except as noted in the HPI and PMH.   Patient's Health History    Past Medical History:  Diagnosis Date  . Anxiety state, unspecified 09/22/2013  . Arthritis    lumbar stenosis, radiculopathy, OA- L hip  . BCC (basal cell carcinoma), scalp/neck 09/27/2015  . Blood clotting disorder (HCC)    V5 factor  . Blood dyscrasia    factor 5 deficiency  . Breast cancer ([place]) 2010   RIGHT breast  . Clostridium difficile infection   . Clotting disorder ([place])   . Diabetes mellitus type 2 in obese ([place]) 02/26/2014  . DVT (deep venous thrombosis) ([place])   . Factor V deficiency ([place])   . Hemorrhoids   . Hyperglycemia 02/26/2014  . Hyperlipemia   . Hypertension   . Infiltrating ductal carcinoma of breast ([place])    stage one right   . Low back pain 03/26/2014  . Macular degeneration   . Mitral valve regurgitation 09/01/2016  . Mixed hyperlipidemia 05/18/2015  . Nocturia 02/26/2014  . Paroxysmal supraventricular tachycardia ([place])   . Personal history of colonic polyps    hyperplastic  . Thoracic aortic aneurysm  ([place])   . TMJ pain dysfunction syndrome     Past Surgical History:  Procedure Laterality Date  . ABDOMINAL HYSTERECTOMY     1960- ? vaginal hysterectomy, not laparoscopic   . BREAST LUMPECTOMY  2010   right Dr. [name]  . COLONOSCOPY    . EYE SURGERY     bilateral cataracts- /w IOL   . JOINT REPLACEMENT     R hip replacement   . LAPAROSCOPIC HYSTERECTOMY    . LUMBAR LAMINECTOMY/DECOMPRESSION MICRODISCECTOMY  04/11/2011   Procedure: LUMBAR LAMINECTOMY/DECOMPRESSION MICRODISCECTOMY;  Surgeon: [name], MD;  Location: [facility];  Service: Neurosurgery;  Laterality: N/A;  Lumbar Five-Sacral One Microdiscectomy  . right calf surgery     for cancer  . SPLENECTOMY    . TOTAL HIP ARTHROPLASTY  1999   right  . UPPER GASTROINTESTINAL ENDOSCOPY      Family History  Problem Relation Age of Onset  . Heart disease Mother   . Heart disease Father   . Alzheimer's disease Sister   . Heart disease Unknown        uncles  . Clotting disorder Maternal Uncle   . Breast cancer Paternal Aunt   . Cancer Paternal Aunt        breast  . Colon cancer Neg Hx   . Anesthesia problems Neg Hx   . Hypotension Neg  Hx   . Malignant hyperthermia Neg Hx   . Pseudochol deficiency Neg Hx     Social History   Socioeconomic History  . Marital status: Married    Spouse name: Not on file  . Number of children: 4  . Years of education: Not on file  . Highest education level: Not on file  Occupational History  . Occupation: Retired  Scientific laboratory technician  . Financial resource strain: Not on file  . Food insecurity:    Worry: Not on file    Inability: Not on file  . Transportation needs:    Medical: Not on file    Non-medical: Not on file  Tobacco Use  . Smoking status: Never Smoker  . Smokeless tobacco: Never Used  Substance and Sexual Activity  . Alcohol use: No    Alcohol/week: 0.0 standard drinks  . Drug use: No  . Sexual activity: Never    Partners: Male  Lifestyle  . Physical activity:     Days per week: Not on file    Minutes per session: Not on file  . Stress: Not on file  Relationships  . Social connections:    Talks on phone: Not on file    Gets together: Not on file    Attends religious service: Not on file    Active member of club or organization: Not on file    Attends meetings of clubs or organizations: Not on file    Relationship status: Not on file  . Intimate partner violence:    Fear of current or ex partner: Not on file    Emotionally abused: Not on file    Physically abused: Not on file    Forced sexual activity: Not on file  Other Topics Concern  . Not on file  Social History Narrative   Married 1955   2 sons - '59, '61, 2 daughters '55, '57; 8 grandchildren; 1 great grand   I-ADLs        Physical Exam  Vital Signs and Nursing Notes reviewed Vitals:   10/26/17 1145 10/26/17 1454  BP: (!) 180/81 (!) 187/70  Pulse: (!) 50 68  Resp: 16 16  Temp:    SpO2: 96% 99%    CONSTITUTIONAL: Well-appearing, NAD NEURO:  Alert and oriented x 3, no focal deficits EYES:  eyes equal and reactive ENT/NECK:  no LAD, no JVD CARDIO: Regular rate, well-perfused, normal S1 and S2 PULM:  CTAB no wheezing or rhonchi GI/GU:  normal bowel sounds, non-distended, non-tender MSK/SPINE:  No gross deformities, no edema SKIN:  no rash, loosely sutured wound of the ulnar aspect of the right hand on the volar and dorsal side, minimal surrounding erythema, minimal volar drainage PSYCH:  Appropriate speech and behavior  Diagnostic and Interventional Summary    EKG Interpretation  Date/Time:    Ventricular Rate:    PR Interval:    QRS Duration:   QT Interval:    QTC Calculation:   R Axis:     Text Interpretation:        Labs Reviewed  COMPREHENSIVE METABOLIC PANEL - Abnormal; Notable for the following components:      Result Value   Glucose, Bld 113 (*)    All other components within normal limits  CBC  PROTIME-INR    [place] LT UPPER EXTREM LTD SOFT TISSUE NON  VASCULAR  Final Result      Medications  vancomycin (VANCOCIN) IVPB 1000 mg/200 mL premix (1,000 mg Intravenous New Bag/Given 10/26/17 1540)  piperacillin-tazobactam (ZOSYN) IVPB 3.375 g (has no administration in time range)  piperacillin-tazobactam (ZOSYN) IVPB 3.375 g (has no administration in time range)  vancomycin (VANCOCIN) 500 mg in sodium chloride 0.9 % 100 mL IVPB (has no administration in time range)  vancomycin (VANCOCIN) IVPB 750 mg/150 ml premix (has no administration in time range)  oxyCODONE (Oxy IR/ROXICODONE) immediate release tablet 5 mg (5 mg Oral Given 10/26/17 0944)     Procedures Critical Care  ED Course and Medical Decision Making  I have reviewed the triage vital signs and the nursing notes.  Pertinent labs & imaging results that were available during my care of the patient were reviewed by me and considered in my medical decision making (see below for details).  Concern for wound infection in this 84 year old female, no systemic symptoms, here to see hand specialist.  Ultrasound concerning for cellulitis and possible fluid collection.  Patient was evaluated by Rayford Halsted of orthopedics, to be admitted to hospitalist service, to be brought to the OR for washout.  Given IV vancomycin here in the emergency department.   Barth Kirks. [place]onia Small, [place] [email or web address]  Final Clinical Impressions(s) / ED Diagnoses     ICD-10-CM   1. Wound infection T14.8XXA    L08.9   2. Hand pain, right M79.641 [place] LT UPPER EXTREM LTD SOFT TISSUE NON VASCULAR    [place] LT UPPER EXTREM LTD SOFT TISSUE NON VASCULAR    ED Discharge Orders    None         Maudie Flakes, MD 10/26/17 [phone]

## 2017-10-26 NOTE — ED Notes (Signed)
Patient transported to [facility] 

## 2017-10-26 NOTE — ED Notes (Signed)
Pt updated on wait for [place]

## 2017-10-26 NOTE — ED Triage Notes (Signed)
Pt in c/o continued problems with her right hand infection, pt has been being treated for this but pain worsened today, called Dr. Amedeo Plenty and was told he would meet them here in the ED

## 2017-10-26 NOTE — Op Note (Signed)
PREOPERATIVE DIAGNOSIS:right hand laceration with dorsal and volar abscess, early abscess collection  POSTOPERATIVE DIAGNOSIS:same  ATTENDING SURGEON:Dr. Gavin Pound who was scrubbed and present for the entire procedure  ASSISTANT SURGEON:none  ANESTHESIA:Gen. Via LMA  OPERATIVE PROCEDURE: #1: Excisional debridement of skin and subcutaneous tissue and devitalized tissue 4 x 4 centimeters volar 2 x 3 cm dorsal #2: Drainage of dorsal and palmar early abscess palmar bursa right hand #3: Traumatic laceration repair 4 cm volar aspect hand #4: Traumatic laceration repair 3 cm dorsal aspect of right hand  IMPLANTS:none  RADIOGRAPHIC INTERPRETATION:none  SURGICAL INDICATIONS:patient is a right-hand-dominant female who sustained a crushing injury to her right hand. Patient presented to the ER with worsening infection. Patient seen and evaluated and recommended undergo the above procedure. Risks of surgery include but not limited to bleeding infection damage to nearby nerves arteries or tendons loss of motion of the wrists and digits incomplete relief of symptoms and need for further surgical intervention  [place] is properly identified in preoperative holding area marked for permanent marker made on the right hand indicate correct upper site. Patient brought back to the operating placed supine on anesthesia and table where general anesthesia was administered. Preoperative antibiotics had been given. A well-padded tourniquet was then placed on the right brachium and sealed with the appropriate drape. The right M is and prepped and draped in normal sterile fashion. Timeout was called the correct site was identified and the procedure then begun. Attention then turned to the right hand. The traumatic lacerations on the volar and dorsal aspects were then opened up. Excisional debridement of the skin subtendinous tissue was then carried out of the devitalized tissue on the dorsal and volar  aspect of the hand with sharp scissors knife's. After removing the devitalized tissue the wound was then thoroughly irrigated. The early abscess on the dorsal and volar aspect of the hand was then thoroughly irrigated over the palmar bursa over the hypothenar eminence of the right hand. After thorough wound irrigation and excisional debridement traumatic lacerations on the volar dorsal aspect were then loosely reapproximated and closed with Prolene suture. Adaptic dressing and a sterile compressive bandage then applied. The patient is then placed in well-padded ulnar splint and extubated taken recovery room in good condition.  POSTOPERATIVE PLAN:the patient is be admitted back to the internal medicine service. I think is reasonable for her to go home tomorrow on oral antibiotic. I can see her back in the office in 72 hours for wound check and evaluation keep the splint on the entire time do not remove the splint keeping hand elevated limited use of the right upper extremity.

## 2017-10-26 NOTE — Progress Notes (Addendum)
Pharmacy Antibiotic Note  [name] Little is a 84 y.o. female admitted on 10/26/2017 with hand would infection.  Pharmacy has been consulted for Zosyn/vancomycin dosing. SCr 0.84 on admit. Patient with swelling reaction to cefuroxime documented but has tolerated Unasyn here in the past.  1x dose of vanc 1g IV given at in the ED - RN reports patient with symptoms consistent with Red Man Syndome. RN contacted MD and she is aware. Will lengthen infusion time per protocol.  Plan: Zosyn 3.375g IV (76min inf) x1; then 3.375g IV q8h (4h inf) Vancomycin 750mg  IV q12h (doses over 2hrs due to Red Man Syndrome) Monitor clinical progress, c/s, renal function F/u de-escalation plan/LOT, vancomycin trough as indicated     Temp (24hrs), Avg:98.6 F (37 C), Min:98.6 F (37 C), Max:98.6 F (37 C)  Recent Labs  Lab 10/26/17 0856  WBC 8.2  CREATININE 0.84    Estimated Creatinine Clearance: 52.1 mL/min (by C-G formula based on SCr of 0.84 mg/dL).    Allergies  Allergen Reactions  . Hydralazine Other (See Comments)    Joint and chest pain  . Amlodipine Swelling    "my whole body swelled"  . Carvedilol Swelling    "my whole body swelled"  . Cefuroxime Axetil     REACTION: Ddoes not remember reaction  . Chlorthalidone     Patient reported extreme weakness.  . Statins Other (See Comments)    Weakness, pain Lipitor and Pravachol Weakness, pain Lipitor and Pravachol  . Sulfonamide Derivatives     REACTION: Rash    Elicia Lamp, PharmD, BCPS Please check AMION for all [place] contact numbers Clinical Pharmacist 10/26/2017 4:16 PM

## 2017-10-26 NOTE — ED Notes (Signed)
Pt face, chest and back red after administration of vancomycin. Admitting doctor made aware

## 2017-10-26 NOTE — H&P (Signed)
History and Physical        Hospital Admission Note Date: 10/26/2017  Patient name: [name] Little Medical record number: [id number] Date of birth: [birth year] Age: 84 y.o. Gender: female  PCP: Mosie Lukes, MD    Patient coming from: Home  I have reviewed all records in the [facility].    Chief Complaint:  Right hand infection worsening  HPI: Patient is 84 year old female with history of hypertension, hyperlipidemia, mitral regurgitation, history of DVT, on Coumadin, breast CA presented to the ED with worsening of the right hand infection.  Patient reports that she had cut her hand on a piece of farm equipment on 10/18/17.  She stated that her right hand got stuck in the hydraulic system of the farm equipment while she was helping her husband.  Patient is on Coumadin.  She was seen in ED at [facility] on the same day, she was given a tetanus shot, x-rays showed no acute fractures.  Wounds were extensively cleaned and sutured, patient was discharged on Keflex.   Patient returned back to ED on 9/22 with a concern for worsening of her wound with drainage.  Patient reported no fevers or chills.  Patient was continued on Keflex, neurovascular exam was intact.  She followed with her PCP on 9/25.  Patient reported that the swelling and the pain was improving however she noticed yellow drainage with blood from the area.  Patient was placed on doxycycline and Dr. Larose Kells referred for urgent hand surgery evaluation.  Patient secured outpatient appointment with Dr. Amedeo Plenty however she felt her pain was much worse today and returned back to ED.  Hand surgery was consulted by ED, plan for OR today  ED work-up/course:  Temp 98.6, respiratory rate 20, pulse 71, BP 167/78 BMET sodium 139, BUN 17, creatinine 0.8 Hemoglobin 13.8, WBCs 8.2 Soft tissue ultrasound showed cellulitis and  soft tissue edema along the palmar ulnar aspect of the hand, smaller focus of ill-defined presumed fluid along the dorsum of the hand 0.5x 0.5x 0.3 cm   Review of Systems: Positives marked in 'bold' Constitutional: Denies fever, chills, diaphoresis, poor appetite and fatigue.  HEENT: Denies photophobia, eye pain, redness, hearing loss, ear pain, congestion, sore throat, rhinorrhea, sneezing, mouth sores, trouble swallowing, neck pain, neck stiffness and tinnitus.   Respiratory: Denies SOB, DOE, cough, chest tightness,  and wheezing.   Cardiovascular: Denies chest pain, palpitations and leg swelling.  Gastrointestinal: Denies nausea, vomiting, abdominal pain, diarrhea, constipation, blood in stool and abdominal distention.  Genitourinary: Denies dysuria, urgency, frequency, hematuria, flank pain and difficulty urinating.  Musculoskeletal: Denies myalgias, back pain, joint swelling, arthralgias and gait problem.  Skin: Please see HPI Neurological: Denies dizziness, seizures, syncope, weakness, light-headedness, numbness and headaches.  Hematological: Denies adenopathy. Easy bruising, personal or family bleeding history  Psychiatric/Behavioral: Denies suicidal ideation, mood changes, confusion, nervousness, sleep disturbance and agitation  Past Medical History: Past Medical History:  Diagnosis Date  . Anxiety state, unspecified 09/22/2013  . Arthritis    lumbar stenosis, radiculopathy, OA- L hip  . BCC (basal cell carcinoma), scalp/neck 09/27/2015  . Blood clotting disorder (HCC)    V5 factor  .  Blood dyscrasia    factor 5 deficiency  . Breast cancer ([place]) 2010   RIGHT breast  . Clostridium difficile infection   . Clotting disorder ([place])   . Diabetes mellitus type 2 in obese ([place]) 02/26/2014  . DVT (deep venous thrombosis) ([place])   . Factor V deficiency ([place])   . Hemorrhoids   . Hyperglycemia 02/26/2014  . Hyperlipemia   . Hypertension   . Infiltrating ductal carcinoma of breast ([place])      stage one right   . Low back pain 03/26/2014  . Macular degeneration   . Mitral valve regurgitation 09/01/2016  . Mixed hyperlipidemia 05/18/2015  . Nocturia 02/26/2014  . Paroxysmal supraventricular tachycardia ([place])   . Personal history of colonic polyps    hyperplastic  . Thoracic aortic aneurysm ([place])   . TMJ pain dysfunction syndrome     Past Surgical History:  Procedure Laterality Date  . ABDOMINAL HYSTERECTOMY     1960- ? vaginal hysterectomy, not laparoscopic   . BREAST LUMPECTOMY  2010   right Dr. [name]  . COLONOSCOPY    . EYE SURGERY     bilateral cataracts- /w IOL   . JOINT REPLACEMENT     R hip replacement   . LAPAROSCOPIC HYSTERECTOMY    . LUMBAR LAMINECTOMY/DECOMPRESSION MICRODISCECTOMY  04/11/2011   Procedure: LUMBAR LAMINECTOMY/DECOMPRESSION MICRODISCECTOMY;  Surgeon: [name], MD;  Location: [facility];  Service: Neurosurgery;  Laterality: N/A;  Lumbar Five-Sacral One Microdiscectomy  . right calf surgery     for cancer  . SPLENECTOMY    . TOTAL HIP ARTHROPLASTY  1999   right  . UPPER GASTROINTESTINAL ENDOSCOPY      Medications: Prior to Admission medications   Medication Sig Start Date End Date Taking? Authorizing Provider  ALPRAZolam (XANAX) 0.25 MG tablet TAKE ONE TABLET BY MOUTH TWICE DIALY AS NEEDED FOR ANXIETY Patient taking differently: Take 0.25 mg by mouth daily as needed for anxiety.  06/15/17  Yes Mosie Lukes, MD  B Complex Vitamins (VITAMIN B COMPLEX PO) Take 1 tablet by mouth daily.    Yes [provider]  bacitracin ointment Apply 1 application topically 2 (two) times daily. 10/21/17  Yes Charlesetta Shanks, MD  doxycycline (VIBRA-TABS) 100 MG tablet Take 1 tablet (100 mg total) by mouth 2 (two) times daily. Patient taking differently: Take 100 mg by mouth 2 (two) times daily. Has 10 doses remaining 10/24/17  Yes Paz, Alda Berthold, MD  furosemide (LASIX) 20 MG tablet Take 1 tablet (20 mg total) by mouth daily as needed for fluid or  edema. 08/22/16  Yes Roma Schanz R, DO  losartan (COZAAR) 100 MG tablet Take 1 tablet (100 mg total) by mouth daily. 09/17/17  Yes Mosie Lukes, MD  Multiple Vitamins-Minerals (MULTIVITAMIN ADULTS) TABS Take 1 tablet by mouth daily.   Yes [provider]  potassium chloride SA (K-DUR,KLOR-CON) 20 MEQ tablet Take 1 tablet (20 mEq total) by mouth daily as needed (lasix use). 12/28/15  Yes Mosie Lukes, MD  warfarin (COUMADIN) 5 MG tablet Take 1 tablet (5 mg) by mouth daily except on Mon Take 0.5 tablet (2.5 mg) Patient taking differently: Take 2.5-5 mg by mouth See admin instructions. Take 1 tablet (5 mg) by mouth daily except on Sun Take 0.5 tablet (2.5 mg) 05/01/17  Yes Mosie Lukes, MD  Blood Glucose Monitoring Suppl (ONE TOUCH ULTRA 2) w/Device KIT See admin instructions. 07/25/16   [provider]  glucose blood (IGLUCOSE TEST  STRIPS) test strip Test Blood Sugars every day as needed 07/25/16   Mosie Lukes, MD  glucose monitoring kit (FREESTYLE) monitoring kit 1 each by Does not apply route as needed for other. 07/25/16   Mosie Lukes, MD  Lancets MISC Test Blood Sugars every day as needed 07/25/16   Mosie Lukes, MD  oxyCODONE-acetaminophen (PERCOCET) 5-325 MG tablet Take 1-2 tablets by mouth every 6 (six) hours as needed for moderate pain. Patient not taking: Reported on 10/26/2017 10/21/17   Charlesetta Shanks, MD    Allergies:   Allergies  Allergen Reactions  . Hydralazine Other (See Comments)    Joint and chest pain  . Amlodipine Swelling    "my whole body swelled"  . Carvedilol Swelling    "my whole body swelled"  . Cefuroxime Axetil     REACTION: Ddoes not remember reaction  . Chlorthalidone     Patient reported extreme weakness.  . Statins Other (See Comments)    Weakness, pain Lipitor and Pravachol Weakness, pain Lipitor and Pravachol  . Sulfonamide Derivatives     REACTION: Rash    Social History:  reports that she has never smoked.  She has never used smokeless tobacco. She reports that she does not drink alcohol or use drugs.  Family History: Family History  Problem Relation Age of Onset  . Heart disease Mother   . Heart disease Father   . Alzheimer's disease Sister   . Heart disease Unknown        uncles  . Clotting disorder Maternal Uncle   . Breast cancer Paternal Aunt   . Cancer Paternal Aunt        breast  . Colon cancer Neg Hx   . Anesthesia problems Neg Hx   . Hypotension Neg Hx   . Malignant hyperthermia Neg Hx   . Pseudochol deficiency Neg Hx     Physical Exam: Blood pressure (!) 187/70, pulse 68, temperature 98.6 F (37 C), temperature source Oral, resp. rate 16, SpO2 99 %. General: Alert, awake, oriented x3, in no acute distress. Eyes: pink conjunctiva,anicteric sclera, pupils equal and reactive to light and accomodation, HEENT: normocephalic, atraumatic, oropharynx clear Neck: supple, no masses or lymphadenopathy, no goiter, no bruits, no JVD CVS: Regular rate and rhythm, + 2/6 systolic murmur  No lower extremity edema Resp : Clear to auscultation bilaterally, no wheezing, rales or rhonchi. GI : Soft, nontender, nondistended, positive bowel sounds, no masses. No hepatomegaly. No hernia.  Musculoskeletal: No clubbing or cyanosis, positive pedal pulses. No contracture. ROM intact  Neuro: Grossly intact, no focal neurological deficits, strength 5/5 upper and lower extremities bilaterally Psych: alert and oriented x 3, normal mood and affect Skin: Hand laceration on the right hand, sutured wound on the ulnar aspect of the right hand both on the volar and dorsal side with drainage and erythema   LABS on Admission: I have personally reviewed all the labs and imagings below    Basic Metabolic Panel: Recent Labs  Lab 10/26/17 0856  NA 139  K 3.9  CL 107  CO2 24  GLUCOSE 113*  BUN 17  CREATININE 0.84  CALCIUM 9.0   Liver Function Tests: Recent Labs  Lab 10/26/17 0856  AST 20  ALT 14   ALKPHOS 62  BILITOT 0.7  PROT 6.9  ALBUMIN 3.5   No results for input(s): LIPASE, AMYLASE in the last 168 hours. No results for input(s): AMMONIA in the last 168 hours. CBC: Recent Labs  Lab 10/26/17  0856  WBC 8.2  HGB 13.8  HCT 43.2  MCV 93.9  PLT 357   Cardiac Enzymes: No results for input(s): CKTOTAL, CKMB, CKMBINDEX, TROPONINI in the last 168 hours. BNP: Invalid input(s): POCBNP CBG: No results for input(s): GLUCAP in the last 168 hours.  Radiological Exams on Admission:  [place] Lt Upper Extrem Ltd Soft Tissue Non Vascular  Result Date: 10/26/2017 CLINICAL DATA:  Hand pain EXAM: ULTRASOUND RIGHT UPPER EXTREMITY LIMITED TECHNIQUE: Ultrasound examination of the upper extremity soft tissues was performed in the area of clinical concern. COMPARISON:  None FINDINGS: Imaging over the ulnar aspect of the right hand along the palmar and dorsal aspect demonstrates subcutaneous nonspecific soft tissue edema more so along the palmar aspect with a more focal ill-defined hypoechoic focus along the dorsum of the hand measuring approximately 0.5 x 0.5 x 0.3 cm. No additional findings of note. IMPRESSION: Suspect soft tissue cellulitis accounting for the serpiginous areas of soft tissue edema along the palmar ulnar aspect of the hand. A smaller focus of ill-defined presumed fluid along the dorsum of the hand is identified measuring 0.5 x 0.5 x 0.3 cm. Differential considerations may include a small focus of infection/abscess or stigmata of posttraumatic change/hematoma. This is too small to further characterize. Electronically Signed   By: Ashley Royalty M.D.   On: 10/26/2017 13:57      EKG: Independently reviewed.  None available   Assessment/Plan Principal Problem:   Laceration of right hand with infection: With underlying history of diabetes, has failed 2 courses of oral antibiotics, now with concern for abscess -Patient will be admitted, hand surgery has been consulted by EDP.  - NPO,  await orthopedics evaluation and per EDP, Dr Sedonia Small, plan for OR shortly for I&D -Placed on IV vancomycin and Zosyn, IV fluids, pain control  Active Problems: History of DVT -In the setting of known factor V [place] deficiency -Coumadin currently on hold for potential surgery -Obtain stat INR, Coumadin per pharmacy, will be started once cleared by orthopedics  History of malignant neoplasm of female breast ([place]), status post lumpectomy in 2010, infiltrating ductal CA, stage I -Outpatient follow-up with oncology    Essential hypertension -BP currently elevated, restart losartan, -Labetalol IV as needed with parameters for SBP above 175.  Patient has allergy to multiple antihypertensives.    Diabetes mellitus type 2 in obese (HCC) -Obtain hemoglobin A1c, placed on sliding scale insulin    DVT prophylaxis: Coumadin currently on hold for surgery today, will need to be restarted once cleared by orthopedics  CODE STATUS: Full CODE STATUS  Consults called: Hand surgery has been consulted by EDP  Family Communication: Admission, patients condition and plan of care including tests being ordered have been discussed with the patient, husband and daughter who indicates understanding and agree with the plan and Code Status  Admission status: Telemetry, inpatient  Disposition plan: Further plan will depend as patient's clinical course evolves and further radiologic and laboratory data become available.    At the time of admission, it appears that the appropriate admission status for this patient is INPATIENT . This is judged to be reasonable and necessary in order to provide the required intensity of service to ensure the patient's safety given the presenting symptoms right hand laceration with discharge, possible abscess, failed 2 courses of oral antibiotics, physical exam findings per above exam in the note, and initial radiographic and laboratory data in the context of their chronic  comorbidities.  The medical decision making on this  patient was of high complexity and the patient is at high risk for clinical deterioration, therefore this is a level 3 visit.   Time Spent on Admission: 73mns     Ripudeep Rai M.D. [facility] 10/26/2017, 4:26 PM Pager: [phone] If 7PM-7AM, please contact night-coverage [email or web address] Password [email or web address]

## 2017-10-27 ENCOUNTER — Other Ambulatory Visit: Payer: Self-pay

## 2017-10-27 DIAGNOSIS — M79641 Pain in right hand: Secondary | ICD-10-CM | POA: Diagnosis not present

## 2017-10-27 DIAGNOSIS — S61411D Laceration without foreign body of right hand, subsequent encounter: Secondary | ICD-10-CM | POA: Diagnosis not present

## 2017-10-27 DIAGNOSIS — E1169 Type 2 diabetes mellitus with other specified complication: Secondary | ICD-10-CM | POA: Diagnosis not present

## 2017-10-27 DIAGNOSIS — I10 Essential (primary) hypertension: Secondary | ICD-10-CM | POA: Diagnosis not present

## 2017-10-27 DIAGNOSIS — S6990XA Unspecified injury of unspecified wrist, hand and finger(s), initial encounter: Secondary | ICD-10-CM | POA: Diagnosis present

## 2017-10-27 LAB — BASIC METABOLIC PANEL
Anion gap: 11 (ref 5–15)
BUN: 15 mg/dL (ref 8–23)
CO2: 23 mmol/L (ref 22–32)
Calcium: 9.2 mg/dL (ref 8.9–10.3)
Chloride: 105 mmol/L (ref 98–111)
Creatinine, Ser: 0.81 mg/dL (ref 0.44–1.00)
GFR calc Af Amer: >60 mL/min (ref >60)
GFR calc non Af Amer: >60 mL/min (ref >60)
Glucose, Bld: 181 mg/dL — ABNORMAL HIGH (ref 70–99)
Potassium: 4.2 mmol/L (ref 3.5–5.1)
Sodium: 139 mmol/L (ref 135–145)

## 2017-10-27 LAB — CBC
HCT: 43.8 % (ref 36.0–46.0)
Hemoglobin: 13.9 g/dL (ref 12.0–15.0)
MCH: 29.9 pg (ref 26.0–34.0)
MCHC: 31.7 g/dL (ref 30.0–36.0)
MCV: 94.2 fL (ref 78.0–100.0)
Platelets: 358 10*3/uL (ref 150–400)
RBC: 4.65 MIL/uL (ref 3.87–5.11)
RDW: 14.6 % (ref 11.5–15.5)
WBC: 7.2 10*3/uL (ref 4.0–10.5)

## 2017-10-27 LAB — GLUCOSE, CAPILLARY
Glucose-Capillary: 171 mg/dL — ABNORMAL HIGH (ref 70–99)
Glucose-Capillary: 166 mg/dL — ABNORMAL HIGH (ref 70–99)

## 2017-10-27 LAB — HEMOGLOBIN A1C
Hgb A1c MFr Bld: 6.6 % — ABNORMAL HIGH (ref 4.8–5.6)
Mean Plasma Glucose: 142.72 mg/dL

## 2017-10-27 MED ORDER — HYDROCODONE-ACETAMINOPHEN 5-325 MG PO TABS: 1.0000 | ORAL_TABLET | Freq: Four times a day (QID) | ORAL | 0 refills | Status: AC | PRN

## 2017-10-27 MED ORDER — WARFARIN SODIUM 7.5 MG PO TABS: 7.5000 mg | ORAL_TABLET | Freq: Once | ORAL | Status: DC

## 2017-10-27 MED ORDER — MORPHINE SULFATE (PF) 2 MG/ML IV SOLN: 1.0000 mg | INTRAVENOUS | Status: DC | PRN

## 2017-10-27 MED ORDER — DOXYCYCLINE HYCLATE 100 MG PO TABS: 100.0000 mg | ORAL_TABLET | Freq: Two times a day (BID) | ORAL | 0 refills | Status: DC

## 2017-10-27 MED ORDER — ONDANSETRON 4 MG PO TBDP: 4.0000 mg | ORAL_TABLET | Freq: Three times a day (TID) | ORAL | 0 refills | Status: DC | PRN

## 2017-10-27 MED ORDER — INSULIN ASPART 100 UNIT/ML ~~LOC~~ SOLN
0.0000 [IU] | SUBCUTANEOUS | Status: DC
  Administered 2017-10-27 (×2): 2 [IU] via SUBCUTANEOUS

## 2017-10-27 MED ORDER — WARFARIN - PHARMACIST DOSING INPATIENT: Freq: Every day | Status: DC

## 2017-10-27 MED ORDER — AMOXICILLIN-POT CLAVULANATE 875-125 MG PO TABS: 1.0000 | ORAL_TABLET | Freq: Two times a day (BID) | ORAL | 0 refills | Status: DC

## 2017-10-27 NOTE — Care Management CC44 (Signed)
Condition Code 44 Documentation Completed  Patient Details  Name: [name] Little MRN: [id number] Date of Birth: May 13, 1933   Condition Code 44 given:  Yes Patient signature on Condition Code 44 notice:  Yes Documentation of 2 MD's agreement:  Yes Code 44 added to claim:  Yes    Claudie Leach, RN 10/27/2017, 12:17 PM

## 2017-10-27 NOTE — Discharge Summary (Signed)
Physician Discharge Summary   Patient ID: [name] Little MRN: [id number] DOB/AGE: [birth year] 84 y.o.  Admit date: 10/26/2017 Discharge date: 10/27/2017  Primary Care Physician:  Mosie Lukes, MD   Recommendations for Outpatient Follow-up:  1. Patient recommended to follow-up with hand surgery on Monday as recommended 2. Continue elevation, splint  Home Health: None  Equipment/Devices:   Discharge Condition: stable  CODE STATUS: FULL  Diet recommendation: Heart modified diet  Discharge Diagnoses:    . Laceration of right hand with infection with early abscess, status post I&D . Diabetes mellitus type 2 in obese ([facility]) . Essential hypertension . Malignant neoplasm of female breast ([place]) . Mitral valve regurgitation . Paroxysmal supraventricular tachycardia (HCC)   Consults: Hand surgery    Allergies:   Allergies  Allergen Reactions  . Hydralazine Other (See Comments)    Joint and chest pain  . Amlodipine Swelling    "my whole body swelled"  . Carvedilol Swelling    "my whole body swelled"  . Cefuroxime Axetil     REACTION: Ddoes not remember reaction  . Chlorthalidone     Patient reported extreme weakness.  . Statins Other (See Comments)    Weakness, pain Lipitor and Pravachol Weakness, pain Lipitor and Pravachol  . Sulfonamide Derivatives     REACTION: Rash     DISCHARGE MEDICATIONS: Allergies as of 10/27/2017      Reactions   Hydralazine Other (See Comments)   Joint and chest pain   Amlodipine Swelling   "my whole body swelled"   Carvedilol Swelling   "my whole body swelled"   Cefuroxime Axetil    REACTION: Ddoes not remember reaction   Chlorthalidone    Patient reported extreme weakness.   Statins Other (See Comments)   Weakness, pain Lipitor and Pravachol Weakness, pain Lipitor and Pravachol   Sulfonamide Derivatives    REACTION: Rash      Medication List    STOP taking these medications   oxyCODONE-acetaminophen 5-325 MG  tablet Commonly known as:  PERCOCET/ROXICET     TAKE these medications   ALPRAZolam 0.25 MG tablet Commonly known as:  XANAX TAKE ONE TABLET BY MOUTH TWICE DIALY AS NEEDED FOR ANXIETY What changed:    how much to take  how to take this  when to take this  reasons to take this  additional instructions   amoxicillin-clavulanate 875-125 MG tablet Commonly known as:  AUGMENTIN Take 1 tablet by mouth 2 (two) times daily. Notes to patient:  Take first dose when you fill prescription   bacitracin ointment Apply 1 application topically 2 (two) times daily.   doxycycline 100 MG tablet Commonly known as:  VIBRA-TABS Take 1 tablet (100 mg total) by mouth 2 (two) times daily. What changed:  additional instructions Notes to patient:  Take first dose when you fill prescription   furosemide 20 MG tablet Commonly known as:  LASIX Take 1 tablet (20 mg total) by mouth daily as needed for fluid or edema.   glucose blood test strip Test Blood Sugars every day as needed   glucose monitoring kit monitoring kit 1 each by Does not apply route as needed for other.   ONE TOUCH ULTRA 2 w/Device Kit See admin instructions.   HYDROcodone-acetaminophen 5-325 MG tablet Commonly known as:  NORCO/VICODIN Take 1 tablet by mouth every 6 (six) hours as needed for up to 5 days for moderate pain or severe pain.   Lancets Misc Test Blood Sugars every day as needed  losartan 100 MG tablet Commonly known as:  COZAAR Take 1 tablet (100 mg total) by mouth daily.   MULTIVITAMIN ADULTS Tabs Take 1 tablet by mouth daily.   ondansetron 4 MG disintegrating tablet Commonly known as:  ZOFRAN-ODT Take 1 tablet (4 mg total) by mouth every 8 (eight) hours as needed for nausea or vomiting.   potassium chloride SA 20 MEQ tablet Commonly known as:  K-DUR,KLOR-CON Take 1 tablet (20 mEq total) by mouth daily as needed (lasix use).   VITAMIN B COMPLEX PO Take 1 tablet by mouth daily.   warfarin 5 MG  tablet Commonly known as:  COUMADIN Take as directed. If you are unsure how to take this medication, talk to your nurse or doctor. Original instructions:  Take 1 tablet (5 mg) by mouth daily except on Mon Take 0.5 tablet (2.5 mg) What changed:    how much to take  how to take this  when to take this  additional instructions        Brief H and P: For complete details please refer to admission H and P, but in briefPatient is 84 year old female with history of hypertension, hyperlipidemia, mitral regurgitation, history of DVT, on Coumadin, breast CA presented to the ED with worsening of the right hand infection.  Patient reports that she had cut her hand on a piece of farm equipment on 10/18/17.  She stated that her right hand got stuck in the hydraulic system of the farm equipment while she was helping her husband.  Patient is on Coumadin.  She was seen in ED at [facility] on the same day, she was given a tetanus shot, x-rays showed no acute fractures.  Wounds were extensively cleaned and sutured, patient was discharged on Keflex.   Patient returned back to ED on 9/22 with a concern for worsening of her wound with drainage.  Patient reported no fevers or chills.  Patient was continued on Keflex, neurovascular exam was intact.  She followed with her PCP on 9/25.  Patient reported that the swelling and the pain was improving however she noticed yellow drainage with blood from the area.  Patient was placed on doxycycline and Dr. Larose Kells referred for urgent hand surgery evaluation.  Patient secured outpatient appointment with Dr. Amedeo Plenty however she felt her pain was much worse today and returned back to ED.  Hand surgery was consulted by ED, underwent surgery on 9/27   Hospital Course:    Laceration of right hand with infection/early abscess: With underlying history of diabetes, has failed 2 courses of oral antibiotics, now with concern for abscess -Patient was placed on IV vancomycin and  Zosyn.  Hand surgery was consulted.  Patient underwent I&D on 9/27. -Per hand surgery recommendations, discharge patient on oral antibiotics for 2 weeks, outpatient follow-up with hand surgery in 72 hours for wound check -Given patient had failed Keflex and doxycycline was just started 2 days prior to admission.  Discussed with infectious disease, Dr. Graylon Good, recommended doxycycline to be continued and add Augmentin.   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  Patient will restart Coumadin.  History of malignant neoplasm of female breast ([place]), status post lumpectomy in 2010, infiltrating ductal CA, stage I -Outpatient follow-up with oncology    Essential hypertension -BP currently stable, continue losartan    Diabetes mellitus type 2 in obese (HCC) -Hemoglobin A1c 6.6  Day of Discharge S: Feels a lot better  today, dressing intact, denies any specific complaints.  BP (!) 123/55 (BP Location: Left Arm)   Pulse (!) 56   Temp (!) 97.3 F (36.3 C) (Oral)   Resp 15   SpO2 96%   Physical Exam: General: Alert and awake oriented x3 not in any acute distress. HEENT: anicteric sclera, pupils reactive to light and accommodation CVS: S1-S2 clear no murmur rubs or gallops Chest: clear to auscultation bilaterally, no wheezing rales or rhonchi Abdomen: soft nontender, nondistended, normal bowel sounds Extremities: no cyanosis, clubbing or edema noted bilaterally.  Right upper extremity dressing intact Neuro: Cranial nerves II-XII intact, no focal neurological deficits   The results of significant diagnostics from this hospitalization (including imaging, microbiology, ancillary and laboratory) are listed below for reference.      Procedures/Studies:  Dg Wrist Complete Right  Result Date: 10/18/2017 CLINICAL DATA:  Injury with open wound. EXAM: RIGHT WRIST - COMPLETE 3+ VIEW COMPARISON:  Left hand series same day.  FINDINGS: Soft tissue laceration and air noted adjacent to the right metacarpal. No radiopaque foreign body. No acute bony abnormality identified. No evidence of fracture dislocation. IMPRESSION: Soft tissue laceration air noted adjacent to the right fifth metacarpal. No radiopaque foreign body. No acute bony abnormality. Electronically Signed   By: Marcello Moores  Register   On: 10/18/2017 13:40   Dg Hand Complete Right  Result Date: 10/18/2017 CLINICAL DATA:  Injury with open wound. EXAM: RIGHT HAND - COMPLETE 3+ VIEW COMPARISON:  No recent prior. FINDINGS: Soft tissue laceration and air noted adjacent to the left fifth metacarpal. No radiopaque foreign body. No acute bony abnormality identified. No evidence of fracture dislocation. Diffuse osteopenia degenerative change. IMPRESSION: Soft tissue laceration air noted adjacent to the left fifth metacarpal. No radiopaque foreign body. No acute bony abnormality. Electronically Signed   By: Marcello Moores  Register   On: 10/18/2017 13:39   [place] Lt Upper Extrem Ltd Soft Tissue Non Vascular  Result Date: 10/26/2017 CLINICAL DATA:  Hand pain EXAM: ULTRASOUND RIGHT UPPER EXTREMITY LIMITED TECHNIQUE: Ultrasound examination of the upper extremity soft tissues was performed in the area of clinical concern. COMPARISON:  None FINDINGS: Imaging over the ulnar aspect of the right hand along the palmar and dorsal aspect demonstrates subcutaneous nonspecific soft tissue edema more so along the palmar aspect with a more focal ill-defined hypoechoic focus along the dorsum of the hand measuring approximately 0.5 x 0.5 x 0.3 cm. No additional findings of note. IMPRESSION: Suspect soft tissue cellulitis accounting for the serpiginous areas of soft tissue edema along the palmar ulnar aspect of the hand. A smaller focus of ill-defined presumed fluid along the dorsum of the hand is identified measuring 0.5 x 0.5 x 0.3 cm. Differential considerations may include a small focus of infection/abscess or  stigmata of posttraumatic change/hematoma. This is too small to further characterize. Electronically Signed   By: Ashley Royalty M.D.   On: 10/26/2017 13:57       LAB RESULTS: Basic Metabolic Panel: Recent Labs  Lab 10/26/17 0856 10/27/17 0409  NA 139 139  K 3.9 4.2  CL 107 105  CO2 24 23  GLUCOSE 113* 181*  BUN 17 15  CREATININE 0.84 0.81  CALCIUM 9.0 9.2   Liver Function Tests: Recent Labs  Lab 10/26/17 0856  AST 20  ALT 14  ALKPHOS 62  BILITOT 0.7  PROT 6.9  ALBUMIN 3.5   No results for input(s): LIPASE, AMYLASE in the last 168 hours. No results for input(s): AMMONIA in the last  168 hours. CBC: Recent Labs  Lab 10/26/17 0856 10/27/17 0409  WBC 8.2 7.2  HGB 13.8 13.9  HCT 43.2 43.8  MCV 93.9 94.2  PLT 357 358   Cardiac Enzymes: No results for input(s): CKTOTAL, CKMB, CKMBINDEX, TROPONINI in the last 168 hours. BNP: Invalid input(s): POCBNP CBG: Recent Labs  Lab 10/27/17 0317 10/27/17 0826  GLUCAP 171* 166*      Disposition and Follow-up: Discharge Instructions    Diet - low sodium heart healthy   Complete by:  As directed    Increase activity slowly   Complete by:  As directed        DISPOSITION: Home   DISCHARGE FOLLOW-UP Follow-up Information    Iran Planas, MD. Call on 10/29/2017.   Specialty:  Orthopedic Surgery Why:  for wound check on Monday  Contact information: [address] [place] [address] [phone]            Time coordinating discharge:  25-minutes  Signed:   Estill Cotta M.D. [facility] 10/27/2017, 11:29 AM Pager: [phone]

## 2017-10-27 NOTE — Anesthesia Postprocedure Evaluation (Signed)
Anesthesia Post Note  Patient: [name] Little  Procedure(s) Performed: IRRIGATION AND DEBRIDEMENT RIGHT HAND (Right Hand)     Patient location during evaluation: PACU Anesthesia Type: General Level of consciousness: awake and alert Pain management: pain level controlled Vital Signs Assessment: post-procedure vital signs reviewed and stable Respiratory status: spontaneous breathing, nonlabored ventilation, respiratory function stable and patient connected to nasal cannula oxygen Cardiovascular status: blood pressure returned to baseline and stable Postop Assessment: no apparent nausea or vomiting Anesthetic complications: no    Last Vitals:  Vitals:   10/26/17 2151 10/27/17 0515  BP: (!) 153/76 (!) 185/74  Pulse: 69   Resp: 15   Temp: 36.5 C (!) 36.3 C  SpO2: 96% 96%    Last Pain:  Vitals:   10/27/17 0515  TempSrc: Oral  PainSc:                  Karyl Kinnier [place]

## 2017-10-27 NOTE — Progress Notes (Signed)
ANTICOAGULATION CONSULT NOTE - Initial Consult  Pharmacy Consult for Warfarin  Indication: History of VTE, factor V deficiency  Allergies  Allergen Reactions  . Hydralazine Other (See Comments)    Joint and chest pain  . Amlodipine Swelling    "my whole body swelled"  . Carvedilol Swelling    "my whole body swelled"  . Cefuroxime Axetil     REACTION: Ddoes not remember reaction  . Chlorthalidone     Patient reported extreme weakness.  . Statins Other (See Comments)    Weakness, pain Lipitor and Pravachol Weakness, pain Lipitor and Pravachol  . Sulfonamide Derivatives     REACTION: Rash    Vital Signs: Temp: 97.7 Little (36.5 C) (09/27 2151) Temp Source: Oral (09/27 2151) BP: 153/76 (09/27 2151) Pulse Rate: 69 (09/27 2151)  Labs: Recent Labs    10/24/17 1634 10/26/17 0856 10/26/17 2008  HGB  --  13.8  --   HCT  --  43.2  --   PLT  --  357  --   LABPROT  --   --  19.9*  INR 1.9*  --  1.71  CREATININE  --  0.84  --     Estimated Creatinine Clearance: 52.1 mL/min (by C-G formula based on SCr of 0.84 mg/dL).   Medical History: Past Medical History:  Diagnosis Date  . Anxiety state, unspecified 09/22/2013  . Arthritis    lumbar stenosis, radiculopathy, OA- L hip  . BCC (basal cell carcinoma), scalp/neck 09/27/2015  . Blood clotting disorder (HCC)    V5 factor  . Blood dyscrasia    factor 5 deficiency  . Breast cancer ([facility]) 2010   RIGHT breast  . Clostridium difficile infection   . Clotting disorder ([facility])   . Diabetes mellitus type 2 in obese ([facility]) 02/26/2014  . DVT (deep venous thrombosis) ([facility])   . 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 Hemorrhoids   . Hyperglycemia 02/26/2014  . Hyperlipemia   . Hypertension   . Infiltrating ductal carcinoma of breast ([facility])    stage one right   . Low back pain 03/26/2014  . Macular degeneration   . Mitral valve regurgitation 09/01/2016  . Mixed hyperlipidemia 05/18/2015  . Nocturia 02/26/2014  . Paroxysmal supraventricular  tachycardia ([facility])   . Personal history of colonic polyps    hyperplastic  . Thoracic aortic aneurysm ([facility])   . TMJ pain dysfunction syndrome      Assessment: [name] Little on warfarin PTA for hx DVT/factor V deficiency presents to the ED with hand infection requiring surgical debridement. INR is sub-therapeutic at 1.71. CBC good.   Goal of Therapy:  INR 2-3 Monitor platelets by anticoagulation protocol: Yes   Plan:  -Warfarin 7.5 mg PO x 1 at 1800 today (boosted dose with low INR) -Daily PT/INR -Monitor for bleeding  [name] Little 10/27/2017,3:07 AM

## 2017-10-27 NOTE — Progress Notes (Signed)
Pt given prescriptions and discharge instructions. Answered all questions to satisfaction. All belongings gathered to be sent home. Pt in no distress at time of discharge. Son is driving her home.

## 2017-10-28 ENCOUNTER — Encounter (HOSPITAL_COMMUNITY): Payer: Self-pay | Admitting: Orthopedic Surgery

## 2017-10-30 ENCOUNTER — Telehealth: Payer: Self-pay

## 2017-10-30 NOTE — Telephone Encounter (Signed)
Called patient to schedule TCM follow up. Patient states she will be following up with her surgeon. Advised patient to call with anything we could do for her. Patient agreed.

## 2017-11-02 ENCOUNTER — Ambulatory Visit: Payer: Medicare Other

## 2017-11-05 ENCOUNTER — Ambulatory Visit: Payer: Medicare Other | Admitting: Family Medicine

## 2017-11-13 ENCOUNTER — Ambulatory Visit (INDEPENDENT_AMBULATORY_CARE_PROVIDER_SITE_OTHER): Payer: Medicare Other | Admitting: General Practice

## 2017-11-13 DIAGNOSIS — Z7901 Long term (current) use of anticoagulants: Secondary | ICD-10-CM

## 2017-11-13 DIAGNOSIS — Z86718 Personal history of other venous thrombosis and embolism: Secondary | ICD-10-CM

## 2017-11-13 LAB — POCT INR: INR: 3.9 — AB (ref 2.0–3.0)

## 2017-11-13 NOTE — Patient Instructions (Addendum)
.  Pre visit review using our clinic review tool, if applicable. No additional management support is needed unless otherwise documented below in the visit note.  Hold coumadin today (10/15) and take 1/2 tablet tomorrow (10/16) and then continue to take 1 tablet daily with 1/2 tablet on Mondays.  Re-check in 6 weeks.

## 2017-11-16 ENCOUNTER — Ambulatory Visit: Payer: Medicare Other

## 2017-11-22 ENCOUNTER — Encounter: Payer: Self-pay | Admitting: Family Medicine

## 2017-11-22 ENCOUNTER — Other Ambulatory Visit (HOSPITAL_COMMUNITY)
Admission: RE | Admit: 2017-11-22 | Discharge: 2017-11-22 | Disposition: A | Discharge status: Home / Self Care | Payer: Medicare Other | Source: Ambulatory Visit | Attending: Family Medicine | Admitting: Family Medicine

## 2017-11-22 ENCOUNTER — Ambulatory Visit: Payer: Medicare Other | Admitting: Family Medicine

## 2017-11-22 VITALS — BP 148/92 | HR 83 | Temp 97.6°F | Resp 18 | Wt 171.8 lb

## 2017-11-22 DIAGNOSIS — R300 Dysuria: Secondary | ICD-10-CM | POA: Diagnosis not present

## 2017-11-22 DIAGNOSIS — E669 Obesity, unspecified: Secondary | ICD-10-CM

## 2017-11-22 DIAGNOSIS — E782 Mixed hyperlipidemia: Secondary | ICD-10-CM | POA: Diagnosis not present

## 2017-11-22 DIAGNOSIS — Z23 Encounter for immunization: Secondary | ICD-10-CM | POA: Diagnosis not present

## 2017-11-22 DIAGNOSIS — I10 Essential (primary) hypertension: Secondary | ICD-10-CM

## 2017-11-22 DIAGNOSIS — E1169 Type 2 diabetes mellitus with other specified complication: Secondary | ICD-10-CM

## 2017-11-22 DIAGNOSIS — M545 Low back pain, unspecified: Secondary | ICD-10-CM

## 2017-11-22 DIAGNOSIS — L089 Local infection of the skin and subcutaneous tissue, unspecified: Secondary | ICD-10-CM

## 2017-11-22 DIAGNOSIS — C50919 Malignant neoplasm of unspecified site of unspecified female breast: Secondary | ICD-10-CM

## 2017-11-22 DIAGNOSIS — S61411D Laceration without foreign body of right hand, subsequent encounter: Secondary | ICD-10-CM

## 2017-11-22 LAB — URINALYSIS, ROUTINE W REFLEX MICROSCOPIC
Bilirubin Urine: NEGATIVE
Ketones, ur: NEGATIVE
Leukocytes, UA: NEGATIVE
Nitrite: NEGATIVE
Specific Gravity, Urine: 1.02 (ref 1.000–1.030)
Total Protein, Urine: NEGATIVE
Urine Glucose: NEGATIVE
Urobilinogen, UA: 0.2 (ref 0.0–1.0)
pH: 6 (ref 5.0–8.0)

## 2017-11-22 LAB — COMPREHENSIVE METABOLIC PANEL
ALT: 12 U/L (ref 0–35)
AST: 17 U/L (ref 0–37)
Albumin: 4.1 g/dL (ref 3.5–5.2)
Alkaline Phosphatase: 64 U/L (ref 39–117)
BUN: 20 mg/dL (ref 6–23)
CO2: 27 mEq/L (ref 19–32)
Calcium: 9.7 mg/dL (ref 8.4–10.5)
Chloride: 106 mEq/L (ref 96–112)
Creatinine, Ser: 0.79 mg/dL (ref 0.40–1.20)
GFR: 74.07 mL/min (ref >60.00)
Glucose, Bld: 98 mg/dL (ref 70–99)
Potassium: 4.7 mEq/L (ref 3.5–5.1)
Sodium: 141 mEq/L (ref 135–145)
Total Bilirubin: 0.6 mg/dL (ref 0.2–1.2)
Total Protein: 7.2 g/dL (ref 6.0–8.3)

## 2017-11-22 LAB — CBC WITH DIFFERENTIAL/PLATELET
Basophils Absolute: 0.1 10*3/uL (ref 0.0–0.1)
Basophils Relative: 1.3 % (ref 0.0–3.0)
Eosinophils Absolute: 0.2 10*3/uL (ref 0.0–0.7)
Eosinophils Relative: 2.7 % (ref 0.0–5.0)
HCT: 43.9 % (ref 36.0–46.0)
Hemoglobin: 14.6 g/dL (ref 12.0–15.0)
Lymphocytes Relative: 51.3 % — ABNORMAL HIGH (ref 12.0–46.0)
Lymphs Abs: 4.4 10*3/uL — ABNORMAL HIGH (ref 0.7–4.0)
MCHC: 33.2 g/dL (ref 30.0–36.0)
MCV: 90.8 fl (ref 78.0–100.0)
Monocytes Absolute: 1.1 10*3/uL — ABNORMAL HIGH (ref 0.1–1.0)
Monocytes Relative: 13 % — ABNORMAL HIGH (ref 3.0–12.0)
Neutro Abs: 2.7 10*3/uL (ref 1.4–7.7)
Neutrophils Relative %: 31.7 % — ABNORMAL LOW (ref 43.0–77.0)
Platelets: 340 10*3/uL (ref 150.0–400.0)
RBC: 4.84 Mil/uL (ref 3.87–5.11)
RDW: 14.9 % (ref 11.5–15.5)
WBC: 8.5 10*3/uL (ref 4.0–10.5)

## 2017-11-22 MED ORDER — LABETALOL HCL 100 MG PO TABS: 50.0000 mg | ORAL_TABLET | Freq: Two times a day (BID) | ORAL | 3 refills | Status: DC

## 2017-11-22 NOTE — Progress Notes (Signed)
Subjective:    Patient ID: [name] Little, female    DOB: 11-17-1933, 84 y.o.   MRN: [id number]  No chief complaint on file.   HPI Patient is in today for follow-up.  Last month she injured her right hand on some farm equipment and presented to the ER for 14 stitches and tetanus shot.  Following that incident she developed a secondary infection in her hand and required surgical debridement underscore anesthesia with orthopedics, Dr. appointment of the [facility].  Since that procedure she has been following closely with orthopedics and is greatly improved.  Her hand pain is nearly resolved she has finished a course of Augmentin and feels greatly improved.  She endorses persistent fatigue but denies any fevers or chills.  She was struggling with some vaginal irritation but with some Monistat that did improved she has noted though some persistent dysuria.  No hematuria.  Notes some ongoing low back pain but this is not significantly worse from her baseline. Denies CP/palp/SOB/HA/congestion/fevers/GI c/o. Taking meds as prescribed  Past Medical History:  Diagnosis Date  . Anxiety state, unspecified 09/22/2013  . Arthritis    lumbar stenosis, radiculopathy, OA- L hip  . BCC (basal cell carcinoma), scalp/neck 09/27/2015  . Blood clotting disorder (HCC)    V5 factor  . Blood dyscrasia    factor 5 deficiency  . Breast cancer ([place]) 2010   RIGHT breast  . Clostridium difficile infection   . Clotting disorder ([place])   . Diabetes mellitus type 2 in obese ([place]) 02/26/2014  . DVT (deep venous thrombosis) ([place])   . Factor V deficiency ([place])   . Hemorrhoids   . Hyperglycemia 02/26/2014  . Hyperlipemia   . Hypertension   . Infiltrating ductal carcinoma of breast ([place])    stage one right   . Low back pain 03/26/2014  . Macular degeneration   . Mitral valve regurgitation 09/01/2016  . Mixed hyperlipidemia 05/18/2015  . Nocturia 02/26/2014  . Paroxysmal supraventricular tachycardia ([place])   . Personal  history of colonic polyps    hyperplastic  . Thoracic aortic aneurysm ([place])   . TMJ pain dysfunction syndrome     Past Surgical History:  Procedure Laterality Date  . ABDOMINAL HYSTERECTOMY     1960- ? vaginal hysterectomy, not laparoscopic   . BREAST LUMPECTOMY  2010   right Dr. [name]  . COLONOSCOPY    . EYE SURGERY     bilateral cataracts- /w IOL   . I&D EXTREMITY Right 10/26/2017   Procedure: IRRIGATION AND DEBRIDEMENT RIGHT HAND;  Surgeon: Iran Planas, MD;  Location: [facility];  Service: Orthopedics;  Laterality: Right;  . JOINT REPLACEMENT     R hip replacement   . LAPAROSCOPIC HYSTERECTOMY    . LUMBAR LAMINECTOMY/DECOMPRESSION MICRODISCECTOMY  04/11/2011   Procedure: LUMBAR LAMINECTOMY/DECOMPRESSION MICRODISCECTOMY;  Surgeon: [name], MD;  Location: [facility];  Service: Neurosurgery;  Laterality: N/A;  Lumbar Five-Sacral One Microdiscectomy  . right calf surgery     for cancer  . SPLENECTOMY    . TOTAL HIP ARTHROPLASTY  1999   right  . UPPER GASTROINTESTINAL ENDOSCOPY      Family History  Problem Relation Age of Onset  . Heart disease Mother   . Heart disease Father   . Alzheimer's disease Sister   . Heart disease Unknown        uncles  . Clotting disorder Maternal Uncle   . Breast cancer Paternal Aunt   . Cancer Paternal Aunt  breast  . Colon cancer Neg Hx   . Anesthesia problems Neg Hx   . Hypotension Neg Hx   . Malignant hyperthermia Neg Hx   . Pseudochol deficiency Neg Hx     Social History   Socioeconomic History  . Marital status: Married    Spouse name: Not on file  . Number of children: 4  . Years of education: Not on file  . Highest education level: Not on file  Occupational History  . Occupation: Retired  Scientific laboratory technician  . Financial resource strain: Not on file  . Food insecurity:    Worry: Not on file    Inability: Not on file  . Transportation needs:    Medical: Not on file    Non-medical: Not on file  Tobacco Use  .  Smoking status: Never Smoker  . Smokeless tobacco: Never Used  Substance and Sexual Activity  . Alcohol use: No    Alcohol/week: 0.0 standard drinks  . Drug use: No  . Sexual activity: Never    Partners: Male  Lifestyle  . Physical activity:    Days per week: Not on file    Minutes per session: Not on file  . Stress: Not on file  Relationships  . Social connections:    Talks on phone: Not on file    Gets together: Not on file    Attends religious service: Not on file    Active member of club or organization: Not on file    Attends meetings of clubs or organizations: Not on file    Relationship status: Not on file  . Intimate partner violence:    Fear of current or ex partner: Not on file    Emotionally abused: Not on file    Physically abused: Not on file    Forced sexual activity: Not on file  Other Topics Concern  . Not on file  Social History Narrative   Married 1955   2 sons - '59, '61, 2 daughters '55, '57; 8 grandchildren; 1 great grand   I-ADLs       Outpatient Medications Prior to Visit  Medication Sig Dispense Refill  . ALPRAZolam (XANAX) 0.25 MG tablet TAKE ONE TABLET BY MOUTH TWICE DIALY AS NEEDED FOR ANXIETY (Patient taking differently: Take 0.25 mg by mouth daily as needed for anxiety. ) 20 tablet 1  . amoxicillin-clavulanate (AUGMENTIN) 875-125 MG tablet Take 1 tablet by mouth 2 (two) times daily. 28 tablet 0  . B Complex Vitamins (VITAMIN B COMPLEX PO) Take 1 tablet by mouth daily.     . bacitracin ointment Apply 1 application topically 2 (two) times daily. 14 g 0  . Blood Glucose Monitoring Suppl (ONE TOUCH ULTRA 2) w/Device KIT See admin instructions.  0  . doxycycline (VIBRA-TABS) 100 MG tablet Take 1 tablet (100 mg total) by mouth 2 (two) times daily. 28 tablet 0  . furosemide (LASIX) 20 MG tablet Take 1 tablet (20 mg total) by mouth daily as needed for fluid or edema. 30 tablet 1  . glucose blood (IGLUCOSE TEST STRIPS) test strip Test Blood Sugars every  day as needed 100 each 5  . glucose monitoring kit (FREESTYLE) monitoring kit 1 each by Does not apply route as needed for other. 1 each 0  . Lancets MISC Test Blood Sugars every day as needed 100 each 5  . losartan (COZAAR) 100 MG tablet Take 1 tablet (100 mg total) by mouth daily. 90 tablet 1  . Multiple Vitamins-Minerals (MULTIVITAMIN  ADULTS) TABS Take 1 tablet by mouth daily.    . ondansetron (ZOFRAN ODT) 4 MG disintegrating tablet Take 1 tablet (4 mg total) by mouth every 8 (eight) hours as needed for nausea or vomiting. 20 tablet 0  . potassium chloride SA (K-DUR,KLOR-CON) 20 MEQ tablet Take 1 tablet (20 mEq total) by mouth daily as needed (lasix use). 30 tablet 0  . warfarin (COUMADIN) 5 MG tablet Take 1 tablet (5 mg) by mouth daily except on Mon Take 0.5 tablet (2.5 mg) (Patient taking differently: Take 2.5-5 mg by mouth See admin instructions. Take 1 tablet (5 mg) by mouth daily except on Sun Take 0.5 tablet (2.5 mg)) 90 tablet 3   No facility-administered medications prior to visit.     Allergies  Allergen Reactions  . Hydralazine Other (See Comments)    Joint and chest pain  . Amlodipine Swelling    "my whole body swelled"  . Carvedilol Swelling    "my whole body swelled"  . Cefuroxime Axetil     REACTION: Ddoes not remember reaction  . Chlorthalidone     Patient reported extreme weakness.  . Statins Other (See Comments)    Weakness, pain Lipitor and Pravachol Weakness, pain Lipitor and Pravachol  . Sulfonamide Derivatives     REACTION: Rash    Review of Systems  Constitutional: Positive for malaise/fatigue. Negative for fever.  HENT: Negative for congestion.   Eyes: Negative for blurred vision.  Respiratory: Negative for shortness of breath.   Cardiovascular: Negative for chest pain, palpitations and leg swelling.  Gastrointestinal: Negative for abdominal pain, blood in stool and nausea.  Genitourinary: Negative for dysuria and frequency.  Musculoskeletal:  Positive for back pain and joint pain. Negative for falls.  Skin: Negative for rash.  Neurological: Negative for dizziness, loss of consciousness and headaches.  Endo/Heme/Allergies: Negative for environmental allergies.  Psychiatric/Behavioral: Negative for depression. The patient is not nervous/anxious.        Objective:    Physical Exam  Constitutional: She is oriented to person, place, and time. She appears well-developed and well-nourished. No distress.  HENT:  Head: Normocephalic and atraumatic.  Nose: Nose normal.  Eyes: Right eye exhibits no discharge. Left eye exhibits no discharge.  Neck: Normal range of motion. Neck supple.  Cardiovascular: Normal rate.  No murmur heard. Pulmonary/Chest: Effort normal and breath sounds normal.  Abdominal: Soft. Bowel sounds are normal. There is no tenderness.  Musculoskeletal: She exhibits deformity. She exhibits no edema.  Right hand bandaged no swelling or redness in fingers or up her arm  Neurological: She is alert and oriented to person, place, and time.  Skin: Skin is warm and dry.  Psychiatric: She has a normal mood and affect.  Nursing note and vitals reviewed.   BP (!) 148/92 (BP Location: Left Arm, Patient Position: Sitting, Cuff Size: Normal)   Pulse 83   Temp 97.6 F (36.4 C) (Oral)   Resp 18   Wt 171 lb 12.8 oz (77.9 kg)   SpO2 97%   BMI 30.43 kg/m  Wt Readings from Last 3 Encounters:  11/22/17 171 lb 12.8 oz (77.9 kg)  10/24/17 173 lb 2 oz (78.5 kg)  10/21/17 173 lb (78.5 kg)     Lab Results  Component Value Date   WBC 7.2 10/27/2017   HGB 13.9 10/27/2017   HCT 43.8 10/27/2017   PLT 358 10/27/2017   GLUCOSE 181 (H) 10/27/2017   CHOL 219 (H) 09/17/2017   TRIG 315.0 (H) 09/17/2017  HDL 36.40 (L) 09/17/2017   LDLDIRECT 121.0 09/17/2017   LDLCALC 134 (H) 03/05/2017   ALT 14 10/26/2017   AST 20 10/26/2017   NA 139 10/27/2017   K 4.2 10/27/2017   CL 105 10/27/2017   CREATININE 0.81 10/27/2017   BUN 15  10/27/2017   CO2 23 10/27/2017   TSH 3.676 10/26/2017   INR 3.9 (A) 11/13/2017   HGBA1C 6.6 (H) 10/27/2017   MICROALBUR 1.5 10/20/2014    Lab Results  Component Value Date   TSH 3.676 10/26/2017   Lab Results  Component Value Date   WBC 7.2 10/27/2017   HGB 13.9 10/27/2017   HCT 43.8 10/27/2017   MCV 94.2 10/27/2017   PLT 358 10/27/2017   Lab Results  Component Value Date   NA 139 10/27/2017   K 4.2 10/27/2017   CHLORIDE 109 08/12/2013   CO2 23 10/27/2017   GLUCOSE 181 (H) 10/27/2017   BUN 15 10/27/2017   CREATININE 0.81 10/27/2017   BILITOT 0.7 10/26/2017   ALKPHOS 62 10/26/2017   AST 20 10/26/2017   ALT 14 10/26/2017   PROT 6.9 10/26/2017   ALBUMIN 3.5 10/26/2017   CALCIUM 9.2 10/27/2017   ANIONGAP 11 10/27/2017   GFR 63.75 09/17/2017   Lab Results  Component Value Date   CHOL 219 (H) 09/17/2017   Lab Results  Component Value Date   HDL 36.40 (L) 09/17/2017   Lab Results  Component Value Date   LDLCALC 134 (H) 03/05/2017   Lab Results  Component Value Date   TRIG 315.0 (H) 09/17/2017   Lab Results  Component Value Date   CHOLHDL 6 09/17/2017   Lab Results  Component Value Date   HGBA1C 6.6 (H) 10/27/2017       Assessment & Plan:   Problem List Items Addressed This Visit    Malignant neoplasm of female breast [facility])    MGM July 2019 no new concerns.       Hyperlipidemia    Encouraged heart healthy diet, increase exercise, avoid trans fats, consider a krill oil cap daily      Relevant Medications   labetalol (NORMODYNE) 100 MG tablet   Essential hypertension - Primary   Relevant Medications   labetalol (NORMODYNE) 100 MG tablet   Other Relevant Orders   CBC with Differential/Platelet   Comprehensive metabolic panel   Diabetes mellitus type 2 in obese (HCC)    hgba1c acceptable, minimize simple carbs. Increase exercise as tolerated. Continue current meds      Low back pain    Encouraged moist heat and gentle stretching as  tolerated. May try NSAIDs and prescription meds as directed and report if symptoms worsen or seek immediate care      Laceration of right hand with infection    Injured her hand on farm equipment and then required 14 stitches and Tdap. Unfortunately developed a secondary infection requiring surgical debridement and antibiotics. After a course of Augmentin her pain is improving. Dr Apolonio Schneiders and his team at [facility] are following her recovery closely and she is greatly improved.        Other Visit Diagnoses    Dysuria       Relevant Orders   Urine Culture   Urinalysis   Urine cytology ancillary only      I am having Kathrine Cords. Forand start on labetalol. I am also having her maintain her B Complex Vitamins (VITAMIN B COMPLEX PO), potassium chloride SA, glucose monitoring kit, Lancets, glucose blood,  MULTIVITAMIN ADULTS, ONE TOUCH ULTRA 2, furosemide, warfarin, ALPRAZolam, losartan, bacitracin, doxycycline, amoxicillin-clavulanate, and ondansetron.  Meds ordered this encounter  Medications  . labetalol (NORMODYNE) 100 MG tablet    Sig: Take 0.5 tablets (50 mg total) by mouth 2 (two) times daily.    Dispense:  30 tablet    Refill:  3     Penni Homans, MD

## 2017-11-22 NOTE — Assessment & Plan Note (Signed)
Injured her hand on farm equipment and then required 14 stitches and Tdap. Unfortunately developed a secondary infection requiring surgical debridement and antibiotics. After a course of Augmentin her pain is improving. Dr Apolonio Schneiders and his team at [facility] are following her recovery closely and she is greatly improved.

## 2017-11-22 NOTE — Assessment & Plan Note (Signed)
Encouraged moist heat and gentle stretching as tolerated. May try NSAIDs and prescription meds as directed and report if symptoms worsen or seek immediate care 

## 2017-11-22 NOTE — Assessment & Plan Note (Signed)
Encouraged heart healthy diet, increase exercise, avoid trans fats, consider a krill oil cap daily 

## 2017-11-22 NOTE — Patient Instructions (Signed)

## 2017-11-22 NOTE — Assessment & Plan Note (Signed)
MGM July 2019 no new concerns.

## 2017-11-22 NOTE — Assessment & Plan Note (Signed)
hgba1c acceptable, minimize simple carbs. Increase exercise as tolerated. Continue current meds 

## 2017-11-24 LAB — URINE CULTURE
MICRO NUMBER:: 91280921
SPECIMEN QUALITY:: ADEQUATE

## 2017-11-26 ENCOUNTER — Ambulatory Visit: Payer: Medicare Other | Admitting: Family Medicine

## 2017-11-26 LAB — URINE CYTOLOGY ANCILLARY ONLY
Bacterial vaginitis: NEGATIVE
Candida vaginitis: NEGATIVE

## 2017-11-26 MED ORDER — CIPROFLOXACIN HCL 250 MG PO TABS: 250.0000 mg | ORAL_TABLET | Freq: Two times a day (BID) | ORAL | 0 refills | Status: DC

## 2017-11-26 NOTE — Addendum Note (Signed)
Addended by: Magdalene Molly A on: 11/26/2017 01:33 PM   Modules accepted: Orders

## 2017-12-18 ENCOUNTER — Ambulatory Visit (INDEPENDENT_AMBULATORY_CARE_PROVIDER_SITE_OTHER): Payer: Medicare Other | Admitting: General Practice

## 2017-12-18 DIAGNOSIS — Z86718 Personal history of other venous thrombosis and embolism: Secondary | ICD-10-CM

## 2017-12-18 DIAGNOSIS — Z7901 Long term (current) use of anticoagulants: Secondary | ICD-10-CM | POA: Diagnosis not present

## 2017-12-18 LAB — POCT INR: INR: 2.7 (ref 2.0–3.0)

## 2017-12-18 NOTE — Patient Instructions (Addendum)
Pre visit review using our clinic review tool, if applicable. No additional management support is needed unless otherwise documented below in the visit note.  Continue to take 1 tablet daily with 1/2 tablet on Mondays.  Re-check in 6 weeks.

## 2017-12-24 ENCOUNTER — Encounter: Payer: Self-pay | Admitting: Family Medicine

## 2017-12-24 ENCOUNTER — Ambulatory Visit: Payer: Medicare Other | Admitting: Family Medicine

## 2017-12-24 DIAGNOSIS — S6990XD Unspecified injury of unspecified wrist, hand and finger(s), subsequent encounter: Secondary | ICD-10-CM

## 2017-12-24 DIAGNOSIS — E669 Obesity, unspecified: Secondary | ICD-10-CM

## 2017-12-24 DIAGNOSIS — I10 Essential (primary) hypertension: Secondary | ICD-10-CM | POA: Diagnosis not present

## 2017-12-24 DIAGNOSIS — E1169 Type 2 diabetes mellitus with other specified complication: Secondary | ICD-10-CM

## 2017-12-24 DIAGNOSIS — J40 Bronchitis, not specified as acute or chronic: Secondary | ICD-10-CM | POA: Diagnosis not present

## 2017-12-24 MED ORDER — METOPROLOL TARTRATE 25 MG PO TABS: 12.5000 mg | ORAL_TABLET | Freq: Two times a day (BID) | ORAL | 3 refills | Status: DC

## 2017-12-24 MED ORDER — AMOXICILLIN 500 MG PO CAPS: 500.0000 mg | ORAL_CAPSULE | Freq: Three times a day (TID) | ORAL | 0 refills | Status: DC

## 2017-12-24 MED ORDER — LOSARTAN POTASSIUM 100 MG PO TABS: 100.0000 mg | ORAL_TABLET | Freq: Every day | ORAL | 0 refills | Status: DC

## 2017-12-24 NOTE — Patient Instructions (Addendum)
Need nurse visit for blood pressure check in roughly 3-4 weeks  Mucinex plain twice daily x 10 days, Elderberry liquid or tabs, Zinc such Coldeeze daily if no improvement or worsens in next 2 weeks then can consider antibiotic   Hypertension Hypertension, commonly called high blood pressure, is when the force of blood pumping through the arteries is too strong. The arteries are the blood vessels that carry blood from the heart throughout the body. Hypertension forces the heart to work harder to pump blood and may cause arteries to become narrow or stiff. Having untreated or uncontrolled hypertension can cause heart attacks, strokes, kidney disease, and other problems. A blood pressure reading consists of a higher number over a lower number. Ideally, your blood pressure should be below 120/80. The first ("top") number is called the systolic pressure. It is a measure of the pressure in your arteries as your heart beats. The second ("bottom") number is called the diastolic pressure. It is a measure of the pressure in your arteries as the heart relaxes. What are the causes? The cause of this condition is not known. What increases the risk? Some risk factors for high blood pressure are under your control. Others are not. Factors you can change  Smoking.  Having type 2 diabetes mellitus, high cholesterol, or both.  Not getting enough exercise or physical activity.  Being overweight.  Having too much fat, sugar, calories, or salt (sodium) in your diet.  Drinking too much alcohol. Factors that are difficult or impossible to change  Having chronic kidney disease.  Having a family history of high blood pressure.  Age. Risk increases with age.  Race. You may be at higher risk if you are African-American.  Gender. Men are at higher risk than women before age 103. After age 27, women are at higher risk than men.  Having obstructive sleep apnea.  Stress. What are the signs or  symptoms? Extremely high blood pressure (hypertensive crisis) may cause:  Headache.  Anxiety.  Shortness of breath.  Nosebleed.  Nausea and vomiting.  Severe chest pain.  Jerky movements you cannot control (seizures).  How is this diagnosed? This condition is diagnosed by measuring your blood pressure while you are seated, with your arm resting on a surface. The cuff of the blood pressure monitor will be placed directly against the skin of your upper arm at the level of your heart. It should be measured at least twice using the same arm. Certain conditions can cause a difference in blood pressure between your right and left arms. Certain factors can cause blood pressure readings to be lower or higher than normal (elevated) for a short period of time:  When your blood pressure is higher when you are in a health care provider's office than when you are at home, this is called white coat hypertension. Most people with this condition do not need medicines.  When your blood pressure is higher at home than when you are in a health care provider's office, this is called masked hypertension. Most people with this condition may need medicines to control blood pressure.  If you have a high blood pressure reading during one visit or you have normal blood pressure with other risk factors:  You may be asked to return on a different day to have your blood pressure checked again.  You may be asked to monitor your blood pressure at home for 1 week or longer.  If you are diagnosed with hypertension, you may have other blood  or imaging tests to help your health care provider understand your overall risk for other conditions. How is this treated? This condition is treated by making healthy lifestyle changes, such as eating healthy foods, exercising more, and reducing your alcohol intake. Your health care provider may prescribe medicine if lifestyle changes are not enough to get your blood pressure  under control, and if:  Your systolic blood pressure is above 130.  Your diastolic blood pressure is above 80.  Your personal target blood pressure may vary depending on your medical conditions, your age, and other factors. Follow these instructions at home: Eating and drinking  Eat a diet that is high in fiber and potassium, and low in sodium, added sugar, and fat. An example eating plan is called the DASH (Dietary Approaches to Stop Hypertension) diet. To eat this way: ? Eat plenty of fresh fruits and vegetables. Try to fill half of your plate at each meal with fruits and vegetables. ? Eat whole grains, such as whole wheat pasta, brown rice, or whole grain bread. Fill about one quarter of your plate with whole grains. ? Eat or drink low-fat dairy products, such as skim milk or low-fat yogurt. ? Avoid fatty cuts of meat, processed or cured meats, and poultry with skin. Fill about one quarter of your plate with lean proteins, such as fish, chicken without skin, beans, eggs, and tofu. ? Avoid premade and processed foods. These tend to be higher in sodium, added sugar, and fat.  Reduce your daily sodium intake. Most people with hypertension should eat less than 1,500 mg of sodium a day.  Limit alcohol intake to no more than 1 drink a day for nonpregnant women and 2 drinks a day for men. One drink equals 12 oz of beer, 5 oz of wine, or 1 oz of hard liquor. Lifestyle  Work with your health care provider to maintain a healthy body weight or to lose weight. Ask what an ideal weight is for you.  Get at least 30 minutes of exercise that causes your heart to beat faster (aerobic exercise) most days of the week. Activities may include walking, swimming, or biking.  Include exercise to strengthen your muscles (resistance exercise), such as pilates or lifting weights, as part of your weekly exercise routine. Try to do these types of exercises for 30 minutes at least 3 days a week.  Do not use any  products that contain nicotine or tobacco, such as cigarettes and e-cigarettes. If you need help quitting, ask your health care provider.  Monitor your blood pressure at home as told by your health care provider.  Keep all follow-up visits as told by your health care provider. This is important. Medicines  Take over-the-counter and prescription medicines only as told by your health care provider. Follow directions carefully. Blood pressure medicines must be taken as prescribed.  Do not skip doses of blood pressure medicine. Doing this puts you at risk for problems and can make the medicine less effective.  Ask your health care provider about side effects or reactions to medicines that you should watch for. Contact a health care provider if:  You think you are having a reaction to a medicine you are taking.  You have headaches that keep coming back (recurring).  You feel dizzy.  You have swelling in your ankles.  You have trouble with your vision. Get help right away if:  You develop a severe headache or confusion.  You have unusual weakness or numbness.  You  feel faint.  You have severe pain in your chest or abdomen.  You vomit repeatedly.  You have trouble breathing. Summary  Hypertension is when the force of blood pumping through your arteries is too strong. If this condition is not controlled, it may put you at risk for serious complications.  Your personal target blood pressure may vary depending on your medical conditions, your age, and other factors. For most people, a normal blood pressure is less than 120/80.  Hypertension is treated with lifestyle changes, medicines, or a combination of both. Lifestyle changes include weight loss, eating a healthy, low-sodium diet, exercising more, and limiting alcohol. This information is not intended to replace advice given to you by your health care provider. Make sure you discuss any questions you have with your health care  provider. Document Released: 01/16/2005 Document Revised: 12/15/2015 Document Reviewed: 12/15/2015 Elsevier Interactive Patient Education  [employer].

## 2017-12-25 ENCOUNTER — Ambulatory Visit: Payer: Medicare Other

## 2017-12-31 DIAGNOSIS — J40 Bronchitis, not specified as acute or chronic: Secondary | ICD-10-CM | POA: Insufficient documentation

## 2017-12-31 NOTE — Assessment & Plan Note (Signed)
Has recovered well from right hand surgery.

## 2017-12-31 NOTE — Assessment & Plan Note (Signed)
hgba1c acceptable, minimize simple carbs. Increase exercise as tolerated.  

## 2017-12-31 NOTE — Assessment & Plan Note (Signed)
Did not tolerate last change will try Metoprolol tartrate 25 mg 1/2 tab bid recheckc bp in 3 weeks

## 2017-12-31 NOTE — Assessment & Plan Note (Signed)
Encouraged increased rest and hydration, add probiotics, zinc such as Coldeze or Xicam. Treat fevers as needed, mucinex, vitamin c and elderberry. If no response is given an rx for Amoxicillin

## 2017-12-31 NOTE — Progress Notes (Addendum)
Subjective:    Patient ID: [name] Little, female    DOB: [birth year], 84 y.o.   MRN: [id number]  Chief Complaint  Patient presents with  . Follow-up    HPI Patient is in today for follow-up.  She did not tolerate her last blood pressure medication so she stopped it.  She otherwise reports congestion.  She has been sick about a week.  She has yellow rhinorrhea cough malaise fatigue and chest congestion.  She has used Coricidin HBP with only minimal relief.  No other acute concerns are noted. Denies CP/palp/SOB/fevers/GI or GU c/o. Taking meds as prescribed  Past Medical History:  Diagnosis Date  . Anxiety state, unspecified 09/22/2013  . Arthritis    lumbar stenosis, radiculopathy, OA- L hip  . BCC (basal cell carcinoma), scalp/neck 09/27/2015  . Blood clotting disorder (HCC)    V5 factor  . Blood dyscrasia    factor 5 deficiency  . Breast cancer ([place]) 2010   RIGHT breast  . Clostridium difficile infection   . Clotting disorder ([place])   . Diabetes mellitus type 2 in obese ([place]) 02/26/2014  . DVT (deep venous thrombosis) ([place])   . Factor V deficiency ([place])   . Hemorrhoids   . Hyperglycemia 02/26/2014  . Hyperlipemia   . Hypertension   . Infiltrating ductal carcinoma of breast ([place])    stage one right   . Low back pain 03/26/2014  . Macular degeneration   . Mitral valve regurgitation 09/01/2016  . Mixed hyperlipidemia 05/18/2015  . Nocturia 02/26/2014  . Paroxysmal supraventricular tachycardia ([place])   . Personal history of colonic polyps    hyperplastic  . Thoracic aortic aneurysm ([place])   . TMJ pain dysfunction syndrome     Past Surgical History:  Procedure Laterality Date  . ABDOMINAL HYSTERECTOMY     1960- ? vaginal hysterectomy, not laparoscopic   . BREAST LUMPECTOMY  2010   right Dr. [name]  . COLONOSCOPY    . EYE SURGERY     bilateral cataracts- /w IOL   . I&D EXTREMITY Right 10/26/2017   Procedure: IRRIGATION AND DEBRIDEMENT RIGHT HAND;  Surgeon: Iran Planas, MD;   Location: [facility];  Service: Orthopedics;  Laterality: Right;  . JOINT REPLACEMENT     R hip replacement   . LAPAROSCOPIC HYSTERECTOMY    . LUMBAR LAMINECTOMY/DECOMPRESSION MICRODISCECTOMY  04/11/2011   Procedure: LUMBAR LAMINECTOMY/DECOMPRESSION MICRODISCECTOMY;  Surgeon: [name], MD;  Location: [facility];  Service: Neurosurgery;  Laterality: N/A;  Lumbar Five-Sacral One Microdiscectomy  . right calf surgery     for cancer  . SPLENECTOMY    . TOTAL HIP ARTHROPLASTY  1999   right  . UPPER GASTROINTESTINAL ENDOSCOPY      Family History  Problem Relation Age of Onset  . Heart disease Mother   . Heart disease Father   . Alzheimer's disease Sister   . Heart disease Unknown        uncles  . Clotting disorder Maternal Uncle   . Breast cancer Paternal Aunt   . Cancer Paternal Aunt        breast  . Colon cancer Neg Hx   . Anesthesia problems Neg Hx   . Hypotension Neg Hx   . Malignant hyperthermia Neg Hx   . Pseudochol deficiency Neg Hx     Social History   Socioeconomic History  . Marital status: Married    Spouse name: Not on file  . Number of children: 4  . Years of  education: Not on file  . Highest education level: Not on file  Occupational History  . Occupation: Retired  Scientific laboratory technician  . Financial resource strain: Not on file  . Food insecurity:    Worry: Not on file    Inability: Not on file  . Transportation needs:    Medical: Not on file    Non-medical: Not on file  Tobacco Use  . Smoking status: Never Smoker  . Smokeless tobacco: Never Used  Substance and Sexual Activity  . Alcohol use: No    Alcohol/week: 0.0 standard drinks  . Drug use: No  . Sexual activity: Never    Partners: Male  Lifestyle  . Physical activity:    Days per week: Not on file    Minutes per session: Not on file  . Stress: Not on file  Relationships  . Social connections:    Talks on phone: Not on file    Gets together: Not on file    Attends religious service: Not on file      Active member of club or organization: Not on file    Attends meetings of clubs or organizations: Not on file    Relationship status: Not on file  . Intimate partner violence:    Fear of current or ex partner: Not on file    Emotionally abused: Not on file    Physically abused: Not on file    Forced sexual activity: Not on file  Other Topics Concern  . Not on file  Social History Narrative   Married 1955   2 sons - '59, '61, 2 daughters '55, '57; 8 grandchildren; 1 great grand   I-ADLs       Outpatient Medications Prior to Visit  Medication Sig Dispense Refill  . ALPRAZolam (XANAX) 0.25 MG tablet TAKE ONE TABLET BY MOUTH TWICE DIALY AS NEEDED FOR ANXIETY (Patient taking differently: Take 0.25 mg by mouth daily as needed for anxiety. ) 20 tablet 1  . B Complex Vitamins (VITAMIN B COMPLEX PO) Take 1 tablet by mouth daily.     . Blood Glucose Monitoring Suppl (ONE TOUCH ULTRA 2) w/Device KIT See admin instructions.  0  . furosemide (LASIX) 20 MG tablet Take 1 tablet (20 mg total) by mouth daily as needed for fluid or edema. 30 tablet 1  . glucose blood (IGLUCOSE TEST STRIPS) test strip Test Blood Sugars every day as needed 100 each 5  . glucose monitoring kit (FREESTYLE) monitoring kit 1 each by Does not apply route as needed for other. 1 each 0  . Lancets MISC Test Blood Sugars every day as needed 100 each 5  . Multiple Vitamins-Minerals (MULTIVITAMIN ADULTS) TABS Take 1 tablet by mouth daily.    . ondansetron (ZOFRAN ODT) 4 MG disintegrating tablet Take 1 tablet (4 mg total) by mouth every 8 (eight) hours as needed for nausea or vomiting. 20 tablet 0  . potassium chloride SA (K-DUR,KLOR-CON) 20 MEQ tablet Take 1 tablet (20 mEq total) by mouth daily as needed (lasix use). 30 tablet 0  . warfarin (COUMADIN) 5 MG tablet Take 1 tablet (5 mg) by mouth daily except on Mon Take 0.5 tablet (2.5 mg) (Patient taking differently: Take 2.5-5 mg by mouth See admin instructions. Take 1 tablet (5  mg) by mouth daily except on Sun Take 0.5 tablet (2.5 mg)) 90 tablet 3  . bacitracin ointment Apply 1 application topically 2 (two) times daily. 14 g 0  . labetalol (NORMODYNE) 100 MG tablet Take  0.5 tablets (50 mg total) by mouth 2 (two) times daily. 30 tablet 3  . losartan (COZAAR) 100 MG tablet Take 1 tablet (100 mg total) by mouth daily. 90 tablet 1  . amoxicillin-clavulanate (AUGMENTIN) 875-125 MG tablet Take 1 tablet by mouth 2 (two) times daily. (Patient not taking: Reported on 12/24/2017) 28 tablet 0  . ciprofloxacin (CIPRO) 250 MG tablet Take 1 tablet (250 mg total) by mouth 2 (two) times daily. (Patient not taking: Reported on 12/24/2017) 10 tablet 0  . doxycycline (VIBRA-TABS) 100 MG tablet Take 1 tablet (100 mg total) by mouth 2 (two) times daily. (Patient not taking: Reported on 12/24/2017) 28 tablet 0   No facility-administered medications prior to visit.     Allergies  Allergen Reactions  . Hydralazine Other (See Comments)    Joint and chest pain  . Amlodipine Swelling    "my whole body swelled"  . Carvedilol Swelling    "my whole body swelled"  . Cefuroxime Axetil     REACTION: Ddoes not remember reaction  . Chlorthalidone     Patient reported extreme weakness.  . Statins Other (See Comments)    Weakness, pain Lipitor and Pravachol Weakness, pain Lipitor and Pravachol  . Sulfonamide Derivatives     REACTION: Rash    Review of Systems  Constitutional: Positive for malaise/fatigue. Negative for fever.  HENT: Positive for congestion.   Eyes: Negative for blurred vision.  Respiratory: Positive for cough and sputum production. Negative for shortness of breath.   Cardiovascular: Negative for chest pain, palpitations and leg swelling.  Gastrointestinal: Negative for abdominal pain, blood in stool and nausea.  Genitourinary: Negative for dysuria and frequency.  Musculoskeletal: Negative for falls.  Skin: Negative for rash.  Neurological: Positive for headaches.  Negative for dizziness and loss of consciousness.  Endo/Heme/Allergies: Negative for environmental allergies.  Psychiatric/Behavioral: Negative for depression. The patient is not nervous/anxious.        Objective:    Physical Exam  Constitutional: She is oriented to person, place, and time. She appears well-developed and well-nourished. No distress.  HENT:  Head: Normocephalic and atraumatic.  Nose: Nose normal.  Eyes: Right eye exhibits no discharge. Left eye exhibits no discharge.  Neck: Normal range of motion. Neck supple.  Cardiovascular: Normal rate and regular rhythm.  No murmur heard. Pulmonary/Chest: Effort normal and breath sounds normal.  Abdominal: Soft. Bowel sounds are normal. There is no tenderness.  Musculoskeletal: She exhibits no edema.  Neurological: She is alert and oriented to person, place, and time.  Skin: Skin is warm and dry.  Psychiatric: She has a normal mood and affect.  Nursing note and vitals reviewed.   BP (!) 142/84 (BP Location: Left Arm, Patient Position: Sitting, Cuff Size: Normal)   Pulse 74   Temp 97.9 F (36.6 C) (Oral)   Resp 18   Ht _0  (1.6 m)   Wt 171 lb (77.6 kg)   SpO2 95%   BMI 30.29 kg/m  Wt Readings from Last 3 Encounters:  12/24/17 171 lb (77.6 kg)  11/22/17 171 lb 12.8 oz (77.9 kg)  10/24/17 173 lb 2 oz (78.5 kg)     Lab Results  Component Value Date   WBC 8.5 11/22/2017   HGB 14.6 11/22/2017   HCT 43.9 11/22/2017   PLT 340.0 11/22/2017   GLUCOSE 98 11/22/2017   CHOL 219 (H) 09/17/2017   TRIG 315.0 (H) 09/17/2017   HDL 36.40 (L) 09/17/2017   LDLDIRECT 121.0 09/17/2017   LDLCALC 134 (  H) 03/05/2017   ALT 12 11/22/2017   AST 17 11/22/2017   NA 141 11/22/2017   K 4.7 11/22/2017   CL 106 11/22/2017   CREATININE 0.79 11/22/2017   BUN 20 11/22/2017   CO2 27 11/22/2017   TSH 3.676 10/26/2017   INR 2.7 12/18/2017   HGBA1C 6.6 (H) 10/27/2017   MICROALBUR 1.5 10/20/2014    Lab Results  Component Value Date    TSH 3.676 10/26/2017   Lab Results  Component Value Date   WBC 8.5 11/22/2017   HGB 14.6 11/22/2017   HCT 43.9 11/22/2017   MCV 90.8 11/22/2017   PLT 340.0 11/22/2017   Lab Results  Component Value Date   NA 141 11/22/2017   K 4.7 11/22/2017   CHLORIDE 109 08/12/2013   CO2 27 11/22/2017   GLUCOSE 98 11/22/2017   BUN 20 11/22/2017   CREATININE 0.79 11/22/2017   BILITOT 0.6 11/22/2017   ALKPHOS 64 11/22/2017   AST 17 11/22/2017   ALT 12 11/22/2017   PROT 7.2 11/22/2017   ALBUMIN 4.1 11/22/2017   CALCIUM 9.7 11/22/2017   ANIONGAP 11 10/27/2017   GFR 74.07 11/22/2017   Lab Results  Component Value Date   CHOL 219 (H) 09/17/2017   Lab Results  Component Value Date   HDL 36.40 (L) 09/17/2017   Lab Results  Component Value Date   LDLCALC 134 (H) 03/05/2017   Lab Results  Component Value Date   TRIG 315.0 (H) 09/17/2017   Lab Results  Component Value Date   CHOLHDL 6 09/17/2017   Lab Results  Component Value Date   HGBA1C 6.6 (H) 10/27/2017       Assessment & Plan:   Problem List Items Addressed This Visit    Essential hypertension    Did not tolerate last change will try Metoprolol tartrate 25 mg 1/2 tab bid recheckc bp in 3 weeks      Relevant Medications   metoprolol tartrate (LOPRESSOR) 25 MG tablet   losartan (COZAAR) 100 MG tablet   Diabetes mellitus type 2 in obese (HCC)    hgba1c acceptable, minimize simple carbs. Increase exercise as tolerated.       Relevant Medications   losartan (COZAAR) 100 MG tablet   Hand injury    Has recovered well from right hand surgery.       Bronchitis    Encouraged increased rest and hydration, add probiotics, zinc such as Coldeze or Xicam. Treat fevers as needed, mucinex, vitamin c and elderberry. If no response is given an rx for Amoxicillin         I have discontinued Anniah Glick. Aulds's bacitracin, doxycycline, amoxicillin-clavulanate, labetalol, and ciprofloxacin. I have also changed her losartan.  Additionally, I am having her start on metoprolol tartrate and amoxicillin. Lastly, I am having her maintain her B Complex Vitamins (VITAMIN B COMPLEX PO), potassium chloride SA, glucose monitoring kit, Lancets, glucose blood, MULTIVITAMIN ADULTS, ONE TOUCH ULTRA 2, furosemide, warfarin, ALPRAZolam, and ondansetron.  Meds ordered this encounter  Medications  . metoprolol tartrate (LOPRESSOR) 25 MG tablet    Sig: Take 0.5 tablets (12.5 mg total) by mouth 2 (two) times daily.    Dispense:  90 tablet    Refill:  3  . losartan (COZAAR) 100 MG tablet    Sig: Take 1 tablet (100 mg total) by mouth at bedtime.    Dispense:  90 tablet    Refill:  0  . amoxicillin (AMOXIL) 500 MG capsule    Sig:  Take 1 capsule (500 mg total) by mouth 3 (three) times daily.    Dispense:  30 capsule    Refill:  0     Penni Homans, MD

## 2018-01-28 NOTE — Progress Notes (Addendum)
Subjective:   [name] Little is a 84 y.o. female who presents for Medicare Annual (Subsequent) preventive examination.  Pt enjoys meeting her ladies group at [employer] 5 days/ week. Husband still working at truck driver part time at 84 yrs old.  Review of Systems: No ROS.  Medicare Wellness Visit. Additional risk factors are reflected in the social history.   Sleep patterns:  5 hrs per night. Feels rested. Naps as needed. Home Safety/Smoke Alarms: Feels safe in home. Smoke alarms in place.  Living environment; residence and Firearm Safety: Lives with husband in 2 story home. No trouble with stairs. Step over tub with grab rails.  Female:         Mammo- utd      Dexa scan- utd        Eye: Dr. Gershon Crane yearly.     Objective:     Vitals: There were no vitals taken for this visit.  There is no height or weight on file to calculate BMI.  Advanced Directives 10/26/2017 10/21/2017 10/18/2017 08/15/2016 08/15/2016 04/17/2016 08/17/2014  Does Patient Have a Medical Advance Directive? _0  Yes No  Type of Advance Directive - - - - - Press photographer;Living will -  Copy of [place] in Chart? - - - - - No - copy requested -  Would patient like information on creating a medical advance directive? No - Patient declined - No - Patient declined No - Patient declined - - No - patient declined information  Pre-existing out of facility DNR order (yellow form or pink MOST form) - - - - - - -    Tobacco Social History   Tobacco Use  Smoking Status Never Smoker  Smokeless Tobacco Never Used     Counseling given: Not Answered   Clinical Intake:                       Past Medical History:  Diagnosis Date  . Anxiety state, unspecified 09/22/2013  . Arthritis    lumbar stenosis, radiculopathy, OA- L hip  . BCC (basal cell carcinoma), scalp/neck 09/27/2015  . Blood clotting disorder (HCC)    V5 factor  . Blood dyscrasia    factor 5  deficiency  . Breast cancer ([place]) 2010   RIGHT breast  . Clostridium difficile infection   . Clotting disorder ([place])   . Diabetes mellitus type 2 in obese ([place]) 02/26/2014  . DVT (deep venous thrombosis) ([place])   . Factor V deficiency ([place])   . Hemorrhoids   . Hyperglycemia 02/26/2014  . Hyperlipemia   . Hypertension   . Infiltrating ductal carcinoma of breast ([place])    stage one right   . Low back pain 03/26/2014  . Macular degeneration   . Mitral valve regurgitation 09/01/2016  . Mixed hyperlipidemia 05/18/2015  . Nocturia 02/26/2014  . Paroxysmal supraventricular tachycardia ([place])   . Personal history of colonic polyps    hyperplastic  . Thoracic aortic aneurysm ([place])   . TMJ pain dysfunction syndrome    Past Surgical History:  Procedure Laterality Date  . ABDOMINAL HYSTERECTOMY     1960- ? vaginal hysterectomy, not laparoscopic   . BREAST LUMPECTOMY  2010   right Dr. [name]  . COLONOSCOPY    . EYE SURGERY     bilateral cataracts- /w IOL   . I&D EXTREMITY Right 10/26/2017   Procedure: IRRIGATION AND DEBRIDEMENT RIGHT HAND;  Surgeon: Iran Planas, MD;  Location: [facility];  Service: Orthopedics;  Laterality: Right;  . JOINT REPLACEMENT     R hip replacement   . LAPAROSCOPIC HYSTERECTOMY    . LUMBAR LAMINECTOMY/DECOMPRESSION MICRODISCECTOMY  04/11/2011   Procedure: LUMBAR LAMINECTOMY/DECOMPRESSION MICRODISCECTOMY;  Surgeon: [name], MD;  Location: [facility];  Service: Neurosurgery;  Laterality: N/A;  Lumbar Five-Sacral One Microdiscectomy  . right calf surgery     for cancer  . SPLENECTOMY    . TOTAL HIP ARTHROPLASTY  1999   right  . UPPER GASTROINTESTINAL ENDOSCOPY     Family History  Problem Relation Age of Onset  . Heart disease Mother   . Heart disease Father   . Alzheimer's disease Sister   . Heart disease Unknown        uncles  . Clotting disorder Maternal Uncle   . Breast cancer Paternal Aunt   . Cancer Paternal Aunt        breast  . Colon cancer Neg Hx     . Anesthesia problems Neg Hx   . Hypotension Neg Hx   . Malignant hyperthermia Neg Hx   . Pseudochol deficiency Neg Hx    Social History   Socioeconomic History  . Marital status: Married    Spouse name: Not on file  . Number of children: 4  . Years of education: Not on file  . Highest education level: Not on file  Occupational History  . Occupation: Retired  Scientific laboratory technician  . Financial resource strain: Not on file  . Food insecurity:    Worry: Not on file    Inability: Not on file  . Transportation needs:    Medical: Not on file    Non-medical: Not on file  Tobacco Use  . Smoking status: Never Smoker  . Smokeless tobacco: Never Used  Substance and Sexual Activity  . Alcohol use: No    Alcohol/week: 0.0 standard drinks  . Drug use: No  . Sexual activity: Never    Partners: Male  Lifestyle  . Physical activity:    Days per week: Not on file    Minutes per session: Not on file  . Stress: Not on file  Relationships  . Social connections:    Talks on phone: Not on file    Gets together: Not on file    Attends religious service: Not on file    Active member of club or organization: Not on file    Attends meetings of clubs or organizations: Not on file    Relationship status: Not on file  Other Topics Concern  . Not on file  Social History Narrative   Married 1955   2 sons - '59, '61, 2 daughters '55, '57; 8 grandchildren; 1 great grand   I-ADLs       Outpatient Encounter Medications as of 01/29/2018  Medication Sig  . ALPRAZolam (XANAX) 0.25 MG tablet TAKE ONE TABLET BY MOUTH TWICE DIALY AS NEEDED FOR ANXIETY (Patient taking differently: Take 0.25 mg by mouth daily as needed for anxiety. )  . amoxicillin (AMOXIL) 500 MG capsule Take 1 capsule (500 mg total) by mouth 3 (three) times daily.  . B Complex Vitamins (VITAMIN B COMPLEX PO) Take 1 tablet by mouth daily.   . Blood Glucose Monitoring Suppl (ONE TOUCH ULTRA 2) w/Device KIT See admin instructions.  .  furosemide (LASIX) 20 MG tablet Take 1 tablet (20 mg total) by mouth daily as needed for fluid or edema.  Marland Kitchen glucose blood (IGLUCOSE TEST STRIPS) test  strip Test Blood Sugars every day as needed  . glucose monitoring kit (FREESTYLE) monitoring kit 1 each by Does not apply route as needed for other.  . Lancets MISC Test Blood Sugars every day as needed  . losartan (COZAAR) 100 MG tablet Take 1 tablet (100 mg total) by mouth at bedtime.  . metoprolol tartrate (LOPRESSOR) 25 MG tablet Take 0.5 tablets (12.5 mg total) by mouth 2 (two) times daily.  . Multiple Vitamins-Minerals (MULTIVITAMIN ADULTS) TABS Take 1 tablet by mouth daily.  . ondansetron (ZOFRAN ODT) 4 MG disintegrating tablet Take 1 tablet (4 mg total) by mouth every 8 (eight) hours as needed for nausea or vomiting.  . potassium chloride SA (K-DUR,KLOR-CON) 20 MEQ tablet Take 1 tablet (20 mEq total) by mouth daily as needed (lasix use).  Marland Kitchen warfarin (COUMADIN) 5 MG tablet Take 1 tablet (5 mg) by mouth daily except on Mon Take 0.5 tablet (2.5 mg) (Patient taking differently: Take 2.5-5 mg by mouth See admin instructions. Take 1 tablet (5 mg) by mouth daily except on Sun Take 0.5 tablet (2.5 mg))   No facility-administered encounter medications on file as of 01/29/2018.     Activities of Daily Living In your present state of health, do you have any difficulty performing the following activities: 10/27/2017 10/26/2017  Hearing? N N  Vision? N N  Difficulty concentrating or making decisions? N N  Walking or climbing stairs? N N  Dressing or bathing? Y Y  Doing errands, shopping? - N  Some recent data might be hidden    Patient Care Team: Mosie Lukes, MD as PCP - General (Family Medicine) Zadie Rhine Clent Demark, MD as Consulting Physician (Ophthalmology)    Assessment:   This is a routine wellness examination for [name] Little. Physical assessment deferred to PCP.  Exercise Activities and Dietary recommendations   Diet (meal preparation, eat out,  water intake, caffeinated beverages, dairy products, fruits and vegetables):  Breakfast: cheerios and water. Lunch: chicken sandwich and fruit. water Dinner: green beans and potatoes. Blueberries.   Goals    . <enter goal here> (pt-stated)     Lose 20lb with diet and exercise.    . Increase physical activity     Increase walking--walk in neighborhood park        Fall Risk Fall Risk  10/18/2017 04/17/2016 07/16/2015 08/17/2014 07/13/2014  Falls in the past year? No Yes No No No  Number falls in past yr: - 1 - - -  Injury with Fall? - No - - -  Follow up - Education provided;Falls prevention discussed - - -    Depression Screen PHQ 2/9 Scores 10/18/2017 04/17/2016 07/16/2015 08/17/2014  PHQ - 2 Score 0 0 0 0     Cognitive Function MMSE - Mini Mental State Exam 04/17/2016  Orientation to time 5  Orientation to Place 5  Registration 3  Attention/ Calculation 5  Recall 2  Language- name 2 objects 2  Language- repeat 1  Language- follow 3 step command 3  Language- read & follow direction 1  Write a sentence 1  Copy design 1  Total score 29        Immunization History  Administered Date(s) Administered  . Influenza Split 11/14/2010  . Influenza Whole 11/08/2005, 10/28/2007, 11/23/2008, 11/26/2009  . Influenza, High Dose Seasonal PF 10/19/2015, 10/16/2016, 11/22/2017  . Influenza,inj,Quad PF,6+ Mos 11/26/2012, 09/22/2013, 10/20/2014  . Pneumococcal Conjugate-13 04/01/2013  . Pneumococcal Polysaccharide-23 10/30/2001, 07/27/2009  . Td 11/27/2001  . Tdap 10/18/2017  .  Tetanus 04/01/2013  . Zoster Recombinat (Shingrix) 03/13/2017    Screening Tests Health Maintenance  Topic Date Due  . OPHTHALMOLOGY EXAM  08/10/2015  . FOOT EXAM  04/17/2017  . HEMOGLOBIN A1C  04/27/2018  . TETANUS/TDAP  10/19/2027  . INFLUENZA VACCINE  Completed  . DEXA SCAN  Completed  . PNA vac Low Risk Adult  Completed       Plan:    Please schedule your next medicare wellness visit with me in  1 yr.  BP is elevated and pt c/o blood in urine x 1 week. Will discuss with PCP today. PCP appt directly following this appt.  Continue to eat heart healthy diet (full of fruits, vegetables, whole grains, lean protein, water--limit salt, fat, and sugar intake) and increase physical activity as tolerated.  Continue doing brain stimulating activities (puzzles, reading, adult coloring books, staying active) to keep memory sharp.     I have personally reviewed and noted the following in the patient's chart:   . Medical and social history . Use of alcohol, tobacco or illicit drugs  . Current medications and supplements . Functional ability and status . Nutritional status . Physical activity . Advanced directives . List of other physicians . Hospitalizations, surgeries, and ER visits in previous 12 months . Vitals . Screenings to include cognitive, depression, and falls . Referrals and appointments  In addition, I have reviewed and discussed with patient certain preventive protocols, quality metrics, and best practice recommendations. A written personalized care plan for preventive services as well as general preventive health recommendations were provided to patient.     Shela Nevin, [place]  01/28/2018   Medical screening examination/treatment was performed by qualified clinical staff member and as supervising physician I was immediately available for consultation/collaboration. I have reviewed documentation and agree with assessment and plan.  Penni Homans, MD

## 2018-01-29 ENCOUNTER — Ambulatory Visit (INDEPENDENT_AMBULATORY_CARE_PROVIDER_SITE_OTHER): Payer: Medicare Other | Admitting: *Deleted

## 2018-01-29 ENCOUNTER — Encounter: Payer: Self-pay | Admitting: Family Medicine

## 2018-01-29 ENCOUNTER — Encounter: Payer: Self-pay | Admitting: *Deleted

## 2018-01-29 ENCOUNTER — Ambulatory Visit: Payer: Medicare Other

## 2018-01-29 ENCOUNTER — Ambulatory Visit: Payer: Medicare Other | Admitting: Family Medicine

## 2018-01-29 VITALS — BP 168/100 | HR 75 | Temp 97.9°F | Resp 18 | Wt 171.9 lb

## 2018-01-29 VITALS — BP 168/100 | HR 75 | Ht 63.0 in | Wt 171.6 lb

## 2018-01-29 DIAGNOSIS — R300 Dysuria: Secondary | ICD-10-CM

## 2018-01-29 DIAGNOSIS — E669 Obesity, unspecified: Secondary | ICD-10-CM

## 2018-01-29 DIAGNOSIS — I10 Essential (primary) hypertension: Secondary | ICD-10-CM | POA: Diagnosis not present

## 2018-01-29 DIAGNOSIS — N200 Calculus of kidney: Secondary | ICD-10-CM

## 2018-01-29 DIAGNOSIS — R319 Hematuria, unspecified: Secondary | ICD-10-CM

## 2018-01-29 DIAGNOSIS — Z1211 Encounter for screening for malignant neoplasm of colon: Secondary | ICD-10-CM

## 2018-01-29 DIAGNOSIS — Z0000 Encounter for general adult medical examination without abnormal findings: Secondary | ICD-10-CM

## 2018-01-29 DIAGNOSIS — E1169 Type 2 diabetes mellitus with other specified complication: Secondary | ICD-10-CM | POA: Diagnosis not present

## 2018-01-29 LAB — URINALYSIS, ROUTINE W REFLEX MICROSCOPIC
Ketones, ur: NEGATIVE
Leukocytes, UA: NEGATIVE
Nitrite: NEGATIVE
Specific Gravity, Urine: 1.01 (ref 1.000–1.030)
Total Protein, Urine: 100 — AB
Urine Glucose: NEGATIVE
Urobilinogen, UA: 1 (ref 0.0–1.0)
pH: 6.5 (ref 5.0–8.0)

## 2018-01-29 MED ORDER — BLOOD PRESSURE KIT DEVI: 1.0000 | Freq: Every day | 0 refills | Status: DC | PRN

## 2018-01-29 MED ORDER — METOPROLOL SUCCINATE ER 50 MG PO TB24: 50.0000 mg | ORAL_TABLET | Freq: Every day | ORAL | 1 refills | Status: DC

## 2018-01-29 MED ORDER — AMOXICILLIN-POT CLAVULANATE 875-125 MG PO TABS: 1.0000 | ORAL_TABLET | Freq: Two times a day (BID) | ORAL | 0 refills | Status: DC

## 2018-01-29 NOTE — Patient Instructions (Signed)
Please schedule your next medicare wellness visit with me in 1 yr.  Continue doing brain stimulating activities (puzzles, reading, adult coloring books, staying active) to keep memory sharp.   Continue to eat heart healthy diet (full of fruits, vegetables, whole grains, lean protein, water--limit salt, fat, and sugar intake) and increase physical activity as tolerated.   [name] , Thank you for taking time to come for your Medicare Wellness Visit. I appreciate your ongoing commitment to your health goals. Please review the following plan we discussed and let me know if I can assist you in the future.   These are the goals we discussed: Goals    . Increase physical activity     Increase walking--walk in neighborhood park        This is a list of the screening recommended for you and due dates:  Health Maintenance  Topic Date Due  . Eye exam for diabetics  08/10/2015  . Complete foot exam   04/17/2017  . Hemoglobin A1C  04/27/2018  . Tetanus Vaccine  10/19/2027  . Flu Shot  Completed  . DEXA scan (bone density measurement)  Completed  . Pneumonia vaccines  Completed    Health Maintenance After Age 82 After age 73, you are at a higher risk for certain long-term diseases and infections as well as injuries from falls. Falls are a major cause of broken bones and head injuries in people who are older than age 36. Getting regular preventive care can help to keep you healthy and well. Preventive care includes getting regular testing and making lifestyle changes as recommended by your health care provider. Talk with your health care provider about:  Which screenings and tests you should have. A screening is a test that checks for a disease when you have no symptoms.  A diet and exercise plan that is right for you. What should I know about screenings and tests to prevent falls? Screening and testing are the best ways to find a health problem early. Early diagnosis and treatment give you  the best chance of managing medical conditions that are common after age 42. Certain conditions and lifestyle choices may make you more likely to have a fall. Your health care provider may recommend:  Regular vision checks. Poor vision and conditions such as cataracts can make you more likely to have a fall. If you wear glasses, make sure to get your prescription updated if your vision changes.  Medicine review. Work with your health care provider to regularly review all of the medicines you are taking, including over-the-counter medicines. Ask your health care provider about any side effects that may make you more likely to have a fall. Tell your health care provider if any medicines that you take make you feel dizzy or sleepy.  Osteoporosis screening. Osteoporosis is a condition that causes the bones to get weaker. This can make the bones weak and cause them to break more easily.  Blood pressure screening. Blood pressure changes and medicines to control blood pressure can make you feel dizzy.  Strength and balance checks. Your health care provider may recommend certain tests to check your strength and balance while standing, walking, or changing positions.  Foot health exam. Foot pain and numbness, as well as not wearing proper footwear, can make you more likely to have a fall.  Depression screening. You may be more likely to have a fall if you have a fear of falling, feel emotionally low, or feel unable to do activities  that you used to do.  Alcohol use screening. Using too much alcohol can affect your balance and may make you more likely to have a fall. What actions can I take to lower my risk of falls? General instructions  Talk with your health care provider about your risks for falling. Tell your health care provider if: ? You fall. Be sure to tell your health care provider about all falls, even ones that seem minor. ? You feel dizzy, sleepy, or off-balance.  Take over-the-counter and  prescription medicines only as told by your health care provider. These include any supplements.  Eat a healthy diet and maintain a healthy weight. A healthy diet includes low-fat dairy products, low-fat (lean) meats, and fiber from whole grains, beans, and lots of fruits and vegetables. Home safety  Remove any tripping hazards, such as rugs, cords, and clutter.  Install safety equipment such as grab bars in bathrooms and safety rails on stairs.  Keep rooms and walkways well-lit. Activity   Follow a regular exercise program to stay fit. This will help you maintain your balance. Ask your health care provider what types of exercise are appropriate for you.  If you need a cane or walker, use it as recommended by your health care provider.  Wear supportive shoes that have nonskid soles. Lifestyle  Do not drink alcohol if your health care provider tells you not to drink.  If you drink alcohol, limit how much you have: ? 0-1 drink a day for women. ? 0-2 drinks a day for men.  Be aware of how much alcohol is in your drink. In the [place] one drink equals one typical bottle of beer (12 oz), one-half glass of wine (5 oz), or one shot of hard liquor (1 oz).  Do not use any products that contain nicotine or tobacco, such as cigarettes and e-cigarettes. If you need help quitting, ask your health care provider. Summary  Having a healthy lifestyle and getting preventive care can help to protect your health and wellness after age 79.  Screening and testing are the best way to find a health problem early and help you avoid having a fall. Early diagnosis and treatment give you the best chance for managing medical conditions that are more common for people who are older than age 63.  Falls are a major cause of broken bones and head injuries in people who are older than age 13. Take precautions to prevent a fall at home.  Work with your health care provider to learn what changes you can make to  improve your health and wellness and to prevent falls. This information is not intended to replace advice given to you by your health care provider. Make sure you discuss any questions you have with your health care provider. Document Released: 11/29/2016 Document Revised: 11/29/2016 Document Reviewed: 11/29/2016 Elsevier Interactive Patient Education  2019 [employer].

## 2018-01-29 NOTE — Assessment & Plan Note (Signed)
hgba1c acceptable, minimize simple carbs. Increase exercise as tolerated. Continue current meds 

## 2018-01-29 NOTE — Assessment & Plan Note (Addendum)
Check urinalysis and culture and treat empirically til results

## 2018-01-29 NOTE — Patient Instructions (Signed)
NOW probiotic one daily or Culturelle, [facility] colon health Hematuria, Adult Hematuria is blood in the urine. Blood may be visible in the urine, or it may be identified with a test. This condition can be caused by infections of the bladder, urethra, kidney, or prostate. Other possible causes include:  Kidney stones.  Cancer of the urinary tract.  Too much calcium in the urine.  Conditions that are passed from parent to child (inherited conditions).  Exercise that requires a lot of energy. Infections can usually be treated with medicine, and a kidney stone usually will pass through your urine. If neither of these is the cause of your hematuria, more tests may be needed to identify the cause of your symptoms. It is very important to tell your health care provider about any blood in your urine, even if it is painless or the blood stops without treatment. Blood in the urine, when it happens and then stops and then happens again, can be a symptom of a very serious condition, including cancer. There is no pain in the initial stages of many urinary cancers. Follow these instructions at home: Medicines  Take over-the-counter and prescription medicines only as told by your health care provider.  If you were prescribed an antibiotic medicine, take it as told by your health care provider. Do not stop taking the antibiotic even if you start to feel better. Eating and drinking  Drink enough fluid to keep your urine clear or pale yellow. It is recommended that you drink 3-4 quarts (2.8-3.8 L) a day. If you have been diagnosed with an infection, it is recommended that you drink cranberry juice in addition to large amounts of water.  Avoid caffeine, tea, and carbonated beverages. These tend to irritate the bladder.  Avoid alcohol because it may irritate the prostate (men). General instructions  If you have been diagnosed with a kidney stone, follow your health care provider's instructions about  straining your urine to catch the stone.  Empty your bladder often. Avoid holding urine for long periods of time.  If you are female: ? After a bowel movement, wipe from front to back and use each piece of toilet paper only once. ? Empty your bladder before and after sex.  Pay attention to any changes in your symptoms. Tell your health care provider about any changes or any new symptoms.  It is your responsibility to get your test results. Ask your health care provider, or the department performing the test, when your results will be ready.  Keep all follow-up visits as told by your health care provider. This is important. Contact a health care provider if:  You develop back pain.  You have a fever.  You have nausea or vomiting.  Your symptoms do not improve after 3 days.  Your symptoms get worse. Get help right away if:  You develop severe vomiting and are unable take medicine without vomiting.  You develop severe pain in your back or abdomen even though you are taking medicine.  You pass a large amount of blood in your urine.  You pass blood clots in your urine.  You feel very weak or like you might faint.  You faint. Summary  Hematuria is blood in the urine. It has many possible causes.  It is very important that you tell your health care provider about any blood in your urine, even if it is painless or the blood stops without treatment.  Take over-the-counter and prescription medicines only as told by your  health care provider.  Drink enough fluid to keep your urine clear or pale yellow. This information is not intended to replace advice given to you by your health care provider. Make sure you discuss any questions you have with your health care provider. Document Released: 01/16/2005 Document Revised: 02/19/2016 Document Reviewed: 02/19/2016 Elsevier Interactive Patient Education  2019 [employer].

## 2018-01-29 NOTE — Assessment & Plan Note (Addendum)
Well controlled, no changes to meds. Encouraged heart healthy diet such as the DASH diet and exercise as tolerated. Does not feel well after am dose of Metoprolol so will switch to succinate and have her take it in the evening.

## 2018-01-31 LAB — URINE CULTURE
MICRO NUMBER:: 91556517
SPECIMEN QUALITY:: ADEQUATE

## 2018-01-31 NOTE — Assessment & Plan Note (Signed)
No symptoms of pain suggesting mo movement.

## 2018-01-31 NOTE — Progress Notes (Signed)
Subjective:    Patient ID: [name] Little, female    DOB: Oct 15, 1933, 85 y.o.   MRN: [id number]  No chief complaint on file.   HPI Patient is in today for evaluation of urinary tract infection. She has been noting some discomfort in lower abdomen. No fevers or chills. Notes some fatigue but no flank pain. Mostly notes hematuria off and on for about a week. Denies CP/palp/SOB/HA/congestion/fevers/GI c/o. Taking meds as prescribed.notes some mild intermittent dysuria.   Past Medical History:  Diagnosis Date  . Anxiety state, unspecified 09/22/2013  . Arthritis    lumbar stenosis, radiculopathy, OA- L hip  . BCC (basal cell carcinoma), scalp/neck 09/27/2015  . Blood clotting disorder (HCC)    V5 factor  . Blood dyscrasia    factor 5 deficiency  . Breast cancer ([place]) 2010   RIGHT breast  . Clostridium difficile infection   . Clotting disorder ([place])   . Diabetes mellitus type 2 in obese ([place]) 02/26/2014  . DVT (deep venous thrombosis) ([place])   . Factor V deficiency ([place])   . Hemorrhoids   . Hyperglycemia 02/26/2014  . Hyperlipemia   . Hypertension   . Infiltrating ductal carcinoma of breast ([place])    stage one right   . Low back pain 03/26/2014  . Macular degeneration   . Mitral valve regurgitation 09/01/2016  . Mixed hyperlipidemia 05/18/2015  . Nocturia 02/26/2014  . Paroxysmal supraventricular tachycardia ([place])   . Personal history of colonic polyps    hyperplastic  . Thoracic aortic aneurysm ([place])   . TMJ pain dysfunction syndrome     Past Surgical History:  Procedure Laterality Date  . ABDOMINAL HYSTERECTOMY     1960- ? vaginal hysterectomy, not laparoscopic   . BREAST LUMPECTOMY  2010   right Dr. [name]  . COLONOSCOPY    . EYE SURGERY     bilateral cataracts- /w IOL   . I&D EXTREMITY Right 10/26/2017   Procedure: IRRIGATION AND DEBRIDEMENT RIGHT HAND;  Surgeon: Iran Planas, MD;  Location: [facility];  Service: Orthopedics;  Laterality: Right;  . JOINT REPLACEMENT     R  hip replacement   . LAPAROSCOPIC HYSTERECTOMY    . LUMBAR LAMINECTOMY/DECOMPRESSION MICRODISCECTOMY  04/11/2011   Procedure: LUMBAR LAMINECTOMY/DECOMPRESSION MICRODISCECTOMY;  Surgeon: [name], MD;  Location: [facility];  Service: Neurosurgery;  Laterality: N/A;  Lumbar Five-Sacral One Microdiscectomy  . right calf surgery     for cancer  . SPLENECTOMY    . TOTAL HIP ARTHROPLASTY  1999   right  . UPPER GASTROINTESTINAL ENDOSCOPY      Family History  Problem Relation Age of Onset  . Heart disease Mother   . Heart disease Father   . Alzheimer's disease Sister   . Heart disease Other        uncles  . Clotting disorder Maternal Uncle   . Breast cancer Paternal Aunt   . Cancer Paternal Aunt        breast  . Colon cancer Neg Hx   . Anesthesia problems Neg Hx   . Hypotension Neg Hx   . Malignant hyperthermia Neg Hx   . Pseudochol deficiency Neg Hx     Social History   Socioeconomic History  . Marital status: Married    Spouse name: Not on file  . Number of children: 4  . Years of education: Not on file  . Highest education level: Not on file  Occupational History  . Occupation: Retired  Scientific laboratory technician  .  Financial resource strain: Not on file  . Food insecurity:    Worry: Not on file    Inability: Not on file  . Transportation needs:    Medical: Not on file    Non-medical: Not on file  Tobacco Use  . Smoking status: Never Smoker  . Smokeless tobacco: Never Used  Substance and Sexual Activity  . Alcohol use: No    Alcohol/week: 0.0 standard drinks  . Drug use: No  . Sexual activity: Never    Partners: Male  Lifestyle  . Physical activity:    Days per week: Not on file    Minutes per session: Not on file  . Stress: Not on file  Relationships  . Social connections:    Talks on phone: Not on file    Gets together: Not on file    Attends religious service: Not on file    Active member of club or organization: Not on file    Attends meetings of clubs or  organizations: Not on file    Relationship status: Not on file  . Intimate partner violence:    Fear of current or ex partner: Not on file    Emotionally abused: Not on file    Physically abused: Not on file    Forced sexual activity: Not on file  Other Topics Concern  . Not on file  Social History Narrative   Married 1955   2 sons - '59, '61, 2 daughters '55, '57; 8 grandchildren; 1 great grand   I-ADLs       Outpatient Medications Prior to Visit  Medication Sig Dispense Refill  . ALPRAZolam (XANAX) 0.25 MG tablet TAKE ONE TABLET BY MOUTH TWICE DIALY AS NEEDED FOR ANXIETY (Patient taking differently: Take 0.25 mg by mouth daily as needed for anxiety. ) 20 tablet 1  . amoxicillin (AMOXIL) 500 MG capsule Take 1 capsule (500 mg total) by mouth 3 (three) times daily. 30 capsule 0  . B Complex Vitamins (VITAMIN B COMPLEX PO) Take 1 tablet by mouth daily.     . Blood Glucose Monitoring Suppl (ONE TOUCH ULTRA 2) w/Device KIT See admin instructions.  0  . furosemide (LASIX) 20 MG tablet Take 1 tablet (20 mg total) by mouth daily as needed for fluid or edema. 30 tablet 1  . glucose blood (IGLUCOSE TEST STRIPS) test strip Test Blood Sugars every day as needed 100 each 5  . glucose monitoring kit (FREESTYLE) monitoring kit 1 each by Does not apply route as needed for other. 1 each 0  . Lancets MISC Test Blood Sugars every day as needed 100 each 5  . losartan (COZAAR) 100 MG tablet Take 1 tablet (100 mg total) by mouth at bedtime. 90 tablet 0  . Multiple Vitamins-Minerals (MULTIVITAMIN ADULTS) TABS Take 1 tablet by mouth daily.    . ondansetron (ZOFRAN ODT) 4 MG disintegrating tablet Take 1 tablet (4 mg total) by mouth every 8 (eight) hours as needed for nausea or vomiting. (Patient not taking: Reported on 01/29/2018) 20 tablet 0  . potassium chloride SA (K-DUR,KLOR-CON) 20 MEQ tablet Take 1 tablet (20 mEq total) by mouth daily as needed (lasix use). 30 tablet 0  . warfarin (COUMADIN) 5 MG tablet  Take 1 tablet (5 mg) by mouth daily except on Mon Take 0.5 tablet (2.5 mg) (Patient taking differently: Take 2.5-5 mg by mouth See admin instructions. Take 1 tablet (5 mg) by mouth daily except on Sun Take 0.5 tablet (2.5 mg)) 90 tablet 3  .  metoprolol tartrate (LOPRESSOR) 25 MG tablet Take 0.5 tablets (12.5 mg total) by mouth 2 (two) times daily. 90 tablet 3   No facility-administered medications prior to visit.     Allergies  Allergen Reactions  . Hydralazine Other (See Comments)    Joint and chest pain  . Amlodipine Swelling    "my whole body swelled"  . Carvedilol Swelling    "my whole body swelled"  . Cefuroxime Axetil     REACTION: Ddoes not remember reaction  . Chlorthalidone     Patient reported extreme weakness.  . Statins Other (See Comments)    Weakness, pain Lipitor and Pravachol Weakness, pain Lipitor and Pravachol  . Sulfonamide Derivatives     REACTION: Rash    Review of Systems  Constitutional: Positive for malaise/fatigue. Negative for fever.  HENT: Negative for congestion.   Eyes: Negative for blurred vision.  Respiratory: Negative for shortness of breath.   Cardiovascular: Negative for chest pain, palpitations and leg swelling.  Gastrointestinal: Positive for abdominal pain. Negative for blood in stool and nausea.  Genitourinary: Positive for dysuria and hematuria. Negative for flank pain, frequency and urgency.  Musculoskeletal: Negative for falls.  Skin: Negative for rash.  Neurological: Negative for dizziness, loss of consciousness and headaches.  Endo/Heme/Allergies: Negative for environmental allergies.  Psychiatric/Behavioral: Negative for depression. The patient is not nervous/anxious.        Objective:    Physical Exam Vitals signs and nursing note reviewed.  Constitutional:      General: She is not in acute distress.    Appearance: She is well-developed.  HENT:     Head: Normocephalic and atraumatic.     Nose: Nose normal.  Eyes:      General:        Right eye: No discharge.        Left eye: No discharge.  Neck:     Musculoskeletal: Normal range of motion and neck supple.  Cardiovascular:     Rate and Rhythm: Normal rate and regular rhythm.     Heart sounds: No murmur.  Pulmonary:     Effort: Pulmonary effort is normal.     Breath sounds: Normal breath sounds.  Abdominal:     General: Bowel sounds are normal.     Palpations: Abdomen is soft.     Tenderness: There is no abdominal tenderness.  Skin:    General: Skin is warm and dry.  Neurological:     Mental Status: She is alert and oriented to person, place, and time.     BP (!) 168/100 (BP Location: Left Arm, Patient Position: Sitting, Cuff Size: Normal)   Pulse 75   Temp 97.9 F (36.6 C) (Oral)   Resp 18   Wt 171 lb 14.4 oz (78 kg)   SpO2 97%   BMI 30.45 kg/m  Wt Readings from Last 3 Encounters:  01/29/18 171 lb 14.4 oz (78 kg)  01/29/18 171 lb 9.6 oz (77.8 kg)  12/24/17 171 lb (77.6 kg)     Lab Results  Component Value Date   WBC 8.5 11/22/2017   HGB 14.6 11/22/2017   HCT 43.9 11/22/2017   PLT 340.0 11/22/2017   GLUCOSE 98 11/22/2017   CHOL 219 (H) 09/17/2017   TRIG 315.0 (H) 09/17/2017   HDL 36.40 (L) 09/17/2017   LDLDIRECT 121.0 09/17/2017   LDLCALC 134 (H) 03/05/2017   ALT 12 11/22/2017   AST 17 11/22/2017   NA 141 11/22/2017   K 4.7 11/22/2017   CL  106 11/22/2017   CREATININE 0.79 11/22/2017   BUN 20 11/22/2017   CO2 27 11/22/2017   TSH 3.676 10/26/2017   INR 2.7 12/18/2017   HGBA1C 6.6 (H) 10/27/2017   MICROALBUR 1.5 10/20/2014    Lab Results  Component Value Date   TSH 3.676 10/26/2017   Lab Results  Component Value Date   WBC 8.5 11/22/2017   HGB 14.6 11/22/2017   HCT 43.9 11/22/2017   MCV 90.8 11/22/2017   PLT 340.0 11/22/2017   Lab Results  Component Value Date   NA 141 11/22/2017   K 4.7 11/22/2017   CHLORIDE 109 08/12/2013   CO2 27 11/22/2017   GLUCOSE 98 11/22/2017   BUN 20 11/22/2017   CREATININE  0.79 11/22/2017   BILITOT 0.6 11/22/2017   ALKPHOS 64 11/22/2017   AST 17 11/22/2017   ALT 12 11/22/2017   PROT 7.2 11/22/2017   ALBUMIN 4.1 11/22/2017   CALCIUM 9.7 11/22/2017   ANIONGAP 11 10/27/2017   GFR 74.07 11/22/2017   Lab Results  Component Value Date   CHOL 219 (H) 09/17/2017   Lab Results  Component Value Date   HDL 36.40 (L) 09/17/2017   Lab Results  Component Value Date   LDLCALC 134 (H) 03/05/2017   Lab Results  Component Value Date   TRIG 315.0 (H) 09/17/2017   Lab Results  Component Value Date   CHOLHDL 6 09/17/2017   Lab Results  Component Value Date   HGBA1C 6.6 (H) 10/27/2017       Assessment & Plan:   Problem List Items Addressed This Visit    Essential hypertension    Well controlled, no changes to meds. Encouraged heart healthy diet such as the DASH diet and exercise as tolerated. Does not feel well after am dose of Metoprolol so will switch to succinate and have her take it in the evening.       Relevant Medications   metoprolol succinate (TOPROL-XL) 50 MG 24 hr tablet   Diabetes mellitus type 2 in obese (HCC)    hgba1c acceptable, minimize simple carbs. Increase exercise as tolerated. Continue current meds      Kidney stones    No symptoms of pain suggesting mo movement.       Hematuria - Primary    Check urinalysis and culture and treat empirically til results       Relevant Orders   Urinalysis   Urine Culture    Other Visit Diagnoses    Dysuria       Relevant Orders   Urinalysis   Urine Culture   Colon cancer screening          I have discontinued Kathrine Cords. Dekay's metoprolol tartrate. I am also having her start on Blood Pressure Kit, metoprolol succinate, and amoxicillin-clavulanate. Additionally, I am having her maintain her B Complex Vitamins (VITAMIN B COMPLEX PO), potassium chloride SA, glucose monitoring kit, Lancets, glucose blood, MULTIVITAMIN ADULTS, ONE TOUCH ULTRA 2, furosemide, warfarin, ALPRAZolam,  ondansetron, losartan, and amoxicillin.  Meds ordered this encounter  Medications  . Blood Pressure Monitoring (BLOOD PRESSURE KIT) DEVI    Sig: 1 Device by Does not apply route daily as needed. Fill per patient insurance preference Dx: Hypertension    Dispense:  1 Device    Refill:  0  . metoprolol succinate (TOPROL-XL) 50 MG 24 hr tablet    Sig: Take 1 tablet (50 mg total) by mouth daily. Take with or immediately following a meal.    Dispense:  90 tablet    Refill:  1  . amoxicillin-clavulanate (AUGMENTIN) 875-125 MG tablet    Sig: Take 1 tablet by mouth 2 (two) times daily.    Dispense:  20 tablet    Refill:  0     Penni Homans, MD

## 2018-02-01 ENCOUNTER — Telehealth: Payer: Self-pay | Admitting: Family Medicine

## 2018-02-01 NOTE — Telephone Encounter (Signed)
Copied from [place]. Topic: Quick Communication - See Telephone Encounter >> Feb 01, 2018  4:57 PM Rutherford Nail, NT wrote: CRM for notification. See Telephone encounter for: 02/01/18. Patient calling and would like a phone call to go over results from 01/29/18. States that she is still not feeling well.

## 2018-02-04 ENCOUNTER — Ambulatory Visit (INDEPENDENT_AMBULATORY_CARE_PROVIDER_SITE_OTHER): Payer: Medicare Other | Admitting: Family Medicine

## 2018-02-04 ENCOUNTER — Telehealth: Payer: Self-pay | Admitting: *Deleted

## 2018-02-04 VITALS — BP 170/98 | HR 79

## 2018-02-04 DIAGNOSIS — I10 Essential (primary) hypertension: Secondary | ICD-10-CM | POA: Diagnosis not present

## 2018-02-04 NOTE — Progress Notes (Addendum)
Pre visit review using our clinic tool,if applicable. No additional management support is needed unless otherwise documented below in the visit note.   Patient walk in with husband for appointment. Requested BP check.  BP= 170/98  P= 76  Patient takes BP medication at hs.  Per Dr. Charlett Blake patient to increase Metoprolol Succ. to 100 mg q hs and return in 1 week for BP check.   Patient agreed appointment scheduled.blood check note reviewed.   Reviewed nursing note and Agree with documention and plan.

## 2018-02-04 NOTE — Telephone Encounter (Signed)
U/A and culture results mailed to pt.

## 2018-02-04 NOTE — Telephone Encounter (Signed)
Copied from [place] [id number]. Topic: General - Other >> Feb 04, 2018  4:58 PM Valla Leaver wrote: Reason for CRM: Patient wants her last urinalysis results mailed to her address.

## 2018-02-05 ENCOUNTER — Encounter: Payer: Self-pay | Admitting: Family Medicine

## 2018-02-05 ENCOUNTER — Ambulatory Visit: Payer: Medicare Other | Admitting: General Practice

## 2018-02-05 DIAGNOSIS — Z7901 Long term (current) use of anticoagulants: Secondary | ICD-10-CM | POA: Diagnosis not present

## 2018-02-05 DIAGNOSIS — Z86718 Personal history of other venous thrombosis and embolism: Secondary | ICD-10-CM

## 2018-02-05 LAB — POCT INR: INR: 3.6 — AB (ref 2.0–3.0)

## 2018-02-05 NOTE — Telephone Encounter (Signed)
Patient seen pcp for nurse visit and issued was discussed

## 2018-02-05 NOTE — Patient Instructions (Addendum)
Pre visit review using our clinic review tool, if applicable. No additional management support is needed unless otherwise documented below in the visit note.  Hold coumadin today (1/7) and take 1/2 tablet tomorrow (1/8) and then change dosage and take 1 tablet daily with 1/2 tablet on Mondays and Fridays.  Re-check in 2 weeks.

## 2018-02-12 ENCOUNTER — Other Ambulatory Visit: Payer: Self-pay | Admitting: *Deleted

## 2018-02-12 ENCOUNTER — Ambulatory Visit (INDEPENDENT_AMBULATORY_CARE_PROVIDER_SITE_OTHER): Payer: Medicare Other | Admitting: Family Medicine

## 2018-02-12 ENCOUNTER — Ambulatory Visit: Payer: Medicare Other

## 2018-02-12 DIAGNOSIS — I10 Essential (primary) hypertension: Secondary | ICD-10-CM

## 2018-02-12 DIAGNOSIS — R319 Hematuria, unspecified: Secondary | ICD-10-CM

## 2018-02-12 LAB — POCT URINALYSIS DIPSTICK
Glucose, UA: NEGATIVE
Ketones, UA: NEGATIVE
Leukocytes, UA: NEGATIVE
Nitrite, UA: NEGATIVE
Protein, UA: POSITIVE — AB
Spec Grav, UA: >=1.03 — AB (ref 1.010–1.025)
Urobilinogen, UA: 1 E.U./dL
pH, UA: 5.5 (ref 5.0–8.0)

## 2018-02-12 MED ORDER — CIPROFLOXACIN HCL 250 MG PO TABS: 250.0000 mg | ORAL_TABLET | Freq: Two times a day (BID) | ORAL | 0 refills | Status: DC

## 2018-02-12 NOTE — Addendum Note (Signed)
Addended by: Bunnie Domino on: 02/12/2018 10:45 AM   Modules accepted: Orders

## 2018-02-12 NOTE — Addendum Note (Signed)
Addended by: Magdalene Molly A on: 02/12/2018 02:11 PM   Modules accepted: Orders

## 2018-02-12 NOTE — Progress Notes (Addendum)
Pre visit review using our clinic tool,if applicable. No additional management support is needed unless otherwise documented below in the visit note.   Pt here for Blood pressure check per order from Dr. Charlett Blake.  Pt currently takes: Metoprolol Suc. 2100 mg and Losartan 100 mg qhs for BP.   Pt reports compliance with medication. States she is still having hematuria after treatment. No pain per patient but she does feel weakness.  BP today  = 162/78 HR = 64  Home BPs range from 147/74-154/74  Pt advised per Dr. Charlett Blake to continue BP medication as ordered and return for BP check in 2 weeks. Appointment scheduled.   Urine collected from patient to run a U/A and UC. Per Dr. Charlett Blake will call patient after we get results.   Nursing blood pressure check note reviewed. Agree with documention and plan.

## 2018-02-12 NOTE — Addendum Note (Signed)
Addended by: Bunnie Domino on: 02/12/2018 10:34 AM   Modules accepted: Level of Service

## 2018-02-12 NOTE — Addendum Note (Signed)
Addended by: Kelle Darting A on: 02/12/2018 11:42 AM   Modules accepted: Orders

## 2018-02-12 NOTE — Addendum Note (Signed)
Addended by: Bunnie Domino on: 02/12/2018 10:21 AM   Modules accepted: Orders

## 2018-02-13 ENCOUNTER — Encounter: Payer: Self-pay | Admitting: Family Medicine

## 2018-02-13 LAB — URINE CULTURE
MICRO NUMBER:: 52655
SPECIMEN QUALITY:: ADEQUATE

## 2018-02-19 ENCOUNTER — Ambulatory Visit: Payer: Medicare Other | Admitting: General Practice

## 2018-02-19 DIAGNOSIS — Z86718 Personal history of other venous thrombosis and embolism: Secondary | ICD-10-CM

## 2018-02-19 DIAGNOSIS — Z7901 Long term (current) use of anticoagulants: Secondary | ICD-10-CM

## 2018-02-19 LAB — POCT INR: INR: 2.2 (ref 2.0–3.0)

## 2018-02-19 NOTE — Patient Instructions (Addendum)
Pre visit review using our clinic review tool, if applicable. No additional management support is needed unless otherwise documented below in the visit note.  Continue to take 1 tablet daily with 1/2 tablet on Mondays and Fridays.  Re-check in 4 weeks.

## 2018-02-26 ENCOUNTER — Ambulatory Visit (INDEPENDENT_AMBULATORY_CARE_PROVIDER_SITE_OTHER): Payer: Medicare Other | Admitting: Family Medicine

## 2018-02-26 ENCOUNTER — Other Ambulatory Visit: Payer: Self-pay | Admitting: *Deleted

## 2018-02-26 ENCOUNTER — Other Ambulatory Visit: Payer: Medicare Other

## 2018-02-26 DIAGNOSIS — R319 Hematuria, unspecified: Secondary | ICD-10-CM | POA: Diagnosis not present

## 2018-02-26 DIAGNOSIS — I10 Essential (primary) hypertension: Secondary | ICD-10-CM | POA: Diagnosis not present

## 2018-02-26 LAB — POC URINALSYSI DIPSTICK (AUTOMATED)
Bilirubin, UA: NEGATIVE
Glucose, UA: NEGATIVE
Ketones, UA: NEGATIVE
Leukocytes, UA: NEGATIVE
Nitrite, UA: NEGATIVE
Protein, UA: POSITIVE — AB
Spec Grav, UA: 1.015 (ref 1.010–1.025)
Urobilinogen, UA: 0.2 E.U./dL
pH, UA: 7 (ref 5.0–8.0)

## 2018-02-26 MED ORDER — HYDROCHLOROTHIAZIDE 12.5 MG PO CAPS: 12.5000 mg | ORAL_CAPSULE | Freq: Every day | ORAL | 2 refills | Status: DC

## 2018-02-26 NOTE — Progress Notes (Signed)
Patient here today for blood pressure check.  She takes losartan 100mg  and metoprolol succinate 50mg .  Patient states  that she takes her medication at night.  Blood pressures at home she states has been running around 153/88 or 90.  Per Dr. Charlett Blake patient to start hctz 12.5mg  daily in addition to the other 2 medications and to follow up in 1-2 weeks for bp follow up with nurse.  She agreed to take and will take this medication in the mornings.    Nursing blood pressure check note reviewed. Agree with documention and plan.

## 2018-02-27 LAB — URINE CULTURE
MICRO NUMBER:: 115210
Result:: NO GROWTH
SPECIMEN QUALITY:: ADEQUATE

## 2018-02-28 DIAGNOSIS — Z87440 Personal history of urinary (tract) infections: Secondary | ICD-10-CM

## 2018-02-28 DIAGNOSIS — N390 Urinary tract infection, site not specified: Secondary | ICD-10-CM

## 2018-03-11 ENCOUNTER — Ambulatory Visit: Payer: Medicare Other | Admitting: Family Medicine

## 2018-03-11 ENCOUNTER — Encounter: Payer: Self-pay | Admitting: Family Medicine

## 2018-03-11 VITALS — BP 178/80 | HR 69 | Temp 97.7°F | Resp 18 | Wt 178.2 lb

## 2018-03-11 DIAGNOSIS — E669 Obesity, unspecified: Secondary | ICD-10-CM

## 2018-03-11 DIAGNOSIS — E1169 Type 2 diabetes mellitus with other specified complication: Secondary | ICD-10-CM | POA: Diagnosis not present

## 2018-03-11 DIAGNOSIS — I10 Essential (primary) hypertension: Secondary | ICD-10-CM | POA: Diagnosis not present

## 2018-03-11 DIAGNOSIS — D682 Hereditary deficiency of other clotting factors: Secondary | ICD-10-CM | POA: Insufficient documentation

## 2018-03-11 DIAGNOSIS — Z87440 Personal history of urinary (tract) infections: Secondary | ICD-10-CM

## 2018-03-11 DIAGNOSIS — N200 Calculus of kidney: Secondary | ICD-10-CM

## 2018-03-11 DIAGNOSIS — I712 Thoracic aortic aneurysm, without rupture, unspecified: Secondary | ICD-10-CM

## 2018-03-11 DIAGNOSIS — Z7901 Long term (current) use of anticoagulants: Secondary | ICD-10-CM

## 2018-03-11 NOTE — Assessment & Plan Note (Signed)
Is maintained on long term coumadin, will refer back to cardiology to discuss options for her upcoming renal stone procedure since they manage her coumadin

## 2018-03-11 NOTE — Assessment & Plan Note (Addendum)
Has become more resistant with her increased pain and poor sleep. She was unable to start her new BP med due to financial constraints. She agrees to try to get the HCTZ. Improved on recheck.

## 2018-03-11 NOTE — Assessment & Plan Note (Signed)
hgba1c acceptable, minimize simple carbs. Increase exercise as tolerated. Continue current meds 

## 2018-03-11 NOTE — Assessment & Plan Note (Signed)
Reviewed urine culture from [facility] on 02/28/2018 and it was negative

## 2018-03-11 NOTE — Patient Instructions (Signed)
Tylenol ES 500 mg tabs, start with 1 tab twice daily every day then if after a week the response is inadequate increase to 1 tab 3 x daily and can increase to max of 2 tabs 3 x daily. Kidney Stones  Kidney stones (urolithiasis) are solid, rock-like deposits that form inside of the organs that make urine (kidneys). A kidney stone may form in a kidney and move into the bladder, where it can cause intense pain and block the flow of urine. Kidney stones are created when high levels of certain minerals are found in the urine. They are usually passed through urination, but in some cases, medical treatment may be needed to remove them. What are the causes? Kidney stones may be caused by:  A condition in which certain glands produce too much parathyroid hormone (primary hyperparathyroidism), which causes too much calcium buildup in the blood.  Buildup of uric acid crystals in the bladder (hyperuricosuria). Uric acid is a chemical that the body produces when you eat certain foods. It usually exits the body in the urine.  Narrowing (stricture) of one or both of the tubes that drain urine from the kidneys to the bladder (ureters).  A kidney blockage that is present at birth (congenital obstruction).  Past surgery on the kidney or the ureters, such as gastric bypass surgery. What increases the risk? The following factors make you more likely to develop kidney stones:  Having had a kidney stone in the past.  Having a family history of kidney stones.  Not drinking enough water.  Eating a diet that is high in protein, salt (sodium), or sugar.  Being overweight or obese. What are the signs or symptoms? Symptoms of a kidney stone may include:  Nausea.  Vomiting.  Blood in the urine (hematuria).  Pain in the side of the abdomen, right below the ribs (flank pain). Pain usually spreads (radiates) to the groin.  Needing to urinate frequently or urgently. How is this diagnosed? This condition may  be diagnosed based on:  Your medical history.  A physical exam.  Blood tests.  Urine tests.  CT scan.  Abdominal X-ray.  A procedure to examine the inside of the bladder (cystoscopy). How is this treated? Treatment for kidney stones depends on the size, location, and makeup of the stones. Treatment may involve:  Analyzing your urine before and after you pass the stone through urination.  Being monitored at the hospital until you pass the stone through urination.  Increasing your fluid intake and decreasing the amount of calcium and protein in your diet.  A procedure to break up kidney stones in the bladder using: ? A focused beam of light (laser therapy). ? Shock waves (extracorporeal shock wave lithotripsy).  Surgery to remove kidney stones. This may be needed if you have severe pain or have stones that block your urinary tract. Follow these instructions at home: Eating and drinking  Drink enough fluid to keep your urine clear or pale yellow. This will help you to pass the kidney stone.  If directed, change your diet. This may include: ? Limiting how much sodium you eat. ? Eating more fruits and vegetables. ? Limiting how much meat, poultry, fish, and eggs you eat.  Follow instructions from your health care provider about eating or drinking restrictions. General instructions  Collect urine samples as told by your health care provider. You may need to collect a urine sample: ? 24 hours after you pass the stone. ? 8-12 weeks after passing the  kidney stone, and every 6-12 months after that.  Strain your urine every time you urinate, for as long as directed. Use the strainer that your health care provider recommends.  Do not throw out the kidney stone after passing it. Keep the stone so it can be tested by your health care provider. Testing the makeup of your kidney stone may help prevent you from getting kidney stones in the future.  Take over-the-counter and  prescription medicines only as told by your health care provider.  Keep all follow-up visits as told by your health care provider. This is important. You may need follow-up X-rays or ultrasounds to make sure that your stone has passed. How is this prevented? To prevent another kidney stone:  Drink enough fluid to keep your urine clear or pale yellow. This is the best way to prevent kidney stones.  Eat a healthy diet and follow recommendations from your health care provider about foods to avoid. You may be instructed to eat a low-protein diet. Recommendations vary depending on the type of kidney stone that you have.  Maintain a healthy weight. Contact a health care provider if:  You have pain that gets worse or does not get better with medicine. Get help right away if:  You have a fever or chills.  You develop severe pain.  You develop new abdominal pain.  You faint.  You are unable to urinate. This information is not intended to replace advice given to you by your health care provider. Make sure you discuss any questions you have with your health care provider. Document Released: 01/16/2005 Document Revised: 06/29/2016 Document Reviewed: 07/02/2015 Elsevier Interactive Patient Education  2019 [employer].

## 2018-03-11 NOTE — Progress Notes (Signed)
Subjective:    Patient ID: [name] Little, female    DOB: 01-23-1934, 85 y.o.   MRN: [id number]  No chief complaint on file.   HPI Patient is in today for follow-up.  She continues to struggle with kidney stones and pain.  She is following with Dr. Amalia Hailey at [facility] and they did do a CT renal and lab work on January 30.  Her urinalysis and culture were negative but the CT showed a persistent and larger kidney stone about 10 mm in diameter on the left and a smaller one in the lower pole of the kidney on the right.  He is interested in doing lithotripsy to try and remove the stone but is worried about her long use of Coumadin due to her factor V deficiency.  She is willing to proceed at this time but knows she needs to follow-up with cardiology.  We will arrange a referral back to her cardiologist at this time.  She did not start her HCTZ for her blood pressure due to cost but is willing to do so now.  No fevers or chills other than her back pain no other acute concerns are noted.  She is not sleeping due to her level of pain however. Denies CP/palp/SOB/HA/congestion/fevers/GI c/o. Taking meds as prescribed  Past Medical History:  Diagnosis Date  . Anxiety state, unspecified 09/22/2013  . Arthritis    lumbar stenosis, radiculopathy, OA- L hip  . BCC (basal cell carcinoma), scalp/neck 09/27/2015  . Blood clotting disorder (HCC)    V5 factor  . Blood dyscrasia    factor 5 deficiency  . Breast cancer ([place]) 2010   RIGHT breast  . Clostridium difficile infection   . Clotting disorder ([place])   . Diabetes mellitus type 2 in obese ([place]) 02/26/2014  . DVT (deep venous thrombosis) ([place])   . Factor V deficiency ([place])   . Hemorrhoids   . Hyperglycemia 02/26/2014  . Hyperlipemia   . Hypertension   . Infiltrating ductal carcinoma of breast ([place])    stage one right   . Low back pain 03/26/2014  . Macular degeneration   . Mitral valve regurgitation 09/01/2016  . Mixed hyperlipidemia 05/18/2015   . Nocturia 02/26/2014  . Paroxysmal supraventricular tachycardia ([place])   . Personal history of colonic polyps    hyperplastic  . Thoracic aortic aneurysm ([place])   . TMJ pain dysfunction syndrome     Past Surgical History:  Procedure Laterality Date  . ABDOMINAL HYSTERECTOMY     1960- ? vaginal hysterectomy, not laparoscopic   . BREAST LUMPECTOMY  2010   right Dr. [name]  . COLONOSCOPY    . EYE SURGERY     bilateral cataracts- /w IOL   . I&D EXTREMITY Right 10/26/2017   Procedure: IRRIGATION AND DEBRIDEMENT RIGHT HAND;  Surgeon: Iran Planas, MD;  Location: [facility];  Service: Orthopedics;  Laterality: Right;  . JOINT REPLACEMENT     R hip replacement   . LAPAROSCOPIC HYSTERECTOMY    . LUMBAR LAMINECTOMY/DECOMPRESSION MICRODISCECTOMY  04/11/2011   Procedure: LUMBAR LAMINECTOMY/DECOMPRESSION MICRODISCECTOMY;  Surgeon: [name], MD;  Location: [facility];  Service: Neurosurgery;  Laterality: N/A;  Lumbar Five-Sacral One Microdiscectomy  . right calf surgery     for cancer  . SPLENECTOMY    . TOTAL HIP ARTHROPLASTY  1999   right  . UPPER GASTROINTESTINAL ENDOSCOPY      Family History  Problem Relation Age of Onset  . Heart disease Mother   .  Heart disease Father   . Alzheimer's disease Sister   . Heart disease Other        uncles  . Clotting disorder Maternal Uncle   . Breast cancer Paternal Aunt   . Cancer Paternal Aunt        breast  . Colon cancer Neg Hx   . Anesthesia problems Neg Hx   . Hypotension Neg Hx   . Malignant hyperthermia Neg Hx   . Pseudochol deficiency Neg Hx     Social History   Socioeconomic History  . Marital status: Married    Spouse name: Not on file  . Number of children: 4  . Years of education: Not on file  . Highest education level: Not on file  Occupational History  . Occupation: Retired  Scientific laboratory technician  . Financial resource strain: Not on file  . Food insecurity:    Worry: Not on file    Inability: Not on file  . Transportation  needs:    Medical: Not on file    Non-medical: Not on file  Tobacco Use  . Smoking status: Never Smoker  . Smokeless tobacco: Never Used  Substance and Sexual Activity  . Alcohol use: No    Alcohol/week: 0.0 standard drinks  . Drug use: No  . Sexual activity: Never    Partners: Male  Lifestyle  . Physical activity:    Days per week: Not on file    Minutes per session: Not on file  . Stress: Not on file  Relationships  . Social connections:    Talks on phone: Not on file    Gets together: Not on file    Attends religious service: Not on file    Active member of club or organization: Not on file    Attends meetings of clubs or organizations: Not on file    Relationship status: Not on file  . Intimate partner violence:    Fear of current or ex partner: Not on file    Emotionally abused: Not on file    Physically abused: Not on file    Forced sexual activity: Not on file  Other Topics Concern  . Not on file  Social History Narrative   Married 1955   2 sons - '59, '61, 2 daughters '55, '57; 8 grandchildren; 1 great grand   I-ADLs       Outpatient Medications Prior to Visit  Medication Sig Dispense Refill  . ALPRAZolam (XANAX) 0.25 MG tablet TAKE ONE TABLET BY MOUTH TWICE DIALY AS NEEDED FOR ANXIETY (Patient taking differently: Take 0.25 mg by mouth daily as needed for anxiety. ) 20 tablet 1  . B Complex Vitamins (VITAMIN B COMPLEX PO) Take 1 tablet by mouth daily.     . Blood Glucose Monitoring Suppl (ONE TOUCH ULTRA 2) w/Device KIT See admin instructions.  0  . Blood Pressure Monitoring (BLOOD PRESSURE KIT) DEVI 1 Device by Does not apply route daily as needed. Fill per patient insurance preference Dx: Hypertension 1 Device 0  . furosemide (LASIX) 20 MG tablet Take 1 tablet (20 mg total) by mouth daily as needed for fluid or edema. 30 tablet 1  . glucose blood (IGLUCOSE TEST STRIPS) test strip Test Blood Sugars every day as needed 100 each 5  . glucose monitoring kit  (FREESTYLE) monitoring kit 1 each by Does not apply route as needed for other. 1 each 0  . hydrochlorothiazide (MICROZIDE) 12.5 MG capsule Take 1 capsule (12.5 mg total) by mouth daily.  30 capsule 2  . Lancets MISC Test Blood Sugars every day as needed 100 each 5  . losartan (COZAAR) 100 MG tablet Take 1 tablet (100 mg total) by mouth at bedtime. 90 tablet 0  . metoprolol succinate (TOPROL-XL) 50 MG 24 hr tablet Take 1 tablet (50 mg total) by mouth daily. Take with or immediately following a meal. 90 tablet 1  . Multiple Vitamins-Minerals (MULTIVITAMIN ADULTS) TABS Take 1 tablet by mouth daily.    . potassium chloride SA (K-DUR,KLOR-CON) 20 MEQ tablet Take 1 tablet (20 mEq total) by mouth daily as needed (lasix use). 30 tablet 0  . warfarin (COUMADIN) 5 MG tablet Take 1 tablet (5 mg) by mouth daily except on Mon Take 0.5 tablet (2.5 mg) (Patient taking differently: Take 2.5-5 mg by mouth See admin instructions. Take 1 tablet (5 mg) by mouth daily except on Sun Take 0.5 tablet (2.5 mg)) 90 tablet 3  . amoxicillin (AMOXIL) 500 MG capsule Take 1 capsule (500 mg total) by mouth 3 (three) times daily. 30 capsule 0  . amoxicillin-clavulanate (AUGMENTIN) 875-125 MG tablet Take 1 tablet by mouth 2 (two) times daily. 20 tablet 0  . ondansetron (ZOFRAN ODT) 4 MG disintegrating tablet Take 1 tablet (4 mg total) by mouth every 8 (eight) hours as needed for nausea or vomiting. 20 tablet 0   No facility-administered medications prior to visit.     Allergies  Allergen Reactions  . Hydralazine Other (See Comments)    Joint and chest pain  . Amlodipine Swelling    "my whole body swelled"  . Carvedilol Swelling    "my whole body swelled"  . Cefuroxime Axetil     REACTION: Ddoes not remember reaction  . Chlorthalidone     Patient reported extreme weakness.  . Statins Other (See Comments)    Weakness, pain Lipitor and Pravachol Weakness, pain Lipitor and Pravachol  . Sulfonamide Derivatives      REACTION: Rash    Review of Systems  Constitutional: Positive for malaise/fatigue. Negative for fever.  HENT: Negative for congestion.   Eyes: Negative for blurred vision.  Respiratory: Negative for shortness of breath.   Cardiovascular: Negative for chest pain, palpitations and leg swelling.  Gastrointestinal: Negative for abdominal pain, blood in stool and nausea.  Genitourinary: Positive for hematuria. Negative for dysuria and frequency.  Musculoskeletal: Positive for back pain. Negative for falls.  Skin: Negative for rash.  Neurological: Negative for dizziness, loss of consciousness and headaches.  Endo/Heme/Allergies: Negative for environmental allergies.  Psychiatric/Behavioral: Negative for depression. The patient is not nervous/anxious.        Objective:    Physical Exam Vitals signs and nursing note reviewed.  Constitutional:      General: She is not in acute distress.    Appearance: She is well-developed.  HENT:     Head: Normocephalic and atraumatic.     Nose: Nose normal.  Eyes:     General:        Right eye: No discharge.        Left eye: No discharge.  Neck:     Musculoskeletal: Normal range of motion and neck supple.  Cardiovascular:     Rate and Rhythm: Normal rate.     Heart sounds: No murmur.  Pulmonary:     Effort: Pulmonary effort is normal.     Breath sounds: Normal breath sounds.  Abdominal:     General: Bowel sounds are normal.     Palpations: Abdomen is soft.  Tenderness: There is no abdominal tenderness.  Skin:    General: Skin is warm and dry.  Neurological:     Mental Status: She is alert and oriented to person, place, and time.     BP (!) 178/80   Pulse 69   Temp 97.7 F (36.5 C) (Oral)   Resp 18   Wt 178 lb 3.2 oz (80.8 kg)   SpO2 96%   BMI 31.57 kg/m  Wt Readings from Last 3 Encounters:  03/11/18 178 lb 3.2 oz (80.8 kg)  01/29/18 171 lb 14.4 oz (78 kg)  01/29/18 171 lb 9.6 oz (77.8 kg)     Lab Results  Component  Value Date   WBC 8.5 11/22/2017   HGB 14.6 11/22/2017   HCT 43.9 11/22/2017   PLT 340.0 11/22/2017   GLUCOSE 98 11/22/2017   CHOL 219 (H) 09/17/2017   TRIG 315.0 (H) 09/17/2017   HDL 36.40 (L) 09/17/2017   LDLDIRECT 121.0 09/17/2017   LDLCALC 134 (H) 03/05/2017   ALT 12 11/22/2017   AST 17 11/22/2017   NA 141 11/22/2017   K 4.7 11/22/2017   CL 106 11/22/2017   CREATININE 0.79 11/22/2017   BUN 20 11/22/2017   CO2 27 11/22/2017   TSH 3.676 10/26/2017   INR 2.2 02/19/2018   HGBA1C 6.6 (H) 10/27/2017   MICROALBUR 1.5 10/20/2014    Lab Results  Component Value Date   TSH 3.676 10/26/2017   Lab Results  Component Value Date   WBC 8.5 11/22/2017   HGB 14.6 11/22/2017   HCT 43.9 11/22/2017   MCV 90.8 11/22/2017   PLT 340.0 11/22/2017   Lab Results  Component Value Date   NA 141 11/22/2017   K 4.7 11/22/2017   CHLORIDE 109 08/12/2013   CO2 27 11/22/2017   GLUCOSE 98 11/22/2017   BUN 20 11/22/2017   CREATININE 0.79 11/22/2017   BILITOT 0.6 11/22/2017   ALKPHOS 64 11/22/2017   AST 17 11/22/2017   ALT 12 11/22/2017   PROT 7.2 11/22/2017   ALBUMIN 4.1 11/22/2017   CALCIUM 9.7 11/22/2017   ANIONGAP 11 10/27/2017   GFR 74.07 11/22/2017   Lab Results  Component Value Date   CHOL 219 (H) 09/17/2017   Lab Results  Component Value Date   HDL 36.40 (L) 09/17/2017   Lab Results  Component Value Date   LDLCALC 134 (H) 03/05/2017   Lab Results  Component Value Date   TRIG 315.0 (H) 09/17/2017   Lab Results  Component Value Date   CHOLHDL 6 09/17/2017   Lab Results  Component Value Date   HGBA1C 6.6 (H) 10/27/2017       Assessment & Plan:   Problem List Items Addressed This Visit    Essential hypertension - Primary    Has become more resistant with her increased pain and poor sleep. She was unable to start her new BP med due to financial constraints. She agrees to try to get the HCTZ. Improved on recheck.      Relevant Orders   Ambulatory referral to  Cardiology   Aneurysm of thoracic aorta [facility])   Relevant Orders   Ambulatory referral to Cardiology   Anticoagulant long-term use   Relevant Orders   Ambulatory referral to Cardiology   Diabetes mellitus type 2 in obese ([place])    hgba1c acceptable, minimize simple carbs. Increase exercise as tolerated. Continue current meds      Relevant Orders   Ambulatory referral to Cardiology   History of  UTI    Reviewed urine culture from [facility] on 02/28/2018 and it was negative      Kidney stones   Relevant Orders   Ambulatory referral to Cardiology   Factor V deficiency ([place])    Is maintained on long term coumadin, will refer back to cardiology to discuss options for her upcoming renal stone procedure since they manage her coumadin      Relevant Orders   Ambulatory referral to Cardiology      I have discontinued Kathrine Cords. Dill's ondansetron, amoxicillin, and amoxicillin-clavulanate. I am also having her maintain her B Complex Vitamins (VITAMIN B COMPLEX PO), potassium chloride SA, glucose monitoring kit, Lancets, glucose blood, MULTIVITAMIN ADULTS, ONE TOUCH ULTRA 2, furosemide, warfarin, ALPRAZolam, losartan, Blood Pressure Kit, metoprolol succinate, and hydrochlorothiazide.  No orders of the defined types were placed in this encounter.    Penni Homans, MD

## 2018-03-12 ENCOUNTER — Ambulatory Visit: Payer: Medicare Other

## 2018-03-12 ENCOUNTER — Telehealth: Payer: Self-pay | Admitting: Cardiovascular Disease

## 2018-03-12 NOTE — Telephone Encounter (Signed)
New Message   PT is calling because she has a kidney stone and Dr Amalia Hailey wants to remove it, He wants her to stop taking Warfarin for 7 days She says she is in a lot of pain and needs to know something soon  Please call and advise

## 2018-03-12 NOTE — Telephone Encounter (Signed)
   [facility] Pre-operative Risk Assessment    Request for surgical clearance:  1. What type of surgery is being performed? POSSIBLE ESWL OR URETEROSCOPY   2. When is this surgery scheduled? TBD   3. What type of clearance is required (medical clearance vs. Pharmacy clearance to hold med vs. Both)? BOTH  4. Are there any medications that need to be held prior to surgery and how long?HOLD WARFARIN 5-7 DAYS PRIOR   5. Practice name and name of physician performing surgery? [facility]; [facility]; DR. Herbie Baltimore EVANS   6. What is your office phone number [phone]    7.   What is your office fax number [phone]  8.   Anesthesia type (None, local, MAC, general) ? MAY USE LOCAL MONITORED OR PROPOFOL, VERSED, BENADRYL, VALIUM DEPENDING ON WHICH SURGICAL APPROACH.     Julaine Hua 03/12/2018, 5:03 PM  _________________________________________________________________   (provider comments below)

## 2018-03-12 NOTE — Telephone Encounter (Signed)
I s/w pt and advised that I am going to need a Cardiac Clearance form to be faxed to our office. Pt gave me the name of the surgeon Dr. Amalia Hailey, Urologist, PH# [phone]. I called and s/w Tammy, Triage nurse at Dr. Amalia Hailey office. While speaking with Tammy we lost phone connection. I called and left a message on Tammy's VM with the information needed on the clearance form and to fax to [phone]. If any questions please call [phone].

## 2018-03-12 NOTE — Telephone Encounter (Signed)
Pt just called and I returned call to advise her that Dr. Phoebe Perch office will need to send over a surgery clearance form. Pt's vm not set up. Could not lmom.

## 2018-03-13 NOTE — Telephone Encounter (Signed)
Pt on warfarin for VTE/factor V deficiency managed by Dr. Charlett Blake. Will route to Dr. Charlett Blake for input on anticoagulation hold.

## 2018-03-14 ENCOUNTER — Telehealth: Payer: Self-pay | Admitting: Family Medicine

## 2018-03-14 NOTE — Telephone Encounter (Signed)
   Primary Cardiologist: Jenkins Rouge, MD  Chart reviewed as part of pre-operative protocol coverage. Patient was contacted 03/14/2018 in reference to pre-operative risk assessment for pending surgery as outlined below.  [name] Little was last seen on 09/06/2017 by Dr. Johnsie Cancel.  Since that day, [name] Little has done well without chest pain or shortness of breath.  Therefore, based on ACC/AHA guidelines, the patient would be at acceptable risk for the planned procedure without further cardiovascular testing.   I will route this recommendation to the requesting party via Epic fax function and remove from pre-op pool.  Please call with questions.  I informed the patient since Dr. [name] is managing her coumadin and she has Factor V Leiden deficiency, her coumadin clinic will need to decide whether to do lovenox bridge. Once she gets a specific date for her surgery, she will need to arrange a coumadin clinic within at least 7 days prior to the surgery to discuss holding procedure and possible lovenox.    Almyra Deforest, [place] 03/14/2018, 9:39 AM

## 2018-03-14 NOTE — Telephone Encounter (Signed)
Faxed to requesting office. 

## 2018-03-14 NOTE — Telephone Encounter (Signed)
Copied from [place] [id number]. Topic: Quick Communication - See Telephone Encounter >> Mar 14, 2018 10:16 AM Margot Ables wrote: CRM for notification. See Telephone encounter for: 03/14/18.  Pt called stating that she needed to see Dr. Charlett Blake today and tomorrow. Advised pt Dr. Charlett Blake not available. She states she has kidney stones and is in pain. Stated Dr. Charlett Blake needs to schedule her for surgery. Per Ruth/Princess asked pt if she is taking Tylenol as recommended at [facility] 03/11/2018. Pt said no but that she is in so much pain. Advised her to take Tylenol as was directed and to contact Urology for appt and to arrange surgery. Pt stated that Urology office needs someone to manage her coumadin and to stop it and fill the gap with Lovenox. Urology advised her that PCP needed to handle this before they can arrange surgery. Pt requesting urgent call back.

## 2018-03-14 NOTE — Telephone Encounter (Signed)
Should hold her coumadin for seven days prior to surgery and start a Lovenox bridge on day six prior to surgery hen take dose of Lovenox the day before the procedure then no dose the day of procedure then restart Lovenox and coumadin at previous dosing the day after procedure. Should have a coumadin check when we start this, the day prior to surgery and 2 days after surgery. Lovenox 40 mg [place] daily is her dose. Disp 12 doses.

## 2018-03-15 NOTE — Telephone Encounter (Signed)
Please give me me step by step for the lovenox bridge  Please advise

## 2018-03-15 NOTE — Telephone Encounter (Signed)
Lovenox 40 mg SQ daily starting 6 days prior to the surgery then hold the day before the surgery and restart the day after surgery. Hold the coumadin from 7 days prior and restart on day after surgery. With Inr checks as noted

## 2018-03-18 NOTE — Addendum Note (Signed)
Addended by: Magdalene Molly A on: 03/18/2018 12:15 PM   Modules accepted: Orders

## 2018-03-18 NOTE — Telephone Encounter (Signed)
Dr. Charlett Blake,  [place] with Lynelle Smoke, triage Nurse at Dr. Amalia Hailey office she let me know the patient will have an ureteroscopy done on March 9th. And to hold her Coumadin for 2 days.  Please advise

## 2018-03-18 NOTE — Telephone Encounter (Signed)
oK if it is only 2 days she can ust hold the coumadin she does not need the bridge

## 2018-03-19 ENCOUNTER — Ambulatory Visit: Payer: Medicare Other | Admitting: General Practice

## 2018-03-19 DIAGNOSIS — Z7901 Long term (current) use of anticoagulants: Secondary | ICD-10-CM | POA: Diagnosis not present

## 2018-03-19 DIAGNOSIS — Z86718 Personal history of other venous thrombosis and embolism: Secondary | ICD-10-CM

## 2018-03-19 LAB — POCT INR: INR: 3.3 — AB (ref 2.0–3.0)

## 2018-03-19 NOTE — Patient Instructions (Addendum)
Pre visit review using our clinic review tool, if applicable. No additional management support is needed unless otherwise documented below in the visit note.  Skip dosage today and then continue to take 1 tablet daily with 1/2 tablet on Mondays and Fridays.  Re-check in 4 weeks. Please follow surgeons instructions for holding coumadin for kidney procedure.

## 2018-03-21 NOTE — Telephone Encounter (Signed)
Patient made aware.

## 2018-03-29 ENCOUNTER — Other Ambulatory Visit: Payer: Self-pay

## 2018-03-29 MED ORDER — METOPROLOL SUCCINATE ER 100 MG PO TB24: 100.0000 mg | ORAL_TABLET | Freq: Every day | ORAL | 1 refills | Status: DC

## 2018-04-08 ENCOUNTER — Telehealth: Payer: Self-pay | Admitting: General Practice

## 2018-04-08 ENCOUNTER — Ambulatory Visit: Payer: Medicare Other | Admitting: Family Medicine

## 2018-04-08 NOTE — Telephone Encounter (Signed)
Patient called to notify coumadin clinic that surgery has been postponed to 3/20.  Patient was instructed to take 1 1/2 tablets of coumadin today and tomorrow (3/9 and 3/10) and on Wed, 3/11 to resume current dosage of coumadin.  Take last dose of coumadin on Sunday, 3/15 in preparation for surgery on 3/20.  Patient verbalized understanding.

## 2018-04-16 ENCOUNTER — Ambulatory Visit: Payer: Medicare Other

## 2018-04-19 ENCOUNTER — Telehealth: Payer: Self-pay | Admitting: General Practice

## 2018-04-19 NOTE — Telephone Encounter (Signed)
Incoming call from patient to inform me that her surgery scheduled for 3/20 has been cancelled.  Dosing instructions have been given to resume coumadin and patient will keep INR appointment for 3/31.

## 2018-04-30 ENCOUNTER — Ambulatory Visit: Payer: Medicare Other | Admitting: General Practice

## 2018-04-30 ENCOUNTER — Other Ambulatory Visit: Payer: Self-pay

## 2018-04-30 DIAGNOSIS — Z86718 Personal history of other venous thrombosis and embolism: Secondary | ICD-10-CM

## 2018-04-30 DIAGNOSIS — Z7901 Long term (current) use of anticoagulants: Secondary | ICD-10-CM | POA: Diagnosis not present

## 2018-04-30 LAB — POCT INR: INR: 2.7 (ref 2.0–3.0)

## 2018-04-30 NOTE — Patient Instructions (Addendum)
Pre visit review using our clinic review tool, if applicable. No additional management support is needed unless otherwise documented below in the visit note.  Continue to take 1 tablet daily with 1/2 tablet on Mondays and Fridays. Please see patient instructions.  4/26 - Last dose of coumadin until after procedure.  5/2 and 5/3 take 1 1/2 tablets of coumadin  5/4 and 5/5 take 1 tablet of coumadin  5/6 resume current dosage of 1 tablet daily except 1/2 tablet on Monday and Fridays.  Re-check on 5/8.

## 2018-05-24 ENCOUNTER — Other Ambulatory Visit: Payer: Self-pay | Admitting: Family Medicine

## 2018-06-04 ENCOUNTER — Ambulatory Visit (INDEPENDENT_AMBULATORY_CARE_PROVIDER_SITE_OTHER): Payer: Medicare Other | Admitting: General Practice

## 2018-06-04 DIAGNOSIS — Z7901 Long term (current) use of anticoagulants: Secondary | ICD-10-CM | POA: Diagnosis not present

## 2018-06-04 DIAGNOSIS — Z86718 Personal history of other venous thrombosis and embolism: Secondary | ICD-10-CM

## 2018-06-04 LAB — POCT INR: INR: 1.8 — AB (ref 2.0–3.0)

## 2018-06-04 NOTE — Patient Instructions (Addendum)
Pre visit review using our clinic review tool, if applicable. No additional management support is needed unless otherwise documented below in the visit note.  Please take extra 1/2 tablet today and then continue to take 1 tablet daily with 1/2 tablet on Mondays and Fridays. Re-check in 6 weeks.

## 2018-06-07 ENCOUNTER — Ambulatory Visit: Payer: Medicare Other

## 2018-06-07 ENCOUNTER — Other Ambulatory Visit: Payer: Self-pay | Admitting: Family Medicine

## 2018-06-17 ENCOUNTER — Other Ambulatory Visit: Payer: Self-pay | Admitting: Family Medicine

## 2018-06-17 LAB — HM DIABETES EYE EXAM

## 2018-07-02 ENCOUNTER — Telehealth: Payer: Self-pay | Admitting: Family Medicine

## 2018-07-02 NOTE — Telephone Encounter (Signed)
Copied from [place] [id number]. Topic: Quick Communication - Rx Refill/Question >> Jul 02, 2018 10:24 AM Rayann Heman wrote: Medication: potassium chloride SA (K-DUR,KLOR-CON) 20 MEQ tablet [[id number]]   Has the patient contacted their pharmacy? no  Preferred Pharmacy (with phone number or street name): [id number] [place], [place] [phone] (Phone) [phone] (Fax)    Agent: Please be advised that RX refills may take up to 3 business days. We ask that you follow-up with your pharmacy.

## 2018-07-02 NOTE — Telephone Encounter (Signed)
OK to refill her potassium but she needs a visit with me to arrange blood work in the near future

## 2018-07-02 NOTE — Telephone Encounter (Signed)
Dr Charlett Blake -- last potassium RX from you shows 2017?  Ok to send refill?

## 2018-07-03 MED ORDER — POTASSIUM CHLORIDE CRYS ER 20 MEQ PO TBCR: 20.0000 meq | EXTENDED_RELEASE_TABLET | Freq: Every day | ORAL | 0 refills | Status: DC | PRN

## 2018-07-03 NOTE — Addendum Note (Signed)
Addended by: Magdalene Molly A on: 07/03/2018 12:38 PM   Modules accepted: Orders

## 2018-07-03 NOTE — Telephone Encounter (Signed)
Medication sent in. Left vm for patient to call the office to schedule virtual visit

## 2018-07-05 ENCOUNTER — Encounter: Payer: Self-pay | Admitting: Family Medicine

## 2018-07-16 ENCOUNTER — Other Ambulatory Visit: Payer: Self-pay

## 2018-07-16 ENCOUNTER — Ambulatory Visit (INDEPENDENT_AMBULATORY_CARE_PROVIDER_SITE_OTHER): Payer: Medicare Other | Admitting: General Practice

## 2018-07-16 DIAGNOSIS — Z7901 Long term (current) use of anticoagulants: Secondary | ICD-10-CM | POA: Diagnosis not present

## 2018-07-16 DIAGNOSIS — Z86718 Personal history of other venous thrombosis and embolism: Secondary | ICD-10-CM

## 2018-07-16 LAB — POCT INR: INR: 3 (ref 2.0–3.0)

## 2018-07-16 NOTE — Patient Instructions (Addendum)
Pre visit review using our clinic review tool, if applicable. No additional management support is needed unless otherwise documented below in the visit note.  Please take 1/2 tablet today and then continue to take 1 tablet daily with 1/2 tablet on Mondays and Fridays. Re-check in 6 weeks.

## 2018-07-28 ENCOUNTER — Encounter: Payer: Self-pay | Admitting: Family Medicine

## 2018-07-29 ENCOUNTER — Other Ambulatory Visit: Payer: Self-pay | Admitting: Family Medicine

## 2018-07-29 DIAGNOSIS — I10 Essential (primary) hypertension: Secondary | ICD-10-CM

## 2018-07-29 MED ORDER — FUROSEMIDE 20 MG PO TABS: 20.0000 mg | ORAL_TABLET | Freq: Every day | ORAL | 0 refills | Status: DC | PRN

## 2018-07-30 ENCOUNTER — Other Ambulatory Visit: Payer: Self-pay | Admitting: Family Medicine

## 2018-07-30 DIAGNOSIS — Z1231 Encounter for screening mammogram for malignant neoplasm of breast: Secondary | ICD-10-CM

## 2018-07-31 NOTE — Telephone Encounter (Signed)
Please schedule patient to do a virtual phone visit

## 2018-08-04 ENCOUNTER — Other Ambulatory Visit: Payer: Self-pay | Admitting: Family Medicine

## 2018-08-21 ENCOUNTER — Other Ambulatory Visit: Payer: Self-pay | Admitting: Family Medicine

## 2018-08-21 DIAGNOSIS — I10 Essential (primary) hypertension: Secondary | ICD-10-CM

## 2018-08-26 ENCOUNTER — Encounter: Payer: Self-pay | Admitting: Family Medicine

## 2018-08-26 ENCOUNTER — Other Ambulatory Visit: Payer: Self-pay

## 2018-08-26 ENCOUNTER — Ambulatory Visit (INDEPENDENT_AMBULATORY_CARE_PROVIDER_SITE_OTHER): Payer: Medicare Other | Admitting: Family Medicine

## 2018-08-26 VITALS — BP 140/78 | HR 83 | Temp 97.8°F | Resp 18 | Wt 179.0 lb

## 2018-08-26 DIAGNOSIS — R300 Dysuria: Secondary | ICD-10-CM

## 2018-08-26 DIAGNOSIS — E669 Obesity, unspecified: Secondary | ICD-10-CM

## 2018-08-26 DIAGNOSIS — E1169 Type 2 diabetes mellitus with other specified complication: Secondary | ICD-10-CM

## 2018-08-26 DIAGNOSIS — N200 Calculus of kidney: Secondary | ICD-10-CM

## 2018-08-26 DIAGNOSIS — E782 Mixed hyperlipidemia: Secondary | ICD-10-CM

## 2018-08-26 DIAGNOSIS — I10 Essential (primary) hypertension: Secondary | ICD-10-CM | POA: Diagnosis not present

## 2018-08-26 DIAGNOSIS — Z87440 Personal history of urinary (tract) infections: Secondary | ICD-10-CM

## 2018-08-26 LAB — COMPREHENSIVE METABOLIC PANEL
ALT: 14 U/L (ref 0–35)
AST: 21 U/L (ref 0–37)
Albumin: 4.3 g/dL (ref 3.5–5.2)
Alkaline Phosphatase: 64 U/L (ref 39–117)
BUN: 20 mg/dL (ref 6–23)
CO2: 28 mEq/L (ref 19–32)
Calcium: 10 mg/dL (ref 8.4–10.5)
Chloride: 103 mEq/L (ref 96–112)
Creatinine, Ser: 0.99 mg/dL (ref 0.40–1.20)
GFR: 53.61 mL/min — ABNORMAL LOW (ref >60.00)
Glucose, Bld: 99 mg/dL (ref 70–99)
Potassium: 4.6 mEq/L (ref 3.5–5.1)
Sodium: 142 mEq/L (ref 135–145)
Total Bilirubin: 0.5 mg/dL (ref 0.2–1.2)
Total Protein: 7.7 g/dL (ref 6.0–8.3)

## 2018-08-26 LAB — URINALYSIS
Bilirubin Urine: NEGATIVE
Hgb urine dipstick: NEGATIVE
Ketones, ur: NEGATIVE
Leukocytes,Ua: NEGATIVE
Nitrite: NEGATIVE
Specific Gravity, Urine: 1.025 (ref 1.000–1.030)
Total Protein, Urine: NEGATIVE
Urine Glucose: NEGATIVE
Urobilinogen, UA: 0.2 (ref 0.0–1.0)
pH: 6 (ref 5.0–8.0)

## 2018-08-26 LAB — TSH: TSH: 3.93 u[IU]/mL (ref 0.35–4.50)

## 2018-08-26 LAB — HEMOGLOBIN A1C: Hgb A1c MFr Bld: 6.7 % — ABNORMAL HIGH (ref 4.6–6.5)

## 2018-08-26 NOTE — Patient Instructions (Addendum)
Kegel's sets of 10 twice daily  Pulse Oximeter at [employer], [place], [facility] etc  Second Shingrix shot  Flu shot in September, call us late August for instructions  Vitals weekly, document Urinary Incontinence  Urinary incontinence refers to a condition in which a person is unable to control where and when to pass urine. A person with this condition will urinate when he or she does not mean to (involuntarily). What are the causes? This condition may be caused by:  Medicines.  Infections.  Constipation.  Overactive bladder muscles.  Weak bladder muscles.  Weak pelvic floor muscles. These muscles provide support for the bladder, intestine, and, in women, the uterus.  Enlarged prostate in men. The prostate is a gland near the bladder. When it gets too big, it can pinch the urethra. With the urethra blocked, the bladder can weaken and lose the ability to empty properly.  Surgery.  Emotional factors, such as anxiety, stress, or post-traumatic stress disorder (PTSD).  Pelvic organ prolapse. This happens in women when organs shift out of place and into the vagina. This shift can prevent the bladder and urethra from working properly. What increases the risk? The following factors may make you more likely to develop this condition:  Older age.  Obesity and physical inactivity.  Pregnancy and childbirth.  Menopause.  Diseases that affect the nerves or spinal cord (neurological diseases).  Long-term (chronic) coughing. This can increase pressure on the bladder and pelvic floor muscles. What are the signs or symptoms? Symptoms may vary depending on the type of urinary incontinence you have. They include:  A sudden urge to urinate, but passing urine involuntarily before you can get to a bathroom (urge incontinence).  Suddenly passing urine with any activity that forces urine to pass, such as coughing, laughing, exercise, or sneezing (stress incontinence).  Needing to urinate often,  but urinating only a small amount, or constantly dribbling urine (overflow incontinence).  Urinating because you cannot get to the bathroom in time due to a physical disability, such as arthritis or injury, or communication and thinking problems, such as Alzheimer disease (functional incontinence). How is this diagnosed? This condition may be diagnosed based on:  Your medical history.  A physical exam.  Tests, such as: ? Urine tests. ? X-rays of your kidney and bladder. ? Ultrasound. ? CT scan. ? Cystoscopy. In this procedure, a health care provider inserts a tube with a light and camera (cystoscope) through the urethra and into the bladder in order to check for problems. ? Urodynamic testing. These tests assess how well the bladder, urethra, and sphincter can store and release urine. There are different types of urodynamic tests, and they vary depending on what the test is measuring. To help diagnose your condition, your health care provider may recommend that you keep a log of when you urinate and how much you urinate. How is this treated? Treatment for this condition depends on the type of incontinence that you have and its cause. Treatment may include:  Lifestyle changes, such as: ? Quitting smoking. ? Maintaining a healthy weight. ? Staying active. Try to get 150 minutes of moderate-intensity exercise every week. Ask your health care provider which activities are safe for you. ? Eating a healthy diet.  Avoid high-fat foods, like fried foods.  Avoid refined carbohydrates like white bread and white rice.  Limit how much alcohol and caffeine you drink.  Increase your fiber intake. Foods such as fresh fruits, vegetables, beans, and whole grains are healthy sources of  fiber.  Pelvic floor muscle exercises.  Bladder training, such as lengthening the amount of time between bathroom breaks, or using the bathroom at regular intervals.  Using techniques to suppress bladder urges.  This can include distraction techniques or controlled breathing exercises.  Medicines to relax the bladder muscles and prevent bladder spasms.  Medicines to help slow or prevent the growth of a man's prostate.  Botox injections. These can help relax the bladder muscles.  Using pulses of electricity to help change bladder reflexes (electrical nerve stimulation).  For women, using a medical device to prevent urine leaks. This is a small, tampon-like, disposable device that is inserted into the urethra.  Injecting collagen or carbon beads (bulking agents) into the urinary sphincter. These can help thicken tissue and close the bladder opening.  Surgery. Follow these instructions at home: Lifestyle  Limit alcohol and caffeine. These can fill your bladder quickly and irritate it.  Keep yourself clean to help prevent odors and skin damage. Ask your doctor about special skin creams and cleansers that can protect the skin from urine.  Consider wearing pads or adult diapers. Make sure to change them regularly, and always change them right after experiencing incontinence. General instructions  Take over-the-counter and prescription medicines only as told by your health care provider.  Use the bathroom about every 3-4 hours, even if you do not feel the need to urinate. Try to empty your bladder completely every time. After urinating, wait a minute. Then try to urinate again.  Make sure you are in a relaxed position while urinating.  If your incontinence is caused by nerve problems, keep a log of the medicines you take and the times you go to the bathroom.  Keep all follow-up visits as told by your health care provider. This is important. Contact a health care provider if:  You have pain that gets worse.  Your incontinence gets worse. Get help right away if:  You have a fever or chills.  You are unable to urinate.  You have redness in your groin area or down your legs. Summary   Urinary incontinence refers to a condition in which a person is unable to control where and when to pass urine.  This condition may be caused by medicines, infection, weak bladder muscles, weak pelvic floor muscles, enlargement of the prostate (in men), or surgery.  The following factors increase your risk for developing this condition: older age, obesity, pregnancy and childbirth, menopause, neurological diseases, and chronic coughing.  There are several types of urinary incontinence. They include urge incontinence, stress incontinence, overflow incontinence, and functional incontinence.  This condition is usually treated first with lifestyle and behavioral changes, such as quitting smoking, eating a healthier diet, and doing regular pelvic floor exercises. Other treatment options include medicines, bulking agents, medical devices, electrical nerve stimulation, or surgery. This information is not intended to replace advice given to you by your health care provider. Make sure you discuss any questions you have with your health care provider. Document Released: 02/24/2004 Document Revised: 01/26/2017 Document Reviewed: 04/27/2016 Elsevier Patient Education  2020 [employer].

## 2018-08-27 ENCOUNTER — Ambulatory Visit (INDEPENDENT_AMBULATORY_CARE_PROVIDER_SITE_OTHER): Payer: Medicare Other | Admitting: General Practice

## 2018-08-27 DIAGNOSIS — Z7901 Long term (current) use of anticoagulants: Secondary | ICD-10-CM

## 2018-08-27 DIAGNOSIS — Z86718 Personal history of other venous thrombosis and embolism: Secondary | ICD-10-CM

## 2018-08-27 DIAGNOSIS — Z832 Family history of diseases of the blood and blood-forming organs and certain disorders involving the immune mechanism: Secondary | ICD-10-CM

## 2018-08-27 LAB — POCT INR: INR: 2.9 (ref 2.0–3.0)

## 2018-08-27 NOTE — Patient Instructions (Addendum)
Pre visit review using our clinic review tool, if applicable. No additional management support is needed unless otherwise documented below in the visit note.  Change dosage to 1 tablet daily with 1/2 tablet on Mondays/Wednesdays and Fridays. Re-check 4 to 5 weeks.

## 2018-08-28 ENCOUNTER — Encounter: Payer: Self-pay | Admitting: Family Medicine

## 2018-08-28 LAB — URINE CULTURE
MICRO NUMBER:: 707048
SPECIMEN QUALITY:: ADEQUATE

## 2018-08-28 MED ORDER — AMOXICILLIN 500 MG PO CAPS: 500.0000 mg | ORAL_CAPSULE | Freq: Three times a day (TID) | ORAL | 0 refills | Status: DC

## 2018-08-28 NOTE — Assessment & Plan Note (Signed)
Has an Ecoli UTI, started on antibiotics.

## 2018-08-28 NOTE — Progress Notes (Signed)
Subjective:    Patient ID: [name] Little, female    DOB: 08/06/1933, 85 y.o.   MRN: [id number]  No chief complaint on file.   HPI Patient is in today for follow up on chronic medicl concerns including hyperlipidemia, urinary tract infections, hypertension and more. No recent febrile illness or hospitalization but she does think she has  a urinary tract infection again with urinary frequency. Her greatest complaint is persistent fatigue. Denies CP/palp/SOB/HA/congestion/fevers/GI c/o. Taking meds as prescribed  Past Medical History:  Diagnosis Date  . Anxiety state, unspecified 09/22/2013  . Arthritis    lumbar stenosis, radiculopathy, OA- L hip  . BCC (basal cell carcinoma), scalp/neck 09/27/2015  . Blood clotting disorder (HCC)    V5 factor  . Blood dyscrasia    factor 5 deficiency  . Breast cancer ([place]) 2010   RIGHT breast  . Clostridium difficile infection   . Clotting disorder ([place])   . Diabetes mellitus type 2 in obese ([place]) 02/26/2014  . DVT (deep venous thrombosis) ([place])   . Factor V deficiency ([place])   . Hemorrhoids   . Hyperglycemia 02/26/2014  . Hyperlipemia   . Hypertension   . Infiltrating ductal carcinoma of breast ([place])    stage one right   . Low back pain 03/26/2014  . Macular degeneration   . Mitral valve regurgitation 09/01/2016  . Mixed hyperlipidemia 05/18/2015  . Nocturia 02/26/2014  . Paroxysmal supraventricular tachycardia ([place])   . Personal history of colonic polyps    hyperplastic  . Thoracic aortic aneurysm ([place])   . TMJ pain dysfunction syndrome     Past Surgical History:  Procedure Laterality Date  . ABDOMINAL HYSTERECTOMY     1960- ? vaginal hysterectomy, not laparoscopic   . BREAST LUMPECTOMY  2010   right Dr. [name]  . COLONOSCOPY    . EYE SURGERY     bilateral cataracts- /w IOL   . I&D EXTREMITY Right 10/26/2017   Procedure: IRRIGATION AND DEBRIDEMENT RIGHT HAND;  Surgeon: Iran Planas, MD;  Location: [facility];  Service: Orthopedics;   Laterality: Right;  . JOINT REPLACEMENT     R hip replacement   . LAPAROSCOPIC HYSTERECTOMY    . LUMBAR LAMINECTOMY/DECOMPRESSION MICRODISCECTOMY  04/11/2011   Procedure: LUMBAR LAMINECTOMY/DECOMPRESSION MICRODISCECTOMY;  Surgeon: [name], MD;  Location: [facility];  Service: Neurosurgery;  Laterality: N/A;  Lumbar Five-Sacral One Microdiscectomy  . right calf surgery     for cancer  . SPLENECTOMY    . TOTAL HIP ARTHROPLASTY  1999   right  . UPPER GASTROINTESTINAL ENDOSCOPY      Family History  Problem Relation Age of Onset  . Heart disease Mother   . Heart disease Father   . Alzheimer's disease Sister   . Heart disease Other        uncles  . Clotting disorder Maternal Uncle   . Breast cancer Paternal Aunt   . Cancer Paternal Aunt        breast  . Colon cancer Neg Hx   . Anesthesia problems Neg Hx   . Hypotension Neg Hx   . Malignant hyperthermia Neg Hx   . Pseudochol deficiency Neg Hx     Social History   Socioeconomic History  . Marital status: Married    Spouse name: Not on file  . Number of children: 4  . Years of education: Not on file  . Highest education level: Not on file  Occupational History  . Occupation: Retired  Scientific laboratory technician  .  Financial resource strain: Not on file  . Food insecurity    Worry: Not on file    Inability: Not on file  . Transportation needs    Medical: Not on file    Non-medical: Not on file  Tobacco Use  . Smoking status: Never Smoker  . Smokeless tobacco: Never Used  Substance and Sexual Activity  . Alcohol use: No    Alcohol/week: 0.0 standard drinks  . Drug use: No  . Sexual activity: Never    Partners: Male  Lifestyle  . Physical activity    Days per week: Not on file    Minutes per session: Not on file  . Stress: Not on file  Relationships  . Social Herbalist on phone: Not on file    Gets together: Not on file    Attends religious service: Not on file    Active member of club or organization: Not  on file    Attends meetings of clubs or organizations: Not on file    Relationship status: Not on file  . Intimate partner violence    Fear of current or ex partner: Not on file    Emotionally abused: Not on file    Physically abused: Not on file    Forced sexual activity: Not on file  Other Topics Concern  . Not on file  Social History Narrative   Married 1955   2 sons - '59, '61, 2 daughters '55, '57; 8 grandchildren; 1 great grand   I-ADLs       Outpatient Medications Prior to Visit  Medication Sig Dispense Refill  . ALPRAZolam (XANAX) 0.25 MG tablet TAKE ONE TABLET BY MOUTH TWICE DIALY AS NEEDED FOR ANXIETY (Patient taking differently: Take 0.25 mg by mouth daily as needed for anxiety. ) 20 tablet 1  . B Complex Vitamins (VITAMIN B COMPLEX PO) Take 1 tablet by mouth daily.     . Blood Glucose Monitoring Suppl (ONE TOUCH ULTRA 2) w/Device KIT See admin instructions.  0  . Blood Pressure Monitoring (BLOOD PRESSURE KIT) DEVI 1 Device by Does not apply route daily as needed. Fill per patient insurance preference Dx: Hypertension 1 Device 0  . furosemide (LASIX) 20 MG tablet TAKE 1 TABLET (20 MG TOTAL) BY MOUTH DAILY AS NEEDED FOR FLUID OR EDEMA. 30 tablet 0  . glucose blood (IGLUCOSE TEST STRIPS) test strip Test Blood Sugars every day as needed 100 each 5  . glucose monitoring kit (FREESTYLE) monitoring kit 1 each by Does not apply route as needed for other. 1 each 0  . hydrochlorothiazide (MICROZIDE) 12.5 MG capsule TAKE 1 CAPSULE BY MOUTH EVERY DAY 30 capsule 2  . KLOR-CON M20 20 MEQ tablet TAKE 1 TABLET (20 MEQ TOTAL) BY MOUTH DAILY AS NEEDED (LASIX USE). 30 tablet 0  . Lancets MISC Test Blood Sugars every day as needed 100 each 5  . losartan (COZAAR) 100 MG tablet TAKE 1 TABLET BY MOUTH EVERY DAY 90 tablet 0  . metoprolol succinate (TOPROL-XL) 100 MG 24 hr tablet Take 1 tablet (100 mg total) by mouth daily. Take with or immediately following a meal. 90 tablet 1  . Multiple  Vitamins-Minerals (MULTIVITAMIN ADULTS) TABS Take 1 tablet by mouth daily.    Marland Kitchen warfarin (COUMADIN) 5 MG tablet Take 0.5-1 tablets (2.5-5 mg total) by mouth See admin instructions. Take 1 tablet (5 mg) by mouth daily except on Sun Take 0.5 tablet (2.5 mg) 90 tablet 1   No facility-administered  medications prior to visit.     Allergies  Allergen Reactions  . Hydralazine Other (See Comments)    Joint and chest pain  . Amlodipine Swelling    "my whole body swelled"  . Carvedilol Swelling    "my whole body swelled"  . Cefuroxime Axetil     REACTION: Ddoes not remember reaction  . Chlorthalidone     Patient reported extreme weakness.  . Statins Other (See Comments)    Weakness, pain Lipitor and Pravachol Weakness, pain Lipitor and Pravachol  . Sulfonamide Derivatives     REACTION: Rash    Review of Systems  Constitutional: Positive for malaise/fatigue. Negative for fever.  HENT: Negative for congestion.   Eyes: Negative for blurred vision.  Respiratory: Negative for shortness of breath.   Cardiovascular: Negative for chest pain, palpitations and leg swelling.  Gastrointestinal: Negative for abdominal pain, blood in stool and nausea.  Genitourinary: Positive for frequency. Negative for dysuria.  Musculoskeletal: Negative for falls.  Skin: Negative for rash.  Neurological: Negative for dizziness, loss of consciousness and headaches.  Endo/Heme/Allergies: Negative for environmental allergies.  Psychiatric/Behavioral: Negative for depression. The patient is not nervous/anxious.        Objective:    Physical Exam Vitals signs and nursing note reviewed.  Constitutional:      General: She is not in acute distress.    Appearance: She is well-developed.  HENT:     Head: Normocephalic and atraumatic.     Nose: Nose normal.  Eyes:     General:        Right eye: No discharge.        Left eye: No discharge.  Neck:     Musculoskeletal: Normal range of motion and neck supple.   Cardiovascular:     Rate and Rhythm: Normal rate.     Heart sounds: No murmur.  Pulmonary:     Effort: Pulmonary effort is normal.     Breath sounds: Normal breath sounds.  Abdominal:     General: Bowel sounds are normal.     Palpations: Abdomen is soft.     Tenderness: There is no abdominal tenderness.  Skin:    General: Skin is warm and dry.  Neurological:     Mental Status: She is alert and oriented to person, place, and time.     BP 140/78 (BP Location: Left Arm, Patient Position: Sitting, Cuff Size: Normal)   Pulse 83   Temp 97.8 F (36.6 C) (Oral)   Resp 18   Wt 179 lb (81.2 kg)   SpO2 93%   BMI 31.71 kg/m  Wt Readings from Last 3 Encounters:  08/26/18 179 lb (81.2 kg)  03/11/18 178 lb 3.2 oz (80.8 kg)  01/29/18 171 lb 14.4 oz (78 kg)    Diabetic Foot Exam - Simple   No data filed     Lab Results  Component Value Date   WBC 8.5 11/22/2017   HGB 14.6 11/22/2017   HCT 43.9 11/22/2017   PLT 340.0 11/22/2017   GLUCOSE 99 08/26/2018   CHOL 219 (H) 09/17/2017   TRIG 315.0 (H) 09/17/2017   HDL 36.40 (L) 09/17/2017   LDLDIRECT 121.0 09/17/2017   LDLCALC 134 (H) 03/05/2017   ALT 14 08/26/2018   AST 21 08/26/2018   NA 142 08/26/2018   K 4.6 08/26/2018   CL 103 08/26/2018   CREATININE 0.99 08/26/2018   BUN 20 08/26/2018   CO2 28 08/26/2018   TSH 3.93 08/26/2018   INR 2.9  08/27/2018   HGBA1C 6.7 (H) 08/26/2018   MICROALBUR 1.5 10/20/2014    Lab Results  Component Value Date   TSH 3.93 08/26/2018   Lab Results  Component Value Date   WBC 8.5 11/22/2017   HGB 14.6 11/22/2017   HCT 43.9 11/22/2017   MCV 90.8 11/22/2017   PLT 340.0 11/22/2017   Lab Results  Component Value Date   NA 142 08/26/2018   K 4.6 08/26/2018   CHLORIDE 109 08/12/2013   CO2 28 08/26/2018   GLUCOSE 99 08/26/2018   BUN 20 08/26/2018   CREATININE 0.99 08/26/2018   BILITOT 0.5 08/26/2018   ALKPHOS 64 08/26/2018   AST 21 08/26/2018   ALT 14 08/26/2018   PROT 7.7  08/26/2018   ALBUMIN 4.3 08/26/2018   CALCIUM 10.0 08/26/2018   ANIONGAP 11 10/27/2017   GFR 53.61 (L) 08/26/2018   Lab Results  Component Value Date   CHOL 219 (H) 09/17/2017   Lab Results  Component Value Date   HDL 36.40 (L) 09/17/2017   Lab Results  Component Value Date   LDLCALC 134 (H) 03/05/2017   Lab Results  Component Value Date   TRIG 315.0 (H) 09/17/2017   Lab Results  Component Value Date   CHOLHDL 6 09/17/2017   Lab Results  Component Value Date   HGBA1C 6.7 (H) 08/26/2018       Assessment & Plan:   Problem List Items Addressed This Visit    Hyperlipidemia    Encouraged heart healthy diet, increase exercise, avoid trans fats, consider a krill oil cap daily      Essential hypertension    Encouraged to check vitals weekly  no changes to meds. Encouraged heart healthy diet such as the DASH diet and exercise as tolerated.       Relevant Orders   Comprehensive metabolic panel (Completed)   TSH (Completed)   CBC with Differential/Platelet   Diabetes mellitus type 2 in obese (HCC)    hgba1c acceptable, minimize simple carbs. Increase exercise as tolerated. Continue current meds      Relevant Orders   Hemoglobin A1c (Completed)   History of UTI    Has an Ecoli UTI, started on antibiotics.      Relevant Orders   Urine Culture (Completed)   Urinalysis (Completed)   Kidney stones   Relevant Orders   Urine Culture (Completed)   Urinalysis (Completed)    Other Visit Diagnoses    Dysuria    -  Primary   Relevant Orders   Urine Culture (Completed)   Urinalysis (Completed)      I am having Kathrine Cords. [place] maintain her B Complex Vitamins (VITAMIN B COMPLEX PO), glucose monitoring kit, Lancets, glucose blood, Multivitamin Adults, ONE TOUCH ULTRA 2, ALPRAZolam, Blood Pressure Kit, metoprolol succinate, warfarin, hydrochlorothiazide, losartan, Klor-Con M20, and furosemide.  No orders of the defined types were placed in this encounter.    Penni Homans, MD

## 2018-08-28 NOTE — Assessment & Plan Note (Signed)
Encouraged to check vitals weekly no changes to meds. Encouraged heart healthy diet such as the DASH diet and exercise as tolerated.  

## 2018-08-28 NOTE — Progress Notes (Signed)
error 

## 2018-08-28 NOTE — Assessment & Plan Note (Signed)
hgba1c acceptable, minimize simple carbs. Increase exercise as tolerated. Continue current meds 

## 2018-08-28 NOTE — Assessment & Plan Note (Signed)
Encouraged heart healthy diet, increase exercise, avoid trans fats, consider a krill oil cap daily 

## 2018-09-07 ENCOUNTER — Other Ambulatory Visit: Payer: Self-pay | Admitting: Family Medicine

## 2018-09-08 ENCOUNTER — Other Ambulatory Visit: Payer: Self-pay | Admitting: Family Medicine

## 2018-09-11 ENCOUNTER — Other Ambulatory Visit (INDEPENDENT_AMBULATORY_CARE_PROVIDER_SITE_OTHER): Payer: Medicare Other

## 2018-09-11 ENCOUNTER — Other Ambulatory Visit: Payer: Self-pay

## 2018-09-11 DIAGNOSIS — I10 Essential (primary) hypertension: Secondary | ICD-10-CM | POA: Diagnosis not present

## 2018-09-11 LAB — CBC WITH DIFFERENTIAL/PLATELET
Basophils Absolute: 0.1 10*3/uL (ref 0.0–0.1)
Basophils Relative: 0.7 % (ref 0.0–3.0)
Eosinophils Absolute: 0.2 10*3/uL (ref 0.0–0.7)
Eosinophils Relative: 2.1 % (ref 0.0–5.0)
HCT: 43.6 % (ref 36.0–46.0)
Hemoglobin: 14.5 g/dL (ref 12.0–15.0)
Lymphocytes Relative: 54.1 % — ABNORMAL HIGH (ref 12.0–46.0)
Lymphs Abs: 5.6 10*3/uL — ABNORMAL HIGH (ref 0.7–4.0)
MCHC: 33.2 g/dL (ref 30.0–36.0)
MCV: 92.8 fl (ref 78.0–100.0)
Monocytes Absolute: 1.3 10*3/uL — ABNORMAL HIGH (ref 0.1–1.0)
Monocytes Relative: 12.4 % — ABNORMAL HIGH (ref 3.0–12.0)
Neutro Abs: 3.2 10*3/uL (ref 1.4–7.7)
Neutrophils Relative %: 30.7 % — ABNORMAL LOW (ref 43.0–77.0)
Platelets: 333 10*3/uL (ref 150.0–400.0)
RBC: 4.7 Mil/uL (ref 3.87–5.11)
RDW: 14.5 % (ref 11.5–15.5)
WBC: 10.4 10*3/uL (ref 4.0–10.5)

## 2018-09-13 ENCOUNTER — Ambulatory Visit
Admission: RE | Admit: 2018-09-13 | Discharge: 2018-09-13 | Disposition: A | Discharge status: Home / Self Care | Payer: Medicare Other | Source: Ambulatory Visit | Attending: Family Medicine | Admitting: Family Medicine

## 2018-09-13 ENCOUNTER — Other Ambulatory Visit: Payer: Self-pay

## 2018-09-13 DIAGNOSIS — Z1231 Encounter for screening mammogram for malignant neoplasm of breast: Secondary | ICD-10-CM

## 2018-09-25 ENCOUNTER — Other Ambulatory Visit: Payer: Self-pay | Admitting: Family Medicine

## 2018-09-25 DIAGNOSIS — I10 Essential (primary) hypertension: Secondary | ICD-10-CM

## 2018-10-01 ENCOUNTER — Other Ambulatory Visit: Payer: Self-pay

## 2018-10-01 ENCOUNTER — Ambulatory Visit (INDEPENDENT_AMBULATORY_CARE_PROVIDER_SITE_OTHER): Payer: Medicare Other | Admitting: General Practice

## 2018-10-01 DIAGNOSIS — Z86718 Personal history of other venous thrombosis and embolism: Secondary | ICD-10-CM

## 2018-10-01 DIAGNOSIS — Z7901 Long term (current) use of anticoagulants: Secondary | ICD-10-CM

## 2018-10-01 LAB — POCT INR: INR: 2.7 (ref 2.0–3.0)

## 2018-10-01 NOTE — Patient Instructions (Addendum)
Pre visit review using our clinic review tool, if applicable. No additional management support is needed unless otherwise documented below in the visit note.  Change dosage to 1 tablet daily with 1/2 tablet on Mondays/Wednesdays and Fridays. Re-check 6 to 5 weeks.

## 2018-10-18 NOTE — Progress Notes (Signed)
Cardiology Office Note   Date:  10/30/2018   ID:  [name] Little, DOB 02-12-1933, MRN [id number]  PCP:  Mosie Lukes, MD  Cardiologist:   Jenkins Rouge, MD   No chief complaint on file.     History of Present Illness:  85 y.o. first seen August 2018 for murmur and mitral valve disease.   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 CRF;s HTN, elevated lipids and DM.  Intolerant to statins with lipitor and pravachol causing weakness. Echo done 08/24/16 for edema reviewed She has no history of chest pain palpitations PND/Orhtopnea rheumatic fever or SBE  Study Conclusions  - Left ventricle: The cavity size was normal. There was mild   concentric hypertrophy. Systolic function was normal. The   estimated ejection fraction was in the range of 55% to 60%. Wall   motion was normal; there were no regional wall motion   abnormalities. - Aortic root: The aortic root was mildly dilated. - Mitral valve: There was mild to moderate regurgitation. - Left atrium: The atrium was mildly dilated.   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 Rx INR Rx at 2.5 Resolved  August 2019 BP been running high Takes Cozaar in am and lasix latter in day   No issues with heart. Her first cousin is Copywriter, advertising who I care for as well     Past Medical History:  Diagnosis Date  . Anxiety state, unspecified 09/22/2013  . Arthritis    lumbar stenosis, radiculopathy, OA- L hip  . BCC (basal cell carcinoma), scalp/neck 09/27/2015  . Blood clotting disorder (HCC)    V5 factor  . Blood dyscrasia    factor 5 deficiency  . Breast cancer ([place]) 2010   RIGHT breast  .  Clostridium difficile infection   . Clotting disorder ([place])   . Diabetes mellitus type 2 in obese ([place]) 02/26/2014  . DVT (deep venous thrombosis) ([place])   . Factor V deficiency ([place])   . Hemorrhoids   . Hyperglycemia 02/26/2014  . Hyperlipemia   . Hypertension   . Infiltrating ductal carcinoma of breast ([place])    stage one right   . Low back pain 03/26/2014  . Macular degeneration   . Mitral valve regurgitation 09/01/2016  . Mixed hyperlipidemia 05/18/2015  . Nocturia 02/26/2014  . Paroxysmal supraventricular tachycardia ([place])   . Personal history of colonic polyps    hyperplastic  . Thoracic aortic aneurysm ([place])   . TMJ pain dysfunction syndrome     Past Surgical History:  Procedure Laterality Date  . ABDOMINAL HYSTERECTOMY     1960- ? vaginal hysterectomy, not laparoscopic   . BREAST LUMPECTOMY  2010   right Dr. [name]  . COLONOSCOPY    . EYE SURGERY     bilateral cataracts- /w IOL   . I&D EXTREMITY Right 10/26/2017   Procedure: IRRIGATION AND DEBRIDEMENT RIGHT HAND;  Surgeon: Iran Planas, MD;  Location: [facility];  Service: Orthopedics;  Laterality: Right;  . JOINT REPLACEMENT     R hip replacement   . LAPAROSCOPIC HYSTERECTOMY    . LUMBAR LAMINECTOMY/DECOMPRESSION MICRODISCECTOMY  04/11/2011   Procedure: LUMBAR LAMINECTOMY/DECOMPRESSION MICRODISCECTOMY;  Surgeon: [name], MD;  Location: [facility];  Service: Neurosurgery;  Laterality: N/A;  Lumbar Five-Sacral One Microdiscectomy  . right calf surgery     for cancer  . SPLENECTOMY    . TOTAL HIP ARTHROPLASTY  1999   right  . UPPER GASTROINTESTINAL ENDOSCOPY       Current Outpatient Medications  Medication Sig Dispense Refill  . ALPRAZolam (XANAX) 0.25 MG tablet TAKE ONE TABLET BY MOUTH TWICE DIALY AS NEEDED FOR ANXIETY 20 tablet 1  . B Complex Vitamins (VITAMIN B COMPLEX PO) Take 1 tablet by mouth daily.     . Blood Glucose Monitoring Suppl (ONE TOUCH ULTRA 2) w/Device KIT See admin instructions.  0  . Blood  Pressure Monitoring (BLOOD PRESSURE KIT) DEVI 1 Device by Does not apply route daily as needed. Fill per patient insurance preference Dx: Hypertension 1 Device 0  . furosemide (LASIX) 20 MG tablet TAKE 1 TABLET (20 MG TOTAL) BY MOUTH DAILY AS NEEDED FOR FLUID OR EDEMA. 30 tablet 0  . glucose blood (IGLUCOSE TEST STRIPS) test strip Test Blood Sugars every day as needed 100 each 5  . glucose monitoring kit (FREESTYLE) monitoring kit 1 each by Does not apply route as needed for other. 1 each 0  . hydrochlorothiazide (MICROZIDE) 12.5 MG capsule TAKE 1 CAPSULE BY MOUTH EVERY DAY 30 capsule 2  . KLOR-CON M20 20 MEQ tablet TAKE 1 TABLET (20 MEQ TOTAL) BY MOUTH DAILY AS NEEDED (LASIX USE). 30 tablet 0  . Lancets MISC Test Blood Sugars every day as needed 100 each 5  . losartan (COZAAR) 100 MG tablet TAKE 1 TABLET BY MOUTH EVERY DAY 90 tablet 0  . metoprolol succinate (TOPROL-XL) 100 MG 24 hr tablet Take 1 tablet (100 mg total) by mouth daily. Take with or immediately following a meal. 90 tablet 1  . Multiple Vitamins-Minerals (MULTIVITAMIN ADULTS) TABS Take 1 tablet by mouth daily.    Marland Kitchen warfarin (COUMADIN) 5 MG tablet Take 0.5-1 tablets (2.5-5 mg total) by mouth See admin instructions. Take 1 tablet (5 mg) by mouth daily except on Sun Take 0.5 tablet (2.5 mg) 90 tablet 1   No current facility-administered medications for this visit.     Allergies:   Hydralazine, Amlodipine, Carvedilol, Cefuroxime axetil, Chlorthalidone, Statins, and Sulfonamide derivatives    Social History:  The patient  reports that she has never smoked. She has never used smokeless tobacco. She reports that she does not drink alcohol or use drugs.   Family History:  The patient's family history includes Alzheimer's disease in her sister; Breast cancer in her paternal aunt; Cancer in her paternal aunt; Clotting disorder in her maternal uncle; Heart disease in her father, mother, and another family member.    ROS:  Please see the  history of present illness.   Otherwise, review of systems are positive for none.   All other systems are reviewed and negative.    PHYSICAL EXAM: VS:  BP 140/84   Pulse 83   Ht '5\' 3"'  (1.6 m)   Wt 177 lb (80.3 kg)   SpO2 94%   BMI 31.35 kg/m  , BMI Body mass index is 31.35 kg/m. Affect appropriate Healthy:  appears stated age 59: normal Neck supple with no adenopathy JVP normal no bruits no thyromegaly Lungs clear with no wheezing and good diaphragmatic motion Heart:  S1/S2 soft apical MR murmur, no rub, gallop or click PMI normal Abdomen: benighn, BS  positve, no tenderness, no AAA no bruit.  No HSM or HJR Distal pulses intact with no bruits Plus one bilateral ankle  edema Neuro non-focal Skin warm and dry No muscular weakness    EKG:  09/06/17 SR rate 71 LAD otherwise normal    Recent Labs: 08/26/2018: ALT 14; BUN 20; Creatinine, Ser 0.99; Potassium 4.6; Sodium 142; TSH 3.93 09/11/2018: Hemoglobin 14.5; Platelets 333.0    Lipid Panel    Component Value Date/Time   CHOL 219 (H) 09/17/2017 0807   TRIG 315.0 (H) 09/17/2017 0807   HDL 36.40 (L) 09/17/2017 0807   CHOLHDL 6 09/17/2017 0807   VLDL 63.0 (H) 09/17/2017 0807   LDLCALC 134 (H) 03/05/2017 0943   LDLDIRECT 121.0 09/17/2017 0807      Wt Readings from Last 3 Encounters:  10/30/18 177 lb (80.3 kg)  08/26/18 179 lb (81.2 kg)  03/11/18 178 lb 3.2 oz (80.8 kg)      Other studies Reviewed: Additional studies/ records that were reviewed today include: Notes Dr [name] Echo done July 2018 ECG's .    ASSESSMENT AND PLAN:  1. MR- mild to moderate not clinically significant no need for SBE f/u in a year 2. HTN improved continue current meds  3. DM:  Discussed low carb diet.  Target hemoglobin A1c is 6.5 or less.  Continue current medications. 4. Cholesterol continue welchol  intolerant to statins  5. Hypercoagulability: on coumadin followed by primary  6. Anxiety:  Seems stable  7. Edema: dependant from  venous disease cut out salt in chicken tenders PRN Lasix    Current medicines are reviewed at length with the patient today.  The patient does not have concerns regarding medicines.  The following changes have been made:  no change  Labs/ tests ordered today include: None  Orders Placed This Encounter  Procedures  . EKG 12-Lead     Disposition:   FU with me in a year      Signed, Jenkins Rouge, MD  10/30/2018 10:37 AM    [facility] [place], [place], [place]  [address] Phone: [phone]; Fax: [phone]

## 2018-10-24 ENCOUNTER — Other Ambulatory Visit: Payer: Self-pay

## 2018-10-24 ENCOUNTER — Ambulatory Visit (INDEPENDENT_AMBULATORY_CARE_PROVIDER_SITE_OTHER): Payer: Medicare Other

## 2018-10-24 DIAGNOSIS — Z23 Encounter for immunization: Secondary | ICD-10-CM

## 2018-10-24 NOTE — Progress Notes (Signed)
Here for flu shot

## 2018-10-30 ENCOUNTER — Encounter: Payer: Self-pay | Admitting: Cardiovascular Disease

## 2018-10-30 ENCOUNTER — Ambulatory Visit (INDEPENDENT_AMBULATORY_CARE_PROVIDER_SITE_OTHER): Payer: Medicare Other | Admitting: Cardiovascular Disease

## 2018-10-30 ENCOUNTER — Other Ambulatory Visit: Payer: Self-pay

## 2018-10-30 VITALS — BP 140/84 | HR 83 | Ht 63.0 in | Wt 177.0 lb

## 2018-10-30 DIAGNOSIS — I340 Nonrheumatic mitral (valve) insufficiency: Secondary | ICD-10-CM | POA: Diagnosis not present

## 2018-10-30 DIAGNOSIS — R011 Cardiac murmur, unspecified: Secondary | ICD-10-CM

## 2018-10-30 IMAGING — CT CT RENAL STONE PROTOCOL
2 of 4 series · 16 of 46 positions shown, 18 images · non-contrast
Comparison: Overlapping portions of CT chest from 11/29/2016

CLINICAL DATA: Urinary tract infection resistant to antibiotic
therapy. Gross hematuria.

EXAM:
CT ABDOMEN AND PELVIS WITHOUT CONTRAST
TECHNIQUE: Multidetector CT imaging of the abdomen and pelvis was performed
following the standard protocol without IV contrast.

[Series 2: axial st · axial · 0.78mm/px · z∈[-481,-66]mm · 13 of 91 slices shown, 15 images]
[im 4/91  soft-tissue]
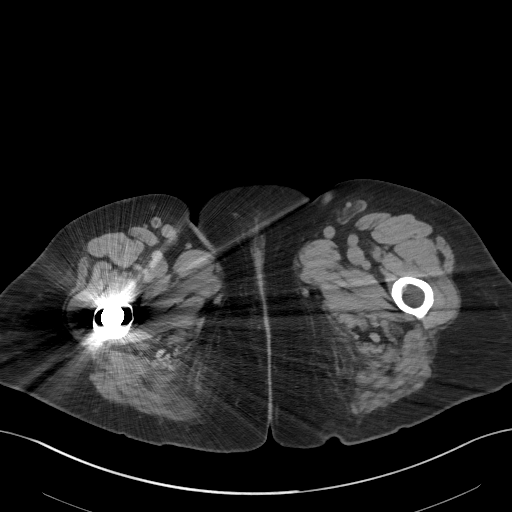
[im 4/91  bone]
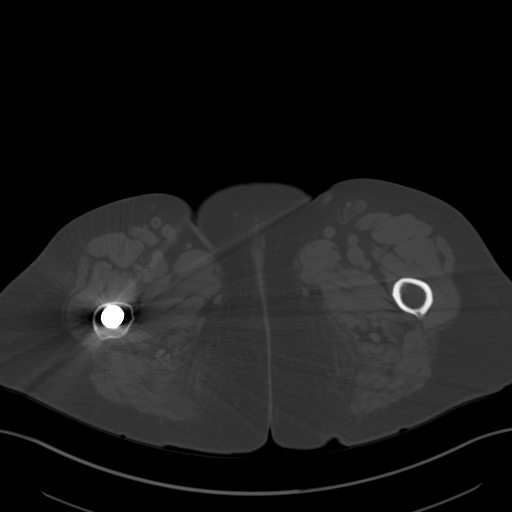
[im 11/91  soft-tissue]
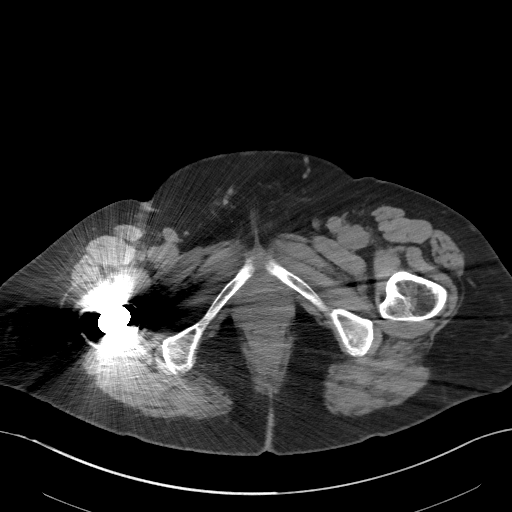
[im 19/91  soft-tissue]
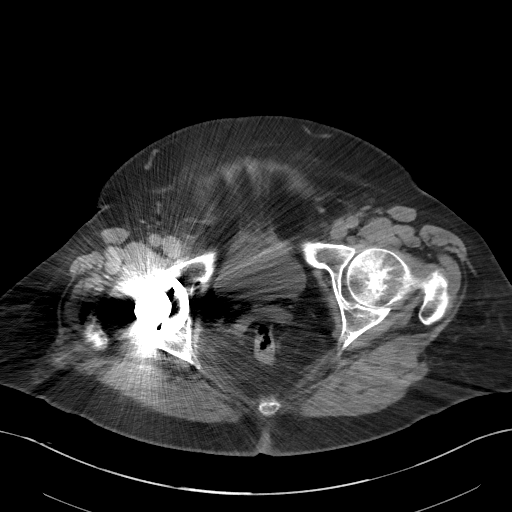
[im 26/91  soft-tissue]
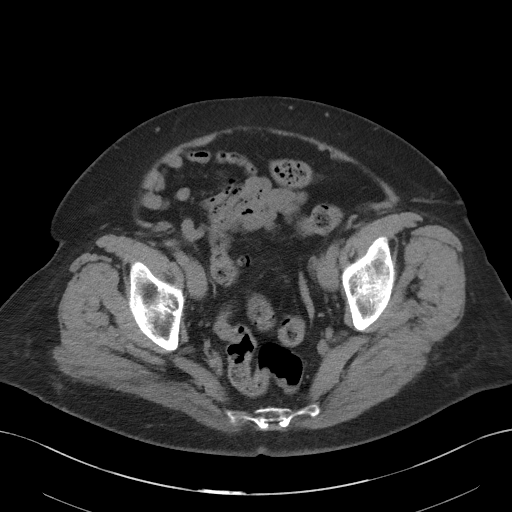
[im 33/91  soft-tissue]
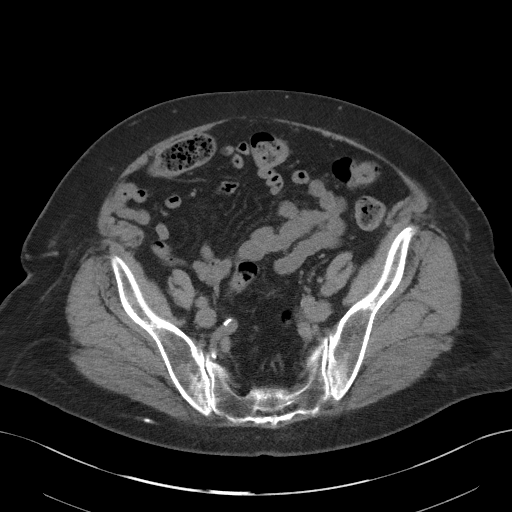
[im 40/91  soft-tissue]
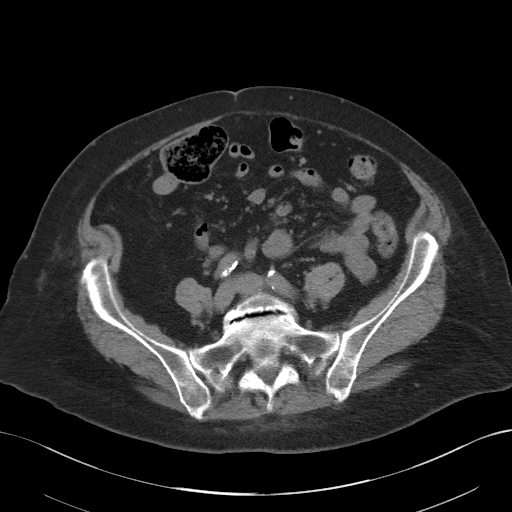
[im 47/91  soft-tissue]
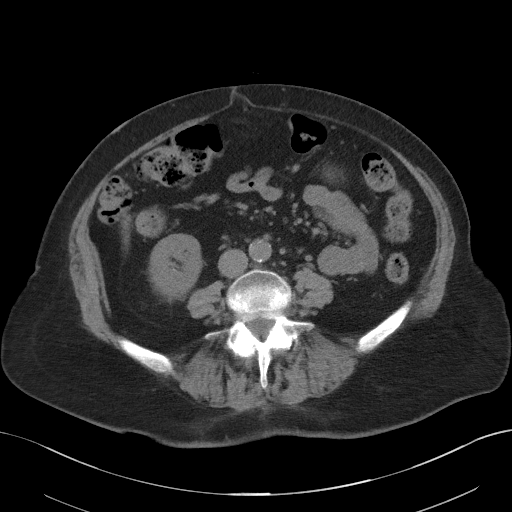
[im 51/91  soft-tissue]
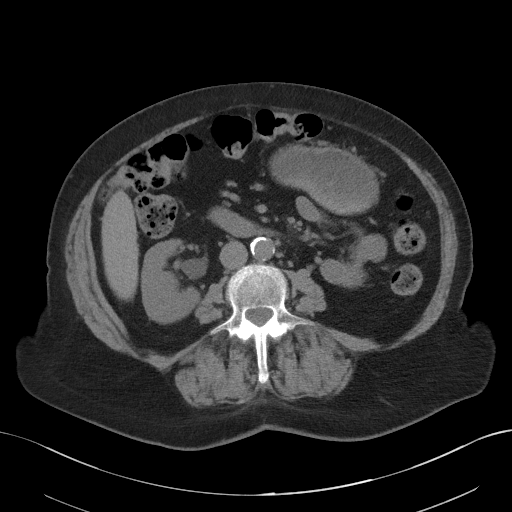
[im 58/91  soft-tissue]
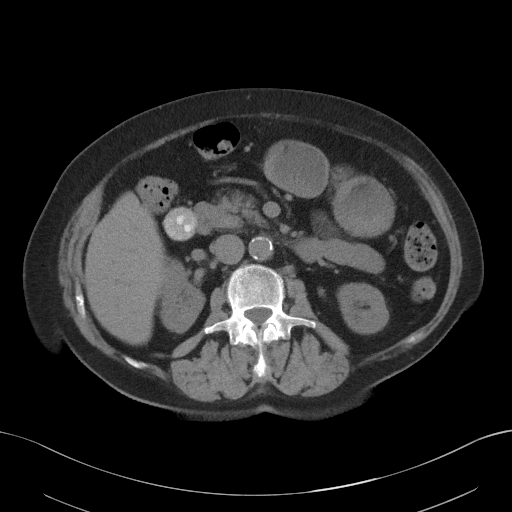
[im 58/91  bone]
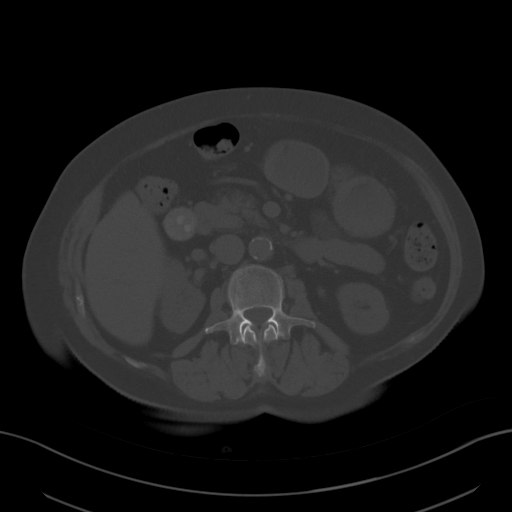
[im 65/91  soft-tissue]
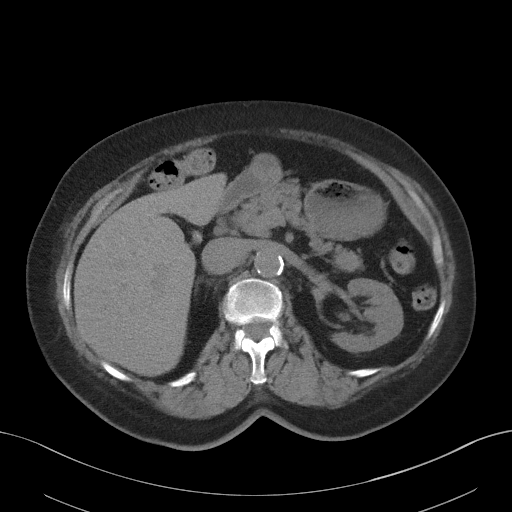
[im 73/91  soft-tissue]
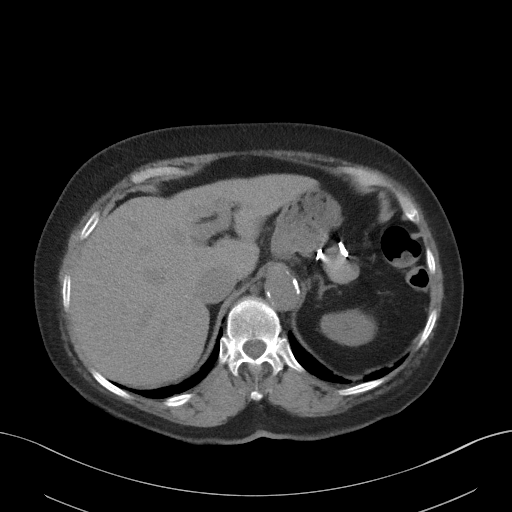
[im 80/91  soft-tissue]
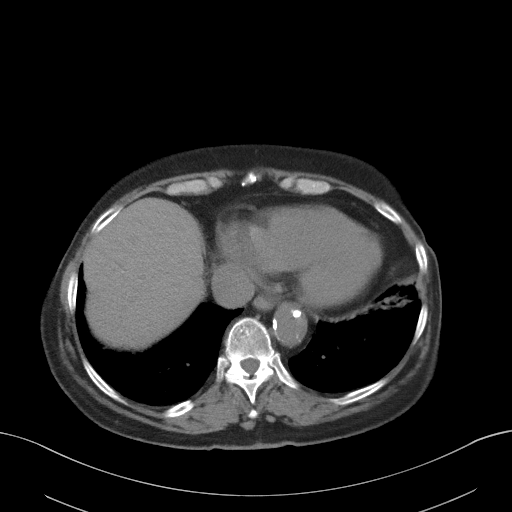
[im 87/91  soft-tissue]
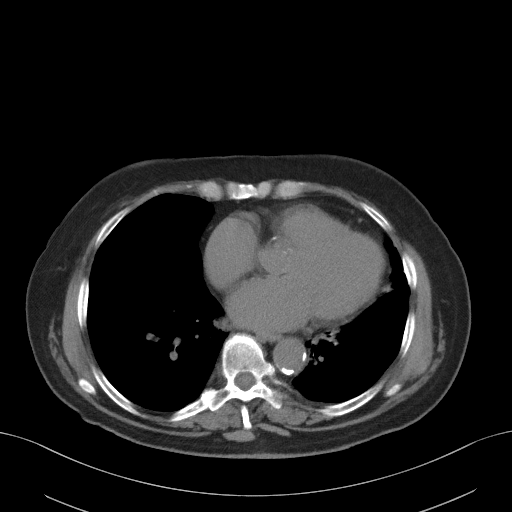

[Series 5: coronal st · coronal · 0.79mm/px · 3 of 97 slices shown]
[im 33/97  soft-tissue]
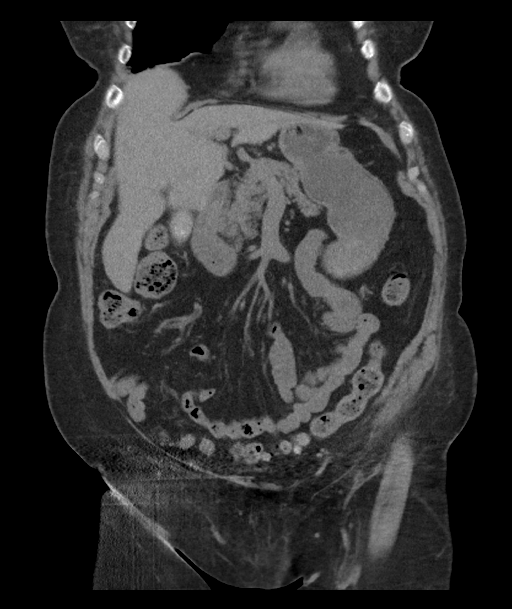
[im 43/97  soft-tissue]
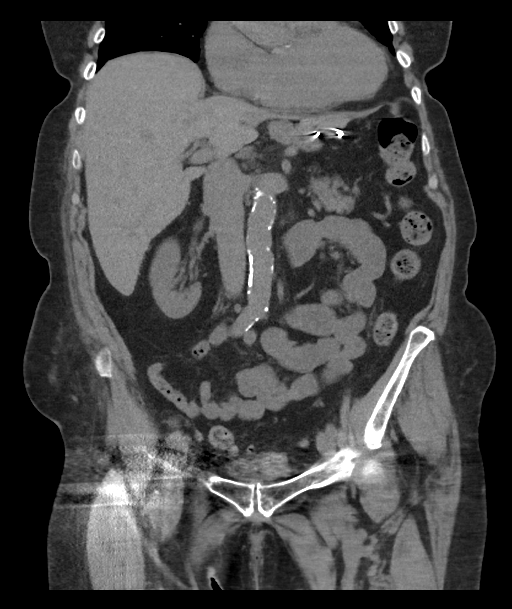
[im 54/97  soft-tissue]
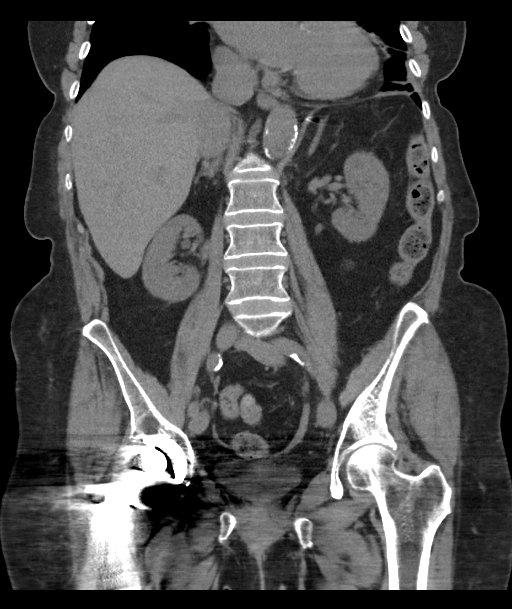

[16 of 46 positions shown; findings below may reference images not displayed]

FINDINGS: Lower chest: Scarring in the right middle lobe and lingula. Mild
cardiomegaly. Aortic valve calcification. Descending thoracic aortic
atherosclerosis.

Hepatobiliary: Numerous gallstones fill the gallbladder. Otherwise
unremarkable noncontrast CT appearance of the liver.

Pancreas: Unremarkable

Spleen: Splenectomy. Probable regenerative splenic tissue in the
left upper quadrant adjacent to the vascular clips.

Adrenals/Urinary Tract: Adrenal glands normal. 9 mm in long axis
calculus in the left collecting system on image [DATE], currently
nonobstructive. No hydronephrosis or hydroureter. Right distal
ureter and right-sided urinary bladder obscured by streak artifact
from the patient's hip implant. There are 2-3 tiny punctate
densities in the right kidney lower pole likely representing small 1
mm nonobstructive renal calculi.

Stomach/Bowel: Unremarkable

Vascular/Lymphatic: Aortoiliac atherosclerotic vascular disease.

Scattered lymph nodes in the porta hepatis are not pathologically
enlarged but are notable in number and could be reactive.

Reproductive: Uterus absent.  Adnexa unremarkable.

Other: No supplemental non-categorized findings.

Musculoskeletal: Small umbilical hernia contains adipose tissue.
Mild right foraminal impingement at L5-S1 due to spurring and disc
bulge. Loss of disc height noted along with disc desiccation at
L5-S1.

Mild findings of chronic avascular necrosis in the left femoral
head, without current contour abnormality.
IMPRESSION: 1. Nonobstructive 9 mm in long axis left collecting system calculus,
with suspected punctate 1 mm nonobstructive calculi in the right
kidney lower pole.
2. Other imaging findings of potential clinical significance:
Bibasilar scarring. Mild cardiomegaly. Aortic valve calcification.
Aortic Atherosclerosis ([id number]). Cholelithiasis. Splenectomy
with some spur regenerative splenic tissue. Potentially mildly
reactive lymph nodes in the porta hepatis. Small umbilical hernia
contains adipose tissue. Right impingement at L5-S1. Chronic
avascular necrosis of the left femoral head.

## 2018-10-30 NOTE — Patient Instructions (Addendum)
Medication Instructions:   If you need a refill on your cardiac medications before your next appointment, please call your pharmacy.   Lab work:  If you have labs (blood work) drawn today and your tests are completely normal, you will receive your results only by: Marland Kitchen MyChart Message (if you have MyChart) OR . A paper copy in the mail If you have any lab test that is abnormal or we need to change your treatment, we will call you to review the results.  Testing/Procedures:   Follow-Up: At [facility], you and your health needs are our priority.  As part of our continuing mission to provide you with exceptional heart care, we have created designated Provider Care Teams.  These Care Teams include your primary Cardiologist (physician) and Advanced Practice Providers (APPs -  Physician Assistants and Nurse Practitioners) who all work together to provide you with the care you need, when you need it. You will need a follow up appointment in 12 months.  Please call our office 2 months in advance to schedule this appointment.  You may see Jenkins Rouge, MD or one of the following Advanced Practice Providers on your designated Care Team:   Truitt Merle, NP Cecilie Kicks, NP . Kathyrn Drown, NP

## 2018-11-12 ENCOUNTER — Other Ambulatory Visit: Payer: Self-pay

## 2018-11-12 ENCOUNTER — Ambulatory Visit (INDEPENDENT_AMBULATORY_CARE_PROVIDER_SITE_OTHER): Payer: Medicare Other | Admitting: General Practice

## 2018-11-12 DIAGNOSIS — Z7901 Long term (current) use of anticoagulants: Secondary | ICD-10-CM

## 2018-11-12 DIAGNOSIS — Z86718 Personal history of other venous thrombosis and embolism: Secondary | ICD-10-CM

## 2018-11-12 LAB — POCT INR: INR: 3 (ref 2.0–3.0)

## 2018-11-12 NOTE — Patient Instructions (Signed)
Pre visit review using our clinic review tool, if applicable. No additional management support is needed unless otherwise documented below in the visit note.  Hold coumadin today and then continue to take 1 tablet daily with 1/2 tablet on Mondays/Wednesdays and Fridays. Re-check 6 to 5 weeks.

## 2018-11-12 NOTE — Progress Notes (Signed)
Medical screening examination/treatment/procedure(s) were performed by non-physician practitioner and as supervising physician I was immediately available for consultation/collaboration. I agree with above. James John, MD   

## 2018-11-26 ENCOUNTER — Encounter: Payer: Self-pay | Admitting: Family Medicine

## 2018-11-26 ENCOUNTER — Ambulatory Visit (INDEPENDENT_AMBULATORY_CARE_PROVIDER_SITE_OTHER): Payer: Medicare Other | Admitting: Family Medicine

## 2018-11-26 ENCOUNTER — Other Ambulatory Visit: Payer: Self-pay

## 2018-11-26 VITALS — BP 126/76 | HR 89 | Temp 97.9°F | Resp 18 | Wt 176.0 lb

## 2018-11-26 DIAGNOSIS — Z87440 Personal history of urinary (tract) infections: Secondary | ICD-10-CM

## 2018-11-26 DIAGNOSIS — E782 Mixed hyperlipidemia: Secondary | ICD-10-CM

## 2018-11-26 DIAGNOSIS — E669 Obesity, unspecified: Secondary | ICD-10-CM

## 2018-11-26 DIAGNOSIS — I10 Essential (primary) hypertension: Secondary | ICD-10-CM | POA: Diagnosis not present

## 2018-11-26 DIAGNOSIS — E1169 Type 2 diabetes mellitus with other specified complication: Secondary | ICD-10-CM

## 2018-11-26 DIAGNOSIS — F411 Generalized anxiety disorder: Secondary | ICD-10-CM

## 2018-11-26 DIAGNOSIS — N200 Calculus of kidney: Secondary | ICD-10-CM

## 2018-11-26 LAB — COMPREHENSIVE METABOLIC PANEL
ALT: 11 U/L (ref 0–35)
AST: 18 U/L (ref 0–37)
Albumin: 4.2 g/dL (ref 3.5–5.2)
Alkaline Phosphatase: 56 U/L (ref 39–117)
BUN: 24 mg/dL — ABNORMAL HIGH (ref 6–23)
CO2: 31 mEq/L (ref 19–32)
Calcium: 9.7 mg/dL (ref 8.4–10.5)
Chloride: 102 mEq/L (ref 96–112)
Creatinine, Ser: 0.81 mg/dL (ref 0.40–1.20)
GFR: 67.54 mL/min (ref >60.00)
Glucose, Bld: 117 mg/dL — ABNORMAL HIGH (ref 70–99)
Potassium: 4.4 mEq/L (ref 3.5–5.1)
Sodium: 140 mEq/L (ref 135–145)
Total Bilirubin: 0.6 mg/dL (ref 0.2–1.2)
Total Protein: 7.3 g/dL (ref 6.0–8.3)

## 2018-11-26 LAB — URINALYSIS
Bilirubin Urine: NEGATIVE
Hgb urine dipstick: NEGATIVE
Ketones, ur: NEGATIVE
Leukocytes,Ua: NEGATIVE
Nitrite: NEGATIVE
Specific Gravity, Urine: 1.02 (ref 1.000–1.030)
Total Protein, Urine: NEGATIVE
Urine Glucose: NEGATIVE
Urobilinogen, UA: 0.2 (ref 0.0–1.0)
pH: 6 (ref 5.0–8.0)

## 2018-11-26 LAB — CBC WITH DIFFERENTIAL/PLATELET
Basophils Absolute: 0.1 10*3/uL (ref 0.0–0.1)
Basophils Relative: 1.1 % (ref 0.0–3.0)
Eosinophils Absolute: 0.2 10*3/uL (ref 0.0–0.7)
Eosinophils Relative: 1.7 % (ref 0.0–5.0)
HCT: 44 % (ref 36.0–46.0)
Hemoglobin: 14.4 g/dL (ref 12.0–15.0)
Lymphocytes Relative: 49 % — ABNORMAL HIGH (ref 12.0–46.0)
Lymphs Abs: 4.4 10*3/uL — ABNORMAL HIGH (ref 0.7–4.0)
MCHC: 32.8 g/dL (ref 30.0–36.0)
MCV: 93.1 fl (ref 78.0–100.0)
Monocytes Absolute: 1.2 10*3/uL — ABNORMAL HIGH (ref 0.1–1.0)
Monocytes Relative: 13.7 % — ABNORMAL HIGH (ref 3.0–12.0)
Neutro Abs: 3.1 10*3/uL (ref 1.4–7.7)
Neutrophils Relative %: 34.5 % — ABNORMAL LOW (ref 43.0–77.0)
Platelets: 331 10*3/uL (ref 150.0–400.0)
RBC: 4.73 Mil/uL (ref 3.87–5.11)
RDW: 14.3 % (ref 11.5–15.5)
WBC: 9 10*3/uL (ref 4.0–10.5)

## 2018-11-26 LAB — HEMOGLOBIN A1C: Hgb A1c MFr Bld: 6.8 % — ABNORMAL HIGH (ref 4.6–6.5)

## 2018-11-26 LAB — LIPID PANEL
Cholesterol: 218 mg/dL — ABNORMAL HIGH (ref 0–200)
HDL: 33 mg/dL — ABNORMAL LOW (ref >39.00)
NonHDL: 185.45
Total CHOL/HDL Ratio: 7
Triglycerides: 337 mg/dL — ABNORMAL HIGH (ref 0.0–149.0)
VLDL: 67.4 mg/dL — ABNORMAL HIGH (ref 0.0–40.0)

## 2018-11-26 LAB — LDL CHOLESTEROL, DIRECT: Direct LDL: 115 mg/dL

## 2018-11-26 NOTE — Assessment & Plan Note (Signed)
She is managing fairly well through the pandemic all things considered. Did just loose a sister to dementia and is sad about that but overall is managing her anxiety well.

## 2018-11-26 NOTE — Assessment & Plan Note (Signed)
hgba1c acceptable, minimize simple carbs. Increase exercise as tolerated. Continue current meds 

## 2018-11-26 NOTE — Assessment & Plan Note (Signed)
Due to increased fatigue and history will check UA and culture

## 2018-11-26 NOTE — Patient Instructions (Signed)
Pulse oximeter, want oxygen levels in 90s at [facility], [facility], or [employer] or [employer]  Weekly vitals Hypertension, Adult Hypertension is another name for high blood pressure. High blood pressure forces your heart to work harder to pump blood. This can cause problems over time. There are two numbers in a blood pressure reading. There is a top number (systolic) over a bottom number (diastolic). It is best to have a blood pressure that is below 120/80. Healthy choices can help lower your blood pressure, or you may need medicine to help lower it. What are the causes? The cause of this condition is not known. Some conditions may be related to high blood pressure. What increases the risk?  Smoking.  Having type 2 diabetes mellitus, high cholesterol, or both.  Not getting enough exercise or physical activity.  Being overweight.  Having too much fat, sugar, calories, or salt (sodium) in your diet.  Drinking too much alcohol.  Having long-term (chronic) kidney disease.  Having a family history of high blood pressure.  Age. Risk increases with age.  Race. You may be at higher risk if you are African American.  Gender. Men are at higher risk than women before age 23. After age 78, women are at higher risk than men.  Having obstructive sleep apnea.  Stress. What are the signs or symptoms?  High blood pressure may not cause symptoms. Very high blood pressure (hypertensive crisis) may cause: ? Headache. ? Feelings of worry or nervousness (anxiety). ? Shortness of breath. ? Nosebleed. ? A feeling of being sick to your stomach (nausea). ? Throwing up (vomiting). ? Changes in how you see. ? Very bad chest pain. ? Seizures. How is this treated?  This condition is treated by making healthy lifestyle changes, such as: ? Eating healthy foods. ? Exercising more. ? Drinking less alcohol.  Your health care provider may prescribe medicine if lifestyle changes are not enough to get your blood  pressure under control, and if: ? Your top number is above 130. ? Your bottom number is above 80.  Your personal target blood pressure may vary. Follow these instructions at home: Eating and drinking   If told, follow the DASH eating plan. To follow this plan: ? Fill one half of your plate at each meal with fruits and vegetables. ? Fill one fourth of your plate at each meal with whole grains. Whole grains include whole-wheat pasta, brown rice, and whole-grain bread. ? Eat or drink low-fat dairy products, such as skim milk or low-fat yogurt. ? Fill one fourth of your plate at each meal with low-fat (lean) proteins. Low-fat proteins include fish, chicken without skin, eggs, beans, and tofu. ? Avoid fatty meat, cured and processed meat, or chicken with skin. ? Avoid pre-made or processed food.  Eat less than 1,500 mg of salt each day.  Do not drink alcohol if: ? Your doctor tells you not to drink. ? You are pregnant, may be pregnant, or are planning to become pregnant.  If you drink alcohol: ? Limit how much you use to:  0-1 drink a day for women.  0-2 drinks a day for men. ? Be aware of how much alcohol is in your drink. In the [place] one drink equals one 12 oz bottle of beer (355 mL), one 5 oz glass of wine (148 mL), or one 1 oz glass of hard liquor (44 mL). Lifestyle   Work with your doctor to stay at a healthy weight or to lose weight. Ask your  doctor what the best weight is for you.  Get at least 30 minutes of exercise most days of the week. This may include walking, swimming, or biking.  Get at least 30 minutes of exercise that strengthens your muscles (resistance exercise) at least 3 days a week. This may include lifting weights or doing Pilates.  Do not use any products that contain nicotine or tobacco, such as cigarettes, e-cigarettes, and chewing tobacco. If you need help quitting, ask your doctor.  Check your blood pressure at home as told by your doctor.  Keep all  follow-up visits as told by your doctor. This is important. Medicines  Take over-the-counter and prescription medicines only as told by your doctor. Follow directions carefully.  Do not skip doses of blood pressure medicine. The medicine does not work as well if you skip doses. Skipping doses also puts you at risk for problems.  Ask your doctor about side effects or reactions to medicines that you should watch for. Contact a doctor if you:  Think you are having a reaction to the medicine you are taking.  Have headaches that keep coming back (recurring).  Feel dizzy.  Have swelling in your ankles.  Have trouble with your vision. Get help right away if you:  Get a very bad headache.  Start to feel mixed up (confused).  Feel weak or numb.  Feel faint.  Have very bad pain in your: ? Chest. ? Belly (abdomen).  Throw up more than once.  Have trouble breathing. Summary  Hypertension is another name for high blood pressure.  High blood pressure forces your heart to work harder to pump blood.  For most people, a normal blood pressure is less than 120/80.  Making healthy choices can help lower blood pressure. If your blood pressure does not get lower with healthy choices, you may need to take medicine. This information is not intended to replace advice given to you by your health care provider. Make sure you discuss any questions you have with your health care provider. Document Released: 07/05/2007 Document Revised: 09/26/2017 Document Reviewed: 09/26/2017 Elsevier Patient Education  2020 [employer].

## 2018-11-26 NOTE — Assessment & Plan Note (Signed)
Well controlled, no changes to meds. Encouraged heart healthy diet such as the DASH diet and exercise as tolerated.  °

## 2018-11-26 NOTE — Assessment & Plan Note (Signed)
She has been following with Dr Amalia Hailey of [facility] urology and would like to switch her care to [place] is referred to [facility] for ongoing surveillance. Asymptomatic today

## 2018-11-26 NOTE — Assessment & Plan Note (Signed)
Encouraged heart healthy diet, increase exercise, avoid trans fats, consider a krill oil cap daily 

## 2018-11-26 NOTE — Progress Notes (Signed)
Subjective:    Patient ID: [name] Little, female    DOB: 06-14-1933, 85 y.o.   MRN: [id number]  No chief complaint on file.   HPI Patient is in today for follow up on chronic medical concerns including hypertension, anxiety, diabetes and more. She feels well today. Her only concern is fatigue. No recent febrile illness or hospitalizations. Her sugars are well controled ranging from 80-130s. No polyuria or polydipsia. She had a sister die from advanced Alzheimer's dementia recently and while she is sad is managing it well. Denies CP/palp/SOB/HA/congestion/fevers/GI or GU c/o. Taking meds as prescribed  Past Medical History:  Diagnosis Date  . Anxiety state, unspecified 09/22/2013  . Arthritis    lumbar stenosis, radiculopathy, OA- L hip  . BCC (basal cell carcinoma), scalp/neck 09/27/2015  . Blood clotting disorder (HCC)    V5 factor  . Blood dyscrasia    factor 5 deficiency  . Breast cancer ([place]) 2010   RIGHT breast  . Clostridium difficile infection   . Clotting disorder ([place])   . Diabetes mellitus type 2 in obese ([place]) 02/26/2014  . DVT (deep venous thrombosis) ([place])   . Factor V deficiency ([place])   . Hemorrhoids   . Hyperglycemia 02/26/2014  . Hyperlipemia   . Hypertension   . Infiltrating ductal carcinoma of breast ([place])    stage one right   . Low back pain 03/26/2014  . Macular degeneration   . Mitral valve regurgitation 09/01/2016  . Mixed hyperlipidemia 05/18/2015  . Nocturia 02/26/2014  . Paroxysmal supraventricular tachycardia ([place])   . Personal history of colonic polyps    hyperplastic  . Thoracic aortic aneurysm ([place])   . TMJ pain dysfunction syndrome     Past Surgical History:  Procedure Laterality Date  . ABDOMINAL HYSTERECTOMY     1960- ? vaginal hysterectomy, not laparoscopic   . BREAST LUMPECTOMY  2010   right Dr. [name]  . COLONOSCOPY    . EYE SURGERY     bilateral cataracts- /w IOL   . I&D EXTREMITY Right 10/26/2017   Procedure: IRRIGATION AND  DEBRIDEMENT RIGHT HAND;  Surgeon: Iran Planas, MD;  Location: [place];  Service: Orthopedics;  Laterality: Right;  . JOINT REPLACEMENT     R hip replacement   . LAPAROSCOPIC HYSTERECTOMY    . LUMBAR LAMINECTOMY/DECOMPRESSION MICRODISCECTOMY  04/11/2011   Procedure: LUMBAR LAMINECTOMY/DECOMPRESSION MICRODISCECTOMY;  Surgeon: [name], MD;  Location: [place] [facility];  Service: Neurosurgery;  Laterality: N/A;  Lumbar Five-Sacral One Microdiscectomy  . right calf surgery     for cancer  . SPLENECTOMY    . TOTAL HIP ARTHROPLASTY  1999   right  . UPPER GASTROINTESTINAL ENDOSCOPY      Family History  Problem Relation Age of Onset  . Heart disease Mother   . Heart disease Father   . Alzheimer's disease Sister   . Dementia Sister   . Dementia Sister   . Heart disease Other        uncles  . Clotting disorder Maternal Uncle   . Breast cancer Paternal Aunt   . Cancer Paternal Aunt        breast  . Stroke Sister   . Dementia Sister        mean, carotid artery disease  . Colon cancer Neg Hx   . Anesthesia problems Neg Hx   . Hypotension Neg Hx   . Malignant hyperthermia Neg Hx   . Pseudochol deficiency Neg Hx     Social History  Socioeconomic History  . Marital status: Married    Spouse name: Not on file  . Number of children: 4  . Years of education: Not on file  . Highest education level: Not on file  Occupational History  . Occupation: Retired  Scientific laboratory technician  . Financial resource strain: Not on file  . Food insecurity    Worry: Not on file    Inability: Not on file  . Transportation needs    Medical: Not on file    Non-medical: Not on file  Tobacco Use  . Smoking status: Never Smoker  . Smokeless tobacco: Never Used  Substance and Sexual Activity  . Alcohol use: No    Alcohol/week: 0.0 standard drinks  . Drug use: No  . Sexual activity: Never    Partners: Male  Lifestyle  . Physical activity    Days per week: Not on file    Minutes per session: Not on file  .  Stress: Not on file  Relationships  . Social Herbalist on phone: Not on file    Gets together: Not on file    Attends religious service: Not on file    Active member of club or organization: Not on file    Attends meetings of clubs or organizations: Not on file    Relationship status: Not on file  . Intimate partner violence    Fear of current or ex partner: Not on file    Emotionally abused: Not on file    Physically abused: Not on file    Forced sexual activity: Not on file  Other Topics Concern  . Not on file  Social History Narrative   Married 1955   2 sons - '59, '61, 2 daughters '55, '57; 8 grandchildren; 1 great grand   I-ADLs       Outpatient Medications Prior to Visit  Medication Sig Dispense Refill  . ALPRAZolam (XANAX) 0.25 MG tablet TAKE ONE TABLET BY MOUTH TWICE DIALY AS NEEDED FOR ANXIETY 20 tablet 1  . B Complex Vitamins (VITAMIN B COMPLEX PO) Take 1 tablet by mouth daily.     . Blood Glucose Monitoring Suppl (ONE TOUCH ULTRA 2) w/Device KIT See admin instructions.  0  . Blood Pressure Monitoring (BLOOD PRESSURE KIT) DEVI 1 Device by Does not apply route daily as needed. Fill per patient insurance preference Dx: Hypertension 1 Device 0  . furosemide (LASIX) 20 MG tablet TAKE 1 TABLET (20 MG TOTAL) BY MOUTH DAILY AS NEEDED FOR FLUID OR EDEMA. 30 tablet 0  . glucose blood (IGLUCOSE TEST STRIPS) test strip Test Blood Sugars every day as needed 100 each 5  . glucose monitoring kit (FREESTYLE) monitoring kit 1 each by Does not apply route as needed for other. 1 each 0  . hydrochlorothiazide (MICROZIDE) 12.5 MG capsule TAKE 1 CAPSULE BY MOUTH EVERY DAY 30 capsule 2  . KLOR-CON M20 20 MEQ tablet TAKE 1 TABLET (20 MEQ TOTAL) BY MOUTH DAILY AS NEEDED (LASIX USE). 30 tablet 0  . Lancets MISC Test Blood Sugars every day as needed 100 each 5  . losartan (COZAAR) 100 MG tablet TAKE 1 TABLET BY MOUTH EVERY DAY 90 tablet 0  . metoprolol succinate (TOPROL-XL) 100 MG 24  hr tablet Take 1 tablet (100 mg total) by mouth daily. Take with or immediately following a meal. 90 tablet 1  . Multiple Vitamins-Minerals (MULTIVITAMIN ADULTS) TABS Take 1 tablet by mouth daily.    Marland Kitchen warfarin (COUMADIN) 5 MG  tablet Take 0.5-1 tablets (2.5-5 mg total) by mouth See admin instructions. Take 1 tablet (5 mg) by mouth daily except on Sun Take 0.5 tablet (2.5 mg) 90 tablet 1   No facility-administered medications prior to visit.     Allergies  Allergen Reactions  . Hydralazine Other (See Comments)    Joint and chest pain  . Amlodipine Swelling    "my whole body swelled"  . Carvedilol Swelling    "my whole body swelled"  . Cefuroxime Axetil     REACTION: Ddoes not remember reaction  . Chlorthalidone     Patient reported extreme weakness.  . Statins Other (See Comments)    Weakness, pain Lipitor and Pravachol Weakness, pain Lipitor and Pravachol  . Sulfonamide Derivatives     REACTION: Rash    Review of Systems  Constitutional: Positive for malaise/fatigue. Negative for fever.  HENT: Negative for congestion.   Eyes: Negative for blurred vision.  Respiratory: Negative for shortness of breath.   Cardiovascular: Negative for chest pain, palpitations and leg swelling.  Gastrointestinal: Negative for abdominal pain, blood in stool and nausea.  Genitourinary: Positive for frequency. Negative for dysuria.  Musculoskeletal: Negative for falls.  Skin: Negative for rash.  Neurological: Negative for dizziness, loss of consciousness and headaches.  Endo/Heme/Allergies: Negative for environmental allergies.  Psychiatric/Behavioral: Negative for depression. The patient is not nervous/anxious.        Objective:    Physical Exam Vitals signs and nursing note reviewed.  Constitutional:      General: She is not in acute distress.    Appearance: She is well-developed.  HENT:     Head: Normocephalic and atraumatic.     Nose: Nose normal.  Eyes:     General:        Right  eye: No discharge.        Left eye: No discharge.  Neck:     Musculoskeletal: Normal range of motion and neck supple.  Cardiovascular:     Rate and Rhythm: Normal rate and regular rhythm.     Heart sounds: No murmur.  Pulmonary:     Effort: Pulmonary effort is normal.     Breath sounds: Normal breath sounds.  Abdominal:     General: Bowel sounds are normal.     Palpations: Abdomen is soft.     Tenderness: There is no abdominal tenderness.  Skin:    General: Skin is warm and dry.  Neurological:     Mental Status: She is alert and oriented to person, place, and time.     BP 126/76 (BP Location: Left Arm, Patient Position: Sitting, Cuff Size: Normal)   Pulse 89   Temp 97.9 F (36.6 C) (Temporal)   Resp 18   Wt 176 lb (79.8 kg)   SpO2 93%   BMI 31.18 kg/m  Wt Readings from Last 3 Encounters:  11/26/18 176 lb (79.8 kg)  10/30/18 177 lb (80.3 kg)  08/26/18 179 lb (81.2 kg)    Diabetic Foot Exam - Simple   No data filed     Lab Results  Component Value Date   WBC 10.4 09/11/2018   HGB 14.5 09/11/2018   HCT 43.6 09/11/2018   PLT 333.0 09/11/2018   GLUCOSE 99 08/26/2018   CHOL 219 (H) 09/17/2017   TRIG 315.0 (H) 09/17/2017   HDL 36.40 (L) 09/17/2017   LDLDIRECT 121.0 09/17/2017   LDLCALC 134 (H) 03/05/2017   ALT 14 08/26/2018   AST 21 08/26/2018   NA 142 08/26/2018  K 4.6 08/26/2018   CL 103 08/26/2018   CREATININE 0.99 08/26/2018   BUN 20 08/26/2018   CO2 28 08/26/2018   TSH 3.93 08/26/2018   INR 3.0 11/12/2018   HGBA1C 6.7 (H) 08/26/2018   MICROALBUR 1.5 10/20/2014    Lab Results  Component Value Date   TSH 3.93 08/26/2018   Lab Results  Component Value Date   WBC 10.4 09/11/2018   HGB 14.5 09/11/2018   HCT 43.6 09/11/2018   MCV 92.8 09/11/2018   PLT 333.0 09/11/2018   Lab Results  Component Value Date   NA 142 08/26/2018   K 4.6 08/26/2018   CHLORIDE 109 08/12/2013   CO2 28 08/26/2018   GLUCOSE 99 08/26/2018   BUN 20 08/26/2018    CREATININE 0.99 08/26/2018   BILITOT 0.5 08/26/2018   ALKPHOS 64 08/26/2018   AST 21 08/26/2018   ALT 14 08/26/2018   PROT 7.7 08/26/2018   ALBUMIN 4.3 08/26/2018   CALCIUM 10.0 08/26/2018   ANIONGAP 11 10/27/2017   GFR 53.61 (L) 08/26/2018   Lab Results  Component Value Date   CHOL 219 (H) 09/17/2017   Lab Results  Component Value Date   HDL 36.40 (L) 09/17/2017   Lab Results  Component Value Date   LDLCALC 134 (H) 03/05/2017   Lab Results  Component Value Date   TRIG 315.0 (H) 09/17/2017   Lab Results  Component Value Date   CHOLHDL 6 09/17/2017   Lab Results  Component Value Date   HGBA1C 6.7 (H) 08/26/2018       Assessment & Plan:   Problem List Items Addressed This Visit    Hyperlipidemia    Encouraged heart healthy diet, increase exercise, avoid trans fats, consider a krill oil cap daily      Relevant Orders   Lipid panel   Essential hypertension    Well controlled, no changes to meds. Encouraged heart healthy diet such as the DASH diet and exercise as tolerated.       Relevant Orders   CBC with Differential/Platelet   Comprehensive metabolic panel   TSH   Anxiety state    She is managing fairly well through the pandemic all things considered. Did just loose a sister to dementia and is sad about that but overall is managing her anxiety well.       Diabetes mellitus type 2 in obese ([place]) - Primary    hgba1c acceptable, minimize simple carbs. Increase exercise as tolerated. Continue current meds      Relevant Orders   Hemoglobin A1c   Comprehensive metabolic panel   History of UTI    Due to increased fatigue and history will check UA and culture      Relevant Orders   Urinalysis   Urine Culture   Ambulatory referral to Urology   Kidney stones    She has been following with Dr Amalia Hailey of [facility] urology and would like to switch her care to [place] is referred to [facility] for ongoing surveillance. Asymptomatic today      Relevant Orders    Ambulatory referral to Urology      I am having [place] maintain her B Complex Vitamins (VITAMIN B COMPLEX PO), glucose monitoring kit, Lancets, glucose blood, Multivitamin Adults, ONE TOUCH ULTRA 2, ALPRAZolam, Blood Pressure Kit, metoprolol succinate, warfarin, Klor-Con M20, hydrochlorothiazide, losartan, and furosemide.  No orders of the defined types were placed in this encounter.    Penni Homans, MD

## 2018-11-28 LAB — URINE CULTURE
MICRO NUMBER:: 1034730
SPECIMEN QUALITY:: ADEQUATE

## 2018-11-28 LAB — TSH: TSH: 3.18 u[IU]/mL (ref 0.35–4.50)

## 2018-12-09 ENCOUNTER — Other Ambulatory Visit: Payer: Self-pay | Admitting: Family Medicine

## 2018-12-13 ENCOUNTER — Other Ambulatory Visit: Payer: Self-pay | Admitting: Family Medicine

## 2018-12-17 ENCOUNTER — Other Ambulatory Visit: Payer: Self-pay

## 2018-12-17 ENCOUNTER — Ambulatory Visit (INDEPENDENT_AMBULATORY_CARE_PROVIDER_SITE_OTHER): Payer: Medicare Other | Admitting: General Practice

## 2018-12-17 DIAGNOSIS — Z7901 Long term (current) use of anticoagulants: Secondary | ICD-10-CM

## 2018-12-17 DIAGNOSIS — Z86718 Personal history of other venous thrombosis and embolism: Secondary | ICD-10-CM

## 2018-12-17 LAB — POCT INR: INR: 2.5 (ref 2.0–3.0)

## 2018-12-17 NOTE — Patient Instructions (Signed)
Pre visit review using our clinic review tool, if applicable. No additional management support is needed unless otherwise documented below in the visit note.  Continue to take 1 tablet daily with 1/2 tablet on Mondays/Wednesdays and Fridays. Re-check 6 to 5 weeks.

## 2018-12-17 NOTE — Progress Notes (Signed)
Medical screening examination/treatment/procedure(s) were performed by non-physician practitioner and as supervising physician I was immediately available for consultation/collaboration. I agree with above. Awesome Jared, MD   

## 2018-12-31 IMAGING — DX DG HAND COMPLETE 3+V*R*
3 series · 3 of 3 positions shown · non-contrast
Comparison: No recent prior.

CLINICAL DATA: Injury with open wound.

EXAM:
RIGHT HAND - COMPLETE 3+ VIEW

[hand pa]
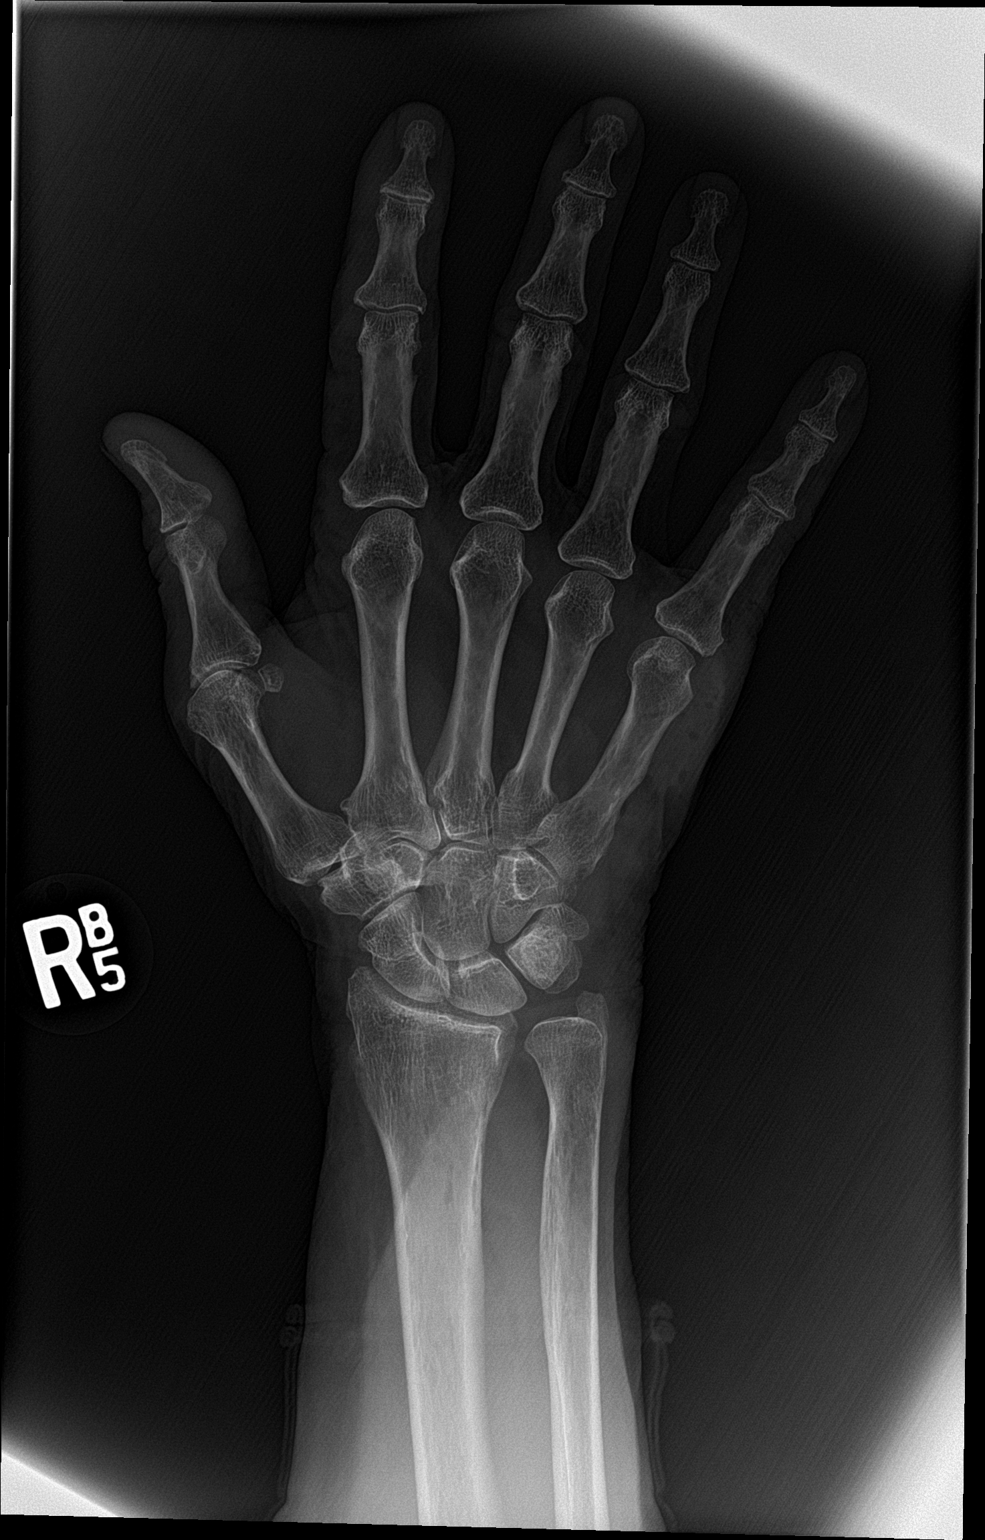

[hand obl]
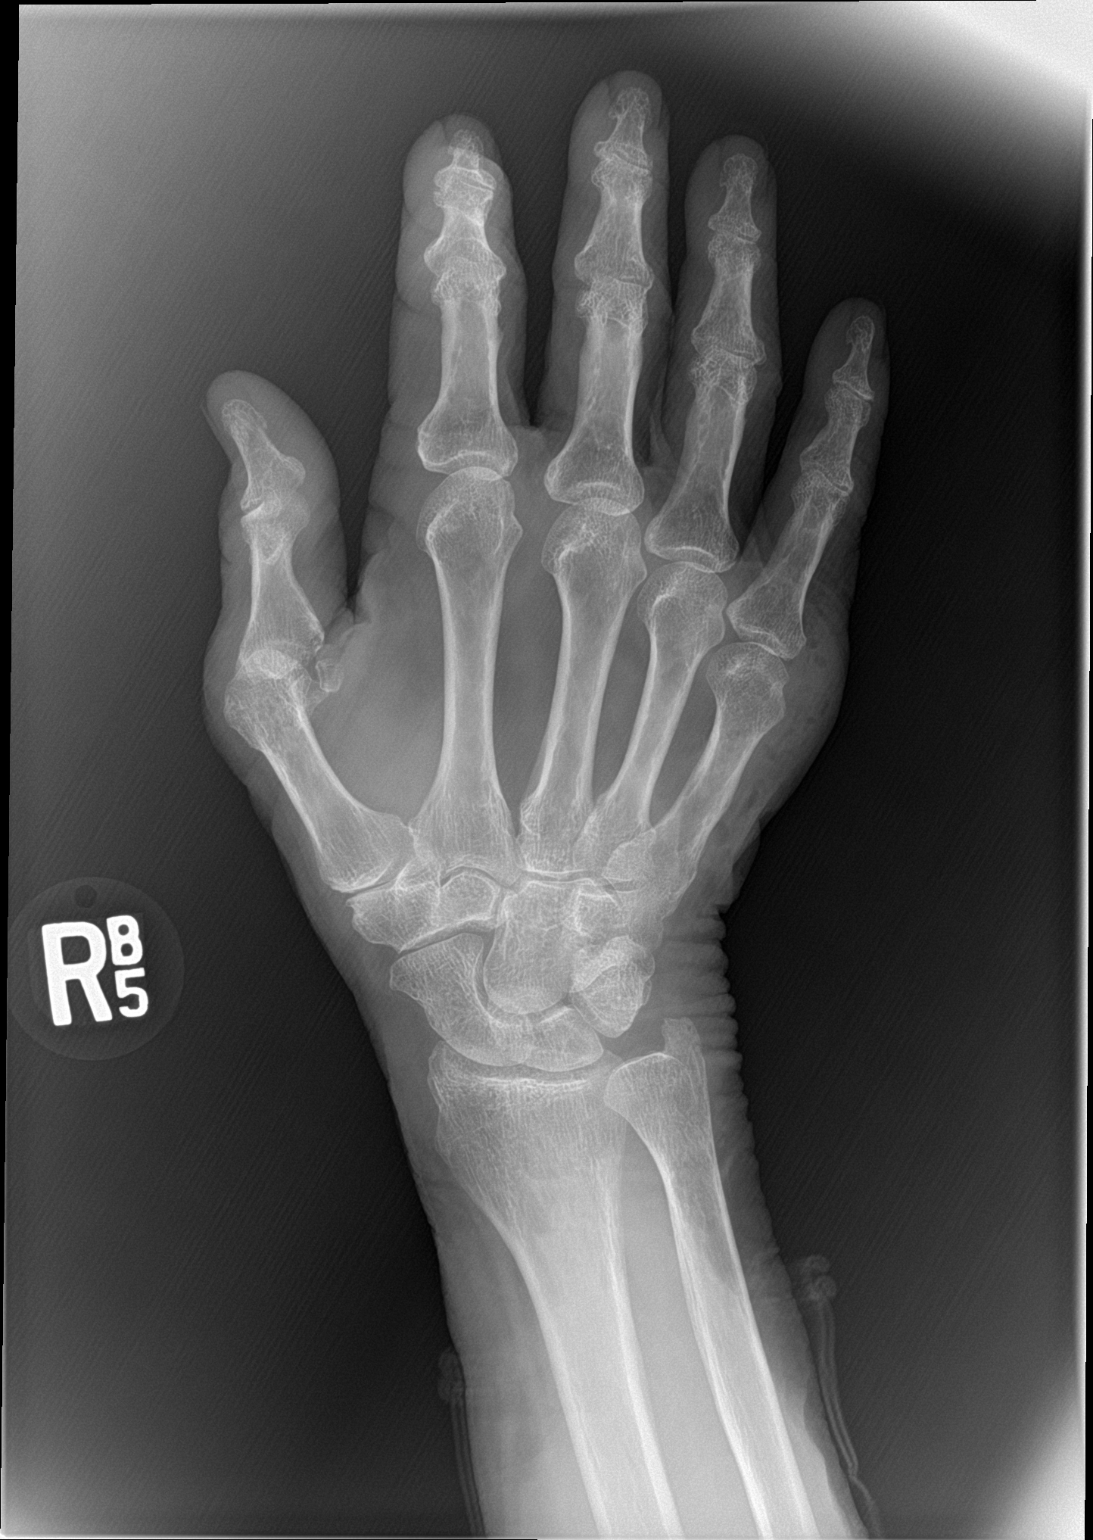

[hand lat]
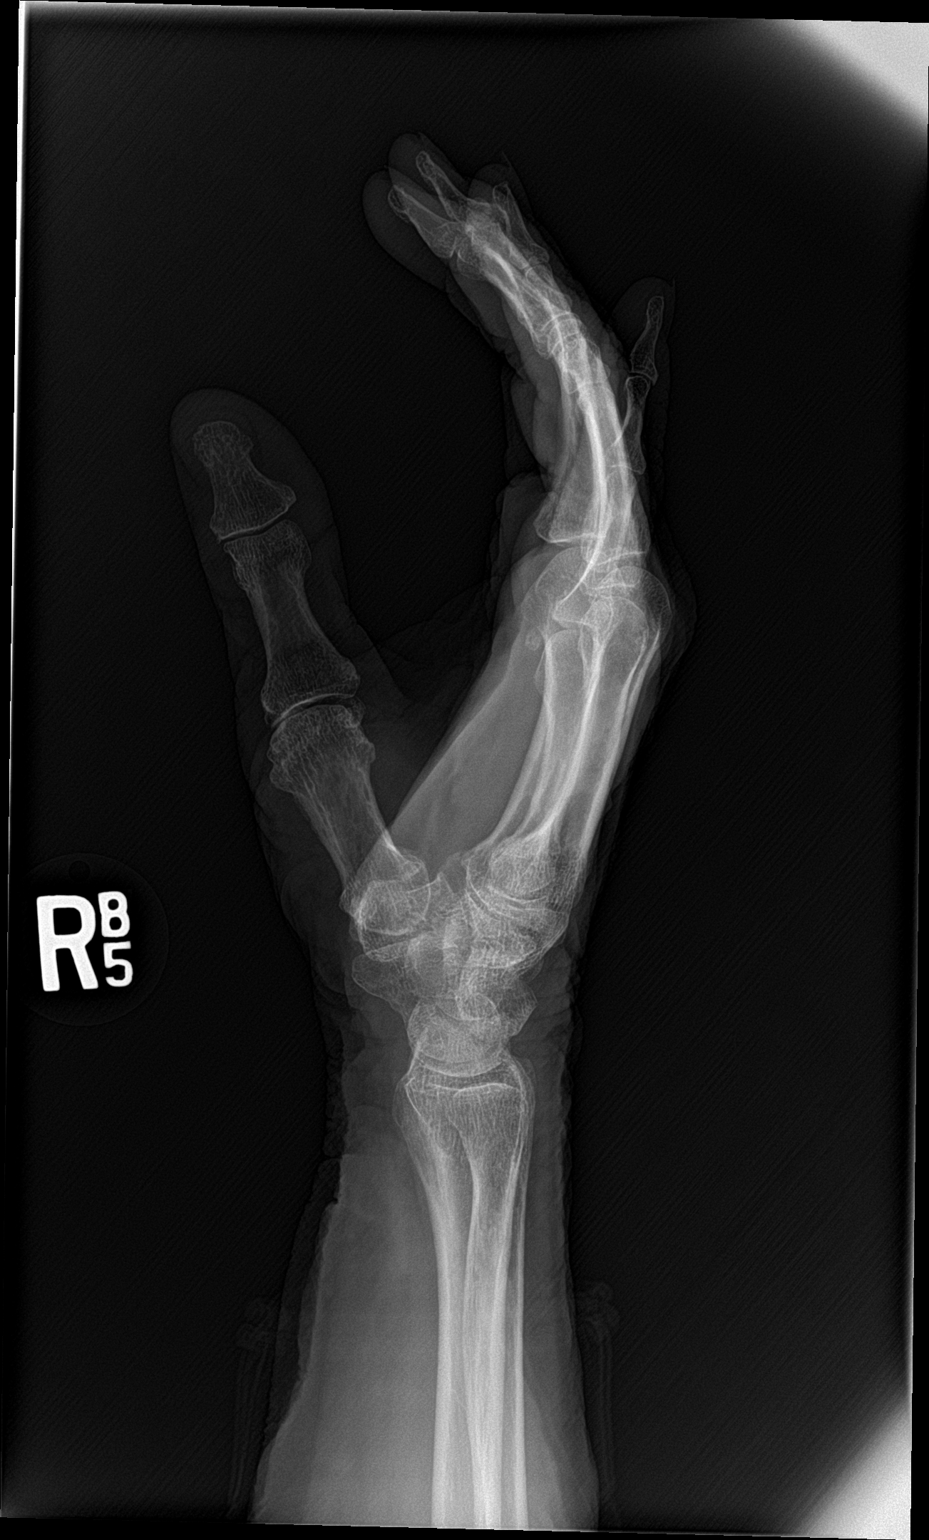

[3 of 3 positions shown; findings below may reference images not displayed]

FINDINGS: Soft tissue laceration and air noted adjacent to the left fifth
metacarpal. No radiopaque foreign body. No acute bony abnormality
identified. No evidence of fracture dislocation. Diffuse osteopenia
degenerative change.
IMPRESSION: Soft tissue laceration air noted adjacent to the left fifth
metacarpal. No radiopaque foreign body. No acute bony abnormality.

## 2019-01-09 ENCOUNTER — Other Ambulatory Visit: Payer: Self-pay

## 2019-01-09 ENCOUNTER — Other Ambulatory Visit: Payer: Self-pay | Admitting: Family Medicine

## 2019-01-09 ENCOUNTER — Other Ambulatory Visit (INDEPENDENT_AMBULATORY_CARE_PROVIDER_SITE_OTHER): Payer: Medicare Other

## 2019-01-09 ENCOUNTER — Telehealth: Payer: Self-pay

## 2019-01-09 DIAGNOSIS — R300 Dysuria: Secondary | ICD-10-CM

## 2019-01-09 LAB — URINALYSIS, ROUTINE W REFLEX MICROSCOPIC
Bilirubin Urine: NEGATIVE
Hgb urine dipstick: NEGATIVE
Ketones, ur: NEGATIVE
Leukocytes,Ua: NEGATIVE
Nitrite: POSITIVE — AB
RBC / HPF: NONE SEEN (ref 0–?)
Specific Gravity, Urine: 1.025 (ref 1.000–1.030)
Total Protein, Urine: NEGATIVE
Urine Glucose: NEGATIVE
Urobilinogen, UA: 0.2 (ref 0.0–1.0)
pH: 5.5 (ref 5.0–8.0)

## 2019-01-09 MED ORDER — AMOXICILLIN 875 MG PO TABS: 875.0000 mg | ORAL_TABLET | Freq: Two times a day (BID) | ORAL | 0 refills | Status: AC

## 2019-01-09 NOTE — Telephone Encounter (Signed)
Patient having urinary symptoms, Per dr. Charlett Blake she can have a urine culture and UA.

## 2019-01-11 LAB — URINE CULTURE
MICRO NUMBER:: 1184938
SPECIMEN QUALITY:: ADEQUATE

## 2019-01-13 ENCOUNTER — Telehealth: Payer: Self-pay

## 2019-01-13 MED ORDER — FLUCONAZOLE 150 MG PO TABS: 150.0000 mg | ORAL_TABLET | ORAL | 0 refills | Status: DC

## 2019-01-13 NOTE — Telephone Encounter (Signed)
Sent diflucan 150mg  to take once weekly sent #[id number]

## 2019-01-13 NOTE — Telephone Encounter (Signed)
Thanks

## 2019-01-13 NOTE — Telephone Encounter (Signed)
Pt stated she was prescribed Amoxicillin and thinks she has developed a yeast infection from taking it and wants something called in for it.  Pharmacy is [place].

## 2019-01-15 ENCOUNTER — Encounter: Payer: Self-pay | Admitting: Family Medicine

## 2019-01-15 ENCOUNTER — Other Ambulatory Visit: Payer: Self-pay | Admitting: Family Medicine

## 2019-01-15 MED ORDER — LEVOFLOXACIN 250 MG PO TABS: 250.0000 mg | ORAL_TABLET | ORAL | 0 refills | Status: DC

## 2019-01-16 ENCOUNTER — Other Ambulatory Visit: Payer: Self-pay | Admitting: Internal Medicine

## 2019-01-16 DIAGNOSIS — Z86718 Personal history of other venous thrombosis and embolism: Secondary | ICD-10-CM

## 2019-01-16 DIAGNOSIS — D682 Hereditary deficiency of other clotting factors: Secondary | ICD-10-CM

## 2019-01-17 ENCOUNTER — Other Ambulatory Visit: Payer: Self-pay | Admitting: Family Medicine

## 2019-01-17 NOTE — Telephone Encounter (Signed)
Last OV 11/26/18 Last refill 05/24/18 #90/1 Next OV 03/31/19

## 2019-01-28 ENCOUNTER — Ambulatory Visit: Payer: Medicare Other

## 2019-02-03 ENCOUNTER — Encounter: Payer: Self-pay | Admitting: Family Medicine

## 2019-02-04 ENCOUNTER — Ambulatory Visit (INDEPENDENT_AMBULATORY_CARE_PROVIDER_SITE_OTHER): Payer: Medicare PPO | Admitting: General Practice

## 2019-02-04 ENCOUNTER — Other Ambulatory Visit: Payer: Self-pay

## 2019-02-04 DIAGNOSIS — Z7901 Long term (current) use of anticoagulants: Secondary | ICD-10-CM

## 2019-02-04 LAB — POCT INR: INR: 2.6 (ref 2.0–3.0)

## 2019-02-04 NOTE — Progress Notes (Signed)
Medical screening examination/treatment/procedure(s) were performed by non-physician practitioner and as supervising physician I was immediately available for consultation/collaboration. I agree with above. Shady Padron, MD   

## 2019-02-04 NOTE — Patient Instructions (Addendum)
.  [id number]  Continue to take 1 tablet daily with 1/2 tablet on Mondays/Wednesdays and Fridays. Re-check 6 to 5 weeks.

## 2019-02-19 ENCOUNTER — Encounter: Payer: Self-pay | Admitting: Family Medicine

## 2019-02-19 ENCOUNTER — Telehealth: Payer: Self-pay

## 2019-02-19 NOTE — Telephone Encounter (Signed)
Patient called in to get a letter of approval for her Covid-19 shot. The current medication that the patient is on is consider a blood thinner. The patient needs Dr. Charlett Blake or the CMA to send a letter to her my chart because the Patient has a schedule Covid-19 shot at 11am at the [facility]. Can  some one please  follow up with the patient by tomorrow morning thanks.

## 2019-02-20 ENCOUNTER — Ambulatory Visit: Payer: Medicare PPO | Attending: Internal Medicine

## 2019-02-20 DIAGNOSIS — Z23 Encounter for immunization: Secondary | ICD-10-CM | POA: Insufficient documentation

## 2019-02-20 NOTE — Telephone Encounter (Signed)
Letter was written and placed up front for patient. Patient made aware

## 2019-02-20 NOTE — Progress Notes (Signed)
   Covid-19 Vaccination Clinic  Name:  [name] Little    MRN: [id number] DOB: Jan 03, 1934  02/20/2019  [name] was observed post Covid-19 immunization for 30 minutes based on pre-vaccination screening without incidence. She was provided with Vaccine Information Sheet and instruction to access the V-Safe system.   [name] was instructed to call 911 with any severe reactions post vaccine: Marland Kitchen Difficulty breathing  . Swelling of your face and throat  . A fast heartbeat  . A bad rash all over your body  . Dizziness and weakness    Immunizations Administered    Name Date Dose VIS Date Route   Pfizer COVID-19 Vaccine 02/20/2019  1:34 PM 0.3 mL 01/10/2019 Intramuscular   Manufacturer: [place]   Lot: [id number]   [place]: [id number]

## 2019-03-03 ENCOUNTER — Ambulatory Visit: Payer: Medicare Other | Admitting: *Deleted

## 2019-03-07 NOTE — Progress Notes (Signed)
Virtual Visit via Audio Note  I connected with patient on 03/10/19 at  8:00 AM EST by audio enabled telemedicine application and verified that I am speaking with the correct person using two identifiers.   THIS ENCOUNTER IS A VIRTUAL VISIT DUE TO COVID-19 - PATIENT WAS NOT SEEN IN THE OFFICE. PATIENT HAS CONSENTED TO VIRTUAL VISIT / TELEMEDICINE VISIT   Location of patient: home  Location of provider: office  I discussed the limitations of evaluation and management by telemedicine and the availability of in person appointments. The patient expressed understanding and agreed to proceed.   Subjective:   [name] Little is a 86 y.o. female who presents for Medicare Annual (Subsequent) preventive examination.  Review of Systems:  Home Safety/Smoke Alarms: Feels safe in home. Smoke alarms in place.  Lives w/ husband in 2 story home. No issues w/ stairs.    Female:   Mammo-  09/13/18    Dexa scan-  04/24/16. Declines today.        Objective:     Vitals: BP 118/65 Comment: pt reported vitals    Advanced Directives 03/10/2019 01/29/2018 10/26/2017 10/21/2017 10/18/2017 08/15/2016 08/15/2016  Does Patient Have a Medical Advance Directive? No No No No No No No  Type of Advance Directive - - - - - - -  Copy of Healthcare Power of Attorney in Chart? - - - - - - -  Would patient like information on creating a medical advance directive? No - Patient declined No - Patient declined No - Patient declined - No - Patient declined No - Patient declined -  Pre-existing out of facility DNR order (yellow form or pink MOST form) - - - - - - -    Tobacco Social History   Tobacco Use  Smoking Status Never Smoker  Smokeless Tobacco Never Used     Counseling given: Not Answered   Clinical Intake: Pain : No/denies pain      Past Medical History:  Diagnosis Date  . Anxiety state, unspecified 09/22/2013  . Arthritis    lumbar stenosis, radiculopathy, OA- L hip  . BCC (basal cell carcinoma),  scalp/neck 09/27/2015  . Blood clotting disorder (HCC)    V5 factor  . Blood dyscrasia    factor 5 deficiency  . Breast cancer ([place]) 2010   RIGHT breast  . Clostridium difficile infection   . Clotting disorder ([place])   . Diabetes mellitus type 2 in obese ([place]) 02/26/2014  . DVT (deep venous thrombosis) ([place])   . Factor V deficiency ([place])   . Hemorrhoids   . Hyperglycemia 02/26/2014  . Hyperlipemia   . Hypertension   . Infiltrating ductal carcinoma of breast ([place])    stage one right   . Low back pain 03/26/2014  . Macular degeneration   . Mitral valve regurgitation 09/01/2016  . Mixed hyperlipidemia 05/18/2015  . Nocturia 02/26/2014  . Paroxysmal supraventricular tachycardia ([place])   . Personal history of colonic polyps    hyperplastic  . Thoracic aortic aneurysm ([place])   . TMJ pain dysfunction syndrome    Past Surgical History:  Procedure Laterality Date  . ABDOMINAL HYSTERECTOMY     1960- ? vaginal hysterectomy, not laparoscopic   . BREAST LUMPECTOMY  2010   right Dr. [name]  . COLONOSCOPY    . EYE SURGERY     bilateral cataracts- /w IOL   . I & D EXTREMITY Right 10/26/2017   Procedure: IRRIGATION AND DEBRIDEMENT RIGHT HAND;  Surgeon: Iran Planas, MD;  Location: [facility];  Service: Orthopedics;  Laterality: Right;  . JOINT REPLACEMENT     R hip replacement   . LAPAROSCOPIC HYSTERECTOMY    . LUMBAR LAMINECTOMY/DECOMPRESSION MICRODISCECTOMY  04/11/2011   Procedure: LUMBAR LAMINECTOMY/DECOMPRESSION MICRODISCECTOMY;  Surgeon: [name], MD;  [facility];  Service: Neurosurgery;  Laterality: N/A;  Lumbar Five-Sacral One Microdiscectomy  . right calf surgery     for cancer  . SPLENECTOMY    . TOTAL HIP ARTHROPLASTY  1999   right  . UPPER GASTROINTESTINAL ENDOSCOPY     Family History  Problem Relation Age of Onset  . Heart disease Mother   . Heart disease Father   . Alzheimer's disease Sister   . Dementia Sister   . Dementia Sister   . Heart disease Other         uncles  . Clotting disorder Maternal Uncle   . Breast cancer Paternal Aunt   . Cancer Paternal Aunt        breast  . Stroke Sister   . Dementia Sister        mean, carotid artery disease  . Colon cancer Neg Hx   . Anesthesia problems Neg Hx   . Hypotension Neg Hx   . Malignant hyperthermia Neg Hx   . Pseudochol deficiency Neg Hx    Social History   Socioeconomic History  . Marital status: Married    Spouse name: Not on file  . Number of children: 4  . Years of education: Not on file  . Highest education level: Not on file  Occupational History  . Occupation: Retired  Tobacco Use  . Smoking status: Never Smoker  . Smokeless tobacco: Never Used  Substance and Sexual Activity  . Alcohol use: No    Alcohol/week: 0.0 standard drinks  . Drug use: No  . Sexual activity: Never    Partners: Male  Other Topics Concern  . Not on file  Social History Narrative   Married 1955   2 sons - '59, '61, 2 daughters '55, '57; 8 grandchildren; 1 great grand   I-ADLs      Social Determinants of Health   Financial Resource Strain:   . Difficulty of Paying Living Expenses: Not on file  Food Insecurity:   . Worried About Charity fundraiser in the Last Year: Not on file  . Ran Out of Food in the Last Year: Not on file  Transportation Needs:   . Lack of Transportation (Medical): Not on file  . Lack of Transportation (Non-Medical): Not on file  Physical Activity:   . Days of Exercise per Week: Not on file  . Minutes of Exercise per Session: Not on file  Stress:   . Feeling of Stress : Not on file  Social Connections:   . Frequency of Communication with Friends and Family: Not on file  . Frequency of Social Gatherings with Friends and Family: Not on file  . Attends Religious Services: Not on file  . Active Member of Clubs or Organizations: Not on file  . Attends Archivist Meetings: Not on file  . Marital Status: Not on file    Outpatient Encounter Medications as of  03/10/2019  Medication Sig  . ALPRAZolam (XANAX) 0.25 MG tablet TAKE ONE TABLET BY MOUTH TWICE DIALY AS NEEDED FOR ANXIETY  . B Complex Vitamins (VITAMIN B COMPLEX PO) Take 1 tablet by mouth daily.   . Blood Glucose Monitoring Suppl (ONE TOUCH ULTRA 2) w/Device KIT See  admin instructions.  . Blood Pressure Monitoring (BLOOD PRESSURE KIT) DEVI 1 Device by Does not apply route daily as needed. Fill per patient insurance preference Dx: Hypertension  . fluconazole (DIFLUCAN) 150 MG tablet Take 1 tablet (150 mg total) by mouth once a week.  . furosemide (LASIX) 20 MG tablet TAKE 1 TABLET (20 MG TOTAL) BY MOUTH DAILY AS NEEDED FOR FLUID OR EDEMA.  Marland Kitchen glucose blood (IGLUCOSE TEST STRIPS) test strip Test Blood Sugars every day as needed  . glucose monitoring kit (FREESTYLE) monitoring kit 1 each by Does not apply route as needed for other.  . hydrochlorothiazide (MICROZIDE) 12.5 MG capsule TAKE 1 CAPSULE BY MOUTH EVERY DAY  . KLOR-CON M20 20 MEQ tablet TAKE 1 TABLET (20 MEQ TOTAL) BY MOUTH DAILY AS NEEDED (LASIX USE).  . Lancets MISC Test Blood Sugars every day as needed  . losartan (COZAAR) 100 MG tablet TAKE 1 TABLET BY MOUTH EVERY DAY  . warfarin (COUMADIN) 5 MG tablet TAKE 1 TABLET (5 MG) BY MOUTH DAILY EXCEPT ON SUN TAKE 0.5 TABLET  . metoprolol succinate (TOPROL-XL) 100 MG 24 hr tablet Take 1 tablet (100 mg total) by mouth daily. Take with or immediately following a meal. (Patient not taking: Reported on 03/10/2019)  . Multiple Vitamins-Minerals (MULTIVITAMIN ADULTS) TABS Take 1 tablet by mouth daily.  . [DISCONTINUED] levofloxacin (LEVAQUIN) 250 MG tablet Take 1 tablet (250 mg total) by mouth every other day. (Patient not taking: Reported on 03/10/2019)   No facility-administered encounter medications on file as of 03/10/2019.    Activities of Daily Living In your present state of health, do you have any difficulty performing the following activities: 03/10/2019  Hearing? N  Vision? N  Difficulty  concentrating or making decisions? N  Walking or climbing stairs? N  Dressing or bathing? N  Doing errands, shopping? N  Preparing Food and eating ? N  Using the Toilet? N  In the past six months, have you accidently leaked urine? Y  Do you have problems with loss of bowel control? N  Managing your Medications? N  Managing your Finances? N  Housekeeping or managing your Housekeeping? N  Some recent data might be hidden    Patient Care Team: Mosie Lukes, MD as PCP - General (Family Medicine) Josue Hector, MD as PCP - Cardiology (Cardiology) Zadie Rhine Clent Demark, MD as Consulting Physician (Ophthalmology)    Assessment:   This is a routine wellness examination for [place]. Physical assessment deferred to PCP.  Exercise Activities and Dietary recommendations Current Exercise Habits: Home exercise routine, Type of exercise: stretching, Time (Minutes): 10, Frequency (Times/Week): 3, Weekly Exercise (Minutes/Week): 30, Intensity: Mild, Exercise limited by: None identified Diet (meal preparation, eat out, water intake, caffeinated beverages, dairy products, fruits and vegetables): 24 hr recall Breakfast: cheerios, coffee Lunch: [employer] chicken sandwich, small coke.  Dinner:  Meat, vegetable, starch  Goals    . Increase physical activity     Increase walking--walk in neighborhood park        Fall Risk Fall Risk  03/10/2019 01/29/2018 10/18/2017 04/17/2016 07/16/2015  Falls in the past year? 0 0 No Yes No  Number falls in past yr: 0 - - 1 -  Injury with Fall? 0 - - No -  Follow up Education provided;Falls prevention discussed - - Education provided;Falls prevention discussed -    Depression Screen PHQ 2/9 Scores 03/10/2019 01/29/2018 10/18/2017 04/17/2016  PHQ - 2 Score 0 0 0 0     Cognitive Function  Ad8 score reviewed for issues:  Issues making decisions:no  Less interest in hobbies / activities:no  Repeats questions, stories (family complaining):no  Trouble using ordinary  gadgets (microwave, computer, phone):no  Forgets the month or year: no  Mismanaging finances: no  Remembering appts:no  Daily problems with thinking and/or memory: no Ad8 score is=0    MMSE - Mini Mental State Exam 01/29/2018 04/17/2016  Orientation to time 5 5  Orientation to Place 5 5  Registration 3 3  Attention/ Calculation 4 5  Recall 1 2  Language- name 2 objects 2 2  Language- repeat 1 1  Language- follow 3 step command 3 3  Language- read & follow direction 1 1  Write a sentence 1 1  Copy design 1 1  Total score 27 29        Immunization History  Administered Date(s) Administered  . Fluad Quad(high Dose 65+) 10/24/2018  . Influenza Split 11/14/2010  . Influenza Whole 11/08/2005, 10/28/2007, 11/23/2008, 11/26/2009  . Influenza, High Dose Seasonal PF 10/19/2015, 10/16/2016, 11/22/2017  . Influenza,inj,Quad PF,6+ Mos 11/26/2012, 09/22/2013, 10/20/2014  . PFIZER SARS-COV-2 Vaccination 02/20/2019  . Pneumococcal Conjugate-13 04/01/2013  . Pneumococcal Polysaccharide-23 10/30/2001, 07/27/2009  . Td 11/27/2001  . Tdap 10/18/2017  . Tetanus 04/01/2013  . Zoster Recombinat (Shingrix) 03/13/2017    Screening Tests Health Maintenance  Topic Date Due  . FOOT EXAM  04/17/2017  . HEMOGLOBIN A1C  05/27/2019  . OPHTHALMOLOGY EXAM  06/17/2019  . TETANUS/TDAP  10/19/2027  . INFLUENZA VACCINE  Completed  . DEXA SCAN  Completed  . PNA vac Low Risk Adult  Completed      Plan:   See you next year!  Continue to eat heart healthy diet (full of fruits, vegetables, whole grains, lean protein, water--limit salt, fat, and sugar intake) and increase physical activity as tolerated.  Continue doing brain stimulating activities (puzzles, reading, adult coloring books, staying active) to keep memory sharp.     I have personally reviewed and noted the following in the patient's chart:   . Medical and social history . Use of alcohol, tobacco or illicit drugs  . Current  medications and supplements . Functional ability and status . Nutritional status . Physical activity . Advanced directives . List of other physicians . Hospitalizations, surgeries, and ER visits in previous 12 months . Vitals . Screenings to include cognitive, depression, and falls . Referrals and appointments  In addition, I have reviewed and discussed with patient certain preventive protocols, quality metrics, and best practice recommendations. A written personalized care plan for preventive services as well as general preventive health recommendations were provided to patient.     Naaman Plummer [place], [place]  03/10/2019

## 2019-03-10 ENCOUNTER — Ambulatory Visit (INDEPENDENT_AMBULATORY_CARE_PROVIDER_SITE_OTHER): Payer: Medicare PPO | Admitting: *Deleted

## 2019-03-10 ENCOUNTER — Encounter: Payer: Self-pay | Admitting: *Deleted

## 2019-03-10 ENCOUNTER — Other Ambulatory Visit: Payer: Self-pay

## 2019-03-10 VITALS — BP 118/65

## 2019-03-10 DIAGNOSIS — Z0000 Encounter for general adult medical examination without abnormal findings: Secondary | ICD-10-CM | POA: Diagnosis not present

## 2019-03-10 NOTE — Patient Instructions (Signed)
See you next year!  Continue to eat heart healthy diet (full of fruits, vegetables, whole grains, lean protein, water--limit salt, fat, and sugar intake) and increase physical activity as tolerated.  Continue doing brain stimulating activities (puzzles, reading, adult coloring books, staying active) to keep memory sharp.    [name] Little , Thank you for taking time to come for your Medicare Wellness Visit. I appreciate your ongoing commitment to your health goals. Please review the following plan we discussed and let me know if I can assist you in the future.   These are the goals we discussed: Goals    . Increase physical activity     Increase walking--walk in neighborhood park        This is a list of the screening recommended for you and due dates:  Health Maintenance  Topic Date Due  . Complete foot exam   04/17/2017  . Hemoglobin A1C  05/27/2019  . Eye exam for diabetics  06/17/2019  . Tetanus Vaccine  10/19/2027  . Flu Shot  Completed  . DEXA scan (bone density measurement)  Completed  . Pneumonia vaccines  Completed    Preventive Care 84 Years and Older, Female Preventive care refers to lifestyle choices and visits with your health care provider that can promote health and wellness. This includes:  A yearly physical exam. This is also called an annual well check.  Regular dental and eye exams.  Immunizations.  Screening for certain conditions.  Healthy lifestyle choices, such as diet and exercise. What can I expect for my preventive care visit? Physical exam Your health care provider will check:  Height and weight. These may be used to calculate body mass index (BMI), which is a measurement that tells if you are at a healthy weight.  Heart rate and blood pressure.  Your skin for abnormal spots. Counseling Your health care provider may ask you questions about:  Alcohol, tobacco, and drug use.  Emotional well-being.  Home and relationship well-being.   Sexual activity.  Eating habits.  History of falls.  Memory and ability to understand (cognition).  Work and work Statistician.  Pregnancy and menstrual history. What immunizations do I need?  Influenza (flu) vaccine  This is recommended every year. Tetanus, diphtheria, and pertussis (Tdap) vaccine  You may need a Td booster every 10 years. Varicella (chickenpox) vaccine  You may need this vaccine if you have not already been vaccinated. Zoster (shingles) vaccine  You may need this after age 59. Pneumococcal conjugate (PCV13) vaccine  One dose is recommended after age 70. Pneumococcal polysaccharide (PPSV23) vaccine  One dose is recommended after age 43. Measles, mumps, and rubella (MMR) vaccine  You may need at least one dose of MMR if you were born in 1957 or later. You may also need a second dose. Meningococcal conjugate (MenACWY) vaccine  You may need this if you have certain conditions. Hepatitis A vaccine  You may need this if you have certain conditions or if you travel or work in places where you may be exposed to hepatitis A. Hepatitis B vaccine  You may need this if you have certain conditions or if you travel or work in places where you may be exposed to hepatitis B. Haemophilus influenzae type b (Hib) vaccine  You may need this if you have certain conditions. You may receive vaccines as individual doses or as more than one vaccine together in one shot (combination vaccines). Talk with your health care provider about the risks and benefits  of combination vaccines. What tests do I need? Blood tests  Lipid and cholesterol levels. These may be checked every 5 years, or more frequently depending on your overall health.  Hepatitis C test.  Hepatitis B test. Screening  Lung cancer screening. You may have this screening every year starting at age 38 if you have a 30-pack-year history of smoking and currently smoke or have quit within the past 15 years.   Colorectal cancer screening. All adults should have this screening starting at age 36 and continuing until age 52. Your health care provider may recommend screening at age 74 if you are at increased risk. You will have tests every 1-10 years, depending on your results and the type of screening test.  Diabetes screening. This is done by checking your blood sugar (glucose) after you have not eaten for a while (fasting). You may have this done every 1-3 years.  Mammogram. This may be done every 1-2 years. Talk with your health care provider about how often you should have regular mammograms.  BRCA-related cancer screening. This may be done if you have a family history of breast, ovarian, tubal, or peritoneal cancers. Other tests  Sexually transmitted disease (STD) testing.  Bone density scan. This is done to screen for osteoporosis. You may have this done starting at age 34. Follow these instructions at home: Eating and drinking  Eat a diet that includes fresh fruits and vegetables, whole grains, lean protein, and low-fat dairy products. Limit your intake of foods with high amounts of sugar, saturated fats, and salt.  Take vitamin and mineral supplements as recommended by your health care provider.  Do not drink alcohol if your health care provider tells you not to drink.  If you drink alcohol: ? Limit how much you have to 0-1 drink a day. ? Be aware of how much alcohol is in your drink. In the [place] one drink equals one 12 oz bottle of beer (355 mL), one 5 oz glass of wine (148 mL), or one 1 oz glass of hard liquor (44 mL). Lifestyle  Take daily care of your teeth and gums.  Stay active. Exercise for at least 30 minutes on 5 or more days each week.  Do not use any products that contain nicotine or tobacco, such as cigarettes, e-cigarettes, and chewing tobacco. If you need help quitting, ask your health care provider.  If you are sexually active, practice safe sex. Use a condom or other  form of protection in order to prevent STIs (sexually transmitted infections).  Talk with your health care provider about taking a low-dose aspirin or statin. What's next?  Go to your health care provider once a year for a well check visit.  Ask your health care provider how often you should have your eyes and teeth checked.  Stay up to date on all vaccines. This information is not intended to replace advice given to you by your health care provider. Make sure you discuss any questions you have with your health care provider. Document Revised: 01/10/2018 Document Reviewed: 01/10/2018 Elsevier Patient Education  2020 [employer].

## 2019-03-13 ENCOUNTER — Other Ambulatory Visit: Payer: Self-pay | Admitting: Family Medicine

## 2019-03-13 ENCOUNTER — Ambulatory Visit: Payer: Medicare PPO | Attending: Internal Medicine

## 2019-03-13 DIAGNOSIS — Z23 Encounter for immunization: Secondary | ICD-10-CM | POA: Insufficient documentation

## 2019-03-13 NOTE — Progress Notes (Signed)
   Covid-19 Vaccination Clinic  Name:  [name] Little    MRN: [id number] DOB: 10-20-1933  03/13/2019  [name] was observed post Covid-19 immunization for 15 minutes without incidence. She was provided with Vaccine Information Sheet and instruction to access the V-Safe system.   [name] was instructed to call 911 with any severe reactions post vaccine: Marland Kitchen Difficulty breathing  . Swelling of your face and throat  . A fast heartbeat  . A bad rash all over your body  . Dizziness and weakness    Immunizations Administered    Name Date Dose VIS Date Route   Pfizer COVID-19 Vaccine 03/13/2019  1:56 PM 0.3 mL 01/10/2019 Intramuscular   Manufacturer: [place]   Lot: [id number]   [place]: [id number]

## 2019-03-17 ENCOUNTER — Other Ambulatory Visit: Payer: Self-pay | Admitting: Family Medicine

## 2019-03-18 ENCOUNTER — Other Ambulatory Visit: Payer: Self-pay

## 2019-03-18 ENCOUNTER — Ambulatory Visit (INDEPENDENT_AMBULATORY_CARE_PROVIDER_SITE_OTHER): Payer: Medicare PPO | Admitting: General Practice

## 2019-03-18 DIAGNOSIS — Z86718 Personal history of other venous thrombosis and embolism: Secondary | ICD-10-CM

## 2019-03-18 DIAGNOSIS — Z7901 Long term (current) use of anticoagulants: Secondary | ICD-10-CM | POA: Diagnosis not present

## 2019-03-18 LAB — POCT INR: INR: 2.8 (ref 2.0–3.0)

## 2019-03-18 NOTE — Progress Notes (Signed)
Medical screening examination/treatment/procedure(s) were performed by non-physician practitioner and as supervising physician I was immediately available for consultation/collaboration. I agree with above. Gagan Dillion, MD   

## 2019-03-18 NOTE — Patient Instructions (Addendum)
Pre visit review using our clinic review tool, if applicable. No additional management support is needed unless otherwise documented below in the visit note.  Take 1/2 tablet today and then continue to take 1 tablet daily with 1/2 tablet on Mondays/Wednesdays and Fridays. Re-check 6 weeks.

## 2019-03-25 DIAGNOSIS — C4442 Squamous cell carcinoma of skin of scalp and neck: Secondary | ICD-10-CM | POA: Diagnosis not present

## 2019-03-25 DIAGNOSIS — L570 Actinic keratosis: Secondary | ICD-10-CM | POA: Diagnosis not present

## 2019-03-25 DIAGNOSIS — D485 Neoplasm of uncertain behavior of skin: Secondary | ICD-10-CM | POA: Diagnosis not present

## 2019-03-31 ENCOUNTER — Ambulatory Visit: Payer: Medicare Other | Admitting: Family Medicine

## 2019-04-08 DIAGNOSIS — C4442 Squamous cell carcinoma of skin of scalp and neck: Secondary | ICD-10-CM | POA: Diagnosis not present

## 2019-04-24 ENCOUNTER — Ambulatory Visit (INDEPENDENT_AMBULATORY_CARE_PROVIDER_SITE_OTHER): Payer: Medicare PPO | Admitting: General Practice

## 2019-04-24 ENCOUNTER — Other Ambulatory Visit: Payer: Self-pay

## 2019-04-24 DIAGNOSIS — Z7901 Long term (current) use of anticoagulants: Secondary | ICD-10-CM | POA: Diagnosis not present

## 2019-04-24 DIAGNOSIS — Z86718 Personal history of other venous thrombosis and embolism: Secondary | ICD-10-CM

## 2019-04-24 LAB — POCT INR: INR: 3.6 — AB (ref 2.0–3.0)

## 2019-04-24 NOTE — Progress Notes (Signed)
Medical screening examination/treatment/procedure(s) were performed by non-physician practitioner and as supervising physician I was immediately available for consultation/collaboration. I agree with above. Kerry Chisolm, MD   

## 2019-04-24 NOTE — Patient Instructions (Signed)
Pre visit review using our clinic review tool, if applicable. No additional management support is needed unless otherwise documented below in the visit note.  Hold coumadin today (3/25) and change dosage and take 1/2 tablet daily except 1 tablet on Sun Tues and Thursdays.  Re-check in 3 to 4 weeks.

## 2019-05-22 ENCOUNTER — Ambulatory Visit (INDEPENDENT_AMBULATORY_CARE_PROVIDER_SITE_OTHER): Payer: Medicare PPO | Admitting: General Practice

## 2019-05-22 ENCOUNTER — Other Ambulatory Visit: Payer: Self-pay

## 2019-05-22 DIAGNOSIS — Z7901 Long term (current) use of anticoagulants: Secondary | ICD-10-CM | POA: Diagnosis not present

## 2019-05-22 LAB — POCT INR: INR: 2.8 (ref 2.0–3.0)

## 2019-05-22 NOTE — Patient Instructions (Signed)
Pre visit review using our clinic review tool, if applicable. No additional management support is needed unless otherwise documented below in the visit note.  Continue to take 1/2 tablet daily except 1 tablet on Sun Tues and Thursdays.  Re-check in 6 weeks.

## 2019-05-26 ENCOUNTER — Encounter: Payer: Self-pay | Admitting: Family Medicine

## 2019-05-27 ENCOUNTER — Encounter: Payer: Self-pay | Admitting: Family Medicine

## 2019-05-27 ENCOUNTER — Other Ambulatory Visit: Payer: Self-pay

## 2019-05-27 ENCOUNTER — Ambulatory Visit: Payer: Medicare PPO | Admitting: Family Medicine

## 2019-05-27 VITALS — BP 132/90 | HR 89 | Temp 97.9°F | Resp 18 | Ht 63.0 in | Wt 171.4 lb

## 2019-05-27 DIAGNOSIS — N200 Calculus of kidney: Secondary | ICD-10-CM | POA: Diagnosis not present

## 2019-05-27 DIAGNOSIS — M549 Dorsalgia, unspecified: Secondary | ICD-10-CM | POA: Insufficient documentation

## 2019-05-27 DIAGNOSIS — R5383 Other fatigue: Secondary | ICD-10-CM | POA: Insufficient documentation

## 2019-05-27 LAB — POC URINALSYSI DIPSTICK (AUTOMATED)
Bilirubin, UA: NEGATIVE
Blood, UA: NEGATIVE
Glucose, UA: NEGATIVE
Ketones, UA: NEGATIVE
Leukocytes, UA: NEGATIVE
Nitrite, UA: NEGATIVE
Protein, UA: POSITIVE — AB
Spec Grav, UA: 1.02 (ref 1.010–1.025)
Urobilinogen, UA: 0.2 E.U./dL
pH, UA: 6 (ref 5.0–8.0)

## 2019-05-27 LAB — CBC WITH DIFFERENTIAL/PLATELET
Basophils Absolute: 0.1 10*3/uL (ref 0.0–0.1)
Basophils Relative: 1 % (ref 0.0–3.0)
Eosinophils Absolute: 0.1 10*3/uL (ref 0.0–0.7)
Eosinophils Relative: 1.2 % (ref 0.0–5.0)
HCT: 44.5 % (ref 36.0–46.0)
Hemoglobin: 14.5 g/dL (ref 12.0–15.0)
Lymphocytes Relative: 49.1 % — ABNORMAL HIGH (ref 12.0–46.0)
Lymphs Abs: 4.6 10*3/uL — ABNORMAL HIGH (ref 0.7–4.0)
MCHC: 32.5 g/dL (ref 30.0–36.0)
MCV: 92.9 fl (ref 78.0–100.0)
Monocytes Absolute: 1.2 10*3/uL — ABNORMAL HIGH (ref 0.1–1.0)
Monocytes Relative: 12.9 % — ABNORMAL HIGH (ref 3.0–12.0)
Neutro Abs: 3.4 10*3/uL (ref 1.4–7.7)
Neutrophils Relative %: 35.8 % — ABNORMAL LOW (ref 43.0–77.0)
Platelets: 339 10*3/uL (ref 150.0–400.0)
RBC: 4.79 Mil/uL (ref 3.87–5.11)
RDW: 14.6 % (ref 11.5–15.5)
WBC: 9.4 10*3/uL (ref 4.0–10.5)

## 2019-05-27 LAB — TSH: TSH: 3.8 u[IU]/mL (ref 0.35–4.50)

## 2019-05-27 LAB — COMPREHENSIVE METABOLIC PANEL
ALT: 12 U/L (ref 0–35)
AST: 17 U/L (ref 0–37)
Albumin: 4.2 g/dL (ref 3.5–5.2)
Alkaline Phosphatase: 55 U/L (ref 39–117)
BUN: 28 mg/dL — ABNORMAL HIGH (ref 6–23)
CO2: 31 mEq/L (ref 19–32)
Calcium: 9.9 mg/dL (ref 8.4–10.5)
Chloride: 100 mEq/L (ref 96–112)
Creatinine, Ser: 0.91 mg/dL (ref 0.40–1.20)
GFR: 58.97 mL/min — ABNORMAL LOW (ref >60.00)
Glucose, Bld: 108 mg/dL — ABNORMAL HIGH (ref 70–99)
Potassium: 4.7 mEq/L (ref 3.5–5.1)
Sodium: 138 mEq/L (ref 135–145)
Total Bilirubin: 0.7 mg/dL (ref 0.2–1.2)
Total Protein: 7.5 g/dL (ref 6.0–8.3)

## 2019-05-27 NOTE — Assessment & Plan Note (Signed)
Pain resolved ? Passed kidney stone

## 2019-05-27 NOTE — Progress Notes (Signed)
Patient ID: [name] Little, female    DOB: May 29, 1933  Age: 86 y.o. MRN: [id number]    Subjective:  Subjective  HPI [name] Little presents for low back pain   On the 23 she had episode of n/v-- and she went to urinate and passed blood No pain--- + weakness  Pt with a hx of kidney stones  Review of Systems  Constitutional: Negative for appetite change, diaphoresis, fatigue and unexpected weight change.  Eyes: Negative for pain, redness and visual disturbance.  Respiratory: Negative for cough, chest tightness, shortness of breath and wheezing.   Cardiovascular: Negative for chest pain, palpitations and leg swelling.  Endocrine: Negative for cold intolerance, heat intolerance, polydipsia, polyphagia and polyuria.  Genitourinary: Positive for hematuria. Negative for difficulty urinating, dysuria, flank pain, frequency, genital sores, menstrual problem, vaginal bleeding, vaginal discharge and vaginal pain.  Neurological: Positive for weakness. Negative for dizziness, light-headedness, numbness and headaches.    History Past Medical History:  Diagnosis Date  . Anxiety state, unspecified 09/22/2013  . Arthritis    lumbar stenosis, radiculopathy, OA- L hip  . BCC (basal cell carcinoma), scalp/neck 09/27/2015  . Blood clotting disorder (HCC)    V5 factor  . Blood dyscrasia    factor 5 deficiency  . Breast cancer ([place]) 2010   RIGHT breast  . Clostridium difficile infection   . Clotting disorder ([place])   . Diabetes mellitus type 2 in obese ([place]) 02/26/2014  . DVT (deep venous thrombosis) ([place])   . Factor V deficiency ([place])   . Hemorrhoids   . Hyperglycemia 02/26/2014  . Hyperlipemia   . Hypertension   . Infiltrating ductal carcinoma of breast ([place])    stage one right   . Low back pain 03/26/2014  . Macular degeneration   . Mitral valve regurgitation 09/01/2016  . Mixed hyperlipidemia 05/18/2015  . Nocturia 02/26/2014  . Paroxysmal supraventricular tachycardia ([place])   . Personal history  of colonic polyps    hyperplastic  . Thoracic aortic aneurysm ([place])   . TMJ pain dysfunction syndrome     She has a past surgical history that includes Total hip arthroplasty (1999); Laparoscopic hysterectomy; Splenectomy; right calf surgery; Colonoscopy; Upper gastrointestinal endoscopy; Joint replacement; Abdominal hysterectomy; Eye surgery; Lumbar laminectomy/decompression microdiscectomy (04/11/2011); Breast lumpectomy (2010); and I & D extremity (Right, 10/26/2017).   Her family history includes Alzheimer's disease in her sister; Breast cancer in her paternal aunt; Cancer in her paternal aunt; Clotting disorder in her maternal uncle; Dementia in her sister, sister, and sister; Heart disease in her father, mother, and another family member; Stroke in her sister.She reports that she has never smoked. She has never used smokeless tobacco. She reports that she does not drink alcohol or use drugs.  Current Outpatient Medications on File Prior to Visit  Medication Sig Dispense Refill  . ALPRAZolam (XANAX) 0.25 MG tablet TAKE ONE TABLET BY MOUTH TWICE DIALY AS NEEDED FOR ANXIETY 20 tablet 1  . B Complex Vitamins (VITAMIN B COMPLEX PO) Take 1 tablet by mouth daily.     . Blood Glucose Monitoring Suppl (ONE TOUCH ULTRA 2) w/Device KIT See admin instructions.  0  . Blood Pressure Monitoring (BLOOD PRESSURE KIT) DEVI 1 Device by Does not apply route daily as needed. Fill per patient insurance preference Dx: Hypertension 1 Device 0  . fluconazole (DIFLUCAN) 150 MG tablet Take 1 tablet (150 mg total) by mouth once a week. 2 tablet 0  . furosemide (LASIX) 20 MG tablet TAKE 1 TABLET (  20 MG TOTAL) BY MOUTH DAILY AS NEEDED FOR FLUID OR EDEMA. 30 tablet 0  . glucose blood (IGLUCOSE TEST STRIPS) test strip Test Blood Sugars every day as needed 100 each 5  . glucose monitoring kit (FREESTYLE) monitoring kit 1 each by Does not apply route as needed for other. 1 each 0  . hydrochlorothiazide (MICROZIDE) 12.5 MG  capsule TAKE 1 CAPSULE BY MOUTH EVERY DAY 30 capsule 2  . KLOR-CON M20 20 MEQ tablet TAKE 1 TABLET (20 MEQ TOTAL) BY MOUTH DAILY AS NEEDED (LASIX USE). 30 tablet 0  . Lancets MISC Test Blood Sugars every day as needed 100 each 5  . losartan (COZAAR) 100 MG tablet TAKE 1 TABLET BY MOUTH EVERY DAY 90 tablet 0  . metoprolol succinate (TOPROL-XL) 100 MG 24 hr tablet Take 1 tablet (100 mg total) by mouth daily. Take with or immediately following a meal. 90 tablet 1  . Multiple Vitamins-Minerals (MULTIVITAMIN ADULTS) TABS Take 1 tablet by mouth daily.    Marland Kitchen warfarin (COUMADIN) 5 MG tablet TAKE 1 TABLET (5 MG) BY MOUTH DAILY EXCEPT ON SUN TAKE 0.5 TABLET 84 tablet 1   No current facility-administered medications on file prior to visit.     Objective:  Objective  Physical Exam Vitals and nursing note reviewed.  Constitutional:      Appearance: She is well-developed.  HENT:     Head: Normocephalic and atraumatic.  Eyes:     Conjunctiva/sclera: Conjunctivae normal.  Neck:     Thyroid: No thyromegaly.     Vascular: No carotid bruit or JVD.  Cardiovascular:     Rate and Rhythm: Normal rate and regular rhythm.     Heart sounds: Normal heart sounds. No murmur.  Pulmonary:     Effort: Pulmonary effort is normal. No respiratory distress.     Breath sounds: Normal breath sounds. No wheezing or rales.  Chest:     Chest wall: No tenderness.  Musculoskeletal:     Cervical back: Normal range of motion and neck supple.  Neurological:     Mental Status: She is alert and oriented to person, place, and time.    BP 132/90 (BP Location: Right Arm, Patient Position: Sitting, Cuff Size: Normal)   Pulse 89   Temp 97.9 F (36.6 C) (Temporal)   Resp 18   Ht _0  (1.6 m)   Wt 171 lb 6.4 oz (77.7 kg)   SpO2 96%   BMI 30.36 kg/m  Wt Readings from Last 3 Encounters:  05/27/19 171 lb 6.4 oz (77.7 kg)  11/26/18 176 lb (79.8 kg)  10/30/18 177 lb (80.3 kg)     Lab Results  Component Value Date   WBC  9.0 11/26/2018   HGB 14.4 11/26/2018   HCT 44.0 11/26/2018   PLT 331.0 11/26/2018   GLUCOSE 117 (H) 11/26/2018   CHOL 218 (H) 11/26/2018   TRIG 337.0 (H) 11/26/2018   HDL 33.00 (L) 11/26/2018   LDLDIRECT 115.0 11/26/2018   LDLCALC 134 (H) 03/05/2017   ALT 11 11/26/2018   AST 18 11/26/2018   NA 140 11/26/2018   K 4.4 11/26/2018   CL 102 11/26/2018   CREATININE 0.81 11/26/2018   BUN 24 (H) 11/26/2018   CO2 31 11/26/2018   TSH 3.18 11/26/2018   INR 2.8 05/22/2019   HGBA1C 6.8 (H) 11/26/2018   MICROALBUR 1.5 10/20/2014    MM 3D SCREEN BREAST BILATERAL  Result Date: 09/13/2018 CLINICAL DATA:  Screening. EXAM: DIGITAL SCREENING BILATERAL MAMMOGRAM WITH TOMO  AND CAD COMPARISON:  Previous exam(s). ACR Breast Density Category b: There are scattered areas of fibroglandular density. FINDINGS: There are no findings suspicious for malignancy. Stable post lumpectomy changes on the right. Images were processed with CAD. IMPRESSION: No mammographic evidence of malignancy. A result letter of this screening mammogram will be mailed directly to the patient. RECOMMENDATION: Screening mammogram in one year. (Code:SM-B-01Y) BI-RADS CATEGORY  2: Benign. Electronically Signed   By: Lajean Manes M.D.   On: 09/13/2018 16:39     Assessment & Plan:  Plan  I am having Lexy Meininger. Bochicchio maintain her B Complex Vitamins (VITAMIN B COMPLEX PO), glucose monitoring kit, Lancets, glucose blood, Multivitamin Adults, ONE TOUCH ULTRA 2, ALPRAZolam, Blood Pressure Kit, metoprolol succinate, Klor-Con M20, furosemide, fluconazole, warfarin, hydrochlorothiazide, and losartan.  No orders of the defined types were placed in this encounter.   Problem List Items Addressed This Visit      Unprioritized   Acute bilateral back pain - Primary    Pain resolved ? Passed kidney stone      Relevant Orders   POCT Urinalysis Dipstick (Automated) (Completed)   Urine Culture   Kidney stones    May have passed one Pt given  strainer incase the pain comes back  Pt requesting urology referral --- she used to go [facility] but would like to see one in [facility] normal  Culture pending      Relevant Orders   Ambulatory referral to Urology   Other fatigue   Relevant Orders   CBC with Differential/Platelet   TSH   Comprehensive metabolic panel      Follow-up: Return if symptoms worsen or fail to improve.  Ann Held, DO

## 2019-05-27 NOTE — Patient Instructions (Signed)
Kidney Stones  Kidney stones are solid, rock-like deposits that form inside of the kidneys. The kidneys are a pair of organs that make urine. A kidney stone may form in a kidney and move into other parts of the urinary tract, including the tubes that connect the kidneys to the bladder (ureters), the bladder, and the tube that carries urine out of the body (urethra). As the stone moves through these areas, it can cause intense pain and block the flow of urine. Kidney stones are created when high levels of certain minerals are found in the urine. The stones are usually passed out of the body through urination, but in some cases, medical treatment may be needed to remove them. What are the causes? Kidney stones may be caused by:  A condition in which certain glands produce too much parathyroid hormone (primary hyperparathyroidism), which causes too much calcium buildup in the blood.  A buildup of uric acid crystals in the bladder (hyperuricosuria). Uric acid is a chemical that the body produces when you eat certain foods. It usually exits the body in the urine.  Narrowing (stricture) of one or both of the ureters.  A kidney blockage that is present at birth (congenital obstruction).  Past surgery on the kidney or the ureters, such as gastric bypass surgery. What increases the risk? The following factors may make you more likely to develop this condition:  Having had a kidney stone in the past.  Having a family history of kidney stones.  Not drinking enough water.  Eating a diet that is high in protein, salt (sodium), or sugar.  Being overweight or obese. What are the signs or symptoms? Symptoms of a kidney stone may include:  Pain in the side of the abdomen, right below the ribs (flank pain). Pain usually spreads (radiates) to the groin.  Needing to urinate frequently or urgently.  Painful urination.  Blood in the urine (hematuria).  Nausea.  Vomiting.  Fever and chills. How  is this diagnosed? This condition may be diagnosed based on:  Your symptoms and medical history.  A physical exam.  Blood tests.  Urine tests. These may be done before and after the stone passes out of your body through urination.  Imaging tests, such as a CT scan, abdominal X-ray, or ultrasound.  A procedure to examine the inside of the bladder (cystoscopy). How is this treated? Treatment for kidney stones depends on the size, location, and makeup of the stones. Kidney stones will often pass out of the body through urination. You may need to:  Increase your fluid intake to help pass the stone. In some cases, you may be given fluids through an IV and may need to be monitored at the hospital.  Take medicine for pain.  Make changes in your diet to help prevent kidney stones from coming back. Sometimes, medical procedures are needed to remove a kidney stone. This may involve:  A procedure to break up kidney stones using: ? A focused beam of light (laser therapy). ? Shock waves (extracorporeal shock wave lithotripsy).  Surgery to remove kidney stones. This may be needed if you have severe pain or have stones that block your urinary tract. Follow these instructions at home: Medicines  Take over-the-counter and prescription medicines only as told by your health care provider.  Ask your health care provider if the medicine prescribed to you requires you to avoid driving or using heavy machinery. Eating and drinking  Drink enough fluid to keep your urine pale yellow.   You may be instructed to drink at least 8-10 glasses of water each day. This will help you pass the kidney stone.  If directed, change your diet. This may include: ? Limiting how much sodium you eat. ? Eating more fruits and vegetables. ? Limiting how much animal protein--such as red meat, poultry, fish, and eggs--you eat.  Follow instructions from your health care provider about eating or drinking  restrictions. General instructions  Collect urine samples as told by your health care provider. You may need to collect a urine sample: ? 24 hours after you pass the stone. ? 8-12 weeks after passing the kidney stone, and every 6-12 months after that.  Strain your urine every time you urinate, for as long as directed. Use the strainer that your health care provider recommends.  Do not throw out the kidney stone after passing it. Keep the stone so it can be tested by your health care provider. Testing the makeup of your kidney stone may help prevent you from getting kidney stones in the future.  Keep all follow-up visits as told by your health care provider. This is important. You may need follow-up X-rays or ultrasounds to make sure that your stone has passed. How is this prevented? To prevent another kidney stone:  Drink enough fluid to keep your urine pale yellow. This is the best way to prevent kidney stones.  Eat a healthy diet and follow recommendations from your health care provider about foods to avoid. You may be instructed to eat a low-protein diet. Recommendations vary depending on the type of kidney stone that you have.  Maintain a healthy weight. Where to find more information  National Kidney Foundation (NKF): www.kidney.org  [employer] (UCF): www.urologyhealth.org Contact a health care provider if:  You have pain that gets worse or does not get better with medicine. Get help right away if:  You have a fever or chills.  You develop severe pain.  You develop new abdominal pain.  You faint.  You are unable to urinate. Summary  Kidney stones are solid, rock-like deposits that form inside of the kidneys.  Kidney stones can cause nausea, vomiting, blood in the urine, abdominal pain, and the urge to urinate frequently.  Treatment for kidney stones depends on the size, location, and makeup of the stones. Kidney stones will often pass out of the body  through urination.  Kidney stones can be prevented by drinking enough fluids, eating a healthy diet, and maintaining a healthy weight. This information is not intended to replace advice given to you by your health care provider. Make sure you discuss any questions you have with your health care provider. Document Revised: 06/04/2018 Document Reviewed: 06/04/2018 Elsevier Patient Education  2020 [employer]  

## 2019-05-27 NOTE — Assessment & Plan Note (Addendum)
May have passed one Pt given strainer incase the pain comes back  Pt requesting urology referral --- she used to go to [facility] but would like to see one in [facility]  Culture pending

## 2019-05-29 LAB — URINE CULTURE
MICRO NUMBER:: 10410703
SPECIMEN QUALITY:: ADEQUATE

## 2019-06-02 ENCOUNTER — Other Ambulatory Visit: Payer: Self-pay | Admitting: Family Medicine

## 2019-06-02 MED ORDER — AMOXICILLIN-POT CLAVULANATE 875-125 MG PO TABS: 1.0000 | ORAL_TABLET | Freq: Two times a day (BID) | ORAL | 0 refills | Status: DC

## 2019-06-06 ENCOUNTER — Other Ambulatory Visit: Payer: Self-pay | Admitting: Family Medicine

## 2019-06-11 DIAGNOSIS — N132 Hydronephrosis with renal and ureteral calculous obstruction: Secondary | ICD-10-CM | POA: Diagnosis not present

## 2019-06-11 DIAGNOSIS — N200 Calculus of kidney: Secondary | ICD-10-CM | POA: Diagnosis not present

## 2019-06-12 ENCOUNTER — Other Ambulatory Visit: Payer: Self-pay | Admitting: Family Medicine

## 2019-06-17 DIAGNOSIS — N200 Calculus of kidney: Secondary | ICD-10-CM | POA: Diagnosis not present

## 2019-06-17 DIAGNOSIS — N390 Urinary tract infection, site not specified: Secondary | ICD-10-CM | POA: Diagnosis not present

## 2019-07-02 DIAGNOSIS — N200 Calculus of kidney: Secondary | ICD-10-CM | POA: Diagnosis not present

## 2019-07-03 ENCOUNTER — Other Ambulatory Visit: Payer: Self-pay

## 2019-07-03 ENCOUNTER — Ambulatory Visit (INDEPENDENT_AMBULATORY_CARE_PROVIDER_SITE_OTHER): Payer: Medicare PPO | Admitting: General Practice

## 2019-07-03 DIAGNOSIS — Z7901 Long term (current) use of anticoagulants: Secondary | ICD-10-CM

## 2019-07-03 DIAGNOSIS — Z86718 Personal history of other venous thrombosis and embolism: Secondary | ICD-10-CM

## 2019-07-03 LAB — POCT INR: INR: 2 (ref 2.0–3.0)

## 2019-07-03 NOTE — Patient Instructions (Signed)
Pre visit review using our clinic review tool, if applicable. No additional management support is needed unless otherwise documented below in the visit note.  Take 1 1/2 tablets today (6/3) and then continue to take 1/2 tablet daily except 1 tablet on Sun Tues and Thursdays.  Re-check in 6 weeks.

## 2019-07-18 DIAGNOSIS — Z961 Presence of intraocular lens: Secondary | ICD-10-CM | POA: Diagnosis not present

## 2019-07-18 DIAGNOSIS — H353131 Nonexudative age-related macular degeneration, bilateral, early dry stage: Secondary | ICD-10-CM | POA: Diagnosis not present

## 2019-07-18 DIAGNOSIS — H524 Presbyopia: Secondary | ICD-10-CM | POA: Diagnosis not present

## 2019-07-26 ENCOUNTER — Other Ambulatory Visit: Payer: Self-pay | Admitting: Family Medicine

## 2019-07-31 DIAGNOSIS — L570 Actinic keratosis: Secondary | ICD-10-CM | POA: Diagnosis not present

## 2019-07-31 DIAGNOSIS — L905 Scar conditions and fibrosis of skin: Secondary | ICD-10-CM | POA: Diagnosis not present

## 2019-07-31 DIAGNOSIS — D1801 Hemangioma of skin and subcutaneous tissue: Secondary | ICD-10-CM | POA: Diagnosis not present

## 2019-07-31 DIAGNOSIS — L814 Other melanin hyperpigmentation: Secondary | ICD-10-CM | POA: Diagnosis not present

## 2019-07-31 DIAGNOSIS — D229 Melanocytic nevi, unspecified: Secondary | ICD-10-CM | POA: Diagnosis not present

## 2019-07-31 DIAGNOSIS — L821 Other seborrheic keratosis: Secondary | ICD-10-CM | POA: Diagnosis not present

## 2019-07-31 DIAGNOSIS — Z85828 Personal history of other malignant neoplasm of skin: Secondary | ICD-10-CM | POA: Diagnosis not present

## 2019-07-31 DIAGNOSIS — L819 Disorder of pigmentation, unspecified: Secondary | ICD-10-CM | POA: Diagnosis not present

## 2019-07-31 DIAGNOSIS — D485 Neoplasm of uncertain behavior of skin: Secondary | ICD-10-CM | POA: Diagnosis not present

## 2019-08-12 ENCOUNTER — Other Ambulatory Visit: Payer: Self-pay | Admitting: Family Medicine

## 2019-08-12 DIAGNOSIS — Z1231 Encounter for screening mammogram for malignant neoplasm of breast: Secondary | ICD-10-CM

## 2019-08-14 ENCOUNTER — Ambulatory Visit (INDEPENDENT_AMBULATORY_CARE_PROVIDER_SITE_OTHER): Payer: Medicare PPO | Admitting: General Practice

## 2019-08-14 ENCOUNTER — Other Ambulatory Visit: Payer: Self-pay

## 2019-08-14 DIAGNOSIS — Z7901 Long term (current) use of anticoagulants: Secondary | ICD-10-CM | POA: Diagnosis not present

## 2019-08-14 LAB — POCT INR: INR: 1.9 — AB (ref 2.0–3.0)

## 2019-08-14 NOTE — Progress Notes (Signed)
Medical screening examination/treatment/procedure(s) were performed by non-physician practitioner and as supervising physician I was immediately available for consultation/collaboration. I agree with above. Giavanni Zeitlin, MD   

## 2019-08-14 NOTE — Patient Instructions (Addendum)
Pre visit review using our clinic review tool, if applicable. No additional management support is needed unless otherwise documented below in the visit note.  Take 1 1/2 tablets today (6/3) and then continue to take 1/2 tablet daily except 1 tablet on Sun Tues and Thursdays.  Re-check in 3 weeks.

## 2019-09-01 DIAGNOSIS — L820 Inflamed seborrheic keratosis: Secondary | ICD-10-CM | POA: Diagnosis not present

## 2019-09-01 DIAGNOSIS — L989 Disorder of the skin and subcutaneous tissue, unspecified: Secondary | ICD-10-CM | POA: Diagnosis not present

## 2019-09-01 DIAGNOSIS — D485 Neoplasm of uncertain behavior of skin: Secondary | ICD-10-CM | POA: Diagnosis not present

## 2019-09-02 ENCOUNTER — Telehealth: Payer: Self-pay

## 2019-09-02 DIAGNOSIS — R319 Hematuria, unspecified: Secondary | ICD-10-CM

## 2019-09-02 NOTE — Telephone Encounter (Signed)
Looks likes she has history of UTIs.

## 2019-09-02 NOTE — Telephone Encounter (Signed)
Please get her in for a urinalysis and culture for hematuria. If she is in pain can treat empirically with an antibiotic. Please check with her

## 2019-09-02 NOTE — Telephone Encounter (Signed)
Caller states that she needs to come in for a urine test. She has blood in her urine. Declined Triage. Telephone: [phone]

## 2019-09-03 ENCOUNTER — Other Ambulatory Visit (INDEPENDENT_AMBULATORY_CARE_PROVIDER_SITE_OTHER): Payer: Medicare PPO

## 2019-09-03 ENCOUNTER — Other Ambulatory Visit: Payer: Self-pay

## 2019-09-03 DIAGNOSIS — R319 Hematuria, unspecified: Secondary | ICD-10-CM

## 2019-09-03 LAB — POC URINALSYSI DIPSTICK (AUTOMATED)
Bilirubin, UA: NEGATIVE
Blood, UA: NEGATIVE
Glucose, UA: NEGATIVE
Ketones, UA: NEGATIVE
Leukocytes, UA: NEGATIVE
Nitrite, UA: POSITIVE
Protein, UA: NEGATIVE
Spec Grav, UA: 1.025 (ref 1.010–1.025)
Urobilinogen, UA: 0.2 E.U./dL
pH, UA: 5.5 (ref 5.0–8.0)

## 2019-09-03 NOTE — Telephone Encounter (Signed)
Patient will be today to give urine.

## 2019-09-04 ENCOUNTER — Ambulatory Visit (INDEPENDENT_AMBULATORY_CARE_PROVIDER_SITE_OTHER): Payer: Medicare PPO | Admitting: General Practice

## 2019-09-04 ENCOUNTER — Ambulatory Visit: Payer: Medicare PPO

## 2019-09-04 DIAGNOSIS — Z7901 Long term (current) use of anticoagulants: Secondary | ICD-10-CM

## 2019-09-04 DIAGNOSIS — Z86718 Personal history of other venous thrombosis and embolism: Secondary | ICD-10-CM

## 2019-09-04 LAB — POCT INR: INR: 1.9 — AB (ref 2.0–3.0)

## 2019-09-04 NOTE — Patient Instructions (Signed)
Pre visit review using our clinic review tool, if applicable. No additional management support is needed unless otherwise documented below in the visit note.  Take 1 1/2 tablets today (8/5) and then change dosage and take 1 tablet daily except 1/2 tablet on Mon Wed and Fridays.  Re-check in 4 weeks.

## 2019-09-04 NOTE — Progress Notes (Signed)
Medical screening examination/treatment/procedure(s) were performed by non-physician practitioner and as supervising physician I was immediately available for consultation/collaboration. I agree with above. Soleil Mas, MD   

## 2019-09-05 LAB — URINE CULTURE
MICRO NUMBER:: 10786538
SPECIMEN QUALITY:: ADEQUATE

## 2019-09-06 ENCOUNTER — Other Ambulatory Visit: Payer: Self-pay | Admitting: Family Medicine

## 2019-09-06 MED ORDER — FLUCONAZOLE 150 MG PO TABS: 150.0000 mg | ORAL_TABLET | ORAL | 0 refills | Status: DC

## 2019-09-06 MED ORDER — AMOXICILLIN-POT CLAVULANATE 875-125 MG PO TABS: 1.0000 | ORAL_TABLET | Freq: Two times a day (BID) | ORAL | 0 refills | Status: DC

## 2019-09-08 ENCOUNTER — Other Ambulatory Visit: Payer: Self-pay | Admitting: Family Medicine

## 2019-09-09 ENCOUNTER — Other Ambulatory Visit: Payer: Self-pay | Admitting: Family Medicine

## 2019-09-15 DIAGNOSIS — N1330 Unspecified hydronephrosis: Secondary | ICD-10-CM | POA: Diagnosis not present

## 2019-09-15 DIAGNOSIS — I700 Atherosclerosis of aorta: Secondary | ICD-10-CM | POA: Diagnosis not present

## 2019-09-15 DIAGNOSIS — N202 Calculus of kidney with calculus of ureter: Secondary | ICD-10-CM | POA: Diagnosis not present

## 2019-09-15 DIAGNOSIS — K8020 Calculus of gallbladder without cholecystitis without obstruction: Secondary | ICD-10-CM | POA: Diagnosis not present

## 2019-09-15 DIAGNOSIS — K5730 Diverticulosis of large intestine without perforation or abscess without bleeding: Secondary | ICD-10-CM | POA: Diagnosis not present

## 2019-09-16 ENCOUNTER — Ambulatory Visit: Payer: Medicare PPO

## 2019-09-26 DIAGNOSIS — L905 Scar conditions and fibrosis of skin: Secondary | ICD-10-CM | POA: Diagnosis not present

## 2019-09-26 DIAGNOSIS — D0359 Melanoma in situ of other part of trunk: Secondary | ICD-10-CM | POA: Diagnosis not present

## 2019-10-02 ENCOUNTER — Ambulatory Visit (INDEPENDENT_AMBULATORY_CARE_PROVIDER_SITE_OTHER): Payer: Medicare PPO | Admitting: General Practice

## 2019-10-02 ENCOUNTER — Other Ambulatory Visit: Payer: Self-pay

## 2019-10-02 DIAGNOSIS — Z86718 Personal history of other venous thrombosis and embolism: Secondary | ICD-10-CM

## 2019-10-02 DIAGNOSIS — Z7901 Long term (current) use of anticoagulants: Secondary | ICD-10-CM | POA: Diagnosis not present

## 2019-10-02 LAB — POCT INR: INR: 3.8 — AB (ref 2.0–3.0)

## 2019-10-02 NOTE — Progress Notes (Signed)
Medical screening examination/treatment/procedure(s) were performed by non-physician practitioner and as supervising physician I was immediately available for consultation/collaboration. I agree with above. Docie Abramovich, MD   

## 2019-10-02 NOTE — Patient Instructions (Signed)
Pre visit review using our clinic review tool, if applicable. No additional management support is needed unless otherwise documented below in the visit note.  Hold dosage today and tomorrow and then continue to take 1 tablet daily except 1/2 tablet on Mon Wed and Fridays.  Re-check in 3 weeks. Increase vegetables up to 2 to 3 servings weekly.

## 2019-10-15 ENCOUNTER — Other Ambulatory Visit: Payer: Self-pay

## 2019-10-15 ENCOUNTER — Ambulatory Visit
Admission: RE | Admit: 2019-10-15 | Discharge: 2019-10-15 | Disposition: A | Discharge status: Home / Self Care | Payer: Medicare PPO | Source: Ambulatory Visit | Attending: Family Medicine | Admitting: Family Medicine

## 2019-10-15 DIAGNOSIS — Z1231 Encounter for screening mammogram for malignant neoplasm of breast: Secondary | ICD-10-CM | POA: Diagnosis not present

## 2019-10-27 NOTE — Progress Notes (Signed)
Cardiology Office Note   Date:  11/05/2019   ID:  [name] Little, [name] 1933/10/05, MRN [id number]  PCP:  [name] Lukes, MD  Cardiologist:   [name] Rouge, MD   No chief complaint on file.     History of Present Illness:  86 y.o. first seen August 2018 for murmur and mitral valve disease.   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 CRF;s HTN, elevated lipids and DM.  Intolerant to statins with lipitor and pravachol causing weakness. Echo done 08/24/16 for edema reviewed  EF 55-60% mild LVH  Mild to moderate MR She has no history of chest pain palpitations PND/Orhtopnea rheumatic fever or SBE  No cardiac complaints Married 65years has 4 children that look after her   Still drives and mows 2.5 acres at home in [facility] Understands that salt indiscretion leads to LE edema Does not take her lasix daily No signs of arrhythmia or CHF   Seen by [name] Little [name] Little for dysuria and hematuria on 08/17/17 CT showed non obstructing 8 mm stone in left duct Urine culture with Ecoli  ? Rx INR Rx at 2.5 Resolved   No issues with heart. Her first cousin is Copywriter, advertising who I care for as well   No cardiac complaints  Lives on some nice land in [place] with her son through the [place]   Past Medical History:  Diagnosis Date  . Anxiety state, unspecified 09/22/2013  . Arthritis    lumbar stenosis, radiculopathy, OA- L hip  . BCC (basal cell carcinoma), scalp/neck 09/27/2015  . Blood clotting disorder (HCC)    V5 factor  . Blood dyscrasia    factor 5 deficiency  . Breast cancer ([place]) 2010   RIGHT breast  . Clostridium difficile infection   . Clotting disorder ([place])   . Diabetes mellitus type 2 in obese ([place]) 02/26/2014  . DVT (deep venous thrombosis) ([place])   . Factor V deficiency ([place])   . Hemorrhoids   . Hyperglycemia 02/26/2014  . Hyperlipemia   . Hypertension   . Infiltrating ductal carcinoma of breast ([place])    stage one right   . Low back pain 03/26/2014  . Macular degeneration   .  Mitral valve regurgitation 09/01/2016  . Mixed hyperlipidemia 05/18/2015  . Nocturia 02/26/2014  . Paroxysmal supraventricular tachycardia ([place])   . Personal history of colonic polyps    hyperplastic  . Thoracic aortic aneurysm ([place])   . TMJ pain dysfunction syndrome     Past Surgical History:  Procedure Laterality Date  . ABDOMINAL HYSTERECTOMY     1960- ? vaginal hysterectomy, not laparoscopic   . BREAST LUMPECTOMY  2010   right [name] Little. Margot Little  . COLONOSCOPY    . EYE SURGERY     bilateral cataracts- /w IOL   . I & D EXTREMITY Right 10/26/2017   Procedure: IRRIGATION AND DEBRIDEMENT RIGHT HAND;  Surgeon: [name] Planas, MD;  Location: [facility];  Service: Orthopedics;  Laterality: Right;  . JOINT REPLACEMENT     R hip replacement   . LAPAROSCOPIC HYSTERECTOMY    . LUMBAR LAMINECTOMY/DECOMPRESSION MICRODISCECTOMY  04/11/2011   Procedure: LUMBAR LAMINECTOMY/DECOMPRESSION MICRODISCECTOMY;  Surgeon: [name] Miss, MD;  Location: [facility];  Service: Neurosurgery;  Laterality: N/A;  Lumbar Five-Sacral One Microdiscectomy  . right calf surgery     for cancer  . SPLENECTOMY    . TOTAL HIP ARTHROPLASTY  1999   right  . UPPER GASTROINTESTINAL ENDOSCOPY  Current Outpatient Medications  Medication Sig Dispense Refill  . ALPRAZolam (XANAX) 0.25 MG tablet TAKE ONE TABLET BY MOUTH TWICE DIALY AS NEEDED FOR ANXIETY 20 tablet 1  . amoxicillin-clavulanate (AUGMENTIN) 875-125 MG tablet Take 1 tablet by mouth 2 (two) times daily. 14 tablet 0  . B Complex Vitamins (VITAMIN B COMPLEX PO) Take 1 tablet by mouth daily.     . Blood Glucose Monitoring Suppl (ONE TOUCH ULTRA 2) w/Device KIT See admin instructions.  0  . Blood Pressure Monitoring (BLOOD PRESSURE KIT) DEVI 1 Device by Does not apply route daily as needed. Fill per patient insurance preference Dx: Hypertension 1 Device 0  . fluconazole (DIFLUCAN) 150 MG tablet Take 1 tablet (150 mg total) by mouth once a week. 2 tablet 0  . furosemide (LASIX)  20 MG tablet TAKE 1 TABLET (20 MG TOTAL) BY MOUTH DAILY AS NEEDED FOR FLUID OR EDEMA. 30 tablet 0  . glucose blood (IGLUCOSE TEST STRIPS) test strip Test Blood Sugars every day as needed 100 each 5  . glucose monitoring kit (FREESTYLE) monitoring kit 1 each by Does not apply route as needed for other. 1 each 0  . hydrochlorothiazide (MICROZIDE) 12.5 MG capsule TAKE 1 CAPSULE BY MOUTH EVERY DAY 30 capsule 2  . KLOR-CON M20 20 MEQ tablet TAKE 1 TABLET (20 MEQ TOTAL) BY MOUTH DAILY AS NEEDED (LASIX USE). 30 tablet 0  . Lancets MISC Test Blood Sugars every day as needed 100 each 5  . losartan (COZAAR) 100 MG tablet TAKE 1 TABLET (100 MG TOTAL) BY MOUTH DAILY. NEEDS OV/FOLLOW UP BEFORE ANY MORE REFILLS 90 tablet 0  . metoprolol succinate (TOPROL-XL) 100 MG 24 hr tablet Take 1 tablet (100 mg total) by mouth daily. Take with or immediately following a meal. 90 tablet 1  . Multiple Vitamins-Minerals (MULTIVITAMIN ADULTS) TABS Take 1 tablet by mouth daily.    Marland Kitchen warfarin (COUMADIN) 5 MG tablet TAKE 1 TABLET (5 MG) BY MOUTH DAILY EXCEPT ON SUN TAKE 0.5 TABLET 84 tablet 0  . ondansetron (ZOFRAN-ODT) 4 MG disintegrating tablet Take 1 tablet by mouth as needed for nausea/vomiting.    . tamsulosin (FLOMAX) 0.4 MG CAPS capsule Take 1 capsule by mouth as needed. Use as directed    . traMADol (ULTRAM) 50 MG tablet Take 1 tablet by mouth as needed for pain.     No current facility-administered medications for this visit.    Allergies:   Hydralazine, Amlodipine, Carvedilol, Cefuroxime axetil, Chlorthalidone, Statins, and Sulfonamide derivatives    Social History:  The patient  reports that she has never smoked. She has never used smokeless tobacco. She reports that she does not drink alcohol and does not use drugs.   Family History:  The patient's family history includes Alzheimer's disease in her sister; Breast cancer in her paternal aunt; Cancer in her paternal aunt; Clotting disorder in her maternal uncle;  Dementia in her sister, sister, and sister; Heart disease in her father, mother, and another family member; Stroke in her sister.    ROS:  Please see the history of present illness.   Otherwise, review of systems are positive for none.   All other systems are reviewed and negative.    PHYSICAL EXAM: VS:  BP (!) 142/82   Pulse 84   Ht '5\' 3"'  (1.6 m)   Wt 172 lb 12.8 oz (78.4 kg)   SpO2 97%   BMI 30.61 kg/m  , BMI Body mass index is 30.61 kg/m. Affect appropriate Healthy:  appears stated age 56: normal Neck supple with no adenopathy JVP normal no bruits no thyromegaly Lungs clear with no wheezing and good diaphragmatic motion Heart:  S1/S2 soft apical MR murmur, no rub, gallop or click PMI normal Abdomen: benighn, BS positve, no tenderness, no AAA no bruit.  No HSM or HJR Distal pulses intact with no bruits Plus one bilateral ankle  edema Neuro non-focal Skin warm and dry No muscular weakness    EKG:  09/06/17 SR rate 71 LAD otherwise normal 10/6 21 SR rate 84 LAD otherwise normal no change    Recent Labs: 05/27/2019: ALT 12; BUN 28; Creatinine, Ser 0.91; Hemoglobin 14.5; Platelets 339.0; Potassium 4.7; Sodium 138; TSH 3.80     Wt Readings from Last 3 Encounters:  11/05/19 172 lb 12.8 oz (78.4 kg)  05/27/19 171 lb 6.4 oz (77.7 kg)  11/26/18 176 lb (79.8 kg)      Other studies Reviewed: Additional studies/ records that were reviewed today include: Notes [name] Little [name] Echo done July 2018 ECG's .    ASSESSMENT AND PLAN:  1. MR- mild to moderate not clinically significant no need for SBE f/u in a year 2. HTN improved continue current meds  3. DM:  Discussed low carb diet.  Target hemoglobin A1c is 6.5 or less.  Continue current medications. 4. Cholesterol continue welchol  intolerant to statins  5. Hypercoagulability: on coumadin followed by primary  6. Anxiety:  Seems stable  7. Edema: dependant from venous disease cut out salt in chicken tenders PRN Lasix     Current medicines are reviewed at length with the patient today.  The patient does not have concerns regarding medicines.  The following changes have been made:  no change  Labs/ tests ordered today include: None  No orders of the defined types were placed in this encounter.    Disposition:   FU with me in a year      Signed, [name] Rouge, MD  11/05/2019 10:08 AM    [facility] [place], [place], [place]  [address] Phone: [phone]; Fax: [phone]

## 2019-10-28 ENCOUNTER — Ambulatory Visit: Payer: Medicare PPO

## 2019-10-30 ENCOUNTER — Other Ambulatory Visit: Payer: Self-pay

## 2019-10-30 ENCOUNTER — Ambulatory Visit (INDEPENDENT_AMBULATORY_CARE_PROVIDER_SITE_OTHER): Payer: Medicare PPO

## 2019-10-30 ENCOUNTER — Telehealth: Payer: Self-pay | Admitting: Family Medicine

## 2019-10-30 DIAGNOSIS — Z23 Encounter for immunization: Secondary | ICD-10-CM

## 2019-10-30 DIAGNOSIS — E669 Obesity, unspecified: Secondary | ICD-10-CM

## 2019-10-30 MED ORDER — IGLUCOSE TEST STRIPS VI STRP: ORAL_STRIP | 5 refills | Status: DC

## 2019-10-30 NOTE — Progress Notes (Signed)
Refill qued to pharmACY

## 2019-10-30 NOTE — Telephone Encounter (Signed)
Pt requesting refill on Test Strips ONE TOUCH ULTRA 2, sent to : [id number] - [place], [place], [place]  [address]  Phone:  [phone] Fax:  [phone]  DEA #:  [id number]

## 2019-10-30 NOTE — Telephone Encounter (Signed)
Refill sent to pharmacy.   

## 2019-11-05 ENCOUNTER — Ambulatory Visit: Payer: Medicare PPO | Admitting: Cardiovascular Disease

## 2019-11-05 ENCOUNTER — Other Ambulatory Visit: Payer: Self-pay

## 2019-11-05 ENCOUNTER — Encounter: Payer: Self-pay | Admitting: Cardiovascular Disease

## 2019-11-05 VITALS — BP 142/82 | HR 84 | Ht 63.0 in | Wt 172.8 lb

## 2019-11-05 DIAGNOSIS — E785 Hyperlipidemia, unspecified: Secondary | ICD-10-CM | POA: Diagnosis not present

## 2019-11-05 DIAGNOSIS — I10 Essential (primary) hypertension: Secondary | ICD-10-CM | POA: Diagnosis not present

## 2019-11-05 DIAGNOSIS — I340 Nonrheumatic mitral (valve) insufficiency: Secondary | ICD-10-CM | POA: Diagnosis not present

## 2019-11-05 NOTE — Patient Instructions (Addendum)

## 2019-11-06 ENCOUNTER — Ambulatory Visit: Payer: Medicare PPO

## 2019-11-07 ENCOUNTER — Other Ambulatory Visit: Payer: Self-pay | Admitting: *Deleted

## 2019-11-07 MED ORDER — ACCU-CHEK GUIDE VI STRP: ORAL_STRIP | 1 refills | Status: DC

## 2019-11-07 MED ORDER — ACCU-CHEK GUIDE ME W/DEVICE KIT: PACK | 0 refills | Status: DC

## 2019-11-07 MED ORDER — ACCU-CHEK SOFTCLIX LANCETS MISC: 1 refills | Status: DC

## 2019-11-11 ENCOUNTER — Other Ambulatory Visit: Payer: Self-pay

## 2019-11-11 ENCOUNTER — Ambulatory Visit (INDEPENDENT_AMBULATORY_CARE_PROVIDER_SITE_OTHER): Payer: Medicare PPO | Admitting: General Practice

## 2019-11-11 DIAGNOSIS — Z7901 Long term (current) use of anticoagulants: Secondary | ICD-10-CM

## 2019-11-11 DIAGNOSIS — Z86718 Personal history of other venous thrombosis and embolism: Secondary | ICD-10-CM

## 2019-11-11 LAB — POCT INR: INR: 2.3 (ref 2.0–3.0)

## 2019-11-11 NOTE — Patient Instructions (Addendum)
Pre visit review using our clinic review tool, if applicable. No additional management support is needed unless otherwise documented below in the visit note.  Continue to take 1 tablet daily except 1/2 tablet on Mon Wed and Fridays.  Re-check in 4 weeks. Increase vegetables up to 2 to 3 servings weekly.

## 2019-11-11 NOTE — Progress Notes (Signed)
Medical screening examination/treatment/procedure(s) were performed by non-physician practitioner and as supervising physician I was immediately available for consultation/collaboration. I agree with above. Chondra Boyde, MD   

## 2019-11-25 ENCOUNTER — Ambulatory Visit: Payer: Medicare PPO | Admitting: Family Medicine

## 2019-12-01 ENCOUNTER — Other Ambulatory Visit: Payer: Self-pay | Admitting: Family Medicine

## 2019-12-09 ENCOUNTER — Ambulatory Visit (INDEPENDENT_AMBULATORY_CARE_PROVIDER_SITE_OTHER): Payer: Medicare PPO | Admitting: General Practice

## 2019-12-09 ENCOUNTER — Other Ambulatory Visit: Payer: Self-pay

## 2019-12-09 DIAGNOSIS — Z7901 Long term (current) use of anticoagulants: Secondary | ICD-10-CM | POA: Diagnosis not present

## 2019-12-09 LAB — POCT INR: INR: 2.2 (ref 2.0–3.0)

## 2019-12-09 NOTE — Progress Notes (Signed)
Medical screening examination/treatment/procedure(s) were performed by non-physician practitioner and as supervising physician I was immediately available for consultation/collaboration. I agree with above. Kandance Yano, MD   

## 2019-12-09 NOTE — Patient Instructions (Addendum)
Pre visit review using our clinic review tool, if applicable. No additional management support is needed unless otherwise documented below in the visit note.  Continue to take 1 tablet daily except 1/2 tablet on Mon Wed and Fridays.  Re-check in 5 weeks. Increase vegetables up to 2 to 3 servings weekly.

## 2019-12-10 ENCOUNTER — Other Ambulatory Visit: Payer: Self-pay | Admitting: Family Medicine

## 2019-12-12 ENCOUNTER — Other Ambulatory Visit: Payer: Self-pay | Admitting: Family Medicine

## 2019-12-19 ENCOUNTER — Other Ambulatory Visit: Payer: Self-pay | Admitting: Family Medicine

## 2019-12-19 ENCOUNTER — Encounter: Payer: Self-pay | Admitting: Family Medicine

## 2019-12-19 ENCOUNTER — Other Ambulatory Visit: Payer: Self-pay

## 2019-12-19 ENCOUNTER — Ambulatory Visit: Payer: Medicare PPO | Admitting: Family Medicine

## 2019-12-19 ENCOUNTER — Telehealth: Payer: Self-pay

## 2019-12-19 VITALS — BP 138/70 | HR 69 | Temp 98.1°F | Resp 20 | Ht 63.0 in | Wt 171.4 lb

## 2019-12-19 DIAGNOSIS — N390 Urinary tract infection, site not specified: Secondary | ICD-10-CM | POA: Diagnosis not present

## 2019-12-19 DIAGNOSIS — E782 Mixed hyperlipidemia: Secondary | ICD-10-CM | POA: Diagnosis not present

## 2019-12-19 DIAGNOSIS — I10 Essential (primary) hypertension: Secondary | ICD-10-CM

## 2019-12-19 DIAGNOSIS — N760 Acute vaginitis: Secondary | ICD-10-CM | POA: Diagnosis not present

## 2019-12-19 DIAGNOSIS — R319 Hematuria, unspecified: Secondary | ICD-10-CM | POA: Diagnosis not present

## 2019-12-19 DIAGNOSIS — E119 Type 2 diabetes mellitus without complications: Secondary | ICD-10-CM | POA: Diagnosis not present

## 2019-12-19 DIAGNOSIS — N200 Calculus of kidney: Secondary | ICD-10-CM

## 2019-12-19 DIAGNOSIS — R300 Dysuria: Secondary | ICD-10-CM | POA: Diagnosis not present

## 2019-12-19 LAB — POC URINALSYSI DIPSTICK (AUTOMATED)
Bilirubin, UA: NEGATIVE
Glucose, UA: NEGATIVE
Ketones, UA: NEGATIVE
Leukocytes, UA: NEGATIVE
Nitrite, UA: POSITIVE
Protein, UA: NEGATIVE
Spec Grav, UA: >=1.03 — AB (ref 1.010–1.025)
Urobilinogen, UA: 0.2 E.U./dL
pH, UA: 5 (ref 5.0–8.0)

## 2019-12-19 MED ORDER — AMOXICILLIN-POT CLAVULANATE 875-125 MG PO TABS
1.0000 | ORAL_TABLET | Freq: Two times a day (BID) | ORAL | 0 refills | Status: DC
Start: 2019-12-19 — End: 2020-01-27

## 2019-12-19 MED ORDER — TAMSULOSIN HCL 0.4 MG PO CAPS: 0.4000 mg | ORAL_CAPSULE | Freq: Every day | ORAL | 0 refills | Status: DC

## 2019-12-19 MED ORDER — DOXYCYCLINE HYCLATE 100 MG PO TABS: 100.0000 mg | ORAL_TABLET | Freq: Two times a day (BID) | ORAL | 0 refills | Status: DC

## 2019-12-19 MED ORDER — CIPROFLOXACIN HCL 250 MG PO TABS: 250.0000 mg | ORAL_TABLET | Freq: Two times a day (BID) | ORAL | 0 refills | Status: DC

## 2019-12-19 MED ORDER — FLUCONAZOLE 150 MG PO TABS: ORAL_TABLET | ORAL | 0 refills | Status: DC

## 2019-12-19 NOTE — Telephone Encounter (Signed)
Pt called stating she has been experiencing urinary symptoms. Pt asked if Dr. Charlett Blake would put a lab order in for her to have her urine checked to see if she has a UTI. Pts husband has an appointment here in an hour and will be coming with him. Please advise.

## 2019-12-19 NOTE — Telephone Encounter (Signed)
Since it is so late I sent in Augmentin which she has taken before to treat a UTI. Have her do a UA with culture next week to confirm her infection is resolving.

## 2019-12-19 NOTE — Assessment & Plan Note (Signed)
Encouraged heart healthy diet, increase exercise, avoid trans fats, consider a krill oil cap daily 

## 2019-12-19 NOTE — Progress Notes (Signed)
Patient ID: [name] Little, female    DOB: Nov 14, 1933  Age: 86 y.o. MRN: [id number]    Subjective:  Subjective  HPI Rahcel Little presents for dysuria and suprapubic pain x several days.  She is also passing kidney stones   No fever   No nvd   Review of Systems  Constitutional: Negative for appetite change, diaphoresis, fatigue and unexpected weight change.  Eyes: Negative for pain, redness and visual disturbance.  Respiratory: Negative for cough, chest tightness, shortness of breath and wheezing.   Cardiovascular: Negative for chest pain, palpitations and leg swelling.  Gastrointestinal: Positive for abdominal pain. Negative for blood in stool, diarrhea, nausea and vomiting.  Endocrine: Negative for cold intolerance, heat intolerance, polydipsia, polyphagia and polyuria.  Genitourinary: Positive for dysuria, flank pain and frequency. Negative for difficulty urinating.  Musculoskeletal: Positive for back pain.  Neurological: Negative for dizziness, light-headedness, numbness and headaches.    History Past Medical History:  Diagnosis Date  . Anxiety state, unspecified 09/22/2013  . Arthritis    lumbar stenosis, radiculopathy, OA- L hip  . BCC (basal cell carcinoma), scalp/neck 09/27/2015  . Blood clotting disorder (HCC)    V5 factor  . Blood dyscrasia    factor 5 deficiency  . Breast cancer ([place]) 2010   RIGHT breast  . Clostridium difficile infection   . Clotting disorder ([place])   . Diabetes mellitus type 2 in obese ([place]) 02/26/2014  . DVT (deep venous thrombosis) ([place])   . Factor V deficiency ([place])   . Hemorrhoids   . Hyperglycemia 02/26/2014  . Hyperlipemia   . Hypertension   . Infiltrating ductal carcinoma of breast ([place])    stage one right   . Low back pain 03/26/2014  . Macular degeneration   . Mitral valve regurgitation 09/01/2016  . Mixed hyperlipidemia 05/18/2015  . Nocturia 02/26/2014  . Paroxysmal supraventricular tachycardia ([place])   . Personal history of  colonic polyps    hyperplastic  . Thoracic aortic aneurysm ([place])   . TMJ pain dysfunction syndrome     She has a past surgical history that includes Total hip arthroplasty (1999); Laparoscopic hysterectomy; Splenectomy; right calf surgery; Colonoscopy; Upper gastrointestinal endoscopy; Joint replacement; Abdominal hysterectomy; Eye surgery; Lumbar laminectomy/decompression microdiscectomy (04/11/2011); I & D extremity (Right, 10/26/2017); and Breast lumpectomy (2010).   Her family history includes Alzheimer's disease in her sister; Breast cancer in her paternal aunt; Cancer in her paternal aunt; Clotting disorder in her maternal uncle; Dementia in her sister, sister, and sister; Heart disease in her father, mother, and another family member; Stroke in her sister.She reports that she has never smoked. She has never used smokeless tobacco. She reports that she does not drink alcohol and does not use drugs.  Current Outpatient Medications on File Prior to Visit  Medication Sig Dispense Refill  . Accu-Chek Softclix Lancets lancets Use to check sugar daily or as needed. 100 each 1  . ALPRAZolam (XANAX) 0.25 MG tablet TAKE ONE TABLET BY MOUTH TWICE DIALY AS NEEDED FOR ANXIETY 20 tablet 1  . amoxicillin-clavulanate (AUGMENTIN) 875-125 MG tablet Take 1 tablet by mouth 2 (two) times daily. 14 tablet 0  . B Complex Vitamins (VITAMIN B COMPLEX PO) Take 1 tablet by mouth daily.     . Blood Glucose Monitoring Suppl (ACCU-CHEK GUIDE ME) w/Device KIT Use to check sugar daily or as needed 1 kit 0  . Blood Pressure Monitoring (BLOOD PRESSURE KIT) DEVI 1 Device by Does not apply route daily as needed. Fill  per patient insurance preference Dx: Hypertension 1 Device 0  . fluconazole (DIFLUCAN) 150 MG tablet Take 1 tablet (150 mg total) by mouth once a week. 2 tablet 0  . furosemide (LASIX) 20 MG tablet TAKE 1 TABLET (20 MG TOTAL) BY MOUTH DAILY AS NEEDED FOR FLUID OR EDEMA. 30 tablet 0  . glucose blood (ACCU-CHEK  GUIDE) test strip Use to check sugar daily or as needed 100 each 1  . hydrochlorothiazide (MICROZIDE) 12.5 MG capsule Take 1 capsule (12.5 mg total) by mouth daily. 90 capsule 0  . KLOR-CON M20 20 MEQ tablet TAKE 1 TABLET (20 MEQ TOTAL) BY MOUTH DAILY AS NEEDED (LASIX USE). 30 tablet 0  . losartan (COZAAR) 100 MG tablet TAKE 1 TABLET (100 MG TOTAL) BY MOUTH DAILY. NEEDS OV/FOLLOW UP BEFORE ANY MORE REFILLS 30 tablet 0  . metoprolol succinate (TOPROL-XL) 100 MG 24 hr tablet Take 1 tablet (100 mg total) by mouth daily. Take with or immediately following a meal. 90 tablet 1  . Multiple Vitamins-Minerals (MULTIVITAMIN ADULTS) TABS Take 1 tablet by mouth daily.    . ondansetron (ZOFRAN-ODT) 4 MG disintegrating tablet Take 1 tablet by mouth as needed for nausea/vomiting.    . tamsulosin (FLOMAX) 0.4 MG CAPS capsule Take 1 capsule by mouth as needed. Use as directed    . traMADol (ULTRAM) 50 MG tablet Take 1 tablet by mouth as needed for pain.    Marland Kitchen warfarin (COUMADIN) 5 MG tablet Take 1 tablet daily except take 1/2 tablet Mon Wed and Fridays or Take as directed by anticoagulation clinic 90 tablet 0   No current facility-administered medications on file prior to visit.     Objective:  Objective  Physical Exam Vitals and nursing note reviewed.  Constitutional:      Appearance: She is well-developed.  HENT:     Head: Normocephalic and atraumatic.  Eyes:     Conjunctiva/sclera: Conjunctivae normal.  Neck:     Thyroid: No thyromegaly.     Vascular: No carotid bruit or JVD.  Cardiovascular:     Rate and Rhythm: Normal rate and regular rhythm.     Heart sounds: Normal heart sounds. No murmur heard.   Pulmonary:     Effort: Pulmonary effort is normal. No respiratory distress.     Breath sounds: Normal breath sounds. No wheezing or rales.  Chest:     Chest wall: No tenderness.  Abdominal:     General: There is no distension.     Tenderness: There is abdominal tenderness in the suprapubic area.  There is no right CVA tenderness, left CVA tenderness, guarding or rebound.  Musculoskeletal:     Cervical back: Normal range of motion and neck supple.  Neurological:     Mental Status: She is alert and oriented to person, place, and time.    BP 138/70 (BP Location: Right Arm, Patient Position: Sitting, Cuff Size: Normal)   Pulse 69   Temp 98.1 F (36.7 C) (Oral)   Resp 20   Ht '5\' 3"'  (1.6 m)   Wt 171 lb 6.4 oz (77.7 kg)   SpO2 98%   BMI 30.36 kg/m  Wt Readings from Last 3 Encounters:  12/19/19 171 lb 6.4 oz (77.7 kg)  11/05/19 172 lb 12.8 oz (78.4 kg)  05/27/19 171 lb 6.4 oz (77.7 kg)     Lab Results  Component Value Date   WBC 9.4 05/27/2019   HGB 14.5 05/27/2019   HCT 44.5 05/27/2019   PLT 339.0 05/27/2019  GLUCOSE 108 (H) 05/27/2019   CHOL 218 (H) 11/26/2018   TRIG 337.0 (H) 11/26/2018   HDL 33.00 (L) 11/26/2018   LDLDIRECT 115.0 11/26/2018   LDLCALC 134 (H) 03/05/2017   ALT 12 05/27/2019   AST 17 05/27/2019   NA 138 05/27/2019   K 4.7 05/27/2019   CL 100 05/27/2019   CREATININE 0.91 05/27/2019   BUN 28 (H) 05/27/2019   CO2 31 05/27/2019   TSH 3.80 05/27/2019   INR 2.2 12/09/2019   HGBA1C 6.8 (H) 11/26/2018   MICROALBUR 1.5 10/20/2014    MM 3D SCREEN BREAST BILATERAL  Result Date: 10/17/2019 CLINICAL DATA:  Screening. EXAM: DIGITAL SCREENING BILATERAL MAMMOGRAM WITH TOMO AND CAD COMPARISON:  Previous exam(s). ACR Breast Density Category b: There are scattered areas of fibroglandular density. FINDINGS: There are no findings suspicious for malignancy. Images were processed with CAD. IMPRESSION: No mammographic evidence of malignancy. A result letter of this screening mammogram will be mailed directly to the patient. RECOMMENDATION: Screening mammogram in one year. (Code:SM-B-01Y) BI-RADS CATEGORY  1: Negative. Electronically Signed   By: Kristopher Oppenheim M.D.   On: 10/17/2019 10:21     Assessment & Plan:  Plan  I am having Margene Cherian. Kegg start on  ciprofloxacin, tamsulosin, and fluconazole. I am also having her maintain her B Complex Vitamins (VITAMIN B COMPLEX PO), Multivitamin Adults, ALPRAZolam, Blood Pressure Kit, metoprolol succinate, Klor-Con M20, furosemide, amoxicillin-clavulanate, fluconazole, ondansetron, tamsulosin, traMADol, Accu-Chek Guide Me, Accu-Chek Guide, Accu-Chek Softclix Lancets, warfarin, hydrochlorothiazide, and losartan.  Meds ordered this encounter  Medications  . ciprofloxacin (CIPRO) 250 MG tablet    Sig: Take 1 tablet (250 mg total) by mouth 2 (two) times daily.    Dispense:  10 tablet    Refill:  0  . tamsulosin (FLOMAX) 0.4 MG CAPS capsule    Sig: Take 1 capsule (0.4 mg total) by mouth daily. Pt has had in past with no problems    Dispense:  30 capsule    Refill:  0  . fluconazole (DIFLUCAN) 150 MG tablet    Sig: 1 po x1, may repeat in 3 days prn    Dispense:  2 tablet    Refill:  0    Problem List Items Addressed This Visit      Unprioritized   Essential hypertension    Well controlled, no changes to meds. Encouraged heart healthy diet such as the DASH diet and exercise as tolerated.       Hyperlipidemia    Encouraged heart healthy diet, increase exercise, avoid trans fats, consider a krill oil cap daily      Kidney stone    flomax  con't to strain urine Pt has tramadol at home  F/u urology prn       Relevant Medications   tamsulosin (FLOMAX) 0.4 MG CAPS capsule   Urinary tract infection with hematuria    abx per orders Azo otc Culture pending       Relevant Medications   ciprofloxacin (CIPRO) 250 MG tablet   fluconazole (DIFLUCAN) 150 MG tablet    Other Visit Diagnoses    Dysuria    -  Primary   Relevant Medications   ciprofloxacin (CIPRO) 250 MG tablet   Other Relevant Orders   POCT Urinalysis Dipstick (Automated) (Completed)   Urine Culture   Acute vaginitis       Relevant Medications   fluconazole (DIFLUCAN) 150 MG tablet   Diet-controlled diabetes mellitus ([place])        Relevant  Orders   Comprehensive metabolic panel   Hemoglobin A1c   Lipid panel   Primary hypertension       Relevant Orders   Comprehensive metabolic panel   Hemoglobin A1c   Lipid panel      Follow-up: Return in about 6 months (around 06/17/2020), or if symptoms worsen or fail to improve, for fasting, annual exam.  Ann Held, DO

## 2019-12-19 NOTE — Patient Instructions (Signed)
Urinary Tract Infection, Adult A urinary tract infection (UTI) is an infection of any part of the urinary tract. The urinary tract includes:  The kidneys.  The ureters.  The bladder.  The urethra. These organs make, store, and get rid of pee (urine) in the body. What are the causes? This is caused by germs (bacteria) in your genital area. These germs grow and cause swelling (inflammation) of your urinary tract. What increases the risk? You are more likely to develop this condition if:  You have a small, thin tube (catheter) to drain pee.  You cannot control when you pee or poop (incontinence).  You are female, and: ? You use these methods to prevent pregnancy:  A medicine that kills sperm (spermicide).  A device that blocks sperm (diaphragm). ? You have low levels of a female hormone (estrogen). ? You are pregnant.  You have genes that add to your risk.  You are sexually active.  You take antibiotic medicines.  You have trouble peeing because of: ? A prostate that is bigger than normal, if you are female. ? A blockage in the part of your body that drains pee from the bladder (urethra). ? A kidney stone. ? A nerve condition that affects your bladder (neurogenic bladder). ? Not getting enough to drink. ? Not peeing often enough.  You have other conditions, such as: ? Diabetes. ? A weak disease-fighting system (immune system). ? Sickle cell disease. ? Gout. ? Injury of the spine. What are the signs or symptoms? Symptoms of this condition include:  Needing to pee right away (urgently).  Peeing often.  Peeing small amounts often.  Pain or burning when peeing.  Blood in the pee.  Pee that smells bad or not like normal.  Trouble peeing.  Pee that is cloudy.  Fluid coming from the vagina, if you are female.  Pain in the belly or lower back. Other symptoms include:  Throwing up (vomiting).  No urge to eat.  Feeling mixed up (confused).  Being tired  and grouchy (irritable).  A fever.  Watery poop (diarrhea). How is this treated? This condition may be treated with:  Antibiotic medicine.  Other medicines.  Drinking enough water. Follow these instructions at home:  Medicines  Take over-the-counter and prescription medicines only as told by your doctor.  If you were prescribed an antibiotic medicine, take it as told by your doctor. Do not stop taking it even if you start to feel better. General instructions  Make sure you: ? Pee until your bladder is empty. ? Do not hold pee for a long time. ? Empty your bladder after sex. ? Wipe from front to back after pooping if you are a female. Use each tissue one time when you wipe.  Drink enough fluid to keep your pee pale yellow.  Keep all follow-up visits as told by your doctor. This is important. Contact a doctor if:  You do not get better after 1-2 days.  Your symptoms go away and then come back. Get help right away if:  You have very bad back pain.  You have very bad pain in your lower belly.  You have a fever.  You are sick to your stomach (nauseous).  You are throwing up. Summary  A urinary tract infection (UTI) is an infection of any part of the urinary tract.  This condition is caused by germs in your genital area.  There are many risk factors for a UTI. These include having a small, thin   tube to drain pee and not being able to control when you pee or poop.  Treatment includes antibiotic medicines for germs.  Drink enough fluid to keep your pee pale yellow. This information is not intended to replace advice given to you by your health care provider. Make sure you discuss any questions you have with your health care provider. Document Revised: 01/03/2018 Document Reviewed: 07/26/2017 Elsevier Patient Education  2020 Elsevier Inc.  

## 2019-12-19 NOTE — Telephone Encounter (Signed)
Doxy sent  

## 2019-12-19 NOTE — Assessment & Plan Note (Signed)
abx per orders Azo otc Culture pending

## 2019-12-19 NOTE — Telephone Encounter (Signed)
Pt came in office since spouse had a NV pt wanted to inform that she is in a lot of pain and wanted to verify with provider if soon can get the lab order in. (pt would like to be called at [phone]) Please advise ASAP.

## 2019-12-19 NOTE — Assessment & Plan Note (Signed)
flomax  con't to strain urine Pt has tramadol at home  F/u urology prn

## 2019-12-19 NOTE — Assessment & Plan Note (Signed)
Well controlled, no changes to meds. Encouraged heart healthy diet such as the DASH diet and exercise as tolerated.  °

## 2019-12-19 NOTE — Telephone Encounter (Signed)
Per [facility]- Cipro is on back order for the last few weeks. They are requesting doxycycline 100mg . Please advise.

## 2019-12-20 LAB — COMPREHENSIVE METABOLIC PANEL
AG Ratio: 1.3 (calc) (ref 1.0–2.5)
ALT: 10 U/L (ref 6–29)
AST: 19 U/L (ref 10–35)
Albumin: 4.1 g/dL (ref 3.6–5.1)
Alkaline phosphatase (APISO): 50 U/L (ref 37–153)
BUN/Creatinine Ratio: 29 (calc) — ABNORMAL HIGH (ref 6–22)
BUN: 26 mg/dL — ABNORMAL HIGH (ref 7–25)
CO2: 27 mmol/L (ref 20–32)
Calcium: 10 mg/dL (ref 8.6–10.4)
Chloride: 102 mmol/L (ref 98–110)
Creat: 0.91 mg/dL — ABNORMAL HIGH (ref 0.60–0.88)
Globulin: 3.2 g/dL (calc) (ref 1.9–3.7)
Glucose, Bld: 98 mg/dL (ref 65–99)
Potassium: 4.3 mmol/L (ref 3.5–5.3)
Sodium: 139 mmol/L (ref 135–146)
Total Bilirubin: 0.6 mg/dL (ref 0.2–1.2)
Total Protein: 7.3 g/dL (ref 6.1–8.1)

## 2019-12-20 LAB — LIPID PANEL
Cholesterol: 220 mg/dL — ABNORMAL HIGH
HDL: 33 mg/dL — ABNORMAL LOW
LDL Cholesterol (Calc): 143 mg/dL — ABNORMAL HIGH
Non-HDL Cholesterol (Calc): 187 mg/dL — ABNORMAL HIGH
Total CHOL/HDL Ratio: 6.7 (calc) — ABNORMAL HIGH
Triglycerides: 289 mg/dL — ABNORMAL HIGH

## 2019-12-20 LAB — HEMOGLOBIN A1C
Hgb A1c MFr Bld: 6.5 % of total Hgb — ABNORMAL HIGH (ref <5.7)
Mean Plasma Glucose: 140 (calc)
eAG (mmol/L): 7.7 (calc)

## 2019-12-21 LAB — URINE CULTURE
MICRO NUMBER:: 11226733
SPECIMEN QUALITY:: ADEQUATE

## 2019-12-23 ENCOUNTER — Other Ambulatory Visit: Payer: Self-pay | Admitting: *Deleted

## 2019-12-23 NOTE — Telephone Encounter (Signed)
Patient evaluated/treated for symptoms by PCP on 12/19/19.

## 2019-12-23 NOTE — Telephone Encounter (Signed)
Do I need to do something ---- doxy should have already been sent in

## 2019-12-23 NOTE — Telephone Encounter (Signed)
No this was duplicate.

## 2020-01-13 ENCOUNTER — Other Ambulatory Visit: Payer: Self-pay

## 2020-01-13 ENCOUNTER — Ambulatory Visit (INDEPENDENT_AMBULATORY_CARE_PROVIDER_SITE_OTHER): Payer: Medicare PPO | Admitting: General Practice

## 2020-01-13 DIAGNOSIS — Z7901 Long term (current) use of anticoagulants: Secondary | ICD-10-CM | POA: Diagnosis not present

## 2020-01-13 DIAGNOSIS — Z86718 Personal history of other venous thrombosis and embolism: Secondary | ICD-10-CM

## 2020-01-13 LAB — POCT INR: INR: 3.2 — AB (ref 2.0–3.0)

## 2020-01-13 NOTE — Progress Notes (Signed)
Medical screening examination/treatment/procedure(s) were performed by non-physician practitioner and as supervising physician I was immediately available for consultation/collaboration. I agree with above. Dreamer Carillo, MD   

## 2020-01-13 NOTE — Patient Instructions (Addendum)
Pre visit review using our clinic review tool, if applicable. No additional management support is needed unless otherwise documented below in the visit note.  Skip dosage today and then continue to take 1 tablet daily except 1/2 tablet on Mon Wed and Fridays.  Re-check in 3 to 4 weeks.  Increase vegetables up to 2 to 3 servings weekly.

## 2020-01-19 ENCOUNTER — Other Ambulatory Visit: Payer: Self-pay | Admitting: Family Medicine

## 2020-01-19 ENCOUNTER — Telehealth: Payer: Self-pay | Admitting: Family Medicine

## 2020-01-19 MED ORDER — POTASSIUM CHLORIDE CRYS ER 20 MEQ PO TBCR
20.0000 meq | EXTENDED_RELEASE_TABLET | Freq: Every day | ORAL | 1 refills | Status: DC | PRN
Start: 2020-01-19 — End: 2021-01-06

## 2020-01-19 NOTE — Telephone Encounter (Signed)
Ok to refill 

## 2020-01-19 NOTE — Telephone Encounter (Signed)
Rx sent 

## 2020-01-19 NOTE — Telephone Encounter (Signed)
  Patient is requesting a refill of    KLOR-CON M20 20 MEQ tablet [[id number]]  Patient uses this pharmacy   [id number] - [place], [place], [place] [address]  Phone:  [phone] Fax:  [phone]

## 2020-01-19 NOTE — Telephone Encounter (Signed)
Please advise- last refilled 07/2018.

## 2020-01-27 ENCOUNTER — Ambulatory Visit (HOSPITAL_COMMUNITY): Payer: Medicare PPO | Admitting: Anesthesiology

## 2020-01-27 ENCOUNTER — Observation Stay (HOSPITAL_COMMUNITY)
Admission: AD | Admit: 2020-01-27 | Discharge: 2020-01-28 | Disposition: A | Discharge status: Home / Self Care | Payer: Medicare PPO | Source: Ambulatory Visit | Attending: Urology | Admitting: Urology

## 2020-01-27 ENCOUNTER — Encounter (HOSPITAL_COMMUNITY)
Admission: AD | Disposition: A | Discharge status: Home / Self Care | Payer: Self-pay | Source: Ambulatory Visit | Attending: Urology

## 2020-01-27 ENCOUNTER — Ambulatory Visit (HOSPITAL_COMMUNITY): Payer: Medicare PPO

## 2020-01-27 ENCOUNTER — Other Ambulatory Visit: Payer: Self-pay | Admitting: Urology

## 2020-01-27 ENCOUNTER — Other Ambulatory Visit: Payer: Self-pay

## 2020-01-27 DIAGNOSIS — Z96643 Presence of artificial hip joint, bilateral: Secondary | ICD-10-CM | POA: Insufficient documentation

## 2020-01-27 DIAGNOSIS — Z79899 Other long term (current) drug therapy: Secondary | ICD-10-CM | POA: Diagnosis not present

## 2020-01-27 DIAGNOSIS — E119 Type 2 diabetes mellitus without complications: Secondary | ICD-10-CM | POA: Diagnosis not present

## 2020-01-27 DIAGNOSIS — I10 Essential (primary) hypertension: Secondary | ICD-10-CM | POA: Insufficient documentation

## 2020-01-27 DIAGNOSIS — N132 Hydronephrosis with renal and ureteral calculous obstruction: Principal | ICD-10-CM | POA: Insufficient documentation

## 2020-01-27 DIAGNOSIS — Z7901 Long term (current) use of anticoagulants: Secondary | ICD-10-CM | POA: Diagnosis not present

## 2020-01-27 DIAGNOSIS — Z853 Personal history of malignant neoplasm of breast: Secondary | ICD-10-CM | POA: Insufficient documentation

## 2020-01-27 DIAGNOSIS — Z20822 Contact with and (suspected) exposure to covid-19: Secondary | ICD-10-CM | POA: Insufficient documentation

## 2020-01-27 DIAGNOSIS — R8271 Bacteriuria: Secondary | ICD-10-CM | POA: Diagnosis not present

## 2020-01-27 DIAGNOSIS — I712 Thoracic aortic aneurysm, without rupture: Secondary | ICD-10-CM | POA: Diagnosis not present

## 2020-01-27 DIAGNOSIS — R509 Fever, unspecified: Secondary | ICD-10-CM | POA: Diagnosis not present

## 2020-01-27 DIAGNOSIS — N209 Urinary calculus, unspecified: Secondary | ICD-10-CM | POA: Diagnosis present

## 2020-01-27 DIAGNOSIS — N201 Calculus of ureter: Secondary | ICD-10-CM | POA: Diagnosis not present

## 2020-01-27 DIAGNOSIS — K8020 Calculus of gallbladder without cholecystitis without obstruction: Secondary | ICD-10-CM | POA: Diagnosis not present

## 2020-01-27 DIAGNOSIS — R3129 Other microscopic hematuria: Secondary | ICD-10-CM | POA: Diagnosis not present

## 2020-01-27 DIAGNOSIS — R112 Nausea with vomiting, unspecified: Secondary | ICD-10-CM | POA: Diagnosis not present

## 2020-01-27 DIAGNOSIS — I471 Supraventricular tachycardia: Secondary | ICD-10-CM | POA: Diagnosis not present

## 2020-01-27 HISTORY — PX: CYSTOSCOPY WITH STENT PLACEMENT: SHX5790

## 2020-01-27 LAB — BASIC METABOLIC PANEL
Anion gap: 12 (ref 5–15)
BUN: 24 mg/dL — ABNORMAL HIGH (ref 8–23)
CO2: 27 mmol/L (ref 22–32)
Calcium: 9.3 mg/dL (ref 8.9–10.3)
Chloride: 101 mmol/L (ref 98–111)
Creatinine, Ser: 1.03 mg/dL — ABNORMAL HIGH (ref 0.44–1.00)
GFR, Estimated: 54 mL/min — ABNORMAL LOW (ref >60)
Glucose, Bld: 114 mg/dL — ABNORMAL HIGH (ref 70–99)
Potassium: 3.7 mmol/L (ref 3.5–5.1)
Sodium: 140 mmol/L (ref 135–145)

## 2020-01-27 LAB — SARS CORONAVIRUS 2 BY RT PCR (HOSPITAL ORDER, PERFORMED IN ~~LOC~~ HOSPITAL LAB): SARS Coronavirus 2: NEGATIVE

## 2020-01-27 SURGERY — CYSTOSCOPY, WITH STENT INSERTION
Anesthesia: General | Laterality: Right

## 2020-01-27 MED ORDER — ONDANSETRON HCL 4 MG/2ML IJ SOLN
INTRAMUSCULAR | Status: DC | PRN
  Administered 2020-01-27: 4 mg via INTRAVENOUS

## 2020-01-27 MED ORDER — IOHEXOL 300 MG/ML  SOLN
INTRAMUSCULAR | Status: DC | PRN
  Administered 2020-01-27: 20:00:00 10 mL

## 2020-01-27 MED ORDER — GENTAMICIN IN SALINE 1.6-0.9 MG/ML-% IV SOLN
80.0000 mg | Freq: Once | INTRAVENOUS | Status: AC
  Administered 2020-01-27: 19:00:00 80 mg via INTRAVENOUS
  Filled 2020-01-27: qty 50

## 2020-01-27 MED ORDER — PROPOFOL 500 MG/50ML IV EMUL
INTRAVENOUS | Status: DC | PRN
  Administered 2020-01-27: 150 mg via INTRAVENOUS

## 2020-01-27 MED ORDER — FENTANYL CITRATE (PF) 100 MCG/2ML IJ SOLN: 25.0000 ug | INTRAMUSCULAR | Status: DC | PRN

## 2020-01-27 MED ORDER — DEXAMETHASONE SODIUM PHOSPHATE 10 MG/ML IJ SOLN
INTRAMUSCULAR | Status: AC
  Filled 2020-01-27: qty 1

## 2020-01-27 MED ORDER — OXYCODONE HCL 5 MG/5ML PO SOLN: 5.0000 mg | Freq: Once | ORAL | Status: DC | PRN

## 2020-01-27 MED ORDER — CHLORHEXIDINE GLUCONATE 0.12 % MT SOLN
15.0000 mL | OROMUCOSAL | Status: AC
  Administered 2020-01-27: 18:00:00 15 mL via OROMUCOSAL

## 2020-01-27 MED ORDER — LACTATED RINGERS IV SOLN: Freq: Once | INTRAVENOUS | Status: AC

## 2020-01-27 MED ORDER — FENTANYL CITRATE (PF) 100 MCG/2ML IJ SOLN
INTRAMUSCULAR | Status: AC
  Filled 2020-01-27: qty 2

## 2020-01-27 MED ORDER — PROPOFOL 10 MG/ML IV BOLUS
INTRAVENOUS | Status: AC
  Filled 2020-01-27: qty 20

## 2020-01-27 MED ORDER — DEXAMETHASONE SODIUM PHOSPHATE 10 MG/ML IJ SOLN
INTRAMUSCULAR | Status: DC | PRN
  Administered 2020-01-27: 10 mg via INTRAVENOUS

## 2020-01-27 MED ORDER — OXYCODONE HCL 5 MG PO TABS: 5.0000 mg | ORAL_TABLET | Freq: Once | ORAL | Status: DC | PRN

## 2020-01-27 MED ORDER — STERILE WATER FOR IRRIGATION IR SOLN
Status: DC | PRN
  Administered 2020-01-27: 3000 mL

## 2020-01-27 MED ORDER — ONDANSETRON HCL 4 MG/2ML IJ SOLN
INTRAMUSCULAR | Status: AC
  Filled 2020-01-27: qty 2

## 2020-01-27 MED ORDER — FENTANYL CITRATE (PF) 100 MCG/2ML IJ SOLN
INTRAMUSCULAR | Status: DC | PRN
  Administered 2020-01-27: 25 ug via INTRAVENOUS

## 2020-01-27 MED ORDER — ALBUTEROL SULFATE HFA 108 (90 BASE) MCG/ACT IN AERS
INHALATION_SPRAY | RESPIRATORY_TRACT | Status: AC
  Filled 2020-01-27: qty 6.7

## 2020-01-27 MED ORDER — LACTATED RINGERS IV SOLN: INTRAVENOUS | Status: DC | PRN

## 2020-01-27 MED ORDER — LIDOCAINE 2% (20 MG/ML) 5 ML SYRINGE
INTRAMUSCULAR | Status: DC | PRN
  Administered 2020-01-27: 50 mg via INTRAVENOUS

## 2020-01-27 SURGICAL SUPPLY — 15 items
BAG URO CATCHER STRL LF (MISCELLANEOUS) ×2 IMPLANT
BASKET ZERO TIP NITINOL 2.4FR (BASKET) IMPLANT
BSKT STON RTRVL ZERO TP 2.4FR (BASKET)
CATH INTERMIT  6FR 70CM (CATHETERS) IMPLANT
CLOTH BEACON ORANGE TIMEOUT ST (SAFETY) ×2 IMPLANT
GLOVE SURG ENC TEXT LTX SZ7.5 (GLOVE) ×2 IMPLANT
GOWN STRL REUS W/TWL LRG LVL3 (GOWN DISPOSABLE) ×2 IMPLANT
GUIDEWIRE ANG ZIPWIRE 038X150 (WIRE) ×2 IMPLANT
GUIDEWIRE STR DUAL SENSOR (WIRE) IMPLANT
KIT TURNOVER KIT A (KITS) IMPLANT
MANIFOLD NEPTUNE II (INSTRUMENTS) ×2 IMPLANT
PACK CYSTO (CUSTOM PROCEDURE TRAY) ×2 IMPLANT
STENT POLARIS 5FRX24 (STENTS) ×1 IMPLANT
TUBING CONNECTING 10 (TUBING) ×2 IMPLANT
TUBING UROLOGY SET (TUBING) IMPLANT

## 2020-01-27 NOTE — Anesthesia Procedure Notes (Signed)
Procedure Name: LMA Insertion Date/Time: 01/27/2020 7:18 PM Performed by: Basilio Cairo, CRNA Pre-anesthesia Checklist: Patient identified, Patient being monitored, Timeout performed, Emergency Drugs available and Suction available Patient Re-evaluated:Patient Re-evaluated prior to induction Oxygen Delivery Method: Circle system utilized Preoxygenation: Pre-oxygenation with 100% oxygen Induction Type: IV induction Ventilation: Mask ventilation without difficulty LMA: LMA inserted and LMA with gastric port inserted LMA Size: 4.0 Tube type: Oral Number of attempts: 1 Placement Confirmation: positive ETCO2 and breath sounds checked- equal and bilateral Tube secured with: Tape Dental Injury: Teeth and Oropharynx as per pre-operative assessment

## 2020-01-27 NOTE — H&P (Signed)
[name] Little is an 86 y.o. female.    Chief Complaint: Recurrent Urolithiasis, Obstructing Pyelonephrisis, Medical Stone Disease -   HPI:    1- Recurrent Urolithiasis - Pt passing multifocal Rt ureteral stones since 08/2019, CT 01/27/20 with Rt UVJ stone + residual multifocal renal stones + hydro.  2 - Obstructing Pyelonephritis - Rt hydro to distal ureter, large bacteruria, subjective fevers / malaise. UCX x several negative previously.   2 - Medical Stone Disease -  Eval 2021: BMP, PTH, Urate - pending; Composition - 80% Urate, 20% CaOx, 24 hr Urines - pending  PMH sig for breast CA, F5Leiden (coumadin clinic at [facility]), Splenectomy.  Today "[name] Little" is seen for urgent Rt stent for obstructing stone in setting of pyelo.   Past Medical History:  Diagnosis Date  . Anxiety state, unspecified 09/22/2013  . Arthritis    lumbar stenosis, radiculopathy, OA- L hip  . BCC (basal cell carcinoma), scalp/neck 09/27/2015  . Blood clotting disorder (HCC)    V5 factor  . Blood dyscrasia    factor 5 deficiency  . Breast cancer ([place]) 2010   RIGHT breast  . Clostridium difficile infection   . Clotting disorder ([place])   . Diabetes mellitus type 2 in obese ([place]) 02/26/2014  . DVT (deep venous thrombosis) ([place])   . Factor V deficiency ([place])   . Hemorrhoids   . Hyperglycemia 02/26/2014  . Hyperlipemia   . Hypertension   . Infiltrating ductal carcinoma of breast ([place])    stage one right   . Low back pain 03/26/2014  . Macular degeneration   . Mitral valve regurgitation 09/01/2016  . Mixed hyperlipidemia 05/18/2015  . Nocturia 02/26/2014  . Paroxysmal supraventricular tachycardia ([place])   . Personal history of colonic polyps    hyperplastic  . Thoracic aortic aneurysm ([place])   . TMJ pain dysfunction syndrome     Past Surgical History:  Procedure Laterality Date  . ABDOMINAL HYSTERECTOMY     1960- ? vaginal hysterectomy, not laparoscopic   . BREAST LUMPECTOMY  2010   right Dr. [name]  .  COLONOSCOPY    . EYE SURGERY     bilateral cataracts- /w IOL   . I & D EXTREMITY Right 10/26/2017   Procedure: IRRIGATION AND DEBRIDEMENT RIGHT HAND;  Surgeon: Iran Planas, MD;  Location: [facility];  Service: Orthopedics;  Laterality: Right;  . JOINT REPLACEMENT     R hip replacement   . LAPAROSCOPIC HYSTERECTOMY    . LUMBAR LAMINECTOMY/DECOMPRESSION MICRODISCECTOMY  04/11/2011   Procedure: LUMBAR LAMINECTOMY/DECOMPRESSION MICRODISCECTOMY;  Surgeon: [name], MD;  Location: [facility];  Service: Neurosurgery;  Laterality: N/A;  Lumbar Five-Sacral One Microdiscectomy  . right calf surgery     for cancer  . SPLENECTOMY    . TOTAL HIP ARTHROPLASTY  1999   right  . UPPER GASTROINTESTINAL ENDOSCOPY      Family History  Problem Relation Age of Onset  . Heart disease Mother   . Heart disease Father   . Alzheimer's disease Sister   . Dementia Sister   . Dementia Sister   . Heart disease Other        uncles  . Clotting disorder Maternal Uncle   . Breast cancer Paternal Aunt   . Cancer Paternal Aunt        breast  . Stroke Sister   . Dementia Sister        mean, carotid artery disease  . Colon cancer Neg Hx   . Anesthesia  problems Neg Hx   . Hypotension Neg Hx   . Malignant hyperthermia Neg Hx   . Pseudochol deficiency Neg Hx    Social History:  reports that she has never smoked. She has never used smokeless tobacco. She reports that she does not drink alcohol and does not use drugs.  Allergies:  Allergies  Allergen Reactions  . Hydralazine Other (See Comments)    Joint and chest pain  . Amlodipine Swelling    "my whole body swelled"  . Carvedilol Swelling    "my whole body swelled"  . Cefuroxime Axetil     REACTION: Ddoes not remember reaction  . Chlorthalidone     Patient reported extreme weakness.  . Statins Other (See Comments)    Weakness, pain Lipitor and Pravachol Weakness, pain Lipitor and Pravachol  . Sulfonamide Derivatives     REACTION: Rash     Medications Prior to Admission  Medication Sig Dispense Refill  . ALPRAZolam (XANAX) 0.25 MG tablet TAKE ONE TABLET BY MOUTH TWICE DIALY AS NEEDED FOR ANXIETY (Patient taking differently: Take 0.25 mg by mouth 2 (two) times daily as needed. TAKE ONE TABLET BY MOUTH TWICE DIALY AS NEEDED FOR ANXIETY) 20 tablet 1  . B Complex Vitamins (VITAMIN B COMPLEX PO) Take 1 tablet by mouth daily.     . furosemide (LASIX) 20 MG tablet TAKE 1 TABLET (20 MG TOTAL) BY MOUTH DAILY AS NEEDED FOR FLUID OR EDEMA. 30 tablet 0  . hydrochlorothiazide (MICROZIDE) 12.5 MG capsule Take 1 capsule (12.5 mg total) by mouth daily. 90 capsule 0  . losartan (COZAAR) 100 MG tablet Take 1 tablet (100 mg total) by mouth daily. 90 tablet 1  . potassium chloride SA (KLOR-CON M20) 20 MEQ tablet Take 1 tablet (20 mEq total) by mouth daily as needed. (Patient taking differently: Take 20 mEq by mouth daily as needed (supplement).) 90 tablet 1  . tamsulosin (FLOMAX) 0.4 MG CAPS capsule Take 1 capsule by mouth as needed [place] Levo retention). Use as directed    . traMADol (ULTRAM) 50 MG tablet Take 1 tablet by mouth daily as needed for pain or moderate pain.    Marland Kitchen warfarin (COUMADIN) 5 MG tablet Take 1 tablet daily except take 1/2 tablet Mon Wed and Fridays or Take as directed by anticoagulation clinic (Patient taking differently: Take 5 mg by mouth See admin instructions. Take 1 tablet daily except take 1/2 tablet Mon Wed and Fridays .) 90 tablet 0  . Accu-Chek Softclix Lancets lancets Use to check sugar daily or as needed. 100 each 1  . Blood Glucose Monitoring Suppl (ACCU-CHEK GUIDE ME) w/Device KIT Use to check sugar daily or as needed 1 kit 0  . Blood Pressure Monitoring (BLOOD PRESSURE KIT) DEVI 1 Device by Does not apply route daily as needed. Fill per patient insurance preference Dx: Hypertension 1 Device 0  . fluconazole (DIFLUCAN) 150 MG tablet Take 1 tablet (150 mg total) by mouth once a week. (Patient not taking: No sig reported)  2 tablet 0  . fluconazole (DIFLUCAN) 150 MG tablet 1 po x1, may repeat in 3 days prn (Patient not taking: No sig reported) 2 tablet 0  . glucose blood (ACCU-CHEK GUIDE) test strip Use to check sugar daily or as needed 100 each 1  . metoprolol succinate (TOPROL-XL) 100 MG 24 hr tablet Take 1 tablet (100 mg total) by mouth daily. Take with or immediately following a meal. (Patient not taking: No sig reported) 90 tablet 1  . tamsulosin (  FLOMAX) 0.4 MG CAPS capsule Take 1 capsule (0.4 mg total) by mouth daily. Pt has had in past with no problems (Patient not taking: No sig reported) 30 capsule 0    No results found for this or any previous visit (from the past 48 hour(s)). No results found.  Review of Systems  Constitutional: Positive for chills and diaphoresis.  All other systems reviewed and are negative.   Blood pressure (!) 192/74, pulse (!) 56, temperature (!) 97.2 F (36.2 C), temperature source Oral, resp. rate 20, height '5\' 3"'  (1.6 m), weight 75.2 kg, SpO2 96 %. Physical Exam HENT:     Head: Normocephalic.     Nose: Nose normal.  Eyes:     Pupils: Pupils are equal, round, and reactive to light.  Cardiovascular:     Rate and Rhythm: Normal rate.  Pulmonary:     Effort: Pulmonary effort is normal.  Abdominal:     General: Abdomen is flat.  Genitourinary:    Comments: Mild Rt CVAT at present.  Skin:    General: Skin is warm.  Neurological:     General: No focal deficit present.  Psychiatric:        Mood and Affect: Mood normal.      Assessment/Plan    1- Recurrent Urolithiasis - Rec stent today with goal or decompression, then ureteroscopy in outpatient setting with goal of stone free. Risks, benefits, alternatives, expected peri-op course discussed. Admit post-op to verify afebrile x 12-24 hours before DC home.   2 - Obstructing Pyelonephritis - Gent peri-op, then per pharmacy  2 - Medical Stone Disease - Rec complete metabolic eval in elective setting.   Alexis Frock, MD 01/27/2020, 6:43 PM

## 2020-01-27 NOTE — Brief Op Note (Signed)
01/27/2020  7:41 PM  PATIENT:  [name] Little  86 y.o. female  PRE-OPERATIVE DIAGNOSIS:  RIGHT URETEROVESICAL JUNCTION STONE, Pyelonephritis  POST-OPERATIVE DIAGNOSIS:  RIGHT URETEROVESICAL JUNCTION STONE, Pyelonephritis  PROCEDURE:  Procedure(s): CYSTOSCOPY WITH STENT PLACEMENT (Right)  SURGEON:  Surgeon(s) and Role:    * Sebastian Ache, MD - Primary  PHYSICIAN ASSISTANT:   ASSISTANTS: none   ANESTHESIA:   general  EBL:  minimal   BLOOD ADMINISTERED:none  DRAINS: none   LOCAL MEDICATIONS USED:  NONE  SPECIMEN:  Source of Specimen:  Rt renal pelvis urine  DISPOSITION OF SPECIMEN:  microbiology  COUNTS:  YES  TOURNIQUET:  * No tourniquets in log *  DICTATION: .Other Dictation: Dictation Number [id number]  PLAN OF CARE: Admit for overnight observation  PATIENT DISPOSITION:  PACU - hemodynamically stable.   Delay start of Pharmacological VTE agent (>24hrs) due to surgical blood loss or risk of bleeding: no

## 2020-01-27 NOTE — Transfer of Care (Addendum)
Immediate Anesthesia Transfer of Care Note  Patient: [name] Little  Procedure(s) Performed: Procedure(s): CYSTOSCOPY WITH STENT PLACEMENT (Right)  Patient Location: PACU  Anesthesia Type:General  Level of Consciousness: Alert, Awake, Oriented  Airway & Oxygen Therapy: Patient Spontanous Breathing  Post-op Assessment: Report given to RN  Post vital signs: Reviewed and stable  Last Vitals:  Vitals:   01/27/20 1752  BP: (!) 192/74  Pulse: (!) 56  Resp: 20  Temp: (!) 36.2 C  SpO2: 96%    Complications: No apparent anesthesia complications

## 2020-01-27 NOTE — Anesthesia Postprocedure Evaluation (Signed)
Anesthesia Post Note  Patient: [name] Little  Procedure(s) Performed: CYSTOSCOPY WITH STENT PLACEMENT (Right )     Patient location during evaluation: PACU Anesthesia Type: General Level of consciousness: awake and alert, patient cooperative and oriented Pain management: pain level controlled Vital Signs Assessment: post-procedure vital signs reviewed and stable Respiratory status: spontaneous breathing, nonlabored ventilation and respiratory function stable Cardiovascular status: blood pressure returned to baseline and stable Postop Assessment: no apparent nausea or vomiting and adequate PO intake Anesthetic complications: no   No complications documented.  Last Vitals:  Vitals:   01/27/20 2015 01/27/20 2045  BP: (!) 163/68 (!) 160/60  Pulse: (!) 52 (!) 57  Resp: 13 12  Temp:    SpO2: 97% 99%    Last Pain:  Vitals:   01/27/20 2045  TempSrc:   PainSc: 0-No pain                 [name] Little,E. Yolani Vo

## 2020-01-27 NOTE — Anesthesia Preprocedure Evaluation (Addendum)
Anesthesia Evaluation  Patient identified by MRN, date of birth, ID band Patient awake    Reviewed: Allergy & Precautions, NPO status , Patient's Chart, lab work & pertinent test results, reviewed documented beta blocker date and time   History of Anesthesia Complications Negative for: history of anesthetic complications  Airway Mallampati: I  TM Distance: >3 FB Neck ROM: Full    Dental  (+) Dental Advisory Given, Missing   Pulmonary neg pulmonary ROS,  01/27/2020 SARS coronavirus NEG   breath sounds clear to auscultation       Cardiovascular hypertension, Pt. on medications and Pt. on home beta blockers + Peripheral Vascular Disease (Thoracic aortic aneurysm)  + dysrhythmias Supra Ventricular Tachycardia  Rhythm:Regular Rate:Normal  '18 ECHO: EF 55% to 60%. Wall motion was normal, no regional wall motion abnormalities. aortic root was mildly dilated. mild to moderate Mitral regurgitation   Neuro/Psych Anxiety negative neurological ROS     GI/Hepatic negative GI ROS, Neg liver ROS,   Endo/Other  diabetes (diet managed)  Renal/GU Renal InsufficiencyRenal disease (stones)     Musculoskeletal  (+) Arthritis ,   Abdominal   Peds  Hematology  (+) Blood dyscrasia (Factor V Leiden), , coumadin   Anesthesia Other Findings   Reproductive/Obstetrics                            Anesthesia Physical Anesthesia Plan  ASA: III  Anesthesia Plan: General   Post-op Pain Management:    Induction: Intravenous  PONV Risk Score and Plan: 3 and Ondansetron, Dexamethasone and Treatment may vary due to age or medical condition  Airway Management Planned: LMA  Additional Equipment: None  Intra-op Plan:   Post-operative Plan:   Informed Consent: I have reviewed the patients History and Physical, chart, labs and discussed the procedure including the risks, benefits and alternatives for the proposed  anesthesia with the patient or authorized representative who has indicated his/her understanding and acceptance.     Dental advisory given  Plan Discussed with: CRNA and Surgeon  Anesthesia Plan Comments:        Anesthesia Quick Evaluation

## 2020-01-28 ENCOUNTER — Encounter (HOSPITAL_COMMUNITY): Payer: Self-pay | Admitting: Urology

## 2020-01-28 ENCOUNTER — Other Ambulatory Visit: Payer: Self-pay

## 2020-01-28 DIAGNOSIS — N132 Hydronephrosis with renal and ureteral calculous obstruction: Secondary | ICD-10-CM | POA: Diagnosis not present

## 2020-01-28 DIAGNOSIS — I10 Essential (primary) hypertension: Secondary | ICD-10-CM | POA: Diagnosis not present

## 2020-01-28 DIAGNOSIS — R8271 Bacteriuria: Secondary | ICD-10-CM | POA: Diagnosis not present

## 2020-01-28 DIAGNOSIS — Z853 Personal history of malignant neoplasm of breast: Secondary | ICD-10-CM | POA: Diagnosis not present

## 2020-01-28 DIAGNOSIS — Z79899 Other long term (current) drug therapy: Secondary | ICD-10-CM | POA: Diagnosis not present

## 2020-01-28 DIAGNOSIS — Z7901 Long term (current) use of anticoagulants: Secondary | ICD-10-CM | POA: Diagnosis not present

## 2020-01-28 DIAGNOSIS — E119 Type 2 diabetes mellitus without complications: Secondary | ICD-10-CM | POA: Diagnosis not present

## 2020-01-28 DIAGNOSIS — Z20822 Contact with and (suspected) exposure to covid-19: Secondary | ICD-10-CM | POA: Diagnosis not present

## 2020-01-28 DIAGNOSIS — Z96643 Presence of artificial hip joint, bilateral: Secondary | ICD-10-CM | POA: Diagnosis not present

## 2020-01-28 LAB — PROTIME-INR
INR: 2 — ABNORMAL HIGH (ref 0.8–1.2)
Prothrombin Time: 21.7 seconds — ABNORMAL HIGH (ref 11.4–15.2)

## 2020-01-28 MED ORDER — ACETAMINOPHEN 500 MG PO TABS
1000.0000 mg | ORAL_TABLET | Freq: Two times a day (BID) | ORAL | Status: DC
  Administered 2020-01-28: 02:00:00 1000 mg via ORAL
  Filled 2020-01-28 (×2): qty 2

## 2020-01-28 MED ORDER — HYDROMORPHONE HCL 1 MG/ML IJ SOLN: 0.5000 mg | INTRAMUSCULAR | Status: DC | PRN

## 2020-01-28 MED ORDER — SODIUM CHLORIDE 0.9 % IV SOLN
2.0000 g | INTRAVENOUS | Status: DC
  Administered 2020-01-28: 12:00:00 2 g via INTRAVENOUS
  Filled 2020-01-28: qty 2

## 2020-01-28 MED ORDER — TRAMADOL HCL 50 MG PO TABS: 50.0000 mg | ORAL_TABLET | Freq: Three times a day (TID) | ORAL | 0 refills | Status: DC | PRN

## 2020-01-28 MED ORDER — WARFARIN - PHYSICIAN DOSING INPATIENT: Freq: Every day | Status: DC

## 2020-01-28 MED ORDER — WARFARIN SODIUM 5 MG PO TABS: 5.0000 mg | ORAL_TABLET | ORAL | Status: DC

## 2020-01-28 MED ORDER — GENTAMICIN SULFATE 40 MG/ML IJ SOLN
7.0000 mg/kg | Freq: Once | INTRAVENOUS | Status: AC
  Administered 2020-01-28: 07:00:00 430 mg via INTRAVENOUS
  Filled 2020-01-28: qty 10.75

## 2020-01-28 MED ORDER — TRAMADOL HCL 50 MG PO TABS: 50.0000 mg | ORAL_TABLET | Freq: Four times a day (QID) | ORAL | Status: DC | PRN

## 2020-01-28 MED ORDER — FLUCONAZOLE 150 MG PO TABS: 150.0000 mg | ORAL_TABLET | Freq: Once | ORAL | 0 refills | Status: DC | PRN

## 2020-01-28 MED ORDER — SENNOSIDES-DOCUSATE SODIUM 8.6-50 MG PO TABS
1.0000 | ORAL_TABLET | Freq: Two times a day (BID) | ORAL | Status: DC
  Administered 2020-01-28: 02:00:00 1 via ORAL
  Filled 2020-01-28 (×2): qty 1

## 2020-01-28 MED ORDER — CEPHALEXIN 500 MG PO CAPS: 500.0000 mg | ORAL_CAPSULE | Freq: Two times a day (BID) | ORAL | 0 refills | Status: DC

## 2020-01-28 MED ORDER — SODIUM CHLORIDE 0.9 % IV SOLN: INTRAVENOUS | Status: DC

## 2020-01-28 MED ORDER — WARFARIN SODIUM 2.5 MG PO TABS: 2.5000 mg | ORAL_TABLET | ORAL | Status: DC

## 2020-01-28 MED ORDER — HYDROCHLOROTHIAZIDE 12.5 MG PO CAPS
12.5000 mg | ORAL_CAPSULE | Freq: Every day | ORAL | Status: DC
  Filled 2020-01-28: qty 1

## 2020-01-28 NOTE — Discharge Summary (Signed)
Physician Discharge Summary  Patient ID: [name] Little MRN: [id number] DOB/AGE: [birth year] 86 y.o.  Admit date: 01/27/2020 Discharge date: 01/28/2020  Admission Diagnoses: Right Urolithiasis, Bacteruria  Discharge Diagnoses:  Active Problems:   Ureteral stone with hydronephrosis   Discharged Condition: good  Hospital Course: Pt underwent urgent RIGHT ureteral stent / cystoscopy 12/28 for multifocal Rt ureteral / renal stones in setting of bacteruria and some subjective infectious parameters. She was given Gentamicin, then rocephin post-op. Prior UCX e. Coli sens cephs, new UCX 12/28 pending. By the afternoon of POD 1, she is ambulatory, tollerating PO intake, pain controlled, afebrile since admission, and felt to be adequate for discharge. Precuations discussed with pt and daughter.   Consults: None  Significant Diagnostic Studies: labs: as per above  Treatments: surgery: as per above  Discharge Exam: Blood pressure 132/62, pulse (!) 57, temperature 98.2 F (36.8 C), resp. rate 18, height 5\' 3"  (1.6 m), weight 75.2 kg, SpO2 93 %. General appearance: alert, cooperative and In good spirits, AOx3, daughter at bedside.  Eyes: negative Nose: Nares normal. Septum midline. Mucosa normal. No drainage or sinus tenderness. Throat: lips, mucosa, and tongue normal; teeth and gums normal Neck: supple, symmetrical, trachea midline Back: symmetric, no curvature. ROM normal. No CVA tenderness. Resp: Non-labored on room air.  Cardio: Nl rate GI: soft, non-tender; bowel sounds normal; no masses,  no organomegaly Extremities: extremities normal, atraumatic, no cyanosis or edema Skin: Skin color, texture, turgor normal. No rashes or lesions Lymph nodes: Cervical, supraclavicular, and axillary nodes normal. Neurologic: Grossly normal  Disposition:      Follow-up Information    , MD.   Specialty: Urology Why: Office will call to arange next stage kidney stone surgery  for about 2-3 weeks form now.  Contact information: [address] [place] [phone]               Signed: [phone] 01/28/2020, 2:03 PM

## 2020-01-28 NOTE — Progress Notes (Signed)
Pt discharged home today per Dr. Berneice Heinrich. Pt's IV site D/C'd and WDL. Pt's VSS. Pt provided with home medication list, discharge instructions and prescriptions. Verbalized understanding. Pt left floor via WC in stable condition accompanied by RN.

## 2020-01-28 NOTE — Progress Notes (Signed)
Pharmacy Antibiotic Note  [name] Little is a 86 y.o. female admitted on 01/27/2020 with pyelonephritis  Pharmacy has been consulted for gentamicin dosing.  Gentamicin 80mg  given x 1 on 12/28 @ 1900  Plan: Gentamicin 7mg /kg ABW 430mg  x 1 at 0700 Gent level 13 hours later   Height: 5\' 3"  (160 cm) Weight: 75.2 kg (165 lb 12.8 oz) IBW/kg (Calculated) : 52.4  Temp (24hrs), Avg:97.9 F (36.6 C), Min:97.2 F (36.2 C), Max:98.8 F (37.1 C)  Recent Labs  Lab 01/27/20 1800  CREATININE 1.03*    Estimated Creatinine Clearance: 39.5 mL/min (A) (by C-G formula based on SCr of 1.03 mg/dL (H)).    Allergies  Allergen Reactions  . Hydralazine Other (See Comments)    Joint and chest pain  . Amlodipine Swelling    "my whole body swelled"  . Carvedilol Swelling    "my whole body swelled"  . Cefuroxime Axetil     REACTION: Ddoes not remember reaction  . Chlorthalidone     Patient reported extreme weakness.  . Statins Other (See Comments)    Weakness, pain Lipitor and Pravachol Weakness, pain Lipitor and Pravachol  . Sulfonamide Derivatives     REACTION: Rash     Thank you for allowing pharmacy to be a part of this patient's care.  RPh 01/28/2020, 2:04 AM

## 2020-01-28 NOTE — Plan of Care (Signed)
  Problem: Education: Goal: Knowledge of General Education information will improve Description: Including pain rating scale, medication(s)/side effects and non-pharmacologic comfort measures Outcome: Completed/Met   Problem: Clinical Measurements: Goal: Ability to maintain clinical measurements within normal limits will improve Outcome: Completed/Met Goal: Diagnostic test results will improve Outcome: Completed/Met Goal: Respiratory complications will improve Outcome: Completed/Met Goal: Cardiovascular complication will be avoided Outcome: Completed/Met   Problem: Activity: Goal: Risk for activity intolerance will decrease Outcome: Completed/Met   Problem: Nutrition: Goal: Adequate nutrition will be maintained Outcome: Completed/Met   Problem: Coping: Goal: Level of anxiety will decrease Outcome: Completed/Met   Problem: Elimination: Goal: Will not experience complications related to bowel motility Outcome: Completed/Met Goal: Will not experience complications related to urinary retention Outcome: Completed/Met   Problem: Pain Managment: Goal: General experience of comfort will improve Outcome: Completed/Met   Problem: Safety: Goal: Ability to remain free from injury will improve Outcome: Completed/Met   Problem: Skin Integrity: Goal: Risk for impaired skin integrity will decrease Outcome: Completed/Met   Problem: Urinary Elimination: Goal: Signs and symptoms of infection will decrease Outcome: Adequate for Discharge

## 2020-01-28 NOTE — Discharge Instructions (Signed)
1 - You may have urinary urgency (bladder spasms) and bloody urine on / off with stent in place. This is normal. ° °2 - Call MD or go to ER for fever >102, severe pain / nausea / vomiting not relieved by medications, or acute change in medical status ° °

## 2020-01-28 NOTE — Progress Notes (Signed)
Pharmacy Antibiotic Note  [name] Little is a 86 y.o. female admitted on 01/27/2020 with pyelonephritis.  Currently on gentamicin post op. Cefuroxime allergy is unknown to patient and has been greater than 10 years. Has tolerated Zosyn and Keflex more recently.   Plan: Stop gentamicin Start ceftriaxone 2g IV Q24h F/u cx results    Height: 5\' 3"  (160 cm) Weight: 75.2 kg (165 lb 12.8 oz) IBW/kg (Calculated) : 52.4  Temp (24hrs), Avg:98.2 F (36.8 C), Min:97.2 F (36.2 C), Max:98.8 F (37.1 C)  Recent Labs  Lab 01/27/20 1800  CREATININE 1.03*    Estimated Creatinine Clearance: 39.5 mL/min (A) (by C-G formula based on SCr of 1.03 mg/dL (H)).    Allergies  Allergen Reactions  . Hydralazine Other (See Comments)    Joint and chest pain  . Amlodipine Swelling    "my whole body swelled"  . Carvedilol Swelling    "my whole body swelled"  . Cefuroxime Axetil     REACTION: Ddoes not remember reaction  . Chlorthalidone     Patient reported extreme weakness.  . Statins Other (See Comments)    Weakness, pain Lipitor and Pravachol Weakness, pain Lipitor and Pravachol  . Sulfonamide Derivatives     REACTION: Rash    Thank you for allowing pharmacy to be a part of this patient's care.  01/29/20, PharmD, BCPS, BCIDP Clinical Pharmacist 01/28/2020 9:04 AM

## 2020-01-28 NOTE — Plan of Care (Signed)
  Problem: Education: Goal: Knowledge of General Education information will improve Description: Including pain rating scale, medication(s)/side effects and non-pharmacologic comfort measures Outcome: Completed/Met   Problem: Health Behavior/Discharge Planning: Goal: Ability to manage health-related needs will improve Outcome: Completed/Met   Problem: Clinical Measurements: Goal: Ability to maintain clinical measurements within normal limits will improve Outcome: Completed/Met Goal: Will remain free from infection Outcome: Completed/Met Goal: Diagnostic test results will improve Outcome: Completed/Met Goal: Respiratory complications will improve Outcome: Completed/Met Goal: Cardiovascular complication will be avoided Outcome: Completed/Met   Problem: Activity: Goal: Risk for activity intolerance will decrease Outcome: Completed/Met   Problem: Nutrition: Goal: Adequate nutrition will be maintained Outcome: Completed/Met   Problem: Coping: Goal: Level of anxiety will decrease Outcome: Completed/Met   Problem: Elimination: Goal: Will not experience complications related to bowel motility Outcome: Completed/Met Goal: Will not experience complications related to urinary retention Outcome: Completed/Met   Problem: Pain Managment: Goal: General experience of comfort will improve Outcome: Completed/Met   Problem: Safety: Goal: Ability to remain free from injury will improve Outcome: Completed/Met   Problem: Skin Integrity: Goal: Risk for impaired skin integrity will decrease Outcome: Completed/Met   Problem: Urinary Elimination: Goal: Signs and symptoms of infection will decrease Outcome: Adequate for Discharge

## 2020-01-29 LAB — URINE CULTURE: Culture: NO GROWTH

## 2020-01-29 NOTE — Op Note (Signed)
NAME: [name] Little, [name] Little MEDICAL RECORD [id number] ACCOUNT [id number] DATE OF BIRTH:1933-03-10 FACILITY: [facility] LOCATION: [facility] PHYSICIAN:Phelix Fudala, MD  OPERATIVE REPORT  DATE OF PROCEDURE:  01/27/2020  PREOPERATIVE DIAGNOSES:  Right ureteral stone, pyelonephritis.  PROCEDURES: 1.  Cystoscopy, retrograde pyelogram with interpretation. 2.  Right ureteral stent placement 5 x 24 Polaris, no tether.  ESTIMATED BLOOD LOSS:  Nil.  COMPLICATIONS:  None.  SPECIMENS:  Right renal pelvis urine for Gram stain and culture.  FINDINGS: 1.  Moderate hydroureteronephrosis to the distal ureter consistent with known stone. 2.  Successful placement of right ureteral stent, proximal end in renal pelvis, distal end in urinary bladder. 3.  Very concentrated and proteinaceous appearing urine.  INDICATIONS:  The patient is a pleasant 86 year old woman with history of factor V Leiden, on chronic anticoagulation, who for several months now has had an ongoing bout of urolithiasis.  She has passed multiple stones during this time.  Her colic has  been manageable, but then flares.  She had return of colic symptoms, but also new fevers and malaise and large bacteria in the office today worrisome for possible infection plus stone.  She underwent CT imaging, which corroborated distal ureteral stone,  multifocal renal stones, and overall clinical picture was consistent with early obstructing pyelonephritis.  Options were discussed for management including recommended path of urgent renal decompression followed by intravenous antibiotics with plan for  clearance of infectious parameters before definitive stone management with ureteroscopy, and she wished to proceed.  Informed consent was obtained and placed in medical record.  DESCRIPTION OF PROCEDURE:  The patient being [name] Little, procedure being right ureteral stent placement was confirmed.  Procedure timeout was performed.  Intravenous antibiotics were  administered.  General anesthesia was induced.  The patient was  placed into a low lithotomy position.  Sterile field was created, prepped and draped the patient's vagina, introitus, and proximal thighs using iodine.  Cystourethroscopy was performed using 22-French cystoscope with offset lens.  Inspection of bladder  revealed no diverticula, calcifications, or papillary lesions.  The urine was quite concentrated.  The right ureteral orifice was cannulated with a 6-French renal catheter, and right retrograde pyelogram was obtained.  Right retrograde pyelogram demonstrated a single right ureter, single system right kidney.  There was moderate tortuosity, mild hydronephrosis of the lower kidney and a mobile filling defect in distal ureter consistent with known stone.  A 0.03 ZIPwire  was advanced to lower pole, and the open-ended catheter was advanced and hydronephrotic drip.  Specimen was obtained, labeled right renal pelvis urine, and the ZIPwire was once again advanced, and the open-ended catheter was exchanged for a 5 x 24  Polaris-type stent using fluoroscopic guidance.  Good proximal and distal planes were noted.  Bladder was empty per cystoscope.  Procedure was then terminated.  The patient tolerated the procedure well.  No immediate complications.  The patient was taken  to the postanesthesia care unit in stable condition.  Plan for observation admission.  Discharge home after afebrile for 12-24 hours.  As she remained hemodynamically significant without any significant tachycardia, decision was made not to place catheter at the end of the case.  IN/NUANCE  D:01/27/2020 T:01/28/2020 JOB:[id number]

## 2020-02-02 LAB — ANAEROBIC CULTURE

## 2020-02-03 ENCOUNTER — Other Ambulatory Visit: Payer: Self-pay | Admitting: Urology

## 2020-02-03 ENCOUNTER — Telehealth: Payer: Self-pay | Admitting: Family Medicine

## 2020-02-03 NOTE — Telephone Encounter (Signed)
   Patient reports she was recently discharged from the hospital, she has questions about adjusting warfarin (COUMADIN) 5 MG tablet  Please call

## 2020-02-09 DIAGNOSIS — N201 Calculus of ureter: Secondary | ICD-10-CM | POA: Diagnosis not present

## 2020-02-09 DIAGNOSIS — R8271 Bacteriuria: Secondary | ICD-10-CM | POA: Diagnosis not present

## 2020-02-09 DIAGNOSIS — N23 Unspecified renal colic: Secondary | ICD-10-CM | POA: Diagnosis not present

## 2020-02-09 DIAGNOSIS — R310 Gross hematuria: Secondary | ICD-10-CM | POA: Diagnosis not present

## 2020-02-10 ENCOUNTER — Ambulatory Visit: Payer: Medicare PPO

## 2020-02-23 ENCOUNTER — Encounter (HOSPITAL_BASED_OUTPATIENT_CLINIC_OR_DEPARTMENT_OTHER): Payer: Self-pay | Admitting: Urology

## 2020-02-23 ENCOUNTER — Other Ambulatory Visit: Payer: Self-pay

## 2020-02-23 ENCOUNTER — Other Ambulatory Visit (HOSPITAL_COMMUNITY)
Admission: RE | Admit: 2020-02-23 | Discharge: 2020-02-23 | Disposition: A | Discharge status: Home / Self Care | Payer: Medicare PPO | Source: Ambulatory Visit | Attending: Urology | Admitting: Urology

## 2020-02-23 DIAGNOSIS — Z01812 Encounter for preprocedural laboratory examination: Secondary | ICD-10-CM | POA: Insufficient documentation

## 2020-02-23 DIAGNOSIS — Z20822 Contact with and (suspected) exposure to covid-19: Secondary | ICD-10-CM | POA: Diagnosis not present

## 2020-02-23 LAB — SARS CORONAVIRUS 2 (TAT 6-24 HRS): SARS Coronavirus 2: NEGATIVE

## 2020-02-23 NOTE — Progress Notes (Signed)
Spoke w/ via phone for pre-op interview--- PT Lab needs dos----  Istat, PT/INR             Lab results------ current ekg in epic/ chart COVID test ------ done 02-23-2020 result in epic Arrive at ------- 1130 on 02-25-2020 NPO after MN NO Solid Food.  Clear liquids from MN until--- 1030 Medications to take morning of surgery ----- NONE Diabetic medication ----- n/a Patient Special Instructions ----- n/a Pre-Op special Istructions ----- per dr Tresa Moore in special needs pt to continue coumadin  Patient verbalized understanding of instructions that were given at this phone interview. Patient denies shortness of breath, chest pain, fever, cough at this phone interview.   Anesthesia :  HTN;  Mild to moderate MR w/o stenosis;  Hx PSVT (pt stated no symptoms in several years);  Factor V def. , chronic anticoagulated on coumadin (hx dvt/ pe , none since 2006;  Hx ITP s/p splenectomy 1998;  Diet controlled DM2;  Pt denies any cardiac s&s, sob and no peripheral swelling  PCP:  Dr Etter Sjogren (lov 12-19-2019 epic) Cardiologist : Dr Johnsie Cancel (lov 11-05-2019 epic) Coumadin clinic lov in epic Chest x-ray : CT angio 11-29-2016 epic EKG : 11-05-2019  epic Echo :  08-24-2016 epic Stress test:  2010 per cardiology note nuclear study with no ischemia and normal lvf Cardiac Cath :  no Activity level:  Denies sob w/ any activity Sleep Study/ CPAP :  NO Fasting Blood Sugar :      / Checks Blood Sugar -- times a day:  Occasionally checks Blood Thinner/ Instructions /Last Dose: Coumadin ASA / Instructions/ Last Dose : ASA 81mg  Per pt was told by dr Tresa Moore not to stop coumadin prior to surgery

## 2020-02-25 ENCOUNTER — Encounter (HOSPITAL_BASED_OUTPATIENT_CLINIC_OR_DEPARTMENT_OTHER)
Admission: RE | Disposition: A | Discharge status: Home / Self Care | Payer: Self-pay | Source: Home / Self Care | Attending: Urology

## 2020-02-25 ENCOUNTER — Encounter (HOSPITAL_BASED_OUTPATIENT_CLINIC_OR_DEPARTMENT_OTHER): Payer: Self-pay | Admitting: Urology

## 2020-02-25 ENCOUNTER — Ambulatory Visit (HOSPITAL_BASED_OUTPATIENT_CLINIC_OR_DEPARTMENT_OTHER): Payer: Medicare PPO | Admitting: Anesthesiology

## 2020-02-25 ENCOUNTER — Other Ambulatory Visit: Payer: Self-pay

## 2020-02-25 ENCOUNTER — Ambulatory Visit (HOSPITAL_BASED_OUTPATIENT_CLINIC_OR_DEPARTMENT_OTHER)
Admission: RE | Admit: 2020-02-25 | Discharge: 2020-02-25 | Disposition: A | Discharge status: Home / Self Care | Payer: Medicare PPO | Attending: Urology | Admitting: Urology

## 2020-02-25 DIAGNOSIS — Z7901 Long term (current) use of anticoagulants: Secondary | ICD-10-CM | POA: Diagnosis not present

## 2020-02-25 DIAGNOSIS — Z888 Allergy status to other drugs, medicaments and biological substances status: Secondary | ICD-10-CM | POA: Insufficient documentation

## 2020-02-25 DIAGNOSIS — Z853 Personal history of malignant neoplasm of breast: Secondary | ICD-10-CM | POA: Insufficient documentation

## 2020-02-25 DIAGNOSIS — N201 Calculus of ureter: Secondary | ICD-10-CM | POA: Diagnosis not present

## 2020-02-25 DIAGNOSIS — I700 Atherosclerosis of aorta: Secondary | ICD-10-CM | POA: Diagnosis not present

## 2020-02-25 DIAGNOSIS — Z882 Allergy status to sulfonamides status: Secondary | ICD-10-CM | POA: Insufficient documentation

## 2020-02-25 DIAGNOSIS — Z881 Allergy status to other antibiotic agents status: Secondary | ICD-10-CM | POA: Insufficient documentation

## 2020-02-25 DIAGNOSIS — N132 Hydronephrosis with renal and ureteral calculous obstruction: Secondary | ICD-10-CM | POA: Diagnosis not present

## 2020-02-25 DIAGNOSIS — N202 Calculus of kidney with calculus of ureter: Secondary | ICD-10-CM | POA: Diagnosis not present

## 2020-02-25 DIAGNOSIS — Z466 Encounter for fitting and adjustment of urinary device: Secondary | ICD-10-CM | POA: Diagnosis not present

## 2020-02-25 DIAGNOSIS — I340 Nonrheumatic mitral (valve) insufficiency: Secondary | ICD-10-CM | POA: Diagnosis not present

## 2020-02-25 DIAGNOSIS — D682 Hereditary deficiency of other clotting factors: Secondary | ICD-10-CM | POA: Insufficient documentation

## 2020-02-25 HISTORY — DX: Type 2 diabetes mellitus without complications: E11.9

## 2020-02-25 HISTORY — DX: Aneurysm of the ascending aorta, without rupture: I71.21

## 2020-02-25 HISTORY — DX: Personal history of malignant melanoma of skin: Z85.820

## 2020-02-25 HISTORY — DX: Personal history of diseases of the blood and blood-forming organs and certain disorders involving the immune mechanism: Z86.2

## 2020-02-25 HISTORY — DX: Unspecified macular degeneration: H35.30

## 2020-02-25 HISTORY — DX: Cardiac murmur, unspecified: R01.1

## 2020-02-25 HISTORY — DX: Calculus of ureter: N20.1

## 2020-02-25 HISTORY — PX: CYSTOSCOPY WITH RETROGRADE PYELOGRAM, URETEROSCOPY AND STENT PLACEMENT: SHX5789

## 2020-02-25 HISTORY — DX: Personal history of urinary calculi: Z87.442

## 2020-02-25 HISTORY — DX: Personal history of other venous thrombosis and embolism: Z86.718

## 2020-02-25 HISTORY — DX: Personal history of other infectious and parasitic diseases: Z86.19

## 2020-02-25 HISTORY — PX: HOLMIUM LASER APPLICATION: SHX5852

## 2020-02-25 HISTORY — DX: Calculus of kidney: N20.0

## 2020-02-25 HISTORY — DX: Generalized anxiety disorder: F41.1

## 2020-02-25 HISTORY — DX: Personal history of other malignant neoplasm of skin: Z85.828

## 2020-02-25 HISTORY — DX: Long term (current) use of anticoagulants: Z79.01

## 2020-02-25 HISTORY — DX: Thoracic aortic aneurysm, without rupture: I71.2

## 2020-02-25 HISTORY — DX: Unspecified osteoarthritis, unspecified site: M19.90

## 2020-02-25 HISTORY — DX: Personal history of other diseases of the circulatory system: Z86.79

## 2020-02-25 HISTORY — DX: Personal history of other malignant neoplasm of skin: Z98.890

## 2020-02-25 LAB — POCT I-STAT, CHEM 8
BUN: 35 mg/dL — ABNORMAL HIGH (ref 8–23)
Calcium, Ion: 1.2 mmol/L (ref 1.15–1.40)
Chloride: 103 mmol/L (ref 98–111)
Creatinine, Ser: 1 mg/dL (ref 0.44–1.00)
Glucose, Bld: 117 mg/dL — ABNORMAL HIGH (ref 70–99)
HCT: 45 % (ref 36.0–46.0)
Hemoglobin: 15.3 g/dL — ABNORMAL HIGH (ref 12.0–15.0)
Potassium: 4.5 mmol/L (ref 3.5–5.1)
Sodium: 140 mmol/L (ref 135–145)
TCO2: 26 mmol/L (ref 22–32)

## 2020-02-25 LAB — PROTIME-INR
INR: 2.2 — ABNORMAL HIGH (ref 0.8–1.2)
Prothrombin Time: 23.2 seconds — ABNORMAL HIGH (ref 11.4–15.2)

## 2020-02-25 SURGERY — CYSTOURETEROSCOPY, WITH RETROGRADE PYELOGRAM AND STENT INSERTION
Anesthesia: General | Site: Ureter | Laterality: Right

## 2020-02-25 MED ORDER — FENTANYL CITRATE (PF) 100 MCG/2ML IJ SOLN
INTRAMUSCULAR | Status: AC
  Filled 2020-02-25: qty 2

## 2020-02-25 MED ORDER — ACETAMINOPHEN 500 MG PO TABS
ORAL_TABLET | ORAL | Status: AC
  Filled 2020-02-25: qty 2

## 2020-02-25 MED ORDER — LIDOCAINE HCL (PF) 2 % IJ SOLN
INTRAMUSCULAR | Status: AC
  Filled 2020-02-25: qty 5

## 2020-02-25 MED ORDER — LIDOCAINE 2% (20 MG/ML) 5 ML SYRINGE
INTRAMUSCULAR | Status: DC | PRN
  Administered 2020-02-25: 80 mg via INTRAVENOUS

## 2020-02-25 MED ORDER — LACTATED RINGERS IV SOLN: INTRAVENOUS | Status: DC

## 2020-02-25 MED ORDER — ONDANSETRON HCL 4 MG/2ML IJ SOLN
INTRAMUSCULAR | Status: AC
  Filled 2020-02-25: qty 2

## 2020-02-25 MED ORDER — PROPOFOL 10 MG/ML IV BOLUS
INTRAVENOUS | Status: AC
  Filled 2020-02-25: qty 20

## 2020-02-25 MED ORDER — ONDANSETRON HCL 4 MG/2ML IJ SOLN
INTRAMUSCULAR | Status: DC | PRN
  Administered 2020-02-25: 4 mg via INTRAVENOUS

## 2020-02-25 MED ORDER — SODIUM CHLORIDE 0.9 % IR SOLN
Status: DC | PRN
  Administered 2020-02-25: 6000 mL

## 2020-02-25 MED ORDER — CEPHALEXIN 500 MG PO CAPS: 500.0000 mg | ORAL_CAPSULE | Freq: Two times a day (BID) | ORAL | 0 refills | Status: AC

## 2020-02-25 MED ORDER — 0.9 % SODIUM CHLORIDE (POUR BTL) OPTIME
TOPICAL | Status: DC | PRN
  Administered 2020-02-25: 500 mL

## 2020-02-25 MED ORDER — TRAMADOL HCL 50 MG PO TABS: 50.0000 mg | ORAL_TABLET | Freq: Four times a day (QID) | ORAL | 0 refills | Status: DC | PRN

## 2020-02-25 MED ORDER — FENTANYL CITRATE (PF) 100 MCG/2ML IJ SOLN
INTRAMUSCULAR | Status: DC | PRN
  Administered 2020-02-25 (×2): 25 ug via INTRAVENOUS
  Administered 2020-02-25: 50 ug via INTRAVENOUS

## 2020-02-25 MED ORDER — GENTAMICIN SULFATE 40 MG/ML IJ SOLN
5.0000 mg/kg | INTRAVENOUS | Status: AC
  Administered 2020-02-25: 380 mg via INTRAVENOUS
  Filled 2020-02-25: qty 9.5

## 2020-02-25 MED ORDER — ACETAMINOPHEN 500 MG PO TABS
1000.0000 mg | ORAL_TABLET | Freq: Once | ORAL | Status: AC
  Administered 2020-02-25: 1000 mg via ORAL

## 2020-02-25 MED ORDER — DEXAMETHASONE SODIUM PHOSPHATE 10 MG/ML IJ SOLN
INTRAMUSCULAR | Status: AC
  Filled 2020-02-25: qty 1

## 2020-02-25 MED ORDER — DEXAMETHASONE SODIUM PHOSPHATE 10 MG/ML IJ SOLN
INTRAMUSCULAR | Status: DC | PRN
  Administered 2020-02-25: 10 mg via INTRAVENOUS

## 2020-02-25 MED ORDER — PROPOFOL 10 MG/ML IV BOLUS
INTRAVENOUS | Status: DC | PRN
  Administered 2020-02-25: 130 mg via INTRAVENOUS

## 2020-02-25 MED ORDER — FENTANYL CITRATE (PF) 100 MCG/2ML IJ SOLN
25.0000 ug | INTRAMUSCULAR | Status: DC | PRN
  Administered 2020-02-25 (×2): 25 ug via INTRAVENOUS

## 2020-02-25 MED ORDER — IOHEXOL 300 MG/ML  SOLN
INTRAMUSCULAR | Status: DC | PRN
  Administered 2020-02-25: 15 mL via URETHRAL

## 2020-02-25 SURGICAL SUPPLY — 26 items
BAG DRAIN URO-CYSTO SKYTR STRL (DRAIN) ×2 IMPLANT
BAG DRN UROCATH (DRAIN) ×1
BASKET LASER NITINOL 1.9FR (BASKET) ×1 IMPLANT
BSKT STON RTRVL 120 1.9FR (BASKET) ×1
CATH INTERMIT  6FR 70CM (CATHETERS) IMPLANT
CLOTH BEACON ORANGE TIMEOUT ST (SAFETY) ×2 IMPLANT
FIBER LASER FLEXIVA 365 (UROLOGICAL SUPPLIES) IMPLANT
GLOVE BIO SURGEON STRL SZ7.5 (GLOVE) ×2 IMPLANT
GOWN STRL REUS W/TWL LRG LVL3 (GOWN DISPOSABLE) ×2 IMPLANT
GUIDEWIRE ANG ZIPWIRE 038X150 (WIRE) ×2 IMPLANT
GUIDEWIRE STR DUAL SENSOR (WIRE) ×2 IMPLANT
IV NS 1000ML (IV SOLUTION) ×2
IV NS 1000ML BAXH (IV SOLUTION) ×1 IMPLANT
IV NS IRRIG 3000ML ARTHROMATIC (IV SOLUTION) ×2 IMPLANT
KIT TURNOVER CYSTO (KITS) ×2 IMPLANT
MANIFOLD NEPTUNE II (INSTRUMENTS) ×2 IMPLANT
NS IRRIG 500ML POUR BTL (IV SOLUTION) ×2 IMPLANT
PACK CYSTO (CUSTOM PROCEDURE TRAY) ×2 IMPLANT
SHEATH URETERAL 12FRX28CM (UROLOGICAL SUPPLIES) ×1 IMPLANT
STENT POLARIS 5FRX22 (STENTS) ×1 IMPLANT
SYR 10ML LL (SYRINGE) ×2 IMPLANT
TRACTIP FLEXIVA PULS ID 200XHI (Laser) IMPLANT
TRACTIP FLEXIVA PULSE ID 200 (Laser) ×2
TUBE CONNECTING 12X1/4 (SUCTIONS) ×2 IMPLANT
TUBE FEEDING 8FR 16IN STR KANG (MISCELLANEOUS) IMPLANT
TUBING UROLOGY SET (TUBING) ×2 IMPLANT

## 2020-02-25 NOTE — Transfer of Care (Signed)
Immediate Anesthesia Transfer of Care Note  Patient: [name] Little  Procedure(s) Performed: CYSTOSCOPY WITH RETROGRADE PYELOGRAM, URETEROSCOPY AND STENT EXCHANGE (Right Ureter) HOLMIUM LASER APPLICATION (Right Ureter)  Patient Location: PACU  Anesthesia Type:General  Level of Consciousness: awake, alert  and oriented  Airway & Oxygen Therapy: Patient Spontanous Breathing and Patient connected to face mask oxygen  Post-op Assessment: Post -op Vital signs reviewed and stable  Post vital signs: Reviewed and stable  Last Vitals:  Vitals Value Taken Time  BP 122/78   Temp    Pulse 98 02/25/20 1448  Resp 13 02/25/20 1448  SpO2 100 % 02/25/20 1448  Vitals shown include unvalidated device data.  Last Pain:  Vitals:   02/25/20 1044  TempSrc: Oral  PainSc: 0-No pain      Patients Stated Pain Goal: 6 (30/94/07 6808)  Complications: No complications documented.

## 2020-02-25 NOTE — Anesthesia Preprocedure Evaluation (Addendum)
Anesthesia Evaluation  Patient identified by MRN, date of birth, ID band Patient awake    Reviewed: Allergy & Precautions, NPO status , Patient's Chart, lab work & pertinent test results  Airway Mallampati: II  TM Distance: >3 FB Neck ROM: Full    Dental no notable dental hx. (+) Teeth Intact, Dental Advisory Given   Pulmonary PE   Pulmonary exam normal breath sounds clear to auscultation       Cardiovascular hypertension, Pt. on medications + DVT  Normal cardiovascular exam+ dysrhythmias Supra Ventricular Tachycardia + Valvular Problems/Murmurs MR  Rhythm:Regular Rate:Normal  Thoracic ascending aortic aneurysm  TTE 2018 - Left ventricle: The cavity size was normal. There was mild  concentric hypertrophy. Systolic function was normal. The  estimated ejection fraction was in the range of 55% to 60%. Wall  motion was normal; there were no regional wall motion  abnormalities.  - Aortic root: The aortic root was mildly dilated.  - Mitral valve: There was mild to moderate regurgitation.  - Left atrium: The atrium was mildly dilated.   Neuro/Psych PSYCHIATRIC DISORDERS Anxiety negative neurological ROS     GI/Hepatic negative GI ROS, Neg liver ROS,   Endo/Other  negative endocrine ROSdiabetes, Well Controlled  Renal/GU negative Renal ROS  negative genitourinary   Musculoskeletal  (+) Arthritis ,   Abdominal   Peds  Hematology  (+) Blood dyscrasia (Factor V Leiden on coumadin; ITP s/p splenectomy), ,   Anesthesia Other Findings   Reproductive/Obstetrics                            Anesthesia Physical Anesthesia Plan  ASA: III  Anesthesia Plan: General   Post-op Pain Management:    Induction: Intravenous  PONV Risk Score and Plan: 3 and Ondansetron, Dexamethasone and Treatment may vary due to age or medical condition  Airway Management Planned: LMA  Additional Equipment:    Intra-op Plan:   Post-operative Plan: Extubation in OR  Informed Consent: I have reviewed the patients History and Physical, chart, labs and discussed the procedure including the risks, benefits and alternatives for the proposed anesthesia with the patient or authorized representative who has indicated his/her understanding and acceptance.     Dental advisory given  Plan Discussed with: CRNA  Anesthesia Plan Comments:         Anesthesia Quick Evaluation

## 2020-02-25 NOTE — H&P (Signed)
[name] Little is an 87 y.o. female.    Chief Complaint: Pre-Op RIGHT Ureteroscopic Stone Manipulation  HPI:    1- Recurrent Urolithiasis - Pt passing multifocal Rt ureteral stones since 08/2019, CT 01/27/20 with Rt UVJ stone + residual multifocal renal stones + hydro. Treated with RIGHT stent 01/27/20.  2 - Obstructing Pyelonephritis - Rt hydro to distal ureter, large bacteruria, subjective fevers / malaise. UCX e. coli sens gent, cipro, keflex. Treated with IV bridged to PO ABX   PMH sig for breast CA, F5Leiden ([facility] at [facility]), Splenectomy.  Today "[name] Little" is seen to proceed with RIGHT ureteroscopc stone manipulation with goal of stone free. No interval fevers. C19 screen negative. Most recetn UCX with stable e. Coli colonization, most recentl on CX-specific keflex.    Past Medical History:  Diagnosis Date  . Anticoagulated on Coumadin    long-term for factor V---  managed by coumadin clinic and pcp  . Diet-controlled type 2 diabetes mellitus ([place])    followed by pcp  . 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 GAD (generalized anxiety disorder)   . Heart murmur   . Hemorrhoids   . History of avascular necrosis of capital femoral epiphysis 1999   s/p THA right  . History of basal cell carcinoma (BCC) excision    scalp/ neck  . History of Clostridium difficile infection   . History of DVT of lower extremity 1998;  04/ 2006   LLE  . History of external beam radiation therapy 2010   right breast cancer  . History of ITP    per pt hx long-term use prednisone for ITP,  1998 s/p total splenctomy  . History of kidney stones   . History of melanoma 1980s   s/p  right calf melanoma excision , per pt not malignant and localized ,  no recurrence  . History of paroxysmal supraventricular tachycardia    (02-23-2020  pt stated have not had any issues / symptoms in several years  . History of pulmonary embolus (PE) 04/2004  . History of right  breast cancer 2010   08-10-2008 s/p  right lumpectomy w/ sln bx (partial mastectomy) , ER+, Stage I,  completed radiation 2010,  per pt no recurrance  . Hypertension   . Macular degeneration, bilateral    mild  . Mitral valve regurgitation 09/01/2016   cardiology-- dr Johnsie Cancel--- per note mild to moderate without stenosis,    . Mixed hyperlipidemia 05/18/2015  . Nocturia   . OA (osteoarthritis)    lumbar stenosis, radiculopathy, OA- L hip  . Personal history of colonic polyps    hyperplastic  . Renal calculus, right   . Right ureteral calculus   . Thoracic ascending aortic aneurysm [facility])    last CT angio chest in epic 11-29-2016  , 3.7cm    Past Surgical History:  Procedure Laterality Date  . ABDOMINAL HYSTERECTOMY  1973  . BREAST LUMPECTOMY WITH RADIOACTIVE SEED AND SENTINEL LYMPH NODE BIOPSY Right 08-10-2008  @[facility]   . CATARACT EXTRACTION W/ INTRAOCULAR LENS  IMPLANT, BILATERAL Bilateral 2001  . CYSTOSCOPY WITH STENT PLACEMENT Right 01/27/2020   Procedure: CYSTOSCOPY WITH STENT PLACEMENT;  Surgeon: Alexis Frock, MD;  Location: [facility];  Service: Urology;  Laterality: Right;  . CYSTOSCOPY/URETEROSCOPY/HOLMIUM LASER/STENT PLACEMENT Left 06-27-2018  dr Amalia Hailey,  @[facility]   . I & D EXTREMITY Right 10/26/2017   Procedure: IRRIGATION AND DEBRIDEMENT RIGHT HAND;  Surgeon: Iran Planas, MD;  Location: [place];  Service:  Orthopedics;  Laterality: Right;  . LUMBAR LAMINECTOMY/DECOMPRESSION MICRODISCECTOMY  04/11/2011   Procedure: LUMBAR LAMINECTOMY/DECOMPRESSION MICRODISCECTOMY;  Surgeon: [name], MD;  Location: [place] [facility];  Service: Neurosurgery;  Laterality: N/A;  Lumbar Five-Sacral One Microdiscectomy  . MELANOMA EXCISION Right 1980s   right calf  . SPLENECTOMY, TOTAL  1998  . TOTAL HIP ARTHROPLASTY Right 1999  . UPPER GASTROINTESTINAL ENDOSCOPY      Family History  Problem Relation Age of Onset  . Heart disease Mother   . Heart disease Father   . Alzheimer's disease Sister   .  Dementia Sister   . Dementia Sister   . Heart disease Other        uncles  . Clotting disorder Maternal Uncle   . Breast cancer Paternal Aunt   . Cancer Paternal Aunt        breast  . Stroke Sister   . Dementia Sister        mean, carotid artery disease  . Colon cancer Neg Hx   . Anesthesia problems Neg Hx   . Hypotension Neg Hx   . Malignant hyperthermia Neg Hx   . Pseudochol deficiency Neg Hx    Social History:  reports that she has never smoked. She has never used smokeless tobacco. She reports that she does not drink alcohol and does not use drugs.  Allergies:  Allergies  Allergen Reactions  . Hydralazine Other (See Comments)    Joint and chest pain  . Amlodipine Swelling    "my whole body swelled"  . Carvedilol Swelling    "my whole body swelled"  . Cefuroxime Axetil     REACTION: Ddoes not remember reaction  . Chlorthalidone     Patient reported extreme weakness.  . Statins Other (See Comments)    Weakness, pain Lipitor and Pravachol Weakness, pain Lipitor and Pravachol  . Sulfonamide Derivatives Rash    REACTION: Rash    No medications prior to admission.    Results for orders placed or performed during the hospital encounter of 02/23/20 (from the past 48 hour(s))  SARS CORONAVIRUS 2 (TAT 6-24 HRS) Nasopharyngeal Nasopharyngeal Swab     Status: None   Collection Time: 02/23/20 10:09 AM   Specimen: Nasopharyngeal Swab  Result Value Ref Range   SARS Coronavirus 2 NEGATIVE NEGATIVE    Comment: (NOTE) SARS-CoV-2 target nucleic acids are NOT DETECTED.  The SARS-CoV-2 RNA is generally detectable in upper and lower respiratory specimens during the acute phase of infection. Negative results do not preclude SARS-CoV-2 infection, do not rule out co-infections with other pathogens, and should not be used as the sole basis for treatment or other patient management decisions. Negative results must be combined with clinical observations, patient history, and  epidemiological information. The expected result is Negative.  Fact Sheet for Patients: SugarRoll.be  Fact Sheet for Healthcare Providers: https://www.woods-mathews.com/  This test is not yet approved or cleared by the [place] FDA and  has been authorized for detection and/or diagnosis of SARS-CoV-2 by FDA under an Emergency Use Authorization (EUA). This EUA will remain  in effect (meaning this test can be used) for the duration of the COVID-19 declaration under Se ction 564(b)(1) of the Act, 21 U.S.C. section 360bbb-3(b)(1), unless the authorization is terminated or revoked sooner.  Performed at [facility], [place] [address] [place], [place] [address]    No results found.  Review of Systems  Constitutional: Negative for chills and fever.  All other systems reviewed and are negative.  Height 5\' 3"  (1.6 m), weight 76.2 kg. Physical Exam Vitals reviewed.  Constitutional:      Comments: Very pleasant.   HENT:     Head: Normocephalic.     Nose: Nose normal.     Mouth/Throat:     Mouth: Mucous membranes are moist.  Eyes:     Pupils: Pupils are equal, round, and reactive to light.  Cardiovascular:     Rate and Rhythm: Normal rate.     Pulses: Normal pulses.  Pulmonary:     Effort: Pulmonary effort is normal.  Abdominal:     General: Abdomen is flat.  Genitourinary:    Comments: No CVAT at present.  Musculoskeletal:        General: Normal range of motion.     Cervical back: Normal range of motion.  Skin:    General: Skin is warm.  Neurological:     General: No focal deficit present.     Mental Status: She is alert.      Assessment/Plan  Proceed as planned with RIGHT ureteroscopic stone manipulation / stent exchange. Risks, benefits, alternatives, expected peri-op course discussed previously and reiterated today.    Alexis Frock, MD 02/25/2020, 8:05 AM

## 2020-02-25 NOTE — Anesthesia Procedure Notes (Signed)
Procedure Name: LMA Insertion Date/Time: 02/25/2020 1:57 PM Performed by: Genelle Bal, CRNA Pre-anesthesia Checklist: Patient identified, Emergency Drugs available, Suction available and Patient being monitored Patient Re-evaluated:Patient Re-evaluated prior to induction Oxygen Delivery Method: Circle system utilized Preoxygenation: Pre-oxygenation with 100% oxygen Induction Type: IV induction Ventilation: Mask ventilation without difficulty LMA: LMA inserted LMA Size: 4.0 Number of attempts: 1 Airway Equipment and Method: Bite block Placement Confirmation: positive ETCO2 Tube secured with: Tape Dental Injury: Teeth and Oropharynx as per pre-operative assessment

## 2020-02-25 NOTE — Brief Op Note (Signed)
02/25/2020  2:37 PM  PATIENT:  [name] Little  87 y.o. female  PRE-OPERATIVE DIAGNOSIS:  RIGHT RENAL / URETERAL STONES  POST-OPERATIVE DIAGNOSIS:  RIGHT RENAL / URETERAL STONES  PROCEDURE:  Procedure(s) with comments: CYSTOSCOPY WITH RETROGRADE PYELOGRAM, URETEROSCOPY AND STENT EXCHANGE (Right) - 75 MINS HOLMIUM LASER APPLICATION (Right)  SURGEON:  Surgeon(s) and Role:    Alexis Frock, MD - Primary  PHYSICIAN ASSISTANT:   ASSISTANTS: none   ANESTHESIA:   general  EBL:  2 mL   BLOOD ADMINISTERED:none  DRAINS: none   LOCAL MEDICATIONS USED:  NONE  SPECIMEN:  Source of Specimen:  right ureteral / renal stone fragments  DISPOSITION OF SPECIMEN:  [facility] for compositional analysis  COUNTS:  YES  TOURNIQUET:  * No tourniquets in log *  DICTATION: .Other Dictation: Dictation Number [id number]  PLAN OF CARE: Discharge to home after PACU  PATIENT DISPOSITION:  PACU - hemodynamically stable.   Delay start of Pharmacological VTE agent (>24hrs) due to surgical blood loss or risk of bleeding: not applicable

## 2020-02-25 NOTE — Discharge Instructions (Signed)
[facility] [phone] Post Ureteroscopy With or Without Stent Instructions  Definitions:  Ureter: The duct that transports urine from the kidney to the bladder. Stent:   A plastic hollow tube that is placed into the ureter, from the kidney to the bladder to prevent the ureter from swelling shut.  GENERAL INSTRUCTIONS:  Despite the fact that no skin incisions were used, the area around the ureter and bladder is raw and irritated. The stent is a foreign body which will further irritate the bladder wall. This irritation is manifested by increased frequency of urination, both day and night, and by an increase in the urge to urinate. In some, the urge to urinate is present almost always. Sometimes the urge is strong enough that you may not be able to stop yourself from urinating. The only real cure is to remove the stent and then give time for the bladder wall to heal which can't be done until the danger of the ureter swelling shut has passed, which varies.  You may see some blood in your urine while the stent is in place and a few days afterwards. Do not be alarmed, even if the urine was clear for a while. Get off your feet and drink lots of fluids until clearing occurs. If you start to pass clots or don't improve, call us.  DIET: You may return to your normal diet immediately. Because of the raw surface of your bladder, alcohol, spicy foods, acid type foods and drinks with caffeine may cause irritation or frequency and should be used in moderation. To keep your urine flowing freely and to avoid constipation, drink plenty of fluids during the day ( 8-10 glasses ). Tip: Avoid cranberry juice because it is very acidic.  ACTIVITY: Your physical activity doesn't need to be restricted. However, if you are very active, you may see some blood in your urine. We suggest that you reduce your activity under these circumstances until the bleeding has stopped.  BOWELS: It is important to  keep your bowels regular during the postoperative period. Straining with bowel movements can cause bleeding. A bowel movement every other day is reasonable. Use a mild laxative if needed, such as Milk of Magnesia 2-3 tablespoons, or 2 Dulcolax tablets. Call if you continue to have problems. If you have been taking narcotics for pain, before, during or after your surgery, you may be constipated. Take a laxative if necessary.   MEDICATION: You should resume your pre-surgery medications unless told not to. In addition you will often be given an antibiotic to prevent infection. These should be taken as prescribed until the bottles are finished unless you are having an unusual reaction to one of the drugs.  PROBLEMS YOU SHOULD REPORT TO US:  Fevers over 100.5 Fahrenheit.  Heavy bleeding, or clots ( See above notes about blood in urine ).  Inability to urinate.  Drug reactions ( hives, rash, nausea, vomiting, diarrhea ).  Severe burning or pain with urination that is not improving.  FOLLOW-UP: You will need a follow-up appointment to monitor your progress. Call for this appointment at the number listed above. Usually the first appointment will be about three to fourteen days after your surgery.      Post Anesthesia Home Care Instructions  Activity: Get plenty of rest for the remainder of the day. A responsible adult should stay with you for 24 hours following the procedure.  For the next 24 hours, DO NOT: -Drive a car -Operate machinery -Drink alcoholic beverages -Take   any medication unless instructed by your physician -Make any legal decisions or sign important papers.  Meals: Start with liquid foods such as gelatin or soup. Progress to regular foods as tolerated. Avoid greasy, spicy, heavy foods. If nausea and/or vomiting occur, drink only clear liquids until the nausea and/or vomiting subsides. Call your physician if vomiting continues.  Special Instructions/Symptoms: Your throat  may feel dry or sore from the anesthesia or the breathing tube placed in your throat during surgery. If this causes discomfort, gargle with warm salt water. The discomfort should disappear within 24 hours.  If you had a scopolamine patch placed behind your ear for the management of post- operative nausea and/or vomiting:  1. The medication in the patch is effective for 72 hours, after which it should be removed.  Wrap patch in a tissue and discard in the trash. Wash hands thoroughly with soap and water. 2. You may remove the patch earlier than 72 hours if you experience unpleasant side effects which may include dry mouth, dizziness or visual disturbances. 3. Avoid touching the patch. Wash your hands with soap and water after contact with the patch.   1 - You may have urinary urgency (bladder spasms) and bloody urine on / off with stent in place. This is normal.  2 - Remove tethered stent on Friday morning by pulling on string, then blue-white plastic tubing, and discarding. Office is open Friday if any problems arise.   3 - Call MD or go to ER for fever >102, severe pain / nausea / vomiting not relieved by medications, or acute change in medical status

## 2020-02-26 ENCOUNTER — Encounter (HOSPITAL_BASED_OUTPATIENT_CLINIC_OR_DEPARTMENT_OTHER): Payer: Self-pay | Admitting: Urology

## 2020-02-26 ENCOUNTER — Ambulatory Visit (INDEPENDENT_AMBULATORY_CARE_PROVIDER_SITE_OTHER): Payer: Medicare PPO | Admitting: General Practice

## 2020-02-26 DIAGNOSIS — Z7901 Long term (current) use of anticoagulants: Secondary | ICD-10-CM | POA: Diagnosis not present

## 2020-02-26 DIAGNOSIS — N201 Calculus of ureter: Secondary | ICD-10-CM | POA: Diagnosis not present

## 2020-02-26 DIAGNOSIS — Z86718 Personal history of other venous thrombosis and embolism: Secondary | ICD-10-CM

## 2020-02-26 NOTE — Patient Instructions (Signed)
Pre visit review using our clinic review tool, if applicable. No additional management support is needed unless otherwise documented below in the visit note. Continue to take 1 tablet daily except 1/2 tablet on Mon Wed and Fridays.  Re-check in 4 weeks.  Increase vegetables up to 2 to 3 servings weekly. Pt had a urinary procedure yesterday, 1/26 and is having some residual bleeding today.  I used the INR from yesterdays hospital lab.  Patient verbalized understanding and will call urology tomorrow if bleeding hasn't cleared up.

## 2020-02-26 NOTE — Anesthesia Postprocedure Evaluation (Signed)
Anesthesia Post Note  Patient: [name] Little  Procedure(s) Performed: CYSTOSCOPY WITH RETROGRADE PYELOGRAM, URETEROSCOPY AND STENT EXCHANGE (Right Ureter) HOLMIUM LASER APPLICATION (Right Ureter)     Patient location during evaluation: PACU Anesthesia Type: General Level of consciousness: awake and alert Pain management: pain level controlled Vital Signs Assessment: post-procedure vital signs reviewed and stable Respiratory status: spontaneous breathing, nonlabored ventilation, respiratory function stable and patient connected to nasal cannula oxygen Cardiovascular status: blood pressure returned to baseline and stable Postop Assessment: no apparent nausea or vomiting Anesthetic complications: no   No complications documented.  Last Vitals:  Vitals:   02/25/20 1545 02/25/20 1615  BP: (!) 161/66 (!) 178/71  Pulse: (!) 49 (!) 55  Resp: 14 12  Temp: (!) 36.4 C 36.5 C  SpO2: 100% 94%    Last Pain:  Vitals:   02/26/20 1035  TempSrc:   PainSc: 0-No pain   Pain Goal: Patients Stated Pain Goal: 6 (02/25/20 1545)                 [place]

## 2020-02-26 NOTE — Progress Notes (Signed)
Medical screening examination/treatment/procedure(s) were performed by non-physician practitioner and as supervising physician I was immediately available for consultation/collaboration. I agree with above. Leston Schueller, MD   

## 2020-02-26 NOTE — Op Note (Signed)
NAME: [name] Little, [name] Little MEDICAL RECORD [id number] ACCOUNT [id number] DATE OF BIRTH:[birth year] FACILITY: [facility] LOCATION: [facility] PHYSICIAN:Ernst Cumpston, MD  OPERATIVE REPORT  DATE OF PROCEDURE:  02/25/2020  PREOPERATIVE DIAGNOSES:  Right ureteral stone,  history of pyelonephritis.  PROCEDURES: 1.  Cystoscopy, right retrograde pyelogram and interpretation. 2.  Right ureteroscopy with laser lithotripsy. 3.  Exchange of right ureteral stent 5 x 22 Polaris with tether.  ESTIMATED BLOOD LOSS:  Nil.  COMPLICATIONS:  None.  SPECIMEN:  Right ureteral stone fragments for composition analysis.  FINDINGS: 1.  Retrograde displacement of prior ureteral stone into the right kidney. 2.  Complete resolution of all accessible stone fragments larger than one-third mm following laser lithotripsy and basket extraction on the right side. 3.  Successful placement of right ureteral stent, proximal end in renal pelvis, distal end in urinary bladder.  INDICATIONS:  The patient is a pleasant 87 year old lady who was found on workup of flank pain and fevers to have a right ureteral stone with significant bacteriuria concerning for obstructing pyelonephritis.  She underwent stenting last month in the  urgent setting for renal decompression, was placed on culture specific IV antibiotics, bridged to oral agents, and she now presents for definitive stone management.  Informed consent was obtained and placed in medical record.  DESCRIPTION OF PROCEDURE:  The patient being [name] Little verified, the procedure being right ureteroscopic stone manipulation was confirmed.  Procedure timeout was performed.  Intravenous antibiotics were administered.  General anesthesia was induced.   The patient was placed into a low lithotomy position.  Sterile field was created, prepped and draped the patient's vagina, introitus, and proximal thighs using iodine.  Cystourethroscopy was performed using 21-French cystoscope with  offset lens.   Inspection of bladder revealed distal end of right ureteral stent in situ.  There was minimal encrustation.  Stent was grasped, brought out in its entirety, set aside for discard.  It was inspected and intact.  The right ureteral orifice was then  cannulated with a 6-French renal catheter and right retrograde pyelogram was obtained.  Right retrograde pyelogram demonstrated a single right ureter, single system right kidney.  There was a somewhat high insertion of the UPJ, but still physiologic.  A 0.03 ZIPwire was advanced to lower pole and set aside as a safety wire.  An 8-French  feeding tube was placed in the urinary bladder for pressure release, and semirigid ureteroscopy was performed of the entire length of right ureter alongside a separate sensor working wire.  No significant mucosal abnormalities were found or intraluminal  stone.  This likely signifying prior retrograde positioning of her ureteral stone.  The semirigid scope was then exchanged for a 12/14 short length ureteral access sheath to the level of the upper third of the ureter using continuous fluoroscopic  guidance, and flexible digital ureteroscopy was performed of the proximal ureter and systematic inspection of the right kidney including all calices x3 with a dual channel flexible digital ureteroscope.  Inspection of the kidney revealed 1 dominant foci  of stone in the upper pole and then 1 smaller focus of stone in mid pole.  The larger stone was not amenable to simple basketing.  As such, holmium laser energy was applied to stone using setting of 0.2 joules and 20 Hz.  It was fragmented into 2 smaller  pieces that were elongated in geometry.  They were grasped with their long axis sequentially, removed and set aside for composition analysis as was the mid pole fragment.  Endoscopic examination  of the right kidney again revealed complete resolution of  all accessible stone fragments larger than one-third mm with  excellent hemostasis.  No evidence of any perforation.  The access sheath was removed under continuous vision.  No significant mucosal abnormalities were found.  Given her prior infectious  parameters, it was felt that brief interval stenting with a tethered stent would be most prudent.  As such, a new 5 x 22 Polaris-type stent was placed over remaining safety wire using fluoroscopic guidance.  Good proximal and distal planes were noted.   Tether was left in place and tucked per vagina, and the procedure was terminated.  The patient tolerated the procedure well.  No immediate perioperative complications.  The patient was taken to postanesthesia care unit in stable condition.  Plan for discharge home.  IN/NUANCE  D:02/25/2020 T:02/25/2020 JOB:[id number]

## 2020-03-11 ENCOUNTER — Other Ambulatory Visit: Payer: Self-pay | Admitting: Family Medicine

## 2020-03-11 ENCOUNTER — Ambulatory Visit: Payer: Self-pay

## 2020-03-11 ENCOUNTER — Ambulatory Visit: Payer: Self-pay | Admitting: *Deleted

## 2020-03-16 ENCOUNTER — Telehealth: Payer: Self-pay | Admitting: Family Medicine

## 2020-03-16 NOTE — Telephone Encounter (Signed)
Patient calling states shes getting some dental work done and she wanted to ask what she needed to do about her coumadin

## 2020-03-16 NOTE — Telephone Encounter (Signed)
[name] Hare, RN spoke with patient and asked her to contact her dental office and find out what INR they are comfortable with.  Patient verbalized understanding.

## 2020-03-19 ENCOUNTER — Other Ambulatory Visit: Payer: Self-pay | Admitting: Family Medicine

## 2020-03-19 DIAGNOSIS — Z7901 Long term (current) use of anticoagulants: Secondary | ICD-10-CM

## 2020-03-22 ENCOUNTER — Telehealth: Payer: Self-pay | Admitting: Family Medicine

## 2020-03-22 DIAGNOSIS — R300 Dysuria: Secondary | ICD-10-CM

## 2020-03-22 NOTE — Telephone Encounter (Signed)
yes

## 2020-03-22 NOTE — Telephone Encounter (Signed)
Is it okay for patient to have a lab visit? She is refusing a office visit

## 2020-03-22 NOTE — Telephone Encounter (Signed)
Patient states she is having burning and strong odor with dark urine.  She insist on a lab appt so she can get meds called in. I offered and appt however pt declined and said she would just suffer. I'm sending message to advise to see if she can just come in do labs or if she needs an appt

## 2020-03-22 NOTE — Telephone Encounter (Signed)
scheduled

## 2020-03-23 ENCOUNTER — Other Ambulatory Visit: Payer: Self-pay

## 2020-03-23 ENCOUNTER — Ambulatory Visit: Payer: Medicare PPO | Admitting: General Practice

## 2020-03-23 ENCOUNTER — Other Ambulatory Visit (INDEPENDENT_AMBULATORY_CARE_PROVIDER_SITE_OTHER): Payer: Medicare PPO

## 2020-03-23 DIAGNOSIS — Z7901 Long term (current) use of anticoagulants: Secondary | ICD-10-CM | POA: Diagnosis not present

## 2020-03-23 DIAGNOSIS — R300 Dysuria: Secondary | ICD-10-CM

## 2020-03-23 DIAGNOSIS — Z86718 Personal history of other venous thrombosis and embolism: Secondary | ICD-10-CM | POA: Diagnosis not present

## 2020-03-23 LAB — POC URINALSYSI DIPSTICK (AUTOMATED)
Bilirubin, UA: NEGATIVE
Blood, UA: NEGATIVE
Glucose, UA: NEGATIVE
Ketones, UA: NEGATIVE
Leukocytes, UA: NEGATIVE
Nitrite, UA: POSITIVE
Protein, UA: NEGATIVE
Spec Grav, UA: 1.025 (ref 1.010–1.025)
Urobilinogen, UA: 0.2 E.U./dL
pH, UA: 6 (ref 5.0–8.0)

## 2020-03-23 LAB — POCT INR: INR: 3 (ref 2.0–3.0)

## 2020-03-23 NOTE — Progress Notes (Signed)
Medical screening examination/treatment/procedure(s) were performed by non-physician practitioner and as supervising physician I was immediately available for consultation/collaboration. I agree with above. Kyzen Horn, MD   

## 2020-03-23 NOTE — Patient Instructions (Addendum)
Pre visit review using our clinic review tool, if applicable. No additional management support is needed unless otherwise documented below in the visit note.  Continue to take 1 tablet daily except 1/2 tablet on Mon Wed and Fridays.   Re-check in 4 weeks.  

## 2020-03-25 ENCOUNTER — Other Ambulatory Visit: Payer: Self-pay | Admitting: Family Medicine

## 2020-03-25 DIAGNOSIS — N390 Urinary tract infection, site not specified: Secondary | ICD-10-CM

## 2020-03-25 LAB — URINE CULTURE
MICRO NUMBER:: 11563831
SPECIMEN QUALITY:: ADEQUATE

## 2020-03-25 MED ORDER — AMOXICILLIN-POT CLAVULANATE 875-125 MG PO TABS
1.0000 | ORAL_TABLET | Freq: Two times a day (BID) | ORAL | 0 refills | Status: DC
Start: 2020-03-25 — End: 2020-06-23

## 2020-03-26 ENCOUNTER — Other Ambulatory Visit: Payer: Self-pay

## 2020-03-26 ENCOUNTER — Other Ambulatory Visit: Payer: Self-pay | Admitting: Family Medicine

## 2020-03-27 MED ORDER — VALSARTAN 160 MG PO TABS: 160.0000 mg | ORAL_TABLET | Freq: Every day | ORAL | 2 refills | Status: DC

## 2020-03-27 NOTE — Telephone Encounter (Signed)
Medication sent and NV scheduled

## 2020-03-27 NOTE — Addendum Note (Signed)
Addended by: Jeronimo Greaves on: 03/27/2020 12:34 PM   Modules accepted: Orders

## 2020-03-27 NOTE — Telephone Encounter (Signed)
Pharmacy note states : all strengths are on back order and no release date

## 2020-03-27 NOTE — Telephone Encounter (Signed)
Spoke with pharmacy and resent script and they confirmed script while on the phone.

## 2020-03-27 NOTE — Telephone Encounter (Signed)
Let pt know please and change to diovan 160 mg 1 po qd #30  2 refills----- make nurse visit for bp check in 2-3 weeks

## 2020-03-30 ENCOUNTER — Other Ambulatory Visit: Payer: Self-pay | Admitting: *Deleted

## 2020-03-30 MED ORDER — NITROFURANTOIN MACROCRYSTAL 50 MG PO CAPS: 50.0000 mg | ORAL_CAPSULE | Freq: Every day | ORAL | 2 refills | Status: DC

## 2020-04-06 ENCOUNTER — Other Ambulatory Visit: Payer: Self-pay

## 2020-04-06 ENCOUNTER — Ambulatory Visit (INDEPENDENT_AMBULATORY_CARE_PROVIDER_SITE_OTHER): Payer: Medicare PPO | Admitting: Family Medicine

## 2020-04-06 VITALS — BP 130/68 | HR 63

## 2020-04-06 DIAGNOSIS — I10 Essential (primary) hypertension: Secondary | ICD-10-CM

## 2020-04-06 NOTE — Progress Notes (Signed)
Pt here for Blood pressure check per Dr. Carollee Herter.  Pt currently takes: valsartan 160mg , hctz 12.5mg    Pt reports compliance with medication.  BP today @ = 142/70  Left arm @1020am  HR = 77  BP rechecked @ = 130/68 Left arm @10 :30am HR: 63  Home Machine @ office was 150/76 HR: 82  Home Readings  04/02/20 = 110/66 @8 :45am 04/04/20 = 125/64 @7 :00pm 04/05/20 = 119/62 @3 :40pm   Pt advised per Dr. Carollee Herter to continue same regimen and well see her at follow up.

## 2020-04-13 DIAGNOSIS — N3000 Acute cystitis without hematuria: Secondary | ICD-10-CM | POA: Diagnosis not present

## 2020-04-13 DIAGNOSIS — N202 Calculus of kidney with calculus of ureter: Secondary | ICD-10-CM | POA: Diagnosis not present

## 2020-04-17 DIAGNOSIS — N200 Calculus of kidney: Secondary | ICD-10-CM | POA: Diagnosis not present

## 2020-04-20 ENCOUNTER — Other Ambulatory Visit: Payer: Self-pay

## 2020-04-20 ENCOUNTER — Ambulatory Visit: Payer: Medicare PPO | Admitting: General Practice

## 2020-04-20 DIAGNOSIS — Z7901 Long term (current) use of anticoagulants: Secondary | ICD-10-CM

## 2020-04-20 DIAGNOSIS — Z86718 Personal history of other venous thrombosis and embolism: Secondary | ICD-10-CM | POA: Diagnosis not present

## 2020-04-20 LAB — POCT INR: INR: 4 — AB (ref 2.0–3.0)

## 2020-04-20 NOTE — Patient Instructions (Addendum)
Pre visit review using our clinic review tool, if applicable. No additional management support is needed unless otherwise documented below in the visit note.  Hold dosage today and tomorrow and then change dosage and take 1/2 tablet daily except 1 tablet on Sunday Tuesday and Thursday.  Re-check in 2 weeks.

## 2020-04-20 NOTE — Progress Notes (Signed)
Medical screening examination/treatment/procedure(s) were performed by non-physician practitioner and as supervising physician I was immediately available for consultation/collaboration. I agree with above. Margery Szostak, MD   

## 2020-05-04 ENCOUNTER — Other Ambulatory Visit: Payer: Self-pay

## 2020-05-04 ENCOUNTER — Ambulatory Visit: Payer: Medicare PPO | Admitting: General Practice

## 2020-05-04 DIAGNOSIS — Z86718 Personal history of other venous thrombosis and embolism: Secondary | ICD-10-CM

## 2020-05-04 DIAGNOSIS — Z7901 Long term (current) use of anticoagulants: Secondary | ICD-10-CM

## 2020-05-04 LAB — POCT INR: INR: 2.7 (ref 2.0–3.0)

## 2020-05-04 NOTE — Progress Notes (Signed)
Medical screening examination/treatment/procedure(s) were performed by non-physician practitioner and as supervising physician I was immediately available for consultation/collaboration. I agree with above. Loghan Subia, MD   

## 2020-05-04 NOTE — Patient Instructions (Addendum)
Pre visit review using our clinic review tool, if applicable. No additional management support is needed unless otherwise documented below in the visit note.  Continue to take 1/2 tablet daily except 1 tablet on Sunday Tuesday and Thursday.  Re-check in 6 weeks.

## 2020-05-17 DIAGNOSIS — N3000 Acute cystitis without hematuria: Secondary | ICD-10-CM | POA: Diagnosis not present

## 2020-05-17 DIAGNOSIS — R8279 Other abnormal findings on microbiological examination of urine: Secondary | ICD-10-CM | POA: Diagnosis not present

## 2020-05-17 DIAGNOSIS — N202 Calculus of kidney with calculus of ureter: Secondary | ICD-10-CM | POA: Diagnosis not present

## 2020-05-25 ENCOUNTER — Other Ambulatory Visit: Payer: Self-pay | Admitting: General Practice

## 2020-05-25 ENCOUNTER — Other Ambulatory Visit: Payer: Self-pay

## 2020-05-25 ENCOUNTER — Ambulatory Visit: Payer: Medicare PPO | Admitting: General Practice

## 2020-05-25 DIAGNOSIS — I10 Essential (primary) hypertension: Secondary | ICD-10-CM

## 2020-05-25 DIAGNOSIS — Z86718 Personal history of other venous thrombosis and embolism: Secondary | ICD-10-CM

## 2020-05-25 DIAGNOSIS — Z7901 Long term (current) use of anticoagulants: Secondary | ICD-10-CM | POA: Diagnosis not present

## 2020-05-25 LAB — POCT INR: INR: 2.5 (ref 2.0–3.0)

## 2020-05-25 MED ORDER — FUROSEMIDE 20 MG PO TABS: 20.0000 mg | ORAL_TABLET | Freq: Every day | ORAL | 0 refills | Status: DC | PRN

## 2020-05-25 NOTE — Patient Instructions (Addendum)
Pre visit review using our clinic review tool, if applicable. No additional management support is needed unless otherwise documented below in the visit note.  Take 1 tablet today and then take 1/2 tablet through Sunday, 5/1.  On Monday 5/2 continue to take 1/2 tablet daily except 1 tablet on Sunday Tuesday and Thursday.  Re-check in 6 weeks. Pt is taking doxycycline for 5 days and will be taking 2 tablets of diflucan today, (4/26).  *Take 1 tablet of the Lasix for 2 days for the swelling in ankles and abdomen.

## 2020-05-25 NOTE — Progress Notes (Signed)
Medical screening examination/treatment/procedure(s) were performed by non-physician practitioner and as supervising physician I was immediately available for consultation/collaboration. I agree with above. Doshia Dalia, MD   

## 2020-06-03 DIAGNOSIS — N3000 Acute cystitis without hematuria: Secondary | ICD-10-CM | POA: Diagnosis not present

## 2020-06-12 ENCOUNTER — Other Ambulatory Visit: Payer: Self-pay | Admitting: Family Medicine

## 2020-06-15 ENCOUNTER — Ambulatory Visit: Payer: Medicare PPO

## 2020-06-21 ENCOUNTER — Encounter: Payer: Medicare PPO | Admitting: Family Medicine

## 2020-06-23 ENCOUNTER — Ambulatory Visit: Payer: Medicare PPO | Admitting: Family

## 2020-06-23 ENCOUNTER — Other Ambulatory Visit: Payer: Self-pay

## 2020-06-23 ENCOUNTER — Telehealth: Payer: Self-pay

## 2020-06-23 VITALS — BP 150/65 | HR 79 | Temp 98.2°F | Resp 16 | Wt 168.0 lb

## 2020-06-23 DIAGNOSIS — I10 Essential (primary) hypertension: Secondary | ICD-10-CM | POA: Diagnosis not present

## 2020-06-23 DIAGNOSIS — Z7901 Long term (current) use of anticoagulants: Secondary | ICD-10-CM

## 2020-06-23 DIAGNOSIS — S90229D Contusion of unspecified lesser toe(s) with damage to nail, subsequent encounter: Secondary | ICD-10-CM | POA: Diagnosis not present

## 2020-06-23 LAB — POCT INR: INR: 2 (ref 2.0–3.0)

## 2020-06-23 NOTE — Telephone Encounter (Signed)
Nurse Assessment Nurse: Jimmey Ralph, RN, Lissa Date/Time (Eastern Time): 06/23/2020 8:02:05 AM Confirm and document reason for call. If symptomatic, describe symptoms. ---Caller states: I wore new shoes and the shoes too tight and I am on coumadin and the big toes on each foot. Alittle drainage out of right toe x 1. No fever. Does the patient have any new or worsening symptoms? ---Yes Will a triage be completed? ---Yes Related visit to physician within the last 2 weeks? ---No Does the PT have any chronic conditions? (i.e. diabetes, asthma, this includes High risk factors for pregnancy, etc.) ---Yes List chronic conditions. ---Coumadin for D5 factor clotting disorder. HTN Is this a behavioral health or substance abuse call? ---No Guidelines Guideline Title Affirmed Question Affirmed Notes Nurse Date/Time (Eastern Time) Toe Injury [1] Toe injury AND [2] bad limp or can't wear shoes/sandals Hammonds, RN, Lissa 06/23/2020 8:04:35 AM Disp. Time Eilene Ghazi Time) Disposition Final User 06/23/2020 8:05:49 AM See PCP within 24 Hours Yes Hammonds, RN, Lissa PLEASE NOTE: All timestamps contained within this report are represented as [place] Standard Time. CONFIDENTIALTY NOTICE: This fax transmission is intended only for the addressee. It contains information that is legally privileged, confidential or otherwise protected from use or disclosure. If you are not the intended recipient, you are strictly prohibited from reviewing, disclosing, copying using or disseminating any of this information or taking any action in reliance on or regarding this information. If you have received this fax in error, please notify us immediately by telephone so that we can arrange for its return to [place]. Phone: [phone], Toll-Free: [phone], Fax: [phone] Page: 2 of 2 Call Id: [id number] [place] Disagree/Comply Comply Caller Understands Yes PreDisposition Did not know what to do Care Advice Given Per  Guideline SEE PCP WITHIN 24 HOURS: * IF OFFICE WILL BE OPEN: You need to be examined within the next 24 hours. Call your doctor (or NP/PA) when the office opens and make an appointment. PAIN MEDICINES: * ACETAMINOPHEN - REGULAR STRENGTH TYLENOL: Take 650 mg (two 325 mg pills) by mouth every 4 to 6 hours as needed. Each Regular Strength Tylenol pill has 325 mg of acetaminophen. The most you should take each day is 3,250 mg (10 pills a day). * IBUPROFEN (E.G., MOTRIN, ADVIL): Take 400 mg (two 200 mg pills) by mouth every 6 hours. The most you should take each day is 1,200 mg (six 200 mg pills), unless your doctor has told you to take more. CALL BACK IF: * You become worse CARE ADVICE given per Toe Injury (Adult) guideline. Comments User: [name] Manners, RN Date/Time Eilene Ghazi Time): 06/23/2020 8:05:05 AM Pain 0/10 User: [name] Manners, RN Date/Time Eilene Ghazi Time): 06/23/2020 8:10:47 AM Warn transferred caller to appointment line and appointment scheduled today at 56. Referrals REFERRED TO PCP OFFICE  Appt scheduled w/ Melissa.

## 2020-06-23 NOTE — Assessment & Plan Note (Signed)
INR today is 2.0. She is scheduled with the coumadin clinic on 5/31 for repeat INR.  She is advised to avoid wearing that pair of tight slippers and to call if increased pain/redness or swelling occurs. Pt verbalizes understanding.

## 2020-06-23 NOTE — Assessment & Plan Note (Addendum)
BP Readings from Last 3 Encounters:  06/23/20 (!) 170/79  04/06/20 130/68  02/25/20 (!) 178/71   Initial BP was elevated but repeat BP was improved.

## 2020-06-23 NOTE — Progress Notes (Signed)
Subjective:   By signing my name below, I, [name], attest that this documentation has been prepared under the direction and in the presence of Debbrah Alar NP. 06/23/2020      Patient ID: [name] Little, female    DOB: 1934/01/26, 87 y.o.   MRN: [id number]  No chief complaint on file.   HPI Patient is in today for a office visit. She complains of bruising beneath both great toenails. She is on Coumadin. She wore new slippers while at church all day Sunday and woke up the next day with her toe nails darkened. She had not had her coumadin level check recently.   Past Medical History:  Diagnosis Date  . Anticoagulated on Coumadin    long-term for factor V---  managed by coumadin clinic and pcp  . Diet-controlled type 2 diabetes mellitus ([place])    followed by pcp  . 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 GAD (generalized anxiety disorder)   . Heart murmur   . Hemorrhoids   . History of avascular necrosis of capital femoral epiphysis 1999   s/p THA right  . History of basal cell carcinoma (BCC) excision    scalp/ neck  . History of Clostridium difficile infection   . History of DVT of lower extremity 1998;  04/ 2006   LLE  . History of external beam radiation therapy 2010   right breast cancer  . History of ITP    per pt hx long-term use prednisone for ITP,  1998 s/p total splenctomy  . History of kidney stones   . History of melanoma 1980s   s/p  right calf melanoma excision , per pt not malignant and localized ,  no recurrence  . History of paroxysmal supraventricular tachycardia    (02-23-2020  pt stated have not had any issues / symptoms in several years  . History of pulmonary embolus (PE) 04/2004  . History of right breast cancer 2010   08-10-2008 s/p  right lumpectomy w/ sln bx (partial mastectomy) , ER+, Stage I,  completed radiation 2010,  per pt no recurrance  . Hypertension   . Macular degeneration, bilateral    mild  .  Mitral valve regurgitation 09/01/2016   cardiology-- dr Johnsie Cancel--- per note mild to moderate without stenosis,    . Mixed hyperlipidemia 05/18/2015  . Nocturia   . OA (osteoarthritis)    lumbar stenosis, radiculopathy, OA- L hip  . Personal history of colonic polyps    hyperplastic  . Renal calculus, right   . Right ureteral calculus   . Thoracic ascending aortic aneurysm [facility])    last CT angio chest in epic 11-29-2016  , 3.7cm    Past Surgical History:  Procedure Laterality Date  . ABDOMINAL HYSTERECTOMY  1973  . BREAST LUMPECTOMY WITH RADIOACTIVE SEED AND SENTINEL LYMPH NODE BIOPSY Right 08-10-2008  '@[facility]'   . CATARACT EXTRACTION W/ INTRAOCULAR LENS  IMPLANT, BILATERAL Bilateral 2001  . CYSTOSCOPY WITH RETROGRADE PYELOGRAM, URETEROSCOPY AND STENT PLACEMENT Right 02/25/2020   Procedure: CYSTOSCOPY WITH RETROGRADE PYELOGRAM, URETEROSCOPY AND STENT EXCHANGE;  Surgeon: Alexis Frock, MD;  Location: [facility];  Service: Urology;  Laterality: Right;  75 MINS  . CYSTOSCOPY WITH STENT PLACEMENT Right 01/27/2020   Procedure: CYSTOSCOPY WITH STENT PLACEMENT;  Surgeon: Alexis Frock, MD;  Location: [facility];  Service: Urology;  Laterality: Right;  . CYSTOSCOPY/URETEROSCOPY/HOLMIUM LASER/STENT PLACEMENT Left 06-27-2018  dr Amalia Hailey,  '@[facility]'   . HOLMIUM LASER APPLICATION Right  02/25/2020   Procedure: HOLMIUM LASER APPLICATION;  Surgeon: Alexis Frock, MD;  Location: [facility];  Service: Urology;  Laterality: Right;  . I & D EXTREMITY Right 10/26/2017   Procedure: IRRIGATION AND DEBRIDEMENT RIGHT HAND;  Surgeon: Iran Planas, MD;  Location: [place];  Service: Orthopedics;  Laterality: Right;  . LUMBAR LAMINECTOMY/DECOMPRESSION MICRODISCECTOMY  04/11/2011   Procedure: LUMBAR LAMINECTOMY/DECOMPRESSION MICRODISCECTOMY;  Surgeon: [name], MD;  Location: [place] [facility];  Service: Neurosurgery;  Laterality: N/A;  Lumbar Five-Sacral One Microdiscectomy  . MELANOMA EXCISION Right  1980s   right calf  . SPLENECTOMY, TOTAL  1998  . TOTAL HIP ARTHROPLASTY Right 1999  . UPPER GASTROINTESTINAL ENDOSCOPY      Family History  Problem Relation Age of Onset  . Heart disease Mother   . Heart disease Father   . Alzheimer's disease Sister   . Dementia Sister   . Dementia Sister   . Heart disease Other        uncles  . Clotting disorder Maternal Uncle   . Breast cancer Paternal Aunt   . Cancer Paternal Aunt        breast  . Stroke Sister   . Dementia Sister        mean, carotid artery disease  . Colon cancer Neg Hx   . Anesthesia problems Neg Hx   . Hypotension Neg Hx   . Malignant hyperthermia Neg Hx   . Pseudochol deficiency Neg Hx     Social History   Socioeconomic History  . Marital status: Married    Spouse name: Not on file  . Number of children: 4  . Years of education: Not on file  . Highest education level: Not on file  Occupational History  . Occupation: Retired  Tobacco Use  . Smoking status: Never Smoker  . Smokeless tobacco: Never Used  Vaping Use  . Vaping Use: Never used  Substance and Sexual Activity  . Alcohol use: No    Alcohol/week: 0.0 standard drinks  . Drug use: Never  . Sexual activity: Not on file  Other Topics Concern  . Not on file  Social History Narrative   Married 1955   2 sons - '59, '61, 2 daughters '55, '57; 8 grandchildren; 1 great grand   I-ADLs      Social Determinants of Radio broadcast assistant Strain: Not on file  Food Insecurity: Not on file  Transportation Needs: Not on file  Physical Activity: Not on file  Stress: Not on file  Social Connections: Not on file  Intimate Partner Violence: Not on file    Outpatient Medications Prior to Visit  Medication Sig Dispense Refill  . Accu-Chek Softclix Lancets lancets Use to check sugar daily or as needed. 100 each 1  . ALPRAZolam (XANAX) 0.25 MG tablet TAKE ONE TABLET BY MOUTH TWICE DIALY AS NEEDED FOR ANXIETY (Patient taking differently: Take 0.25 mg  by mouth 2 (two) times daily as needed. TAKE ONE TABLET BY MOUTH TWICE DIALY AS NEEDED FOR ANXIETY) 20 tablet 1  . amoxicillin-clavulanate (AUGMENTIN) 875-125 MG tablet Take 1 tablet by mouth 2 (two) times daily. 20 tablet 0  . B Complex Vitamins (VITAMIN B COMPLEX PO) Take 1 tablet by mouth daily.     . Blood Glucose Monitoring Suppl (ACCU-CHEK GUIDE ME) w/Device KIT Use to check sugar daily or as needed 1 kit 0  . Blood Pressure Monitoring (BLOOD PRESSURE KIT) DEVI 1 Device by Does not apply route daily as  needed. Fill per patient insurance preference Dx: Hypertension 1 Device 0  . furosemide (LASIX) 20 MG tablet Take 1 tablet (20 mg total) by mouth daily as needed for fluid or edema. 30 tablet 0  . glucose blood (ACCU-CHEK GUIDE) test strip Use to check sugar daily or as needed 100 each 1  . hydrochlorothiazide (MICROZIDE) 12.5 MG capsule TAKE 1 CAPSULE (12.5 MG TOTAL) BY MOUTH AT BEDTIME. 30 capsule 2  . nitrofurantoin (MACRODANTIN) 50 MG capsule Take 1 capsule (50 mg total) by mouth at bedtime. 30 capsule 2  . potassium chloride SA (KLOR-CON M20) 20 MEQ tablet Take 1 tablet (20 mEq total) by mouth daily as needed. (Patient taking differently: Take 20 mEq by mouth daily as needed (supplement).) 90 tablet 1  . traMADol (ULTRAM) 50 MG tablet Take 1-2 tablets (50-100 mg total) by mouth every 6 (six) hours as needed for moderate pain or severe pain. Post-operatively 15 tablet 0  . valsartan (DIOVAN) 160 MG tablet Take 1 tablet (160 mg total) by mouth daily. 30 tablet 2  . warfarin (COUMADIN) 5 MG tablet TAKE 1 TABLET DAILY EXCEPT TAKE 1/2 TABLET MON WED AND FRIDAYS OR TAKE AS DIRECTED BY ANTICOAGULATION CLINIC 90 tablet 1   No facility-administered medications prior to visit.    Allergies  Allergen Reactions  . Hydralazine Other (See Comments)    Joint and chest pain  . Amlodipine Swelling    "my whole body swelled"  . Carvedilol Swelling    "my whole body swelled"  . Cefuroxime Axetil      REACTION: Ddoes not remember reaction  . Chlorthalidone     Patient reported extreme weakness.  . Statins Other (See Comments)    Weakness, pain Lipitor and Pravachol Weakness, pain Lipitor and Pravachol  . Sulfonamide Derivatives Rash    REACTION: Rash    Review of Systems  Cardiovascular: Positive for leg swelling.  Endo/Heme/Allergies: Bruises/bleeds easily (Bruising under both great toenails).       Objective:    Physical Exam Constitutional:      Appearance: Normal appearance.  HENT:     Head: Normocephalic and atraumatic.     Right Ear: External ear normal.     Left Ear: External ear normal.  Feet:     Comments: Bruising beneath bilateral great toenails Skin:    General: Skin is warm and dry.  Neurological:     Mental Status: She is alert and oriented to person, place, and time.  Psychiatric:        Behavior: Behavior normal.      There were no vitals taken for this visit. Wt Readings from Last 3 Encounters:  02/25/20 162 lb (73.5 kg)  01/27/20 165 lb 12.8 oz (75.2 kg)  12/19/19 171 lb 6.4 oz (77.7 kg)    Diabetic Foot Exam - Simple   No data filed    Lab Results  Component Value Date   WBC 9.4 05/27/2019   HGB 15.3 (H) 02/25/2020   HCT 45.0 02/25/2020   PLT 339.0 05/27/2019   GLUCOSE 117 (H) 02/25/2020   CHOL 220 (H) 12/19/2019   TRIG 289 (H) 12/19/2019   HDL 33 (L) 12/19/2019   LDLDIRECT 115.0 11/26/2018   LDLCALC 143 (H) 12/19/2019   ALT 10 12/19/2019   AST 19 12/19/2019   NA 140 02/25/2020   K 4.5 02/25/2020   CL 103 02/25/2020   CREATININE 1.00 02/25/2020   BUN 35 (H) 02/25/2020   CO2 27 01/27/2020   TSH 3.80  05/27/2019   INR 2.5 05/25/2020   HGBA1C 6.5 (H) 12/19/2019   MICROALBUR 1.5 10/20/2014    Lab Results  Component Value Date   TSH 3.80 05/27/2019   Lab Results  Component Value Date   WBC 9.4 05/27/2019   HGB 15.3 (H) 02/25/2020   HCT 45.0 02/25/2020   MCV 92.9 05/27/2019   PLT 339.0 05/27/2019   Lab Results   Component Value Date   NA 140 02/25/2020   K 4.5 02/25/2020   CHLORIDE 109 08/12/2013   CO2 27 01/27/2020   GLUCOSE 117 (H) 02/25/2020   BUN 35 (H) 02/25/2020   CREATININE 1.00 02/25/2020   BILITOT 0.6 12/19/2019   ALKPHOS 55 05/27/2019   AST 19 12/19/2019   ALT 10 12/19/2019   PROT 7.3 12/19/2019   ALBUMIN 4.2 05/27/2019   CALCIUM 9.3 01/27/2020   ANIONGAP 12 01/27/2020   GFR 58.97 (L) 05/27/2019   Lab Results  Component Value Date   CHOL 220 (H) 12/19/2019   Lab Results  Component Value Date   HDL 33 (L) 12/19/2019   Lab Results  Component Value Date   LDLCALC 143 (H) 12/19/2019   Lab Results  Component Value Date   TRIG 289 (H) 12/19/2019   Lab Results  Component Value Date   CHOLHDL 6.7 (H) 12/19/2019   Lab Results  Component Value Date   HGBA1C 6.5 (H) 12/19/2019       Assessment & Plan:   Problem List Items Addressed This Visit   None      No orders of the defined types were placed in this encounter.   I, Debbrah Alar NP, personally preformed the services described in this documentation.  All medical record entries made by the scribe were at my direction and in my presence.  I have reviewed the chart and discharge instructions (if applicable) and agree that the record reflects my personal performance and is accurate and complete. 06/23/2020   I,[name],acting as a Education administrator for Nance Pear, NP.,have documented all relevant documentation on the behalf of Nance Pear, NP,as directed by  Nance Pear, NP while in the presence of Nance Pear, NP.   Shehryar [employer]

## 2020-06-23 NOTE — Patient Instructions (Signed)
Please keep your upcoming coumadin appointment.  Call if you develop increased pain, swelling or redness.

## 2020-06-26 ENCOUNTER — Other Ambulatory Visit: Payer: Self-pay | Admitting: Family Medicine

## 2020-06-29 ENCOUNTER — Other Ambulatory Visit: Payer: Self-pay

## 2020-06-29 ENCOUNTER — Ambulatory Visit: Payer: Medicare PPO | Admitting: General Practice

## 2020-06-29 DIAGNOSIS — Z86718 Personal history of other venous thrombosis and embolism: Secondary | ICD-10-CM

## 2020-06-29 DIAGNOSIS — Z7901 Long term (current) use of anticoagulants: Secondary | ICD-10-CM

## 2020-06-29 LAB — POCT INR: INR: 2.2 (ref 2.0–3.0)

## 2020-06-29 NOTE — Patient Instructions (Signed)
Pre visit review using our clinic review tool, if applicable. No additional management support is needed unless otherwise documented below in the visit note.  Continue to take 1/2 tablet daily except 1 tablet on Sunday Tuesday and Thursday.  Re-check in 6 weeks.

## 2020-06-29 NOTE — Progress Notes (Signed)
Medical screening examination/treatment/procedure(s) were performed by non-physician practitioner and as supervising physician I was immediately available for consultation/collaboration. I agree with above. Marrell Dicaprio, MD   

## 2020-07-05 ENCOUNTER — Other Ambulatory Visit: Payer: Self-pay

## 2020-07-06 ENCOUNTER — Ambulatory Visit (INDEPENDENT_AMBULATORY_CARE_PROVIDER_SITE_OTHER): Payer: Medicare PPO | Admitting: Family Medicine

## 2020-07-06 ENCOUNTER — Encounter: Payer: Self-pay | Admitting: Family Medicine

## 2020-07-06 ENCOUNTER — Other Ambulatory Visit: Payer: Self-pay

## 2020-07-06 VITALS — BP 138/86 | HR 76 | Temp 98.3°F | Resp 18 | Ht 63.0 in | Wt 170.8 lb

## 2020-07-06 DIAGNOSIS — C50919 Malignant neoplasm of unspecified site of unspecified female breast: Secondary | ICD-10-CM | POA: Diagnosis not present

## 2020-07-06 DIAGNOSIS — N390 Urinary tract infection, site not specified: Secondary | ICD-10-CM

## 2020-07-06 DIAGNOSIS — E785 Hyperlipidemia, unspecified: Secondary | ICD-10-CM | POA: Diagnosis not present

## 2020-07-06 DIAGNOSIS — R5383 Other fatigue: Secondary | ICD-10-CM | POA: Diagnosis not present

## 2020-07-06 DIAGNOSIS — E1169 Type 2 diabetes mellitus with other specified complication: Secondary | ICD-10-CM | POA: Diagnosis not present

## 2020-07-06 DIAGNOSIS — E1165 Type 2 diabetes mellitus with hyperglycemia: Secondary | ICD-10-CM | POA: Diagnosis not present

## 2020-07-06 DIAGNOSIS — Z23 Encounter for immunization: Secondary | ICD-10-CM

## 2020-07-06 DIAGNOSIS — Z0000 Encounter for general adult medical examination without abnormal findings: Secondary | ICD-10-CM | POA: Diagnosis not present

## 2020-07-06 DIAGNOSIS — E669 Obesity, unspecified: Secondary | ICD-10-CM

## 2020-07-06 DIAGNOSIS — I10 Essential (primary) hypertension: Secondary | ICD-10-CM

## 2020-07-06 LAB — COMPREHENSIVE METABOLIC PANEL
ALT: 14 U/L (ref 0–35)
AST: 20 U/L (ref 0–37)
Albumin: 4.2 g/dL (ref 3.5–5.2)
Alkaline Phosphatase: 56 U/L (ref 39–117)
BUN: 28 mg/dL — ABNORMAL HIGH (ref 6–23)
CO2: 30 mEq/L (ref 19–32)
Calcium: 9.8 mg/dL (ref 8.4–10.5)
Chloride: 102 mEq/L (ref 96–112)
Creatinine, Ser: 0.89 mg/dL (ref 0.40–1.20)
GFR: 59.48 mL/min — ABNORMAL LOW (ref >60.00)
Glucose, Bld: 101 mg/dL — ABNORMAL HIGH (ref 70–99)
Potassium: 4.6 mEq/L (ref 3.5–5.1)
Sodium: 141 mEq/L (ref 135–145)
Total Bilirubin: 0.6 mg/dL (ref 0.2–1.2)
Total Protein: 7.3 g/dL (ref 6.0–8.3)

## 2020-07-06 LAB — LIPID PANEL
Cholesterol: 239 mg/dL — ABNORMAL HIGH (ref 0–200)
HDL: 38.1 mg/dL — ABNORMAL LOW (ref >39.00)
Total CHOL/HDL Ratio: 6
Triglycerides: 421 mg/dL — ABNORMAL HIGH (ref 0.0–149.0)

## 2020-07-06 LAB — HEMOGLOBIN A1C: Hgb A1c MFr Bld: 6.8 % — ABNORMAL HIGH (ref 4.6–6.5)

## 2020-07-06 LAB — LDL CHOLESTEROL, DIRECT: Direct LDL: 115 mg/dL

## 2020-07-06 LAB — CBC WITH DIFFERENTIAL/PLATELET
Basophils Absolute: 0.1 10*3/uL (ref 0.0–0.1)
Basophils Relative: 1.3 % (ref 0.0–3.0)
Eosinophils Absolute: 0.2 10*3/uL (ref 0.0–0.7)
Eosinophils Relative: 1.9 % (ref 0.0–5.0)
HCT: 41.1 % (ref 36.0–46.0)
Hemoglobin: 13.4 g/dL (ref 12.0–15.0)
Lymphocytes Relative: 48.8 % — ABNORMAL HIGH (ref 12.0–46.0)
Lymphs Abs: 4.6 10*3/uL — ABNORMAL HIGH (ref 0.7–4.0)
MCHC: 32.6 g/dL (ref 30.0–36.0)
MCV: 91.6 fl (ref 78.0–100.0)
Monocytes Absolute: 1.3 10*3/uL — ABNORMAL HIGH (ref 0.1–1.0)
Monocytes Relative: 13.4 % — ABNORMAL HIGH (ref 3.0–12.0)
Neutro Abs: 3.3 10*3/uL (ref 1.4–7.7)
Neutrophils Relative %: 34.6 % — ABNORMAL LOW (ref 43.0–77.0)
Platelets: 305 10*3/uL (ref 150.0–400.0)
RBC: 4.49 Mil/uL (ref 3.87–5.11)
RDW: 14.4 % (ref 11.5–15.5)
WBC: 9.4 10*3/uL (ref 4.0–10.5)

## 2020-07-06 LAB — TSH: TSH: 5.06 u[IU]/mL — ABNORMAL HIGH (ref 0.35–4.50)

## 2020-07-06 LAB — VITAMIN B12: Vitamin B-12: 517 pg/mL (ref 211–911)

## 2020-07-06 LAB — MICROALBUMIN / CREATININE URINE RATIO
Creatinine,U: 85.1 mg/dL
Microalb Creat Ratio: 1.5 mg/g (ref 0.0–30.0)
Microalb, Ur: 1.2 mg/dL (ref 0.0–1.9)

## 2020-07-06 MED ORDER — BLOOD GLUCOSE MONITOR KIT: PACK | 0 refills | Status: DC

## 2020-07-06 MED ORDER — BLOOD GLUCOSE MONITOR KIT
PACK | 0 refills | Status: DC
Start: 2020-07-06 — End: 2020-07-06

## 2020-07-06 NOTE — Addendum Note (Signed)
Addended by: Sanda Linger on: 07/06/2020 04:52 PM   Modules accepted: Orders

## 2020-07-06 NOTE — Assessment & Plan Note (Signed)
No reoccurrence

## 2020-07-06 NOTE — Progress Notes (Signed)
Subjective:   By signing my name below, I, [name], attest that this documentation has been prepared under the direction and in the presence of Dr. Roma Schanz, DO. 07/06/2020     Patient ID: [name] Little, female    DOB: Sep 02, 1933, 87 y.o.   MRN: [id number]  Chief Complaint  Patient presents with  . Annual Exam    Pt states not fasting     HPI Patient is in today for a comprehensive physical exam. She complains of fatigue after taking antibiotics. She stopped taking her antibiotics. Her legs feel weak for the past 3-4 months, but yesterday found relief after working all day. She denies having symptoms of a UTI at this time. She no longer checks are blood sugar because her measuring device stopped working. She recently started taking 160 mg valsartan daily PO, otherwise her medication list is the same. She also complains of not hearing well on her right ear compared to her left ear. She reports having a mole on her right arm that has changed recently. She sees a dermatologist, Dr Pearline Cables, to manage this issue. She is due for a pneumonia vaccine. Her current ophthalmologist is dr Rutherford Guys. She has 3 Covid-19 vaccines at this time. She completes a mammogram every year. She is not interested in getting a bone density scan at this time.    Past Medical History:  Diagnosis Date  . Anticoagulated on Coumadin    long-term for factor V---  managed by coumadin clinic and pcp  . Diet-controlled type 2 diabetes mellitus ([place])    followed by pcp  . 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 GAD (generalized anxiety disorder)   . Heart murmur   . Hemorrhoids   . History of avascular necrosis of capital femoral epiphysis 1999   s/p THA right  . History of basal cell carcinoma (BCC) excision    scalp/ neck  . History of Clostridium difficile infection   . History of DVT of lower extremity 1998;  04/ 2006   LLE  . History of external beam radiation  therapy 2010   right breast cancer  . History of ITP    per pt hx long-term use prednisone for ITP,  1998 s/p total splenctomy  . History of kidney stones   . History of melanoma 1980s   s/p  right calf melanoma excision , per pt not malignant and localized ,  no recurrence  . History of paroxysmal supraventricular tachycardia    (02-23-2020  pt stated have not had any issues / symptoms in several years  . History of pulmonary embolus (PE) 04/2004  . History of right breast cancer 2010   08-10-2008 s/p  right lumpectomy w/ sln bx (partial mastectomy) , ER+, Stage I,  completed radiation 2010,  per pt no recurrance  . Hypertension   . Macular degeneration, bilateral    mild  . Mitral valve regurgitation 09/01/2016   cardiology-- dr Johnsie Cancel--- per note mild to moderate without stenosis,    . Mixed hyperlipidemia 05/18/2015  . Nocturia   . OA (osteoarthritis)    lumbar stenosis, radiculopathy, OA- L hip  . Personal history of colonic polyps    hyperplastic  . Renal calculus, right   . Right ureteral calculus   . Thoracic ascending aortic aneurysm [facility])    last CT angio chest in epic 11-29-2016  , 3.7cm    Past Surgical History:  Procedure Laterality Date  . ABDOMINAL  HYSTERECTOMY  1973  . BREAST LUMPECTOMY WITH RADIOACTIVE SEED AND SENTINEL LYMPH NODE BIOPSY Right 08-10-2008  _0   . CATARACT EXTRACTION W/ INTRAOCULAR LENS  IMPLANT, BILATERAL Bilateral 2001  . CYSTOSCOPY WITH RETROGRADE PYELOGRAM, URETEROSCOPY AND STENT PLACEMENT Right 02/25/2020   Procedure: CYSTOSCOPY WITH RETROGRADE PYELOGRAM, URETEROSCOPY AND STENT EXCHANGE;  Surgeon: Alexis Frock, MD;  Location: [facility];  Service: Urology;  Laterality: Right;  75 MINS  . CYSTOSCOPY WITH STENT PLACEMENT Right 01/27/2020   Procedure: CYSTOSCOPY WITH STENT PLACEMENT;  Surgeon: Alexis Frock, MD;  Location: [facility];  Service: Urology;  Laterality: Right;  . CYSTOSCOPY/URETEROSCOPY/HOLMIUM LASER/STENT PLACEMENT  Left 06-27-2018  dr Amalia Hailey,  _1   . HOLMIUM LASER APPLICATION Right 6/94/8546   Procedure: HOLMIUM LASER APPLICATION;  Surgeon: Alexis Frock, MD;  Location: [facility];  Service: Urology;  Laterality: Right;  . I & D EXTREMITY Right 10/26/2017   Procedure: IRRIGATION AND DEBRIDEMENT RIGHT HAND;  Surgeon: Iran Planas, MD;  Location: [place];  Service: Orthopedics;  Laterality: Right;  . LUMBAR LAMINECTOMY/DECOMPRESSION MICRODISCECTOMY  04/11/2011   Procedure: LUMBAR LAMINECTOMY/DECOMPRESSION MICRODISCECTOMY;  Surgeon: [name], MD;  Location: [place] [facility];  Service: Neurosurgery;  Laterality: N/A;  Lumbar Five-Sacral One Microdiscectomy  . MELANOMA EXCISION Right 1980s   right calf  . SPLENECTOMY, TOTAL  1998  . TOTAL HIP ARTHROPLASTY Right 1999  . UPPER GASTROINTESTINAL ENDOSCOPY      Family History  Problem Relation Age of Onset  . Heart disease Mother   . Heart disease Father   . Alzheimer's disease Sister   . Dementia Sister   . Dementia Sister   . Heart disease Other        uncles  . Clotting disorder Maternal Uncle   . Breast cancer Paternal Aunt   . Cancer Paternal Aunt        breast  . Stroke Sister   . Dementia Sister        mean, carotid artery disease  . Colon cancer Neg Hx   . Anesthesia problems Neg Hx   . Hypotension Neg Hx   . Malignant hyperthermia Neg Hx   . Pseudochol deficiency Neg Hx     Social History   Socioeconomic History  . Marital status: Married    Spouse name: Not on file  . Number of children: 4  . Years of education: Not on file  . Highest education level: Not on file  Occupational History  . Occupation: Retired  Tobacco Use  . Smoking status: Never Smoker  . Smokeless tobacco: Never Used  Vaping Use  . Vaping Use: Never used  Substance and Sexual Activity  . Alcohol use: No    Alcohol/week: 0.0 standard drinks  . Drug use: Never  . Sexual activity: Not on file  Other Topics Concern  . Not on file  Social  History Narrative   Married 1955   2 sons - '59, '61, 2 daughters '55, '57; 8 grandchildren; 1 great grand   I-ADLs      Social Determinants of Radio broadcast assistant Strain: Not on file  Food Insecurity: Not on file  Transportation Needs: Not on file  Physical Activity: Not on file  Stress: Not on file  Social Connections: Not on file  Intimate Partner Violence: Not on file    Outpatient Medications Prior to Visit  Medication Sig Dispense Refill  . Accu-Chek Softclix Lancets lancets Use to check sugar daily or as needed. 100 each 1  .  B Complex Vitamins (VITAMIN B COMPLEX PO) Take 1 tablet by mouth daily.     . Blood Pressure Monitoring (BLOOD PRESSURE KIT) DEVI 1 Device by Does not apply route daily as needed. Fill per patient insurance preference Dx: Hypertension 1 Device 0  . furosemide (LASIX) 20 MG tablet Take 1 tablet (20 mg total) by mouth daily as needed for fluid or edema. 30 tablet 0  . glucose blood (ACCU-CHEK GUIDE) test strip Use to check sugar daily or as needed 100 each 1  . hydrochlorothiazide (MICROZIDE) 12.5 MG capsule TAKE 1 CAPSULE (12.5 MG TOTAL) BY MOUTH AT BEDTIME. 30 capsule 2  . nitrofurantoin (MACRODANTIN) 50 MG capsule Take 1 capsule (50 mg total) by mouth at bedtime. 30 capsule 2  . potassium chloride SA (KLOR-CON M20) 20 MEQ tablet Take 1 tablet (20 mEq total) by mouth daily as needed. (Patient taking differently: Take 20 mEq by mouth daily as needed (supplement).) 90 tablet 1  . traMADol (ULTRAM) 50 MG tablet Take 1-2 tablets (50-100 mg total) by mouth every 6 (six) hours as needed for moderate pain or severe pain. Post-operatively 15 tablet 0  . valsartan (DIOVAN) 160 MG tablet TAKE 1 TABLET BY MOUTH EVERY DAY 30 tablet 0  . warfarin (COUMADIN) 5 MG tablet TAKE 1 TABLET DAILY EXCEPT TAKE 1/2 TABLET MON WED AND FRIDAYS OR TAKE AS DIRECTED BY ANTICOAGULATION CLINIC 90 tablet 1   No facility-administered medications prior to visit.    Allergies   Allergen Reactions  . Hydralazine Other (See Comments)    Joint and chest pain  . Amlodipine Swelling    "my whole body swelled"  . Carvedilol Swelling    "my whole body swelled"  . Cefuroxime Axetil     REACTION: Ddoes not remember reaction  . Chlorthalidone     Patient reported extreme weakness.  . Statins Other (See Comments)    Weakness, pain Lipitor and Pravachol Weakness, pain Lipitor and Pravachol  . Sulfonamide Derivatives Rash    REACTION: Rash    Review of Systems  Constitutional: Positive for malaise/fatigue. Negative for chills and fever.  HENT: Negative for congestion and hearing loss.   Eyes: Negative for discharge.  Respiratory: Negative for cough, sputum production and shortness of breath.   Cardiovascular: Negative for chest pain, palpitations and leg swelling.  Gastrointestinal: Negative for abdominal pain, blood in stool, constipation, diarrhea, heartburn, nausea and vomiting.  Genitourinary: Negative for dysuria, frequency, hematuria and urgency.  Musculoskeletal: Negative for back pain, falls and myalgias.       (+)Legs feel weak  Skin: Negative for rash.  Neurological: Negative for dizziness, sensory change, loss of consciousness, weakness and headaches.  Endo/Heme/Allergies: Negative for environmental allergies. Does not bruise/bleed easily.  Psychiatric/Behavioral: Negative for depression and suicidal ideas. The patient is not nervous/anxious and does not have insomnia.        Objective:    Physical Exam Constitutional:      General: She is not in acute distress.    Appearance: Normal appearance. She is not ill-appearing.  HENT:     Head: Normocephalic and atraumatic.     Right Ear: Tympanic membrane and external ear normal.     Left Ear: Tympanic membrane and external ear normal.  Eyes:     Extraocular Movements: Extraocular movements intact.     Pupils: Pupils are equal, round, and reactive to light.  Cardiovascular:     Rate and Rhythm:  Normal rate and regular rhythm.     Pulses:  Normal pulses.     Heart sounds: Normal heart sounds. No murmur heard. No gallop.   Pulmonary:     Effort: Pulmonary effort is normal. No respiratory distress.     Breath sounds: Normal breath sounds. No wheezing, rhonchi or rales.  Abdominal:     General: Bowel sounds are normal. There is no distension.     Palpations: Abdomen is soft. There is no mass.     Tenderness: There is no abdominal tenderness. There is no guarding or rebound.     Hernia: No hernia is present.  Feet:     Comments: Diabetic Foot Exam - Simple   No data filed    Skin:    General: Skin is warm and dry.     Comments: suspicious mole on right arm. Managed by current dermatologist.  Neurological:     Mental Status: She is alert and oriented to person, place, and time.  Psychiatric:        Behavior: Behavior normal.    Diabetic Foot Exam - Simple   Simple Foot Form Diabetic Foot exam was performed with the following findings: Yes 07/06/2020 12:55 PM  Visual Inspection No deformities, no ulcerations, no other skin breakdown bilaterally: Yes Sensation Testing Intact to touch and monofilament testing bilaterally: Yes Pulse Check Posterior Tibialis and Dorsalis pulse intact bilaterally: Yes Comments      BP 138/86 (BP Location: Right Arm, Patient Position: Sitting, Cuff Size: Large)   Pulse 76   Temp 98.3 F (36.8 C) (Oral)   Resp 18   Ht _0  (1.6 m)   Wt 170 lb 12.8 oz (77.5 kg)   SpO2 96%   BMI 30.26 kg/m  Wt Readings from Last 3 Encounters:  07/06/20 170 lb 12.8 oz (77.5 kg)  06/23/20 168 lb (76.2 kg)  02/25/20 162 lb (73.5 kg)    Diabetic Foot Exam - Simple   Simple Foot Form Diabetic Foot exam was performed with the following findings: Yes 07/06/2020 12:55 PM  Visual Inspection No deformities, no ulcerations, no other skin breakdown bilaterally: Yes Sensation Testing Intact to touch and monofilament testing bilaterally: Yes Pulse  Check Posterior Tibialis and Dorsalis pulse intact bilaterally: Yes Comments    Lab Results  Component Value Date   WBC 9.4 05/27/2019   HGB 15.3 (H) 02/25/2020   HCT 45.0 02/25/2020   PLT 339.0 05/27/2019   GLUCOSE 117 (H) 02/25/2020   CHOL 220 (H) 12/19/2019   TRIG 289 (H) 12/19/2019   HDL 33 (L) 12/19/2019   LDLDIRECT 115.0 11/26/2018   LDLCALC 143 (H) 12/19/2019   ALT 10 12/19/2019   AST 19 12/19/2019   NA 140 02/25/2020   K 4.5 02/25/2020   CL 103 02/25/2020   CREATININE 1.00 02/25/2020   BUN 35 (H) 02/25/2020   CO2 27 01/27/2020   TSH 3.80 05/27/2019   INR 2.2 06/29/2020   HGBA1C 6.5 (H) 12/19/2019   MICROALBUR 1.5 10/20/2014    Lab Results  Component Value Date   TSH 3.80 05/27/2019   Lab Results  Component Value Date   WBC 9.4 05/27/2019   HGB 15.3 (H) 02/25/2020   HCT 45.0 02/25/2020   MCV 92.9 05/27/2019   PLT 339.0 05/27/2019   Lab Results  Component Value Date   NA 140 02/25/2020   K 4.5 02/25/2020   CHLORIDE 109 08/12/2013   CO2 27 01/27/2020   GLUCOSE 117 (H) 02/25/2020   BUN 35 (H) 02/25/2020   CREATININE 1.00 02/25/2020   BILITOT  0.6 12/19/2019   ALKPHOS 55 05/27/2019   AST 19 12/19/2019   ALT 10 12/19/2019   PROT 7.3 12/19/2019   ALBUMIN 4.2 05/27/2019   CALCIUM 9.3 01/27/2020   ANIONGAP 12 01/27/2020   GFR 58.97 (L) 05/27/2019   Lab Results  Component Value Date   CHOL 220 (H) 12/19/2019   Lab Results  Component Value Date   HDL 33 (L) 12/19/2019   Lab Results  Component Value Date   LDLCALC 143 (H) 12/19/2019   Lab Results  Component Value Date   TRIG 289 (H) 12/19/2019   Lab Results  Component Value Date   CHOLHDL 6.7 (H) 12/19/2019   Lab Results  Component Value Date   HGBA1C 6.5 (H) 12/19/2019   Mammogram- Last completed 01/27/2020. Results normal. Repeat in 1 year. Dexa- Last completed 04/24/2016. Results showed osteopenia. Repeat in 3 years.  Colonoscopy- Last completed 12/25/2007. Results showed  diverticulosis.     Assessment & Plan:   Problem List Items Addressed This Visit      Unprioritized   Diabetes mellitus type 2 in obese [facility])    Check labs hgba1c to be done.   minimize simple carbs. Increase exercise as tolerated. Continue current meds       Essential hypertension    Well controlled, no changes to meds. Encouraged heart healthy diet such as the DASH diet and exercise as tolerated.       Hyperlipidemia    Tolerating statin, encouraged heart healthy diet, avoid trans fats, minimize simple carbs and saturated fats. Increase exercise as tolerated      Malignant neoplasm of female breast (HCC)    No reoccurrence       Other fatigue   Relevant Orders   CBC with Differential/Platelet   TSH   Vitamin B12   Comprehensive metabolic panel   POCT Urinalysis Dipstick (Automated)   Preventative health care    ghm utd Check labs        Other Visit Diagnoses    Type 2 diabetes mellitus with hyperglycemia, without long-term current use of insulin (HCC)    -  Primary   Relevant Medications   blood glucose meter kit and supplies KIT   Other Relevant Orders   Lipid panel   Hemoglobin A1c   CBC with Differential/Platelet   Microalbumin / creatinine urine ratio   Recurrent UTI       Primary hypertension       Relevant Orders   Lipid panel   CBC with Differential/Platelet   Comprehensive metabolic panel   Need for pneumococcal vaccination       Relevant Orders   Pneumococcal polysaccharide vaccine 23-valent greater than or equal to 2yo subcutaneous/IM (Completed)       Meds ordered this encounter  Medications  . DISCONTD: blood glucose meter kit and supplies KIT    Sig: Dispense based on patient and insurance preference. Use up to four times daily as directed.    Dispense:  1 each    Refill:  0    Order Specific Question:   Number of strips    Answer:   100    Order Specific Question:   Number of lancets    Answer:   100  . DISCONTD: blood glucose  meter kit and supplies KIT    Sig: Dispense based on patient and insurance preference. Use up to four times daily as directed.    Dispense:  1 each    Refill:  0  Order Specific Question:   Number of strips    Answer:   100    Order Specific Question:   Number of lancets    Answer:   100  . blood glucose meter kit and supplies KIT    Sig: Dispense based on patient and insurance preference. Use up to four times daily as directed. Dx: E11.9    Dispense:  1 each    Refill:  0    Order Specific Question:   Number of strips    Answer:   100    Order Specific Question:   Number of lancets    Answer:   100    I, Dr. Roma Schanz, DO, personally preformed the services described in this documentation.  All medical record entries made by the scribe were at my direction and in my presence.  I have reviewed the chart and discharge instructions (if applicable) and agree that the record reflects my personal performance and is accurate and complete. 07/06/2020   I,[name],acting as a scribe for Ann Held, DO.,have documented all relevant documentation on the behalf of Ann Held, DO,as directed by  Ann Held, DO while in the presence of Ann Held, DO.    Ann Held, DO

## 2020-07-06 NOTE — Assessment & Plan Note (Signed)
Tolerating statin, encouraged heart healthy diet, avoid trans fats, minimize simple carbs and saturated fats. Increase exercise as tolerated 

## 2020-07-06 NOTE — Assessment & Plan Note (Signed)
Check labs hgba1c to be done.   minimize simple carbs. Increase exercise as tolerated. Continue current meds

## 2020-07-06 NOTE — Patient Instructions (Signed)
Preventive Care 85 Years and Older, Female Preventive care refers to lifestyle choices and visits with your health care provider that can promote health and wellness. This includes:  A yearly physical exam. This is also called an annual wellness visit.  Regular dental and eye exams.  Immunizations.  Screening for certain conditions.  Healthy lifestyle choices, such as: ? Eating a healthy diet. ? Getting regular exercise. ? Not using drugs or products that contain nicotine and tobacco. ? Limiting alcohol use. What can I expect for my preventive care visit? Physical exam Your health care provider will check your:  Height and weight. These may be used to calculate your BMI (body mass index). BMI is a measurement that tells if you are at a healthy weight.  Heart rate and blood pressure.  Body temperature.  Skin for abnormal spots. Counseling Your health care provider may ask you questions about your:  Past medical problems.  Family's medical history.  Alcohol, tobacco, and drug use.  Emotional well-being.  Home life and relationship well-being.  Sexual activity.  Diet, exercise, and sleep habits.  History of falls.  Memory and ability to understand (cognition).  Work and work Statistician.  Pregnancy and menstrual history.  Access to firearms. What immunizations do I need? Vaccines are usually given at various ages, according to a schedule. Your health care provider will recommend vaccines for you based on your age, medical history, and lifestyle or other factors, such as travel or where you work.   What tests do I need? Blood tests  Lipid and cholesterol levels. These may be checked every 5 years, or more often depending on your overall health.  Hepatitis C test.  Hepatitis B test. Screening  Lung cancer screening. You may have this screening every year starting at age 85 if you have a 30-pack-year history of smoking and currently smoke or have quit within  the past 15 years.  Colorectal cancer screening. ? All adults should have this screening starting at age 85 and continuing until age 85. ? Your health care provider may recommend screening at age 85 if you are at increased risk. ? You will have tests every 1-10 years, depending on your results and the type of screening test.  Diabetes screening. ? This is done by checking your blood sugar (glucose) after you have not eaten for a while (fasting). ? You may have this done every 1-3 years.  Mammogram. ? This may be done every 1-2 years. ? Talk with your health care provider about how often you should have regular mammograms.  Abdominal aortic aneurysm (AAA) screening. You may need this if you are a current or former smoker.  BRCA-related cancer screening. This may be done if you have a family history of breast, ovarian, tubal, or peritoneal cancers. Other tests  STD (sexually transmitted disease) testing, if you are at risk.  Bone density scan. This is done to screen for osteoporosis. You may have this done starting at age 85. Talk with your health care provider about your test results, treatment options, and if necessary, the need for more tests. Follow these instructions at home: Eating and drinking  Eat a diet that includes fresh fruits and vegetables, whole grains, lean protein, and low-fat dairy products. Limit your intake of foods with high amounts of sugar, saturated fats, and salt.  Take vitamin and mineral supplements as recommended by your health care provider.  Do not drink alcohol if your health care provider tells you not to drink.  If you drink alcohol: ? Limit how much you have to 0-1 drink a day. ? Be aware of how much alcohol is in your drink. In the [place] one drink equals one 12 oz bottle of beer (355 mL), one 5 oz glass of wine (148 mL), or one 1 oz glass of hard liquor (44 mL).   Lifestyle  Take daily care of your teeth and gums. Brush your teeth every morning  and night with fluoride toothpaste. Floss one time each day.  Stay active. Exercise for at least 30 minutes 5 or more days each week.  Do not use any products that contain nicotine or tobacco, such as cigarettes, e-cigarettes, and chewing tobacco. If you need help quitting, ask your health care provider.  Do not use drugs.  If you are sexually active, practice safe sex. Use a condom or other form of protection in order to prevent STIs (sexually transmitted infections).  Talk with your health care provider about taking a low-dose aspirin or statin.  Find healthy ways to cope with stress, such as: ? Meditation, yoga, or listening to music. ? Journaling. ? Talking to a trusted person. ? Spending time with friends and family. Safety  Always wear your seat belt while driving or riding in a vehicle.  Do not drive: ? If you have been drinking alcohol. Do not ride with someone who has been drinking. ? When you are tired or distracted. ? While texting.  Wear a helmet and other protective equipment during sports activities.  If you have firearms in your house, make sure you follow all gun safety procedures. What's next?  Visit your health care provider once a year for an annual wellness visit.  Ask your health care provider how often you should have your eyes and teeth checked.  Stay up to date on all vaccines. This information is not intended to replace advice given to you by your health care provider. Make sure you discuss any questions you have with your health care provider. Document Revised: 01/07/2020 Document Reviewed: 01/10/2018 Elsevier Patient Education  2021 [employer]vier Inc.  

## 2020-07-06 NOTE — Assessment & Plan Note (Signed)
ghm utd Check labs  

## 2020-07-06 NOTE — Assessment & Plan Note (Signed)
Well controlled, no changes to meds. Encouraged heart healthy diet such as the DASH diet and exercise as tolerated.  °

## 2020-07-08 DIAGNOSIS — D225 Melanocytic nevi of trunk: Secondary | ICD-10-CM | POA: Diagnosis not present

## 2020-07-08 DIAGNOSIS — L820 Inflamed seborrheic keratosis: Secondary | ICD-10-CM | POA: Diagnosis not present

## 2020-07-08 DIAGNOSIS — D485 Neoplasm of uncertain behavior of skin: Secondary | ICD-10-CM | POA: Diagnosis not present

## 2020-07-08 DIAGNOSIS — L905 Scar conditions and fibrosis of skin: Secondary | ICD-10-CM | POA: Diagnosis not present

## 2020-07-08 DIAGNOSIS — L570 Actinic keratosis: Secondary | ICD-10-CM | POA: Diagnosis not present

## 2020-07-08 DIAGNOSIS — L821 Other seborrheic keratosis: Secondary | ICD-10-CM | POA: Diagnosis not present

## 2020-07-08 DIAGNOSIS — L578 Other skin changes due to chronic exposure to nonionizing radiation: Secondary | ICD-10-CM | POA: Diagnosis not present

## 2020-07-08 DIAGNOSIS — D1801 Hemangioma of skin and subcutaneous tissue: Secondary | ICD-10-CM | POA: Diagnosis not present

## 2020-07-08 DIAGNOSIS — L814 Other melanin hyperpigmentation: Secondary | ICD-10-CM | POA: Diagnosis not present

## 2020-07-14 ENCOUNTER — Other Ambulatory Visit: Payer: Self-pay | Admitting: Family Medicine

## 2020-07-14 DIAGNOSIS — E785 Hyperlipidemia, unspecified: Secondary | ICD-10-CM

## 2020-07-14 DIAGNOSIS — E1165 Type 2 diabetes mellitus with hyperglycemia: Secondary | ICD-10-CM

## 2020-07-14 MED ORDER — EZETIMIBE 10 MG PO TABS: 10.0000 mg | ORAL_TABLET | Freq: Every day | ORAL | 2 refills | Status: DC

## 2020-07-25 ENCOUNTER — Other Ambulatory Visit: Payer: Self-pay | Admitting: Family Medicine

## 2020-07-25 DIAGNOSIS — I10 Essential (primary) hypertension: Secondary | ICD-10-CM

## 2020-07-26 ENCOUNTER — Other Ambulatory Visit: Payer: Self-pay | Admitting: Family Medicine

## 2020-07-31 ENCOUNTER — Other Ambulatory Visit: Payer: Self-pay | Admitting: Family Medicine

## 2020-08-05 DIAGNOSIS — H353131 Nonexudative age-related macular degeneration, bilateral, early dry stage: Secondary | ICD-10-CM | POA: Diagnosis not present

## 2020-08-05 DIAGNOSIS — Z961 Presence of intraocular lens: Secondary | ICD-10-CM | POA: Diagnosis not present

## 2020-08-05 DIAGNOSIS — H524 Presbyopia: Secondary | ICD-10-CM | POA: Diagnosis not present

## 2020-08-06 DIAGNOSIS — R8271 Bacteriuria: Secondary | ICD-10-CM | POA: Diagnosis not present

## 2020-08-06 DIAGNOSIS — N3000 Acute cystitis without hematuria: Secondary | ICD-10-CM | POA: Diagnosis not present

## 2020-08-10 ENCOUNTER — Other Ambulatory Visit: Payer: Self-pay

## 2020-08-10 ENCOUNTER — Ambulatory Visit: Payer: Medicare PPO | Admitting: General Practice

## 2020-08-10 DIAGNOSIS — Z86718 Personal history of other venous thrombosis and embolism: Secondary | ICD-10-CM

## 2020-08-10 DIAGNOSIS — Z7901 Long term (current) use of anticoagulants: Secondary | ICD-10-CM

## 2020-08-10 LAB — POCT INR: INR: 1.8 — AB (ref 2.0–3.0)

## 2020-08-10 NOTE — Patient Instructions (Signed)
Pre visit review using our clinic review tool, if applicable. No additional management support is needed unless otherwise documented below in the visit note.  Take 1 1/2 tablets today and then continue to take 1/2 tablet daily except 1 tablet on Sunday Tuesday and Thursday.  Re-check in 4 weeks

## 2020-08-21 ENCOUNTER — Other Ambulatory Visit: Payer: Self-pay | Admitting: Family Medicine

## 2020-09-02 DIAGNOSIS — L578 Other skin changes due to chronic exposure to nonionizing radiation: Secondary | ICD-10-CM | POA: Diagnosis not present

## 2020-09-02 DIAGNOSIS — L820 Inflamed seborrheic keratosis: Secondary | ICD-10-CM | POA: Diagnosis not present

## 2020-09-02 DIAGNOSIS — L570 Actinic keratosis: Secondary | ICD-10-CM | POA: Diagnosis not present

## 2020-09-02 DIAGNOSIS — L821 Other seborrheic keratosis: Secondary | ICD-10-CM | POA: Diagnosis not present

## 2020-09-07 ENCOUNTER — Ambulatory Visit: Payer: Medicare PPO | Admitting: General Practice

## 2020-09-07 ENCOUNTER — Other Ambulatory Visit: Payer: Self-pay

## 2020-09-07 DIAGNOSIS — Z7901 Long term (current) use of anticoagulants: Secondary | ICD-10-CM | POA: Diagnosis not present

## 2020-09-07 DIAGNOSIS — Z86718 Personal history of other venous thrombosis and embolism: Secondary | ICD-10-CM | POA: Diagnosis not present

## 2020-09-07 LAB — POCT INR: INR: 1.8 — AB (ref 2.0–3.0)

## 2020-09-07 NOTE — Patient Instructions (Addendum)
Pre visit review using our clinic review tool, if applicable. No additional management support is needed unless otherwise documented below in the visit note.  Take 1 1/2 tablets today and then change dosage and take 1 tablet daily except take 1/2 tablet on Monday Wed and Fridays.  Re-check in 4 weeks.  Re-check in 4 weeks

## 2020-09-07 NOTE — Progress Notes (Signed)
Medical screening examination/treatment/procedure(s) were performed by non-physician practitioner and as supervising physician I was immediately available for consultation/collaboration. I agree with above. Shanti Eichel, MD   

## 2020-09-10 ENCOUNTER — Other Ambulatory Visit: Payer: Self-pay | Admitting: Family Medicine

## 2020-09-10 DIAGNOSIS — Z1231 Encounter for screening mammogram for malignant neoplasm of breast: Secondary | ICD-10-CM

## 2020-09-20 DIAGNOSIS — N3020 Other chronic cystitis without hematuria: Secondary | ICD-10-CM | POA: Diagnosis not present

## 2020-09-20 DIAGNOSIS — N202 Calculus of kidney with calculus of ureter: Secondary | ICD-10-CM | POA: Diagnosis not present

## 2020-09-23 ENCOUNTER — Other Ambulatory Visit: Payer: Self-pay | Admitting: Family Medicine

## 2020-09-30 ENCOUNTER — Other Ambulatory Visit: Payer: Self-pay

## 2020-09-30 ENCOUNTER — Ambulatory Visit: Payer: Medicare PPO | Admitting: Family Medicine

## 2020-09-30 ENCOUNTER — Encounter: Payer: Self-pay | Admitting: Family Medicine

## 2020-09-30 ENCOUNTER — Other Ambulatory Visit (HOSPITAL_COMMUNITY)
Admission: RE | Admit: 2020-09-30 | Discharge: 2020-09-30 | Disposition: A | Discharge status: Home / Self Care | Payer: Medicare PPO | Source: Ambulatory Visit | Attending: Family Medicine | Admitting: Family Medicine

## 2020-09-30 VITALS — BP 130/90 | HR 80 | Temp 98.0°F | Resp 18 | Ht 63.0 in | Wt 173.8 lb

## 2020-09-30 DIAGNOSIS — N949 Unspecified condition associated with female genital organs and menstrual cycle: Secondary | ICD-10-CM | POA: Diagnosis not present

## 2020-09-30 MED ORDER — FLUCONAZOLE 150 MG PO TABS: ORAL_TABLET | ORAL | 0 refills | Status: DC

## 2020-09-30 MED ORDER — ESTROGENS, CONJUGATED 0.625 MG/GM VA CREA: 1.0000 | TOPICAL_CREAM | Freq: Every day | VAGINAL | 12 refills | Status: DC

## 2020-09-30 NOTE — Progress Notes (Signed)
Subjective:   By signing my name below, I, [name] Little, attest that this documentation has been prepared under the direction and in the presence of Dr. Roma Schanz, DO. 09/30/2020    Patient ID: [name] Little, female    DOB: 1933/05/02, 87 y.o.   MRN: [id number]  Chief Complaint  Patient presents with  . Vaginal Pain    4-5 days, Pt states having vaginal burning. No discharge.     Vaginal Pain Pertinent negatives include no abdominal pain, dysuria, fever, frequency, headaches, nausea or rash.   Patient is in today for a office visit. She complains of constant burning sensation in outer vaginal area. She has seen a urologist to manage her symptoms and found she has vaginal dryness. She was applies premarin 2x weekly by her urologist to manage her symptoms and finds mild relief. She denies having discharge at this time but notes she has constantly leaking bladder and cannot tell the difference. She wears a pad all day. She is willing to see a specialist for these symptoms after her burning is treated.  She reports having her recent surgical procedure she was give antibiotics. She was told her antibiotics could stay in her systems for a while and give her yeast infections and she thinks it may be related to her symptoms.    Past Medical History:  Diagnosis Date  . Anticoagulated on Coumadin    long-term for factor V---  managed by coumadin clinic and pcp  . Diet-controlled type 2 diabetes mellitus ([place])    followed by pcp  . 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 GAD (generalized anxiety disorder)   . Heart murmur   . Hemorrhoids   . History of avascular necrosis of capital femoral epiphysis 1999   s/p THA right  . History of basal cell carcinoma (BCC) excision    scalp/ neck  . History of Clostridium difficile infection   . History of DVT of lower extremity 1998;  04/ 2006   LLE  . History of external beam radiation therapy 2010    right breast cancer  . History of ITP    per pt hx long-term use prednisone for ITP,  1998 s/p total splenctomy  . History of kidney stones   . History of melanoma 1980s   s/p  right calf melanoma excision , per pt not malignant and localized ,  no recurrence  . History of paroxysmal supraventricular tachycardia    (02-23-2020  pt stated have not had any issues / symptoms in several years  . History of pulmonary embolus (PE) 04/2004  . History of right breast cancer 2010   08-10-2008 s/p  right lumpectomy w/ sln bx (partial mastectomy) , ER+, Stage I,  completed radiation 2010,  per pt no recurrance  . Hypertension   . Macular degeneration, bilateral    mild  . Mitral valve regurgitation 09/01/2016   cardiology-- dr Johnsie Cancel--- per note mild to moderate without stenosis,    . Mixed hyperlipidemia 05/18/2015  . Nocturia   . OA (osteoarthritis)    lumbar stenosis, radiculopathy, OA- L hip  . Personal history of colonic polyps    hyperplastic  . Renal calculus, right   . Right ureteral calculus   . Thoracic ascending aortic aneurysm [facility])    last CT angio chest in epic 11-29-2016  , 3.7cm    Past Surgical History:  Procedure Laterality Date  . ABDOMINAL HYSTERECTOMY  1973  . BREAST LUMPECTOMY  WITH RADIOACTIVE SEED AND SENTINEL LYMPH NODE BIOPSY Right 08-10-2008  '@[facility]'   . CATARACT EXTRACTION W/ INTRAOCULAR LENS  IMPLANT, BILATERAL Bilateral 2001  . CYSTOSCOPY WITH RETROGRADE PYELOGRAM, URETEROSCOPY AND STENT PLACEMENT Right 02/25/2020   Procedure: CYSTOSCOPY WITH RETROGRADE PYELOGRAM, URETEROSCOPY AND STENT EXCHANGE;  Surgeon: Alexis Frock, MD;  Location: [facility];  Service: Urology;  Laterality: Right;  75 MINS  . CYSTOSCOPY WITH STENT PLACEMENT Right 01/27/2020   Procedure: CYSTOSCOPY WITH STENT PLACEMENT;  Surgeon: Alexis Frock, MD;  Location: [facility];  Service: Urology;  Laterality: Right;  . CYSTOSCOPY/URETEROSCOPY/HOLMIUM LASER/STENT PLACEMENT Left 06-27-2018   dr Amalia Hailey,  '@[facility]'   . HOLMIUM LASER APPLICATION Right 7/35/3299   Procedure: HOLMIUM LASER APPLICATION;  Surgeon: Alexis Frock, MD;  Location: [facility];  Service: Urology;  Laterality: Right;  . I & D EXTREMITY Right 10/26/2017   Procedure: IRRIGATION AND DEBRIDEMENT RIGHT HAND;  Surgeon: Iran Planas, MD;  Location: [place];  Service: Orthopedics;  Laterality: Right;  . LUMBAR LAMINECTOMY/DECOMPRESSION MICRODISCECTOMY  04/11/2011   Procedure: LUMBAR LAMINECTOMY/DECOMPRESSION MICRODISCECTOMY;  Surgeon: [name], MD;  Location: [place] [facility];  Service: Neurosurgery;  Laterality: N/A;  Lumbar Five-Sacral One Microdiscectomy  . MELANOMA EXCISION Right 1980s   right calf  . SPLENECTOMY, TOTAL  1998  . TOTAL HIP ARTHROPLASTY Right 1999  . UPPER GASTROINTESTINAL ENDOSCOPY      Family History  Problem Relation Age of Onset  . Heart disease Mother   . Heart disease Father   . Alzheimer's disease Sister   . Dementia Sister   . Dementia Sister   . Heart disease Other        uncles  . Clotting disorder Maternal Uncle   . Breast cancer Paternal Aunt   . Cancer Paternal Aunt        breast  . Stroke Sister   . Dementia Sister        mean, carotid artery disease  . Colon cancer Neg Hx   . Anesthesia problems Neg Hx   . Hypotension Neg Hx   . Malignant hyperthermia Neg Hx   . Pseudochol deficiency Neg Hx     Social History   Socioeconomic History  . Marital status: Married    Spouse name: Not on file  . Number of children: 4  . Years of education: Not on file  . Highest education level: Not on file  Occupational History  . Occupation: Retired  Tobacco Use  . Smoking status: Never  . Smokeless tobacco: Never  Vaping Use  . Vaping Use: Never used  Substance and Sexual Activity  . Alcohol use: No    Alcohol/week: 0.0 standard drinks  . Drug use: Never  . Sexual activity: Not on file  Other Topics Concern  . Not on file  Social History Narrative   Married  1955   2 sons - '59, '61, 2 daughters '55, '57; 8 grandchildren; 1 great grand   I-ADLs      Social Determinants of Radio broadcast assistant Strain: Not on file  Food Insecurity: Not on file  Transportation Needs: Not on file  Physical Activity: Not on file  Stress: Not on file  Social Connections: Not on file  Intimate Partner Violence: Not on file    Outpatient Medications Prior to Visit  Medication Sig Dispense Refill  . Accu-Chek Softclix Lancets lancets Use to check sugar daily or as needed. 100 each 1  . B Complex Vitamins (VITAMIN B COMPLEX PO) Take  1 tablet by mouth daily.     . blood glucose meter kit and supplies KIT Dispense based on patient and insurance preference. Use up to four times daily as directed. Dx: E11.9 1 each 0  . Blood Pressure Monitoring (BLOOD PRESSURE KIT) DEVI 1 Device by Does not apply route daily as needed. Fill per patient insurance preference Dx: Hypertension 1 Device 0  . ezetimibe (ZETIA) 10 MG tablet Take 1 tablet (10 mg total) by mouth daily. 30 tablet 2  . furosemide (LASIX) 20 MG tablet TAKE 1 TABLET (20 MG TOTAL) BY MOUTH DAILY AS NEEDED FOR FLUID OR EDEMA. 30 tablet 2  . glucose blood (ACCU-CHEK GUIDE) test strip USE TO TEST BLOOD SUGAR UP TO 4 TIMES DAILY AS INSTRUCTED 100 strip 5  . hydrochlorothiazide (MICROZIDE) 12.5 MG capsule TAKE 1 CAPSULE (12.5 MG TOTAL) BY MOUTH AT BEDTIME. 30 capsule 2  . nitrofurantoin (MACRODANTIN) 50 MG capsule Take 1 capsule (50 mg total) by mouth at bedtime. 30 capsule 2  . potassium chloride SA (KLOR-CON M20) 20 MEQ tablet Take 1 tablet (20 mEq total) by mouth daily as needed. (Patient taking differently: Take 20 mEq by mouth daily as needed (supplement).) 90 tablet 1  . traMADol (ULTRAM) 50 MG tablet Take 1-2 tablets (50-100 mg total) by mouth every 6 (six) hours as needed for moderate pain or severe pain. Post-operatively 15 tablet 0  . valsartan (DIOVAN) 160 MG tablet TAKE 1 TABLET BY MOUTH EVERY DAY 30  tablet 2  . warfarin (COUMADIN) 5 MG tablet TAKE 1 TABLET DAILY EXCEPT TAKE 1/2 TABLET MON WED AND FRIDAYS OR TAKE AS DIRECTED BY ANTICOAGULATION CLINIC 90 tablet 1   No facility-administered medications prior to visit.    Allergies  Allergen Reactions  . Hydralazine Other (See Comments)    Joint and chest pain  . Amlodipine Swelling    "my whole body swelled"  . Carvedilol Swelling    "my whole body swelled"  . Cefuroxime Axetil     REACTION: Ddoes not remember reaction  . Chlorthalidone     Patient reported extreme weakness.  . Statins Other (See Comments)    Weakness, pain Lipitor and Pravachol Weakness, pain Lipitor and Pravachol  . Sulfonamide Derivatives Rash    REACTION: Rash    Review of Systems  Constitutional:  Negative for fever and malaise/fatigue.  HENT:  Negative for congestion.   Eyes:  Negative for blurred vision.  Respiratory:  Negative for shortness of breath.   Cardiovascular:  Negative for chest pain, palpitations and leg swelling.  Gastrointestinal:  Negative for abdominal pain, blood in stool and nausea.  Genitourinary:  Positive for vaginal pain. Negative for dysuria and frequency.       (+)Constant vaginal burning (-)Vaginal discharge  Musculoskeletal:  Negative for falls.  Skin:  Negative for rash.  Neurological:  Negative for dizziness, loss of consciousness and headaches.  Endo/Heme/Allergies:  Negative for environmental allergies.  Psychiatric/Behavioral:  Negative for depression. The patient is not nervous/anxious.       Objective:    Physical Exam Vitals and nursing note reviewed.  Constitutional:      General: She is not in acute distress.    Appearance: Normal appearance. She is not ill-appearing.  HENT:     Head: Normocephalic and atraumatic.     Right Ear: External ear normal.     Left Ear: External ear normal.  Eyes:     Extraocular Movements: Extraocular movements intact.     Pupils: Pupils  are equal, round, and reactive to  light.  Genitourinary:    Vagina: No vaginal discharge.     Comments: Gyn exam not done Recently done by urology=== dx with vaginal atrophy Skin:    General: Skin is warm and dry.  Neurological:     Mental Status: She is alert and oriented to person, place, and time.  Psychiatric:        Behavior: Behavior normal.        Judgment: Judgment normal.    BP 130/90 (BP Location: Right Arm, Patient Position: Sitting, Cuff Size: Normal)   Pulse 80   Temp 98 F (36.7 C) (Oral)   Resp 18   Ht '5\' 3"'  (1.6 m)   Wt 173 lb 12.8 oz (78.8 kg)   SpO2 96%   BMI 30.79 kg/m  Wt Readings from Last 3 Encounters:  09/30/20 173 lb 12.8 oz (78.8 kg)  07/06/20 170 lb 12.8 oz (77.5 kg)  06/23/20 168 lb (76.2 kg)    Diabetic Foot Exam - Simple   No data filed    Lab Results  Component Value Date   WBC 9.4 07/06/2020   HGB 13.4 07/06/2020   HCT 41.1 07/06/2020   PLT 305.0 07/06/2020   GLUCOSE 101 (H) 07/06/2020   CHOL 239 (H) 07/06/2020   TRIG (H) 07/06/2020    421.0 Triglyceride is over 400; calculations on Lipids are invalid.   HDL 38.10 (L) 07/06/2020   LDLDIRECT 115.0 07/06/2020   LDLCALC 143 (H) 12/19/2019   ALT 14 07/06/2020   AST 20 07/06/2020   NA 141 07/06/2020   K 4.6 07/06/2020   CL 102 07/06/2020   CREATININE 0.89 07/06/2020   BUN 28 (H) 07/06/2020   CO2 30 07/06/2020   TSH 5.06 (H) 07/06/2020   INR 1.8 (A) 09/07/2020   HGBA1C 6.8 (H) 07/06/2020   MICROALBUR 1.2 07/06/2020    Lab Results  Component Value Date   TSH 5.06 (H) 07/06/2020   Lab Results  Component Value Date   WBC 9.4 07/06/2020   HGB 13.4 07/06/2020   HCT 41.1 07/06/2020   MCV 91.6 07/06/2020   PLT 305.0 07/06/2020   Lab Results  Component Value Date   NA 141 07/06/2020   K 4.6 07/06/2020   CHLORIDE 109 08/12/2013   CO2 30 07/06/2020   GLUCOSE 101 (H) 07/06/2020   BUN 28 (H) 07/06/2020   CREATININE 0.89 07/06/2020   BILITOT 0.6 07/06/2020   ALKPHOS 56 07/06/2020   AST 20 07/06/2020    ALT 14 07/06/2020   PROT 7.3 07/06/2020   ALBUMIN 4.2 07/06/2020   CALCIUM 9.8 07/06/2020   ANIONGAP 12 01/27/2020   GFR 59.48 (L) 07/06/2020   Lab Results  Component Value Date   CHOL 239 (H) 07/06/2020   Lab Results  Component Value Date   HDL 38.10 (L) 07/06/2020   Lab Results  Component Value Date   LDLCALC 143 (H) 12/19/2019   Lab Results  Component Value Date   TRIG (H) 07/06/2020    421.0 Triglyceride is over 400; calculations on Lipids are invalid.   Lab Results  Component Value Date   CHOLHDL 6 07/06/2020   Lab Results  Component Value Date   HGBA1C 6.8 (H) 07/06/2020       Assessment & Plan:   Problem List Items Addressed This Visit   None    No orders of the defined types were placed in this encounter.   I, Dr. Roma Schanz, DO, personally preformed  the services described in this documentation.  All medical record entries made by the scribe were at my direction and in my presence.  I have reviewed the chart and discharge instructions (if applicable) and agree that the record reflects my personal performance and is accurate and complete. 09/30/2020   I,[name] Little,acting as a scribe for Ann Held, DO.,have documented all relevant documentation on the behalf of Ann Held, DO,as directed by  Ann Held, DO while in the presence of Ann Held, DO.   [name] [employer]

## 2020-09-30 NOTE — Patient Instructions (Signed)
Atrophic Vaginitis Atrophic vaginitis is a condition in which the tissues that line the vagina become dry and thin. This condition is most common in women who have stopped having regular menstrual periods (are in menopause). This usually starts when a woman is 45 to 85 years old. That is the time when a woman's estrogen levels begin to decrease. Estrogen is a female hormone. It helps to keep the tissues of the vagina moist. It stimulates the vagina to produce a clear fluid that lubricates the vagina for sex. This fluid also protects the vagina from infection. Lack of estrogen can cause the lining of the vagina to get thinner and dryer. The vagina may also shrink in size. It may become less elastic. Atrophic vaginitis tends to get worse over time as a woman's estrogen level drops. What are the causes? This condition is caused by the normal drop in estrogen that happens around the time of menopause. What increases the risk? Certain conditions or situations may lower a woman's estrogen level, leading to a higher risk for atrophic vaginitis. You are more likely to develop this condition if: You are taking medicines that block estrogen. You have had your ovaries removed. You are being treated for cancer with radiation or medicines (chemotherapy). You have given birth or are breastfeeding. You are older than age 50. You smoke. What are the signs or symptoms? Symptoms of this condition include: Pain, soreness, a feeling of pressure, or bleeding during sex (dyspareunia). Vaginal burning, irritation, or itching. Pain or bleeding when a speculum is used in a vaginal exam. Having burning pain while urinating. Vaginal discharge. In some cases, there are no symptoms. How is this diagnosed? This condition is diagnosed based on your medical history and a physical exam. This will include a pelvic exam that checks the vaginal tissues. Though rare, you may also have other tests, including: A urine test. A test  that checks the acid balance in your vagina (acid balance test). How is this treated? Treatment for this condition depends on how severe your symptoms are. Treatment may include: Using an over-the-counter vaginal lubricant before sex. Using a long-acting vaginal moisturizer. Using low-dose estrogen for moderate to severe symptoms that do not respond to other treatments. Options include creams, tablets, and inserts (vaginal rings). Before you use a vaginal estrogen, tell your health care provider if you have a history of: Breast cancer. Endometrial cancer. Blood clots. If you are not sexually active and your symptoms are very mild, you may not need treatment. Follow these instructions at home: Medicines Take over-the-counter and prescription medicines only as told by your health care provider. Do not use herbal or alternative medicines unless your health care provider says that you can. Use over-the-counter creams, lubricants, or moisturizers for dryness only as told by your health care provider. General instructions If your atrophic vaginitis is caused by menopause, discuss all of your menopause symptoms and treatment options with your health care provider. Do not douche. Do not use products that can make your vagina dry. These include: Scented feminine sprays. Scented tampons. Scented soaps. Vaginal sex can help to improve blood flow and elasticity of vaginal tissue. If you choose to have sex and it hurts, try using a water-soluble lubricant or moisturizer right before having sex. Contact a health care provider if: Your discharge looks different than normal. Your vagina has an unusual smell. You have new symptoms. Your symptoms do not improve with treatment. Your symptoms get worse. Summary Atrophic vaginitis is a condition in   which the tissues that line the vagina become dry and thin. It is most common in women who have stopped having regular menstrual periods (are in  menopause). Treatment options include using vaginal lubricants and low-dose vaginal estrogen. Contact a health care provider if your vagina has an unusual smell, or if your symptoms get worse or do not improve after treatment. This information is not intended to replace advice given to you by your health care provider. Make sure you discuss any questions you have with your health care provider. Document Revised: 07/17/2019 Document Reviewed: 07/17/2019 Elsevier Patient Education  2022 [employer]  

## 2020-10-05 ENCOUNTER — Ambulatory Visit: Payer: Medicare PPO

## 2020-10-05 ENCOUNTER — Other Ambulatory Visit: Payer: Self-pay

## 2020-10-05 DIAGNOSIS — Z7901 Long term (current) use of anticoagulants: Secondary | ICD-10-CM | POA: Diagnosis not present

## 2020-10-05 LAB — POCT INR: INR: 3.4 — AB (ref 2.0–3.0)

## 2020-10-05 NOTE — Patient Instructions (Addendum)
Pre visit review using our clinic review tool, if applicable. No additional management support is needed unless otherwise documented below in the visit note.  Hold dose today and then continue 1 tablet daily except take 1/2 tablet on Monday Wed and Fridays.  Re-check in 4 weeks.

## 2020-10-05 NOTE — Progress Notes (Signed)
Medical screening examination/treatment/procedure(s) were performed by non-physician practitioner and as supervising physician I was immediately available for consultation/collaboration. I agree with above. Tresia Revolorio, MD   

## 2020-10-06 LAB — URINE CYTOLOGY ANCILLARY ONLY
Bacterial Vaginitis-Urine: NEGATIVE
Candida Urine: POSITIVE — AB
Chlamydia: NEGATIVE
Comment: NEGATIVE
Comment: NEGATIVE
Comment: NORMAL
Neisseria Gonorrhea: NEGATIVE
Trichomonas: NEGATIVE

## 2020-10-10 ENCOUNTER — Other Ambulatory Visit: Payer: Self-pay | Admitting: Family Medicine

## 2020-10-13 ENCOUNTER — Other Ambulatory Visit: Payer: Self-pay | Admitting: Family Medicine

## 2020-10-13 DIAGNOSIS — Z7901 Long term (current) use of anticoagulants: Secondary | ICD-10-CM

## 2020-10-14 ENCOUNTER — Other Ambulatory Visit: Payer: Self-pay

## 2020-10-14 ENCOUNTER — Other Ambulatory Visit (INDEPENDENT_AMBULATORY_CARE_PROVIDER_SITE_OTHER): Payer: Medicare PPO

## 2020-10-14 DIAGNOSIS — E1165 Type 2 diabetes mellitus with hyperglycemia: Secondary | ICD-10-CM | POA: Diagnosis not present

## 2020-10-14 DIAGNOSIS — E1169 Type 2 diabetes mellitus with other specified complication: Secondary | ICD-10-CM | POA: Diagnosis not present

## 2020-10-14 DIAGNOSIS — E785 Hyperlipidemia, unspecified: Secondary | ICD-10-CM | POA: Diagnosis not present

## 2020-10-14 LAB — COMPREHENSIVE METABOLIC PANEL
ALT: 12 U/L (ref 0–35)
AST: 16 U/L (ref 0–37)
Albumin: 4 g/dL (ref 3.5–5.2)
Alkaline Phosphatase: 53 U/L (ref 39–117)
BUN: 29 mg/dL — ABNORMAL HIGH (ref 6–23)
CO2: 32 mEq/L (ref 19–32)
Calcium: 9.8 mg/dL (ref 8.4–10.5)
Chloride: 103 mEq/L (ref 96–112)
Creatinine, Ser: 0.89 mg/dL (ref 0.40–1.20)
GFR: 59.36 mL/min — ABNORMAL LOW (ref >60.00)
Glucose, Bld: 149 mg/dL — ABNORMAL HIGH (ref 70–99)
Potassium: 4.1 mEq/L (ref 3.5–5.1)
Sodium: 142 mEq/L (ref 135–145)
Total Bilirubin: 0.5 mg/dL (ref 0.2–1.2)
Total Protein: 7.2 g/dL (ref 6.0–8.3)

## 2020-10-14 LAB — HEMOGLOBIN A1C: Hgb A1c MFr Bld: 6.9 % — ABNORMAL HIGH (ref 4.6–6.5)

## 2020-10-14 LAB — LIPID PANEL
Cholesterol: 203 mg/dL — ABNORMAL HIGH (ref 0–200)
HDL: 37.3 mg/dL — ABNORMAL LOW (ref >39.00)
NonHDL: 165.46
Total CHOL/HDL Ratio: 5
Triglycerides: 392 mg/dL — ABNORMAL HIGH (ref 0.0–149.0)
VLDL: 78.4 mg/dL — ABNORMAL HIGH (ref 0.0–40.0)

## 2020-10-14 LAB — LDL CHOLESTEROL, DIRECT: Direct LDL: 95 mg/dL

## 2020-10-15 ENCOUNTER — Other Ambulatory Visit: Payer: Self-pay | Admitting: Family Medicine

## 2020-10-15 DIAGNOSIS — E785 Hyperlipidemia, unspecified: Secondary | ICD-10-CM

## 2020-10-27 ENCOUNTER — Ambulatory Visit: Payer: Medicare PPO

## 2020-10-27 NOTE — Progress Notes (Deleted)
Subjective:   [name] Little is a 87 y.o. female who presents for Medicare Annual (Subsequent) preventive examination.  I connected with *** today by telephone and verified that I am speaking with the correct person using two identifiers. Location patient: home Location provider: work Persons participating in the virtual visit: patient, Marine scientist.    I discussed the limitations, risks, security and privacy concerns of performing an evaluation and management service by telephone and the availability of in person appointments. I also discussed with the patient that there may be a patient responsible charge related to this service. The patient expressed understanding and verbally consented to this telephonic visit.    Interactive audio and video telecommunications were attempted between this provider and patient, however failed, due to patient having technical difficulties OR patient did not have access to video capability.  We continued and completed visit with audio only.  Some vital signs may be absent or patient reported.   Time Spent with patient on telephone encounter: *** minutes   Review of Systems    ***       Objective:    There were no vitals filed for this visit. There is no height or weight on file to calculate BMI.  Advanced Directives 02/25/2020 01/28/2020 03/10/2019 01/29/2018 10/26/2017 10/21/2017 10/18/2017  Does Patient Have a Medical Advance Directive? Yes No No No No No No  Type of Advance Directive [place] in Chart? - - - - - - -  Would patient like information on creating a medical advance directive? - No - Patient declined No - Patient declined No - Patient declined No - Patient declined - No - Patient declined  Pre-existing out of facility DNR order (yellow form or pink MOST form) - - - - - - -    Current Medications (verified) Outpatient Encounter Medications as of 10/27/2020  Medication  Sig   Accu-Chek Softclix Lancets lancets Use to check sugar daily or as needed.   B Complex Vitamins (VITAMIN B COMPLEX PO) Take 1 tablet by mouth daily.    blood glucose meter kit and supplies KIT Dispense based on patient and insurance preference. Use up to four times daily as directed. Dx: E11.9   Blood Pressure Monitoring (BLOOD PRESSURE KIT) DEVI 1 Device by Does not apply route daily as needed. Fill per patient insurance preference Dx: Hypertension   conjugated estrogens (PREMARIN) vaginal cream Place 1 Applicatorful vaginally daily.   ezetimibe (ZETIA) 10 MG tablet TAKE 1 TABLET BY MOUTH EVERY DAY   fluconazole (DIFLUCAN) 150 MG tablet 1 po x1, may repeat in 3 days prn   furosemide (LASIX) 20 MG tablet TAKE 1 TABLET (20 MG TOTAL) BY MOUTH DAILY AS NEEDED FOR FLUID OR EDEMA.   glucose blood (ACCU-CHEK GUIDE) test strip USE TO TEST BLOOD SUGAR UP TO 4 TIMES DAILY AS INSTRUCTED   hydrochlorothiazide (MICROZIDE) 12.5 MG capsule TAKE 1 CAPSULE (12.5 MG TOTAL) BY MOUTH AT BEDTIME.   nitrofurantoin (MACRODANTIN) 50 MG capsule Take 1 capsule (50 mg total) by mouth at bedtime.   potassium chloride SA (KLOR-CON M20) 20 MEQ tablet Take 1 tablet (20 mEq total) by mouth daily as needed. (Patient taking differently: Take 20 mEq by mouth daily as needed (supplement).)   traMADol (ULTRAM) 50 MG tablet Take 1-2 tablets (50-100 mg total) by mouth every 6 (six) hours as needed for moderate pain or severe pain. Post-operatively  valsartan (DIOVAN) 160 MG tablet TAKE 1 TABLET BY MOUTH EVERY DAY   warfarin (COUMADIN) 5 MG tablet TAKE 1 TABLET DAILY EXCEPT TAKE 1/2 TABLET MON WED AND FRIDAYS OR TAKE AS DIRECTED BY ANTICOAGULATION CLINIC   No facility-administered encounter medications on file as of 10/27/2020.    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   anticoagulated on coumadin   GAD (generalized anxiety disorder)    Heart murmur    Hemorrhoids    History of avascular necrosis of capital femoral epiphysis 1999   s/p THA right   History of basal cell carcinoma (BCC) excision    scalp/ neck   History of Clostridium difficile infection    History of DVT of lower extremity 1998;  04/ 2006   LLE   History of external beam radiation therapy 2010   right breast cancer   History of ITP    per pt hx long-term use prednisone for ITP,  1998 s/p total splenctomy   History of kidney stones    History of melanoma 1980s   s/p  right calf melanoma excision , per pt not malignant and localized ,  no recurrence   History of paroxysmal supraventricular tachycardia    (02-23-2020  pt stated have not had any issues / symptoms in several years   History of pulmonary embolus (PE) 04/2004   History of right breast cancer 2010   08-10-2008 s/p  right lumpectomy w/ sln bx (partial mastectomy) , ER+, Stage I,  completed radiation 2010,  per pt no recurrance   Hypertension    Macular degeneration, bilateral    mild   Mitral valve regurgitation 09/01/2016   cardiology-- dr Johnsie Cancel--- per note mild to moderate without stenosis,     Mixed hyperlipidemia 05/18/2015   Nocturia    OA (osteoarthritis)    lumbar stenosis, radiculopathy, OA- L hip   Personal history of colonic polyps    hyperplastic   Renal calculus, right    Right ureteral calculus    Thoracic ascending aortic aneurysm ([place])    last CT angio chest in epic 11-29-2016  , 3.7cm   Past Surgical History:  Procedure Laterality Date   ABDOMINAL HYSTERECTOMY  1973   BREAST LUMPECTOMY WITH RADIOACTIVE SEED AND SENTINEL LYMPH NODE BIOPSY Right 08-10-2008  '@[facility]'    CATARACT EXTRACTION W/ INTRAOCULAR LENS  IMPLANT, BILATERAL  Bilateral 2001   CYSTOSCOPY WITH RETROGRADE PYELOGRAM, URETEROSCOPY AND STENT PLACEMENT Right 02/25/2020   Procedure: CYSTOSCOPY WITH RETROGRADE PYELOGRAM, URETEROSCOPY AND STENT EXCHANGE;  Surgeon: Alexis Frock, MD;  Location: [facility];  Service: Urology;  Laterality: Right;  75 MINS   CYSTOSCOPY WITH STENT PLACEMENT Right 01/27/2020   Procedure: CYSTOSCOPY WITH STENT PLACEMENT;  Surgeon: Alexis Frock, MD;  Location: [facility];  Service: Urology;  Laterality: Right;   CYSTOSCOPY/URETEROSCOPY/HOLMIUM LASER/STENT PLACEMENT Left 06-27-2018  dr Amalia Hailey,  '@[facility]'    HOLMIUM LASER APPLICATION Right 2/70/3500   Procedure: HOLMIUM LASER APPLICATION;  Surgeon: Alexis Frock, MD;  Location[facility];  Service: Urology;  Laterality: Right;   I & D EXTREMITY Right 10/26/2017   Procedure: IRRIGATION AND DEBRIDEMENT RIGHT HAND;  Surgeon: Iran Planas, MD;  Location:  [facility];  Service: Orthopedics;  Laterality: Right;   LUMBAR LAMINECTOMY/DECOMPRESSION MICRODISCECTOMY  04/11/2011   Procedure: LUMBAR LAMINECTOMY/DECOMPRESSION MICRODISCECTOMY;  Surgeon: [name], MD;  Location: [facility];  Service: Neurosurgery;  Laterality: N/A;  Lumbar Five-Sacral One Microdiscectomy   MELANOMA EXCISION Right 1980s   right calf   SPLENECTOMY, TOTAL  1998   TOTAL HIP ARTHROPLASTY Right 1999   UPPER GASTROINTESTINAL ENDOSCOPY     Family History  Problem Relation Age of Onset   Heart disease Mother    Heart disease Father    Alzheimer's disease Sister    Dementia Sister    Dementia Sister    Heart disease Other        uncles   Clotting disorder Maternal Uncle    Breast cancer Paternal 48    Cancer Paternal Aunt        breast   Stroke Sister    Dementia Sister        mean, carotid artery disease   Colon cancer Neg Hx    Anesthesia problems Neg Hx    Hypotension Neg Hx    Malignant hyperthermia Neg Hx    Pseudochol deficiency Neg Hx    Social History   Socioeconomic  History   Marital status: Married    Spouse name: Not on file   Number of children: 4   Years of education: Not on file   Highest education level: Not on file  Occupational History   Occupation: Retired  Tobacco Use   Smoking status: Never   Smokeless tobacco: Never  Vaping Use   Vaping Use: Never used  Substance and Sexual Activity   Alcohol use: No    Alcohol/week: 0.0 standard drinks   Drug use: Never   Sexual activity: Not on file  Other Topics Concern   Not on file  Social History Narrative   Married 1955   2 sons - '59, '61, 2 daughters '55, '57; 8 grandchildren; 1 great grand   I-ADLs      Social Determinants of Radio broadcast assistant Strain: Not on file  Food Insecurity: Not on file  Transportation Needs: Not on file  Physical Activity: Not on file  Stress: Not on file  Social Connections: Not on file    Tobacco Counseling Counseling given: Not Answered   Clinical Intake:                Diabetes:  Is the patient diabetic?  Yes  If diabetic, was a CBG obtained today?  No  Did the patient bring in their glucometer from home?  {YES/NO:21197} How often do you monitor your CBG's? ***.   Financial Strains and Diabetes Management:  Are you having any financial strains with the device, your supplies or your medication? {YES/NO:21197}.  Does the patient want to be seen by Chronic Care Management for management of their diabetes?  {YES/NO:21197} Would the patient like to be referred to a Nutritionist or for Diabetic Management?  {YES/NO:21197}  Diabetic Exams:  Diabetic Eye Exam: Completed ***. Overdue for diabetic eye exam. Pt has been advised about the importance in completing this exam. A referral has been placed today. Message sent to referral coordinator for scheduling purposes. Advised pt to expect a call from our office re: appt.  Diabetic Foot Exam: Completed 07/06/2020.          Activities of Daily Living In your present state of  health, do you have any difficulty performing the following activities: 07/06/2020 02/25/2020  Hearing? N N  Vision? N N  Difficulty concentrating or making decisions? N N  Walking or climbing stairs? Y N  Dressing or bathing? - N  Doing errands, shopping? N -  Some recent data might be hidden    Patient Care Team: Carollee Herter, Alferd Apa, DO as PCP - General (Family Medicine) Josue Hector, MD as PCP - Cardiology (Cardiology) Zadie Rhine Clent Demark, MD as Consulting Physician (Ophthalmology) Carollee Herter, Alferd Apa, DO as Consulting Physician (Family Medicine) Alexis Frock, MD as Consulting Physician (Urology) Rutherford Guys, MD as Consulting Physician (Ophthalmology) Druscilla Brownie, MD as Referring Physician (Dermatology)  Indicate any recent Medical Services you may have received from other than [facility] providers in the past year (date may be approximate).     Assessment:   This is a routine wellness examination for Jaydalynn.  Hearing/Vision screen No results found.  Dietary issues and exercise activities discussed:     Goals Addressed   None    Depression Screen PHQ 2/9 Scores 07/06/2020 06/23/2020 03/10/2019 01/29/2018 10/18/2017 04/17/2016 07/16/2015  PHQ - 2 Score 0 0 0 0 0 0 0    Fall Risk Fall Risk  07/06/2020 06/23/2020 03/10/2019 01/29/2018 10/18/2017  Falls in the past year? 0 0 0 0 No  Number falls in past yr: 0 0 0 - -  Injury with Fall? 0 0 0 - -  Follow up Falls evaluation completed - Education provided;Falls prevention discussed - -    FALL RISK PREVENTION PERTAINING TO THE HOME:  Any stairs in or around the home? {YES/NO:21197} If so, are there any without handrails? {YES/NO:21197} Home free of loose throw rugs in walkways, pet beds, electrical cords, etc? {YES/NO:21197} Adequate lighting in your home to reduce risk of falls? {YES/NO:21197}  ASSISTIVE DEVICES UTILIZED TO PREVENT FALLS:  Life alert? {YES/NO:21197} Use of a cane, walker or w/c? {YES/NO:21197} Grab bars in  the bathroom? {YES/NO:21197} Shower chair or bench in shower? {YES/NO:21197} Elevated toilet seat or a handicapped toilet? {YES/NO:21197}  TIMED UP AND GO:  Was the test performed? {YES/NO:21197}.  Length of time to ambulate 10 feet: *** sec.   {Appearance of Gait:2101803}  Cognitive Function: MMSE - Mini Mental State Exam 01/29/2018 04/17/2016  Orientation to time 5 5  Orientation to Place 5 5  Registration 3 3  Attention/ Calculation 4 5  Recall 1 2  Language- name 2 objects 2 2  Language- repeat 1 1  Language- follow 3 step command 3 3  Language- read & follow direction 1 1  Write a sentence 1 1  Copy design 1 1  Total score 27 29        Immunizations Immunization History  Administered Date(s) Administered   Fluad Quad(high Dose 65+) 10/24/2018, 10/30/2019   Influenza Split 11/14/2010   Influenza Whole 11/08/2005, 10/28/2007, 11/23/2008, 11/26/2009   Influenza, High Dose Seasonal PF 10/19/2015, 10/16/2016, 11/22/2017   Influenza,inj,Quad PF,6+ Mos 11/26/2012, 09/22/2013, 10/20/2014   PFIZER(Purple Top)SARS-COV-2 Vaccination 02/20/2019, 03/13/2019, 11/26/2019   Pneumococcal Conjugate-13 04/01/2013   Pneumococcal Polysaccharide-23 10/30/2001, 07/27/2009, 07/06/2020   Td 11/27/2001   Tdap 10/18/2017   Tetanus 04/01/2013   Zoster Recombinat (Shingrix) 03/13/2017, 09/02/2018    TDAP status: Up to date  Flu Vaccine status: Due, Education has been provided regarding the importance of this vaccine. Advised may receive this vaccine at local pharmacy or Health Dept. Aware to provide a copy of the vaccination record if obtained from local pharmacy or Health Dept. Verbalized acceptance and understanding.  Pneumococcal vaccine status:  Up to date  {Covid-19 vaccine status:2101808}  Qualifies for Shingles Vaccine? No   Zostavax completed No   Shingrix Completed?: Yes  Screening Tests Health Maintenance  Topic Date Due   OPHTHALMOLOGY EXAM  06/17/2019   COVID-19  Vaccine (4 - Booster for Pfizer series) 02/18/2020   INFLUENZA VACCINE  08/30/2020   MAMMOGRAM  10/14/2020   DEXA SCAN  07/06/2021 (Originally 04/25/2018)   HEMOGLOBIN A1C  04/13/2021   FOOT EXAM  07/06/2021   TETANUS/TDAP  10/19/2027   Zoster Vaccines- Shingrix  Completed   HPV VACCINES  Aged Out    Health Maintenance  Health Maintenance Due  Topic Date Due   OPHTHALMOLOGY EXAM  06/17/2019   COVID-19 Vaccine (4 - Booster for [place] series) 02/18/2020   INFLUENZA VACCINE  08/30/2020   MAMMOGRAM  10/14/2020    Colorectal cancer screening: No longer required.   Mammogram status:Scheduled for 10/29/2020  {Bone Density status:21018021}  Lung Cancer Screening: (Low Dose CT Chest recommended if Age 63-80 years, 30 pack-year currently smoking OR have quit w/in 15years.) does not qualify.    Additional Screening:  Hepatitis C Screening: does not qualify  Vision Screening: Recommended annual ophthalmology exams for early detection of glaucoma and other disorders of the eye. Is the patient up to date with their annual eye exam?  {YES/NO:21197} Who is the provider or what is the name of the office in which the patient attends annual eye exams? *** If pt is not established with a provider, would they like to be referred to a provider to establish care? {YES/NO:21197}.   Dental Screening: Recommended annual dental exams for proper oral hygiene  Community Resource Referral / Chronic Care Management: CRR required this visit?  {YES/NO:21197}  CCM required this visit?  {YES/NO:21197}     Plan:     I have personally reviewed and noted the following in the patient's chart:   Medical and social history Use of alcohol, tobacco or illicit drugs  Current medications and supplements including opioid prescriptions.  Functional ability and status Nutritional status Physical activity Advanced directives List of other physicians Hospitalizations, surgeries, and ER visits in previous 12  months Vitals Screenings to include cognitive, depression, and falls Referrals and appointments  In addition, I have reviewed and discussed with patient certain preventive protocols, quality metrics, and best practice recommendations. A written personalized care plan for preventive services as well as general preventive health recommendations were provided to patient.   Due to this being a telephonic visit, the after visit summary with patients personalized plan was offered to patient via mail or my-chart. ***Patient declined at this time./ Patient would like to access on my-chart/ per request, patient was mailed a copy of AVS./ Patient preferred to pick up at office at next visit.   Marta Antu, LPN   8/33/3832  Nurse Health Advisor  Nurse Notes: ***

## 2020-10-29 ENCOUNTER — Ambulatory Visit: Payer: Medicare PPO

## 2020-11-02 ENCOUNTER — Ambulatory Visit: Payer: Medicare PPO

## 2020-11-02 ENCOUNTER — Other Ambulatory Visit: Payer: Self-pay

## 2020-11-02 DIAGNOSIS — Z7901 Long term (current) use of anticoagulants: Secondary | ICD-10-CM | POA: Diagnosis not present

## 2020-11-02 LAB — POCT INR: INR: 2.2 (ref 2.0–3.0)

## 2020-11-02 NOTE — Progress Notes (Signed)
Medical screening examination/treatment/procedure(s) were performed by non-physician practitioner and as supervising physician I was immediately available for consultation/collaboration. I agree with above. Dawana Asper, MD   

## 2020-11-02 NOTE — Patient Instructions (Addendum)
Pre visit review using our clinic review tool, if applicable. No additional management support is needed unless otherwise documented below in the visit note.  Continue 1 tablet daily except take 1/2 tablet on Monday Wed and Fridays.  Re-check in 5 weeks.

## 2020-11-08 ENCOUNTER — Encounter: Payer: Self-pay | Admitting: Family Medicine

## 2020-11-08 ENCOUNTER — Ambulatory Visit (INDEPENDENT_AMBULATORY_CARE_PROVIDER_SITE_OTHER): Payer: Medicare PPO

## 2020-11-08 ENCOUNTER — Other Ambulatory Visit: Payer: Self-pay

## 2020-11-08 DIAGNOSIS — Z23 Encounter for immunization: Secondary | ICD-10-CM | POA: Diagnosis not present

## 2020-11-26 ENCOUNTER — Ambulatory Visit
Admission: RE | Admit: 2020-11-26 | Discharge: 2020-11-26 | Disposition: A | Discharge status: Home / Self Care | Payer: Medicare PPO | Source: Ambulatory Visit | Attending: Family Medicine | Admitting: Family Medicine

## 2020-11-26 ENCOUNTER — Other Ambulatory Visit: Payer: Self-pay

## 2020-11-26 DIAGNOSIS — Z1231 Encounter for screening mammogram for malignant neoplasm of breast: Secondary | ICD-10-CM | POA: Diagnosis not present

## 2020-12-06 NOTE — Progress Notes (Deleted)
Cardiology Office Note   Date:  12/06/2020   ID:  [name] Little, [name] 10-25-1933, MRN [id number]  PCP:  Carollee Herter, Alferd Apa, DO  Cardiologist:   Jenkins Rouge, MD   No chief complaint on file.     History of Present Illness:  87 y.o. first seen August 2018 for murmur and mitral valve disease.   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 CRF;s HTN, elevated lipids and DM.  Intolerant to statins with lipitor and pravachol causing weakness. 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 Her first cousin is Copywriter, advertising who I care for as well   No cardiac complaints  L[place] nice land in 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   anticoagulated on coumadin   GAD (generalized anxiety disorder)    Heart murmur    Hemorrhoids    History of avascular necrosis of capital femoral epiphysis 1999   s/p THA right   History of basal cell carcinoma (BCC) excision    scalp/ neck   History of Clostridium difficile infection    History of DVT of lower extremity 1998;  04/ 2006   LLE   History of external beam radiation therapy 2010   right breast cancer   History of ITP    per pt hx long-term use prednisone for ITP,  1998 s/p total splenctomy   History of kidney stones    History of melanoma 1980s   s/p  right calf melanoma excision , per pt not malignant and  localized ,  no recurrence   History of paroxysmal supraventricular tachycardia    (02-23-2020  pt stated have not had any issues / symptoms in several years   History of pulmonary embolus (PE) 04/2004   History of right breast cancer 2010   08-10-2008 s/p  right lumpectomy w/ sln bx (partial mastectomy) , ER+, Stage I,  completed radiation 2010,  per pt no recurrance   Hypertension    Macular degeneration, bilateral    mild   Mitral valve regurgitation 09/01/2016   cardiology-- dr Johnsie Cancel--- per note mild to moderate without stenosis,     Mixed hyperlipidemia 05/18/2015   Nocturia    OA (osteoarthritis)    lumbar stenosis, radiculopathy, OA- L hip   Personal history of colonic polyps    hyperplastic   Renal calculus, right    Right ureteral calculus    Thoracic ascending aortic aneurysm    last CT angio chest in epic 11-29-2016  , 3.7cm    Past Surgical History:  Procedure Laterality Date   ABDOMINAL HYSTERECTOMY  1973   BREAST LUMPECTOMY WITH RADIOACTIVE SEED AND SENTINEL LYMPH NODE BIOPSY Right 08/10/2008   _0    CATARACT EXTRACTION W/ INTRAOCULAR LENS  IMPLANT, BILATERAL Bilateral 2001  CYSTOSCOPY WITH RETROGRADE PYELOGRAM, URETEROSCOPY AND STENT PLACEMENT Right 02/25/2020   Procedure: CYSTOSCOPY WITH RETROGRADE PYELOGRAM, URETEROSCOPY AND STENT EXCHANGE;  Surgeon: Alexis Frock, MD;  [facility]: [facility];  Service: Urology;  Laterality: Right;  75 MINS   CYSTOSCOPY WITH STENT PLACEMENT Right 01/27/2020   Procedure: CYSTOSCOPY WITH STENT PLACEMENT;  Surgeon: Alexis Frock, MD;  Location: [facility];  Service: Urology;  Laterality: Right;   CYSTOSCOPY/URETEROSCOPY/HOLMIUM LASER/STENT PLACEMENT Left 06-27-2018  dr Amalia Hailey,  _0    HOLMIUM LASER APPLICATION Right 44/69/5072   Procedure: HOLMIUM LASER APPLICATION;  Surgeon: Alexis Frock, MD;  Location: [facility];  Service: Urology;  Laterality: Right;   I & D EXTREMITY Right 10/26/2017    Procedure: IRRIGATION AND DEBRIDEMENT RIGHT HAND;  Surgeon: Iran Planas, MD;  Location: [place];  Service: Orthopedics;  Laterality: Right;   LUMBAR LAMINECTOMY/DECOMPRESSION MICRODISCECTOMY  04/11/2011   Procedure: LUMBAR LAMINECTOMY/DECOMPRESSION MICRODISCECTOMY;  Surgeon: [name], MD;  Location: [place] [facility];  Service: Neurosurgery;  Laterality: N/A;  Lumbar Five-Sacral One Microdiscectomy   MELANOMA EXCISION Right 1980s   right calf   SPLENECTOMY, TOTAL  1998   TOTAL HIP ARTHROPLASTY Right 1999   UPPER GASTROINTESTINAL ENDOSCOPY       Current Outpatient Medications  Medication Sig Dispense Refill   Accu-Chek Softclix Lancets lancets Use to check sugar daily or as needed. 100 each 1   B Complex Vitamins (VITAMIN B COMPLEX PO) Take 1 tablet by mouth daily.      blood glucose meter kit and supplies KIT Dispense based on patient and insurance preference. Use up to four times daily as directed. Dx: E11.9 1 each 0   Blood Pressure Monitoring (BLOOD PRESSURE KIT) DEVI 1 Device by Does not apply route daily as needed. Fill per patient insurance preference Dx: Hypertension 1 Device 0   conjugated estrogens (PREMARIN) vaginal cream Place 1 Applicatorful vaginally daily. 42.5 g 12   ezetimibe (ZETIA) 10 MG tablet TAKE 1 TABLET BY MOUTH EVERY DAY 30 tablet 2   fluconazole (DIFLUCAN) 150 MG tablet 1 po x1, may repeat in 3 days prn 2 tablet 0   furosemide (LASIX) 20 MG tablet TAKE 1 TABLET (20 MG TOTAL) BY MOUTH DAILY AS NEEDED FOR FLUID OR EDEMA. 30 tablet 2   glucose blood (ACCU-CHEK GUIDE) test strip USE TO TEST BLOOD SUGAR UP TO 4 TIMES DAILY AS INSTRUCTED 100 strip 5   hydrochlorothiazide (MICROZIDE) 12.5 MG capsule TAKE 1 CAPSULE (12.5 MG TOTAL) BY MOUTH AT BEDTIME. 30 capsule 2   nitrofurantoin (MACRODANTIN) 50 MG capsule Take 1 capsule (50 mg total) by mouth at bedtime. 30 capsule 2   potassium chloride SA (KLOR-CON M20) 20 MEQ tablet Take 1 tablet (20 mEq total) by mouth daily as needed.  (Patient taking differently: Take 20 mEq by mouth daily as needed (supplement).) 90 tablet 1   traMADol (ULTRAM) 50 MG tablet Take 1-2 tablets (50-100 mg total) by mouth every 6 (six) hours as needed for moderate pain or severe pain. Post-operatively 15 tablet 0   valsartan (DIOVAN) 160 MG tablet TAKE 1 TABLET BY MOUTH EVERY DAY 30 tablet 2   warfarin (COUMADIN) 5 MG tablet TAKE 1 TABLET DAILY EXCEPT TAKE 1/2 TABLET MON WED AND FRIDAYS OR TAKE AS DIRECTED BY ANTICOAGULATION CLINIC 90 tablet 1   No current facility-administered medications for this visit.    Allergies:   Hydralazine, Amlodipine, Carvedilol, Cefuroxime axetil, Chlorthalidone, Statins, and Sulfonamide derivatives    Social History:  The patient  reports that she has never smoked. She has never used smokeless tobacco. She reports that she does not drink alcohol and does not use drugs.   Family History:  The patient's family history includes Alzheimer's disease in her sister; Breast cancer in her paternal aunt; Cancer in her paternal aunt; Clotting disorder in her maternal uncle; Dementia in her sister, sister, and sister; Heart disease in her father, mother, and another family member; Stroke in her sister.    ROS:  Please see the history of present illness.   Otherwise, review of systems are positive for none.   All other systems are reviewed and negative.    PHYSICAL EXAM: VS:  There were no vitals taken for this visit. , BMI There is no height or weight on file to calculate BMI. Affect appropriate Healthy:  appears stated age 5: normal Neck supple with no adenopathy JVP normal no bruits no thyromegaly Lungs clear with no wheezing and good diaphragmatic motion Heart:  S1/S2 soft apical MR murmur, no rub, gallop or click PMI normal Abdomen: benighn, BS positve, no tenderness, no AAA no bruit.  No HSM or HJR Distal pulses intact with no bruits Plus one bilateral ankle  edema Neuro non-focal Skin warm and dry No  muscular weakness    EKG:  09/06/17 SR rate 71 LAD otherwise normal 10/6 21 SR rate 84 LAD otherwise normal no change    Recent Labs: 07/06/2020: Hemoglobin 13.4; Platelets 305.0; TSH 5.06 10/14/2020: ALT 12; BUN 29; Creatinine, Ser 0.89; Potassium 4.1; Sodium 142     Wt Readings from Last 3 Encounters:  09/30/20 173 lb 12.8 oz (78.8 kg)  07/06/20 170 lb 12.8 oz (77.5 kg)  06/23/20 168 lb (76.2 kg)      Other studies Reviewed: Additional studies/ records that were reviewed today include: Notes Dr [name] Echo done July 2018 ECG's .    ASSESSMENT AND PLAN:  1. MR- mild to moderate last echo 08/24/16 will update  2. HTN improved continue current meds  3. DM:  Discussed low carb diet.  Target hemoglobin A1c is 6.5 or less.  Continue current medications. 4. Cholesterol continue welchol  intolerant to statins No vascular dx LDL 95 5. Hypercoagulability: on coumadin followed by primary INR 2.2 11/02/20  6. Anxiety:  Seems stable  7. Edema: dependant from venous disease cut out salt in chicken tenders PRN Lasix    Current medicines are reviewed at length with the patient today.  The patient does not have concerns regarding medicines.  The following changes have been made:  no change  Labs/ tests ordered today include: Echo for murmur/MR   No orders of the defined types were placed in this encounter.    Disposition:   FU with me in a year      Signed, Jenkins Rouge, MD  12/06/2020 1:50 PM    [facility] [place], [place], [place]  [address] Phone: [phone]; Fax: [phone]

## 2020-12-07 ENCOUNTER — Other Ambulatory Visit: Payer: Self-pay

## 2020-12-07 ENCOUNTER — Ambulatory Visit: Payer: Medicare PPO

## 2020-12-07 DIAGNOSIS — Z7901 Long term (current) use of anticoagulants: Secondary | ICD-10-CM

## 2020-12-07 LAB — POCT INR: INR: 2.1 (ref 2.0–3.0)

## 2020-12-07 NOTE — Patient Instructions (Addendum)
Pre visit review using our clinic review tool, if applicable. No additional management support is needed unless otherwise documented below in the visit note.  Continue 1 tablet daily except take 1/2 tablet on Monday Wed and Fridays.  Re-check in  5 weeks.

## 2020-12-07 NOTE — Progress Notes (Signed)
Continue 1 tablet daily except take 1/2 tablet on Monday Wed and Fridays.  Re-check in  5 weeks.

## 2020-12-10 ENCOUNTER — Ambulatory Visit: Payer: Medicare PPO | Admitting: Cardiovascular Disease

## 2020-12-24 ENCOUNTER — Other Ambulatory Visit: Payer: Self-pay | Admitting: Family Medicine

## 2020-12-24 NOTE — Progress Notes (Signed)
Cardiology Office Note   Date:  12/31/2020   ID:  [name] Little, [name] Little 03-10-1933, MRN [id number]  PCP:  [name] Little, [name] Apa, DO  Cardiologist:   [name] Rouge, MD   Chief Complaint  Patient presents with   Follow-up       History of Present Illness:  87 y.o. first seen August 2018 for murmur and mitral valve disease.   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 CRF;s HTN, elevated lipids and DM.  Intolerant to statins with lipitor and pravachol causing weakness. Echo done 08/24/16 for edema reviewed  EF 55-60% mild LVH  Mild to moderate MR She has no history of chest pain palpitations PND/Orhtopnea rheumatic fever or SBE  No cardiac complaints Married 66 years has 4 children that look after her   Still drives and mows 2.5 acres at home in [facility] Understands that salt indiscretion leads to LE edema Does not take her lasix daily No signs of arrhythmia or CHF   No issues with heart. 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   anticoagulated on coumadin   GAD (generalized anxiety disorder)    Heart murmur    Hemorrhoids    History of avascular necrosis of capital femoral epiphysis 1999   s/p THA right   History of basal cell carcinoma (BCC) excision    scalp/ neck   History of Clostridium difficile infection    History of DVT of lower extremity 1998;  04/ 2006   LLE   History of external beam radiation therapy 2010   right breast cancer   History of ITP    per pt hx long-term use prednisone for ITP,  1998 s/p total splenctomy   History of kidney stones    History of melanoma 1980s   s/p  right calf  melanoma excision , per pt not malignant and localized ,  no recurrence   History of paroxysmal supraventricular tachycardia    (02-23-2020  pt stated have not had any issues / symptoms in several years   History of pulmonary embolus (PE) 04/2004   History of right breast cancer 2010   08-10-2008 s/p  right lumpectomy w/ sln bx (partial mastectomy) , ER+, Stage I,  completed radiation 2010,  per pt no recurrance   Hypertension    Macular degeneration, bilateral    mild   Mitral valve regurgitation 09/01/2016   cardiology-- dr Johnsie Cancel--- per note mild to moderate without stenosis,     Mixed hyperlipidemia 05/18/2015   Nocturia    OA (osteoarthritis)    lumbar stenosis, radiculopathy, OA- L hip   Personal history of colonic polyps    hyperplastic   Renal calculus, right    Right ureteral calculus    Thoracic ascending aortic aneurysm    last CT angio chest in epic 11-29-2016  , 3.7cm    Past Surgical History:  Procedure Laterality Date   ABDOMINAL HYSTERECTOMY  1973   BREAST LUMPECTOMY WITH RADIOACTIVE SEED AND SENTINEL LYMPH NODE BIOPSY Right 08/10/2008   '@[facility]'    CATARACT EXTRACTION W/  INTRAOCULAR LENS  IMPLANT, BILATERAL Bilateral 2001   CYSTOSCOPY WITH RETROGRADE PYELOGRAM, URETEROSCOPY AND STENT PLACEMENT Right 02/25/2020   Procedure: CYSTOSCOPY WITH RETROGRADE PYELOGRAM, URETEROSCOPY AND STENT EXCHANGE;  Surgeon: [name] Frock, MD;  Location: [facility];  Service: Urology;  Laterality: Right;  75 MINS   CYSTOSCOPY WITH STENT PLACEMENT Right 01/27/2020   Procedure: CYSTOSCOPY WITH STENT PLACEMENT;  Surgeon: [name] Frock, MD;  [facility];  Service: Urology;  Laterality: Right;   CYSTOSCOPY/URETEROSCOPY/HOLMIUM LASER/STENT PLACEMENT Left 06-27-2018  dr [name] Little,  '@[facility]'    HOLMIUM LASER APPLICATION Right 15/05/6977   Procedure: HOLMIUM LASER APPLICATION;  Surgeon: [name] Frock, MD;  Location: [facility];  Service: Urology;  Laterality: Right;    I & D EXTREMITY Right 10/26/2017   Procedure: IRRIGATION AND DEBRIDEMENT RIGHT HAND;  Surgeon: [name] Planas, MD;  Location: [place];  Service: Orthopedics;  Laterality: Right;   LUMBAR LAMINECTOMY/DECOMPRESSION MICRODISCECTOMY  04/11/2011   Procedure: LUMBAR LAMINECTOMY/DECOMPRESSION MICRODISCECTOMY;  Surgeon: [name] Miss, MD;  Location: [place] [facility];  Service: Neurosurgery;  Laterality: N/A;  Lumbar Five-Sacral One Microdiscectomy   MELANOMA EXCISION Right 1980s   right calf   SPLENECTOMY, TOTAL  1998   TOTAL HIP ARTHROPLASTY Right 1999   UPPER GASTROINTESTINAL ENDOSCOPY       Current Outpatient Medications  Medication Sig Dispense Refill   Accu-Chek Softclix Lancets lancets Use to check sugar daily or as needed. 100 each 1   B Complex Vitamins (VITAMIN B COMPLEX PO) Take 1 tablet by mouth daily.      blood glucose meter kit and supplies KIT Dispense based on patient and insurance preference. Use up to four times daily as directed. Dx: E11.9 1 each 0   Blood Pressure Monitoring (BLOOD PRESSURE KIT) DEVI 1 Device by Does not apply route daily as needed. Fill per patient insurance preference Dx: Hypertension 1 Device 0   Cranberry 1000 MG CAPS Take 1 capsule by mouth 2 (two) times daily.     fluconazole (DIFLUCAN) 150 MG tablet 1 po x1, may repeat in 3 days prn 2 tablet 0   furosemide (LASIX) 20 MG tablet TAKE 1 TABLET (20 MG TOTAL) BY MOUTH DAILY AS NEEDED FOR FLUID OR EDEMA. 30 tablet 2   glucose blood (ACCU-CHEK GUIDE) test strip USE TO TEST BLOOD SUGAR UP TO 4 TIMES DAILY AS INSTRUCTED 100 strip 5   hydrochlorothiazide (MICROZIDE) 12.5 MG capsule TAKE 1 CAPSULE (12.5 MG TOTAL) BY MOUTH AT BEDTIME. 90 capsule 1   nitrofurantoin (MACRODANTIN) 50 MG capsule Take 1 capsule (50 mg total) by mouth at bedtime. 30 capsule 2   potassium chloride SA (KLOR-CON M20) 20 MEQ tablet Take 1 tablet (20 mEq total) by mouth daily as needed. (Patient taking differently: Take 20 mEq by mouth daily as needed  (supplement).) 90 tablet 1   valsartan (DIOVAN) 160 MG tablet TAKE 1 TABLET BY MOUTH EVERY DAY 30 tablet 2   warfarin (COUMADIN) 5 MG tablet TAKE 1 TABLET DAILY EXCEPT TAKE 1/2 TABLET MON WED AND FRIDAYS OR TAKE AS DIRECTED BY ANTICOAGULATION CLINIC 90 tablet 1   No current facility-administered medications for this visit.    Allergies:   Amlodipine, Carvedilol, Chlorthalidone, Hydralazine, Statins, Zetia [ezetimibe], Cefuroxime axetil, and Sulfonamide derivatives    Social History:  The patient  reports that she has never smoked. She has never used smokeless tobacco. She reports that she does not drink alcohol and does not use drugs.   Family History:  The patient's family history  includes Alzheimer's disease in her sister; Breast cancer in her paternal aunt; Cancer in her paternal aunt; Clotting disorder in her maternal uncle; Dementia in her sister, sister, and sister; Heart disease in her father, mother, and another family member; Stroke in her sister.    ROS:  Please see the history of present illness.   Otherwise, review of systems are positive for none.   All other systems are reviewed and negative.    PHYSICAL EXAM: VS:  BP (!) 172/88   Pulse 68   Ht '5\' 3"'  (1.6 m)   Wt 170 lb (77.1 kg)   SpO2 98%   BMI 30.11 kg/m  , BMI Body mass index is 30.11 kg/m. Affect appropriate Healthy:  appears stated age 64: normal Neck supple with no adenopathy JVP normal no bruits no thyromegaly Lungs clear with no wheezing and good diaphragmatic motion Heart:  S1/S2 soft apical MR murmur, no rub, gallop or click PMI normal Abdomen: benighn, BS positve, no tenderness, no AAA no bruit.  No HSM or HJR Distal pulses intact with no bruits Plus 2 RLE edema Plus one LLE chronic stasis changes  Neuro non-focal Skin warm and dry No muscular weakness    EKG:  12/31/2020 NSR rate 65 normal just LAD    Recent Labs: 07/06/2020: Hemoglobin 13.4; Platelets 305.0; TSH 5.06 10/14/2020: ALT 12; BUN  29; Creatinine, Ser 0.89; Potassium 4.1; Sodium 142     Wt Readings from Last 3 Encounters:  12/31/20 170 lb (77.1 kg)  09/30/20 173 lb 12.8 oz (78.8 kg)  07/06/20 170 lb 12.8 oz (77.5 kg)      Other studies Reviewed: Additional studies/ records that were reviewed today include: Notes Dr [name] Echo done July 2018 ECG's .    ASSESSMENT AND PLAN:  1. MR- mild to moderate last echo 08/24/16 will update  2. HTN improved continue current meds  3. DM:  Discussed low carb diet.  Target hemoglobin A1c is 6.5 or less.  Continue current medications. 4. Cholesterol continue welchol  intolerant to statins No vascular dx LDL 95 5. Hypercoagulability: on coumadin followed by primary INR 2.2 11/02/20  6. Anxiety:  Seems stable  7. Edema: dependant from venous disease cut out salt in chicken tenders PRN Lasix    Current medicines are reviewed at length with the patient today.  The patient does not have concerns regarding medicines.  The following changes have been made:  no change  Labs/ tests ordered today include: Echo for murmur/MR   No orders of the defined types were placed in this encounter.    Disposition:   FU with me in a year      Signed, [name] Rouge, MD  12/31/2020 10:47 AM    [facility] [place], [place], [place]  [address] Phone: [phone]; Fax: [phone]

## 2020-12-31 ENCOUNTER — Ambulatory Visit: Payer: Medicare PPO | Admitting: Cardiovascular Disease

## 2020-12-31 ENCOUNTER — Encounter: Payer: Self-pay | Admitting: Cardiovascular Disease

## 2020-12-31 ENCOUNTER — Other Ambulatory Visit: Payer: Self-pay

## 2020-12-31 VITALS — BP 172/88 | HR 68 | Ht 63.0 in | Wt 170.0 lb

## 2020-12-31 DIAGNOSIS — I340 Nonrheumatic mitral (valve) insufficiency: Secondary | ICD-10-CM | POA: Diagnosis not present

## 2020-12-31 DIAGNOSIS — I10 Essential (primary) hypertension: Secondary | ICD-10-CM | POA: Diagnosis not present

## 2020-12-31 DIAGNOSIS — R609 Edema, unspecified: Secondary | ICD-10-CM

## 2020-12-31 NOTE — Patient Instructions (Signed)
Medication Instructions:  Your physician recommends that you continue on your current medications as directed. Please refer to the Current Medication list given to you today.  *If you need a refill on your cardiac medications before your next appointment, please call your pharmacy*   Lab Work: NONE If you have labs (blood work) drawn today and your tests are completely normal, you will receive your results only by: [place] (if you have MyChart) OR A paper copy in the mail If you have any lab test that is abnormal or we need to change your treatment, we will call you to review the results.   Testing/Procedures: Your physician has requested that you have an echocardiogram. Echocardiography is a painless test that uses sound waves to create images of your heart. It provides your doctor with information about the size and shape of your heart and how well your heart's chambers and valves are working. This procedure takes approximately one hour. There are no restrictions for this procedure.    Follow-Up: At [facility], you and your health needs are our priority.  As part of our continuing mission to provide you with exceptional heart care, we have created designated Provider Care Teams.  These Care Teams include your primary Cardiologist (physician) and Advanced Practice Providers (APPs -  Physician Assistants and Nurse Practitioners) who all work together to provide you with the care you need, when you need it.  We recommend signing up for the patient portal called "MyChart".  Sign up information is provided on this After Visit Summary.  MyChart is used to connect with patients for Virtual Visits (Telemedicine).  Patients are able to view lab/test results, encounter notes, upcoming appointments, etc.  Non-urgent messages can be sent to your provider as well.   To learn more about what you can do with MyChart, go to [email or web address].    Your next appointment:   1 year(s)  The  format for your next appointment:   In Person  Provider:   Jenkins Rouge, MD {    Other Instructions Echocardiogram An echocardiogram is a test that uses sound waves (ultrasound) to produce images of the heart. Images from an echocardiogram can provide important information about: Heart size and shape. The size and thickness and movement of your heart's walls. Heart muscle function and strength. Heart valve function or if you have stenosis. Stenosis is when the heart valves are too narrow. If blood is flowing backward through the heart valves (regurgitation). A tumor or infectious growth around the heart valves. Areas of heart muscle that are not working well because of poor blood flow or injury from a heart attack. Aneurysm detection. An aneurysm is a weak or damaged part of an artery wall. The wall bulges out from the normal force of blood pumping through the body. Tell a health care provider about: Any allergies you have. All medicines you are taking, including vitamins, herbs, eye drops, creams, and over-the-counter medicines. Any blood disorders you have. Any surgeries you have had. Any medical conditions you have. Whether you are pregnant or may be pregnant. What are the risks? Generally, this is a safe test. However, problems may occur, including an allergic reaction to dye (contrast) that may be used during the test. What happens before the test? No specific preparation is needed. You may eat and drink normally. What happens during the test?  You will take off your clothes from the waist up and put on a hospital gown. Electrodes or electrocardiogram (ECG)patches may be  placed on your chest. The electrodes or patches are then connected to a device that monitors your heart rate and rhythm. You will lie down on a table for an ultrasound exam. A gel will be applied to your chest to help sound waves pass through your skin. A handheld device, called a transducer, will be pressed  against your chest and moved over your heart. The transducer produces sound waves that travel to your heart and bounce back (or "echo" back) to the transducer. These sound waves will be captured in real-time and changed into images of your heart that can be viewed on a video monitor. The images will be recorded on a computer and reviewed by your health care provider. You may be asked to change positions or hold your breath for a short time. This makes it easier to get different views or better views of your heart. In some cases, you may receive contrast through an IV in one of your veins. This can improve the quality of the pictures from your heart. The procedure may vary among health care providers and hospitals. What can I expect after the test? You may return to your normal, everyday life, including diet, activities, and medicines, unless your health care provider tells you not to do that. Follow these instructions at home: It is up to you to get the results of your test. Ask your health care provider, or the department that is doing the test, when your results will be ready. Keep all follow-up visits. This is important. Summary An echocardiogram is a test that uses sound waves (ultrasound) to produce images of the heart. Images from an echocardiogram can provide important information about the size and shape of your heart, heart muscle function, heart valve function, and other possible heart problems. You do not need to do anything to prepare before this test. You may eat and drink normally. After the echocardiogram is completed, you may return to your normal, everyday life, unless your health care provider tells you not to do that. This information is not intended to replace advice given to you by your health care provider. Make sure you discuss any questions you have with your health care provider. Document Revised: 09/29/2020 Document Reviewed: 09/09/2019 Elsevier Patient Education  2022  [employer].

## 2021-01-06 ENCOUNTER — Encounter: Payer: Self-pay | Admitting: Family Medicine

## 2021-01-06 ENCOUNTER — Ambulatory Visit: Payer: Medicare PPO | Admitting: Family Medicine

## 2021-01-06 VITALS — BP 144/80 | HR 117 | Temp 97.6°F | Resp 18 | Ht 63.0 in | Wt 173.6 lb

## 2021-01-06 DIAGNOSIS — E876 Hypokalemia: Secondary | ICD-10-CM

## 2021-01-06 DIAGNOSIS — E785 Hyperlipidemia, unspecified: Secondary | ICD-10-CM | POA: Diagnosis not present

## 2021-01-06 DIAGNOSIS — E1169 Type 2 diabetes mellitus with other specified complication: Secondary | ICD-10-CM

## 2021-01-06 DIAGNOSIS — I10 Essential (primary) hypertension: Secondary | ICD-10-CM

## 2021-01-06 DIAGNOSIS — E669 Obesity, unspecified: Secondary | ICD-10-CM | POA: Diagnosis not present

## 2021-01-06 LAB — LIPID PANEL
Cholesterol: 223 mg/dL — ABNORMAL HIGH (ref 0–200)
HDL: 36.3 mg/dL — ABNORMAL LOW (ref >39.00)
NonHDL: 186.5
Total CHOL/HDL Ratio: 6
Triglycerides: 349 mg/dL — ABNORMAL HIGH (ref 0.0–149.0)
VLDL: 69.8 mg/dL — ABNORMAL HIGH (ref 0.0–40.0)

## 2021-01-06 LAB — COMPREHENSIVE METABOLIC PANEL
ALT: 11 U/L (ref 0–35)
AST: 18 U/L (ref 0–37)
Albumin: 4.1 g/dL (ref 3.5–5.2)
Alkaline Phosphatase: 55 U/L (ref 39–117)
BUN: 23 mg/dL (ref 6–23)
CO2: 30 mEq/L (ref 19–32)
Calcium: 9.7 mg/dL (ref 8.4–10.5)
Chloride: 105 mEq/L (ref 96–112)
Creatinine, Ser: 0.84 mg/dL (ref 0.40–1.20)
GFR: 63.52 mL/min (ref >60.00)
Glucose, Bld: 99 mg/dL (ref 70–99)
Potassium: 4.5 mEq/L (ref 3.5–5.1)
Sodium: 142 mEq/L (ref 135–145)
Total Bilirubin: 0.6 mg/dL (ref 0.2–1.2)
Total Protein: 7.2 g/dL (ref 6.0–8.3)

## 2021-01-06 LAB — HEMOGLOBIN A1C: Hgb A1c MFr Bld: 6.7 % — ABNORMAL HIGH (ref 4.6–6.5)

## 2021-01-06 LAB — LDL CHOLESTEROL, DIRECT: Direct LDL: 130 mg/dL

## 2021-01-06 MED ORDER — POTASSIUM CHLORIDE CRYS ER 20 MEQ PO TBCR: 20.0000 meq | EXTENDED_RELEASE_TABLET | Freq: Every day | ORAL | 1 refills | Status: DC

## 2021-01-06 MED ORDER — FUROSEMIDE 20 MG PO TABS: 20.0000 mg | ORAL_TABLET | Freq: Every day | ORAL | 3 refills | Status: DC | PRN

## 2021-01-06 NOTE — Assessment & Plan Note (Signed)
Encourage heart healthy diet such as MIND or DASH diet, increase exercise, avoid trans fats, simple carbohydrates and processed foods, consider a krill or fish or flaxseed oil cap daily.  Pt has not been able to tolerate statins

## 2021-01-06 NOTE — Assessment & Plan Note (Signed)
Well controlled, no changes to meds. Encouraged heart healthy diet such as the DASH diet and exercise as tolerated.  °

## 2021-01-06 NOTE — Patient Instructions (Signed)

## 2021-01-06 NOTE — Assessment & Plan Note (Signed)
hgba1c to be done, minimize simple carbs. Increase exercise as tolerated. Continue current meds  

## 2021-01-06 NOTE — Progress Notes (Signed)
Subjective:   By signing my name below, I, [name], attest that this documentation has been prepared under the direction and in the presence of Dr. Roma Schanz, DO. 01/06/2021      Patient ID: [name] Little, female    DOB: [birth year], 87 y.o.   MRN: [id number]  Chief Complaint  Patient presents with   Hypertension   Hyperlipidemia   Diabetes   Follow-up    Hypertension Pertinent negatives include no blurred vision, chest pain, headaches, malaise/fatigue, palpitations or shortness of breath.  Hyperlipidemia Pertinent negatives include no chest pain or shortness of breath.  Diabetes Pertinent negatives for hypoglycemia include no dizziness, headaches or nervousness/anxiousness. Pertinent negatives for diabetes include no blurred vision and no chest pain.  Patient is in today for a office visit.  She complains of losing hearing in her right ear. She is not interested in getting it further evaluated.  She recent seen her cardiologist and was told to take lasix PRN and 20 meq potassium chloride daily PO. She has ran out of 20 mg lasix and 20 meq potassium chloride supplements for the past week and is requesting a refill. She continues taking 12.5 mg hydrochlorothiazide daily PO and reports her legs are hurting.  BP Readings from Last 3 Encounters:  01/06/21 (!) 144/80  12/31/20 (!) 172/88  09/30/20 130/90   Pulse Readings from Last 3 Encounters:  01/06/21 (!) 117  12/31/20 68  09/30/20 80   She is not taking her cholesterol medication due to causing pain in her lower legs.   Lab Results  Component Value Date   CHOL 203 (H) 10/14/2020   HDL 37.30 (L) 10/14/2020   LDLCALC 143 (H) 12/19/2019   LDLDIRECT 95.0 10/14/2020   TRIG 392.0 (H) 10/14/2020   CHOLHDL 5 10/14/2020   She denies having any new issues urinating. She continues taking Macrobid to manage her urinary issues.  She continues seeing her ophthalmologist regularly.  She has 3 pfizer Covid-19  vaccines and is not interested in receiving the bivalent Covid-19 vaccines until February, 2023.  She is not interested in completing a bone density scan.    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   anticoagulated on coumadin   GAD (generalized anxiety disorder)    Heart murmur    Hemorrhoids    History of avascular necrosis of capital femoral epiphysis 1999   s/p THA right   History of basal cell carcinoma (BCC) excision    scalp/ neck   History of Clostridium difficile infection    History of DVT of lower extremity 1998;  04/ 2006   LLE   History of external beam radiation therapy 2010   right breast cancer   History of ITP    per pt hx long-term use prednisone for ITP,  1998 s/p total splenctomy   History of kidney stones    History of melanoma 1980s   s/p  right calf melanoma excision , per pt not malignant and localized ,  no recurrence   History of paroxysmal supraventricular tachycardia    (02-23-2020  pt stated have not had any issues / symptoms in several years   History of pulmonary embolus (PE) 04/2004   History of right breast cancer 2010   08-10-2008 s/p  right lumpectomy w/ sln bx (partial mastectomy) , ER+, Stage I,  completed radiation 2010,  per pt no recurrance   Hypertension    Macular degeneration, bilateral    mild   Mitral valve regurgitation 09/01/2016   cardiology-- dr Johnsie Cancel--- per note mild to moderate without stenosis,     Mixed hyperlipidemia 05/18/2015   Nocturia    OA (osteoarthritis)    lumbar stenosis, radiculopathy, OA- L hip   Personal history of colonic polyps    hyperplastic   Renal calculus, right    Right ureteral calculus    Thoracic ascending aortic aneurysm    last CT angio chest in epic 11-29-2016  , 3.7cm    Past Surgical History:   Procedure Laterality Date   ABDOMINAL HYSTERECTOMY  1973   BREAST LUMPECTOMY WITH RADIOACTIVE SEED AND SENTINEL LYMPH NODE BIOPSY Right 08/10/2008   '@[facility]'    CATARACT EXTRACTION W/ INTRAOCULAR LENS  IMPLANT, BILATERAL Bilateral 2001   CYSTOSCOPY WITH RETROGRADE PYELOGRAM, URETEROSCOPY AND STENT PLACEMENT Right 02/25/2020   Procedure: CYSTOSCOPY WITH RETROGRADE PYELOGRAM, URETEROSCOPY AND STENT EXCHANGE;  Surgeon: Alexis Frock, MD;  Location: [facility];  Service: Urology;  Laterality: Right;  75 MINS   CYSTOSCOPY WITH STENT PLACEMENT Right 01/27/2020   Procedure: CYSTOSCOPY WITH STENT PLACEMENT;  Surgeon: Alexis Frock, MD;  Location: [facility];  Service: Urology;  Laterality: Right;   CYSTOSCOPY/URETEROSCOPY/HOLMIUM LASER/STENT PLACEMENT Left 06-27-2018  dr Amalia Hailey,  '@[facility]'    HOLMIUM LASER APPLICATION Right 16/10/9602   Procedure: HOLMIUM LASER APPLICATION;  Surgeon: Alexis Frock, MD;  Location: [facility];  Service: Urology;  Laterality: Right;   I & D EXTREMITY Right 10/26/2017   Procedure: IRRIGATION AND DEBRIDEMENT RIGHT HAND;  Surgeon: Iran Planas, MD;  Location: [facility];  Service: Orthopedics;  Laterality: Right;   LUMBAR LAMINECTOMY/DECOMPRESSION MICRODISCECTOMY  04/11/2011   Procedure: LUMBAR LAMINECTOMY/DECOMPRESSION MICRODISCECTOMY;  Surgeon: [name], MD;  Location: [facility];  Service: Neurosurgery;  Laterality: N/A;  Lumbar Five-Sacral One Microdiscectomy   MELANOMA EXCISION Right 1980s   right calf   SPLENECTOMY, TOTAL  1998   TOTAL HIP ARTHROPLASTY Right 1999   UPPER GASTROINTESTINAL ENDOSCOPY      Family History  Problem Relation Age of Onset   Heart disease Mother    Heart disease Father    Alzheimer's disease Sister    Dementia Sister    Dementia Sister    Heart disease Other        uncles   Clotting disorder Maternal Uncle    Breast cancer Paternal 21    Cancer Paternal 54        breast   Stroke Sister    Dementia  Sister        mean, carotid artery disease   Colon cancer Neg Hx    Anesthesia problems Neg Hx    Hypotension Neg Hx    Malignant hyperthermia Neg Hx    Pseudochol deficiency Neg Hx     Social History   Socioeconomic History   Marital status: Married    Spouse name: Not on file   Number of children: 4   Years of education: Not on file   Highest education level: Not on file  Occupational History   Occupation: Retired  Tobacco Use   Smoking status: Never   Smokeless tobacco: Never  Vaping Use   Vaping Use: Never used  Substance and Sexual Activity   Alcohol use: No    Alcohol/week: 0.0 standard drinks   Drug  use: Never   Sexual activity: Not on file  Other Topics Concern   Not on file  Social History Narrative   Married 1955   2 sons - '59, '61, 2 daughters '55, '57; 8 grandchildren; 1 great grand   I-ADLs      Social Determinants of Radio broadcast assistant Strain: Not on file  Food Insecurity: Not on file  Transportation Needs: Not on file  Physical Activity: Not on file  Stress: Not on file  Social Connections: Not on file  Intimate Partner Violence: Not on file    Outpatient Medications Prior to Visit  Medication Sig Dispense Refill   Accu-Chek Softclix Lancets lancets Use to check sugar daily or as needed. 100 each 1   B Complex Vitamins (VITAMIN B COMPLEX PO) Take 1 tablet by mouth daily.      blood glucose meter kit and supplies KIT Dispense based on patient and insurance preference. Use up to four times daily as directed. Dx: E11.9 1 each 0   Blood Pressure Monitoring (BLOOD PRESSURE KIT) DEVI 1 Device by Does not apply route daily as needed. Fill per patient insurance preference Dx: Hypertension 1 Device 0   Cranberry 1000 MG CAPS Take 1 capsule by mouth 2 (two) times daily.     fluconazole (DIFLUCAN) 150 MG tablet 1 po x1, may repeat in 3 days prn 2 tablet 0   glucose blood (ACCU-CHEK GUIDE) test strip USE TO TEST BLOOD SUGAR UP TO 4 TIMES DAILY AS  INSTRUCTED 100 strip 5   nitrofurantoin (MACRODANTIN) 50 MG capsule Take 1 capsule (50 mg total) by mouth at bedtime. 30 capsule 2   valsartan (DIOVAN) 160 MG tablet TAKE 1 TABLET BY MOUTH EVERY DAY 30 tablet 2   warfarin (COUMADIN) 5 MG tablet TAKE 1 TABLET DAILY EXCEPT TAKE 1/2 TABLET MON WED AND FRIDAYS OR TAKE AS DIRECTED BY ANTICOAGULATION CLINIC 90 tablet 1   furosemide (LASIX) 20 MG tablet TAKE 1 TABLET (20 MG TOTAL) BY MOUTH DAILY AS NEEDED FOR FLUID OR EDEMA. 30 tablet 2   hydrochlorothiazide (MICROZIDE) 12.5 MG capsule TAKE 1 CAPSULE (12.5 MG TOTAL) BY MOUTH AT BEDTIME. 90 capsule 1   potassium chloride SA (KLOR-CON M20) 20 MEQ tablet Take 1 tablet (20 mEq total) by mouth daily as needed. (Patient taking differently: Take 20 mEq by mouth daily as needed (supplement).) 90 tablet 1   No facility-administered medications prior to visit.    Allergies  Allergen Reactions   Amlodipine Swelling    "my whole body swelled"   Carvedilol Swelling    "my whole body swelled"   Chlorthalidone     Patient reported extreme weakness.   Hydralazine Other (See Comments)    Joint and chest pain   Statins Other (See Comments)    Weakness, pain Lipitor and Pravachol Weakness, pain Lipitor and Pravachol   Zetia [Ezetimibe]     Unable to walk, severe leg pain   Cefuroxime Axetil     REACTION: Ddoes not remember reaction   Sulfonamide Derivatives Rash    REACTION: Rash    Review of Systems  Constitutional:  Negative for fever and malaise/fatigue.  HENT:  Negative for congestion.   Eyes:  Negative for blurred vision.  Respiratory:  Negative for shortness of breath.   Cardiovascular:  Negative for chest pain, palpitations and leg swelling.  Gastrointestinal:  Negative for abdominal pain, blood in stool and nausea.  Genitourinary:  Negative for dysuria and frequency.  Musculoskeletal:  Negative  for falls.       (+)bilateral lower leg pain  Skin:  Negative for rash.  Neurological:  Negative  for dizziness, loss of consciousness and headaches.  Endo/Heme/Allergies:  Negative for environmental allergies.  Psychiatric/Behavioral:  Negative for depression. The patient is not nervous/anxious.       Objective:    Physical Exam Vitals and nursing note reviewed.  Constitutional:      General: She is not in acute distress.    Appearance: Normal appearance. She is well-developed. She is not ill-appearing.  HENT:     Head: Normocephalic and atraumatic.     Right Ear: External ear normal.     Left Ear: External ear normal.  Eyes:     Extraocular Movements: Extraocular movements intact.     Conjunctiva/sclera: Conjunctivae normal.     Pupils: Pupils are equal, round, and reactive to light.  Neck:     Thyroid: No thyromegaly.     Vascular: No carotid bruit or JVD.  Cardiovascular:     Rate and Rhythm: Normal rate and regular rhythm.     Heart sounds: Normal heart sounds. No murmur heard.   No gallop.  Pulmonary:     Effort: Pulmonary effort is normal. No respiratory distress.     Breath sounds: Normal breath sounds. No wheezing or rales.  Chest:     Chest wall: No tenderness.  Musculoskeletal:     Cervical back: Normal range of motion and neck supple.     Right lower leg: No edema.     Left lower leg: No edema.  Skin:    General: Skin is warm and dry.  Neurological:     Mental Status: She is alert and oriented to person, place, and time.  Psychiatric:        Behavior: Behavior normal.        Judgment: Judgment normal.    BP (!) 144/80 (BP Location: Left Arm, Patient Position: Sitting, Cuff Size: Normal)   Pulse (!) 117   Temp 97.6 F (36.4 C) (Oral)   Resp 18   Ht '5\' 3"'  (1.6 m)   Wt 173 lb 9.6 oz (78.7 kg)   SpO2 95%   BMI 30.75 kg/m  Wt Readings from Last 3 Encounters:  01/06/21 173 lb 9.6 oz (78.7 kg)  12/31/20 170 lb (77.1 kg)  09/30/20 173 lb 12.8 oz (78.8 kg)    Diabetic Foot Exam - Simple   No data filed    Lab Results  Component Value Date    WBC 9.4 07/06/2020   HGB 13.4 07/06/2020   HCT 41.1 07/06/2020   PLT 305.0 07/06/2020   GLUCOSE 149 (H) 10/14/2020   CHOL 203 (H) 10/14/2020   TRIG 392.0 (H) 10/14/2020   HDL 37.30 (L) 10/14/2020   LDLDIRECT 95.0 10/14/2020   LDLCALC 143 (H) 12/19/2019   ALT 12 10/14/2020   AST 16 10/14/2020   NA 142 10/14/2020   K 4.1 10/14/2020   CL 103 10/14/2020   CREATININE 0.89 10/14/2020   BUN 29 (H) 10/14/2020   CO2 32 10/14/2020   TSH 5.06 (H) 07/06/2020   INR 2.1 12/07/2020   HGBA1C 6.9 (H) 10/14/2020   MICROALBUR 1.2 07/06/2020    Lab Results  Component Value Date   TSH 5.06 (H) 07/06/2020   Lab Results  Component Value Date   WBC 9.4 07/06/2020   HGB 13.4 07/06/2020   HCT 41.1 07/06/2020   MCV 91.6 07/06/2020   PLT 305.0 07/06/2020   Lab  Results  Component Value Date   NA 142 10/14/2020   K 4.1 10/14/2020   CHLORIDE 109 08/12/2013   CO2 32 10/14/2020   GLUCOSE 149 (H) 10/14/2020   BUN 29 (H) 10/14/2020   CREATININE 0.89 10/14/2020   BILITOT 0.5 10/14/2020   ALKPHOS 53 10/14/2020   AST 16 10/14/2020   ALT 12 10/14/2020   PROT 7.2 10/14/2020   ALBUMIN 4.0 10/14/2020   CALCIUM 9.8 10/14/2020   ANIONGAP 12 01/27/2020   GFR 59.36 (L) 10/14/2020   Lab Results  Component Value Date   CHOL 203 (H) 10/14/2020   Lab Results  Component Value Date   HDL 37.30 (L) 10/14/2020   Lab Results  Component Value Date   LDLCALC 143 (H) 12/19/2019   Lab Results  Component Value Date   TRIG 392.0 (H) 10/14/2020   Lab Results  Component Value Date   CHOLHDL 5 10/14/2020   Lab Results  Component Value Date   HGBA1C 6.9 (H) 10/14/2020       Assessment & Plan:   Problem List Items Addressed This Visit       Unprioritized   Diabetes mellitus type 2 in obese ([place])    hgba1c to be done, minimize simple carbs. Increase exercise as tolerated. Continue current meds       Relevant Orders   Comprehensive metabolic panel   Hemoglobin A1c   Essential hypertension     Well controlled, no changes to meds. Encouraged heart healthy diet such as the DASH diet and exercise as tolerated.       Relevant Medications   furosemide (LASIX) 20 MG tablet   Hyperlipidemia    Encourage heart healthy diet such as MIND or DASH diet, increase exercise, avoid trans fats, simple carbohydrates and processed foods, consider a krill or fish or flaxseed oil cap daily.  Pt has not been able to tolerate statins       Relevant Medications   furosemide (LASIX) 20 MG tablet   Other Visit Diagnoses     Primary hypertension    -  Primary   Relevant Medications   furosemide (LASIX) 20 MG tablet   Other Relevant Orders   Comprehensive metabolic panel   Hyperlipidemia associated with type 2 diabetes mellitus (HCC)       Relevant Medications   furosemide (LASIX) 20 MG tablet   Other Relevant Orders   Comprehensive metabolic panel   Lipid panel   Hypokalemia       Relevant Medications   potassium chloride SA (KLOR-CON M20) 20 MEQ tablet        Meds ordered this encounter  Medications   furosemide (LASIX) 20 MG tablet    Sig: Take 1 tablet (20 mg total) by mouth daily as needed for fluid or edema.    Dispense:  90 tablet    Refill:  3   potassium chloride SA (KLOR-CON M20) 20 MEQ tablet    Sig: Take 1 tablet (20 mEq total) by mouth daily.    Dispense:  90 tablet    Refill:  1    I, Dr. Roma Schanz, DO, personally preformed the services described in this documentation.  All medical record entries made by the scribe were at my direction and in my presence.  I have reviewed the chart and discharge instructions (if applicable) and agree that the record reflects my personal performance and is accurate and complete. 01/06/2021   I,[name],acting as a scribe for [employer], DO.,have documented all  relevant documentation on the behalf of Ann Held, DO,as directed by  Ann Held, DO while in the presence of Ann Held,  DO.   Ann Held, DO

## 2021-01-11 ENCOUNTER — Ambulatory Visit: Payer: Medicare PPO

## 2021-01-11 ENCOUNTER — Other Ambulatory Visit: Payer: Self-pay

## 2021-01-11 DIAGNOSIS — Z7901 Long term (current) use of anticoagulants: Secondary | ICD-10-CM | POA: Diagnosis not present

## 2021-01-11 LAB — POCT INR: INR: 2 (ref 2.0–3.0)

## 2021-01-11 NOTE — Progress Notes (Signed)
Continue 1 tablet daily except take 1/2 tablet on Monday Wed and Fridays.  Re-check in  6 weeks.

## 2021-01-11 NOTE — Patient Instructions (Addendum)
Pre visit review using our clinic review tool, if applicable. No additional management support is needed unless otherwise documented below in the visit note.  Continue 1 tablet daily except take 1/2 tablet on Monday Wed and Fridays.  Re-check in  6 weeks.

## 2021-01-11 NOTE — Progress Notes (Signed)
Medical treatment/procedure(s) were performed by non-physician practitioner and as supervising physician I was immediately available for consultation/collaboration. I agree with above. Rebel Laughridge A Levia Waltermire, MD  

## 2021-01-12 DIAGNOSIS — Z86006 Personal history of melanoma in-situ: Secondary | ICD-10-CM | POA: Diagnosis not present

## 2021-01-12 DIAGNOSIS — Z08 Encounter for follow-up examination after completed treatment for malignant neoplasm: Secondary | ICD-10-CM | POA: Diagnosis not present

## 2021-01-12 DIAGNOSIS — L820 Inflamed seborrheic keratosis: Secondary | ICD-10-CM | POA: Diagnosis not present

## 2021-01-12 DIAGNOSIS — L814 Other melanin hyperpigmentation: Secondary | ICD-10-CM | POA: Diagnosis not present

## 2021-01-12 DIAGNOSIS — D225 Melanocytic nevi of trunk: Secondary | ICD-10-CM | POA: Diagnosis not present

## 2021-01-12 DIAGNOSIS — L821 Other seborrheic keratosis: Secondary | ICD-10-CM | POA: Diagnosis not present

## 2021-01-12 DIAGNOSIS — L538 Other specified erythematous conditions: Secondary | ICD-10-CM | POA: Diagnosis not present

## 2021-01-12 DIAGNOSIS — C4359 Malignant melanoma of other part of trunk: Secondary | ICD-10-CM | POA: Diagnosis not present

## 2021-01-12 DIAGNOSIS — C44319 Basal cell carcinoma of skin of other parts of face: Secondary | ICD-10-CM | POA: Diagnosis not present

## 2021-01-12 DIAGNOSIS — C4442 Squamous cell carcinoma of skin of scalp and neck: Secondary | ICD-10-CM | POA: Diagnosis not present

## 2021-01-13 ENCOUNTER — Other Ambulatory Visit: Payer: Self-pay | Admitting: Family Medicine

## 2021-01-13 DIAGNOSIS — E1169 Type 2 diabetes mellitus with other specified complication: Secondary | ICD-10-CM

## 2021-02-03 ENCOUNTER — Other Ambulatory Visit: Payer: Self-pay

## 2021-02-03 ENCOUNTER — Encounter: Payer: Self-pay | Admitting: Family Medicine

## 2021-02-03 ENCOUNTER — Ambulatory Visit (HOSPITAL_COMMUNITY): Payer: Medicare PPO | Attending: Internal Medicine

## 2021-02-03 DIAGNOSIS — I340 Nonrheumatic mitral (valve) insufficiency: Secondary | ICD-10-CM | POA: Diagnosis not present

## 2021-02-03 LAB — ECHOCARDIOGRAM COMPLETE
Area-P 1/2: 4.77 cm2
S' Lateral: 2.1 cm

## 2021-02-03 NOTE — Telephone Encounter (Signed)
I see Dx of Hypokalemia. Correct? Please advise

## 2021-02-07 DIAGNOSIS — N3020 Other chronic cystitis without hematuria: Secondary | ICD-10-CM | POA: Diagnosis not present

## 2021-02-17 DIAGNOSIS — C44319 Basal cell carcinoma of skin of other parts of face: Secondary | ICD-10-CM | POA: Diagnosis not present

## 2021-02-17 DIAGNOSIS — L570 Actinic keratosis: Secondary | ICD-10-CM | POA: Diagnosis not present

## 2021-02-17 DIAGNOSIS — D485 Neoplasm of uncertain behavior of skin: Secondary | ICD-10-CM | POA: Diagnosis not present

## 2021-02-22 ENCOUNTER — Other Ambulatory Visit: Payer: Self-pay

## 2021-02-22 ENCOUNTER — Ambulatory Visit: Payer: Medicare PPO

## 2021-02-22 DIAGNOSIS — Z7901 Long term (current) use of anticoagulants: Secondary | ICD-10-CM

## 2021-02-22 LAB — POCT INR: INR: 3.4 — AB (ref 2.0–3.0)

## 2021-02-22 NOTE — Progress Notes (Signed)
Medical screening examination/treatment/procedure(s) were performed by non-physician practitioner and as supervising physician I was immediately available for consultation/collaboration. I agree with above. Tuwanna Krausz, MD   

## 2021-02-22 NOTE — Progress Notes (Addendum)
Pt has recently finished an abx and diflucan for a UTI. Pt could not remember what the name of the abx was. Pt also recently had skin cancer removed from her L temple. Coumadin was not stopped for the procedure.  Pt has been stable on her weekly dose for several months and was only out of range 4 months ago due to taking diflucan. Therefore, no changes in weekly dosing have been made Hold dose today and then continue 1 tablet daily except take 1/2 tablet on Monday Wed and Fridays.  Re-check in  3 weeks.

## 2021-02-22 NOTE — Patient Instructions (Addendum)
Pre visit review using our clinic review tool, if applicable. No additional management support is needed unless otherwise documented below in the visit note.  Hold dose today and then continue 1 tablet daily except take 1/2 tablet on Monday Wed and Fridays.  Re-check in  3 weeks.

## 2021-02-24 DIAGNOSIS — L570 Actinic keratosis: Secondary | ICD-10-CM | POA: Diagnosis not present

## 2021-03-15 ENCOUNTER — Ambulatory Visit: Payer: Medicare PPO

## 2021-03-15 ENCOUNTER — Other Ambulatory Visit: Payer: Self-pay

## 2021-03-15 DIAGNOSIS — Z7901 Long term (current) use of anticoagulants: Secondary | ICD-10-CM

## 2021-03-15 LAB — POCT INR: INR: 2.5 (ref 2.0–3.0)

## 2021-03-15 NOTE — Progress Notes (Signed)
Continue 1 tablet daily except take 1/2 tablet on Monday Wed and Fridays.  Re-check in  6 weeks.

## 2021-03-15 NOTE — Patient Instructions (Addendum)
Pre visit review using our clinic review tool, if applicable. No additional management support is needed unless otherwise documented below in the visit note.  Continue 1 tablet daily except take 1/2 tablet on Monday Wed and Fridays.  Re-check in  6 weeks.

## 2021-03-16 ENCOUNTER — Emergency Department (HOSPITAL_BASED_OUTPATIENT_CLINIC_OR_DEPARTMENT_OTHER): Payer: Medicare PPO

## 2021-03-16 ENCOUNTER — Other Ambulatory Visit: Payer: Self-pay

## 2021-03-16 ENCOUNTER — Emergency Department (HOSPITAL_BASED_OUTPATIENT_CLINIC_OR_DEPARTMENT_OTHER)
Admission: EM | Admit: 2021-03-16 | Discharge: 2021-03-16 | Disposition: A | Discharge status: Home / Self Care | Payer: Medicare PPO | Attending: Emergency Medicine | Admitting: Emergency Medicine

## 2021-03-16 ENCOUNTER — Encounter (HOSPITAL_BASED_OUTPATIENT_CLINIC_OR_DEPARTMENT_OTHER): Payer: Self-pay

## 2021-03-16 ENCOUNTER — Telehealth: Payer: Self-pay | Admitting: Family Medicine

## 2021-03-16 DIAGNOSIS — R1013 Epigastric pain: Secondary | ICD-10-CM | POA: Diagnosis not present

## 2021-03-16 DIAGNOSIS — I10 Essential (primary) hypertension: Secondary | ICD-10-CM | POA: Diagnosis not present

## 2021-03-16 DIAGNOSIS — C50919 Malignant neoplasm of unspecified site of unspecified female breast: Secondary | ICD-10-CM | POA: Diagnosis not present

## 2021-03-16 DIAGNOSIS — Z7901 Long term (current) use of anticoagulants: Secondary | ICD-10-CM | POA: Diagnosis not present

## 2021-03-16 DIAGNOSIS — Z79899 Other long term (current) drug therapy: Secondary | ICD-10-CM | POA: Insufficient documentation

## 2021-03-16 DIAGNOSIS — R110 Nausea: Secondary | ICD-10-CM | POA: Diagnosis not present

## 2021-03-16 DIAGNOSIS — R519 Headache, unspecified: Secondary | ICD-10-CM | POA: Diagnosis not present

## 2021-03-16 DIAGNOSIS — R600 Localized edema: Secondary | ICD-10-CM | POA: Insufficient documentation

## 2021-03-16 LAB — COMPREHENSIVE METABOLIC PANEL
ALT: 16 U/L (ref 0–44)
AST: 19 U/L (ref 15–41)
Albumin: 3.8 g/dL (ref 3.5–5.0)
Alkaline Phosphatase: 56 U/L (ref 38–126)
Anion gap: 10 (ref 5–15)
BUN: 19 mg/dL (ref 8–23)
CO2: 23 mmol/L (ref 22–32)
Calcium: 9 mg/dL (ref 8.9–10.3)
Chloride: 105 mmol/L (ref 98–111)
Creatinine, Ser: 0.67 mg/dL (ref 0.44–1.00)
GFR, Estimated: >60 mL/min (ref >60)
Glucose, Bld: 104 mg/dL — ABNORMAL HIGH (ref 70–99)
Potassium: 3.8 mmol/L (ref 3.5–5.1)
Sodium: 138 mmol/L (ref 135–145)
Total Bilirubin: 0.6 mg/dL (ref 0.3–1.2)
Total Protein: 7.2 g/dL (ref 6.5–8.1)

## 2021-03-16 LAB — PROTIME-INR
INR: 2 — ABNORMAL HIGH (ref 0.8–1.2)
Prothrombin Time: 22.8 seconds — ABNORMAL HIGH (ref 11.4–15.2)

## 2021-03-16 LAB — CBC
HCT: 42.2 % (ref 36.0–46.0)
Hemoglobin: 13.8 g/dL (ref 12.0–15.0)
MCH: 30.1 pg (ref 26.0–34.0)
MCHC: 32.7 g/dL (ref 30.0–36.0)
MCV: 91.9 fL (ref 80.0–100.0)
Platelets: 310 10*3/uL (ref 150–400)
RBC: 4.59 MIL/uL (ref 3.87–5.11)
RDW: 14.6 % (ref 11.5–15.5)
WBC: 8.8 10*3/uL (ref 4.0–10.5)
nRBC: 0 % (ref 0.0–0.2)

## 2021-03-16 LAB — LIPASE, BLOOD: Lipase: 54 U/L — ABNORMAL HIGH (ref 11–51)

## 2021-03-16 LAB — TROPONIN I (HIGH SENSITIVITY)
Troponin I (High Sensitivity): 9 ng/L (ref <18)
Troponin I (High Sensitivity): 11 ng/L (ref <18)

## 2021-03-16 LAB — BRAIN NATRIURETIC PEPTIDE: B Natriuretic Peptide: 109.2 pg/mL — ABNORMAL HIGH (ref 0.0–100.0)

## 2021-03-16 MED ORDER — CLONIDINE HCL 0.1 MG PO TABS
0.1000 mg | ORAL_TABLET | Freq: Once | ORAL | Status: AC
  Administered 2021-03-16: 0.1 mg via ORAL
  Filled 2021-03-16: qty 1

## 2021-03-16 MED ORDER — ACETAMINOPHEN 500 MG PO TABS
1000.0000 mg | ORAL_TABLET | Freq: Once | ORAL | Status: AC
  Administered 2021-03-16: 1000 mg via ORAL
  Filled 2021-03-16: qty 2

## 2021-03-16 NOTE — Telephone Encounter (Signed)
Pt in ED.  

## 2021-03-16 NOTE — ED Triage Notes (Signed)
Pt c/o elevated BP, swelling to bilat LE, HA, nausea-sx started 3-5 days ago-NAD-steady gait

## 2021-03-16 NOTE — Telephone Encounter (Signed)
FYI Pt in ED

## 2021-03-16 NOTE — Telephone Encounter (Signed)
Nurse Assessment Nurse: Kirk Ruths, RN, [facility] Date/Time (Eastern Time): 03/16/2021 10:33:19 AM Confirm and document reason for call. If symptomatic, describe symptoms. ---Caller states patient is having h/a, swelling of her bilateral feet/calves midway to knee, and nausea for three days, b/p is 199/97, is on b/p meds pitting edema no chest pain no shortness of breath Does the patient have any new or worsening symptoms? ---Yes Will a triage be completed? ---Yes Related visit to physician within the last 2 weeks? ---No Does the PT have any chronic conditions? (i.e. diabetes, asthma, this includes High risk factors for pregnancy, etc.) ---Yes List chronic conditions. ---htn, dm heart valve Is this a behavioral health or substance abuse call? ---No Guidelines Guideline Title Affirmed Question Affirmed Notes Nurse Date/Time (Eastern Time) Blood Pressure - High [5] Systolic BP >= 300 OR Diastolic >= 511 AND [0] cardiac or neurologic symptoms (e.g., chest pain, difficulty breathing, unsteady gait, blurred vision) Kirk Ruths, RN, [facility] 03/16/2021 10:37:10 AM PLEASE NOTE: All timestamps contained within this report are represented as [place] Standard Time. CONFIDENTIALTY NOTICE: This fax transmission is intended only for the addressee. It contains information that is legally privileged, confidential or otherwise protected from use or disclosure. If you are not the intended recipient, you are strictly prohibited from reviewing, disclosing, copying using or disseminating any of this information or taking any action in reliance on or regarding this information. If you have received this fax in error, please notify us immediately by telephone so that we can arrange for its return to [place]. Phone: [phone], Toll-Free: [phone], Fax: [phone] Page: 2 of 2 Call Id: [id number] [place]. Time Eilene Ghazi Time) Disposition Final User 03/16/2021 10:39:01 AM Go to ED Now Yes Kirk Ruths, RN, [facility] Caller  Disagree/Comply Comply Caller Understands Yes PreDisposition Go to ED Care Advice Given Per Guideline GO TO ED NOW: * You need to be seen in the Emergency Department. * Leave now. Drive carefully. NOTE TO TRIAGER - DRIVING: * Another adult should drive. * Patient should not delay going to the emergency department. CARE ADVICE given per High Blood Pressure (Adult) guideline. Referrals [facility] - ED

## 2021-03-16 NOTE — Telephone Encounter (Signed)
Pt's granddaughter called to go over some of her concerns. She stated pt has had a headache for the past three days with some stomach pain, and she was unsure if she just need to take her medications. Now her feet and legs seem to be swollen, granddaughter wanted to mention they stayed indented for a couple minutes when pressing on legs and feet. She asked her to take her bp while sitting down and it was 199/97. She stated she was not with pt at the moment but could go over there if she needed to. Granddaughter was triaged to go over pt's symptoms and seek advice on next steps. Please advise.

## 2021-03-16 NOTE — ED Provider Notes (Signed)
[facility] Provider Note   CSN: [id number] Arrival date & time: 03/16/21  1137     History  Chief Complaint  Patient presents with   Hypertension    Arthur Aydelotte is a 88 y.o. female.   Hypertension Associated symptoms include headaches.   88 year old female who presents to the emergency department with a chief complaint of headache in the setting of elevated blood pressure.  The patient states that she has had a headache for the past 3 days with some epigastric discomfort and nausea.  She has had bilateral swelling in her lower extremities.  She has been followed with cardiology for this and has previously been diagnosed with venous stasis in the lower extremities for which she intermittently takes Lasix.  She was hypertensive at home with a BP of 199/97.  She did endorse some nausea at this time.  She denies any chest pain or shortness of breath.  She currently denies any abdominal pain, or vomiting.  She denies any vision changes, neurologic deficits.  She denies any neck stiffness, fever or chills.  She denies any cough.  Home Medications Prior to Admission medications   Medication Sig Start Date End Date Taking? Authorizing Provider  Accu-Chek Softclix Lancets lancets Use to check sugar daily or as needed. 11/07/19   Mosie Lukes, MD  B Complex Vitamins (VITAMIN B COMPLEX PO) Take 1 tablet by mouth daily.     [provider]  blood glucose meter kit and supplies KIT Dispense based on patient and insurance preference. Use up to four times daily as directed. Dx: E11.9 07/06/20   Ann Held, DO  Blood Pressure Monitoring (BLOOD PRESSURE KIT) DEVI 1 Device by Does not apply route daily as needed. Fill per patient insurance preference Dx: Hypertension 01/29/18   Mosie Lukes, MD  Cranberry 1000 MG CAPS Take 1 capsule by mouth 2 (two) times daily.    [provider]  fluconazole (DIFLUCAN) 150 MG tablet 1 po x1, may  repeat in 3 days prn 09/30/20   Carollee Herter, Alferd Apa, DO  furosemide (LASIX) 20 MG tablet Take 1 tablet (20 mg total) by mouth daily as needed for fluid or edema. 01/06/21   Ann Held, DO  furosemide (LASIX) 40 MG tablet Take 1 tablet (40 mg total) by mouth daily. 03/17/21   Roma Schanz R, DO  glucose blood (ACCU-CHEK GUIDE) test strip USE TO TEST BLOOD SUGAR UP TO 4 TIMES DAILY AS INSTRUCTED 08/03/20   Carollee Herter, Alferd Apa, DO  nitrofurantoin (MACRODANTIN) 50 MG capsule Take 1 capsule (50 mg total) by mouth at bedtime. 03/30/20   Ann Held, DO  potassium chloride SA (KLOR-CON M20) 20 MEQ tablet Take 1 tablet (20 mEq total) by mouth daily. 01/06/21   Roma Schanz R, DO  valsartan (DIOVAN) 160 MG tablet TAKE 1 TABLET BY MOUTH EVERY DAY 01/13/21   Carollee Herter, Alferd Apa, DO  valsartan (DIOVAN) 320 MG tablet Take 1 tablet (320 mg total) by mouth daily. 03/17/21   Roma Schanz R, DO  warfarin (COUMADIN) 5 MG tablet TAKE 1 TABLET DAILY EXCEPT TAKE 1/2 TABLET MON WED AND FRIDAYS OR TAKE AS DIRECTED BY ANTICOAGULATION CLINIC 10/13/20   Ann Held, DO      Allergies    Amlodipine, Carvedilol, Chlorthalidone, Hydralazine, Statins, Zetia [ezetimibe], Cefuroxime axetil, and Sulfonamide derivatives    Review of Systems   Review of Systems  Gastrointestinal:  Positive for nausea.  Neurological:  Positive for headaches.  All other systems reviewed and are negative.  Physical Exam Updated Vital Signs BP (!) 177/69 (BP Location: Left Arm)    Pulse 63    Temp 98.1 F (36.7 C) (Oral)    Resp 20    Ht '5\' 3"'  (1.6 m)    Wt 78.9 kg    SpO2 95%    BMI 30.82 kg/m  Physical Exam Vitals and nursing note reviewed.  Constitutional:      General: She is not in acute distress.    Appearance: She is well-developed.  HENT:     Head: Normocephalic and atraumatic.  Eyes:     Conjunctiva/sclera: Conjunctivae normal.  Cardiovascular:     Rate and Rhythm: Normal rate and  regular rhythm.     Pulses: Normal pulses.     Heart sounds: No murmur heard. Pulmonary:     Effort: Pulmonary effort is normal. No respiratory distress.     Breath sounds: Normal breath sounds.  Abdominal:     Palpations: Abdomen is soft.     Tenderness: There is no abdominal tenderness.  Musculoskeletal:        General: No swelling.     Cervical back: Neck supple.  Skin:    General: Skin is warm and dry.     Capillary Refill: Capillary refill takes less than 2 seconds.  Neurological:     Mental Status: She is alert.     Comments: MENTAL STATUS EXAM:    Orientation: Alert and oriented to person, place and time.  Memory: Cooperative, follows commands well.  Language: Speech is clear and language is normal.   CRANIAL NERVES:    CN 2 (Optic): Visual fields intact to confrontation.  CN 3,4,6 (EOM): Pupils equal and reactive to light. Full extraocular eye movement without nystagmus.  CN 5 (Trigeminal): Facial sensation is normal, no weakness of masticatory muscles.  CN 7 (Facial): No facial weakness or asymmetry.  CN 8 (Auditory): Auditory acuity grossly normal.  CN 9,10 (Glossophar): The uvula is midline, the palate elevates symmetrically.  CN 11 (spinal access): Normal sternocleidomastoid and trapezius strength.  CN 12 (Hypoglossal): The tongue is midline. No atrophy or fasciculations.Marland Kitchen   MOTOR:  Muscle Strength: 5/5RUE, 5/5LUE, 5/5RLE, 5/5LLE.   COORDINATION:   Intact finger-to-nose, no tremor.   SENSATION:   Intact to light touch all four extremities.  GAIT: Gait normal without ataxia   Psychiatric:        Mood and Affect: Mood normal.    ED Results / Procedures / Treatments   Labs (all labs ordered are listed, but only abnormal results are displayed) Labs Reviewed  COMPREHENSIVE METABOLIC PANEL - Abnormal; Notable for the following components:      Result Value   Glucose, Bld 104 (*)    All other components within normal limits  BRAIN NATRIURETIC PEPTIDE -  Abnormal; Notable for the following components:   B Natriuretic Peptide 109.2 (*)    All other components within normal limits  PROTIME-INR - Abnormal; Notable for the following components:   Prothrombin Time 22.8 (*)    INR 2.0 (*)    All other components within normal limits  LIPASE, BLOOD - Abnormal; Notable for the following components:   Lipase 54 (*)    All other components within normal limits  CBC  TROPONIN I (HIGH SENSITIVITY)  TROPONIN I (HIGH SENSITIVITY)    EKG EKG Interpretation  Date/Time:  Wednesday March 16 2021 13:00:41  EST Ventricular Rate:  68 PR Interval:  181 QRS Duration: 84 QT Interval:  408 QTC Calculation: 434 R Axis:   -54 Text Interpretation: Sinus rhythm Left anterior fascicular block Consider left ventricular hypertrophy Anterior Q waves, possibly due to LVH Confirmed by Regan Lemming ([id number]) on 03/16/2021 2:07:17 PM  Radiology No results found.  Procedures Procedures    Medications Ordered in ED Medications  acetaminophen (TYLENOL) tablet 1,000 mg (1,000 mg Oral Given 03/16/21 1413)  cloNIDine (CATAPRES) tablet 0.1 mg (0.1 mg Oral Given 03/16/21 1430)    ED Course/ Medical Decision Making/ A&P                           Medical Decision Making Amount and/or Complexity of Data Reviewed Labs: ordered. Radiology: ordered.  Risk OTC drugs. Prescription drug management.    88 year old female who presents to the emergency department with a chief complaint of headache in the setting of elevated blood pressure.  The patient states that she has had a headache for the past 3 days with some epigastric discomfort and nausea.  She has had bilateral swelling in her lower extremities.  She has been followed with cardiology for this and has previously been diagnosed with venous stasis in the lower extremities for which she intermittently takes Lasix.  She was hypertensive at home with a BP of 199/97.  She did endorse some nausea at this time.  She  denies any chest pain or shortness of breath.  She currently denies any abdominal pain, or vomiting.  She denies any vision changes, neurologic deficits.  She denies any neck stiffness, fever or chills.  She denies any cough.  On arrival, the patient was afebrile, hemodynamically stable, hypertensive BP 200/108, saturating well on room air.  Her physical exam was significant for lungs that were clear to auscultation bilaterally.  She had symmetric pulses bilaterally with 2+ radial pulses.  She had no neurologic deficits identified on exam.  She presents with headache without vision changes, change in mental status, chest pain or shortness of breath, abdominal pain in the setting of hypertension.  She has been taking her home antihypertensives.  She does have 1+ pitting edema bilaterally on exam but this is chronic for her.  On review of her cardiology notes outpatient, she has been diagnosed with chronic venous stasis and intermittently takes Lasix for this, no diagnosis of heart failure.  EKG on arrival significant for sinus rhythm, ventricular rate 68, Q waves in the anterior leads, possibly due to LVH, no ST segment changes or T wave inversions.  Considered headache in the setting of hypertension.  Additionally considered SDH.  The patient's headache was not sudden onset or maximal in onset.  She describes a gradual onset headache.  Suspect likely headache in the setting of the patient's hypertension.  Additional concern for possible ACS given nausea and hypertension although the patient has no chest pain.  Will rule out ACS with delta troponins.  The patient's lower extremity edema is chronic although has increased over the past few days.  Considered new onset CHF.  Chest x-ray was performed which was reviewed by myself and radiology and revealed no acute cardiac or pulmonary abnormality.  No evidence of pulmonary edema.  No evidence of pneumonia.  The patient has no chest pain or shortness of breath to  suggest PE.  A CT head was performed which revealed no acute intracranial abnormality.  I have very low suspicion for subarachnoid  hemorrhage at this time as the etiology the patient's headache.  The patient was administered clonidine for elevated blood pressure and Tylenol for her headache.  She had gradual improvement in her blood pressure and complete resolution of her headache symptoms.  She is neurologically intact with no visual field deficits.  I have low concern for hypertensive encephalopathy as the patient is at her baseline mental status.  I have low concern for CVA as the patient is neurologically intact.  Low concern for ACS as the patient has reassuring EKG findings and negative delta troponins.  A BNP was only very mildly and nonspecifically elevated to 109.  No evidence for new onset heart failure.  CMP and CBC were unremarkable.  Overall reassuring work-up, significant for headache and hypertension.  I advised the patient follow-up outpatient with her PCP to discuss further antihypertensive management.  I also advised that the patient continue to take her as needed Lasix daily for the next few days.   The patient has been appropriately medically screened and/or stabilized in the ED. I have low suspicion for any other emergent medical condition which would require further screening, evaluation or treatment in the ED or require inpatient management.    Final Clinical Impression(s) / ED Diagnoses Final diagnoses:  Hypertension, unspecified type  Acute nonintractable headache, unspecified headache type  Bilateral leg edema    Rx / DC Orders ED Discharge Orders     None         Regan Lemming, MD 03/18/21 1443

## 2021-03-16 NOTE — ED Notes (Signed)
Pt in bed, pt offers no complaints at this time, family at bedside.

## 2021-03-17 ENCOUNTER — Encounter: Payer: Self-pay | Admitting: Family Medicine

## 2021-03-17 ENCOUNTER — Ambulatory Visit: Payer: Medicare PPO | Admitting: Family Medicine

## 2021-03-17 VITALS — BP 180/120 | HR 113 | Temp 98.0°F | Resp 24 | Ht 63.0 in | Wt 170.8 lb

## 2021-03-17 DIAGNOSIS — I10 Essential (primary) hypertension: Secondary | ICD-10-CM | POA: Diagnosis not present

## 2021-03-17 DIAGNOSIS — R600 Localized edema: Secondary | ICD-10-CM | POA: Diagnosis not present

## 2021-03-17 LAB — BRAIN NATRIURETIC PEPTIDE: Pro B Natriuretic peptide (BNP): 111 pg/mL — ABNORMAL HIGH (ref 0.0–100.0)

## 2021-03-17 MED ORDER — FUROSEMIDE 40 MG PO TABS: 40.0000 mg | ORAL_TABLET | Freq: Every day | ORAL | 3 refills | Status: DC

## 2021-03-17 MED ORDER — VALSARTAN 320 MG PO TABS: 320.0000 mg | ORAL_TABLET | Freq: Every day | ORAL | 3 refills | Status: DC

## 2021-03-17 NOTE — Assessment & Plan Note (Signed)
Poorly controlled will alter medications, encouraged DASH diet, minimize caffeine and obtain adequate sleep. Report concerning symptoms and follow up as directed and as needed 

## 2021-03-17 NOTE — Assessment & Plan Note (Signed)
Inc lasix to 40 mg daily  Elevate legs  Compression socks

## 2021-03-17 NOTE — Patient Instructions (Addendum)

## 2021-03-17 NOTE — Progress Notes (Signed)
Subjective:   By signing my name below, I, [name], attest that this documentation has been prepared under the direction and in the presence of Ann Held, DO  03/17/2021    Patient ID: [name] Little, female    DOB: Oct 17, 1933, 88 y.o.   MRN: [id number]  Chief Complaint  Patient presents with   ER follow up    HTN, headache, and dizziness    HPI Patient is in today for a ED follow up. She is present with her daughter during this visit.  She was admitted to the ED earlier today for unspecified hypertension.  Her blood pressure was elevated for the past 5 days. She reports taking 20 mg lasix this morning. She continues having swelling in both legs. She completed a CAT scan and X-ray while in the ED and reports the results were normal. She continues having headaches since being in the ED and was given tylenol to manage it. She continues having headaches at this time.  She denies having and SOB at this time. She has an upcomming appointment with her cardiologist next year but is trying to make an appointment sooner. She continues taking 160 mg valsartan daily PO, 20 mg lasix daily PO, 5 mg warfarin and reports no new issues while taking it.  BP Readings from Last 3 Encounters:  03/17/21 (!) 180/120  03/16/21 (!) 177/69  01/06/21 (!) 144/80   Pulse Readings from Last 3 Encounters:  03/17/21 (!) 113  03/16/21 63  01/06/21 (!) 117   Her daughter reports she is losing hearing in both ears. She is interested in testing her hearing.    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   anticoagulated on coumadin   GAD (generalized anxiety disorder)    Heart murmur    Hemorrhoids    History of avascular necrosis of capital femoral epiphysis 1999   s/p THA right   History of  basal cell carcinoma (BCC) excision    scalp/ neck   History of Clostridium difficile infection    History of DVT of lower extremity 1998;  04/ 2006   LLE   History of external beam radiation therapy 2010   right breast cancer   History of ITP    per pt hx long-term use prednisone for ITP,  1998 s/p total splenctomy   History of kidney stones    History of melanoma 1980s   s/p  right calf melanoma excision , per pt not malignant and localized ,  no recurrence   History of paroxysmal supraventricular tachycardia    (02-23-2020  pt stated have not had any issues / symptoms in several years   History of pulmonary embolus (PE) 04/2004   History of right breast cancer 2010   08-10-2008 s/p  right lumpectomy w/ sln bx (partial mastectomy) , ER+, Stage I,  completed radiation 2010,  per pt no recurrance   Hypertension    Macular degeneration, bilateral    mild   Mitral valve regurgitation 09/01/2016   cardiology-- dr Johnsie Cancel--- per note mild to moderate without stenosis,     Mixed hyperlipidemia 05/18/2015   Nocturia    OA (osteoarthritis)    lumbar stenosis, radiculopathy, OA- L hip   Personal history of colonic polyps  hyperplastic   Renal calculus, right    Right ureteral calculus    Thoracic ascending aortic aneurysm    last CT angio chest in epic 11-29-2016  , 3.7cm    Past Surgical History:  Procedure Laterality Date   ABDOMINAL HYSTERECTOMY  1973   BREAST LUMPECTOMY WITH RADIOACTIVE SEED AND SENTINEL LYMPH NODE BIOPSY Right 08/10/2008   '@[facility]'    CATARACT EXTRACTION W/ INTRAOCULAR LENS  IMPLANT, BILATERAL Bilateral 2001   CYSTOSCOPY WITH RETROGRADE PYELOGRAM, URETEROSCOPY AND STENT PLACEMENT Right 02/25/2020   Procedure: CYSTOSCOPY WITH RETROGRADE PYELOGRAM, URETEROSCOPY AND STENT EXCHANGE;  Surgeon: Alexis Frock, MD;  Location: [facility];  Service: Urology;  Laterality: Right;  75 MINS   CYSTOSCOPY WITH STENT PLACEMENT Right 01/27/2020   Procedure:  CYSTOSCOPY WITH STENT PLACEMENT;  Surgeon: Alexis Frock, MD;  Location: [facility];  Service: Urology;  Laterality: Right;   CYSTOSCOPY/URETEROSCOPY/HOLMIUM LASER/STENT PLACEMENT Left 06-27-2018  dr Amalia Hailey,  '@[facility]'    HOLMIUM LASER APPLICATION Right 25/05/3974   Procedure: HOLMIUM LASER APPLICATION;  Surgeon: Alexis Frock, MD;  Location: [facility];  Service: Urology;  Laterality: Right;   I & D EXTREMITY Right 10/26/2017   Procedure: IRRIGATION AND DEBRIDEMENT RIGHT HAND;  Surgeon: Iran Planas, MD;  Location: [place];  Service: Orthopedics;  Laterality: Right;   LUMBAR LAMINECTOMY/DECOMPRESSION MICRODISCECTOMY  04/11/2011   Procedure: LUMBAR LAMINECTOMY/DECOMPRESSION MICRODISCECTOMY;  Surgeon: [name], MD;  Location: [facility] [facility];  Service: Neurosurgery;  Laterality: N/A;  Lumbar Five-Sacral One Microdiscectomy   MELANOMA EXCISION Right 1980s   right calf   SPLENECTOMY, TOTAL  1998   TOTAL HIP ARTHROPLASTY Right 1999   UPPER GASTROINTESTINAL ENDOSCOPY      Family History  Problem Relation Age of Onset   Heart disease Mother    Heart disease Father    Alzheimer's disease Sister    Dementia Sister    Dementia Sister    Heart disease Other        uncles   Clotting disorder Maternal Uncle    Breast cancer Paternal 99    Cancer Paternal 65        breast   Stroke Sister    Dementia Sister        mean, carotid artery disease   Colon cancer Neg Hx    Anesthesia problems Neg Hx    Hypotension Neg Hx    Malignant hyperthermia Neg Hx    Pseudochol deficiency Neg Hx     Social History   Socioeconomic History   Marital status: Married    Spouse name: Not on file   Number of children: 4   Years of education: Not on file   Highest education level: Not on file  Occupational History   Occupation: Retired  Tobacco Use   Smoking status: Never   Smokeless tobacco: Never  Vaping Use   Vaping Use: Never used  Substance and Sexual Activity   Alcohol use: No     Alcohol/week: 0.0 standard drinks   Drug use: Never   Sexual activity: Not on file  Other Topics Concern   Not on file  Social History Narrative   Married 1955   2 sons - '59, '61, 2 daughters '55, '57; 8 grandchildren; 1 great grand   I-ADLs      Social Determinants of Radio broadcast assistant Strain: Not on file  Food Insecurity: Not on file  Transportation Needs: Not on file  Physical Activity: Not on file  Stress: Not on file  Social Connections: Not on file  Intimate Partner Violence: Not on file    Outpatient Medications Prior to Visit  Medication Sig Dispense Refill   Accu-Chek Softclix Lancets lancets Use to check sugar daily or as needed. 100 each 1   B Complex Vitamins (VITAMIN B COMPLEX PO) Take 1 tablet by mouth daily.      blood glucose meter kit and supplies KIT Dispense based on patient and insurance preference. Use up to four times daily as directed. Dx: E11.9 1 each 0   Blood Pressure Monitoring (BLOOD PRESSURE KIT) DEVI 1 Device by Does not apply route daily as needed. Fill per patient insurance preference Dx: Hypertension 1 Device 0   Cranberry 1000 MG CAPS Take 1 capsule by mouth 2 (two) times daily.     fluconazole (DIFLUCAN) 150 MG tablet 1 po x1, may repeat in 3 days prn 2 tablet 0   furosemide (LASIX) 20 MG tablet Take 1 tablet (20 mg total) by mouth daily as needed for fluid or edema. 90 tablet 3   glucose blood (ACCU-CHEK GUIDE) test strip USE TO TEST BLOOD SUGAR UP TO 4 TIMES DAILY AS INSTRUCTED 100 strip 5   nitrofurantoin (MACRODANTIN) 50 MG capsule Take 1 capsule (50 mg total) by mouth at bedtime. 30 capsule 2   potassium chloride SA (KLOR-CON M20) 20 MEQ tablet Take 1 tablet (20 mEq total) by mouth daily. 90 tablet 1   valsartan (DIOVAN) 160 MG tablet TAKE 1 TABLET BY MOUTH EVERY DAY 30 tablet 2   warfarin (COUMADIN) 5 MG tablet TAKE 1 TABLET DAILY EXCEPT TAKE 1/2 TABLET MON WED AND FRIDAYS OR TAKE AS DIRECTED BY ANTICOAGULATION CLINIC 90 tablet 1    No facility-administered medications prior to visit.    Allergies  Allergen Reactions   Amlodipine Swelling    "my whole body swelled"   Carvedilol Swelling    "my whole body swelled"   Chlorthalidone     Patient reported extreme weakness.   Hydralazine Other (See Comments)    Joint and chest pain   Statins Other (See Comments)    Weakness, pain Lipitor and Pravachol Weakness, pain Lipitor and Pravachol   Zetia [Ezetimibe]     Unable to walk, severe leg pain   Cefuroxime Axetil     REACTION: Ddoes not remember reaction   Sulfonamide Derivatives Rash    REACTION: Rash    Review of Systems  Constitutional:  Negative for fever and malaise/fatigue.  HENT:  Positive for hearing loss (bilateral ears). Negative for congestion.   Eyes:  Negative for blurred vision.  Respiratory:  Negative for cough and shortness of breath.   Cardiovascular:  Positive for leg swelling (bilateral lower extremities). Negative for chest pain and palpitations.  Gastrointestinal:  Negative for vomiting.  Musculoskeletal:  Negative for back pain.  Skin:  Negative for rash.  Neurological:  Positive for headaches. Negative for loss of consciousness.      Objective:    Physical Exam Constitutional:      General: She is not in acute distress.    Appearance: Normal appearance. She is not ill-appearing.  HENT:     Head: Normocephalic and atraumatic.     Right Ear: External ear normal.     Left Ear: External ear normal.  Eyes:     Extraocular Movements: Extraocular movements intact.     Pupils: Pupils are equal, round, and reactive to light.  Cardiovascular:     Rate and Rhythm: Normal rate and regular rhythm.  Heart sounds: Normal heart sounds. No murmur heard.   No gallop.  Pulmonary:     Effort: Pulmonary effort is normal. No respiratory distress.     Breath sounds: Normal breath sounds. No wheezing or rales.  Musculoskeletal:     Right lower leg: 1+ Pitting Edema present.     Left  lower leg: 1+ Pitting Edema present.  Skin:    General: Skin is warm and dry.  Neurological:     Mental Status: She is alert and oriented to person, place, and time.  Psychiatric:        Behavior: Behavior normal.        Judgment: Judgment normal.    BP (!) 180/120 (BP Location: Right Arm, Patient Position: Sitting, Cuff Size: Large)    Pulse (!) 113    Temp 98 F (36.7 C) (Oral)    Resp (!) 24    Ht '5\' 3"'  (1.6 m)    Wt 170 lb 12.8 oz (77.5 kg)    SpO2 95%    BMI 30.26 kg/m  Wt Readings from Last 3 Encounters:  03/17/21 170 lb 12.8 oz (77.5 kg)  03/16/21 174 lb (78.9 kg)  01/06/21 173 lb 9.6 oz (78.7 kg)    Diabetic Foot Exam - Simple   No data filed    Lab Results  Component Value Date   WBC 8.8 03/16/2021   HGB 13.8 03/16/2021   HCT 42.2 03/16/2021   PLT 310 03/16/2021   GLUCOSE 104 (H) 03/16/2021   CHOL 223 (H) 01/06/2021   TRIG 349.0 (H) 01/06/2021   HDL 36.30 (L) 01/06/2021   LDLDIRECT 130.0 01/06/2021   LDLCALC 143 (H) 12/19/2019   ALT 16 03/16/2021   AST 19 03/16/2021   NA 138 03/16/2021   K 3.8 03/16/2021   CL 105 03/16/2021   CREATININE 0.67 03/16/2021   BUN 19 03/16/2021   CO2 23 03/16/2021   TSH 5.06 (H) 07/06/2020   INR 2.0 (H) 03/16/2021   HGBA1C 6.7 (H) 01/06/2021   MICROALBUR 1.2 07/06/2020    Lab Results  Component Value Date   TSH 5.06 (H) 07/06/2020   Lab Results  Component Value Date   WBC 8.8 03/16/2021   HGB 13.8 03/16/2021   HCT 42.2 03/16/2021   MCV 91.9 03/16/2021   PLT 310 03/16/2021   Lab Results  Component Value Date   NA 138 03/16/2021   K 3.8 03/16/2021   CHLORIDE 109 08/12/2013   CO2 23 03/16/2021   GLUCOSE 104 (H) 03/16/2021   BUN 19 03/16/2021   CREATININE 0.67 03/16/2021   BILITOT 0.6 03/16/2021   ALKPHOS 56 03/16/2021   AST 19 03/16/2021   ALT 16 03/16/2021   PROT 7.2 03/16/2021   ALBUMIN 3.8 03/16/2021   CALCIUM 9.0 03/16/2021   ANIONGAP 10 03/16/2021   GFR 63.52 01/06/2021   Lab Results  Component  Value Date   CHOL 223 (H) 01/06/2021   Lab Results  Component Value Date   HDL 36.30 (L) 01/06/2021   Lab Results  Component Value Date   LDLCALC 143 (H) 12/19/2019   Lab Results  Component Value Date   TRIG 349.0 (H) 01/06/2021   Lab Results  Component Value Date   CHOLHDL 6 01/06/2021   Lab Results  Component Value Date   HGBA1C 6.7 (H) 01/06/2021       Assessment & Plan:   Problem List Items Addressed This Visit       Unprioritized   Essential hypertension  Poorly controlled will alter medications, encouraged DASH diet, minimize caffeine and obtain adequate sleep. Report concerning symptoms and follow up as directed and as needed      Relevant Medications   valsartan (DIOVAN) 320 MG tablet   furosemide (LASIX) 40 MG tablet   Lower extremity edema    Inc lasix to 40 mg daily  Elevate legs  Compression socks       Relevant Orders   B Nat Peptide   Other Visit Diagnoses     Primary hypertension    -  Primary   Relevant Medications   valsartan (DIOVAN) 320 MG tablet   furosemide (LASIX) 40 MG tablet   Other Relevant Orders   B Nat Peptide        Meds ordered this encounter  Medications   valsartan (DIOVAN) 320 MG tablet    Sig: Take 1 tablet (320 mg total) by mouth daily.    Dispense:  90 tablet    Refill:  3   furosemide (LASIX) 40 MG tablet    Sig: Take 1 tablet (40 mg total) by mouth daily.    Dispense:  30 tablet    Refill:  3    I, Ann Held, DO, personally preformed the services described in this documentation.  All medical record entries made by the scribe were at my direction and in my presence.  I have reviewed the chart and discharge instructions (if applicable) and agree that the record reflects my personal performance and is accurate and complete. 03/17/2021   I,[name],acting as a scribe for Ann Held, DO.,have documented all relevant documentation on the behalf of Ann Held, DO,as  directed by  Ann Held, DO while in the presence of Ann Held, DO.   Ann Held, DO

## 2021-03-21 ENCOUNTER — Encounter: Payer: Self-pay | Admitting: Cardiovascular Disease

## 2021-03-21 DIAGNOSIS — Z86006 Personal history of melanoma in-situ: Secondary | ICD-10-CM | POA: Diagnosis not present

## 2021-03-21 DIAGNOSIS — L570 Actinic keratosis: Secondary | ICD-10-CM | POA: Diagnosis not present

## 2021-03-21 DIAGNOSIS — Z85828 Personal history of other malignant neoplasm of skin: Secondary | ICD-10-CM | POA: Diagnosis not present

## 2021-03-21 DIAGNOSIS — L905 Scar conditions and fibrosis of skin: Secondary | ICD-10-CM | POA: Diagnosis not present

## 2021-04-05 ENCOUNTER — Ambulatory Visit: Payer: Medicare PPO | Admitting: Family Medicine

## 2021-04-05 ENCOUNTER — Encounter: Payer: Self-pay | Admitting: Family Medicine

## 2021-04-05 VITALS — BP 140/80 | HR 79 | Temp 97.8°F | Resp 20 | Ht 63.0 in | Wt 166.8 lb

## 2021-04-05 DIAGNOSIS — R8290 Unspecified abnormal findings in urine: Secondary | ICD-10-CM

## 2021-04-05 DIAGNOSIS — N390 Urinary tract infection, site not specified: Secondary | ICD-10-CM | POA: Diagnosis not present

## 2021-04-05 DIAGNOSIS — R1030 Lower abdominal pain, unspecified: Secondary | ICD-10-CM | POA: Diagnosis not present

## 2021-04-05 DIAGNOSIS — I10 Essential (primary) hypertension: Secondary | ICD-10-CM | POA: Diagnosis not present

## 2021-04-05 LAB — POC URINALSYSI DIPSTICK (AUTOMATED)
Bilirubin, UA: NEGATIVE
Blood, UA: NEGATIVE
Glucose, UA: NEGATIVE
Ketones, UA: NEGATIVE
Leukocytes, UA: NEGATIVE
Nitrite, UA: POSITIVE
Protein, UA: NEGATIVE
Spec Grav, UA: 1.015 (ref 1.010–1.025)
Urobilinogen, UA: 0.2 E.U./dL
pH, UA: 6 (ref 5.0–8.0)

## 2021-04-05 MED ORDER — NITROFURANTOIN MONOHYD MACRO 100 MG PO CAPS: 100.0000 mg | ORAL_CAPSULE | Freq: Two times a day (BID) | ORAL | 0 refills | Status: DC

## 2021-04-05 MED ORDER — FLUCONAZOLE 150 MG PO TABS: ORAL_TABLET | ORAL | 0 refills | Status: DC

## 2021-04-05 NOTE — Progress Notes (Signed)
Established Patient Office Visit  Subjective:  Patient ID: [name] Little, female    DOB: 10-Jul-1933  Age: 88 y.o. MRN: [id number]  CC:  Chief Complaint  Patient presents with   Hypertension    Pt states checking blood pressures at home and brings list of blood pressures.   Follow-up    HPI [name] Little presents for bp f/u and she c/o suprapubic tenderness and urgency x 4 days , no fever .  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   anticoagulated on coumadin   GAD (generalized anxiety disorder)    Heart murmur    Hemorrhoids    History of avascular necrosis of capital femoral epiphysis 1999   s/p THA right   History of basal cell carcinoma (BCC) excision    scalp/ neck   History of Clostridium difficile infection    History of DVT of lower extremity 1998;  04/ 2006   LLE   History of external beam radiation therapy 2010   right breast cancer   History of ITP    per pt hx long-term use prednisone for ITP,  1998 s/p total splenctomy   History of kidney stones    History of melanoma 1980s   s/p  right calf melanoma excision , per pt not malignant and localized ,  no recurrence   History of paroxysmal supraventricular tachycardia    (02-23-2020  pt stated have not had any issues / symptoms in several years   History of pulmonary embolus (PE) 04/2004   History of right breast cancer 2010   08-10-2008 s/p  right lumpectomy w/ sln bx (partial mastectomy) , ER+, Stage I,  completed radiation 2010,  per pt no recurrance   Hypertension    Macular degeneration, bilateral    mild   Mitral valve regurgitation 09/01/2016   cardiology-- dr Johnsie Cancel--- per note mild to moderate without stenosis,     Mixed hyperlipidemia 05/18/2015   Nocturia    OA  (osteoarthritis)    lumbar stenosis, radiculopathy, OA- L hip   Personal history of colonic polyps    hyperplastic   Renal calculus, right    Right ureteral calculus    Thoracic ascending aortic aneurysm    last CT angio chest in epic 11-29-2016  , 3.7cm    Past Surgical History:  Procedure Laterality Date   ABDOMINAL HYSTERECTOMY  1973   BREAST LUMPECTOMY WITH RADIOACTIVE SEED AND SENTINEL LYMPH NODE BIOPSY Right 08/10/2008   '@[facility]'    CATARACT EXTRACTION W/ INTRAOCULAR LENS  IMPLANT, BILATERAL Bilateral 2001   CYSTOSCOPY WITH RETROGRADE PYELOGRAM, URETEROSCOPY AND STENT PLACEMENT Right 02/25/2020   Procedure: CYSTOSCOPY WITH RETROGRADE PYELOGRAM, URETEROSCOPY AND STENT EXCHANGE;  Surgeon: Alexis Frock, MD;  Location: [facility];  Service: Urology;  Laterality: Right;  75 MINS   CYSTOSCOPY WITH STENT PLACEMENT Right 01/27/2020   Procedure: CYSTOSCOPY WITH STENT PLACEMENT;  Surgeon: Alexis Frock, MD;  Location: [facility];  Service: Urology;  Laterality: Right;   CYSTOSCOPY/URETEROSCOPY/HOLMIUM LASER/STENT PLACEMENT Left 06-27-2018  dr Amalia Hailey,  '@[facility]'    HOLMIUM LASER APPLICATION Right 02/72/5366   Procedure: HOLMIUM LASER APPLICATION;  Surgeon: Alexis Frock, MD;  Location[facility];  Service: Urology;  Laterality: Right;   I &  D EXTREMITY Right 10/26/2017   Procedure: IRRIGATION AND DEBRIDEMENT RIGHT HAND;  Surgeon: Iran Planas, MD;  Location: [facility];  Service: Orthopedics;  Laterality: Right;   LUMBAR LAMINECTOMY/DECOMPRESSION MICRODISCECTOMY  04/11/2011   Procedure: LUMBAR LAMINECTOMY/DECOMPRESSION MICRODISCECTOMY;  Surgeon: [name], MD;  Location: [facility];  Service: Neurosurgery;  Laterality: N/A;  Lumbar Five-Sacral One Microdiscectomy   MELANOMA EXCISION Right 1980s   right calf   SPLENECTOMY, TOTAL  1998   TOTAL HIP ARTHROPLASTY Right 1999   UPPER GASTROINTESTINAL ENDOSCOPY      Family History  Problem Relation Age of Onset    Heart disease Mother    Heart disease Father    Alzheimer's disease Sister    Dementia Sister    Dementia Sister    Heart disease Other        uncles   Clotting disorder Maternal Uncle    Breast cancer Paternal 56    Cancer Paternal Aunt        breast   Stroke Sister    Dementia Sister        mean, carotid artery disease   Colon cancer Neg Hx    Anesthesia problems Neg Hx    Hypotension Neg Hx    Malignant hyperthermia Neg Hx    Pseudochol deficiency Neg Hx     Social History   Socioeconomic History   Marital status: Married    Spouse name: Not on file   Number of children: 4   Years of education: Not on file   Highest education level: Not on file  Occupational History   Occupation: Retired  Tobacco Use   Smoking status: Never   Smokeless tobacco: Never  Vaping Use   Vaping Use: Never used  Substance and Sexual Activity   Alcohol use: No    Alcohol/week: 0.0 standard drinks   Drug use: Never   Sexual activity: Not on file  Other Topics Concern   Not on file  Social History Narrative   Married 1955   2 sons - '59, '61, 2 daughters '55, '57; 8 grandchildren; 1 great grand   I-ADLs      Social Determinants of Radio broadcast assistant Strain: Not on file  Food Insecurity: Not on file  Transportation Needs: Not on file  Physical Activity: Not on file  Stress: Not on file  Social Connections: Not on file  Intimate Partner Violence: Not on file    Outpatient Medications Prior to Visit  Medication Sig Dispense Refill   Accu-Chek Softclix Lancets lancets Use to check sugar daily or as needed. 100 each 1   B Complex Vitamins (VITAMIN B COMPLEX PO) Take 1 tablet by mouth daily.      blood glucose meter kit and supplies KIT Dispense based on patient and insurance preference. Use up to four times daily as directed. Dx: E11.9 1 each 0   Blood Pressure Monitoring (BLOOD PRESSURE KIT) DEVI 1 Device by Does not apply route daily as needed. Fill per patient  insurance preference Dx: Hypertension 1 Device 0   Cranberry 1000 MG CAPS Take 1 capsule by mouth 2 (two) times daily.     fluconazole (DIFLUCAN) 150 MG tablet 1 po x1, may repeat in 3 days prn 2 tablet 0   furosemide (LASIX) 20 MG tablet Take 1 tablet (20 mg total) by mouth daily as needed for fluid or edema. 90 tablet 3   furosemide (LASIX) 40 MG tablet Take 1 tablet (40 mg total) by mouth daily.  30 tablet 3   glucose blood (ACCU-CHEK GUIDE) test strip USE TO TEST BLOOD SUGAR UP TO 4 TIMES DAILY AS INSTRUCTED 100 strip 5   nitrofurantoin (MACRODANTIN) 50 MG capsule Take 1 capsule (50 mg total) by mouth at bedtime. 30 capsule 2   potassium chloride SA (KLOR-CON M20) 20 MEQ tablet Take 1 tablet (20 mEq total) by mouth daily. 90 tablet 1   valsartan (DIOVAN) 160 MG tablet TAKE 1 TABLET BY MOUTH EVERY DAY 30 tablet 2   valsartan (DIOVAN) 320 MG tablet Take 1 tablet (320 mg total) by mouth daily. 90 tablet 3   warfarin (COUMADIN) 5 MG tablet TAKE 1 TABLET DAILY EXCEPT TAKE 1/2 TABLET MON WED AND FRIDAYS OR TAKE AS DIRECTED BY ANTICOAGULATION CLINIC 90 tablet 1   No facility-administered medications prior to visit.    Allergies  Allergen Reactions   Amlodipine Swelling    "my whole body swelled"   Carvedilol Swelling    "my whole body swelled"   Chlorthalidone     Patient reported extreme weakness.   Hydralazine Other (See Comments)    Joint and chest pain   Statins Other (See Comments)    Weakness, pain Lipitor and Pravachol Weakness, pain Lipitor and Pravachol   Zetia [Ezetimibe]     Unable to walk, severe leg pain   Cefuroxime Axetil     REACTION: Ddoes not remember reaction   Sulfonamide Derivatives Rash    REACTION: Rash    ROS Review of Systems  Constitutional:  Negative for appetite change, diaphoresis, fatigue and unexpected weight change.  Eyes:  Negative for pain, redness and visual disturbance.  Respiratory:  Negative for cough, chest tightness, shortness of breath  and wheezing.   Cardiovascular:  Negative for chest pain, palpitations and leg swelling.  Endocrine: Negative for cold intolerance, heat intolerance, polydipsia, polyphagia and polyuria.  Genitourinary:  Negative for difficulty urinating, dysuria and frequency.  Neurological:  Negative for dizziness, light-headedness, numbness and headaches.     Objective:    Physical Exam Vitals and nursing note reviewed.  Constitutional:      Appearance: She is well-developed.  HENT:     Head: Normocephalic and atraumatic.  Eyes:     Conjunctiva/sclera: Conjunctivae normal.  Neck:     Thyroid: No thyromegaly.     Vascular: No carotid bruit or JVD.  Cardiovascular:     Rate and Rhythm: Normal rate and regular rhythm.     Heart sounds: Normal heart sounds. No murmur heard. Pulmonary:     Effort: Pulmonary effort is normal. No respiratory distress.     Breath sounds: Normal breath sounds. No wheezing or rales.  Chest:     Chest wall: No tenderness.  Abdominal:     Tenderness: There is abdominal tenderness in the suprapubic area. There is no guarding or rebound.  Musculoskeletal:     Cervical back: Normal range of motion and neck supple.  Neurological:     General: No focal deficit present.     Mental Status: She is alert and oriented to person, place, and time.    BP 140/80 (BP Location: Left Arm, Patient Position: Sitting, Cuff Size: Normal)    Pulse 79    Temp 97.8 F (36.6 C) (Oral)    Resp 20    Ht '5\' 3"'  (1.6 m)    Wt 166 lb 12.8 oz (75.7 kg)    SpO2 96%    BMI 29.55 kg/m  Wt Readings from Last 3 Encounters:  04/05/21 166  lb 12.8 oz (75.7 kg)  03/17/21 170 lb 12.8 oz (77.5 kg)  03/16/21 174 lb (78.9 kg)     Health Maintenance Due  Topic Date Due   OPHTHALMOLOGY EXAM  06/17/2019   COVID-19 Vaccine (4 - Booster for Pfizer series) 01/21/2020    There are no preventive care reminders to display for this patient.  Lab Results  Component Value Date   TSH 5.06 (H) 07/06/2020    Lab Results  Component Value Date   WBC 8.8 03/16/2021   HGB 13.8 03/16/2021   HCT 42.2 03/16/2021   MCV 91.9 03/16/2021   PLT 310 03/16/2021   Lab Results  Component Value Date   NA 138 03/16/2021   K 3.8 03/16/2021   CHLORIDE 109 08/12/2013   CO2 23 03/16/2021   GLUCOSE 104 (H) 03/16/2021   BUN 19 03/16/2021   CREATININE 0.67 03/16/2021   BILITOT 0.6 03/16/2021   ALKPHOS 56 03/16/2021   AST 19 03/16/2021   ALT 16 03/16/2021   PROT 7.2 03/16/2021   ALBUMIN 3.8 03/16/2021   CALCIUM 9.0 03/16/2021   ANIONGAP 10 03/16/2021   GFR 63.52 01/06/2021   Lab Results  Component Value Date   CHOL 223 (H) 01/06/2021   Lab Results  Component Value Date   HDL 36.30 (L) 01/06/2021   Lab Results  Component Value Date   LDLCALC 143 (H) 12/19/2019   Lab Results  Component Value Date   TRIG 349.0 (H) 01/06/2021   Lab Results  Component Value Date   CHOLHDL 6 01/06/2021   Lab Results  Component Value Date   HGBA1C 6.7 (H) 01/06/2021      Assessment & Plan:   Problem List Items Addressed This Visit       Unprioritized   Essential hypertension    Well controlled, no changes to meds. Encouraged heart healthy diet such as the DASH diet and exercise as tolerated.       Urinary tract infection without hematuria - Primary    Culture pending macrobid and diflucan sent in       Relevant Medications   nitrofurantoin, macrocrystal-monohydrate, (MACROBID) 100 MG capsule   fluconazole (DIFLUCAN) 150 MG tablet   Other Visit Diagnoses     Lower abdominal pain       Relevant Orders   POCT Urinalysis Dipstick (Automated) (Completed)   Abnormal urine finding       Relevant Orders   Urine Culture   Primary hypertension           Meds ordered this encounter  Medications   nitrofurantoin, macrocrystal-monohydrate, (MACROBID) 100 MG capsule    Sig: Take 1 capsule (100 mg total) by mouth 2 (two) times daily.    Dispense:  14 capsule    Refill:  0   fluconazole  (DIFLUCAN) 150 MG tablet    Sig: 1 po x1, may repeat in 3 days prn    Dispense:  2 tablet    Refill:  0    Follow-up: Return in about 6 months (around 10/06/2021), or if symptoms worsen or fail to improve.    Ann Held, DO

## 2021-04-05 NOTE — Assessment & Plan Note (Signed)
Culture pending ?macrobid and diflucan sent in ? ?

## 2021-04-05 NOTE — Patient Instructions (Signed)
DASH Eating Plan °DASH stands for Dietary Approaches to Stop Hypertension. The DASH eating plan is a healthy eating plan that has been shown to: °Reduce high blood pressure (hypertension). °Reduce your risk for type 2 diabetes, heart disease, and stroke. °Help with weight loss. °What are tips for following this plan? °Reading food labels °Check food labels for the amount of salt (sodium) per serving. Choose foods with less than 5 percent of the Daily Value of sodium. Generally, foods with less than 300 milligrams (mg) of sodium per serving fit into this eating plan. °To find whole grains, look for the word "whole" as the first word in the ingredient list. °Shopping °Buy products labeled as "low-sodium" or "no salt added." °Buy fresh foods. Avoid canned foods and pre-made or frozen meals. °Cooking °Avoid adding salt when cooking. Use salt-free seasonings or herbs instead of table salt or sea salt. Check with your health care provider or pharmacist before using salt substitutes. °Do not fry foods. Cook foods using healthy methods such as baking, boiling, grilling, roasting, and broiling instead. °Cook with heart-healthy oils, such as olive, canola, avocado, soybean, or sunflower oil. °Meal planning ° °Eat a balanced diet that includes: °4 or more servings of fruits and 4 or more servings of vegetables each day. Try to fill one-half of your plate with fruits and vegetables. °6-8 servings of whole grains each day. °Less than 6 oz (170 g) of lean meat, poultry, or fish each day. A 3-oz (85-g) serving of meat is about the same size as a deck of cards. One egg equals 1 oz (28 g). °2-3 servings of low-fat dairy each day. One serving is 1 cup (237 mL). °1 serving of nuts, seeds, or beans 5 times each week. °2-3 servings of heart-healthy fats. Healthy fats called omega-3 fatty acids are found in foods such as walnuts, flaxseeds, fortified milks, and eggs. These fats are also found in cold-water fish, such as sardines, salmon,  and mackerel. °Limit how much you eat of: °Canned or prepackaged foods. °Food that is high in trans fat, such as some fried foods. °Food that is high in saturated fat, such as fatty meat. °Desserts and other sweets, sugary drinks, and other foods with added sugar. °Full-fat dairy products. °Do not salt foods before eating. °Do not eat more than 4 egg yolks a week. °Try to eat at least 2 vegetarian meals a week. °Eat more home-cooked food and less restaurant, buffet, and fast food. °Lifestyle °When eating at a restaurant, ask that your food be prepared with less salt or no salt, if possible. °If you drink alcohol: °Limit how much you use to: °0-1 drink a day for women who are not pregnant. °0-2 drinks a day for men. °Be aware of how much alcohol is in your drink. In the [place] one drink equals one 12 oz bottle of beer (355 mL), one 5 oz glass of wine (148 mL), or one 1½ oz glass of hard liquor (44 mL). °General information °Avoid eating more than 2,300 mg of salt a day. If you have hypertension, you may need to reduce your sodium intake to 1,500 mg a day. °Work with your health care provider to maintain a healthy body weight or to lose weight. Ask what an ideal weight is for you. °Get at least 30 minutes of exercise that causes your heart to beat faster (aerobic exercise) most days of the week. Activities may include walking, swimming, or biking. °Work with your health care provider or dietitian to   adjust your eating plan to your individual calorie needs. °What foods should I eat? °Fruits °All fresh, dried, or frozen fruit. Canned fruit in natural juice (without added sugar). °Vegetables °Fresh or frozen vegetables (raw, steamed, roasted, or grilled). Low-sodium or reduced-sodium tomato and vegetable juice. Low-sodium or reduced-sodium tomato sauce and tomato paste. Low-sodium or reduced-sodium canned vegetables. °Grains °Whole-grain or whole-wheat bread. Whole-grain or whole-wheat pasta. Brown rice. Oatmeal. Quinoa.  Bulgur. Whole-grain and low-sodium cereals. Pita bread. Low-fat, low-sodium crackers. Whole-wheat flour tortillas. °Meats and other proteins °Skinless chicken or turkey. Ground chicken or turkey. Pork with fat trimmed off. Fish and seafood. Egg whites. Dried beans, peas, or lentils. Unsalted nuts, nut butters, and seeds. Unsalted canned beans. Lean cuts of beef with fat trimmed off. Low-sodium, lean precooked or cured meat, such as sausages or meat loaves. °Dairy °Low-fat (1%) or fat-free (skim) milk. Reduced-fat, low-fat, or fat-free cheeses. Nonfat, low-sodium ricotta or cottage cheese. Low-fat or nonfat yogurt. Low-fat, low-sodium cheese. °Fats and oils °Soft margarine without trans fats. Vegetable oil. Reduced-fat, low-fat, or light mayonnaise and salad dressings (reduced-sodium). Canola, safflower, olive, avocado, soybean, and sunflower oils. Avocado. °Seasonings and condiments °Herbs. Spices. Seasoning mixes without salt. °Other foods °Unsalted popcorn and pretzels. Fat-free sweets. °The items listed above may not be a complete list of foods and beverages you can eat. Contact a dietitian for more information. °What foods should I avoid? °Fruits °Canned fruit in a light or heavy syrup. Fried fruit. Fruit in cream or butter sauce. °Vegetables °Creamed or fried vegetables. Vegetables in a cheese sauce. Regular canned vegetables (not low-sodium or reduced-sodium). Regular canned tomato sauce and paste (not low-sodium or reduced-sodium). Regular tomato and vegetable juice (not low-sodium or reduced-sodium). Pickles. Olives. °Grains °Baked goods made with fat, such as croissants, muffins, or some breads. Dry pasta or rice meal packs. °Meats and other proteins °Fatty cuts of meat. Ribs. Fried meat. Bacon. Bologna, salami, and other precooked or cured meats, such as sausages or meat loaves. Fat from the back of a pig (fatback). Bratwurst. Salted nuts and seeds. Canned beans with added salt. Canned or smoked fish.  Whole eggs or egg yolks. Chicken or turkey with skin. °Dairy °Whole or 2% milk, cream, and half-and-half. Whole or full-fat cream cheese. Whole-fat or sweetened yogurt. Full-fat cheese. Nondairy creamers. Whipped toppings. Processed cheese and cheese spreads. °Fats and oils °Butter. Stick margarine. Lard. Shortening. Ghee. Bacon fat. Tropical oils, such as coconut, palm kernel, or palm oil. °Seasonings and condiments °Onion salt, garlic salt, seasoned salt, table salt, and sea salt. Worcestershire sauce. Tartar sauce. Barbecue sauce. Teriyaki sauce. Soy sauce, including reduced-sodium. Steak sauce. Canned and packaged gravies. Fish sauce. Oyster sauce. Cocktail sauce. Store-bought horseradish. Ketchup. Mustard. Meat flavorings and tenderizers. Bouillon cubes. Hot sauces. Pre-made or packaged marinades. Pre-made or packaged taco seasonings. Relishes. Regular salad dressings. °Other foods °Salted popcorn and pretzels. °The items listed above may not be a complete list of foods and beverages you should avoid. Contact a dietitian for more information. °Where to find more information °National Heart, Lung, and Blood Institute: www.nhlbi.nih.gov °American Heart Association: www.heart.org °Academy of Nutrition and Dietetics: www.eatright.org °National Kidney Foundation: www.kidney.org °Summary °The DASH eating plan is a healthy eating plan that has been shown to reduce high blood pressure (hypertension). It may also reduce your risk for type 2 diabetes, heart disease, and stroke. °When on the DASH eating plan, aim to eat more fresh fruits and vegetables, whole grains, lean proteins, low-fat dairy, and heart-healthy fats. °With the DASH   eating plan, you should limit salt (sodium) intake to 2,300 mg a day. If you have hypertension, you may need to reduce your sodium intake to 1,500 mg a day. °Work with your health care provider or dietitian to adjust your eating plan to your individual calorie needs. °This information is not  intended to replace advice given to you by your health care provider. Make sure you discuss any questions you have with your health care provider. °Document Revised: 12/20/2018 Document Reviewed: 12/20/2018 °Elsevier Patient Education © 2022 Elsevier Inc. ° °

## 2021-04-05 NOTE — Assessment & Plan Note (Signed)
Well controlled, no changes to meds. Encouraged heart healthy diet such as the DASH diet and exercise as tolerated.  °

## 2021-04-08 LAB — URINE CULTURE
MICRO NUMBER:: 13098020
SPECIMEN QUALITY:: ADEQUATE

## 2021-04-14 DIAGNOSIS — D485 Neoplasm of uncertain behavior of skin: Secondary | ICD-10-CM | POA: Diagnosis not present

## 2021-04-14 DIAGNOSIS — C4442 Squamous cell carcinoma of skin of scalp and neck: Secondary | ICD-10-CM | POA: Diagnosis not present

## 2021-04-22 ENCOUNTER — Other Ambulatory Visit: Payer: Self-pay

## 2021-04-22 ENCOUNTER — Ambulatory Visit: Payer: Medicare PPO

## 2021-04-22 DIAGNOSIS — Z7901 Long term (current) use of anticoagulants: Secondary | ICD-10-CM | POA: Diagnosis not present

## 2021-04-22 LAB — POCT INR: INR: 4.5 — AB (ref 2.0–3.0)

## 2021-04-22 NOTE — Patient Instructions (Addendum)
Pre visit review using our clinic review tool, if applicable. No additional management support is needed unless otherwise documented below in the visit note. ? ?Hold dose today and hold dose tomorrow and then change weekly dose to take 1/2 tablet daily except take 1 tablet on Mondays and Thursdays. Recheck in 2 weeks.  ?

## 2021-04-22 NOTE — Progress Notes (Addendum)
Pt finished abx, macrobid, one week ago and also had to take fluconazole after that for a yeast infection. Fluconazole has a major interaction with warfarin. Pt was also in the ER for hypertension. ?Hold dose today and hold dose tomorrow and then change weekly dose to take 1/2 tablet daily except take 1 tablet on Mondays and Thursdays. Recheck in 2 weeks.  ?

## 2021-04-26 ENCOUNTER — Ambulatory Visit: Payer: Medicare PPO

## 2021-05-03 ENCOUNTER — Ambulatory Visit: Payer: Medicare PPO

## 2021-05-04 ENCOUNTER — Telehealth: Payer: Self-pay

## 2021-05-04 NOTE — Telephone Encounter (Signed)
Pt NS coumadin clinic apt yesterday at [facility]. Tried to contact pt to RS but no answer and no VM ?

## 2021-05-05 NOTE — Telephone Encounter (Signed)
Tried to contact pt again but no answer and no VM ?

## 2021-05-12 ENCOUNTER — Telehealth: Payer: Self-pay

## 2021-05-12 NOTE — Telephone Encounter (Signed)
Pt called to RS her coumadin clinic apt she missed. She has ben busy taking care of her daughter who is in stage IV ovarian cancer. RS apt for 4/18. ?

## 2021-05-17 ENCOUNTER — Ambulatory Visit: Payer: Medicare PPO

## 2021-05-17 DIAGNOSIS — Z7901 Long term (current) use of anticoagulants: Secondary | ICD-10-CM

## 2021-05-17 LAB — POCT INR: INR: 2 (ref 2.0–3.0)

## 2021-05-17 NOTE — Patient Instructions (Addendum)
Pre visit review using our clinic review tool, if applicable. No additional management support is needed unless otherwise documented below in the visit note.  Continue 1/2 tablet daily except take 1 tablet on Mondays and Thursdays .  Recheck in 4 weeks.  

## 2021-05-17 NOTE — Progress Notes (Signed)
Continue 1/2 tablet daily except take 1 tablet on Mondays and Thursdays .  Recheck in 4 weeks.  

## 2021-06-14 ENCOUNTER — Ambulatory Visit: Payer: Medicare PPO

## 2021-06-14 DIAGNOSIS — Z7901 Long term (current) use of anticoagulants: Secondary | ICD-10-CM

## 2021-06-14 LAB — POCT INR: INR: 1.8 — AB (ref 2.0–3.0)

## 2021-06-14 NOTE — Progress Notes (Addendum)
Increase to taker 1 tablet and the continue 1/2 tablet daily except take 1 tablet on Mondays and Thursdays. Recheck in 4 weeks per pt request  ?

## 2021-06-14 NOTE — Patient Instructions (Addendum)
Pre visit review using our clinic review tool, if applicable. No additional management support is needed unless otherwise documented below in the visit note. ? ?Increase to taker 1 tablet and the continue 1/2 tablet daily except take 1 tablet on Mondays and Thursdays. Recheck in 4 weeks.  ?

## 2021-06-28 ENCOUNTER — Telehealth: Payer: Self-pay | Admitting: Cardiovascular Disease

## 2021-06-28 NOTE — Telephone Encounter (Signed)
New Message:     Patient wants to know if Dr Johnsie Cancel will see her husband as a new patient?

## 2021-06-28 NOTE — Telephone Encounter (Signed)
Patient called to see if Dr. Johnsie Cancel can see her husband as a new patient.  Will forward to Dr. Kyla Balzarine nurse to review and schedule appt if available.

## 2021-06-29 NOTE — Telephone Encounter (Signed)
Called patient back and encouraged her to have her husband follow up with his PCP and have PCP refer him if needed. Patient verbalized understanding.

## 2021-06-30 ENCOUNTER — Other Ambulatory Visit: Payer: Self-pay | Admitting: Family Medicine

## 2021-06-30 ENCOUNTER — Other Ambulatory Visit (INDEPENDENT_AMBULATORY_CARE_PROVIDER_SITE_OTHER): Payer: Medicare PPO

## 2021-06-30 ENCOUNTER — Encounter: Payer: Self-pay | Admitting: Family Medicine

## 2021-06-30 ENCOUNTER — Telehealth: Payer: Self-pay | Admitting: Family Medicine

## 2021-06-30 DIAGNOSIS — N390 Urinary tract infection, site not specified: Secondary | ICD-10-CM

## 2021-06-30 LAB — POC URINALSYSI DIPSTICK (AUTOMATED)
Blood, UA: NEGATIVE
Glucose, UA: NEGATIVE
Leukocytes, UA: NEGATIVE
Nitrite, UA: POSITIVE
Protein, UA: NEGATIVE
Spec Grav, UA: >=1.03 — AB (ref 1.010–1.025)
Urobilinogen, UA: 0.2 E.U./dL
pH, UA: 5 (ref 5.0–8.0)

## 2021-06-30 MED ORDER — NITROFURANTOIN MONOHYD MACRO 100 MG PO CAPS: 100.0000 mg | ORAL_CAPSULE | Freq: Two times a day (BID) | ORAL | 0 refills | Status: DC

## 2021-06-30 NOTE — Telephone Encounter (Signed)
Appt made

## 2021-06-30 NOTE — Telephone Encounter (Signed)
Pt called requesting orders be put in for a urine teat for a UTI. Pt was advised that Dr. Etter Sjogren may want to see her and she does have openings on 6.2.23 (tomorrow). Pt declined appt saying she didn't need to be seen and wanted to have the sample taken today due to pain.

## 2021-06-30 NOTE — Telephone Encounter (Signed)
Orders have been placed. She can schedule now

## 2021-06-30 NOTE — Telephone Encounter (Signed)
FYI patient came in for lab only visit to check her urine.

## 2021-06-30 NOTE — Telephone Encounter (Signed)
Patient called back to make a lab appointment for an urine test, she was informed that lab orders were not put in yet and therefore we could not schedule one. Advised patient that an afternoon appointment could be sent up for tomorrow but patient stated it that it was too late. Please advise.

## 2021-07-02 LAB — URINE CULTURE
MICRO NUMBER:: 13470625
SPECIMEN QUALITY:: ADEQUATE

## 2021-07-04 DIAGNOSIS — H02824 Cysts of left upper eyelid: Secondary | ICD-10-CM | POA: Diagnosis not present

## 2021-07-07 ENCOUNTER — Ambulatory Visit: Payer: Medicare PPO | Admitting: Family Medicine

## 2021-07-12 ENCOUNTER — Ambulatory Visit: Payer: Medicare PPO

## 2021-07-12 DIAGNOSIS — Z7901 Long term (current) use of anticoagulants: Secondary | ICD-10-CM

## 2021-07-12 LAB — POCT INR: INR: 1.6 — AB (ref 2.0–3.0)

## 2021-07-12 NOTE — Progress Notes (Addendum)
Increase to take 1 tablet today and then change weekly dose to take  1/2 tablet daily except take 1 tablet on Mondays, Wednesdays, and Fridays. Recheck in 2 weeks.

## 2021-07-12 NOTE — Patient Instructions (Addendum)
Pre visit review using our clinic review tool, if applicable. No additional management support is needed unless otherwise documented below in the visit note.  Increase to take 1 tablet today and then change weekly dose to take  1/2 tablet daily except take 1 tablet on Mondays, Wednesdays, and Fridays. Recheck in 2 weeks.

## 2021-07-14 ENCOUNTER — Ambulatory Visit: Payer: Medicare PPO | Admitting: Family Medicine

## 2021-07-14 ENCOUNTER — Encounter: Payer: Self-pay | Admitting: Family Medicine

## 2021-07-14 VITALS — BP 138/90 | HR 84 | Temp 98.0°F | Resp 18 | Ht 63.0 in | Wt 175.2 lb

## 2021-07-14 DIAGNOSIS — E1169 Type 2 diabetes mellitus with other specified complication: Secondary | ICD-10-CM | POA: Diagnosis not present

## 2021-07-14 DIAGNOSIS — R5383 Other fatigue: Secondary | ICD-10-CM

## 2021-07-14 DIAGNOSIS — R531 Weakness: Secondary | ICD-10-CM | POA: Diagnosis not present

## 2021-07-14 DIAGNOSIS — N390 Urinary tract infection, site not specified: Secondary | ICD-10-CM | POA: Diagnosis not present

## 2021-07-14 DIAGNOSIS — E785 Hyperlipidemia, unspecified: Secondary | ICD-10-CM | POA: Diagnosis not present

## 2021-07-14 LAB — CBC WITH DIFFERENTIAL/PLATELET
Basophils Absolute: 0.1 10*3/uL (ref 0.0–0.1)
Basophils Relative: 1 % (ref 0.0–3.0)
Eosinophils Absolute: 0.2 10*3/uL (ref 0.0–0.7)
Eosinophils Relative: 2.4 % (ref 0.0–5.0)
HCT: 44.2 % (ref 36.0–46.0)
Hemoglobin: 14.1 g/dL (ref 12.0–15.0)
Lymphocytes Relative: 48.8 % — ABNORMAL HIGH (ref 12.0–46.0)
Lymphs Abs: 4.3 10*3/uL — ABNORMAL HIGH (ref 0.7–4.0)
MCHC: 31.9 g/dL (ref 30.0–36.0)
MCV: 91.9 fl (ref 78.0–100.0)
Monocytes Absolute: 0.9 10*3/uL (ref 0.1–1.0)
Monocytes Relative: 10.5 % (ref 3.0–12.0)
Neutro Abs: 3.3 10*3/uL (ref 1.4–7.7)
Neutrophils Relative %: 37.3 % — ABNORMAL LOW (ref 43.0–77.0)
Platelets: 324 10*3/uL (ref 150.0–400.0)
RBC: 4.81 Mil/uL (ref 3.87–5.11)
RDW: 15.2 % (ref 11.5–15.5)
WBC: 8.9 10*3/uL (ref 4.0–10.5)

## 2021-07-14 LAB — POC URINALSYSI DIPSTICK (AUTOMATED)
Bilirubin, UA: NEGATIVE
Blood, UA: NEGATIVE
Glucose, UA: NEGATIVE
Ketones, UA: NEGATIVE
Leukocytes, UA: NEGATIVE
Nitrite, UA: NEGATIVE
Protein, UA: NEGATIVE
Spec Grav, UA: 1.02 (ref 1.010–1.025)
Urobilinogen, UA: 0.2 E.U./dL
pH, UA: 6 (ref 5.0–8.0)

## 2021-07-14 LAB — COMPREHENSIVE METABOLIC PANEL
ALT: 11 U/L (ref 0–35)
AST: 16 U/L (ref 0–37)
Albumin: 4.1 g/dL (ref 3.5–5.2)
Alkaline Phosphatase: 72 U/L (ref 39–117)
BUN: 22 mg/dL (ref 6–23)
CO2: 30 mEq/L (ref 19–32)
Calcium: 9.6 mg/dL (ref 8.4–10.5)
Chloride: 105 mEq/L (ref 96–112)
Creatinine, Ser: 0.83 mg/dL (ref 0.40–1.20)
GFR: 64.21 mL/min (ref >60.00)
Glucose, Bld: 102 mg/dL — ABNORMAL HIGH (ref 70–99)
Potassium: 4.9 mEq/L (ref 3.5–5.1)
Sodium: 142 mEq/L (ref 135–145)
Total Bilirubin: 0.6 mg/dL (ref 0.2–1.2)
Total Protein: 7.2 g/dL (ref 6.0–8.3)

## 2021-07-14 LAB — VITAMIN D 25 HYDROXY (VIT D DEFICIENCY, FRACTURES): VITD: 26.23 ng/mL — ABNORMAL LOW (ref 30.00–100.00)

## 2021-07-14 LAB — VITAMIN B12: Vitamin B-12: 508 pg/mL (ref 211–911)

## 2021-07-14 LAB — LDL CHOLESTEROL, DIRECT: Direct LDL: 144 mg/dL

## 2021-07-14 LAB — LIPID PANEL
Cholesterol: 226 mg/dL — ABNORMAL HIGH (ref 0–200)
HDL: 36.8 mg/dL — ABNORMAL LOW (ref >39.00)
NonHDL: 189.53
Total CHOL/HDL Ratio: 6
Triglycerides: 261 mg/dL — ABNORMAL HIGH (ref 0.0–149.0)
VLDL: 52.2 mg/dL — ABNORMAL HIGH (ref 0.0–40.0)

## 2021-07-14 LAB — TSH: TSH: 3.13 u[IU]/mL (ref 0.35–5.50)

## 2021-07-14 NOTE — Patient Instructions (Signed)
Weakness Weakness is a lack of strength. You may feel weak all over your body (generalized), or you may feel weak in one part of your body (focal). Common causes of weakness include: Infection and disorders of the body's defense system (immune system). Physical exhaustion. Internal bleeding or other blood loss that results in a lack of red blood cells (anemia). Dehydration. An imbalance in mineral (electrolyte) levels, such as potassium. Chronic kidney or liver disease. Cancer. Other causes include: Some medicines or cancer treatment. Stress, anxiety, or depression. Heart disease, circulation problems, or stroke. Nervous system disorders. Thyroid disorders. Loss of muscle strength because of age or inactivity. Poor sleep quality or sleep disorders. The cause of your weakness may not be known. Some causes of weakness can be serious, so it is important to see your health care provider. Follow these instructions at home: Activity Rest as needed. Try to get enough sleep. Most adults need 7-8 hours of quality sleep each night. Talk to your health care provider about how much sleep you need. Do exercises, such as arm curls and leg raises, for 30 minutes at least 2 days a week or as told by your health care provider. This helps build muscle strength. Consider working with a physical therapist or trainer who can develop an exercise plan to help you gain muscle strength. General instructions  Take over-the-counter and prescription medicines only as told by your health care provider. Eat a healthy, well-balanced diet. This includes: Proteins to build muscles, such as lean meats and fish. Fresh fruits and vegetables. Carbohydrates to boost energy, such as whole grains. Drink enough fluid to keep your urine pale yellow. Keep all follow-up visits. This is important. Contact a health care provider if: Your weakness does not improve or gets worse. Your weakness affects your ability to think  clearly. Your weakness affects your ability to do your normal daily activities. Get help right away if: You develop sudden weakness, especially on one side of your face or body. You have chest pain. You have trouble breathing or shortness of breath. You have problems with your vision. You have trouble talking or swallowing. You have trouble standing or walking. You are light-headed or lose consciousness. These symptoms may be an emergency. Get help right away. Call 911. Do not wait to see if the symptoms will go away. Do not drive yourself to the hospital. Summary Weakness is a lack of strength. You may feel weak all over your body or just in one specific part of your body. Weakness can be caused by a variety of things. In some cases, the cause may be unknown. Rest as needed, and try to get enough sleep. Most adults need 7-8 hours of quality sleep each night. Eat a healthy, well-balanced diet. This information is not intended to replace advice given to you by your health care provider. Make sure you discuss any questions you have with your health care provider. Document Revised: 12/19/2020 Document Reviewed: 12/19/2020 Elsevier Patient Education  2023 Elsevier Inc.  

## 2021-07-14 NOTE — Progress Notes (Signed)
Subjective:   By signing my name below, I, [name], attest that this documentation has been prepared under the direction and in the presence of Ann Held, DO  07/14/2021    Patient ID: [name] Little, female    DOB: 08-15-1933, 88 y.o.   MRN: [id number]  Chief Complaint  Patient presents with   Urinary Tract Infection    Pt states still not feeling well. She is not sure if it is the UTI or due to her coumadin being low at 1.6. Levels were checked on 07/12/21   Follow-up    Urinary Tract Infection  Pertinent negatives include no vomiting.   Patient is in today for a follow up visit.   She complains of fatigue and weakness in both legs. Her INR levels have been low for the past couple of months. She thinks her symptoms may also be a UTI. She continues taking 40 mg lasix daily PO and reports no new issues while taking it.  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   anticoagulated on coumadin   GAD (generalized anxiety disorder)    Heart murmur    Hemorrhoids    History of avascular necrosis of capital femoral epiphysis 1999   s/p THA right   History of basal cell carcinoma (BCC) excision    scalp/ neck   History of Clostridium difficile infection    History of DVT of lower extremity 1998;  04/ 2006   LLE   History of external beam radiation therapy 2010   right breast cancer   History of ITP    per pt hx long-term use prednisone for ITP,   1998 s/p total splenctomy   History of kidney stones    History of melanoma 1980s   s/p  right calf melanoma excision , per pt not malignant and localized ,  no recurrence   History of paroxysmal supraventricular tachycardia    (02-23-2020  pt stated have not had any issues / symptoms in several years   History of pulmonary embolus (PE) 04/2004   History of right breast cancer 2010   08-10-2008 s/p  right lumpectomy w/ sln bx (partial mastectomy) , ER+, Stage I,  completed radiation 2010,  per pt no recurrance   Hypertension    Macular degeneration, bilateral    mild   Mitral valve regurgitation 09/01/2016   cardiology-- dr Johnsie Cancel--- per note mild to moderate without stenosis,     Mixed hyperlipidemia 05/18/2015   Nocturia    OA (osteoarthritis)    lumbar stenosis, radiculopathy, OA- L hip   Personal history of colonic polyps    hyperplastic   Renal calculus, right    Right ureteral calculus    Thoracic ascending aortic aneurysm ([place])    last CT angio chest in epic 11-29-2016  , 3.7cm    Past Surgical History:  Procedure Laterality  Date   ABDOMINAL HYSTERECTOMY  1973   BREAST LUMPECTOMY WITH RADIOACTIVE SEED AND SENTINEL LYMPH NODE BIOPSY Right 08/10/2008   '@[facility]'    CATARACT EXTRACTION W/ INTRAOCULAR LENS  IMPLANT, BILATERAL Bilateral 2001   CYSTOSCOPY WITH RETROGRADE PYELOGRAM, URETEROSCOPY AND STENT PLACEMENT Right 02/25/2020   Procedure: CYSTOSCOPY WITH RETROGRADE PYELOGRAM, URETEROSCOPY AND STENT EXCHANGE;  Surgeon: Alexis Frock, MD;  Location: [facility];  Service: Urology;  Laterality: Right;  75 MINS   CYSTOSCOPY WITH STENT PLACEMENT Right 01/27/2020   Procedure: CYSTOSCOPY WITH STENT PLACEMENT;  Surgeon: Alexis Frock, MD;  Location: [facility];  Service: Urology;  Laterality: Right;   CYSTOSCOPY/URETEROSCOPY/HOLMIUM LASER/STENT PLACEMENT Left 06-27-2018  dr Amalia Hailey,  '@[facility]'    HOLMIUM LASER APPLICATION Right 84/53/6468   Procedure: HOLMIUM LASER APPLICATION;   Surgeon: Alexis Frock, MD;  Location: [facility];  Service: Urology;  Laterality: Right;   I & D EXTREMITY Right 10/26/2017   Procedure: IRRIGATION AND DEBRIDEMENT RIGHT HAND;  Surgeon: Iran Planas, MD;  Location: [facility];  Service: Orthopedics;  Laterality: Right;   LUMBAR LAMINECTOMY/DECOMPRESSION MICRODISCECTOMY  04/11/2011   Procedure: LUMBAR LAMINECTOMY/DECOMPRESSION MICRODISCECTOMY;  Surgeon: [name], MD;  Location: [facility];  Service: Neurosurgery;  Laterality: N/A;  Lumbar Five-Sacral One Microdiscectomy   MELANOMA EXCISION Right 1980s   right calf   SPLENECTOMY, TOTAL  1998   TOTAL HIP ARTHROPLASTY Right 1999   UPPER GASTROINTESTINAL ENDOSCOPY      Family History  Problem Relation Age of Onset   Heart disease Mother    Heart disease Father    Alzheimer's disease Sister    Dementia Sister    Dementia Sister    Heart disease Other        uncles   Clotting disorder Maternal Uncle    Breast cancer Paternal 73    Cancer Paternal Aunt        breast   Stroke Sister    Dementia Sister        mean, carotid artery disease   Colon cancer Neg Hx    Anesthesia problems Neg Hx    Hypotension Neg Hx    Malignant hyperthermia Neg Hx    Pseudochol deficiency Neg Hx     Social History   Socioeconomic History   Marital status: Married    Spouse name: Not on file   Number of children: 4   Years of education: Not on file   Highest education level: Not on file  Occupational History   Occupation: Retired  Tobacco Use   Smoking status: Never   Smokeless tobacco: Never  Vaping Use   Vaping Use: Never used  Substance and Sexual Activity   Alcohol use: No    Alcohol/week: 0.0 standard drinks of alcohol   Drug use: Never   Sexual activity: Not on file  Other Topics Concern   Not on file  Social History Narrative   Married 1955   2 sons - '59, '61, 2 daughters '55, '57; 8 grandchildren; 1 great grand   I-ADLs      Social Determinants of Adult nurse Strain: Not on file  Food Insecurity: Not on file  Transportation Needs: Not on file  Physical Activity: Not on file  Stress: Not on file  Social Connections: Not on file  Intimate Partner Violence: Not on file    Outpatient Medications Prior to Visit  Medication Sig Dispense Refill   Accu-Chek Softclix Lancets lancets Use to check sugar daily or as  needed. 100 each 1   B Complex Vitamins (VITAMIN B COMPLEX PO) Take 1 tablet by mouth daily.      blood glucose meter kit and supplies KIT Dispense based on patient and insurance preference. Use up to four times daily as directed. Dx: E11.9 1 each 0   Blood Pressure Monitoring (BLOOD PRESSURE KIT) DEVI 1 Device by Does not apply route daily as needed. Fill per patient insurance preference Dx: Hypertension 1 Device 0   Cranberry 1000 MG CAPS Take 1 capsule by mouth 2 (two) times daily.     fluconazole (DIFLUCAN) 150 MG tablet 1 po x1, may repeat in 3 days prn 2 tablet 0   fluconazole (DIFLUCAN) 150 MG tablet 1 po x1, may repeat in 3 days prn 2 tablet 0   furosemide (LASIX) 20 MG tablet Take 1 tablet (20 mg total) by mouth daily as needed for fluid or edema. 90 tablet 3   furosemide (LASIX) 40 MG tablet Take 1 tablet (40 mg total) by mouth daily. 30 tablet 3   glucose blood (ACCU-CHEK GUIDE) test strip USE TO TEST BLOOD SUGAR UP TO 4 TIMES DAILY AS INSTRUCTED 100 strip 5   nitrofurantoin (MACRODANTIN) 50 MG capsule Take 1 capsule (50 mg total) by mouth at bedtime. 30 capsule 2   nitrofurantoin, macrocrystal-monohydrate, (MACROBID) 100 MG capsule Take 1 capsule (100 mg total) by mouth 2 (two) times daily. 14 capsule 0   nitrofurantoin, macrocrystal-monohydrate, (MACROBID) 100 MG capsule Take 1 capsule (100 mg total) by mouth 2 (two) times daily. 14 capsule 0   potassium chloride SA (KLOR-CON M20) 20 MEQ tablet Take 1 tablet (20 mEq total) by mouth daily. 90 tablet 1   valsartan (DIOVAN) 160 MG tablet TAKE 1 TABLET BY MOUTH  EVERY DAY 30 tablet 2   valsartan (DIOVAN) 320 MG tablet Take 1 tablet (320 mg total) by mouth daily. 90 tablet 3   warfarin (COUMADIN) 5 MG tablet TAKE 1 TABLET DAILY EXCEPT TAKE 1/2 TABLET MON WED AND FRIDAYS OR TAKE AS DIRECTED BY ANTICOAGULATION CLINIC 90 tablet 1   No facility-administered medications prior to visit.    Allergies  Allergen Reactions   Amlodipine Swelling    "my whole body swelled"   Carvedilol Swelling    "my whole body swelled"   Chlorthalidone     Patient reported extreme weakness.   Hydralazine Other (See Comments)    Joint and chest pain   Statins Other (See Comments)    Weakness, pain Lipitor and Pravachol Weakness, pain Lipitor and Pravachol   Zetia [Ezetimibe]     Unable to walk, severe leg pain   Cefuroxime Axetil     REACTION: Ddoes not remember reaction   Sulfonamide Derivatives Rash    REACTION: Rash    Review of Systems  Constitutional:  Positive for malaise/fatigue. Negative for fever.  HENT:  Negative for congestion.   Eyes:  Negative for blurred vision.  Respiratory:  Negative for cough and shortness of breath.   Cardiovascular:  Negative for chest pain, palpitations and leg swelling.  Gastrointestinal:  Negative for vomiting.  Musculoskeletal:  Negative for back pain.  Skin:  Negative for rash.  Neurological:  Positive for weakness. Negative for headaches. Loss of consciousness: bilateral legs.      Objective:    Physical Exam Vitals and nursing note reviewed.  Constitutional:      General: She is not in acute distress.    Appearance: Normal appearance. She is not ill-appearing.  HENT:  Head: Normocephalic and atraumatic.     Right Ear: External ear normal.     Left Ear: External ear normal.  Eyes:     Extraocular Movements: Extraocular movements intact.     Pupils: Pupils are equal, round, and reactive to light.  Cardiovascular:     Rate and Rhythm: Normal rate and regular rhythm.     Heart sounds: Normal heart  sounds. No murmur heard.    No gallop.  Pulmonary:     Effort: Pulmonary effort is normal. No respiratory distress.     Breath sounds: Normal breath sounds. No wheezing or rales.  Skin:    General: Skin is warm and dry.  Neurological:     Mental Status: She is alert and oriented to person, place, and time.  Psychiatric:        Judgment: Judgment normal.     BP 138/90 (BP Location: Right Arm, Patient Position: Sitting, Cuff Size: Normal)   Pulse 84   Temp 98 F (36.7 C) (Oral)   Resp 18   Ht '5\' 3"'  (1.6 m)   Wt 175 lb 3.2 oz (79.5 kg)   SpO2 96%   BMI 31.04 kg/m  Wt Readings from Last 3 Encounters:  07/14/21 175 lb 3.2 oz (79.5 kg)  04/05/21 166 lb 12.8 oz (75.7 kg)  03/17/21 170 lb 12.8 oz (77.5 kg)    Diabetic Foot Exam - Simple   No data filed    Lab Results  Component Value Date   WBC 8.9 07/14/2021   HGB 14.1 07/14/2021   HCT 44.2 07/14/2021   PLT 324.0 07/14/2021   GLUCOSE 102 (H) 07/14/2021   CHOL 226 (H) 07/14/2021   TRIG 261.0 (H) 07/14/2021   HDL 36.80 (L) 07/14/2021   LDLDIRECT 144.0 07/14/2021   LDLCALC 143 (H) 12/19/2019   ALT 11 07/14/2021   AST 16 07/14/2021   NA 142 07/14/2021   K 4.9 07/14/2021   CL 105 07/14/2021   CREATININE 0.83 07/14/2021   BUN 22 07/14/2021   CO2 30 07/14/2021   TSH 3.13 07/14/2021   INR 1.6 (A) 07/12/2021   HGBA1C 6.7 (H) 01/06/2021   MICROALBUR 1.2 07/06/2020    Lab Results  Component Value Date   TSH 3.13 07/14/2021   Lab Results  Component Value Date   WBC 8.9 07/14/2021   HGB 14.1 07/14/2021   HCT 44.2 07/14/2021   MCV 91.9 07/14/2021   PLT 324.0 07/14/2021   Lab Results  Component Value Date   NA 142 07/14/2021   K 4.9 07/14/2021   CHLORIDE 109 08/12/2013   CO2 30 07/14/2021   GLUCOSE 102 (H) 07/14/2021   BUN 22 07/14/2021   CREATININE 0.83 07/14/2021   BILITOT 0.6 07/14/2021   ALKPHOS 72 07/14/2021   AST 16 07/14/2021   ALT 11 07/14/2021   PROT 7.2 07/14/2021   ALBUMIN 4.1 07/14/2021    CALCIUM 9.6 07/14/2021   ANIONGAP 10 03/16/2021   GFR 64.21 07/14/2021   Lab Results  Component Value Date   CHOL 226 (H) 07/14/2021   Lab Results  Component Value Date   HDL 36.80 (L) 07/14/2021   Lab Results  Component Value Date   LDLCALC 143 (H) 12/19/2019   Lab Results  Component Value Date   TRIG 261.0 (H) 07/14/2021   Lab Results  Component Value Date   CHOLHDL 6 07/14/2021   Lab Results  Component Value Date   HGBA1C 6.7 (H) 01/06/2021       Assessment &  Plan:   Problem List Items Addressed This Visit       Unprioritized   Other fatigue   Relevant Orders   CBC with Differential/Platelet (Completed)   Comprehensive metabolic panel (Completed)   Lipid panel (Completed)   TSH (Completed)   Vitamin B12 (Completed)   VITAMIN D 25 Hydroxy (Vit-D Deficiency, Fractures) (Completed)   Urinary tract infection without hematuria - Primary   Relevant Orders   POCT Urinalysis Dipstick (Automated) (Completed)   Other Visit Diagnoses     Weakness       Relevant Orders   CBC with Differential/Platelet (Completed)   Comprehensive metabolic panel (Completed)   Lipid panel (Completed)   TSH (Completed)   Vitamin B12 (Completed)   VITAMIN D 25 Hydroxy (Vit-D Deficiency, Fractures) (Completed)        No orders of the defined types were placed in this encounter.   IAnn Held, DO, personally preformed the services described in this documentation.  All medical record entries made by the scribe were at my direction and in my presence.  I have reviewed the chart and discharge instructions (if applicable) and agree that the record reflects my personal performance and is accurate and complete. 07/14/2021   I,[name],acting as a Education administrator for [employer], DO.,have documented all relevant documentation on the behalf of Ann Held, DO,as directed by  Ann Held, DO while in the presence of Ann Held, DO.   Ann Held, DO

## 2021-07-17 ENCOUNTER — Encounter: Payer: Self-pay | Admitting: Family Medicine

## 2021-07-17 DIAGNOSIS — Z789 Other specified health status: Secondary | ICD-10-CM | POA: Insufficient documentation

## 2021-07-21 ENCOUNTER — Other Ambulatory Visit: Payer: Self-pay | Admitting: *Deleted

## 2021-07-21 DIAGNOSIS — L821 Other seborrheic keratosis: Secondary | ICD-10-CM | POA: Diagnosis not present

## 2021-07-21 DIAGNOSIS — L814 Other melanin hyperpigmentation: Secondary | ICD-10-CM | POA: Diagnosis not present

## 2021-07-21 DIAGNOSIS — L570 Actinic keratosis: Secondary | ICD-10-CM | POA: Diagnosis not present

## 2021-07-21 DIAGNOSIS — D225 Melanocytic nevi of trunk: Secondary | ICD-10-CM | POA: Diagnosis not present

## 2021-07-21 DIAGNOSIS — D2239 Melanocytic nevi of other parts of face: Secondary | ICD-10-CM | POA: Diagnosis not present

## 2021-07-21 DIAGNOSIS — L989 Disorder of the skin and subcutaneous tissue, unspecified: Secondary | ICD-10-CM | POA: Diagnosis not present

## 2021-07-21 DIAGNOSIS — D485 Neoplasm of uncertain behavior of skin: Secondary | ICD-10-CM | POA: Diagnosis not present

## 2021-07-21 MED ORDER — VITAMIN D (ERGOCALCIFEROL) 1.25 MG (50000 UNIT) PO CAPS: 50000.0000 [IU] | ORAL_CAPSULE | ORAL | 0 refills | Status: DC

## 2021-07-26 ENCOUNTER — Ambulatory Visit: Payer: Medicare PPO

## 2021-07-26 DIAGNOSIS — Z7901 Long term (current) use of anticoagulants: Secondary | ICD-10-CM

## 2021-07-26 LAB — POCT INR: INR: 2.5 (ref 2.0–3.0)

## 2021-07-31 ENCOUNTER — Other Ambulatory Visit: Payer: Self-pay | Admitting: Family Medicine

## 2021-07-31 DIAGNOSIS — Z7901 Long term (current) use of anticoagulants: Secondary | ICD-10-CM

## 2021-08-01 ENCOUNTER — Other Ambulatory Visit: Payer: Self-pay | Admitting: Family Medicine

## 2021-08-01 DIAGNOSIS — Z7901 Long term (current) use of anticoagulants: Secondary | ICD-10-CM

## 2021-09-06 ENCOUNTER — Ambulatory Visit: Payer: Medicare PPO

## 2021-09-06 DIAGNOSIS — Z7901 Long term (current) use of anticoagulants: Secondary | ICD-10-CM | POA: Diagnosis not present

## 2021-09-06 LAB — POCT INR: INR: 2.1 (ref 2.0–3.0)

## 2021-09-06 NOTE — Progress Notes (Signed)
Continue 1/2 tablet daily except take 1 tablet on Mondays, Wednesdays, and Fridays. Recheck in 6 weeks. 

## 2021-09-06 NOTE — Patient Instructions (Addendum)
Pre visit review using our clinic review tool, if applicable. No additional management support is needed unless otherwise documented below in the visit note.  Continue 1/2 tablet daily except take 1 tablet on Mondays, Wednesdays, and Fridays. Recheck in 6 weeks. 

## 2021-09-19 ENCOUNTER — Other Ambulatory Visit: Payer: Self-pay | Admitting: Family Medicine

## 2021-09-19 ENCOUNTER — Encounter: Payer: Self-pay | Admitting: Family Medicine

## 2021-09-19 DIAGNOSIS — I10 Essential (primary) hypertension: Secondary | ICD-10-CM

## 2021-09-19 DIAGNOSIS — Z7901 Long term (current) use of anticoagulants: Secondary | ICD-10-CM

## 2021-09-19 MED ORDER — VALSARTAN 320 MG PO TABS: 320.0000 mg | ORAL_TABLET | Freq: Every day | ORAL | 1 refills | Status: DC

## 2021-10-07 ENCOUNTER — Encounter: Payer: Self-pay | Admitting: Family Medicine

## 2021-10-10 ENCOUNTER — Encounter: Payer: Self-pay | Admitting: Family Medicine

## 2021-10-10 ENCOUNTER — Other Ambulatory Visit: Payer: Self-pay | Admitting: Family Medicine

## 2021-10-10 ENCOUNTER — Ambulatory Visit (HOSPITAL_BASED_OUTPATIENT_CLINIC_OR_DEPARTMENT_OTHER)
Admission: RE | Admit: 2021-10-10 | Discharge: 2021-10-10 | Disposition: A | Discharge status: Home / Self Care | Payer: Medicare PPO | Source: Ambulatory Visit | Attending: Family Medicine | Admitting: Family Medicine

## 2021-10-10 ENCOUNTER — Ambulatory Visit: Payer: Medicare PPO | Admitting: Family Medicine

## 2021-10-10 VITALS — BP 128/78 | HR 85 | Temp 98.7°F | Resp 22 | Ht 63.0 in | Wt 175.8 lb

## 2021-10-10 DIAGNOSIS — R079 Chest pain, unspecified: Secondary | ICD-10-CM | POA: Diagnosis not present

## 2021-10-10 DIAGNOSIS — J208 Acute bronchitis due to other specified organisms: Secondary | ICD-10-CM | POA: Insufficient documentation

## 2021-10-10 DIAGNOSIS — U071 COVID-19: Secondary | ICD-10-CM

## 2021-10-10 DIAGNOSIS — E785 Hyperlipidemia, unspecified: Secondary | ICD-10-CM | POA: Diagnosis not present

## 2021-10-10 DIAGNOSIS — E1169 Type 2 diabetes mellitus with other specified complication: Secondary | ICD-10-CM

## 2021-10-10 DIAGNOSIS — I10 Essential (primary) hypertension: Secondary | ICD-10-CM | POA: Diagnosis not present

## 2021-10-10 DIAGNOSIS — E669 Obesity, unspecified: Secondary | ICD-10-CM | POA: Diagnosis not present

## 2021-10-10 MED ORDER — AZITHROMYCIN 250 MG PO TABS: ORAL_TABLET | ORAL | 0 refills | Status: DC

## 2021-10-10 MED ORDER — AMOXICILLIN-POT CLAVULANATE 875-125 MG PO TABS: 1.0000 | ORAL_TABLET | Freq: Two times a day (BID) | ORAL | 0 refills | Status: DC

## 2021-10-10 MED ORDER — PROMETHAZINE-DM 6.25-15 MG/5ML PO SYRP: 5.0000 mL | ORAL_SOLUTION | Freq: Four times a day (QID) | ORAL | 0 refills | Status: DC | PRN

## 2021-10-10 MED ORDER — METHYLPREDNISOLONE ACETATE 80 MG/ML IJ SUSP
80.0000 mg | Freq: Once | INTRAMUSCULAR | Status: AC
  Administered 2021-10-10: 80 mg via INTRAMUSCULAR

## 2021-10-10 MED ORDER — PREDNISONE 10 MG PO TABS: ORAL_TABLET | ORAL | 0 refills | Status: DC

## 2021-10-10 NOTE — Telephone Encounter (Signed)
Pt had visit today.

## 2021-10-10 NOTE — Patient Instructions (Signed)
Quarantine and Isolation Quarantine and isolation refer to local and travel restrictions to protect the public and travelers from contagious diseases that constitute a public health threat. Contagious diseases are diseases that can spread from one person to another. Quarantine and isolation help to protect the public by preventing exposure to people who have or may have a contagious disease. Isolation separates people who are sick with a contagious disease from people who are not sick. Quarantine separates and restricts the movement of people who were exposed to a contagious disease to see if they become sick. You may be put in quarantine or isolation if you have been exposed to or diagnosed with any of the following diseases: Severe acute respiratory syndromes, such as COVID-19. Cholera. Diphtheria. Tuberculosis. Plague. Smallpox. Yellow fever. Viral hemorrhagic fevers, such as [place], Ebola, and [place]. When to quarantine or isolate Follow these rules, whether you have been vaccinated or not: Stay home and isolate from others when you are sick with a contagious disease. Isolate when you test positive for a contagious disease, even if you do not have symptoms. Isolate if you are sick and suspect that you may have a contagious disease. If you suspect that you have a contagious disease, get tested. If your test results are negative, you can end your isolation. If your test results are positive, follow the full isolation recommendations as told by your health care provider or local health authorities. Quarantine and stay away from others when you have been in close contact with someone who has tested positive for a contagious disease. Close contact is defined as being less than 6 ft (1.8 m) away from an infected person for a total of 15 minutes or more over a 24-hour period. Do not go to places where you are unable to wear a mask, such as restaurants and some gyms. Stay home and separate  from others as much as possible. Avoid being around people who may get very sick from the contagious disease that you have. Use a separate bathroom, if possible. Do not travel. For travel guidance, visit the CDC's travel webpage at wwwnc.cdc.gov/travel/ Follow these instructions at home: Medicines  Take over-the-counter and prescription medicines as told by your health care provider. Finish all antibiotic medicine even when you start to feel better. Stay up to date with all your vaccines. Get scheduled vaccines and boosters as recommended by your health care provider. Lifestyle Wear a high-quality mask if you must be around others at home and in public, if recommended. Improve air flow (ventilation) at home to help prevent the disease from spreading to other people, if possible. Do not share personal household items, like cups, towels, and utensils. Practice everyday hygiene and cleaning. General instructions Talk to your health care provider if you have a weakened body defense system (immune system). People with a weakened immune system may have a reduced immune response to vaccines. You may need to follow current prevention measures, including wearing a well-fitting mask, avoiding crowds, and avoiding poorly ventilated indoor places. Monitor symptoms and follow health care provider instructions, which may include resting, drinking fluids, and taking medicines. Follow specific isolation and quarantine recommendations if you are in places that can lead to disease outbreaks, such as correctional and detention facilities, homeless shelters, and cruise ships. Return to your normal activities as told by your health care provider. Ask your health care provider what activities are safe for you. Keep all follow-up visits. This is important. Where to find more information CDC: www.cdc.gov/quarantine/index.html Contact   a health care provider if: You have a fever. You have signs and symptoms that  return or get worse after isolation. Get help right away if: You have difficulty breathing. You have chest pain. These symptoms may be an emergency. Get help right away. Call 911. Do not wait to see if the symptoms will go away. Do not drive yourself to the hospital. Summary Isolation and quarantine help protect the public by preventing exposure to people who have or may have a contagious disease. Isolate when you are sick or when you test positive, even if you do not have symptoms. Quarantine and stay away from others when you have been in close contact with someone who has tested positive for a contagious disease. This information is not intended to replace advice given to you by your health care provider. Make sure you discuss any questions you have with your health care provider. Document Revised: 01/27/2021 Document Reviewed: 01/06/2021 Elsevier Patient Education  2023 Elsevier Inc.  

## 2021-10-10 NOTE — Assessment & Plan Note (Signed)
cxr   abx Depo medrol and pred tpaer  If sob worsens -- -go to ER

## 2021-10-10 NOTE — Progress Notes (Signed)
Subjective:   By signing my name below, I, [name] Little, attest that this documentation has been prepared under the direction and in the presence of Ann Held DO 10/10/2021   Patient ID: [name] Little, female    DOB: 02-Mar-1933, 88 y.o.   MRN: [id number]  Chief Complaint  Patient presents with   Covid Positive    Pt states test COVID positive last week. Pt states having a lot of congestion    HPI Patient is in today for an office visit. She is accompanied by her partner.   She reports that she is Covid-19 positive. Symptoms begun the evening of 10/02/2021. She took the test on 10/07/2021 and results showed positive. She reports that she coughs up phlegm and has congestion that is mostly located in her chest with symptoms occasionally appearing in the nose. She is currently taking sinus and allergy OTC medications such as Zyrtec and DayQuil/NyQuil. She has a low-grade fever of about 100 F.   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   anticoagulated on coumadin   GAD (generalized anxiety disorder)    Heart murmur    Hemorrhoids    History of avascular necrosis of capital femoral epiphysis 1999   s/p THA right   History of basal cell carcinoma (BCC) excision    scalp/ neck   History of Clostridium difficile infection    History of DVT of lower extremity 1998;  04/ 2006   LLE   History of external beam radiation therapy 2010   right breast cancer   History of ITP    per pt hx long-term use prednisone for ITP,  1998 s/p total splenctomy   History of kidney stones    History of melanoma 1980s   s/p  right calf melanoma excision , per pt not malignant and localized ,  no recurrence   History of paroxysmal supraventricular tachycardia    (02-23-2020  pt stated have not  had any issues / symptoms in several years   History of pulmonary embolus (PE) 04/2004   History of right breast cancer 2010   08-10-2008 s/p  right lumpectomy w/ sln bx (partial mastectomy) , ER+, Stage I,  completed radiation 2010,  per pt no recurrance   Hypertension    Macular degeneration, bilateral    mild   Mitral valve regurgitation 09/01/2016   cardiology-- dr Johnsie Cancel--- per note mild to moderate without stenosis,     Mixed hyperlipidemia 05/18/2015   Nocturia    OA (osteoarthritis)    lumbar stenosis, radiculopathy, OA- L hip   Personal history of colonic polyps    hyperplastic   Renal calculus, right    Right ureteral calculus    Thoracic ascending aortic aneurysm ([place])    last CT angio chest in epic 11-29-2016  , 3.7cm    Past Surgical History:  Procedure Laterality Date   ABDOMINAL HYSTERECTOMY  1973   BREAST LUMPECTOMY WITH RADIOACTIVE SEED AND SENTINEL LYMPH NODE BIOPSY Right 08/10/2008   '@[facility]'    CATARACT EXTRACTION W/ INTRAOCULAR LENS  IMPLANT, BILATERAL Bilateral 2001   CYSTOSCOPY WITH RETROGRADE PYELOGRAM, URETEROSCOPY AND STENT PLACEMENT Right 02/25/2020   Procedure: CYSTOSCOPY WITH RETROGRADE PYELOGRAM, URETEROSCOPY AND STENT EXCHANGE;  Surgeon: Alexis Frock, MD;  Location: [facility];  Service: Urology;  Laterality: Right;  75 MINS   CYSTOSCOPY WITH STENT PLACEMENT Right 01/27/2020   Procedure: CYSTOSCOPY WITH STENT PLACEMENT;  Surgeon: Alexis Frock, MD;  Location: [facility];  Service: Urology;  Laterality: Right;   CYSTOSCOPY/URETEROSCOPY/HOLMIUM LASER/STENT PLACEMENT Left 06-27-2018  dr Amalia Hailey,  '@[facility]'    HOLMIUM LASER APPLICATION Right 51/76/1607   Procedure: HOLMIUM LASER APPLICATION;  Surgeon: Alexis Frock, MD;  Location: [facility];  Service: Urology;  Laterality: Right;   I & D EXTREMITY Right 10/26/2017   Procedure: IRRIGATION AND DEBRIDEMENT RIGHT HAND;  Surgeon: Iran Planas, MD;  Location: [facility];  Service:  Orthopedics;  Laterality: Right;   LUMBAR LAMINECTOMY/DECOMPRESSION MICRODISCECTOMY  04/11/2011   Procedure: LUMBAR LAMINECTOMY/DECOMPRESSION MICRODISCECTOMY;  Surgeon: [name], MD;  Location: [facility];  Service: Neurosurgery;  Laterality: N/A;  Lumbar Five-Sacral One Microdiscectomy   MELANOMA EXCISION Right 1980s   right calf   SPLENECTOMY, TOTAL  1998   TOTAL HIP ARTHROPLASTY Right 1999   UPPER GASTROINTESTINAL ENDOSCOPY      Family History  Problem Relation Age of Onset   Heart disease Mother    Heart disease Father    Alzheimer's disease Sister    Dementia Sister    Dementia Sister    Heart disease Other        uncles   Clotting disorder Maternal Uncle    Breast cancer Paternal 86    Cancer Paternal Aunt        breast   Stroke Sister    Dementia Sister        mean, carotid artery disease   Colon cancer Neg Hx    Anesthesia problems Neg Hx    Hypotension Neg Hx    Malignant hyperthermia Neg Hx    Pseudochol deficiency Neg Hx     Social History   Socioeconomic History   Marital status: Married    Spouse name: Not on file   Number of children: 4   Years of education: Not on file   Highest education level: Not on file  Occupational History   Occupation: Retired  Tobacco Use   Smoking status: Never   Smokeless tobacco: Never  Vaping Use   Vaping Use: Never used  Substance and Sexual Activity   Alcohol use: No    Alcohol/week: 0.0 standard drinks of alcohol   Drug use: Never   Sexual activity: Not on file  Other Topics Concern   Not on file  Social History Narrative   Married 1955   2 sons - '59, '61, 2 daughters '55, '57; 8 grandchildren; 1 great grand   I-ADLs      Social Determinants of Radio broadcast assistant Strain: Not on file  Food Insecurity: Not on file  Transportation Needs: Not on file  Physical Activity: Not on file  Stress: Not on file  Social Connections: Not on file  Intimate Partner Violence: Not on file     Outpatient Medications Prior to Visit  Medication Sig Dispense Refill   Accu-Chek Softclix Lancets lancets Use to check sugar daily or as needed. 100 each 1   B Complex Vitamins (VITAMIN B COMPLEX PO) Take 1 tablet by mouth daily.      blood glucose meter kit and supplies KIT Dispense based on patient and insurance preference. Use up to four times daily as directed. Dx: E11.9 1 each 0   Blood Pressure Monitoring (BLOOD PRESSURE KIT) DEVI 1 Device by Does not apply route daily  as needed. Fill per patient insurance preference Dx: Hypertension 1 Device 0   Cranberry 1000 MG CAPS Take 1 capsule by mouth 2 (two) times daily.     fluconazole (DIFLUCAN) 150 MG tablet 1 po x1, may repeat in 3 days prn 2 tablet 0   fluconazole (DIFLUCAN) 150 MG tablet 1 po x1, may repeat in 3 days prn 2 tablet 0   furosemide (LASIX) 20 MG tablet Take 1 tablet (20 mg total) by mouth daily as needed for fluid or edema. 90 tablet 3   furosemide (LASIX) 40 MG tablet Take 1 tablet (40 mg total) by mouth daily. 30 tablet 3   glucose blood (ACCU-CHEK GUIDE) test strip USE TO TEST BLOOD SUGAR UP TO 4 TIMES DAILY AS INSTRUCTED 100 strip 5   nitrofurantoin (MACRODANTIN) 50 MG capsule Take 1 capsule (50 mg total) by mouth at bedtime. 30 capsule 2   nitrofurantoin, macrocrystal-monohydrate, (MACROBID) 100 MG capsule Take 1 capsule (100 mg total) by mouth 2 (two) times daily. 14 capsule 0   nitrofurantoin, macrocrystal-monohydrate, (MACROBID) 100 MG capsule Take 1 capsule (100 mg total) by mouth 2 (two) times daily. 14 capsule 0   potassium chloride SA (KLOR-CON M20) 20 MEQ tablet Take 1 tablet (20 mEq total) by mouth daily. 90 tablet 1   valsartan (DIOVAN) 320 MG tablet Take 1 tablet (320 mg total) by mouth daily. 90 tablet 1   Vitamin D, Ergocalciferol, (DRISDOL) 1.25 MG (50000 UNIT) CAPS capsule Take 1 capsule (50,000 Units total) by mouth every 7 (seven) days. 12 capsule 0   warfarin (COUMADIN) 5 MG tablet PLEASE SEE ATTACHED  FOR DETAILED DIRECTIONS 90 tablet 1   No facility-administered medications prior to visit.    Allergies  Allergen Reactions   Amlodipine Swelling    "my whole body swelled"   Carvedilol Swelling    "my whole body swelled"   Chlorthalidone     Patient reported extreme weakness.   Hydralazine Other (See Comments)    Joint and chest pain   Statins Other (See Comments)    Weakness, pain Lipitor and Pravachol Weakness, pain Lipitor and Pravachol   Zetia [Ezetimibe]     Unable to walk, severe leg pain   Cefuroxime Axetil     REACTION: Ddoes not remember reaction   Sulfonamide Derivatives Rash    REACTION: Rash    Review of Systems  Constitutional:  Positive for fever. Negative for malaise/fatigue.  HENT:  Positive for congestion.   Eyes:  Negative for blurred vision.  Respiratory:  Positive for cough (W/ Phlegm), sputum production, shortness of breath and wheezing.   Cardiovascular:  Negative for chest pain, palpitations and leg swelling.  Gastrointestinal:  Negative for abdominal pain, blood in stool and nausea.  Genitourinary:  Negative for dysuria and frequency.  Musculoskeletal:  Negative for falls.  Skin:  Negative for rash.  Neurological:  Negative for dizziness, loss of consciousness and headaches.  Endo/Heme/Allergies:  Negative for environmental allergies.  Psychiatric/Behavioral:  Negative for depression. The patient is not nervous/anxious.        Objective:    Physical Exam Vitals and nursing note reviewed.  Constitutional:      General: She is not in acute distress.    Appearance: Normal appearance. She is not ill-appearing.  HENT:     Head: Normocephalic and atraumatic.     Right Ear: External ear normal.     Left Ear: External ear normal.  Eyes:     Extraocular Movements: Extraocular movements intact.  Pupils: Pupils are equal, round, and reactive to light.  Cardiovascular:     Rate and Rhythm: Normal rate and regular rhythm.     Heart sounds:  Normal heart sounds. No murmur heard.    No gallop.  Pulmonary:     Effort: Pulmonary effort is normal. No respiratory distress.     Breath sounds: Wheezing present. No rales.  Skin:    General: Skin is warm and dry.  Neurological:     Mental Status: She is alert and oriented to person, place, and time.  Psychiatric:        Judgment: Judgment normal.     BP 128/78 (BP Location: Right Arm, Patient Position: Sitting, Cuff Size: Large)   Pulse 85   Temp 98.7 F (37.1 C) (Oral)   Resp (!) 22   Ht '5\' 3"'  (1.6 m)   Wt 175 lb 12.8 oz (79.7 kg)   SpO2 93%   BMI 31.14 kg/m  Wt Readings from Last 3 Encounters:  10/10/21 175 lb 12.8 oz (79.7 kg)  07/14/21 175 lb 3.2 oz (79.5 kg)  04/05/21 166 lb 12.8 oz (75.7 kg)    Diabetic Foot Exam - Simple   No data filed    Lab Results  Component Value Date   WBC 8.9 07/14/2021   HGB 14.1 07/14/2021   HCT 44.2 07/14/2021   PLT 324.0 07/14/2021   GLUCOSE 102 (H) 07/14/2021   CHOL 226 (H) 07/14/2021   TRIG 261.0 (H) 07/14/2021   HDL 36.80 (L) 07/14/2021   LDLDIRECT 144.0 07/14/2021   LDLCALC 143 (H) 12/19/2019   ALT 11 07/14/2021   AST 16 07/14/2021   NA 142 07/14/2021   K 4.9 07/14/2021   CL 105 07/14/2021   CREATININE 0.83 07/14/2021   BUN 22 07/14/2021   CO2 30 07/14/2021   TSH 3.13 07/14/2021   INR 2.1 09/06/2021   HGBA1C 6.7 (H) 01/06/2021   MICROALBUR 1.2 07/06/2020    Lab Results  Component Value Date   TSH 3.13 07/14/2021   Lab Results  Component Value Date   WBC 8.9 07/14/2021   HGB 14.1 07/14/2021   HCT 44.2 07/14/2021   MCV 91.9 07/14/2021   PLT 324.0 07/14/2021   Lab Results  Component Value Date   NA 142 07/14/2021   K 4.9 07/14/2021   CHLORIDE 109 08/12/2013   CO2 30 07/14/2021   GLUCOSE 102 (H) 07/14/2021   BUN 22 07/14/2021   CREATININE 0.83 07/14/2021   BILITOT 0.6 07/14/2021   ALKPHOS 72 07/14/2021   AST 16 07/14/2021   ALT 11 07/14/2021   PROT 7.2 07/14/2021   ALBUMIN 4.1 07/14/2021    CALCIUM 9.6 07/14/2021   ANIONGAP 10 03/16/2021   GFR 64.21 07/14/2021   Lab Results  Component Value Date   CHOL 226 (H) 07/14/2021   Lab Results  Component Value Date   HDL 36.80 (L) 07/14/2021   Lab Results  Component Value Date   LDLCALC 143 (H) 12/19/2019   Lab Results  Component Value Date   TRIG 261.0 (H) 07/14/2021   Lab Results  Component Value Date   CHOLHDL 6 07/14/2021   Lab Results  Component Value Date   HGBA1C 6.7 (H) 01/06/2021       Assessment & Plan:   Problem List Items Addressed This Visit       Unprioritized   Diabetes mellitus type 2 in obese ([place])   Relevant Orders   Lipid panel   Hemoglobin A1c  Acute bronchitis due to COVID-19 virus - Primary    cxr   abx Depo medrol and pred tpaer  If sob worsens -- -go to ER      Relevant Medications   amoxicillin-clavulanate (AUGMENTIN) 875-125 MG tablet   predniSONE (DELTASONE) 10 MG tablet   promethazine-dextromethorphan (PROMETHAZINE-DM) 6.25-15 MG/5ML syrup   Other Relevant Orders   DG Chest 2 View (Completed)   CBC with Differential/Platelet   Comprehensive metabolic panel   Other Visit Diagnoses     Primary hypertension       Relevant Orders   Lipid panel   Hyperlipidemia associated with type 2 diabetes mellitus ([place])       Relevant Orders   Lipid panel      Meds ordered this encounter  Medications   amoxicillin-clavulanate (AUGMENTIN) 875-125 MG tablet    Sig: Take 1 tablet by mouth 2 (two) times daily.    Dispense:  20 tablet    Refill:  0   predniSONE (DELTASONE) 10 MG tablet    Sig: TAKE 3 TABLETS PO QD FOR 3 DAYS THEN TAKE 2 TABLETS PO QD FOR 3 DAYS THEN TAKE 1 TABLET PO QD FOR 3 DAYS THEN TAKE 1/2 TAB PO QD FOR 3 DAYS    Dispense:  20 tablet    Refill:  0   promethazine-dextromethorphan (PROMETHAZINE-DM) 6.25-15 MG/5ML syrup    Sig: Take 5 mLs by mouth 4 (four) times daily as needed.    Dispense:  118 mL    Refill:  0   methylPREDNISolone acetate (DEPO-MEDROL)  injection 80 mg    I, Ann Held, DO, personally preformed the services described in this documentation.  All medical record entries made by the scribe were at my direction and in my presence.  I have reviewed the chart and discharge instructions (if applicable) and agree that the record reflects my personal performance and is accurate and complete. 10/10/2021   I,Amber Collins,acting as a scribe for [employer], DO.,have documented all relevant documentation on the behalf of Ann Held, DO,as directed by  Ann Held, DO while in the presence of Ann Held, DO.    Ann Held, DO

## 2021-10-11 ENCOUNTER — Telehealth: Payer: Self-pay

## 2021-10-11 NOTE — Telephone Encounter (Signed)
Pt LVM reporting she has been treated for COVID and prescribed augmentin x 10 days, prednisone x 12 days, and promethazine-DM. Potential interactions with Augmentin and prednisone. Next INR check is 9/19. Would like to RS to 9/15.  Pt reports she was also prescribed a z-pack and is taking Augmentin also with prednisone. Azithromycin will moderately increase effect of warfarin and prednisone will normally decrease the effects of warfarin. Augmentin does not interact with warfarin. With two medication interacting but causing opposite effects pt's dosing will not be adjusted and  pt will have INR reckeck in one week. Pt is scheduled for 9/19. Educated pt if she had any signs or symptoms of abnormal bruising or bleeding to contact there office or go to the ER. Pt verbalized understanding.

## 2021-10-17 ENCOUNTER — Ambulatory Visit: Payer: Medicare PPO | Admitting: Family Medicine

## 2021-10-17 ENCOUNTER — Ambulatory Visit: Payer: Medicare PPO | Admitting: Medical

## 2021-10-17 ENCOUNTER — Encounter: Payer: Self-pay | Admitting: Medical

## 2021-10-17 VITALS — BP 159/80 | HR 89 | Temp 98.0°F | Resp 20 | Ht 63.0 in | Wt 169.4 lb

## 2021-10-17 DIAGNOSIS — R059 Cough, unspecified: Secondary | ICD-10-CM | POA: Diagnosis not present

## 2021-10-17 DIAGNOSIS — U071 COVID-19: Secondary | ICD-10-CM | POA: Diagnosis not present

## 2021-10-17 DIAGNOSIS — J208 Acute bronchitis due to other specified organisms: Secondary | ICD-10-CM

## 2021-10-17 MED ORDER — ALBUTEROL SULFATE HFA 108 (90 BASE) MCG/ACT IN AERS: 2.0000 | INHALATION_SPRAY | Freq: Four times a day (QID) | RESPIRATORY_TRACT | 0 refills | Status: DC | PRN

## 2021-10-17 NOTE — Progress Notes (Signed)
Subjective:    Patient ID: [name], female    DOB: [birth year], 88 y.o.   MRN: [id number]  HPI  Pt state here for follow up post covid infection.  She tested + at home 10-07-2021. Symptoms began on 09-01-2021.   On last visit with Dr. Etter Sjogren had   "She reports that she coughs up phlegm and has congestion that is mostly located in her chest with symptoms occasionally appearing in the nose. She is currently taking sinus and allergy OTC medications such as Zyrtec and DayQuil/NyQuil. She has a low-grade fever of about 100 F."  Pt cxr on 10-10-2021.   IMPRESSION: Cardiomegaly. There are no signs of pulmonary edema. Increased interstitial markings are seen in both lower lung fields suggesting possible interstitial pneumonia. There is possible minimal left pleural effusion.   " Dx with bronchitis due to covid. She has 4 tab of augmentin left and 2 tabs of prednisone left." Also took zpack.  Pt was never given any antiviral.  Will get rare occasional wheeze if she gets coughs. Pt  has not inhaler left.    Review of Systems  Constitutional:  Negative for chills, fatigue and fever.  HENT:  Negative for congestion, ear pain, rhinorrhea, sinus pressure and sinus pain.   Respiratory:  Negative for cough, chest tightness and shortness of breath.   Cardiovascular:  Negative for chest pain and palpitations.  Gastrointestinal:  Negative for abdominal pain.  Genitourinary:  Negative for difficulty urinating, dysuria, urgency and vaginal bleeding.  Musculoskeletal:  Negative for back pain and joint swelling.    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   anticoagulated on coumadin   GAD (generalized anxiety disorder)    Heart murmur    Hemorrhoids    History of avascular necrosis of  capital femoral epiphysis 1999   s/p THA right   History of basal cell carcinoma (BCC) excision    scalp/ neck   History of Clostridium difficile infection    History of DVT of lower extremity 1998;  04/ 2006   LLE   History of external beam radiation therapy 2010   right breast cancer   History of ITP    per pt hx long-term use prednisone for ITP,  1998 s/p total splenctomy   History of kidney stones    History of melanoma 1980s   s/p  right calf melanoma excision , per pt not malignant and localized ,  no recurrence   History of paroxysmal supraventricular tachycardia    (02-23-2020  pt stated have not had any issues / symptoms in several years   History of pulmonary embolus (PE) 04/2004   History of right breast cancer 2010   08-10-2008 s/p  right lumpectomy w/ sln bx (partial mastectomy) , ER+, Stage I,  completed radiation 2010,  per pt no recurrance   Hypertension    Macular degeneration, bilateral    mild   Mitral valve regurgitation 09/01/2016   cardiology-- dr Johnsie Cancel--- per note mild to moderate without stenosis,     Mixed hyperlipidemia 05/18/2015   Nocturia    OA (osteoarthritis)    lumbar stenosis, radiculopathy, OA- L hip   Personal history of colonic polyps    hyperplastic   Renal calculus, right    Right  ureteral calculus    Thoracic ascending aortic aneurysm ([place])    last CT angio chest in epic 11-29-2016  , 3.7cm     Social History   Socioeconomic History   Marital status: Married    Spouse name: Not on file   Number of children: 4   Years of education: Not on file   Highest education level: Not on file  Occupational History   Occupation: Retired  Tobacco Use   Smoking status: Never   Smokeless tobacco: Never  Vaping Use   Vaping Use: Never used  Substance and Sexual Activity   Alcohol use: No    Alcohol/week: 0.0 standard drinks of alcohol   Drug use: Never   Sexual activity: Not on file  Other Topics Concern   Not on file  Social History  Narrative   Married 1955   2 sons - '59, '61, 2 daughters '55, '57; 8 grandchildren; 1 great grand   I-ADLs      Social Determinants of Radio broadcast assistant Strain: Not on file  Food Insecurity: Not on file  Transportation Needs: Not on file  Physical Activity: Not on file  Stress: Not on file  Social Connections: Not on file  Intimate Partner Violence: Not on file    Past Surgical History:  Procedure Laterality Date   ABDOMINAL HYSTERECTOMY  1973   BREAST LUMPECTOMY WITH RADIOACTIVE SEED AND SENTINEL LYMPH NODE BIOPSY Right 08/10/2008   _0    CATARACT EXTRACTION W/ INTRAOCULAR LENS  IMPLANT, BILATERAL Bilateral 2001   CYSTOSCOPY WITH RETROGRADE PYELOGRAM, URETEROSCOPY AND STENT PLACEMENT Right 02/25/2020   Procedure: CYSTOSCOPY WITH RETROGRADE PYELOGRAM, URETEROSCOPY AND STENT EXCHANGE;  Surgeon: Alexis Frock, MD;  Location: [facility];  Service: Urology;  Laterality: Right;  75 MINS   CYSTOSCOPY WITH STENT PLACEMENT Right 01/27/2020   Procedure: CYSTOSCOPY WITH STENT PLACEMENT;  Surgeon: Alexis Frock, MD;  Location: [facility];  Service: Urology;  Laterality: Right;   CYSTOSCOPY/URETEROSCOPY/HOLMIUM LASER/STENT PLACEMENT Left 06-27-2018  dr Amalia Hailey,  _1    HOLMIUM LASER APPLICATION Right 78/29/5621   Procedure: HOLMIUM LASER APPLICATION;  Surgeon: Alexis Frock, MD;  Location: [facility];  Service: Urology;  Laterality: Right;   I & D EXTREMITY Right 10/26/2017   Procedure: IRRIGATION AND DEBRIDEMENT RIGHT HAND;  Surgeon: Iran Planas, MD;  Location: [place];  Service: Orthopedics;  Laterality: Right;   LUMBAR LAMINECTOMY/DECOMPRESSION MICRODISCECTOMY  04/11/2011   Procedure: LUMBAR LAMINECTOMY/DECOMPRESSION MICRODISCECTOMY;  Surgeon: [name], MD;  Location: [place] [facility];  Service: Neurosurgery;  Laterality: N/A;  Lumbar Five-Sacral One Microdiscectomy   MELANOMA EXCISION Right 1980s   right calf   SPLENECTOMY, TOTAL  1998   TOTAL  HIP ARTHROPLASTY Right 1999   UPPER GASTROINTESTINAL ENDOSCOPY      Family History  Problem Relation Age of Onset   Heart disease Mother    Heart disease Father    Alzheimer's disease Sister    Dementia Sister    Dementia Sister    Heart disease Other        uncles   Clotting disorder Maternal Uncle    Breast cancer Paternal 15    Cancer Paternal Aunt        breast   Stroke Sister    Dementia Sister        mean, carotid artery disease   Colon cancer Neg Hx    Anesthesia problems Neg Hx    Hypotension Neg Hx    Malignant hyperthermia Neg Hx  Pseudochol deficiency Neg Hx     Allergies  Allergen Reactions   Amlodipine Swelling    "my whole body swelled"   Carvedilol Swelling    "my whole body swelled"   Chlorthalidone     Patient reported extreme weakness.   Hydralazine Other (See Comments)    Joint and chest pain   Statins Other (See Comments)    Weakness, pain Lipitor and Pravachol Weakness, pain Lipitor and Pravachol   Zetia [Ezetimibe]     Unable to walk, severe leg pain   Cefuroxime Axetil     REACTION: Ddoes not remember reaction   Sulfonamide Derivatives Rash    REACTION: Rash    Current Outpatient Medications on File Prior to Visit  Medication Sig Dispense Refill   Accu-Chek Softclix Lancets lancets Use to check sugar daily or as needed. 100 each 1   amoxicillin-clavulanate (AUGMENTIN) 875-125 MG tablet Take 1 tablet by mouth 2 (two) times daily. 20 tablet 0   azithromycin (ZITHROMAX Z-PAK) 250 MG tablet As directed 6 each 0   B Complex Vitamins (VITAMIN B COMPLEX PO) Take 1 tablet by mouth daily.      blood glucose meter kit and supplies KIT Dispense based on patient and insurance preference. Use up to four times daily as directed. Dx: E11.9 1 each 0   Blood Pressure Monitoring (BLOOD PRESSURE KIT) DEVI 1 Device by Does not apply route daily as needed. Fill per patient insurance preference Dx: Hypertension 1 Device 0   Cranberry 1000 MG CAPS Take 1  capsule by mouth 2 (two) times daily.     fluconazole (DIFLUCAN) 150 MG tablet 1 po x1, may repeat in 3 days prn 2 tablet 0   fluconazole (DIFLUCAN) 150 MG tablet 1 po x1, may repeat in 3 days prn 2 tablet 0   furosemide (LASIX) 20 MG tablet Take 1 tablet (20 mg total) by mouth daily as needed for fluid or edema. 90 tablet 3   furosemide (LASIX) 40 MG tablet Take 1 tablet (40 mg total) by mouth daily. 30 tablet 3   glucose blood (ACCU-CHEK GUIDE) test strip USE TO TEST BLOOD SUGAR UP TO 4 TIMES DAILY AS INSTRUCTED 100 strip 5   nitrofurantoin (MACRODANTIN) 50 MG capsule Take 1 capsule (50 mg total) by mouth at bedtime. 30 capsule 2   nitrofurantoin, macrocrystal-monohydrate, (MACROBID) 100 MG capsule Take 1 capsule (100 mg total) by mouth 2 (two) times daily. 14 capsule 0   nitrofurantoin, macrocrystal-monohydrate, (MACROBID) 100 MG capsule Take 1 capsule (100 mg total) by mouth 2 (two) times daily. 14 capsule 0   potassium chloride SA (KLOR-CON M20) 20 MEQ tablet Take 1 tablet (20 mEq total) by mouth daily. 90 tablet 1   predniSONE (DELTASONE) 10 MG tablet TAKE 3 TABLETS PO QD FOR 3 DAYS THEN TAKE 2 TABLETS PO QD FOR 3 DAYS THEN TAKE 1 TABLET PO QD FOR 3 DAYS THEN TAKE 1/2 TAB PO QD FOR 3 DAYS 20 tablet 0   promethazine-dextromethorphan (PROMETHAZINE-DM) 6.25-15 MG/5ML syrup Take 5 mLs by mouth 4 (four) times daily as needed. 118 mL 0   valsartan (DIOVAN) 320 MG tablet Take 1 tablet (320 mg total) by mouth daily. 90 tablet 1   Vitamin D, Ergocalciferol, (DRISDOL) 1.25 MG (50000 UNIT) CAPS capsule Take 1 capsule (50,000 Units total) by mouth every 7 (seven) days. 12 capsule 0   warfarin (COUMADIN) 5 MG tablet PLEASE SEE ATTACHED FOR DETAILED DIRECTIONS 90 tablet 1   No current facility-administered medications  on file prior to visit.    BP (!) 159/80   Pulse 89   Temp 98 F (36.7 C)   Resp 20   Ht 5' 3" (1.6 m)   Wt 169 lb 6.4 oz (76.8 kg)   SpO2 97%   BMI 30.01 kg/m        Objective:    Physical Exam   General Mental Status- Alert. General Appearance- Not in acute distress.   Skin General: Color- Normal Color. Moisture- Normal Moisture.  Neck Carotid Arteries- Normal color. Moisture- Normal Moisture. No carotid bruits. No JVD.  Chest and Lung Exam Auscultation: Breath Sounds:-Normal.  Cardiovascular Auscultation:Rythm- Regular. Murmurs & Other Heart Sounds:Auscultation of the heart reveals- No Murmurs.  Abdomen Inspection:-Inspeection Normal. Palpation/Percussion:Note:No mass. Palpation and Percussion of the abdomen reveal- Non Tender, Non Distended + BS, no rebound or guarding.    Neurologic Cranial Nerve exam:- CN III-XII intact(No nystagmus), symmetric smile. Strength:- 5/5 equal and symmetric strength both upper and lower extremities.    Lower ext- no pedal edema. Negative homans sign.      Assessment & Plan:   Patient Instructions  Clinically much improved post covid. End of coarse augmentin and prednisone left.  For cough continue phernergan DM if needed.  If you get wheezing as you finish prednisone then use albuterol  If you cough becomes more productive or more wheezing then recommend repeat cxr. Not indicated presenlty. Lungs clear and 02 sat 97%.  Follow up as regularly scheduled with pcp or sooner if needed.     Mackie Pai, PA-C

## 2021-10-17 NOTE — Patient Instructions (Addendum)
Clinically much improved post covid. End of coarse  of augmentin and prednisone left(post covid bronchtis with wheezing)  For cough continue phenergan DM if needed.  If you get wheezing as you finish prednisone then use albuterol  If you cough becomes more productive or more wheezing then recommend repeat cxr. Not indicated presentlty. Lungs clear and 02 sat 97%.  Follow up as regularly scheduled with pcp or sooner if needed.

## 2021-10-18 ENCOUNTER — Ambulatory Visit: Payer: Medicare PPO

## 2021-10-18 DIAGNOSIS — Z7901 Long term (current) use of anticoagulants: Secondary | ICD-10-CM

## 2021-10-18 LAB — POCT INR: INR: 1.8 — AB (ref 2.0–3.0)

## 2021-10-18 NOTE — Patient Instructions (Signed)
Increase dose today to 1 tablet.  Then continue 1/2 tablet daily except take 1 tablet on Mondays, Wednesdays, and Fridays. Recheck in 2 weeks.

## 2021-10-18 NOTE — Progress Notes (Signed)
On 9/12 pt called to report that she has COVID. She was prescribed Augmentin x 10 days, prednisone x 12 days, promethazine-DM, and azithromycin.   Pt reports today that her last dose of prednisone was yesterday. The only rx she is still taking for COVID is Augmentin.   Increase dose today to 1 tablet.  Then continue 1/2 tablet daily except take 1 tablet on Mondays, Wednesdays, and Fridays. Recheck in 2 weeks.

## 2021-10-25 ENCOUNTER — Other Ambulatory Visit: Payer: Self-pay | Admitting: Family Medicine

## 2021-10-25 DIAGNOSIS — Z1231 Encounter for screening mammogram for malignant neoplasm of breast: Secondary | ICD-10-CM

## 2021-11-01 ENCOUNTER — Ambulatory Visit: Payer: Medicare PPO

## 2021-11-01 DIAGNOSIS — Z7901 Long term (current) use of anticoagulants: Secondary | ICD-10-CM | POA: Diagnosis not present

## 2021-11-01 LAB — POCT INR: INR: 3.3 — AB (ref 2.0–3.0)

## 2021-11-01 NOTE — Progress Notes (Signed)
Pt reports taking one Diflucan 150 mg tablet last week for yeast infection. Recently finished prednisone and amoxicillin for COVID pneumonia.  INR today 3.3  Hold dose today then continue 1/2 tablet daily except take 1 tablet on Mondays, Wednesdays, and Fridays. Recheck in 4 weeks per pt request.

## 2021-11-01 NOTE — Patient Instructions (Signed)
Hold dose today then continue 1/2 tablet daily except take 1 tablet on Mondays, Wednesdays, and Fridays. Recheck on 10/27.

## 2021-11-07 ENCOUNTER — Ambulatory Visit (INDEPENDENT_AMBULATORY_CARE_PROVIDER_SITE_OTHER): Payer: Medicare PPO

## 2021-11-07 ENCOUNTER — Encounter: Payer: Self-pay | Admitting: Family Medicine

## 2021-11-07 DIAGNOSIS — Z23 Encounter for immunization: Secondary | ICD-10-CM

## 2021-11-07 NOTE — Progress Notes (Signed)
Pt came in for the HD flu vaccine and tolerated well. Location: [facility]

## 2021-11-25 ENCOUNTER — Ambulatory Visit: Payer: Medicare PPO

## 2021-11-25 DIAGNOSIS — Z7901 Long term (current) use of anticoagulants: Secondary | ICD-10-CM

## 2021-11-25 LAB — POCT INR: INR: 2.3 (ref 2.0–3.0)

## 2021-11-25 NOTE — Progress Notes (Signed)
Continue 1/2 tablet daily except take 1 tablet on Mondays, Wednesdays, and Fridays. Recheck in 6 weeks per pt request.

## 2021-11-25 NOTE — Patient Instructions (Signed)
Continue 1/2 tablet daily except take 1 tablet on Mondays, Wednesdays, and Fridays. Recheck in 6 weeks, on 12/8 at 10:00 am.

## 2021-11-29 ENCOUNTER — Ambulatory Visit
Admission: RE | Admit: 2021-11-29 | Discharge: 2021-11-29 | Disposition: A | Discharge status: Home / Self Care | Payer: Medicare PPO | Source: Ambulatory Visit | Attending: Family Medicine | Admitting: Family Medicine

## 2021-11-29 DIAGNOSIS — Z1231 Encounter for screening mammogram for malignant neoplasm of breast: Secondary | ICD-10-CM | POA: Diagnosis not present

## 2021-12-08 DIAGNOSIS — H353131 Nonexudative age-related macular degeneration, bilateral, early dry stage: Secondary | ICD-10-CM | POA: Diagnosis not present

## 2021-12-08 DIAGNOSIS — Z961 Presence of intraocular lens: Secondary | ICD-10-CM | POA: Diagnosis not present

## 2021-12-08 DIAGNOSIS — H524 Presbyopia: Secondary | ICD-10-CM | POA: Diagnosis not present

## 2021-12-19 NOTE — Progress Notes (Signed)
Cardiology Office Note   Date:  12/28/2021   ID:  [name] [facility], [name] February 19, 1933, MRN [id number]  PCP:  [name] Held, DO  Cardiologist:   [name] Rouge, MD     History of Present Illness:  88 y.o. first seen August 2018 for murmur and mitral valve disease.   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 CRF;s HTN, elevated lipids and DM.  Intolerant to statins with lipitor and pravachol causing weakness. 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 Her first cousin is [name] who I care for as well Unfortunately  He is blind now from MD  Discussed taking her lasix a bit more for her postphlebitic edema   TTE updated 02/03/21 EF 60-65%   Having hard time handling husband who has advanced dementia and some paranoia    Past Medical History:  Diagnosis Date   Anticoagulated on Coumadin    long-term for factor V---  managed by coumadin clinic and pcp   Diet-controlled type 2 diabetes mellitus ([name])    followed by pcp   Factor V deficiency ([name]) 1998   anticoagulated on coumadin   GAD (generalized anxiety disorder)    Heart murmur    Hemorrhoids    History of avascular necrosis of capital femoral epiphysis 1999   s/p THA right   History of basal cell carcinoma (BCC) excision    scalp/ neck   History of Clostridium difficile infection    History of DVT of lower extremity 1998;  04/ 2006   LLE   History of external beam radiation therapy 2010   right breast cancer   History of ITP    per pt hx long-term use prednisone for ITP,  1998 s/p total splenctomy   History of kidney  stones    History of melanoma 1980s   s/p  right calf melanoma excision , per pt not malignant and localized ,  no recurrence   History of paroxysmal supraventricular tachycardia    (02-23-2020  pt stated have not had any issues / symptoms in several years   History of pulmonary embolus (PE) 04/2004   History of right breast cancer 2010   08-10-2008 s/p  right lumpectomy w/ sln bx (partial mastectomy) , ER+, Stage I,  completed radiation 2010,  per pt no recurrance   Hypertension    Macular degeneration, bilateral    mild   Mitral valve regurgitation 09/01/2016   cardiology-- dr Johnsie Cancel--- per note mild to moderate without stenosis,     Mixed hyperlipidemia 05/18/2015   Nocturia    OA (osteoarthritis)    lumbar stenosis, radiculopathy, OA- L hip   Personal history of colonic polyps    hyperplastic   Renal calculus, right    Right ureteral calculus    Thoracic ascending aortic aneurysm ([name])    last CT angio chest in epic 11-29-2016  , 3.7cm    Past Surgical History:  Procedure Laterality Date   ABDOMINAL HYSTERECTOMY  1973   BREAST LUMPECTOMY WITH RADIOACTIVE SEED AND SENTINEL LYMPH NODE BIOPSY Right  08/10/2008   _0    CATARACT EXTRACTION W/ INTRAOCULAR LENS  IMPLANT, BILATERAL Bilateral 2001   CYSTOSCOPY WITH RETROGRADE PYELOGRAM, URETEROSCOPY AND STENT PLACEMENT Right 02/25/2020   Procedure: CYSTOSCOPY WITH RETROGRADE PYELOGRAM, URETEROSCOPY AND STENT EXCHANGE;  Surgeon: [name] Frock, MD;  Location: [name];  Service: Urology;  Laterality: Right;  75 MINS   CYSTOSCOPY WITH STENT PLACEMENT Right 01/27/2020   Procedure: CYSTOSCOPY WITH STENT PLACEMENT;  Surgeon: [name] Frock, MD;  [facility];  Service: Urology;  Laterality: Right;   CYSTOSCOPY/URETEROSCOPY/HOLMIUM LASER/STENT PLACEMENT Left 06-27-2018  dr Amalia Hailey,  _1    HOLMIUM LASER APPLICATION Right 96/05/5407   Procedure: HOLMIUM LASER APPLICATION;  Surgeon: [name] Frock, MD;  Location: [facility];  Service: Urology;  Laterality: Right;   I & D EXTREMITY Right 10/26/2017   Procedure: IRRIGATION AND DEBRIDEMENT RIGHT HAND;  Surgeon: Iran Planas, MD;  Location: [place];  Service: Orthopedics;  Laterality: Right;   LUMBAR LAMINECTOMY/DECOMPRESSION MICRODISCECTOMY  04/11/2011   Procedure: LUMBAR LAMINECTOMY/DECOMPRESSION MICRODISCECTOMY;  Surgeon: [name], MD;  Location: [place] [facility];  Service: Neurosurgery;  Laterality: N/A;  Lumbar Five-Sacral One Microdiscectomy   MELANOMA EXCISION Right 1980s   right calf   SPLENECTOMY, TOTAL  1998   TOTAL HIP ARTHROPLASTY Right 1999   UPPER GASTROINTESTINAL ENDOSCOPY       Current Outpatient Medications  Medication Sig Dispense Refill   Accu-Chek Softclix Lancets lancets Use to check sugar daily or as needed. 100 each 1   B Complex Vitamins (VITAMIN B COMPLEX PO) Take 1 tablet by mouth daily.      blood glucose meter kit and supplies KIT Dispense based on patient and insurance preference. Use up to four times daily as directed. Dx: E11.9 1 each 0   Blood Pressure Monitoring (BLOOD PRESSURE KIT) DEVI 1 Device by Does not apply route daily as needed. Fill per patient insurance preference Dx: Hypertension 1 Device 0   Cranberry 1000 MG CAPS Take 1 capsule by mouth 2 (two) times daily.     Dietary Management Product (TOZAL PO) Take by mouth. Per patient taking everyday eye vitamin     furosemide (LASIX) 20 MG tablet Take 1 tablet (20 mg total) by mouth daily as needed for fluid or edema. 90 tablet 3   furosemide (LASIX) 40 MG tablet Take 1 tablet (40 mg total) by mouth daily. 30 tablet 3   glucose blood (ACCU-CHEK GUIDE) test strip USE TO TEST BLOOD SUGAR UP TO 4 TIMES DAILY AS INSTRUCTED 100 strip 5   POTASSIUM GLUCONATE PO Take by mouth. Per patient taking 650 mg tablet daily     valsartan (DIOVAN) 320 MG tablet Take 1 tablet (320 mg total) by mouth daily. 90 tablet 1   Vitamin D, Ergocalciferol, (DRISDOL) 1.25 MG (50000 UNIT)  CAPS capsule Take 1 capsule (50,000 Units total) by mouth every 7 (seven) days. 12 capsule 0   warfarin (COUMADIN) 5 MG tablet PLEASE SEE ATTACHED FOR DETAILED DIRECTIONS 90 tablet 1   No current facility-administered medications for this visit.    Allergies:   Amlodipine, Carvedilol, Chlorthalidone, Hydralazine, Statins, Zetia [ezetimibe], Cefuroxime axetil, and Sulfonamide derivatives    Social History:  The patient  reports that she has never smoked. She has never used smokeless tobacco. She reports that she does not drink alcohol and does not use drugs.   Family History:  The patient's family history includes Alzheimer's disease in her sister; Breast cancer in her paternal aunt; Cancer in her paternal  aunt; Clotting disorder in her maternal uncle; Dementia in her sister, sister, and sister; Heart disease in her father, mother, and another family member; Stroke in her sister.    ROS:  Please see the history of present illness.   Otherwise, review of systems are positive for none.   All other systems are reviewed and negative.    PHYSICAL EXAM: VS:  BP (!) 150/80   Pulse 80   Ht _0  (1.6 m)   Wt 169 lb 9.6 oz (76.9 kg)   SpO2 95%   BMI 30.04 kg/m  , BMI Body mass index is 30.04 kg/m. Affect appropriate Healthy:  appears stated age 23: normal Neck supple with no adenopathy JVP normal no bruits no thyromegaly Lungs clear with no wheezing and good diaphragmatic motion Heart:  S1/S2 soft apical MR murmur, no rub, gallop or click PMI normal Abdomen: benighn, BS positve, no tenderness, no AAA no bruit.  No HSM or HJR Distal pulses intact with no bruits Plus 2 RLE edema Plus one LLE chronic stasis changes  Neuro non-focal Skin warm and dry No muscular weakness    EKG:  12/28/2021 NSR rate 65 normal just LAD    Recent Labs: 03/16/2021: B Natriuretic Peptide 109.2 03/17/2021: Pro B Natriuretic peptide (BNP) 111.0 07/14/2021: ALT 11; BUN 22; Creatinine, Ser 0.83;  Hemoglobin 14.1; Platelets 324.0; Potassium 4.9; Sodium 142; TSH 3.13     Wt Readings from Last 3 Encounters:  12/28/21 169 lb 9.6 oz (76.9 kg)  10/17/21 169 lb 6.4 oz (76.8 kg)  10/10/21 175 lb 12.8 oz (79.7 kg)      Other studies Reviewed: Additional studies/ records that were reviewed today include: Notes Dr [name] Echo done July 2018 ECG's .    ASSESSMENT AND PLAN:  1. MR- mild by TTE done 02/03/21 observe  2. HTN improved continue current meds  3. DM:  Discussed low carb diet.  Target hemoglobin A1c is 6.5 or less.  Continue current medications. 4. Cholesterol continue welchol  intolerant to statins No vascular dx LDL 95 5. Hypercoagulability: on coumadin followed by primary INR 2.2 11/02/20  6. Anxiety:  Seems stable  7. Edema: dependant from venous disease cut out salt in chicken tenders PRN Lasix    Current medicines are reviewed at length with the patient today.  The patient does not have concerns regarding medicines.  The following changes have been made:  no change  Labs/ tests ordered today include: Echo for murmur/MR   No orders of the defined types were placed in this encounter.     Disposition:   FU with me in a year      Signed, [name] Rouge, MD  12/28/2021 10:39 AM    [facility] [address], [place], [place]  [address] Phone: [phone]; Fax: [phone]

## 2021-12-28 ENCOUNTER — Ambulatory Visit: Payer: Medicare PPO | Attending: Cardiovascular Disease | Admitting: Cardiovascular Disease

## 2021-12-28 ENCOUNTER — Encounter: Payer: Self-pay | Admitting: Cardiovascular Disease

## 2021-12-28 VITALS — BP 150/80 | HR 80 | Ht 63.0 in | Wt 169.6 lb

## 2021-12-28 DIAGNOSIS — R609 Edema, unspecified: Secondary | ICD-10-CM

## 2021-12-28 DIAGNOSIS — I340 Nonrheumatic mitral (valve) insufficiency: Secondary | ICD-10-CM | POA: Diagnosis not present

## 2021-12-28 DIAGNOSIS — I10 Essential (primary) hypertension: Secondary | ICD-10-CM | POA: Diagnosis not present

## 2021-12-28 NOTE — Patient Instructions (Addendum)
Medication Instructions:  Your physician recommends that you continue on your current medications as directed. Please refer to the Current Medication list given to you today.  *If you need a refill on your cardiac medications before your next appointment, please call your pharmacy*  Lab Work: If you have labs (blood work) drawn today and your tests are completely normal, you will receive your results only by: [place] (if you have MyChart) OR A paper copy in the mail If you have any lab test that is abnormal or we need to change your treatment, we will call you to review the results.  Testing/Procedures: None ordered today.  Follow-Up: At [facility], you and your health needs are our priority.  As part of our continuing mission to provide you with exceptional heart care, we have created designated Provider Care Teams.  These Care Teams include your primary Cardiologist (physician) and Advanced Practice Providers (APPs -  Physician Assistants and Nurse Practitioners) who all work together to provide you with the care you need, when you need it.  We recommend signing up for the patient portal called "MyChart".  Sign up information is provided on this After Visit Summary.  MyChart is used to connect with patients for Virtual Visits (Telemedicine).  Patients are able to view lab/test results, encounter notes, upcoming appointments, etc.  Non-urgent messages can be sent to your provider as well.   To learn more about what you can do with MyChart, go to [email or web address].    Your next appointment:   6 month  The format for your next appointment:   In Person  Provider:   Jenkins Rouge, MD      Important Information About Sugar

## 2022-01-05 DIAGNOSIS — C4359 Malignant melanoma of other part of trunk: Secondary | ICD-10-CM | POA: Diagnosis not present

## 2022-01-05 DIAGNOSIS — Z08 Encounter for follow-up examination after completed treatment for malignant neoplasm: Secondary | ICD-10-CM | POA: Diagnosis not present

## 2022-01-05 DIAGNOSIS — D225 Melanocytic nevi of trunk: Secondary | ICD-10-CM | POA: Diagnosis not present

## 2022-01-05 DIAGNOSIS — D485 Neoplasm of uncertain behavior of skin: Secondary | ICD-10-CM | POA: Diagnosis not present

## 2022-01-05 DIAGNOSIS — I872 Venous insufficiency (chronic) (peripheral): Secondary | ICD-10-CM | POA: Diagnosis not present

## 2022-01-05 DIAGNOSIS — L814 Other melanin hyperpigmentation: Secondary | ICD-10-CM | POA: Diagnosis not present

## 2022-01-05 DIAGNOSIS — L718 Other rosacea: Secondary | ICD-10-CM | POA: Diagnosis not present

## 2022-01-05 DIAGNOSIS — L821 Other seborrheic keratosis: Secondary | ICD-10-CM | POA: Diagnosis not present

## 2022-01-05 DIAGNOSIS — Z86006 Personal history of melanoma in-situ: Secondary | ICD-10-CM | POA: Diagnosis not present

## 2022-01-06 ENCOUNTER — Ambulatory Visit: Payer: Medicare PPO

## 2022-01-06 DIAGNOSIS — Z7901 Long term (current) use of anticoagulants: Secondary | ICD-10-CM | POA: Diagnosis not present

## 2022-01-06 LAB — POCT INR: INR: 2 (ref 2.0–3.0)

## 2022-01-06 NOTE — Patient Instructions (Addendum)
Pre visit review using our clinic review tool, if applicable. No additional management support is needed unless otherwise documented below in the visit note.  Continue 1/2 tablet daily except take 1 tablet on Mondays, Wednesdays, and Fridays. Recheck in 6 weeks. 

## 2022-01-06 NOTE — Progress Notes (Signed)
Continue 1/2 tablet daily except take 1 tablet on Mondays, Wednesdays, and Fridays. Recheck in 6 weeks. 

## 2022-02-06 ENCOUNTER — Telehealth: Payer: Self-pay | Admitting: Family Medicine

## 2022-02-06 ENCOUNTER — Other Ambulatory Visit: Payer: Self-pay | Admitting: Family Medicine

## 2022-02-06 DIAGNOSIS — Z7901 Long term (current) use of anticoagulants: Secondary | ICD-10-CM

## 2022-02-06 MED ORDER — WARFARIN SODIUM 5 MG PO TABS: ORAL_TABLET | ORAL | 1 refills | Status: DC

## 2022-02-06 NOTE — Telephone Encounter (Signed)
Prescription Request  02/06/2022  Is this a "Controlled Substance" medicine? No  LOV: 10/10/2021  What is the name of the medication or equipment?  warfarin (COUMADIN) 5 MG tablet  Have you contacted your pharmacy to request a refill? Yes   Which pharmacy would you like this sent to?  [id number]- [place], [place] - [address] [place]  [id number]: [phone]: [phone]   Patient notified that their request is being sent to the clinical staff for review and that they should receive a response within 2 business days.   Please advise at Mobile [phone](mobile)

## 2022-02-06 NOTE — Telephone Encounter (Signed)
Pt compliant with warfarin management and PCP apts. Sent in refill of warfarin.

## 2022-02-09 ENCOUNTER — Encounter: Payer: Self-pay | Admitting: Family Medicine

## 2022-02-09 ENCOUNTER — Ambulatory Visit: Payer: Medicare PPO | Admitting: Family Medicine

## 2022-02-09 VITALS — BP 120/86 | HR 96 | Temp 97.8°F | Resp 18 | Ht 63.0 in | Wt 172.6 lb

## 2022-02-09 DIAGNOSIS — E785 Hyperlipidemia, unspecified: Secondary | ICD-10-CM

## 2022-02-09 DIAGNOSIS — R319 Hematuria, unspecified: Secondary | ICD-10-CM

## 2022-02-09 DIAGNOSIS — E119 Type 2 diabetes mellitus without complications: Secondary | ICD-10-CM

## 2022-02-09 DIAGNOSIS — E1169 Type 2 diabetes mellitus with other specified complication: Secondary | ICD-10-CM | POA: Diagnosis not present

## 2022-02-09 DIAGNOSIS — G629 Polyneuropathy, unspecified: Secondary | ICD-10-CM

## 2022-02-09 DIAGNOSIS — R300 Dysuria: Secondary | ICD-10-CM

## 2022-02-09 DIAGNOSIS — N390 Urinary tract infection, site not specified: Secondary | ICD-10-CM

## 2022-02-09 DIAGNOSIS — R739 Hyperglycemia, unspecified: Secondary | ICD-10-CM

## 2022-02-09 DIAGNOSIS — I10 Essential (primary) hypertension: Secondary | ICD-10-CM

## 2022-02-09 DIAGNOSIS — E669 Obesity, unspecified: Secondary | ICD-10-CM

## 2022-02-09 LAB — MICROALBUMIN / CREATININE URINE RATIO
Creatinine,U: 267.8 mg/dL
Microalb Creat Ratio: 11 mg/g (ref 0.0–30.0)
Microalb, Ur: 29.5 mg/dL — ABNORMAL HIGH (ref 0.0–1.9)

## 2022-02-09 LAB — CBC WITH DIFFERENTIAL/PLATELET
Basophils Absolute: 0.1 10*3/uL (ref 0.0–0.1)
Basophils Relative: 0.7 % (ref 0.0–3.0)
Eosinophils Absolute: 0.1 10*3/uL (ref 0.0–0.7)
Eosinophils Relative: 1.1 % (ref 0.0–5.0)
HCT: 44.1 % (ref 36.0–46.0)
Hemoglobin: 14.5 g/dL (ref 12.0–15.0)
Lymphocytes Relative: 44.1 % (ref 12.0–46.0)
Lymphs Abs: 3.9 10*3/uL (ref 0.7–4.0)
MCHC: 33 g/dL (ref 30.0–36.0)
MCV: 92.7 fl (ref 78.0–100.0)
Monocytes Absolute: 0.9 10*3/uL (ref 0.1–1.0)
Monocytes Relative: 9.9 % (ref 3.0–12.0)
Neutro Abs: 3.9 10*3/uL (ref 1.4–7.7)
Neutrophils Relative %: 44.2 % (ref 43.0–77.0)
Platelets: 343 10*3/uL (ref 150.0–400.0)
RBC: 4.75 Mil/uL (ref 3.87–5.11)
RDW: 14.3 % (ref 11.5–15.5)
WBC: 8.8 10*3/uL (ref 4.0–10.5)

## 2022-02-09 LAB — POC URINALSYSI DIPSTICK (AUTOMATED)
Blood, UA: NEGATIVE
Glucose, UA: NEGATIVE
Nitrite, UA: POSITIVE
Protein, UA: POSITIVE — AB
Spec Grav, UA: >=1.03 — AB (ref 1.010–1.025)
Urobilinogen, UA: 0.2 E.U./dL
pH, UA: 6 (ref 5.0–8.0)

## 2022-02-09 LAB — COMPREHENSIVE METABOLIC PANEL
ALT: 12 U/L (ref 0–35)
AST: 16 U/L (ref 0–37)
Albumin: 4.3 g/dL (ref 3.5–5.2)
Alkaline Phosphatase: 68 U/L (ref 39–117)
BUN: 18 mg/dL (ref 6–23)
CO2: 30 mEq/L (ref 19–32)
Calcium: 9.7 mg/dL (ref 8.4–10.5)
Chloride: 106 mEq/L (ref 96–112)
Creatinine, Ser: 0.86 mg/dL (ref 0.40–1.20)
GFR: 61.28 mL/min (ref >60.00)
Glucose, Bld: 109 mg/dL — ABNORMAL HIGH (ref 70–99)
Potassium: 4.9 mEq/L (ref 3.5–5.1)
Sodium: 143 mEq/L (ref 135–145)
Total Bilirubin: 0.6 mg/dL (ref 0.2–1.2)
Total Protein: 7.3 g/dL (ref 6.0–8.3)

## 2022-02-09 LAB — LIPID PANEL
Cholesterol: 263 mg/dL — ABNORMAL HIGH (ref 0–200)
HDL: 39.5 mg/dL (ref >39.00)
NonHDL: 223.68
Total CHOL/HDL Ratio: 7
Triglycerides: 322 mg/dL — ABNORMAL HIGH (ref 0.0–149.0)
VLDL: 64.4 mg/dL — ABNORMAL HIGH (ref 0.0–40.0)

## 2022-02-09 LAB — VITAMIN B12: Vitamin B-12: 622 pg/mL (ref 211–911)

## 2022-02-09 LAB — TSH: TSH: 4.75 u[IU]/mL (ref 0.35–5.50)

## 2022-02-09 LAB — LDL CHOLESTEROL, DIRECT: Direct LDL: 144 mg/dL

## 2022-02-09 LAB — HEMOGLOBIN A1C: Hgb A1c MFr Bld: 6.6 % — ABNORMAL HIGH (ref 4.6–6.5)

## 2022-02-09 MED ORDER — NITROFURANTOIN MONOHYD MACRO 100 MG PO CAPS: 100.0000 mg | ORAL_CAPSULE | Freq: Two times a day (BID) | ORAL | 0 refills | Status: DC

## 2022-02-09 MED ORDER — PREGABALIN 25 MG PO CAPS: 25.0000 mg | ORAL_CAPSULE | Freq: Two times a day (BID) | ORAL | 2 refills | Status: DC

## 2022-02-09 NOTE — Patient Instructions (Signed)
Peripheral Neuropathy Peripheral neuropathy is a type of nerve damage. It affects nerves that carry signals between the spinal cord and the arms, legs, and the rest of the body (peripheral nerves). It does not affect nerves in the spinal cord or brain. In peripheral neuropathy, one nerve or a group of nerves may be damaged. Peripheral neuropathy is a broad category that includes many specific nerve disorders, like diabetic neuropathy, hereditary neuropathy, and carpal tunnel syndrome. What are the causes? This condition may be caused by: Certain diseases, such as: Diabetes. This is the most common cause of peripheral neuropathy. Autoimmune diseases, such as rheumatoid arthritis and systemic lupus erythematosus. Nerve diseases that are passed from parent to child (inherited). Kidney disease. Thyroid disease. Other causes may include: Nerve injury. Pressure or stress on a nerve that lasts a long time. Lack (deficiency) of B vitamins. This can result from alcoholism, poor diet, or a restricted diet. Infections. Some medicines, such as cancer medicines (chemotherapy). Poisonous (toxic) substances, such as lead and mercury. Too little blood flowing to the legs. In some cases, the cause of this condition is not known. What are the signs or symptoms? Symptoms of this condition depend on which of your nerves is damaged. Symptoms in the legs, hands, and arms can include: Loss of feeling (numbness) in the feet, hands, or both. Tingling in the feet, hands, or both. Burning pain. Very sensitive skin. Weakness. Not being able to move a part of the body (paralysis). Clumsiness or poor coordination. Muscle twitching. Loss of balance. Symptoms in other parts of the body can include: Not being able to control your bladder. Feeling dizzy. Sexual problems. How is this diagnosed? Diagnosing and finding the cause of peripheral neuropathy can be difficult. Your health care provider will take your  medical history and do a physical exam. A neurological exam will also be done. This involves checking things that are affected by your brain, spinal cord, and nerves (nervous system). For example, your health care provider will check your reflexes, how you move, and what you can feel. You may have other tests, such as: Blood tests. Electromyogram (EMG) and nerve conduction tests. These tests check nerve function and how well the nerves are controlling the muscles. Imaging tests, such as a CT scan or MRI, to rule out other causes of your symptoms. Removing a small piece of nerve to be examined in a lab (nerve biopsy). Removing and examining a small amount of the fluid that surrounds the brain and spinal cord (lumbar puncture). How is this treated? Treatment for this condition may involve: Treating the underlying cause of the neuropathy, such as diabetes, kidney disease, or vitamin deficiencies. Stopping medicines that can cause neuropathy, such as chemotherapy. Medicine to help relieve pain. Medicines may include: Prescription or over-the-counter pain medicine. Anti-seizure medicine. Antidepressants. Pain-relieving patches that are applied to painful areas of skin. Surgery to relieve pressure on a nerve or to destroy a nerve that is causing pain. Physical therapy to help improve movement and balance. Devices to help you move around (assistive devices). Follow these instructions at home: Medicines Take over-the-counter and prescription medicines only as told by your health care provider. Do not take any other medicines without first asking your health care provider. Ask your health care provider if the medicine prescribed to you requires you to avoid driving or using machinery. Lifestyle  Do not use any products that contain nicotine or tobacco. These products include cigarettes, chewing tobacco, and vaping devices, such as e-cigarettes. Smoking keeps   blood from reaching damaged nerves. If you  need help quitting, ask your health care provider. Avoid or limit alcohol. Too much alcohol can cause a vitamin B deficiency, and vitamin B is needed for healthy nerves. Eat a healthy diet. This includes: Eating foods that are high in fiber, such as beans, whole grains, and fresh fruits and vegetables. Limiting foods that are high in fat and processed sugars, such as fried or sweet foods. General instructions  If you have diabetes, work closely with your health care provider to keep your blood sugar under control. If you have numbness in your feet: Check every day for signs of injury or infection. Watch for redness, warmth, and swelling. Wear padded socks and comfortable shoes. These help protect your feet. Develop a good support system. Living with peripheral neuropathy can be stressful. Consider talking with a mental health specialist or joining a support group. Use assistive devices and attend physical therapy as told by your health care provider. This may include using a walker or a cane. Keep all follow-up visits. This is important. Where to find more information National Institute of Neurological Disorders: www.ninds.nih.gov Contact a health care provider if: You have new signs or symptoms of peripheral neuropathy. You are struggling emotionally from dealing with peripheral neuropathy. Your pain is not well controlled. Get help right away if: You have an injury or infection that is not healing normally. You develop new weakness in an arm or leg. You have fallen or do so frequently. Summary Peripheral neuropathy is when the nerves in the arms or legs are damaged, resulting in numbness, weakness, or pain. There are many causes of peripheral neuropathy, including diabetes, pinched nerves, vitamin deficiencies, autoimmune disease, and hereditary conditions. Diagnosing and finding the cause of peripheral neuropathy can be difficult. Your health care provider will take your medical  history, do a physical exam, and do tests, including blood tests and nerve function tests. Treatment involves treating the underlying cause of the neuropathy and taking medicines to help control pain. Physical therapy and assistive devices may also help. This information is not intended to replace advice given to you by your health care provider. Make sure you discuss any questions you have with your health care provider. Document Revised: 09/21/2020 Document Reviewed: 09/21/2020 Elsevier Patient Education  2023 Elsevier Inc.  

## 2022-02-09 NOTE — Assessment & Plan Note (Signed)
Tolerating statin, encouraged heart healthy diet, avoid trans fats, minimize simple carbs and saturated fats. Increase exercise as tolerated 

## 2022-02-09 NOTE — Assessment & Plan Note (Signed)
hgba1c to be checked minimize simple carbs. Increase exercise as tolerated. Continue current meds 

## 2022-02-09 NOTE — Progress Notes (Signed)
Subjective:   By signing my name below, I, [name], attest that this documentation has been prepared under the direction and in the presence of Ann Held, DO. 02/09/2022   Patient ID: [name] Little, female    DOB: 01-14-1934, 89 y.o.   MRN: [id number]  Chief Complaint  Patient presents with   Dysuria    X3 days, pt states having freq and burning.    Leg Pain    X1 month, pt states having numbness, burning, sensitive to touch, and occ sharp pain.     HPI Patient is in today for an office visit.  She complains of urinary burning, frequency, and mild abdominal pain around her bladder for the past 3 days. She denies having any back pain.  She also complains of leg pain at night. She stretches her legs up on her couch at night and feels burning and stinging pain on her heels going up to her shins. She also has numbness in her toes in both feet and poor balance throughout the day. She denies having any back pain. She feels occasional weakness in her legs while walking. She has difficulty sleeping while her legs are hurting.  Her blood sugar monitor is broken so she has not checked her blood sugar recently. She ordered another monitor which should be arriving soon.  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   anticoagulated on coumadin   GAD (generalized anxiety disorder)    Heart murmur    Hemorrhoids    History of avascular necrosis of capital femoral epiphysis 1999   s/p THA right   History of basal cell carcinoma (BCC) excision    scalp/ neck   History of Clostridium difficile infection    History of DVT of lower extremity 1998;  04/ 2006   LLE   History of external beam radiation therapy 2010    right breast cancer   History of ITP    per pt hx long-term use prednisone for ITP,  1998 s/p total splenctomy   History of kidney stones    History of melanoma 1980s   s/p  right calf melanoma excision , per pt not malignant and localized ,  no recurrence   History of paroxysmal supraventricular tachycardia    (02-23-2020  pt stated have not had any issues / symptoms in several years   History of pulmonary embolus (PE) 04/2004   History of right breast cancer 2010   08-10-2008 s/p  right lumpectomy w/ sln bx (partial mastectomy) , ER+, Stage I,  completed radiation 2010,  per pt no recurrance   Hypertension    Macular degeneration, bilateral    mild   Mitral valve regurgitation 09/01/2016   cardiology-- dr Johnsie Cancel--- per note mild to moderate without stenosis,     Mixed hyperlipidemia 05/18/2015   Nocturia    OA (osteoarthritis)    lumbar stenosis, radiculopathy, OA- L hip   Personal history of colonic polyps    hyperplastic   Renal calculus, right    Right ureteral calculus    Thoracic ascending aortic aneurysm ([place])    last CT angio chest in epic 11-29-2016  , 3.7cm  Past Surgical History:  Procedure Laterality Date   ABDOMINAL HYSTERECTOMY  1973   BREAST LUMPECTOMY WITH RADIOACTIVE SEED AND SENTINEL LYMPH NODE BIOPSY Right 08/10/2008   '@[facility]'$    CATARACT EXTRACTION W/ INTRAOCULAR LENS  IMPLANT, BILATERAL Bilateral 2001   CYSTOSCOPY WITH RETROGRADE PYELOGRAM, URETEROSCOPY AND STENT PLACEMENT Right 02/25/2020   Procedure: CYSTOSCOPY WITH RETROGRADE PYELOGRAM, URETEROSCOPY AND STENT EXCHANGE;  Surgeon: Alexis Frock, MD;  Location: [facility];  Service: Urology;  Laterality: Right;  75 MINS   CYSTOSCOPY WITH STENT PLACEMENT Right 01/27/2020   Procedure: CYSTOSCOPY WITH STENT PLACEMENT;  Surgeon: Alexis Frock, MD;  Location: [facility];  Service: Urology;  Laterality: Right;   CYSTOSCOPY/URETEROSCOPY/HOLMIUM LASER/STENT PLACEMENT Left 06-27-2018  dr Amalia Hailey,  '@[facility]'$     HOLMIUM LASER APPLICATION Right 86/57/8469   Procedure: HOLMIUM LASER APPLICATION;  Surgeon: Alexis Frock, MD;  Location: [facility];  Service: Urology;  Laterality: Right;   I & D EXTREMITY Right 10/26/2017   Procedure: IRRIGATION AND DEBRIDEMENT RIGHT HAND;  Surgeon: Iran Planas, MD;  Location: [place];  Service: Orthopedics;  Laterality: Right;   LUMBAR LAMINECTOMY/DECOMPRESSION MICRODISCECTOMY  04/11/2011   Procedure: LUMBAR LAMINECTOMY/DECOMPRESSION MICRODISCECTOMY;  Surgeon: [name], MD;  Location: [place] [facility];  Service: Neurosurgery;  Laterality: N/A;  Lumbar Five-Sacral One Microdiscectomy   MELANOMA EXCISION Right 1980s   right calf   SPLENECTOMY, TOTAL  1998   TOTAL HIP ARTHROPLASTY Right 1999   UPPER GASTROINTESTINAL ENDOSCOPY      Family History  Problem Relation Age of Onset   Heart disease Mother    Heart disease Father    Alzheimer's disease Sister    Dementia Sister    Dementia Sister    Heart disease Other        uncles   Clotting disorder Maternal Uncle    Breast cancer Paternal 69    Cancer Paternal Aunt        breast   Stroke Sister    Dementia Sister        mean, carotid artery disease   Colon cancer Neg Hx    Anesthesia problems Neg Hx    Hypotension Neg Hx    Malignant hyperthermia Neg Hx    Pseudochol deficiency Neg Hx     Social History   Socioeconomic History   Marital status: Married    Spouse name: Not on file   Number of children: 4   Years of education: Not on file   Highest education level: Not on file  Occupational History   Occupation: Retired  Tobacco Use   Smoking status: Never   Smokeless tobacco: Never  Vaping Use   Vaping Use: Never used  Substance and Sexual Activity   Alcohol use: No    Alcohol/week: 0.0 standard drinks of alcohol   Drug use: Never   Sexual activity: Not on file  Other Topics Concern   Not on file  Social History Narrative   Married 1955   2 sons - '59, '61, 2 daughters  '55, '57; 8 grandchildren; 1 great grand   I-ADLs      Social Determinants of Radio broadcast assistant Strain: Not on file  Food Insecurity: Not on file  Transportation Needs: Not on file  Physical Activity: Not on file  Stress: Not on file  Social Connections: Not on file  Intimate Partner Violence: Not on file    Outpatient Medications Prior to Visit  Medication Sig Dispense Refill   Accu-Chek Softclix Lancets lancets Use  to check sugar daily or as needed. 100 each 1   B Complex Vitamins (VITAMIN B COMPLEX PO) Take 1 tablet by mouth daily.      blood glucose meter kit and supplies KIT Dispense based on patient and insurance preference. Use up to four times daily as directed. Dx: E11.9 1 each 0   Blood Pressure Monitoring (BLOOD PRESSURE KIT) DEVI 1 Device by Does not apply route daily as needed. Fill per patient insurance preference Dx: Hypertension 1 Device 0   Cranberry 1000 MG CAPS Take 1 capsule by mouth 2 (two) times daily.     Dietary Management Product (TOZAL PO) Take by mouth. Per patient taking everyday eye vitamin     furosemide (LASIX) 20 MG tablet Take 1 tablet (20 mg total) by mouth daily as needed for fluid or edema. 90 tablet 3   furosemide (LASIX) 40 MG tablet Take 1 tablet (40 mg total) by mouth daily. 30 tablet 3   glucose blood (ACCU-CHEK GUIDE) test strip USE TO TEST BLOOD SUGAR UP TO 4 TIMES DAILY AS INSTRUCTED 100 strip 5   POTASSIUM GLUCONATE PO Take by mouth. Per patient taking 650 mg tablet daily     valsartan (DIOVAN) 320 MG tablet Take 1 tablet (320 mg total) by mouth daily. 90 tablet 1   Vitamin D, Ergocalciferol, (DRISDOL) 1.25 MG (50000 UNIT) CAPS capsule Take 1 capsule (50,000 Units total) by mouth every 7 (seven) days. 12 capsule 0   warfarin (COUMADIN) 5 MG tablet TAKE 1/2 TABLET BY MOUTH DAILY EXCEPT TAKE 1 TABLET ON MONDAYS, WEDNESDAYS AND FRIDAYS OR AS DIRECTED BY ANTICOAGULATION CLINIC 90 tablet 1   No facility-administered medications prior to  visit.    Allergies  Allergen Reactions   Amlodipine Swelling    "my whole body swelled"   Carvedilol Swelling    "my whole body swelled"   Chlorthalidone     Patient reported extreme weakness.   Hydralazine Other (See Comments)    Joint and chest pain   Statins Other (See Comments)    Weakness, pain Lipitor and Pravachol Weakness, pain Lipitor and Pravachol   Zetia [Ezetimibe]     Unable to walk, severe leg pain   Cefuroxime Axetil     REACTION: Ddoes not remember reaction   Sulfonamide Derivatives Rash    REACTION: Rash    Review of Systems  Constitutional:  Negative for fever and malaise/fatigue.  HENT:  Negative for congestion.   Eyes:  Negative for blurred vision.  Respiratory:  Negative for shortness of breath.   Cardiovascular:  Negative for chest pain, palpitations and leg swelling.  Gastrointestinal:  Positive for abdominal pain. Negative for blood in stool and nausea.  Genitourinary:  Positive for dysuria and frequency.  Musculoskeletal:  Negative for back pain and falls.       (+)burning and stinging pain on both shins and heels at night (+)poor balance on feet (+)occasional weakness in legs while walking  Skin:  Negative for rash.  Neurological:  Positive for tingling (toes on both feet). Negative for dizziness, loss of consciousness and headaches.  Endo/Heme/Allergies:  Negative for environmental allergies.  Psychiatric/Behavioral:  Negative for depression. The patient has insomnia (due to leg pain). The patient is not nervous/anxious.        Objective:    Physical Exam Vitals and nursing note reviewed.  Constitutional:      Appearance: Normal appearance.  HENT:     Head: Normocephalic and atraumatic.     Right Ear:  External ear normal.     Left Ear: External ear normal.  Eyes:     Extraocular Movements: Extraocular movements intact.     Pupils: Pupils are equal, round, and reactive to light.  Cardiovascular:     Rate and Rhythm: Normal rate and  regular rhythm.     Pulses: Normal pulses.     Heart sounds: Normal heart sounds. No murmur heard.    No gallop.  Pulmonary:     Effort: Pulmonary effort is normal. No respiratory distress.     Breath sounds: Normal breath sounds. No wheezing or rales.  Abdominal:     Tenderness: There is abdominal tenderness in the suprapubic area. There is no guarding or rebound.  Musculoskeletal:     Comments: 5/5 strength in lower extremities with pain  Skin:    General: Skin is warm and dry.  Neurological:     Mental Status: She is alert and oriented to person, place, and time.  Psychiatric:        Judgment: Judgment normal.     BP 120/86 (BP Location: Left Arm, Patient Position: Sitting, Cuff Size: Normal)   Pulse 96   Temp 97.8 F (36.6 C) (Oral)   Resp 18   Ht '5\' 3"'$  (1.6 m)   Wt 172 lb 9.6 oz (78.3 kg)   SpO2 98%   BMI 30.57 kg/m  Wt Readings from Last 3 Encounters:  02/09/22 172 lb 9.6 oz (78.3 kg)  12/28/21 169 lb 9.6 oz (76.9 kg)  10/17/21 169 lb 6.4 oz (76.8 kg)       Assessment & Plan:  Urinary tract infection with hematuria, site unspecified -     Nitrofurantoin Monohyd Macro; Take 1 capsule (100 mg total) by mouth 2 (two) times daily.  Dispense: 14 capsule; Refill: 0  Dysuria -     POCT Urinalysis Dipstick (Automated) -     Urine Culture  Neuropathy -     CBC with Differential/Platelet -     Comprehensive metabolic panel -     Lipid panel -     TSH -     Hemoglobin A1c -     Vitamin B12 -     Pregabalin; Take 1 capsule (25 mg total) by mouth 2 (two) times daily.  Dispense: 60 capsule; Refill: 2  Hyperglycemia -     CBC with Differential/Platelet -     Comprehensive metabolic panel -     Lipid panel -     TSH -     Hemoglobin A1c  Diabetes mellitus type 2 in obese [facility]) Assessment & Plan: hgba1c to be checked  minimize simple carbs. Increase exercise as tolerated. Continue current meds   Orders: -     CBC with Differential/Platelet -     Comprehensive  metabolic panel -     Lipid panel -     TSH -     Hemoglobin A1c -     Microalbumin / creatinine urine ratio  Hyperlipidemia associated with type 2 diabetes mellitus (HCC) -     CBC with Differential/Platelet -     Comprehensive metabolic panel -     Lipid panel -     TSH -     Hemoglobin A1c  Primary hypertension -     CBC with Differential/Platelet -     Comprehensive metabolic panel -     Lipid panel -     TSH -     Hemoglobin A1c  Urinary tract infection without hematuria,  site unspecified Assessment & Plan: Abx per orders  Culture pending    Hyperlipidemia, unspecified hyperlipidemia type Assessment & Plan: Tolerating statin, encouraged heart healthy diet, avoid trans fats, minimize simple carbs and saturated fats. Increase exercise as tolerated      I, Ann Held, DO, personally preformed the services described in this documentation.  All medical record entries made by the scribe were at my direction and in my presence.  I have reviewed the chart and discharge instructions (if applicable) and agree that the record reflects my personal performance and is accurate and complete. 02/09/2022   I,[name],acting as a scribe for Ann Held, DO.,have documented all relevant documentation on the behalf of Ann Held, DO,as directed by  Ann Held, DO while in the presence of Ann Held, DO.   Ann Held, DO

## 2022-02-09 NOTE — Assessment & Plan Note (Signed)
Abx per orders  Culture pending

## 2022-02-12 LAB — URINE CULTURE
MICRO NUMBER:: 14418667
SPECIMEN QUALITY:: ADEQUATE

## 2022-02-14 ENCOUNTER — Ambulatory Visit: Payer: Medicare PPO

## 2022-02-14 DIAGNOSIS — Z7901 Long term (current) use of anticoagulants: Secondary | ICD-10-CM | POA: Diagnosis not present

## 2022-02-14 LAB — POCT INR: INR: 1.6 — AB (ref 2.0–3.0)

## 2022-02-14 NOTE — Progress Notes (Signed)
Increase dose today to take 1 tablet and increase dose tomorrow to take 1 1/2 tablets and then continue 1/2 tablet daily except take 1 tablet on Mondays, Wednesdays, and Fridays. Recheck in 4 weeks.

## 2022-02-14 NOTE — Patient Instructions (Addendum)
Pre visit review using our clinic review tool, if applicable. No additional management support is needed unless otherwise documented below in the visit note.  Increase dose today to take 1 tablet and increase dose tomorrow to take 1 1/2 tablets and then continue 1/2 tablet daily except take 1 tablet on Mondays, Wednesdays, and Fridays. Recheck in 4 weeks.

## 2022-02-15 ENCOUNTER — Encounter: Payer: Self-pay | Admitting: Family Medicine

## 2022-02-15 ENCOUNTER — Telehealth: Payer: Self-pay | Admitting: Family Medicine

## 2022-02-15 ENCOUNTER — Other Ambulatory Visit: Payer: Self-pay | Admitting: Family Medicine

## 2022-02-15 DIAGNOSIS — N760 Acute vaginitis: Secondary | ICD-10-CM

## 2022-02-15 MED ORDER — ROSUVASTATIN CALCIUM 10 MG PO TABS: 10.0000 mg | ORAL_TABLET | Freq: Every day | ORAL | 0 refills | Status: DC

## 2022-02-15 MED ORDER — FLUCONAZOLE 150 MG PO TABS: 150.0000 mg | ORAL_TABLET | Freq: Every day | ORAL | 0 refills | Status: DC

## 2022-02-15 NOTE — Telephone Encounter (Signed)
Patient said she will try the the crestor and to go [facility]. Patient is out that way and would like to pick it up while she is out to avoid making a separate trip. Advised I would send message to [place] for her

## 2022-02-15 NOTE — Telephone Encounter (Signed)
Pt reports she was prescribed fluconazole for yeast infection. This has a major interaction with warfarin.  Last date pt used fluconazole, March 2023, INR was 4.5 and had to hold 2 doses.  Pt reports she took warfarin today. Advised to hold dose tomorrow (Thursday). only take 1/2 tablet on Friday, and hold dose on Saturday. Advised to restart normal dosing on Sunday. Advised if any s/s of bleeding or abnormal bruising to contact office or go to ER. Pt verbalized understanding and read-back instructions.

## 2022-02-15 NOTE — Telephone Encounter (Signed)
Pt changed her mind. Rx sent

## 2022-02-28 ENCOUNTER — Telehealth: Payer: Self-pay

## 2022-02-28 DIAGNOSIS — I700 Atherosclerosis of aorta: Secondary | ICD-10-CM

## 2022-02-28 DIAGNOSIS — E785 Hyperlipidemia, unspecified: Secondary | ICD-10-CM

## 2022-02-28 NOTE — Telephone Encounter (Signed)
-----  Message from Josue Hector, MD sent at 02/27/2022 12:20 PM EST ----- Given age and lack of vascular dx may Rx with diet Refer to lipid clinic ? Vascepa ? Is she taking crestor  ----- Message ----- From: Sanda Linger, CMA Sent: 02/27/2022  11:46 AM EST To: Josue Hector, MD   ----- Message ----- From: Ann Held, DO Sent: 02/25/2022  11:15 AM EST To: Sanda Linger, CMA  Forward to cardiology ----- Message ----- From: Sanda Linger, CMA Sent: 02/15/2022   3:49 PM EST To: Ann Held, DO  Spoke with patient. Pt states unable to take statins. Pt does  see Cardio already. Please advise

## 2022-02-28 NOTE — Telephone Encounter (Signed)
Patient aware of Dr. Kyla Balzarine recommendation. Will put in referral for lipid clinic. Patient is not taking her crestor.

## 2022-03-01 ENCOUNTER — Telehealth: Payer: Self-pay | Admitting: Cardiovascular Disease

## 2022-03-01 NOTE — Telephone Encounter (Signed)
Pt daughter calling back to [facility] from a vm she got. Informed her pt has already scheduled to see pharmD in March.  She also wanted to make Dr. Johnsie Cancel aware that pt is eating [employer] everyday. She states she has started going to pt's house twice a month to show her easy meals to fix to help with her heart health, however pt refuses. She states pt has not been putting any effort into eating healthier meals and wants to make pharmD and Dr. Johnsie Cancel aware before her appt.

## 2022-03-10 ENCOUNTER — Encounter: Payer: Self-pay | Admitting: Family Medicine

## 2022-03-14 ENCOUNTER — Other Ambulatory Visit: Payer: Self-pay | Admitting: Family Medicine

## 2022-03-14 ENCOUNTER — Ambulatory Visit: Payer: Medicare PPO

## 2022-03-14 DIAGNOSIS — Z7901 Long term (current) use of anticoagulants: Secondary | ICD-10-CM | POA: Diagnosis not present

## 2022-03-14 LAB — POCT INR: INR: 2.4 (ref 2.0–3.0)

## 2022-03-14 NOTE — Patient Instructions (Addendum)
Pre visit review using our clinic review tool, if applicable. No additional management support is needed unless otherwise documented below in the visit note.  Continue 1/2 tablet daily except take 1 tablet on Mondays, Wednesdays, and Fridays. Recheck in 6 weeks.

## 2022-03-14 NOTE — Progress Notes (Signed)
Continue 1/2 tablet daily except take 1 tablet on Mondays, Wednesdays, and Fridays. Recheck in 6 weeks.

## 2022-04-10 ENCOUNTER — Ambulatory Visit: Payer: Medicare PPO | Attending: Cardiovascular Disease | Admitting: Pharmacist

## 2022-04-10 ENCOUNTER — Ambulatory Visit: Payer: Medicare PPO | Admitting: Family Medicine

## 2022-04-10 DIAGNOSIS — Z789 Other specified health status: Secondary | ICD-10-CM | POA: Diagnosis not present

## 2022-04-10 DIAGNOSIS — E782 Mixed hyperlipidemia: Secondary | ICD-10-CM

## 2022-04-10 NOTE — Patient Instructions (Addendum)
Your LDL cholesterol is 144 and  your goal is < 100 Your triglycerides are 322 and your goal is < 150 Your HDL is 39.5 and your goal is > 50  Retry Crestor (rosuvastatin) at lower dose of '5mg'$  3 days a week. You can cut your '10mg'$  tablet in half. If you tolerate this for a few weeks, you can try increasing the frequency  Repatha is a twice monthly injection that can be a back up option - it lowers LDL cholesterol by about 60% but is more expensive - usually ~$40/1 month or ~$80/3 month supply  Call Jinny Blossom, PharmD with any tolerability issues

## 2022-04-10 NOTE — Progress Notes (Signed)
Patient ID: [name] Little                 DOB: 1933-05-11                    MRN: [id number]     HPI: [name] Little is a 89 y.o. female patient referred to lipid clinic by Dr Johnsie Cancel. PMH is significant for factor V leiden deficiency on warfarin, PE and recurrent DVT, HTN, HLD, DM2, osteoarthritis, murmur, and mitral valve disease. Pt prescribed rosuvastatin '10mg'$  daily by PCP on 02/12/22 however pt did not take due to prior statin intolerance. Her daughter sent in a message on 03/01/22 reporting some dietary indiscretion for pt.  Pt reports leg pain on atorvastatin, pravastatin, and ezetimibe. Reports she tried rosuvastatin '10mg'$  daily prescribed by her PCP for about a week but then stopped due to ankle and leg pain. A1c 6.6% on no meds. Granddaughter is a Engineer, maintenance (IT). Reviewed diet today - see below. Cares for her husband who has Alzheimer's, her daughters help as well.  Current Medications: none Intolerances: atorvastatin, pravastatin, rosuvastatin '10mg'$  daily - weakness and pain; ezetimibe - leg pain Risk Factors: family history of CAD, DM LDL goal: '100mg'$ /dL  Diet: Plain cheerios or eggs for breakfast. Lunch - PB+J on whole wheat bread. Dinner - green beans, salmon. Drinks water. Meets friends at [employer] every day - gets a coffee with 1 cream or a Coke. Doesn't eat there, eats at home beforehand. Makes her own tea - uses Splenda to sweeten. Doesn't like diet soda. No sweets or alcohol.  Exercise: active in her garden and mows the lawn, walks   Family History: Heart disease in her father, mother, and another family member; Stroke in her sister.    Social History: No tobacco, alcohol or drug use.  Labs: 02/09/22: TC 263, TG 322, HDL 39.5, direct LDL 144, nonHDL 223, A1c 6.6%  Past Medical History:  Diagnosis Date   Anticoagulated on Coumadin    long-term for factor V---  managed by coumadin clinic and pcp   Diet-controlled type 2 diabetes mellitus ([place])    followed by pcp    Factor V deficiency ([place]) 1998   anticoagulated on coumadin   GAD (generalized anxiety disorder)    Heart murmur    Hemorrhoids    History of avascular necrosis of capital femoral epiphysis 1999   s/p THA right   History of basal cell carcinoma (BCC) excision    scalp/ neck   History of Clostridium difficile infection    History of DVT of lower extremity 1998;  04/ 2006   LLE   History of external beam radiation therapy 2010   right breast cancer   History of ITP    per pt hx long-term use prednisone for ITP,  1998 s/p total splenctomy   History of kidney stones    History of melanoma 1980s   s/p  right calf melanoma excision , per pt not malignant and localized ,  no recurrence   History of paroxysmal supraventricular tachycardia    (02-23-2020  pt stated have not had any issues / symptoms in several years   History of pulmonary embolus (PE) 04/2004   History of right breast cancer 2010   08-10-2008 s/p  right lumpectomy w/ sln bx (partial mastectomy) , ER+, Stage I,  completed radiation 2010,  per pt no recurrance   Hypertension    Macular degeneration, bilateral    mild   Mitral valve regurgitation 09/01/2016  cardiology-- dr Johnsie Cancel--- per note mild to moderate without stenosis,     Mixed hyperlipidemia 05/18/2015   Nocturia    OA (osteoarthritis)    lumbar stenosis, radiculopathy, OA- L hip   Personal history of colonic polyps    hyperplastic   Renal calculus, right    Right ureteral calculus    Thoracic ascending aortic aneurysm ([place])    last CT angio chest in epic 11-29-2016  , 3.7cm    Current Outpatient Medications on File Prior to Visit  Medication Sig Dispense Refill   Accu-Chek Softclix Lancets lancets Use to check sugar daily or as needed. 100 each 1   B Complex Vitamins (VITAMIN B COMPLEX PO) Take 1 tablet by mouth daily.      blood glucose meter kit and supplies KIT Dispense based on patient and insurance preference. Use up to four times daily as  directed. Dx: E11.9 1 each 0   Blood Pressure Monitoring (BLOOD PRESSURE KIT) DEVI 1 Device by Does not apply route daily as needed. Fill per patient insurance preference Dx: Hypertension 1 Device 0   Cranberry 1000 MG CAPS Take 1 capsule by mouth 2 (two) times daily.     Dietary Management Product (TOZAL PO) Take by mouth. Per patient taking everyday eye vitamin     fluconazole (DIFLUCAN) 150 MG tablet Take 1 tablet (150 mg total) by mouth daily. Can repeat in 3 days as needed 2 tablet 0   furosemide (LASIX) 20 MG tablet Take 1 tablet (20 mg total) by mouth daily as needed for fluid or edema. 90 tablet 3   furosemide (LASIX) 40 MG tablet Take 1 tablet (40 mg total) by mouth daily. 30 tablet 3   glucose blood (ACCU-CHEK GUIDE) test strip USE TO TEST BLOOD SUGAR UP TO 4 TIMES DAILY AS INSTRUCTED 100 strip 5   nitrofurantoin, macrocrystal-monohydrate, (MACROBID) 100 MG capsule Take 1 capsule (100 mg total) by mouth 2 (two) times daily. 14 capsule 0   POTASSIUM GLUCONATE PO Take by mouth. Per patient taking 650 mg tablet daily     pregabalin (LYRICA) 25 MG capsule Take 1 capsule (25 mg total) by mouth 2 (two) times daily. 60 capsule 2   rosuvastatin (CRESTOR) 10 MG tablet Take 1 tablet (10 mg total) by mouth daily. 90 tablet 0   valsartan (DIOVAN) 320 MG tablet Take 1 tablet (320 mg total) by mouth daily. 90 tablet 1   Vitamin D, Ergocalciferol, (DRISDOL) 1.25 MG (50000 UNIT) CAPS capsule Take 1 capsule (50,000 Units total) by mouth every 7 (seven) days. 12 capsule 0   warfarin (COUMADIN) 5 MG tablet TAKE 1/2 TABLET BY MOUTH DAILY EXCEPT TAKE 1 TABLET ON MONDAYS, WEDNESDAYS AND FRIDAYS OR AS DIRECTED BY ANTICOAGULATION CLINIC 90 tablet 1   No current facility-administered medications on file prior to visit.    Allergies  Allergen Reactions   Amlodipine Swelling    "my whole body swelled"   Carvedilol Swelling    "my whole body swelled"   Chlorthalidone     Patient reported extreme weakness.    Hydralazine Other (See Comments)    Joint and chest pain   Statins Other (See Comments)    Weakness, pain Lipitor and Pravachol Weakness, pain Lipitor and Pravachol   Zetia [Ezetimibe]     Unable to walk, severe leg pain   Cefuroxime Axetil     REACTION: Ddoes not remember reaction   Sulfonamide Derivatives Rash    REACTION: Rash    Assessment/Plan:  1.  Hyperlipidemia - LDL 144 above goal < 100 for primary prevention, TG 322 above goal < 150. Pt intolerant to multiple statins and ezetimibe. She is willing to rechallenge with lower dose/frequency of rosuvastatin '5mg'$  3x per week. Discussed Repatha as backup option but cost is a bit higher and coverage may be a bit more difficult for primary prevention. Currently drinking regular Coke each day - she will stop which should help to improve her TG as will statin therapy. Will touch base with pt in a month to f/u with tolerability and schedule labs at that time if tolerating well. She was advised to call clinic with any tolerability issues before then.  Emmalea Treanor E. Owynn Mosqueda, PharmD, BCACP, [place]. [address] [place], [place] [address] Phone: [phone]; Fax: [phone] 04/10/2022 11:31 AM

## 2022-04-11 ENCOUNTER — Telehealth: Payer: Self-pay | Admitting: Pharmacist

## 2022-04-11 NOTE — Telephone Encounter (Signed)
Noted, will reach out to pt after she sees her PCP and see which medication she would like to try.

## 2022-04-11 NOTE — Telephone Encounter (Signed)
Patient called to inform, she could not tolerate Rosuvastatin 5 mg. She just took one today and she started having pain. She is unsure about starting Repatha. Has appointment with PCP next week wanted to discuss with her and will get back to [place] if she wanted to start the therapy. Also, reported her son-in law is on Zetia, he does ok on that  so she is willing to try Zetia. However, wanted to wait even for Zetia until she gets her  PCP's opinion on Repatha.

## 2022-04-24 ENCOUNTER — Encounter: Payer: Self-pay | Admitting: Family Medicine

## 2022-04-24 ENCOUNTER — Ambulatory Visit: Payer: Medicare PPO | Admitting: Family Medicine

## 2022-04-24 VITALS — BP 160/98 | HR 75 | Temp 97.8°F | Resp 20 | Ht 63.0 in | Wt 168.6 lb

## 2022-04-24 DIAGNOSIS — E1169 Type 2 diabetes mellitus with other specified complication: Secondary | ICD-10-CM | POA: Diagnosis not present

## 2022-04-24 DIAGNOSIS — R739 Hyperglycemia, unspecified: Secondary | ICD-10-CM

## 2022-04-24 DIAGNOSIS — R609 Edema, unspecified: Secondary | ICD-10-CM | POA: Diagnosis not present

## 2022-04-24 DIAGNOSIS — E785 Hyperlipidemia, unspecified: Secondary | ICD-10-CM

## 2022-04-24 DIAGNOSIS — E669 Obesity, unspecified: Secondary | ICD-10-CM

## 2022-04-24 DIAGNOSIS — I1A0 Resistant hypertension: Secondary | ICD-10-CM

## 2022-04-24 DIAGNOSIS — R319 Hematuria, unspecified: Secondary | ICD-10-CM

## 2022-04-24 DIAGNOSIS — I10 Essential (primary) hypertension: Secondary | ICD-10-CM

## 2022-04-24 DIAGNOSIS — N390 Urinary tract infection, site not specified: Secondary | ICD-10-CM

## 2022-04-24 LAB — CBC WITH DIFFERENTIAL/PLATELET
Basophils Absolute: 0.1 10*3/uL (ref 0.0–0.1)
Basophils Relative: 1 % (ref 0.0–3.0)
Eosinophils Absolute: 0.1 10*3/uL (ref 0.0–0.7)
Eosinophils Relative: 1.6 % (ref 0.0–5.0)
HCT: 43.7 % (ref 36.0–46.0)
Hemoglobin: 14.6 g/dL (ref 12.0–15.0)
Lymphocytes Relative: 49.5 % — ABNORMAL HIGH (ref 12.0–46.0)
Lymphs Abs: 4 10*3/uL (ref 0.7–4.0)
MCHC: 33.3 g/dL (ref 30.0–36.0)
MCV: 91.2 fl (ref 78.0–100.0)
Monocytes Absolute: 0.8 10*3/uL (ref 0.1–1.0)
Monocytes Relative: 9.9 % (ref 3.0–12.0)
Neutro Abs: 3 10*3/uL (ref 1.4–7.7)
Neutrophils Relative %: 38 % — ABNORMAL LOW (ref 43.0–77.0)
Platelets: 300 10*3/uL (ref 150.0–400.0)
RBC: 4.8 Mil/uL (ref 3.87–5.11)
RDW: 14.3 % (ref 11.5–15.5)
WBC: 8 10*3/uL (ref 4.0–10.5)

## 2022-04-24 LAB — COMPREHENSIVE METABOLIC PANEL
ALT: 10 U/L (ref 0–35)
AST: 16 U/L (ref 0–37)
Albumin: 4.2 g/dL (ref 3.5–5.2)
Alkaline Phosphatase: 72 U/L (ref 39–117)
BUN: 18 mg/dL (ref 6–23)
CO2: 28 mEq/L (ref 19–32)
Calcium: 9.4 mg/dL (ref 8.4–10.5)
Chloride: 106 mEq/L (ref 96–112)
Creatinine, Ser: 0.78 mg/dL (ref 0.40–1.20)
GFR: 68.8 mL/min (ref >60.00)
Glucose, Bld: 116 mg/dL — ABNORMAL HIGH (ref 70–99)
Potassium: 4.5 mEq/L (ref 3.5–5.1)
Sodium: 140 mEq/L (ref 135–145)
Total Bilirubin: 0.9 mg/dL (ref 0.2–1.2)
Total Protein: 7 g/dL (ref 6.0–8.3)

## 2022-04-24 LAB — LIPID PANEL
Cholesterol: 154 mg/dL (ref 0–200)
HDL: 34.4 mg/dL — ABNORMAL LOW (ref >39.00)
LDL Cholesterol: 86 mg/dL (ref 0–99)
NonHDL: 119.85
Total CHOL/HDL Ratio: 4
Triglycerides: 167 mg/dL — ABNORMAL HIGH (ref 0.0–149.0)
VLDL: 33.4 mg/dL (ref 0.0–40.0)

## 2022-04-24 LAB — MICROALBUMIN / CREATININE URINE RATIO
Creatinine,U: 79.2 mg/dL
Microalb Creat Ratio: 2.9 mg/g (ref 0.0–30.0)
Microalb, Ur: 2.3 mg/dL — ABNORMAL HIGH (ref 0.0–1.9)

## 2022-04-24 LAB — HEMOGLOBIN A1C: Hgb A1c MFr Bld: 6.7 % — ABNORMAL HIGH (ref 4.6–6.5)

## 2022-04-24 MED ORDER — NEBIVOLOL HCL 5 MG PO TABS: 5.0000 mg | ORAL_TABLET | Freq: Every day | ORAL | 3 refills | Status: DC

## 2022-04-24 MED ORDER — NONFORMULARY OR COMPOUNDED ITEM: 1 refills | Status: DC

## 2022-04-24 NOTE — Progress Notes (Addendum)
Subjective:   By signing my name below, I, Jamey Reas, attest that this documentation has been prepared under the direction and in the presence of Ann Held, DO. 04/24/2022   Patient ID: [name] Little, female    DOB: 10-Aug-1933, 89 y.o.   MRN: [id number]  Chief Complaint  Patient presents with   Hypertension   Follow-up    HPI Patient is in today for a follow-up appointment.   Blood pressure Her blood pressure is elevated during this visit. She checks her blood pressure at home and reports her readings being elevated. She is taking 320 mg valsartan daily PO to manage it.  BP Readings from Last 3 Encounters:  04/24/22 (!) 160/98  02/09/22 120/86  12/28/21 (!) 150/80   Blood sugar  She does not check her blood sugar regularly. Her last A1C reading was elevated during her last blood work.  Lab Results  Component Value Date   HGBA1C 6.6 (H) 02/09/2022   Joint Pain She complains of joint pain in her ankles. She reports that it may be associated with taking Crestor.  She previously took Crestor every three days. She has not taken any Crestor for the past  5 days, but still experiences ankle pain.   Pregabalin She is taking pregabalin for neuropathy, but does not find relief with it. She prefers to take a different medication to manage it.   Stress She is under increased stress and wakes up several times at night. She reports that she is still sleeping enough.    Past Medical History:  Diagnosis Date   Anticoagulated on Coumadin    long-term for factor V---  managed by coumadin clinic and pcp   Diet-controlled type 2 diabetes mellitus ([place])    followed by pcp   Factor V deficiency ([place]) 1998   anticoagulated on coumadin   GAD (generalized anxiety disorder)    Heart murmur    Hemorrhoids    History of avascular necrosis of capital femoral epiphysis 1999   s/p THA right   History of basal cell carcinoma (BCC) excision    scalp/ neck   History of  Clostridium difficile infection    History of DVT of lower extremity 1998;  04/ 2006   LLE   History of external beam radiation therapy 2010   right breast cancer   History of ITP    per pt hx long-term use prednisone for ITP,  1998 s/p total splenctomy   History of kidney stones    History of melanoma 1980s   s/p  right calf melanoma excision , per pt not malignant and localized ,  no recurrence   History of paroxysmal supraventricular tachycardia    (02-23-2020  pt stated have not had any issues / symptoms in several years   History of pulmonary embolus (PE) 04/2004   History of right breast cancer 2010   08-10-2008 s/p  right lumpectomy w/ sln bx (partial mastectomy) , ER+, Stage I,  completed radiation 2010,  per pt no recurrance   Hypertension    Macular degeneration, bilateral    mild   Mitral valve regurgitation 09/01/2016   cardiology-- dr Johnsie Cancel--- per note mild to moderate without stenosis,     Mixed hyperlipidemia 05/18/2015   Nocturia    OA (osteoarthritis)    lumbar stenosis, radiculopathy, OA- L hip   Personal history of colonic polyps    hyperplastic   Renal calculus, right    Right ureteral calculus  Thoracic ascending aortic aneurysm ([place])    last CT angio chest in epic 11-29-2016  , 3.7cm    Past Surgical History:  Procedure Laterality Date   ABDOMINAL HYSTERECTOMY  1973   BREAST LUMPECTOMY WITH RADIOACTIVE SEED AND SENTINEL LYMPH NODE BIOPSY Right 08/10/2008   @[facility]    CATARACT EXTRACTION W/ INTRAOCULAR LENS  IMPLANT, BILATERAL Bilateral 2001   CYSTOSCOPY WITH RETROGRADE PYELOGRAM, URETEROSCOPY AND STENT PLACEMENT Right 02/25/2020   Procedure: CYSTOSCOPY WITH RETROGRADE PYELOGRAM, URETEROSCOPY AND STENT EXCHANGE;  Surgeon: Alexis Frock, MD;  Location: [facility];  Service: Urology;  Laterality: Right;  75 MINS   CYSTOSCOPY WITH STENT PLACEMENT Right 01/27/2020   Procedure: CYSTOSCOPY WITH STENT PLACEMENT;  Surgeon: Alexis Frock, MD;   Location: [facility];  Service: Urology;  Laterality: Right;   CYSTOSCOPY/URETEROSCOPY/HOLMIUM LASER/STENT PLACEMENT Left 06-27-2018  dr Amalia Hailey,  @[facility]    HOLMIUM LASER APPLICATION Right [id number]   Procedure: HOLMIUM LASER APPLICATION;  Surgeon: Alexis Frock, MD;  Location[facility];  Service: Urology;  Laterality: Right;   I & D EXTREMITY Right 10/26/2017   Procedure: IRRIGATION AND DEBRIDEMENT RIGHT HAND;  Surgeon: Iran Planas, MD;  Location: [place];  Service: Orthopedics;  Laterality: Right;   LUMBAR LAMINECTOMY/DECOMPRESSION MICRODISCECTOMY  04/11/2011   Procedure: LUMBAR LAMINECTOMY/DECOMPRESSION MICRODISCECTOMY;  Surgeon: [name], MD;  Location: [facility];  Service: Neurosurgery;  Laterality: N/A;  Lumbar Five-Sacral One Microdiscectomy   MELANOMA EXCISION Right 1980s   right calf   SPLENECTOMY, TOTAL  1998   TOTAL HIP ARTHROPLASTY Right 1999   UPPER GASTROINTESTINAL ENDOSCOPY      Family History  Problem Relation Age of Onset   Heart disease Mother    Heart disease Father    Alzheimer's disease Sister    Dementia Sister    Dementia Sister    Heart disease Other        uncles   Clotting disorder Maternal Uncle    Breast cancer Paternal 62    Cancer Paternal Aunt        breast   Stroke Sister    Dementia Sister        mean, carotid artery disease   Colon cancer Neg Hx    Anesthesia problems Neg Hx    Hypotension Neg Hx    Malignant hyperthermia Neg Hx    Pseudochol deficiency Neg Hx     Social History   Socioeconomic History   Marital status: Married    Spouse name: Not on file   Number of children: 4   Years of education: Not on file   Highest education level: Not on file  Occupational History   Occupation: Retired  Tobacco Use   Smoking status: Never   Smokeless tobacco: Never  Vaping Use   Vaping Use: Never used  Substance and Sexual Activity   Alcohol use: No    Alcohol/week: 0.0 standard drinks of alcohol   Drug use: Never    Sexual activity: Not on file  Other Topics Concern   Not on file  Social History Narrative   Married 1955   2 sons - '59, '61, 2 daughters '55, '57; 8 grandchildren; 1 great grand   I-ADLs      Social Determinants of Radio broadcast assistant Strain: Not on file  Food Insecurity: Not on file  Transportation Needs: Not on file  Physical Activity: Not on file  Stress: Not on file  Social Connections: Not on file  Intimate Partner Violence: Not on  file    Outpatient Medications Prior to Visit  Medication Sig Dispense Refill   Accu-Chek Softclix Lancets lancets Use to check sugar daily or as needed. 100 each 1   B Complex Vitamins (VITAMIN B COMPLEX PO) Take 1 tablet by mouth daily.      blood glucose meter kit and supplies KIT Dispense based on patient and insurance preference. Use up to four times daily as directed. Dx: E11.9 1 each 0   Blood Pressure Monitoring (BLOOD PRESSURE KIT) DEVI 1 Device by Does not apply route daily as needed. Fill per patient insurance preference Dx: Hypertension 1 Device 0   Cranberry 1000 MG CAPS Take 1 capsule by mouth 2 (two) times daily.     Dietary Management Product (TOZAL PO) Take by mouth. Per patient taking everyday eye vitamin     furosemide (LASIX) 20 MG tablet Take 1 tablet (20 mg total) by mouth daily as needed for fluid or edema. 90 tablet 3   furosemide (LASIX) 40 MG tablet Take 1 tablet (40 mg total) by mouth daily. 30 tablet 3   glucose blood (ACCU-CHEK GUIDE) test strip USE TO TEST BLOOD SUGAR UP TO 4 TIMES DAILY AS INSTRUCTED 100 strip 5   POTASSIUM GLUCONATE PO Take by mouth. Per patient taking 650 mg tablet daily     valsartan (DIOVAN) 320 MG tablet Take 1 tablet (320 mg total) by mouth daily. 90 tablet 1   Vitamin D, Ergocalciferol, (DRISDOL) 1.25 MG (50000 UNIT) CAPS capsule Take 1 capsule (50,000 Units total) by mouth every 7 (seven) days. 12 capsule 0   warfarin (COUMADIN) 5 MG tablet TAKE 1/2 TABLET BY MOUTH DAILY EXCEPT TAKE  1 TABLET ON MONDAYS, WEDNESDAYS AND FRIDAYS OR AS DIRECTED BY ANTICOAGULATION CLINIC 90 tablet 1   fluconazole (DIFLUCAN) 150 MG tablet Take 1 tablet (150 mg total) by mouth daily. Can repeat in 3 days as needed 2 tablet 0   nitrofurantoin, macrocrystal-monohydrate, (MACROBID) 100 MG capsule Take 1 capsule (100 mg total) by mouth 2 (two) times daily. 14 capsule 0   pregabalin (LYRICA) 25 MG capsule Take 1 capsule (25 mg total) by mouth 2 (two) times daily. (Patient not taking: Reported on 04/24/2022) 60 capsule 2   No facility-administered medications prior to visit.    Allergies  Allergen Reactions   Amlodipine Swelling    "my whole body swelled"   Carvedilol Swelling    "my whole body swelled"   Chlorthalidone     Patient reported extreme weakness.   Hydralazine Other (See Comments)    Joint and chest pain   Statins Other (See Comments)    Weakness, pain Lipitor and Pravachol Weakness, pain Lipitor and Pravachol   Zetia [Ezetimibe]     Unable to walk, severe leg pain   Lyrica [Pregabalin] Other (See Comments)    Weakness and joint pain   Rosuvastatin     Muscle pain on 1 dose of 5mg    Cefuroxime Axetil     REACTION: Ddoes not remember reaction   Sulfonamide Derivatives Rash    REACTION: Rash    Review of Systems  Constitutional:  Negative for fever and malaise/fatigue.  HENT:  Negative for congestion.   Eyes:  Negative for blurred vision.  Respiratory:  Negative for shortness of breath.   Cardiovascular:  Negative for chest pain, palpitations and leg swelling.  Gastrointestinal:  Negative for abdominal pain, blood in stool and nausea.  Genitourinary:  Negative for dysuria and frequency.  Musculoskeletal:  Positive for joint  pain (in both ankles). Negative for falls.  Skin:  Negative for rash.  Neurological:  Negative for dizziness, loss of consciousness and headaches.  Endo/Heme/Allergies:  Negative for environmental allergies.  Psychiatric/Behavioral:  Negative for  depression. The patient is not nervous/anxious.        Objective:    Physical Exam Vitals and nursing note reviewed.  Constitutional:      General: She is not in acute distress.    Appearance: Normal appearance.  HENT:     Head: Normocephalic and atraumatic.     Right Ear: External ear normal.     Left Ear: External ear normal.  Eyes:     Extraocular Movements: Extraocular movements intact.     Pupils: Pupils are equal, round, and reactive to light.  Cardiovascular:     Rate and Rhythm: Normal rate and regular rhythm.     Heart sounds: Normal heart sounds. No murmur heard.    No gallop.  Pulmonary:     Effort: No respiratory distress.     Breath sounds: Normal breath sounds. No wheezing or rales.  Skin:    General: Skin is warm.     Comments: (+) statis dermatitis in both lower extremities  Neurological:     General: No focal deficit present.     Mental Status: She is alert and oriented to person, place, and time.  Psychiatric:        Judgment: Judgment normal.     BP (!) 160/98 (BP Location: Left Arm, Patient Position: Sitting, Cuff Size: Normal)   Pulse 75   Temp 97.8 F (36.6 C) (Oral)   Resp 20   Ht 5\' 3"  (1.6 m)   Wt 168 lb 9.6 oz (76.5 kg)   SpO2 95%   BMI 29.87 kg/m  Wt Readings from Last 3 Encounters:  04/24/22 168 lb 9.6 oz (76.5 kg)  02/09/22 172 lb 9.6 oz (78.3 kg)  12/28/21 169 lb 9.6 oz (76.9 kg)       Assessment & Plan:  Resistant hypertension -     Nebivolol HCl; Take 1 tablet (5 mg total) by mouth daily.  Dispense: 90 tablet; Refill: 3  Hyperglycemia  Urinary tract infection with hematuria, site unspecified -     POCT Urinalysis Dipstick (Automated)  Diabetes mellitus type 2 in obese (HCC) -     Hemoglobin A1c -     Microalbumin / creatinine urine ratio  Hyperlipidemia associated with type 2 diabetes mellitus (HCC) -     CBC with Differential/Platelet -     Comprehensive metabolic panel -     Lipid panel -     TSH  Edema,  unspecified type -     NONFORMULARY OR COMPOUNDED ITEM; Compression socks  20-30 mm/hg #1  as directed  Dispense: 1 each; Refill: 1  Essential hypertension Assessment & Plan: Poorly controlled will alter medications, encouraged DASH diet, minimize caffeine and obtain adequate sleep. Report concerning symptoms and follow up as directed and as needed  Add bystolic  F/u 2-3 weeks      I, Ann Held, DO, personally preformed the services described in this documentation.  All medical record entries made by the scribe were at my direction and in my presence.  I have reviewed the chart and discharge instructions (if applicable) and agree that the record reflects my personal performance and is accurate and complete. 04/24/2022  Ann Held, DO   I,Kennedy C Lynn,acting as a scribe for [employer],  DO.,have documented all relevant documentation on the behalf of Ann Held, DO,as directed by  Ann Held, DO while in the presence of Ann Held, DO.

## 2022-04-24 NOTE — Assessment & Plan Note (Signed)
Poorly controlled will alter medications, encouraged DASH diet, minimize caffeine and obtain adequate sleep. Report concerning symptoms and follow up as directed and as needed  Add bystolic  F/u 2-3 weeks

## 2022-04-24 NOTE — Patient Instructions (Signed)

## 2022-04-25 ENCOUNTER — Ambulatory Visit: Payer: Medicare PPO

## 2022-04-25 ENCOUNTER — Telehealth: Payer: Self-pay | Admitting: Pharmacist

## 2022-04-25 DIAGNOSIS — Z7901 Long term (current) use of anticoagulants: Secondary | ICD-10-CM

## 2022-04-25 LAB — TSH: TSH: 3.57 u[IU]/mL (ref 0.35–5.50)

## 2022-04-25 LAB — POCT INR: INR: 1.6 — AB (ref 2.0–3.0)

## 2022-04-25 NOTE — Patient Instructions (Addendum)
Pre visit review using our clinic review tool, if applicable. No additional management support is needed unless otherwise documented below in the visit note.  Increase dose today to take 1 tablet and increase dose tomorrow to take 1 1/2 tablets and then continue 1/2 tablet daily except take 1 tablet on Mondays, Wednesdays, and Fridays. Recheck in 3 weeks.

## 2022-04-25 NOTE — Progress Notes (Signed)
Pt started Crestor and nevibolol two weeks ago.   Increase dose today to take 1 tablet and increase dose tomorrow to take 1 1/2 tablets and then continue 1/2 tablet daily except take 1 tablet on Mondays, Wednesdays, and Fridays. Recheck in 3 weeks.

## 2022-04-25 NOTE — Telephone Encounter (Signed)
Lipids checked at PCP yesterday, TG notably improved to 167 (Down from 200-400 range previously). LDL 86 and now at goal < 100 despite being on no lipid lowering meds. Previously reportedly intolerant to single dose of rosuvastatin 5mg  as well as atorvastatin, pravastatin, and ezetimibe. LDL was previously 144 and TG 322 just 2 months ago. Did encourage pt to stop drinking regular Coke which she has done, also states she stopped eating fried food and has lost 5 lbs.  Pt states her PCP restarted her Crestor and she's cutting her tablets in half and taking 1/2 tab each day. Per fill records, most recent rx was for a 10mg  dose so she's taking 5mg  daily. She told me she had aches on a single dose of this when I tried prescribing it a few weeks ago, but seems to be doing fine now. Advised pt to continue meds and I have added rosuvastatin back to her med list.

## 2022-04-27 ENCOUNTER — Other Ambulatory Visit: Payer: Self-pay | Admitting: Family Medicine

## 2022-04-27 ENCOUNTER — Telehealth: Payer: Self-pay | Admitting: Family Medicine

## 2022-04-27 DIAGNOSIS — I10 Essential (primary) hypertension: Secondary | ICD-10-CM

## 2022-04-27 MED ORDER — NEBIVOLOL HCL 10 MG PO TABS: 10.0000 mg | ORAL_TABLET | Freq: Every day | ORAL | 3 refills | Status: DC

## 2022-04-27 NOTE — Telephone Encounter (Signed)
Spoke w/ Pt- informed of recommendations. Pt verbalized understanding.  

## 2022-04-27 NOTE — Telephone Encounter (Signed)
Please advise. Pt has appt on 04/15

## 2022-04-27 NOTE — Telephone Encounter (Signed)
Patient states she has been taking her medication but her blood pressures are still high (151/83 - 135/64). She would like to know if there are other medications she can take to lower it. Please advise.

## 2022-05-01 ENCOUNTER — Encounter: Payer: Self-pay | Admitting: Family Medicine

## 2022-05-01 ENCOUNTER — Other Ambulatory Visit: Payer: Self-pay | Admitting: Family Medicine

## 2022-05-01 DIAGNOSIS — G4700 Insomnia, unspecified: Secondary | ICD-10-CM

## 2022-05-01 MED ORDER — TRAZODONE HCL 50 MG PO TABS: 25.0000 mg | ORAL_TABLET | Freq: Every evening | ORAL | 3 refills | Status: DC | PRN

## 2022-05-10 ENCOUNTER — Telehealth: Payer: Self-pay | Admitting: Family Medicine

## 2022-05-10 NOTE — Telephone Encounter (Signed)
Contacted [name] Little to schedule their annual wellness visit. Patient declined to schedule AWV at this time.  Verlee Rossetti; Care Guide Ambulatory Clinical Support [place] [facility] Direct Dial: [phone]

## 2022-05-15 ENCOUNTER — Encounter: Payer: Self-pay | Admitting: Family Medicine

## 2022-05-15 ENCOUNTER — Ambulatory Visit: Payer: Medicare PPO | Admitting: Family Medicine

## 2022-05-15 VITALS — BP 180/80 | HR 67 | Temp 98.1°F | Resp 18 | Ht 63.0 in | Wt 165.8 lb

## 2022-05-15 DIAGNOSIS — I10 Essential (primary) hypertension: Secondary | ICD-10-CM | POA: Diagnosis not present

## 2022-05-15 MED ORDER — NEBIVOLOL HCL 10 MG PO TABS: 10.0000 mg | ORAL_TABLET | Freq: Every day | ORAL | 3 refills | Status: DC

## 2022-05-15 MED ORDER — VALSARTAN 320 MG PO TABS: 320.0000 mg | ORAL_TABLET | Freq: Every day | ORAL | 3 refills | Status: DC

## 2022-05-15 NOTE — Progress Notes (Signed)
Subjective:   By signing my name below, I, [name] Little, attest that this documentation has been prepared under the direction and in the presence of [name] Spates R, DO. 05/15/2022.   Patient ID: [name] Little, female    DOB: April 16, 1933, 89 y.o.   MRN: [id number]  Chief Complaint  Patient presents with   Hypertension   Follow-up    HPI Patient is in today for an office visit.  Hypertension:  In clinic today her blood pressure is initially elevated to 180/80. She has been compliant with her 10 mg nebivolol daily (currently takes two 5 mg tablets), and has also been taking valsartan 160 mg daily. However, she reports her home readings continue to be labile. Of note, she has been under stress lately at home. Previously she was on losartan and lisinopril, but she is unsure why they were discontinued. She had developed swelling with amlodipine. BP Readings from Last 3 Encounters:  05/15/22 (!) 180/80  04/24/22 (!) 160/98  02/09/22 120/86    Past Medical History:  Diagnosis Date   Anticoagulated on Coumadin    long-term for factor V---  managed by coumadin clinic and pcp   Diet-controlled type 2 diabetes mellitus    followed by pcp   Factor V deficiency 1998   anticoagulated on coumadin   GAD (generalized anxiety disorder)    Heart murmur    Hemorrhoids    History of avascular necrosis of capital femoral epiphysis 1999   s/p THA right   History of basal cell carcinoma (BCC) excision    scalp/ neck   History of Clostridium difficile infection    History of DVT of lower extremity 1998;  04/ 2006   LLE   History of external beam radiation therapy 2010   right breast cancer   History of ITP    per pt hx long-term use prednisone for ITP,  1998 s/p total splenctomy   History of kidney stones    History of melanoma 1980s   s/p  right calf melanoma excision , per pt not malignant and localized ,  no recurrence   History of paroxysmal supraventricular tachycardia     (02-23-2020  pt stated have not had any issues / symptoms in several years   History of pulmonary embolus (PE) 04/2004   History of right breast cancer 2010   08-10-2008 s/p  right lumpectomy w/ sln bx (partial mastectomy) , ER+, Stage I,  completed radiation 2010,  per pt no recurrance   Hypertension    Macular degeneration, bilateral    mild   Mitral valve regurgitation 09/01/2016   cardiology-- dr Eden Emms--- per note mild to moderate without stenosis,     Mixed hyperlipidemia 05/18/2015   Nocturia    OA (osteoarthritis)    lumbar stenosis, radiculopathy, OA- L hip   Personal history of colonic polyps    hyperplastic   Renal calculus, right    Right ureteral calculus    Thoracic ascending aortic aneurysm    last CT angio chest in epic 11-29-2016  , 3.7cm    Past Surgical History:  Procedure Laterality Date   ABDOMINAL HYSTERECTOMY  1973   BREAST LUMPECTOMY WITH RADIOACTIVE SEED AND SENTINEL LYMPH NODE BIOPSY Right 08/10/2008   @[facility]    CATARACT EXTRACTION W/ INTRAOCULAR LENS  IMPLANT, BILATERAL Bilateral 2001   CYSTOSCOPY WITH RETROGRADE PYELOGRAM, URETEROSCOPY AND STENT PLACEMENT Right 02/25/2020   Procedure: CYSTOSCOPY WITH RETROGRADE PYELOGRAM, URETEROSCOPY AND STENT EXCHANGE;  Surgeon: Sebastian Ache, MD;  Location:  [facility];  Service: Urology;  Laterality: Right;  75 MINS   CYSTOSCOPY WITH STENT PLACEMENT Right 01/27/2020   Procedure: CYSTOSCOPY WITH STENT PLACEMENT;  Surgeon: Sebastian Ache, MD;  Location: [facility];  Service: Urology;  Laterality: Right;   CYSTOSCOPY/URETEROSCOPY/HOLMIUM LASER/STENT PLACEMENT Left 06-27-2018  dr Logan Bores,  @[facility]    HOLMIUM LASER APPLICATION Right 02/25/2020   Procedure: HOLMIUM LASER APPLICATION;  Surgeon: Sebastian Ache, MD;  Location: [facility];  Service: Urology;  Laterality: Right;   I & D EXTREMITY Right 10/26/2017   Procedure: IRRIGATION AND DEBRIDEMENT RIGHT HAND;  Surgeon: Bradly Bienenstock, MD;  Location: [facility];  Service: Orthopedics;  Laterality: Right;   LUMBAR LAMINECTOMY/DECOMPRESSION MICRODISCECTOMY  04/11/2011   Procedure: LUMBAR LAMINECTOMY/DECOMPRESSION MICRODISCECTOMY;  Surgeon: Barnett Abu, MD;  Location: [facility];  Service: Neurosurgery;  Laterality: N/A;  Lumbar Five-Sacral One Microdiscectomy   MELANOMA EXCISION Right 1980s   right calf   SPLENECTOMY, TOTAL  1998   TOTAL HIP ARTHROPLASTY Right 1999   UPPER GASTROINTESTINAL ENDOSCOPY      Family History  Problem Relation Age of Onset   Heart disease Mother    Heart disease Father    Alzheimer's disease Sister    Dementia Sister    Dementia Sister    Heart disease Other        uncles   Clotting disorder Maternal Uncle    Breast cancer Paternal Aunt    Cancer Paternal Aunt        breast   Stroke Sister    Dementia Sister        mean, carotid artery disease   Colon cancer Neg Hx    Anesthesia problems Neg Hx    Hypotension Neg Hx    Malignant hyperthermia Neg Hx    Pseudochol deficiency Neg Hx     Social History   Socioeconomic History   Marital status: Married    Spouse name: Not on file   Number of children: 4   Years of education: Not on file   Highest education level: Not on file  Occupational History   Occupation: Retired  Tobacco Use   Smoking status: Never   Smokeless tobacco: Never  Vaping Use   Vaping Use: Never used  Substance and Sexual Activity   Alcohol use: No    Alcohol/week: 0.0 standard drinks of alcohol   Drug use: Never   Sexual activity: Not on file  Other Topics Concern   Not on file  Social History Narrative   Married 1955   2 sons - '59, '61, 2 daughters '55, '57; 8 grandchildren; 1 great grand   I-ADLs      Social Determinants of Corporate investment banker Strain: Not on file  Food Insecurity: Not on file  Transportation Needs: Not on file  Physical Activity: Not on file  Stress: Not on file  Social Connections: Not on file  Intimate Partner Violence: Not on file     Outpatient Medications Prior to Visit  Medication Sig Dispense Refill   Accu-Chek Softclix Lancets lancets Use to check sugar daily or as needed. 100 each 1   B Complex Vitamins (VITAMIN B COMPLEX PO) Take 1 tablet by mouth daily.      blood glucose meter kit and supplies KIT Dispense based on patient and insurance preference. Use up to four times daily as directed. Dx: E11.9 1 each 0   Blood Pressure Monitoring (BLOOD PRESSURE KIT) DEVI 1 Device by Does not apply  route daily as needed. Fill per patient insurance preference Dx: Hypertension 1 Device 0   Cranberry 1000 MG CAPS Take 1 capsule by mouth 2 (two) times daily.     Dietary Management Product (TOZAL PO) Take by mouth. Per patient taking everyday eye vitamin     furosemide (LASIX) 20 MG tablet Take 1 tablet (20 mg total) by mouth daily as needed for fluid or edema. 90 tablet 3   furosemide (LASIX) 40 MG tablet Take 1 tablet (40 mg total) by mouth daily. 30 tablet 3   glucose blood (ACCU-CHEK GUIDE) test strip USE TO TEST BLOOD SUGAR UP TO 4 TIMES DAILY AS INSTRUCTED 100 strip 5   NONFORMULARY OR COMPOUNDED ITEM Compression socks  20-30 mm/hg #1  as directed 1 each 1   POTASSIUM GLUCONATE PO Take by mouth. Per patient taking 650 mg tablet daily     traZODone (DESYREL) 50 MG tablet Take 0.5-1 tablets (25-50 mg total) by mouth at bedtime as needed for sleep. 30 tablet 3   Vitamin D, Ergocalciferol, (DRISDOL) 1.25 MG (50000 UNIT) CAPS capsule Take 1 capsule (50,000 Units total) by mouth every 7 (seven) days. 12 capsule 0   warfarin (COUMADIN) 5 MG tablet TAKE 1/2 TABLET BY MOUTH DAILY EXCEPT TAKE 1 TABLET ON MONDAYS, WEDNESDAYS AND FRIDAYS OR AS DIRECTED BY ANTICOAGULATION CLINIC 90 tablet 1   nebivolol (BYSTOLIC) 10 MG tablet Take 1 tablet (10 mg total) by mouth daily. 90 tablet 3   rosuvastatin (CRESTOR) 10 MG tablet Take 5 mg by mouth daily.     No facility-administered medications prior to visit.    Allergies  Allergen Reactions    Amlodipine Swelling    "my whole body swelled"   Carvedilol Swelling    "my whole body swelled"   Chlorthalidone     Patient reported extreme weakness.   Hydralazine Other (See Comments)    Joint and chest pain   Statins Other (See Comments)    Weakness, pain Lipitor and Pravachol Weakness, pain Lipitor and Pravachol   Zetia [Ezetimibe]     Unable to walk, severe leg pain   Lyrica [Pregabalin] Other (See Comments)    Weakness and joint pain   Cefuroxime Axetil     REACTION: Ddoes not remember reaction   Sulfonamide Derivatives Rash    REACTION: Rash    Review of Systems  Constitutional:  Negative for fever and malaise/fatigue.  HENT:  Negative for congestion.   Eyes:  Negative for blurred vision.  Respiratory:  Negative for shortness of breath.   Cardiovascular:  Negative for chest pain, palpitations and leg swelling.  Gastrointestinal:  Negative for abdominal pain, blood in stool and nausea.  Genitourinary:  Negative for dysuria and frequency.  Musculoskeletal:  Negative for falls.  Skin:  Negative for rash.  Neurological:  Negative for dizziness, loss of consciousness and headaches.  Endo/Heme/Allergies:  Negative for environmental allergies.  Psychiatric/Behavioral:  Negative for depression. The patient is not nervous/anxious.    See HPI.     Objective:    Physical Exam Vitals and nursing note reviewed.  Constitutional:      Appearance: Normal appearance. She is well-developed.  HENT:     Head: Normocephalic and atraumatic.     Right Ear: Tympanic membrane, ear canal and external ear normal.     Left Ear: Tympanic membrane, ear canal and external ear normal.  Eyes:     Extraocular Movements: Extraocular movements intact.     Conjunctiva/sclera: Conjunctivae normal.  Pupils: Pupils are equal, round, and reactive to light.  Neck:     Thyroid: No thyromegaly.     Vascular: No carotid bruit or JVD.  Cardiovascular:     Rate and Rhythm: Normal rate and  regular rhythm.     Heart sounds: Normal heart sounds. No murmur heard.    No gallop.  Pulmonary:     Effort: Pulmonary effort is normal. No respiratory distress.     Breath sounds: Normal breath sounds. No wheezing or rales.  Chest:     Chest wall: No tenderness.  Musculoskeletal:     Cervical back: Normal range of motion and neck supple.  Skin:    General: Skin is warm and dry.  Neurological:     General: No focal deficit present.     Mental Status: She is alert and oriented to person, place, and time.  Psychiatric:        Mood and Affect: Mood normal.        Behavior: Behavior normal.     BP (!) 180/80 (BP Location: Left Arm, Patient Position: Sitting, Cuff Size: Normal)   Pulse 67   Temp 98.1 F (36.7 C) (Oral)   Resp 18   Ht 5\' 3"  (1.6 m)   Wt 165 lb 12.8 oz (75.2 kg)   SpO2 94%   BMI 29.37 kg/m  Wt Readings from Last 3 Encounters:  05/15/22 165 lb 12.8 oz (75.2 kg)  04/24/22 168 lb 9.6 oz (76.5 kg)  02/09/22 172 lb 9.6 oz (78.3 kg)    Diabetic Foot Exam - Simple   No data filed    Lab Results  Component Value Date   WBC 8.0 04/24/2022   HGB 14.6 04/24/2022   HCT 43.7 04/24/2022   PLT 300.0 04/24/2022   GLUCOSE 116 (H) 04/24/2022   CHOL 154 04/24/2022   TRIG 167.0 (H) 04/24/2022   HDL 34.40 (L) 04/24/2022   LDLDIRECT 144.0 02/09/2022   LDLCALC 86 04/24/2022   ALT 10 04/24/2022   AST 16 04/24/2022   NA 140 04/24/2022   K 4.5 04/24/2022   CL 106 04/24/2022   CREATININE 0.78 04/24/2022   BUN 18 04/24/2022   CO2 28 04/24/2022   TSH 3.57 04/24/2022   INR 1.6 (A) 04/25/2022   HGBA1C 6.7 (H) 04/24/2022   MICROALBUR 2.3 (H) 04/24/2022    Lab Results  Component Value Date   TSH 3.57 04/24/2022   Lab Results  Component Value Date   WBC 8.0 04/24/2022   HGB 14.6 04/24/2022   HCT 43.7 04/24/2022   MCV 91.2 04/24/2022   PLT 300.0 04/24/2022   Lab Results  Component Value Date   NA 140 04/24/2022   K 4.5 04/24/2022   CHLORIDE 109 08/12/2013    CO2 28 04/24/2022   GLUCOSE 116 (H) 04/24/2022   BUN 18 04/24/2022   CREATININE 0.78 04/24/2022   BILITOT 0.9 04/24/2022   ALKPHOS 72 04/24/2022   AST 16 04/24/2022   ALT 10 04/24/2022   PROT 7.0 04/24/2022   ALBUMIN 4.2 04/24/2022   CALCIUM 9.4 04/24/2022   ANIONGAP 10 03/16/2021   GFR 68.80 04/24/2022   Lab Results  Component Value Date   CHOL 154 04/24/2022   Lab Results  Component Value Date   HDL 34.40 (L) 04/24/2022   Lab Results  Component Value Date   LDLCALC 86 04/24/2022   Lab Results  Component Value Date   TRIG 167.0 (H) 04/24/2022   Lab Results  Component Value Date  CHOLHDL 4 04/24/2022   Lab Results  Component Value Date   HGBA1C 6.7 (H) 04/24/2022       Assessment & Plan:   Problem List Items Addressed This Visit       Unprioritized   Essential hypertension    Poorly controlled will alter medications, encouraged DASH diet, minimize caffeine and obtain adequate sleep. Report concerning symptoms and follow up as directed and as needed  Inc diovan 320 mg daily Con't bystolic 10 mg daily      Relevant Medications   valsartan (DIOVAN) 320 MG tablet   nebivolol (BYSTOLIC) 10 MG tablet   Other Visit Diagnoses     Hypertension, unspecified type    -  Primary   Relevant Medications   valsartan (DIOVAN) 320 MG tablet   nebivolol (BYSTOLIC) 10 MG tablet        Meds ordered this encounter  Medications   valsartan (DIOVAN) 320 MG tablet    Sig: Take 1 tablet (320 mg total) by mouth daily.    Dispense:  90 tablet    Refill:  3   nebivolol (BYSTOLIC) 10 MG tablet    Sig: Take 1 tablet (10 mg total) by mouth daily.    Dispense:  90 tablet    Refill:  3    I, Donato Schultz, DO, personally preformed the services described in this documentation.  All medical record entries made by the scribe were at my direction and in my presence.  I have reviewed the chart and discharge instructions (if applicable) and agree that the record  reflects my personal performance and is accurate and complete. 05/15/2022.  I,Mathew Stumpf,acting as a Neurosurgeon for [employer], DO.,have documented all relevant documentation on the behalf of Donato Schultz, DO,as directed by  Donato Schultz, DO while in the presence of Donato Schultz, DO.   Donato Schultz, DO

## 2022-05-15 NOTE — Patient Instructions (Addendum)
Con't bystolic 10 mg daily Increase diovan to 320 mg daily Office visit in 4 weeks     DASH Eating Plan DASH stands for Dietary Approaches to Stop Hypertension. The DASH eating plan is a healthy eating plan that has been shown to: Reduce high blood pressure (hypertension). Reduce your risk for type 2 diabetes, heart disease, and stroke. Help with weight loss. What are tips for following this plan? Reading food labels Check food labels for the amount of salt (sodium) per serving. Choose foods with less than 5 percent of the Daily Value of sodium. Generally, foods with less than 300 milligrams (mg) of sodium per serving fit into this eating plan. To find whole grains, look for the word "whole" as the first word in the ingredient list. Shopping Buy products labeled as "low-sodium" or "no salt added." Buy fresh foods. Avoid canned foods and pre-made or frozen meals. Cooking Avoid adding salt when cooking. Use salt-free seasonings or herbs instead of table salt or sea salt. Check with your health care provider or pharmacist before using salt substitutes. Do not fry foods. Cook foods using healthy methods such as baking, boiling, grilling, roasting, and broiling instead. Cook with heart-healthy oils, such as olive, canola, avocado, soybean, or sunflower oil. Meal planning  Eat a balanced diet that includes: 4 or more servings of fruits and 4 or more servings of vegetables each day. Try to fill one-half of your plate with fruits and vegetables. 6-8 servings of whole grains each day. Less than 6 oz (170 g) of lean meat, poultry, or fish each day. A 3-oz (85-g) serving of meat is about the same size as a deck of cards. One egg equals 1 oz (28 g). 2-3 servings of low-fat dairy each day. One serving is 1 cup (237 mL). 1 serving of nuts, seeds, or beans 5 times each week. 2-3 servings of heart-healthy fats. Healthy fats called omega-3 fatty acids are found in foods such as walnuts, flaxseeds,  fortified milks, and eggs. These fats are also found in cold-water fish, such as sardines, salmon, and mackerel. Limit how much you eat of: Canned or prepackaged foods. Food that is high in trans fat, such as some fried foods. Food that is high in saturated fat, such as fatty meat. Desserts and other sweets, sugary drinks, and other foods with added sugar. Full-fat dairy products. Do not salt foods before eating. Do not eat more than 4 egg yolks a week. Try to eat at least 2 vegetarian meals a week. Eat more home-cooked food and less restaurant, buffet, and fast food. Lifestyle When eating at a restaurant, ask that your food be prepared with less salt or no salt, if possible. If you drink alcohol: Limit how much you use to: 0-1 drink a day for women who are not pregnant. 0-2 drinks a day for men. Be aware of how much alcohol is in your drink. In the [place] one drink equals one 12 oz bottle of beer (355 mL), one 5 oz glass of wine (148 mL), or one 1 oz glass of hard liquor (44 mL). General information Avoid eating more than 2,300 mg of salt a day. If you have hypertension, you may need to reduce your sodium intake to 1,500 mg a day. Work with your health care provider to maintain a healthy body weight or to lose weight. Ask what an ideal weight is for you. Get at least 30 minutes of exercise that causes your heart to beat faster (aerobic exercise) most  days of the week. Activities may include walking, swimming, or biking. Work with your health care provider or dietitian to adjust your eating plan to your individual calorie needs. What foods should I eat? Fruits All fresh, dried, or frozen fruit. Canned fruit in natural juice (without added sugar). Vegetables Fresh or frozen vegetables (raw, steamed, roasted, or grilled). Low-sodium or reduced-sodium tomato and vegetable juice. Low-sodium or reduced-sodium tomato sauce and tomato paste. Low-sodium or reduced-sodium canned  vegetables. Grains Whole-grain or whole-wheat bread. Whole-grain or whole-wheat pasta. Brown rice. [place]. Bulgur. Whole-grain and low-sodium cereals. Pita bread. Low-fat, low-sodium crackers. Whole-wheat flour tortillas. Meats and other proteins Skinless chicken or [place]. Ground chicken or [place]. Pork with fat trimmed off. Fish and seafood. Egg whites. Dried beans, peas, or lentils. Unsalted nuts, nut butters, and seeds. Unsalted canned beans. Lean cuts of beef with fat trimmed off. Low-sodium, lean precooked or cured meat, such as sausages or meat loaves. Dairy Low-fat (1%) or fat-free (skim) milk. Reduced-fat, low-fat, or fat-free cheeses. Nonfat, low-sodium ricotta or cottage cheese. Low-fat or nonfat yogurt. Low-fat, low-sodium cheese. Fats and oils Soft margarine without trans fats. Vegetable oil. Reduced-fat, low-fat, or light mayonnaise and salad dressings (reduced-sodium). Canola, safflower, olive, avocado, soybean, and sunflower oils. Avocado. Seasonings and condiments Herbs. Spices. Seasoning mixes without salt. Other foods Unsalted popcorn and pretzels. Fat-free sweets. The items listed above may not be a complete list of foods and beverages you can eat. Contact a dietitian for more information. What foods should I avoid? Fruits Canned fruit in a light or heavy syrup. Fried fruit. Fruit in cream or butter sauce. Vegetables Creamed or fried vegetables. Vegetables in a cheese sauce. Regular canned vegetables (not low-sodium or reduced-sodium). Regular canned tomato sauce and paste (not low-sodium or reduced-sodium). Regular tomato and vegetable juice (not low-sodium or reduced-sodium). Angie Fava. Olives. Grains Baked goods made with fat, such as croissants, muffins, or some breads. Dry pasta or rice meal packs. Meats and other proteins Fatty cuts of meat. Ribs. Fried meat. Berniece Salines. Bologna, salami, and other precooked or cured meats, such as sausages or meat loaves. Fat from  the back of a pig (fatback). Bratwurst. Salted nuts and seeds. Canned beans with added salt. Canned or smoked fish. Whole eggs or egg yolks. Chicken or [place] with skin. Dairy Whole or 2% milk, cream, and half-and-half. Whole or full-fat cream cheese. Whole-fat or sweetened yogurt. Full-fat cheese. Nondairy creamers. Whipped toppings. Processed cheese and cheese spreads. Fats and oils Butter. Stick margarine. Lard. Shortening. Ghee. Bacon fat. Tropical oils, such as coconut, palm kernel, or palm oil. Seasonings and condiments Onion salt, garlic salt, seasoned salt, table salt, and sea salt. Worcestershire sauce. Tartar sauce. Barbecue sauce. Teriyaki sauce. Soy sauce, including reduced-sodium. Steak sauce. Canned and packaged gravies. Fish sauce. Oyster sauce. Cocktail sauce. Store-bought horseradish. Ketchup. Mustard. Meat flavorings and tenderizers. Bouillon cubes. Hot sauces. Pre-made or packaged marinades. Pre-made or packaged taco seasonings. Relishes. Regular salad dressings. Other foods Salted popcorn and pretzels. The items listed above may not be a complete list of foods and beverages you should avoid. Contact a dietitian for more information. Where to find more information National Heart, Lung, and Blood Institute: https://wilson-eaton.com/ American Heart Association: www.heart.org Academy of Nutrition and Dietetics: www.eatright.[place]: www.kidney.org Summary The DASH eating plan is a healthy eating plan that has been shown to reduce high blood pressure (hypertension). It may also reduce your risk for type 2 diabetes, heart disease, and stroke. When on the DASH eating plan,  aim to eat more fresh fruits and vegetables, whole grains, lean proteins, low-fat dairy, and heart-healthy fats. With the DASH eating plan, you should limit salt (sodium) intake to 2,300 mg a day. If you have hypertension, you may need to reduce your sodium intake to 1,500 mg a day. Work with your  health care provider or dietitian to adjust your eating plan to your individual calorie needs. This information is not intended to replace advice given to you by your health care provider. Make sure you discuss any questions you have with your health care provider. Document Revised: 12/20/2018 Document Reviewed: 12/20/2018 Elsevier Patient Education  [place].

## 2022-05-15 NOTE — Assessment & Plan Note (Signed)
Poorly controlled will alter medications, encouraged DASH diet, minimize caffeine and obtain adequate sleep. Report concerning symptoms and follow up as directed and as needed  Inc diovan 320 mg daily Con't bystolic 10 mg daily

## 2022-05-16 ENCOUNTER — Ambulatory Visit: Payer: Medicare PPO

## 2022-05-16 DIAGNOSIS — Z7901 Long term (current) use of anticoagulants: Secondary | ICD-10-CM

## 2022-05-16 LAB — POCT INR: INR: 1.6 — AB (ref 2.0–3.0)

## 2022-05-16 MED ORDER — WARFARIN SODIUM 5 MG PO TABS: ORAL_TABLET | ORAL | 1 refills | Status: DC

## 2022-05-16 NOTE — Progress Notes (Signed)
Increase dose today to take 1 tablet and increase dose tomorrow to take 1 1/2 tablets and then continue 1 tablet daily except take 1/2 tablet on Tuesdays, Thursdays and Saturdays. Recheck in 2 weeks.

## 2022-05-16 NOTE — Patient Instructions (Addendum)
Pre visit review using our clinic review tool, if applicable. No additional management support is needed unless otherwise documented below in the visit note.  Increase dose today to take 1 tablet and increase dose tomorrow to take 1 1/2 tablets and then continue 1 tablet daily except take 1/2 tablet on Tuesdays, Thursdays and Saturdays. Recheck in 2 weeks.

## 2022-05-22 ENCOUNTER — Encounter: Payer: Self-pay | Admitting: Family Medicine

## 2022-05-22 ENCOUNTER — Other Ambulatory Visit: Payer: Self-pay | Admitting: Family Medicine

## 2022-05-30 ENCOUNTER — Ambulatory Visit: Payer: Medicare PPO

## 2022-05-30 DIAGNOSIS — Z7901 Long term (current) use of anticoagulants: Secondary | ICD-10-CM | POA: Diagnosis not present

## 2022-05-30 LAB — POCT INR: INR: 2.3 (ref 2.0–3.0)

## 2022-05-30 NOTE — Patient Instructions (Addendum)
Pre visit review using our clinic review tool, if applicable. No additional management support is needed unless otherwise documented below in the visit note.  Continue 1 tablet daily except take 1/2 tablet on Tuesdays, Thursdays and Saturdays. Recheck in 6 weeks. 

## 2022-05-30 NOTE — Progress Notes (Signed)
Continue 1 tablet daily except take 1/2 tablet on Tuesdays, Thursdays and Saturdays. Recheck in 6 weeks. 

## 2022-06-06 ENCOUNTER — Ambulatory Visit: Payer: Medicare PPO | Admitting: Family Medicine

## 2022-06-06 ENCOUNTER — Encounter: Payer: Self-pay | Admitting: Family Medicine

## 2022-06-06 VITALS — BP 140/90 | HR 66 | Temp 97.9°F | Resp 20 | Ht 63.0 in | Wt 163.8 lb

## 2022-06-06 DIAGNOSIS — I10 Essential (primary) hypertension: Secondary | ICD-10-CM | POA: Diagnosis not present

## 2022-06-06 MED ORDER — NEBIVOLOL HCL 20 MG PO TABS: 20.0000 mg | ORAL_TABLET | Freq: Every day | ORAL | 3 refills | Status: DC

## 2022-06-06 MED ORDER — SPIRONOLACTONE 25 MG PO TABS: 25.0000 mg | ORAL_TABLET | Freq: Every day | ORAL | 3 refills | Status: DC

## 2022-06-06 NOTE — Patient Instructions (Addendum)
Increase the bystolic to 20 mg a day    DASH Eating Plan DASH stands for Dietary Approaches to Stop Hypertension. The DASH eating plan is a healthy eating plan that has been shown to: Reduce high blood pressure (hypertension). Reduce your risk for type 2 diabetes, heart disease, and stroke. Help with weight loss. What are tips for following this plan? Reading food labels Check food labels for the amount of salt (sodium) per serving. Choose foods with less than 5 percent of the Daily Value of sodium. Generally, foods with less than 300 milligrams (mg) of sodium per serving fit into this eating plan. To find whole grains, look for the word "whole" as the first word in the ingredient list. Shopping Buy products labeled as "low-sodium" or "no salt added." Buy fresh foods. Avoid canned foods and pre-made or frozen meals. Cooking Avoid adding salt when cooking. Use salt-free seasonings or herbs instead of table salt or sea salt. Check with your health care provider or pharmacist before using salt substitutes. Do not fry foods. Cook foods using healthy methods such as baking, boiling, grilling, roasting, and broiling instead. Cook with heart-healthy oils, such as olive, canola, avocado, soybean, or sunflower oil. Meal planning  Eat a balanced diet that includes: 4 or more servings of fruits and 4 or more servings of vegetables each day. Try to fill one-half of your plate with fruits and vegetables. 6-8 servings of whole grains each day. Less than 6 oz (170 g) of lean meat, poultry, or fish each day. A 3-oz (85-g) serving of meat is about the same size as a deck of cards. One egg equals 1 oz (28 g). 2-3 servings of low-fat dairy each day. One serving is 1 cup (237 mL). 1 serving of nuts, seeds, or beans 5 times each week. 2-3 servings of heart-healthy fats. Healthy fats called omega-3 fatty acids are found in foods such as walnuts, flaxseeds, fortified milks, and eggs. These fats are also found in  cold-water fish, such as sardines, salmon, and mackerel. Limit how much you eat of: Canned or prepackaged foods. Food that is high in trans fat, such as some fried foods. Food that is high in saturated fat, such as fatty meat. Desserts and other sweets, sugary drinks, and other foods with added sugar. Full-fat dairy products. Do not salt foods before eating. Do not eat more than 4 egg yolks a week. Try to eat at least 2 vegetarian meals a week. Eat more home-cooked food and less restaurant, buffet, and fast food. Lifestyle When eating at a restaurant, ask that your food be prepared with less salt or no salt, if possible. If you drink alcohol: Limit how much you use to: 0-1 drink a day for women who are not pregnant. 0-2 drinks a day for men. Be aware of how much alcohol is in your drink. In the [place] one drink equals one 12 oz bottle of beer (355 mL), one 5 oz glass of wine (148 mL), or one 1 oz glass of hard liquor (44 mL). General information Avoid eating more than 2,300 mg of salt a day. If you have hypertension, you may need to reduce your sodium intake to 1,500 mg a day. Work with your health care provider to maintain a healthy body weight or to lose weight. Ask what an ideal weight is for you. Get at least 30 minutes of exercise that causes your heart to beat faster (aerobic exercise) most days of the week. Activities may include walking, swimming,  or biking. Work with your health care provider or dietitian to adjust your eating plan to your individual calorie needs. What foods should I eat? Fruits All fresh, dried, or frozen fruit. Canned fruit in natural juice (without added sugar). Vegetables Fresh or frozen vegetables (raw, steamed, roasted, or grilled). Low-sodium or reduced-sodium tomato and vegetable juice. Low-sodium or reduced-sodium tomato sauce and tomato paste. Low-sodium or reduced-sodium canned vegetables. Grains Whole-grain or whole-wheat bread. Whole-grain or  whole-wheat pasta. Brown rice. [place]. Bulgur. Whole-grain and low-sodium cereals. Pita bread. Low-fat, low-sodium crackers. Whole-wheat flour tortillas. Meats and other proteins Skinless chicken or [place]. Ground chicken or [place]. Pork with fat trimmed off. Fish and seafood. Egg whites. Dried beans, peas, or lentils. Unsalted nuts, nut butters, and seeds. Unsalted canned beans. Lean cuts of beef with fat trimmed off. Low-sodium, lean precooked or cured meat, such as sausages or meat loaves. Dairy Low-fat (1%) or fat-free (skim) milk. Reduced-fat, low-fat, or fat-free cheeses. Nonfat, low-sodium ricotta or cottage cheese. Low-fat or nonfat yogurt. Low-fat, low-sodium cheese. Fats and oils Soft margarine without trans fats. Vegetable oil. Reduced-fat, low-fat, or light mayonnaise and salad dressings (reduced-sodium). Canola, safflower, olive, avocado, soybean, and sunflower oils. Avocado. Seasonings and condiments Herbs. Spices. Seasoning mixes without salt. Other foods Unsalted popcorn and pretzels. Fat-free sweets. The items listed above may not be a complete list of foods and beverages you can eat. Contact a dietitian for more information. What foods should I avoid? Fruits Canned fruit in a light or heavy syrup. Fried fruit. Fruit in cream or butter sauce. Vegetables Creamed or fried vegetables. Vegetables in a cheese sauce. Regular canned vegetables (not low-sodium or reduced-sodium). Regular canned tomato sauce and paste (not low-sodium or reduced-sodium). Regular tomato and vegetable juice (not low-sodium or reduced-sodium). Angie Fava. Olives. Grains Baked goods made with fat, such as croissants, muffins, or some breads. Dry pasta or rice meal packs. Meats and other proteins Fatty cuts of meat. Ribs. Fried meat. Berniece Salines. Bologna, salami, and other precooked or cured meats, such as sausages or meat loaves. Fat from the back of a pig (fatback). Bratwurst. Salted nuts and seeds. Canned  beans with added salt. Canned or smoked fish. Whole eggs or egg yolks. Chicken or [place] with skin. Dairy Whole or 2% milk, cream, and half-and-half. Whole or full-fat cream cheese. Whole-fat or sweetened yogurt. Full-fat cheese. Nondairy creamers. Whipped toppings. Processed cheese and cheese spreads. Fats and oils Butter. Stick margarine. Lard. Shortening. Ghee. Bacon fat. Tropical oils, such as coconut, palm kernel, or palm oil. Seasonings and condiments Onion salt, garlic salt, seasoned salt, table salt, and sea salt. Worcestershire sauce. Tartar sauce. Barbecue sauce. Teriyaki sauce. Soy sauce, including reduced-sodium. Steak sauce. Canned and packaged gravies. Fish sauce. Oyster sauce. Cocktail sauce. Store-bought horseradish. Ketchup. Mustard. Meat flavorings and tenderizers. Bouillon cubes. Hot sauces. Pre-made or packaged marinades. Pre-made or packaged taco seasonings. Relishes. Regular salad dressings. Other foods Salted popcorn and pretzels. The items listed above may not be a complete list of foods and beverages you should avoid. Contact a dietitian for more information. Where to find more information National Heart, Lung, and Blood Institute: https://wilson-eaton.com/ American Heart Association: www.heart.org Academy of Nutrition and Dietetics: www.eatright.[place]: www.kidney.org Summary The DASH eating plan is a healthy eating plan that has been shown to reduce high blood pressure (hypertension). It may also reduce your risk for type 2 diabetes, heart disease, and stroke. When on the DASH eating plan, aim to eat more fresh fruits and vegetables, whole  grains, lean proteins, low-fat dairy, and heart-healthy fats. With the DASH eating plan, you should limit salt (sodium) intake to 2,300 mg a day. If you have hypertension, you may need to reduce your sodium intake to 1,500 mg a day. Work with your health care provider or dietitian to adjust your eating plan to your  individual calorie needs. This information is not intended to replace advice given to you by your health care provider. Make sure you discuss any questions you have with your health care provider. Document Revised: 12/20/2018 Document Reviewed: 12/20/2018 Elsevier Patient Education  [place].

## 2022-06-06 NOTE — Progress Notes (Signed)
Subjective:   By signing my name below, I, [name] Little, attest that this documentation has been prepared under the direction and in the presence of [name] Schultz, DO. 06/06/2022   Patient ID: [name] Little, female    DOB: Nov 19, 1933, 89 y.o.   MRN: [id number]  Chief Complaint  Patient presents with   Hypertension   Follow-up    Hypertension Pertinent negatives include no blurred vision, chest pain, headaches, malaise/fatigue, palpitations or shortness of breath.   Patient is in today for a follow up visit.   Her blood pressures is elevated during this visit. Her blood pressure is elevated at home. She reports stressors at home may be contributing to her blood pressure. She continues taking 40 mg Lasix daily PO, 320 mg Diovan daily PO, 10 mg Bystolic daily PO. BP Readings from Last 3 Encounters:  06/06/22 (!) 140/90  05/15/22 (!) 180/80  04/24/22 (!) 160/98   Pulse Readings from Last 3 Encounters:  06/06/22 66  05/15/22 67  04/24/22 75   She reports no new issues with her sleep.    Past Medical History:  Diagnosis Date   Anticoagulated on Coumadin    long-term for factor V---  managed by coumadin clinic and pcp   Diet-controlled type 2 diabetes mellitus (HCC)    followed by pcp   Factor V deficiency (HCC) 1998   anticoagulated on coumadin   GAD (generalized anxiety disorder)    Heart murmur    Hemorrhoids    History of avascular necrosis of capital femoral epiphysis 1999   s/p THA right   History of basal cell carcinoma (BCC) excision    scalp/ neck   History of Clostridium difficile infection    History of DVT of lower extremity 1998;  04/ 2006   LLE   History of external beam radiation therapy 2010   right breast cancer   History of ITP    per pt hx long-term use prednisone for ITP,  1998 s/p total splenctomy   History of kidney stones    History of melanoma 1980s   s/p  right calf melanoma excision , per pt not malignant and localized ,  no  recurrence   History of paroxysmal supraventricular tachycardia    (02-23-2020  pt stated have not had any issues / symptoms in several years   History of pulmonary embolus (PE) 04/2004   History of right breast cancer 2010   08-10-2008 s/p  right lumpectomy w/ sln bx (partial mastectomy) , ER+, Stage I,  completed radiation 2010,  per pt no recurrance   Hypertension    Macular degeneration, bilateral    mild   Mitral valve regurgitation 09/01/2016   cardiology-- dr Eden Emms--- per note mild to moderate without stenosis,     Mixed hyperlipidemia 05/18/2015   Nocturia    OA (osteoarthritis)    lumbar stenosis, radiculopathy, OA- L hip   Personal history of colonic polyps    hyperplastic   Renal calculus, right    Right ureteral calculus    Thoracic ascending aortic aneurysm (HCC)    last CT angio chest in epic 11-29-2016  , 3.7cm    Past Surgical History:  Procedure Laterality Date   ABDOMINAL HYSTERECTOMY  1973   BREAST LUMPECTOMY WITH RADIOACTIVE SEED AND SENTINEL LYMPH NODE BIOPSY Right 08/10/2008   @[facility]    CATARACT EXTRACTION W/ INTRAOCULAR LENS  IMPLANT, BILATERAL Bilateral 2001   CYSTOSCOPY WITH RETROGRADE PYELOGRAM, URETEROSCOPY AND STENT PLACEMENT Right 02/25/2020  Procedure: CYSTOSCOPY WITH RETROGRADE PYELOGRAM, URETEROSCOPY AND STENT EXCHANGE;  Surgeon: Sebastian Ache, MD;  Location: [facility];  Service: Urology;  Laterality: Right;  75 MINS   CYSTOSCOPY WITH STENT PLACEMENT Right 01/27/2020   Procedure: CYSTOSCOPY WITH STENT PLACEMENT;  Surgeon: Sebastian Ache, MD;  Location: [facility];  Service: Urology;  Laterality: Right;   CYSTOSCOPY/URETEROSCOPY/HOLMIUM LASER/STENT PLACEMENT Left 06-27-2018  dr Logan Bores,  @[facility]    HOLMIUM LASER APPLICATION Right 02/25/2020   Procedure: HOLMIUM LASER APPLICATION;  Surgeon: Sebastian Ache, MD;  Location: [facility];  Service: Urology;  Laterality: Right;   I & D EXTREMITY Right 10/26/2017   Procedure:  IRRIGATION AND DEBRIDEMENT RIGHT HAND;  Surgeon: Bradly Bienenstock, MD;  Location: [facility];  Service: Orthopedics;  Laterality: Right;   LUMBAR LAMINECTOMY/DECOMPRESSION MICRODISCECTOMY  04/11/2011   Procedure: LUMBAR LAMINECTOMY/DECOMPRESSION MICRODISCECTOMY;  Surgeon: Barnett Abu, MD;  Location: [facility];  Service: Neurosurgery;  Laterality: N/A;  Lumbar Five-Sacral One Microdiscectomy   MELANOMA EXCISION Right 1980s   right calf   SPLENECTOMY, TOTAL  1998   TOTAL HIP ARTHROPLASTY Right 1999   UPPER GASTROINTESTINAL ENDOSCOPY      Family History  Problem Relation Age of Onset   Heart disease Mother    Heart disease Father    Alzheimer's disease Sister    Dementia Sister    Dementia Sister    Heart disease Other        uncles   Clotting disorder Maternal Uncle    Breast cancer Paternal Aunt    Cancer Paternal Aunt        breast   Stroke Sister    Dementia Sister        mean, carotid artery disease   Colon cancer Neg Hx    Anesthesia problems Neg Hx    Hypotension Neg Hx    Malignant hyperthermia Neg Hx    Pseudochol deficiency Neg Hx     Social History   Socioeconomic History   Marital status: Married    Spouse name: Not on file   Number of children: 4   Years of education: Not on file   Highest education level: Not on file  Occupational History   Occupation: Retired  Tobacco Use   Smoking status: Never   Smokeless tobacco: Never  Vaping Use   Vaping Use: Never used  Substance and Sexual Activity   Alcohol use: No    Alcohol/week: 0.0 standard drinks of alcohol   Drug use: Never   Sexual activity: Not on file  Other Topics Concern   Not on file  Social History Narrative   Married 1955   2 sons - '59, '61, 2 daughters '55, '57; 8 grandchildren; 1 great grand   I-ADLs      Social Determinants of Corporate investment banker Strain: Not on file  Food Insecurity: Not on file  Transportation Needs: Not on file  Physical Activity: Not on file  Stress: Not  on file  Social Connections: Not on file  Intimate Partner Violence: Not on file    Outpatient Medications Prior to Visit  Medication Sig Dispense Refill   Accu-Chek Softclix Lancets lancets Use to check sugar daily or as needed. 100 each 1   B Complex Vitamins (VITAMIN B COMPLEX PO) Take 1 tablet by mouth daily.      blood glucose meter kit and supplies KIT Dispense based on patient and insurance preference. Use up to four times daily as directed. Dx: E11.9 1 each  0   Blood Pressure Monitoring (BLOOD PRESSURE KIT) DEVI 1 Device by Does not apply route daily as needed. Fill per patient insurance preference Dx: Hypertension 1 Device 0   Cranberry 1000 MG CAPS Take 1 capsule by mouth 2 (two) times daily.     Dietary Management Product (TOZAL PO) Take by mouth. Per patient taking everyday eye vitamin     furosemide (LASIX) 20 MG tablet Take 1 tablet (20 mg total) by mouth daily as needed for fluid or edema. 90 tablet 3   furosemide (LASIX) 40 MG tablet Take 1 tablet (40 mg total) by mouth daily. 30 tablet 3   glucose blood (ACCU-CHEK GUIDE) test strip USE TO TEST BLOOD SUGAR UP TO 4 TIMES DAILY AS INSTRUCTED 100 strip 5   NONFORMULARY OR COMPOUNDED ITEM Compression socks  20-30 mm/hg #1  as directed 1 each 1   POTASSIUM GLUCONATE PO Take by mouth. Per patient taking 650 mg tablet daily     traZODone (DESYREL) 50 MG tablet Take 0.5-1 tablets (25-50 mg total) by mouth at bedtime as needed for sleep. 30 tablet 3   valsartan (DIOVAN) 320 MG tablet Take 1 tablet (320 mg total) by mouth daily. 90 tablet 3   Vitamin D, Ergocalciferol, (DRISDOL) 1.25 MG (50000 UNIT) CAPS capsule Take 1 capsule (50,000 Units total) by mouth every 7 (seven) days. 12 capsule 0   warfarin (COUMADIN) 5 MG tablet TAKE 1 TABLET BY MOUTH DAILY EXCEPT TAKE 1/2 TABLET ON TUESDAYS, THURSDAY, SATURDAYS OR AS DIRECTED BY ANTICOAGULATION CLINIC 100 tablet 1   nebivolol (BYSTOLIC) 10 MG tablet Take 1 tablet (10 mg total) by mouth daily.  90 tablet 3   No facility-administered medications prior to visit.    Allergies  Allergen Reactions   Amlodipine Swelling    "my whole body swelled"   Carvedilol Swelling    "my whole body swelled"   Chlorthalidone     Patient reported extreme weakness.   Hydralazine Other (See Comments)    Joint and chest pain   Statins Other (See Comments)    Weakness, pain Lipitor and Pravachol Weakness, pain Lipitor and Pravachol   Zetia [Ezetimibe]     Unable to walk, severe leg pain   Lyrica [Pregabalin] Other (See Comments)    Weakness and joint pain   Cefuroxime Axetil     REACTION: Ddoes not remember reaction   Sulfonamide Derivatives Rash    REACTION: Rash    Review of Systems  Constitutional:  Negative for fever and malaise/fatigue.  HENT:  Negative for congestion.   Eyes:  Negative for blurred vision.  Respiratory:  Negative for shortness of breath.   Cardiovascular:  Negative for chest pain, palpitations and leg swelling.  Gastrointestinal:  Negative for abdominal pain, blood in stool and nausea.  Genitourinary:  Negative for dysuria and frequency.  Musculoskeletal:  Negative for falls.  Skin:  Negative for rash.  Neurological:  Negative for dizziness, loss of consciousness and headaches.  Endo/Heme/Allergies:  Negative for environmental allergies.  Psychiatric/Behavioral:  Negative for depression. The patient is not nervous/anxious.        Objective:    Physical Exam Vitals and nursing note reviewed.  Constitutional:      General: She is not in acute distress.    Appearance: Normal appearance. She is not ill-appearing.  HENT:     Head: Normocephalic and atraumatic.     Right Ear: External ear normal.     Left Ear: External ear normal.  Eyes:  Extraocular Movements: Extraocular movements intact.     Pupils: Pupils are equal, round, and reactive to light.  Cardiovascular:     Rate and Rhythm: Normal rate and regular rhythm.     Heart sounds: Murmur heard.      Systolic murmur is present with a grade of 2/6.     No gallop.  Pulmonary:     Effort: Pulmonary effort is normal. No respiratory distress.     Breath sounds: Normal breath sounds. No wheezing or rales.  Genitourinary:    General: Normal vulva.     Rectum: Normal.  Musculoskeletal:        General: Normal range of motion.     Cervical back: Normal range of motion.  Skin:    General: Skin is warm and dry.  Neurological:     General: No focal deficit present.     Mental Status: She is alert and oriented to person, place, and time.  Psychiatric:        Mood and Affect: Mood normal.        Judgment: Judgment normal.     BP (!) 140/90 (BP Location: Left Arm, Patient Position: Sitting, Cuff Size: Normal)   Pulse 66   Temp 97.9 F (36.6 C) (Oral)   Resp 20   Ht 5\' 3"  (1.6 m)   Wt 163 lb 12.8 oz (74.3 kg)   SpO2 94%   BMI 29.02 kg/m  Wt Readings from Last 3 Encounters:  06/06/22 163 lb 12.8 oz (74.3 kg)  05/15/22 165 lb 12.8 oz (75.2 kg)  04/24/22 168 lb 9.6 oz (76.5 kg)       Assessment & Plan:  Hypertension, unspecified type -     Nebivolol HCl; Take 1 tablet (20 mg total) by mouth daily.  Dispense: 90 tablet; Refill: 3  Essential hypertension Assessment & Plan: Poorly controlled will alter medications, encouraged DASH diet, minimize caffeine and obtain adequate sleep. Report concerning symptoms and follow up as directed and as needed      I, [name] Schultz, DO, personally preformed the services described in this documentation.  All medical record entries made by the scribe were at my direction and in my presence.  I have reviewed the chart and discharge instructions (if applicable) and agree that the record reflects my personal performance and is accurate and complete. 06/06/2022   I,[name] Little,acting as a scribe for [name] Schultz, DO.,have documented all relevant documentation on the behalf of [name] Schultz, DO,as directed by  [name] Schultz, DO while in the presence of [name] Schultz, DO.   [name] Schultz, DO

## 2022-06-06 NOTE — Assessment & Plan Note (Signed)
Poorly controlled will alter medications, encouraged DASH diet, minimize caffeine and obtain adequate sleep. Report concerning symptoms and follow up as directed and as needed 

## 2022-06-27 NOTE — Progress Notes (Signed)
Cardiology Office Note   Date:  07/07/2022   ID:  [name] Little, [place] Apr 15, 1933, MRN [id number]  PCP:  [name] Schultz, DO  Cardiologist:   [name] Haws, MD     History of Present Illness:  89 y.o. first seen August 2018 for murmur and mitral valve disease.   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 CRF;s HTN, elevated lipids and DM.  Intolerant to statins with lipitor and pravachol causing weakness.Was on low dose crestor with LDL 86  Echo done 08/24/16 for edema reviewed  EF 55-60% mild LVH  Mild to moderate MR She has no history of chest pain palpitations PND/Orhtopnea rheumatic fever or SBE  No cardiac complaints Married 67 years has 4 children that look after her   Still drives and mows [place].5 acres at home in [facility] Understands that salt indiscretion leads to LE edema Does not take her lasix daily No signs of arrhythmia or CHF   No issues with heart. Her first cousin is [name] Little who I care for as well Unfortunately  He is blind now from MD  Using lasix for postphlebitic edema  TTE updated 02/03/21 EF 60-65%   Having hard time handling husband who has advanced dementia and some paranoia  Her son lives next door otherwise he would likely need to be in memory unit.   Dr [name] Little is adjusting BP meds Nebivolol added last month Some white coat component but runs in 150-160 mmHg range at home   Past Medical History:  Diagnosis Date   Anticoagulated on Coumadin    long-term for factor V---  managed by coumadin clinic and pcp   Diet-controlled type 2 diabetes mellitus (HCC)    followed by pcp   Factor V deficiency (HCC) 1998   anticoagulated on coumadin   GAD (generalized anxiety disorder)    Heart murmur    Hemorrhoids    History of avascular necrosis of capital femoral epiphysis 1999   s/p THA right   History of basal cell carcinoma (BCC) excision    scalp/ neck   History of Clostridium difficile infection    History of DVT of lower extremity 1998;  04/  2006   LLE   History of external beam radiation therapy 2010   right breast cancer   History of ITP    per pt hx long-term use prednisone for ITP,  1998 s/p total splenctomy   History of kidney stones    History of melanoma 1980s   s/p  right calf melanoma excision , per pt not malignant and localized ,  no recurrence   History of paroxysmal supraventricular tachycardia    (02-23-2020  pt stated have not had any issues / symptoms in several years   History of pulmonary embolus (PE) 04/2004   History of right breast cancer 2010   08-10-2008 s/p  right lumpectomy w/ sln bx (partial mastectomy) , ER+, Stage I,  completed radiation 2010,  per pt no recurrance   Hypertension    Macular degeneration, bilateral    mild   Mitral valve regurgitation 09/01/2016   cardiology-- dr Eden Emms--- per note mild to moderate without stenosis,     Mixed hyperlipidemia 05/18/2015   Nocturia    OA (osteoarthritis)    lumbar stenosis, radiculopathy, OA- L hip   Personal history of colonic polyps    hyperplastic   Renal calculus, right    Right ureteral calculus    Thoracic ascending aortic aneurysm (HCC)  last CT angio chest in epic 11-29-2016  , 3.7cm    Past Surgical History:  Procedure Laterality Date   ABDOMINAL HYSTERECTOMY  1973   BREAST LUMPECTOMY WITH RADIOACTIVE SEED AND SENTINEL LYMPH NODE BIOPSY Right 08/10/2008   @[facility]    CATARACT EXTRACTION W/ INTRAOCULAR LENS  IMPLANT, BILATERAL Bilateral 2001   CYSTOSCOPY WITH RETROGRADE PYELOGRAM, URETEROSCOPY AND STENT PLACEMENT Right 02/25/2020   Procedure: CYSTOSCOPY WITH RETROGRADE PYELOGRAM, URETEROSCOPY AND STENT EXCHANGE;  Surgeon: Sebastian Ache, MD;  Location: [facility];  Service: Urology;  Laterality: Right;  75 MINS   CYSTOSCOPY WITH STENT PLACEMENT Right 01/27/2020   Procedure: CYSTOSCOPY WITH STENT PLACEMENT;  Surgeon: Sebastian Ache, MD;  Location: [facility];  Service: Urology;  Laterality: Right;    CYSTOSCOPY/URETEROSCOPY/HOLMIUM LASER/STENT PLACEMENT Left 06-27-2018  dr Logan Bores,  @[facility]    HOLMIUM LASER APPLICATION Right 02/25/2020   Procedure: HOLMIUM LASER APPLICATION;  Surgeon: Sebastian Ache, MD;  Location: [facility];  Service: Urology;  Laterality: Right;   I & D EXTREMITY Right 10/26/2017   Procedure: IRRIGATION AND DEBRIDEMENT RIGHT HAND;  Surgeon: Bradly Bienenstock, MD;  Location: [facility];  Service: Orthopedics;  Laterality: Right;   LUMBAR LAMINECTOMY/DECOMPRESSION MICRODISCECTOMY  04/11/2011   Procedure: LUMBAR LAMINECTOMY/DECOMPRESSION MICRODISCECTOMY;  Surgeon: Barnett Abu, MD;  Location: [facility];  Service: Neurosurgery;  Laterality: N/A;  Lumbar Five-Sacral One Microdiscectomy   MELANOMA EXCISION Right 1980s   right calf   SPLENECTOMY, TOTAL  1998   TOTAL HIP ARTHROPLASTY Right 1999   UPPER GASTROINTESTINAL ENDOSCOPY       Current Outpatient Medications  Medication Sig Dispense Refill   Accu-Chek Softclix Lancets lancets Use to check sugar daily or as needed. 100 each 1   B Complex Vitamins (VITAMIN B COMPLEX PO) Take 1 tablet by mouth daily.      blood glucose meter kit and supplies KIT Dispense based on patient and insurance preference. Use up to four times daily as directed. Dx: E11.9 1 each 0   Blood Pressure Monitoring (BLOOD PRESSURE KIT) DEVI 1 Device by Does not apply route daily as needed. Fill per patient insurance preference Dx: Hypertension 1 Device 0   Cranberry 1000 MG CAPS Take 1 capsule by mouth 2 (two) times daily.     Dietary Management Product (TOZAL PO) Take by mouth. Per patient taking everyday eye vitamin     furosemide (LASIX) 20 MG tablet Take 1 tablet (20 mg total) by mouth daily as needed for fluid or edema. 90 tablet 3   furosemide (LASIX) 40 MG tablet Take 1 tablet (40 mg total) by mouth daily. 30 tablet 3   glucose blood (ACCU-CHEK GUIDE) test strip USE TO TEST BLOOD SUGAR UP TO 4 TIMES DAILY AS INSTRUCTED 100 strip 5    Nebivolol HCl 20 MG TABS Take 1 tablet (20 mg total) by mouth daily. 90 tablet 3   NONFORMULARY OR COMPOUNDED ITEM Compression socks  20-30 mm/hg #1  as directed 1 each 1   POTASSIUM GLUCONATE PO Take by mouth. Per patient taking 650 mg tablet daily     traZODone (DESYREL) 50 MG tablet Take 0.5-1 tablets (25-50 mg total) by mouth at bedtime as needed for sleep. 30 tablet 3   valsartan (DIOVAN) 320 MG tablet Take 1 tablet (320 mg total) by mouth daily. 90 tablet 3   Vitamin D, Ergocalciferol, (DRISDOL) 1.25 MG (50000 UNIT) CAPS capsule Take 1 capsule (50,000 Units total) by mouth every 7 (seven) days. 12 capsule 0  warfarin (COUMADIN) 5 MG tablet TAKE 1 TABLET BY MOUTH DAILY EXCEPT TAKE 1/2 TABLET ON TUESDAYS, THURSDAY, SATURDAYS OR AS DIRECTED BY ANTICOAGULATION CLINIC 100 tablet 1   No current facility-administered medications for this visit.    Allergies:   Amlodipine, Carvedilol, Chlorthalidone, Hydralazine, Statins, Zetia [ezetimibe], Lyrica [pregabalin], Cefuroxime axetil, and Sulfonamide derivatives    Social History:  The patient  reports that she has never smoked. She has never used smokeless tobacco. She reports that she does not drink alcohol and does not use drugs.   Family History:  The patient's family history includes Alzheimer's disease in her sister; Breast cancer in her paternal aunt; Cancer in her paternal aunt; Clotting disorder in her maternal uncle; Dementia in her sister, sister, and sister; Heart disease in her father, mother, and another family member; Stroke in her sister.    ROS:  Please see the history of present illness.   Otherwise, review of systems are positive for none.   All other systems are reviewed and negative.    PHYSICAL EXAM: VS:  BP (!) 200/78   Pulse 81   Ht 5\' 3"  (1.6 m)   Wt 167 lb 6.4 oz (75.9 kg)   SpO2 91%   BMI 29.65 kg/m  , BMI Body mass index is 29.65 kg/m. Affect appropriate Healthy:  appears stated age HEENT: normal Neck supple  with no adenopathy JVP normal no bruits no thyromegaly Lungs clear with no wheezing and good diaphragmatic motion Heart:  S1/S2 soft apical MR murmur, no rub, gallop or click PMI normal Abdomen: benighn, BS positve, no tenderness, no AAA no bruit.  No HSM or HJR Distal pulses intact with no bruits Plus 2 RLE edema Plus one LLE chronic stasis changes  Neuro non-focal Skin warm and dry No muscular weakness    EKG:  07/07/2022 NSR rate 81 LAD LVH PR 260 msec   Recent Labs: 04/24/2022: ALT 10; BUN 18; Creatinine, Ser 0.78; Hemoglobin 14.6; Platelets 300.0; Potassium 4.5; Sodium 140; TSH 3.57     Wt Readings from Last 3 Encounters:  07/07/22 167 lb 6.4 oz (75.9 kg)  06/06/22 163 lb 12.8 oz (74.3 kg)  05/15/22 165 lb 12.8 oz (75.2 kg)      Other studies Reviewed: Additional studies/ records that were reviewed today include: Notes Dr Rogelia Rohrer Echo done July 2018 ECG's .    ASSESSMENT AND PLAN:  1. MR- mild by TTE done 02/03/21 observe  2. HTN labile and high f/u primary DASH diet home monitor Currently on Diovan, Nebivolol and lasix Consider adding Norvasc next primary visit 3. DM:  Discussed low carb diet.  Target hemoglobin A1c is 6.5 or less.  Continue current medications. 4. Cholesterol on low dose crestor LDL 86 given age an dlack of CAD with drug intolerances would not try to escalate Rx 5. Hypercoagulability: on coumadin followed by primary INR 2.3 05/30/22 6. Anxiety:  Seems stable  7. Edema: dependant from venous disease cut out salt in chicken tenders PRN Lasix    Current medicines are reviewed at length with the patient today.  The patient does not have concerns regarding medicines.  The following changes have been made:  no change  Labs/ tests ordered today include: Echo for murmur/MR   No orders of the defined types were placed in this encounter.     Disposition:   FU with me in a year      Signed, [name] Haws, MD  07/07/2022 9:05 AM    [facility] [place], [place], [place]  [address] Phone: [phone]; Fax: [phone]

## 2022-07-07 ENCOUNTER — Encounter: Payer: Self-pay | Admitting: Cardiovascular Disease

## 2022-07-07 ENCOUNTER — Ambulatory Visit: Payer: Medicare PPO | Attending: Cardiovascular Disease | Admitting: Cardiovascular Disease

## 2022-07-07 VITALS — BP 200/78 | HR 81 | Ht 63.0 in | Wt 167.4 lb

## 2022-07-07 DIAGNOSIS — E785 Hyperlipidemia, unspecified: Secondary | ICD-10-CM

## 2022-07-07 DIAGNOSIS — I340 Nonrheumatic mitral (valve) insufficiency: Secondary | ICD-10-CM | POA: Diagnosis not present

## 2022-07-07 DIAGNOSIS — I10 Essential (primary) hypertension: Secondary | ICD-10-CM | POA: Diagnosis not present

## 2022-07-07 DIAGNOSIS — E782 Mixed hyperlipidemia: Secondary | ICD-10-CM

## 2022-07-07 NOTE — Patient Instructions (Signed)
Medication Instructions:  Your physician recommends that you continue on your current medications as directed. Please refer to the Current Medication list given to you today.  *If you need a refill on your cardiac medications before your next appointment, please call your pharmacy*  Lab Work: If you have labs (blood work) drawn today and your tests are completely normal, you will receive your results only by: MyChart Message (if you have MyChart) OR A paper copy in the mail If you have any lab test that is abnormal or we need to change your treatment, we will call you to review the results.  Follow-Up: At [facility], you and your health needs are our priority.  As part of our continuing mission to provide you with exceptional heart care, we have created designated Provider Care Teams.  These Care Teams include your primary Cardiologist (physician) and Advanced Practice Providers (APPs -  Physician Assistants and Nurse Practitioners) who all work together to provide you with the care you need, when you need it.  We recommend signing up for the patient portal called "MyChart".  Sign up information is provided on this After Visit Summary.  MyChart is used to connect with patients for Virtual Visits (Telemedicine).  Patients are able to view lab/test results, encounter notes, upcoming appointments, etc.  Non-urgent messages can be sent to your provider as well.   To learn more about what you can do with MyChart, go to [email or web address].    Your next appointment:   1 year(s)  Provider:   Peter Nishan, MD     

## 2022-07-10 ENCOUNTER — Ambulatory Visit: Payer: Medicare PPO | Admitting: Cardiovascular Disease

## 2022-07-11 ENCOUNTER — Ambulatory Visit: Payer: Medicare PPO

## 2022-07-11 DIAGNOSIS — Z7901 Long term (current) use of anticoagulants: Secondary | ICD-10-CM

## 2022-07-11 LAB — POCT INR: INR: 2.7 (ref 2.0–3.0)

## 2022-07-11 NOTE — Patient Instructions (Addendum)
Pre visit review using our clinic review tool, if applicable. No additional management support is needed unless otherwise documented below in the visit note.  Continue 1 tablet daily except take 1/2 tablet on Tuesdays, Thursdays and Saturdays. Recheck in 6 weeks. 

## 2022-07-11 NOTE — Progress Notes (Signed)
Continue 1 tablet daily except take 1/2 tablet on Tuesdays, Thursdays and Saturdays. Recheck in 6 weeks. 

## 2022-07-12 DIAGNOSIS — Z86006 Personal history of melanoma in-situ: Secondary | ICD-10-CM | POA: Diagnosis not present

## 2022-07-12 DIAGNOSIS — L820 Inflamed seborrheic keratosis: Secondary | ICD-10-CM | POA: Diagnosis not present

## 2022-07-12 DIAGNOSIS — C4359 Malignant melanoma of other part of trunk: Secondary | ICD-10-CM | POA: Diagnosis not present

## 2022-07-12 DIAGNOSIS — L718 Other rosacea: Secondary | ICD-10-CM | POA: Diagnosis not present

## 2022-07-12 DIAGNOSIS — L821 Other seborrheic keratosis: Secondary | ICD-10-CM | POA: Diagnosis not present

## 2022-07-12 DIAGNOSIS — D225 Melanocytic nevi of trunk: Secondary | ICD-10-CM | POA: Diagnosis not present

## 2022-07-12 DIAGNOSIS — C44329 Squamous cell carcinoma of skin of other parts of face: Secondary | ICD-10-CM | POA: Diagnosis not present

## 2022-07-12 DIAGNOSIS — Z08 Encounter for follow-up examination after completed treatment for malignant neoplasm: Secondary | ICD-10-CM | POA: Diagnosis not present

## 2022-07-12 DIAGNOSIS — L814 Other melanin hyperpigmentation: Secondary | ICD-10-CM | POA: Diagnosis not present

## 2022-07-12 DIAGNOSIS — I872 Venous insufficiency (chronic) (peripheral): Secondary | ICD-10-CM | POA: Diagnosis not present

## 2022-07-26 DIAGNOSIS — L080 Pyoderma: Secondary | ICD-10-CM | POA: Diagnosis not present

## 2022-07-31 DIAGNOSIS — C44329 Squamous cell carcinoma of skin of other parts of face: Secondary | ICD-10-CM | POA: Diagnosis not present

## 2022-08-02 ENCOUNTER — Emergency Department (HOSPITAL_COMMUNITY): Payer: Medicare PPO

## 2022-08-02 ENCOUNTER — Emergency Department (HOSPITAL_COMMUNITY)
Admission: EM | Admit: 2022-08-02 | Discharge: 2022-08-02 | Disposition: A | Discharge status: Home / Self Care | Payer: Medicare PPO | Attending: Emergency Medicine | Admitting: Emergency Medicine

## 2022-08-02 ENCOUNTER — Other Ambulatory Visit: Payer: Self-pay

## 2022-08-02 DIAGNOSIS — R110 Nausea: Secondary | ICD-10-CM | POA: Insufficient documentation

## 2022-08-02 DIAGNOSIS — R072 Precordial pain: Secondary | ICD-10-CM | POA: Insufficient documentation

## 2022-08-02 DIAGNOSIS — Z85820 Personal history of malignant melanoma of skin: Secondary | ICD-10-CM | POA: Insufficient documentation

## 2022-08-02 DIAGNOSIS — Z85828 Personal history of other malignant neoplasm of skin: Secondary | ICD-10-CM | POA: Insufficient documentation

## 2022-08-02 DIAGNOSIS — R21 Rash and other nonspecific skin eruption: Secondary | ICD-10-CM | POA: Diagnosis not present

## 2022-08-02 DIAGNOSIS — Z853 Personal history of malignant neoplasm of breast: Secondary | ICD-10-CM | POA: Insufficient documentation

## 2022-08-02 DIAGNOSIS — E119 Type 2 diabetes mellitus without complications: Secondary | ICD-10-CM | POA: Diagnosis not present

## 2022-08-02 DIAGNOSIS — I700 Atherosclerosis of aorta: Secondary | ICD-10-CM | POA: Diagnosis not present

## 2022-08-02 DIAGNOSIS — I517 Cardiomegaly: Secondary | ICD-10-CM | POA: Diagnosis not present

## 2022-08-02 DIAGNOSIS — I10 Essential (primary) hypertension: Secondary | ICD-10-CM | POA: Insufficient documentation

## 2022-08-02 DIAGNOSIS — R0789 Other chest pain: Secondary | ICD-10-CM | POA: Diagnosis not present

## 2022-08-02 LAB — BASIC METABOLIC PANEL
Anion gap: 10 (ref 5–15)
BUN: 28 mg/dL — ABNORMAL HIGH (ref 8–23)
CO2: 24 mmol/L (ref 22–32)
Calcium: 8.8 mg/dL — ABNORMAL LOW (ref 8.9–10.3)
Chloride: 105 mmol/L (ref 98–111)
Creatinine, Ser: 0.97 mg/dL (ref 0.44–1.00)
GFR, Estimated: 57 mL/min — ABNORMAL LOW (ref >60)
Glucose, Bld: 148 mg/dL — ABNORMAL HIGH (ref 70–99)
Potassium: 3.8 mmol/L (ref 3.5–5.1)
Sodium: 139 mmol/L (ref 135–145)

## 2022-08-02 LAB — CBC
HCT: 50.2 % — ABNORMAL HIGH (ref 36.0–46.0)
Hemoglobin: 16 g/dL — ABNORMAL HIGH (ref 12.0–15.0)
MCH: 29.3 pg (ref 26.0–34.0)
MCHC: 31.9 g/dL (ref 30.0–36.0)
MCV: 91.9 fL (ref 80.0–100.0)
Platelets: 359 10*3/uL (ref 150–400)
RBC: 5.46 MIL/uL — ABNORMAL HIGH (ref 3.87–5.11)
RDW: 14.9 % (ref 11.5–15.5)
WBC: 10.5 10*3/uL (ref 4.0–10.5)
nRBC: 0 % (ref 0.0–0.2)

## 2022-08-02 LAB — HEPATIC FUNCTION PANEL
ALT: 16 U/L (ref 0–44)
AST: 23 U/L (ref 15–41)
Albumin: 3.6 g/dL (ref 3.5–5.0)
Alkaline Phosphatase: 65 U/L (ref 38–126)
Bilirubin, Direct: <0.1 mg/dL (ref 0.0–0.2)
Total Bilirubin: 0.5 mg/dL (ref 0.3–1.2)
Total Protein: 7.1 g/dL (ref 6.5–8.1)

## 2022-08-02 LAB — TROPONIN I (HIGH SENSITIVITY)
Troponin I (High Sensitivity): 7 ng/L (ref <18)
Troponin I (High Sensitivity): 7 ng/L (ref <18)

## 2022-08-02 LAB — PROTIME-INR
INR: 2.8 — ABNORMAL HIGH (ref 0.8–1.2)
Prothrombin Time: 29.9 seconds — ABNORMAL HIGH (ref 11.4–15.2)

## 2022-08-02 LAB — LIPASE, BLOOD: Lipase: 37 U/L (ref 11–51)

## 2022-08-02 NOTE — Discharge Instructions (Addendum)
You were seen in the emergency room today with chest discomfort.  Your lab work here is reassuring.  Would like for you to coordinate with your primary care in the coming week.    As we discussed, your rash may be due to your recent antibiotic.  I would like for you to stop taking that and coordinate with your primary care doctor.  If you develop worsening rash, fever, return of chest pain he should return to the emergency department for reevaluation.

## 2022-08-02 NOTE — ED Provider Notes (Signed)
Emergency Department Provider Note   I have reviewed the triage vital signs and the nursing notes.   HISTORY  Chief Complaint Chest Pain and Rash   HPI [name] Little is a 89 y.o. female with PMH reviewed presents to the emergency department with acute onset chest pressure.  Symptoms woke her from sleep early this morning.  She describes a tight/heavy pain in the right upper chest radiating to the center.  She had mild nausea but no diaphoresis or vomiting.  No shortness of breath.  Symptoms lasted for around 30 minutes and then resolved spontaneously.  She states initially she thought it was related to gallstones, which she has had in the past.  Denies abdominal or back pain.  No fevers.   Around the same time, she developed itching over her entire body and rash over her extremities and trunk.  She did have recent dermatologic surgery to her forehead and was started on an antibiotic prophylaxis following that procedure.  She is unsure of the name but states a large green capsule.  Family, at bedside, her having someone go over to the house to go through the patient's medicines and report back.   Past Medical History:  Diagnosis Date   Anticoagulated on Coumadin    Dathan Attia-term for factor V---  managed by coumadin clinic and pcp   Diet-controlled type 2 diabetes mellitus (HCC)    followed by pcp   Factor V deficiency (HCC) 1998   anticoagulated on coumadin   GAD (generalized anxiety disorder)    Heart murmur    Hemorrhoids    History of avascular necrosis of capital femoral epiphysis 1999   s/p THA right   History of basal cell carcinoma (BCC) excision    scalp/ neck   History of Clostridium difficile infection    History of DVT of lower extremity 1998;  04/ 2006   LLE   History of external beam radiation therapy 2010   right breast cancer   History of ITP    per pt hx Tahani Potier-term use prednisone for ITP,  1998 s/p total splenctomy   History of kidney stones    History of  melanoma 1980s   s/p  right calf melanoma excision , per pt not malignant and localized ,  no recurrence   History of paroxysmal supraventricular tachycardia    (02-23-2020  pt stated have not had any issues / symptoms in several years   History of pulmonary embolus (PE) 04/2004   History of right breast cancer 2010   08-10-2008 s/p  right lumpectomy w/ sln bx (partial mastectomy) , ER+, Stage I,  completed radiation 2010,  per pt no recurrance   Hypertension    Macular degeneration, bilateral    mild   Mitral valve regurgitation 09/01/2016   cardiology-- dr Eden Emms--- per note mild to moderate without stenosis,     Mixed hyperlipidemia 05/18/2015   Nocturia    OA (osteoarthritis)    lumbar stenosis, radiculopathy, OA- L hip   Personal history of colonic polyps    hyperplastic   Renal calculus, right    Right ureteral calculus    Thoracic ascending aortic aneurysm (HCC)    last CT angio chest in epic 11-29-2016  , 3.7cm    Review of Systems  Constitutional: No fever/chills Cardiovascular: Positive chest pain. Respiratory: Denies shortness of breath. Gastrointestinal: No abdominal pain.  Positive nausea, no vomiting.  No diarrhea.  No constipation. Genitourinary: Negative for dysuria. Musculoskeletal: Negative for back pain. Skin:  Positive itching rash over arms/trunk/legs. Neurological: Negative for headaches, focal weakness or numbness.  ____________________________________________   PHYSICAL EXAM:  VITAL SIGNS: ED Triage Vitals  Enc Vitals Group     BP 08/02/22 0415 (!) 135/103     Pulse Rate 08/02/22 0415 78     Resp 08/02/22 0415 19     Temp 08/02/22 0418 97.6 F (36.4 C)     Temp src --      SpO2 08/02/22 0415 92 %     Weight 08/02/22 0418 163 lb (73.9 kg)     Height 08/02/22 0418 5\' 3"  (1.6 m)   Constitutional: Alert and oriented. Well appearing and in no acute distress. Eyes: Conjunctivae are normal.  Head: Atraumatic. Nose: No  congestion/rhinnorhea. Mouth/Throat: Mucous membranes are moist.  Oropharynx non-erythematous. No angioedema.  Neck: No stridor.   Cardiovascular: Normal rate, regular rhythm. Good peripheral circulation. Grossly normal heart sounds.   Respiratory: Normal respiratory effort.  No retractions. Lungs CTAB. Gastrointestinal: Soft and nontender. No distention.  Musculoskeletal: No lower extremity tenderness nor edema. No gross deformities of extremities. Neurologic:  Normal speech and language. No gross focal neurologic deficits are appreciated.  Skin:  Skin is warm, dry and intact. Faint, erythema with blanching to touch. No urticaria. No petechia.   ____________________________________________   LABS (all labs ordered are listed, but only abnormal results are displayed)  Labs Reviewed  BASIC METABOLIC PANEL - Abnormal; Notable for the following components:      Result Value   Glucose, Bld 148 (*)    BUN 28 (*)    Calcium 8.8 (*)    GFR, Estimated 57 (*)    All other components within normal limits  CBC - Abnormal; Notable for the following components:   RBC 5.46 (*)    Hemoglobin 16.0 (*)    HCT 50.2 (*)    All other components within normal limits  PROTIME-INR - Abnormal; Notable for the following components:   Prothrombin Time 29.9 (*)    INR 2.8 (*)    All other components within normal limits  HEPATIC FUNCTION PANEL  LIPASE, BLOOD  TROPONIN I (HIGH SENSITIVITY)  TROPONIN I (HIGH SENSITIVITY)   ____________________________________________  EKG   EKG Interpretation Date/Time:  Wednesday August 02 2022 04:21:05 EDT Ventricular Rate:  88 PR Interval:  238 QRS Duration:  86 QT Interval:  385 QTC Calculation: 466 R Axis:   -72  Text Interpretation: Sinus rhythm Prolonged PR interval Left anterior fascicular block Anterior infarct, old Confirmed by Alona Bene [phone]) on 08/02/2022 5:14:26 AM         ____________________________________________   PROCEDURES  Procedure(s) performed:   Procedures  None  ____________________________________________   INITIAL IMPRESSION / ASSESSMENT AND PLAN / ED COURSE  Pertinent labs & imaging results that were available during my care of the patient were reviewed by me and considered in my medical decision making (see chart for details).   This patient is Presenting for Evaluation of CP, which does require a range of treatment options, and is a complaint that involves a high risk of morbidity and mortality.  The Differential Diagnoses includes but is not exclusive to acute coronary syndrome, aortic dissection, pulmonary embolism, cardiac tamponade, community-acquired pneumonia, pericarditis, musculoskeletal chest wall pain, etc.   I did obtain Additional Historical Information from son at bedside.    Clinical Laboratory Tests Ordered, included Troponin negative. No AKI. INR in range at 2.8. CBC without leukocytosis.   Radiologic Tests Ordered, included CXR. I independently  interpreted the images and agree with radiology interpretation.   Cardiac Monitor Tracing which shows NSR.    Social Determinants of Health Risk patient is a non-smoker.   Medical Decision Making: Summary:  Patient presents to the emergency department with acute onset chest pain which woke her from sleep.  Differential is broad as above.  No active pain at this time.  EKG without acute ischemic finding.  Rash, seems unrelated.  May be due to recent antibiotic given in the postoperative setting.  Family is investigating this antibiotic and will report back.   Reevaluation with update and discussion with patient and son at bedside. Symptoms improved. Will d/c her abx and follow with PCP and Dermatology. Had shared decision making discussion with patient regarding CP workup. Stable for d/c home. Strict ED return precautions discussed.   Considered admission but troponin  is reassuring and CP resolved. Will follow with Cardiology with strict ED return precautions.   Patient's presentation is most consistent with acute presentation with potential threat to life or bodily function.   Disposition: discharge  ____________________________________________  FINAL CLINICAL IMPRESSION(S) / ED DIAGNOSES  Final diagnoses:  Precordial chest pain  Rash    Note:  This document was prepared using Dragon voice recognition software and may include unintentional dictation errors.  Alona Bene, MD, [facility] Emergency Medicine    Torrell Krutz, Arlyss Repress, MD 08/03/22 [phone]

## 2022-08-02 NOTE — ED Triage Notes (Addendum)
Pt reports pressure like chest pain that began at 330am that woke her up from sleep, and then as the pain started pt began having a rash to arms, legs and face. Pt reports rash is itchy. Pt also had a cancer spot on face removed and has been taking abx since Monday but is unsure of which abx

## 2022-08-07 ENCOUNTER — Telehealth: Payer: Self-pay

## 2022-08-07 NOTE — Transitions of Care (Post Inpatient/ED Visit) (Signed)
08/07/2022  Name: [name] Little MRN: [id number] DOB: 11-07-1933  Today's TOC FU Call Status: Today's TOC FU Call Status:: Successful TOC FU Call Competed TOC FU Call Complete Date: 08/02/22  Transition Care Management Follow-up Telephone Call Date of Discharge: 08/02/22 Discharge Facility: [facility]) Type of Discharge: Emergency Department Reason for ED Visit: Cardiac Conditions, Other: (Chest Pain) How have you been since you were released from the hospital?: Better Any questions or concerns?: No  Items Reviewed: Did you receive and understand the discharge instructions provided?: Yes Medications obtained,verified, and reconciled?: No Medications Not Reviewed Reasons:: Other: (EMMI) Any new allergies since your discharge?: No Dietary orders reviewed?: No Do you have support at home?: Yes People in Home: spouse Name of Support/Comfort Primary Source: Broadus John  Medications Reviewed Today: Medications Reviewed Today     Reviewed by Sheppard Coil, CMA (Certified Medical Assistant) on 07/07/22 at [phone]  Med List Status: <None>   Medication Order Taking? Sig Documenting Provider Last Dose Status Informant  Accu-Chek Softclix Lancets lancets [id number] Yes Use to check sugar daily or as needed. Bradd Canary, MD Taking Active Self  B Complex Vitamins (VITAMIN B COMPLEX PO) [id number] Yes Take 1 tablet by mouth daily.  [provider] Taking Active Self  blood glucose meter kit and supplies KIT [id number] Yes Dispense based on patient and insurance preference. Use up to four times daily as directed. Dx: E11.9 Donato Schultz, DO Taking Active   Blood Pressure Monitoring (BLOOD PRESSURE KIT) DEVI [id number] Yes 1 Device by Does not apply route daily as needed. Fill per patient insurance preference Dx: Hypertension Bradd Canary, MD Taking Active Self  Cranberry 1000 MG CAPS [id number] Yes Take 1 capsule by mouth 2 (two) times daily. [provider] Taking  Active   Dietary Management Product (TOZAL PO) [id number] Yes Take by mouth. Per patient taking everyday eye vitamin [provider] Taking Active   furosemide (LASIX) 20 MG tablet [id number] Yes Take 1 tablet (20 mg total) by mouth daily as needed for fluid or edema. Donato Schultz, DO Taking Active   furosemide (LASIX) 40 MG tablet [id number] Yes Take 1 tablet (40 mg total) by mouth daily. Seabron Spates R, DO Taking Active   glucose blood (ACCU-CHEK GUIDE) test strip [id number] Yes USE TO TEST BLOOD SUGAR UP TO 4 TIMES DAILY AS INSTRUCTED Donato Schultz, DO Taking Active   Nebivolol HCl 20 MG TABS [id number] Yes Take 1 tablet (20 mg total) by mouth daily. Donato Schultz, DO Taking Active   NONFORMULARY OR COMPOUNDED ITEM [id number] Yes Compression socks  20-30 mm/hg #1  as directed Donato Schultz, DO Taking Active   POTASSIUM GLUCONATE PO [id number] Yes Take by mouth. Per patient taking 650 mg tablet daily [provider] Taking Active   traZODone (DESYREL) 50 MG tablet [id number] Yes Take 0.5-1 tablets (25-50 mg total) by mouth at bedtime as needed for sleep. Seabron Spates R, DO Taking Active   valsartan (DIOVAN) 320 MG tablet [id number] Yes Take 1 tablet (320 mg total) by mouth daily. Donato Schultz, DO Taking Active   Vitamin D, Ergocalciferol, (DRISDOL) 1.25 MG (50000 UNIT) CAPS capsule [id number] Yes Take 1 capsule (50,000 Units total) by mouth every 7 (seven) days. Seabron Spates R, DO Taking Active   warfarin (COUMADIN) 5 MG tablet [id number] Yes TAKE 1 TABLET BY MOUTH DAILY EXCEPT TAKE 1/2 TABLET ON TUESDAYS, THURSDAY, SATURDAYS OR AS  DIRECTED BY ANTICOAGULATION CLINIC Donato Schultz, DO Taking Active             Home Care and Equipment/Supplies: Were Home Health Services Ordered?: No Any new equipment or medical supplies ordered?: No  Functional Questionnaire: Do you need assistance with bathing/showering or  dressing?: No Do you need assistance with meal preparation?: No Do you need assistance with eating?: No Do you have difficulty maintaining continence: No Do you need assistance with getting out of bed/getting out of a chair/moving?: No Do you have difficulty managing or taking your medications?: No  Follow up appointments reviewed: PCP Follow-up appointment confirmed?: Yes Date of PCP follow-up appointment?: 08/10/22 Follow-up Provider: Dr. Laury Axon Specialist [facility] Follow-up appointment confirmed?: NA Do you need transportation to your follow-up appointment?: No Do you understand care options if your condition(s) worsen?: Yes-patient verbalized understanding  SDOH Interventions Today    Flowsheet Row Most Recent Value  SDOH Interventions   Food Insecurity Interventions Intervention Not Indicated  Housing Interventions Intervention Not Indicated  Transportation Interventions Intervention Not Indicated     Patient decided to cancel follow up appointment with Dr. Laury Axon as she has spent a lot of money on doctors and ED visit. Patient was taking antibiotic for a dermatology procedure and she feels the chest pain was from the antibiotic.  She feels very good at this time.  Jodelle Gross, RN, BSN, CCM Care Management Coordinator [facility]

## 2022-08-10 ENCOUNTER — Ambulatory Visit: Payer: Medicare PPO | Admitting: Family Medicine

## 2022-08-22 ENCOUNTER — Ambulatory Visit: Payer: Medicare PPO

## 2022-08-22 DIAGNOSIS — Z7901 Long term (current) use of anticoagulants: Secondary | ICD-10-CM | POA: Diagnosis not present

## 2022-08-22 LAB — POCT INR: INR: 2 (ref 2.0–3.0)

## 2022-08-22 NOTE — Progress Notes (Addendum)
ER on 7/3 for precordial chest pain. Pt is reporting feeling better today. Pt was also prescribed cefdinir by dermatology since her last coumadin clinic apt. Pt is not currently taking it. Continue 1 tablet daily except take 1/2 tablet on Tuesdays, Thursdays and Saturdays. Recheck in 7 weeks.

## 2022-08-22 NOTE — Patient Instructions (Addendum)
Pre visit review using our clinic review tool, if applicable. No additional management support is needed unless otherwise documented below in the visit note.  Continue 1 tablet daily except take 1/2 tablet on Tuesdays, Thursdays and Saturdays. Recheck in 7 weeks.

## 2022-09-06 DIAGNOSIS — Z85828 Personal history of other malignant neoplasm of skin: Secondary | ICD-10-CM | POA: Diagnosis not present

## 2022-09-06 DIAGNOSIS — L570 Actinic keratosis: Secondary | ICD-10-CM | POA: Diagnosis not present

## 2022-09-06 DIAGNOSIS — L814 Other melanin hyperpigmentation: Secondary | ICD-10-CM | POA: Diagnosis not present

## 2022-09-06 DIAGNOSIS — D485 Neoplasm of uncertain behavior of skin: Secondary | ICD-10-CM | POA: Diagnosis not present

## 2022-09-06 DIAGNOSIS — L821 Other seborrheic keratosis: Secondary | ICD-10-CM | POA: Diagnosis not present

## 2022-09-06 DIAGNOSIS — Z08 Encounter for follow-up examination after completed treatment for malignant neoplasm: Secondary | ICD-10-CM | POA: Diagnosis not present

## 2022-09-06 DIAGNOSIS — C4442 Squamous cell carcinoma of skin of scalp and neck: Secondary | ICD-10-CM | POA: Diagnosis not present

## 2022-09-07 ENCOUNTER — Ambulatory Visit: Payer: Medicare PPO | Admitting: Family Medicine

## 2022-09-11 ENCOUNTER — Ambulatory Visit: Payer: Medicare PPO | Admitting: Family Medicine

## 2022-09-11 ENCOUNTER — Encounter: Payer: Self-pay | Admitting: Family Medicine

## 2022-09-11 VITALS — BP 152/90 | HR 66 | Temp 97.9°F | Resp 18 | Ht 63.0 in | Wt 168.2 lb

## 2022-09-11 DIAGNOSIS — I10 Essential (primary) hypertension: Secondary | ICD-10-CM | POA: Diagnosis not present

## 2022-09-11 MED ORDER — AMLODIPINE BESYLATE 5 MG PO TABS
5.0000 mg | ORAL_TABLET | Freq: Every day | ORAL | 1 refills | Status: DC
Start: 2022-09-11 — End: 2023-03-15

## 2022-09-11 NOTE — Patient Instructions (Signed)

## 2022-09-11 NOTE — Progress Notes (Signed)
Established Patient Office Visit  Subjective   Patient ID: [name] Little, female    DOB: 11-29-1933  Age: 89 y.o. MRN: [id number]  Chief Complaint  Patient presents with   Hypertension   Follow-up    HPI Discussed the use of AI scribe software for clinical note transcription with the patient, who gave verbal consent to proceed.  History of Present Illness   The patient presents with ongoing high blood pressure, despite reporting that it is 'pretty good' at home. She is under significant stress due to worsening cognitive issues in their spouse, which has escalated from sundowning to 'twenty four seven.' The patient is the primary caregiver and reports losing sleep due to their spouse's condition. They have a neurology appointment scheduled for their spouse, but are unsure if they will be able to convince them to attend.  The patient also reports a recent skin cancer diagnosis, for which they have had surgery three times. She is currently awaiting biopsy results. They report taking precautions such as wearing a hat and long sleeves when outside.  In addition, the patient had a recent adverse reaction to the antibiotic cefdinir, which was prescribed by their dermatologist. The reaction included body aches, a rash, and itching.      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   Hematuria 01/29/2018   Hand injury 10/27/2017   Laceration of right hand with infection 10/26/2017   Kidney stone 09/17/2017   Aortic atherosclerosis (HCC) 08/18/2017   Mitral valve  regurgitation 09/01/2016   History of UTI 08/22/2016   Lower extremity edema 08/22/2016   BCC (basal cell carcinoma), scalp/neck 09/27/2015   Pain in limb 05/30/2015   Low back pain 03/26/2014   Diabetes mellitus type 2 in obese 02/26/2014   Nocturia 02/26/2014   Obesity (BMI 30-39.9) 02/03/2014   Anxiety state 09/22/2013   FH: factor V Leiden mutation 08/19/2013   Preventative health care 04/02/2013   Encounter for therapeutic drug monitoring 03/26/2013   Anticoagulant long-term use 08/23/2010   CALLUS, TOE 01/05/2010   CHEST PAIN 08/23/2009   Malignant neoplasm of female breast (HCC) 07/27/2009   PHLEBITIS&THROMBOPHLEB SUP VESSELS LOWER EXTREM 12/11/2007   HEMORRHOIDS 12/09/2007   COLONIC POLYPS, HX OF 12/09/2007   Essential hypertension 09/18/2007   Venous (peripheral) insufficiency 07/02/2007   Hyperlipidemia 06/04/2007   Paroxysmal supraventricular tachycardia 03/28/2007   Aneurysm of thoracic aorta (HCC) 11/25/2006   DVT, HX OF 08/11/2006   Past Medical History:  Diagnosis Date   Anticoagulated on Coumadin    long-term for factor V---  managed by coumadin clinic and pcp   Diet-controlled type 2 diabetes mellitus (HCC)    followed by pcp   Factor V deficiency (HCC) 1998   anticoagulated on coumadin   GAD (generalized anxiety disorder)    Heart murmur    Hemorrhoids    History of avascular necrosis of capital femoral epiphysis 1999   s/p THA right   History of basal cell carcinoma (BCC) excision    scalp/ neck   History of Clostridium difficile infection    History of DVT of lower extremity  1998;  04/ 2006   LLE   History of external beam radiation therapy 2010   right breast cancer   History of ITP    per pt hx long-term use prednisone for ITP,  1998 s/p total splenctomy   History of kidney stones    History of melanoma 1980s   s/p  right calf melanoma excision , per pt not malignant and localized ,  no recurrence   History of paroxysmal supraventricular  tachycardia    (02-23-2020  pt stated have not had any issues / symptoms in several years   History of pulmonary embolus (PE) 04/2004   History of right breast cancer 2010   08-10-2008 s/p  right lumpectomy w/ sln bx (partial mastectomy) , ER+, Stage I,  completed radiation 2010,  per pt no recurrance   Hypertension    Macular degeneration, bilateral    mild   Mitral valve regurgitation 09/01/2016   cardiology-- dr Eden Emms--- per note mild to moderate without stenosis,     Mixed hyperlipidemia 05/18/2015   Nocturia    OA (osteoarthritis)    lumbar stenosis, radiculopathy, OA- L hip   Personal history of colonic polyps    hyperplastic   Renal calculus, right    Right ureteral calculus    Thoracic ascending aortic aneurysm (HCC)    last CT angio chest in epic 11-29-2016  , 3.7cm   Past Surgical History:  Procedure Laterality Date   ABDOMINAL HYSTERECTOMY  1973   BREAST LUMPECTOMY WITH RADIOACTIVE SEED AND SENTINEL LYMPH NODE BIOPSY Right 08/10/2008   @[facility]    CATARACT EXTRACTION W/ INTRAOCULAR LENS  IMPLANT, BILATERAL Bilateral 2001   CYSTOSCOPY WITH RETROGRADE PYELOGRAM, URETEROSCOPY AND STENT PLACEMENT Right 02/25/2020   Procedure: CYSTOSCOPY WITH RETROGRADE PYELOGRAM, URETEROSCOPY AND STENT EXCHANGE;  Surgeon: Sebastian Ache, MD;  Location: [facility];  Service: Urology;  Laterality: Right;  75 MINS   CYSTOSCOPY WITH STENT PLACEMENT Right 01/27/2020   Procedure: CYSTOSCOPY WITH STENT PLACEMENT;  Surgeon: Sebastian Ache, MD;  Location: [facility];  Service: Urology;  Laterality: Right;   CYSTOSCOPY/URETEROSCOPY/HOLMIUM LASER/STENT PLACEMENT Left 06-27-2018  dr Logan Bores,  @[facility]    HOLMIUM LASER APPLICATION Right 02/25/2020   Procedure: HOLMIUM LASER APPLICATION;  Surgeon: Sebastian Ache, MD;  Location[facility];  Service: Urology;  Laterality: Right;   I & D EXTREMITY Right 10/26/2017   Procedure: IRRIGATION AND DEBRIDEMENT RIGHT HAND;  Surgeon: Bradly Bienenstock, MD;  Location: [facility] OR;  Service: Orthopedics;  Laterality: Right;   LUMBAR LAMINECTOMY/DECOMPRESSION MICRODISCECTOMY  04/11/2011   Procedure: LUMBAR LAMINECTOMY/DECOMPRESSION MICRODISCECTOMY;  Surgeon: Barnett Abu, MD;  Location: [facility];  Service: Neurosurgery;  Laterality: N/A;  Lumbar Five-Sacral One Microdiscectomy   MELANOMA EXCISION Right 1980s   right calf   SPLENECTOMY, TOTAL  1998   TOTAL HIP ARTHROPLASTY Right 1999   UPPER GASTROINTESTINAL ENDOSCOPY     Social History   Tobacco Use   Smoking status: Never   Smokeless tobacco: Never  Vaping Use   Vaping status: Never Used  Substance Use Topics   Alcohol use: No    Alcohol/week: 0.0 standard drinks of alcohol   Drug use: Never   Social History   Socioeconomic History   Marital status: Married    Spouse name: Not on file   Number of children: 4   Years of education: Not on file   Highest education level: Not on file  Occupational History   Occupation: Retired  Tobacco Use   Smoking status:  Never   Smokeless tobacco: Never  Vaping Use   Vaping status: Never Used  Substance and Sexual Activity   Alcohol use: No    Alcohol/week: 0.0 standard drinks of alcohol   Drug use: Never   Sexual activity: Not on file  Other Topics Concern   Not on file  Social History Narrative   Married 1955   2 sons - '59, '61, 2 daughters '55, '57; 8 grandchildren; 1 great grand   I-ADLs      Social Determinants of Health   Financial Resource Strain: Not on file  Food Insecurity: No Food Insecurity (08/07/2022)   Hunger Vital Sign    Worried About Running Out of Food in the Last Year: Never true    Ran Out of Food in the Last Year: Never true  Transportation Needs: No Transportation Needs (08/07/2022)   PRAPARE - Administrator, Civil Service (Medical): No    Lack of Transportation (Non-Medical): No  Physical Activity: Not on file  Stress: Not on file  Social Connections: Not on file  Intimate Partner  Violence: Not on file   Family Status  Relation Name Status   Mother  Deceased   Father  Deceased   Sister 109 Deceased   Brother 85 Deceased       BLOOD CLOT IN BRAIN   Sister 25 Deceased   Other  (Not Specified)   Mat Uncle  (Not Specified)   Oceanographer  (Not Specified)   Sister 62 Alive   Neg Hx  (Not Specified)  No partnership data on file   Family History  Problem Relation Age of Onset   Heart disease Mother    Heart disease Father    Alzheimer's disease Sister    Dementia Sister    Dementia Sister    Heart disease Other        uncles   Clotting disorder Maternal Uncle    Breast cancer Paternal Aunt    Cancer Paternal Aunt        breast   Stroke Sister    Dementia Sister        mean, carotid artery disease   Colon cancer Neg Hx    Anesthesia problems Neg Hx    Hypotension Neg Hx    Malignant hyperthermia Neg Hx    Pseudochol deficiency Neg Hx    Allergies  Allergen Reactions   Amlodipine Swelling    "my whole body swelled"   Carvedilol Swelling    "my whole body swelled"   Chlorthalidone     Patient reported extreme weakness.   Hydralazine Other (See Comments)    Joint and chest pain   Statins Other (See Comments)    Weakness, pain Lipitor and Pravachol Weakness, pain Lipitor and Pravachol   Zetia [Ezetimibe]     Unable to walk, severe leg pain   Cefdinir     On blood thinnner   Lyrica [Pregabalin] Other (See Comments)    Weakness and joint pain   Cefuroxime Axetil     REACTION: Ddoes not remember reaction   Sulfonamide Derivatives Rash    REACTION: Rash      Review of Systems  Constitutional:  Negative for fever and malaise/fatigue.  HENT:  Negative for congestion.   Eyes:  Negative for blurred vision.  Respiratory:  Negative for cough and shortness of breath.   Cardiovascular:  Negative for chest pain, palpitations and leg swelling.  Gastrointestinal:  Negative for vomiting.  Musculoskeletal:  Negative for  back pain.  Skin:  Negative for  rash.  Neurological:  Negative for loss of consciousness and headaches.      Objective:     BP (!) 152/90 (BP Location: Left Arm, Patient Position: Sitting, Cuff Size: Normal)   Pulse 66   Temp 97.9 F (36.6 C) (Oral)   Resp 18   Ht 5\' 3"  (1.6 m)   Wt 168 lb 3.2 oz (76.3 kg)   SpO2 98%   BMI 29.80 kg/m  BP Readings from Last 3 Encounters:  09/11/22 (!) 152/90  08/02/22 (!) 135/103  07/07/22 (!) 200/78   Wt Readings from Last 3 Encounters:  09/11/22 168 lb 3.2 oz (76.3 kg)  08/02/22 163 lb (73.9 kg)  07/07/22 167 lb 6.4 oz (75.9 kg)   SpO2 Readings from Last 3 Encounters:  09/11/22 98%  08/02/22 93%  07/07/22 91%      Physical Exam Vitals and nursing note reviewed.  Constitutional:      General: She is not in acute distress.    Appearance: Normal appearance. She is well-developed.  HENT:     Head: Normocephalic and atraumatic.  Eyes:     General: No scleral icterus.       Right eye: No discharge.        Left eye: No discharge.  Cardiovascular:     Rate and Rhythm: Normal rate and regular rhythm.     Heart sounds: No murmur heard. Pulmonary:     Effort: Pulmonary effort is normal. No respiratory distress.     Breath sounds: Normal breath sounds.  Musculoskeletal:        General: Normal range of motion.     Cervical back: Normal range of motion and neck supple.     Right lower leg: No edema.     Left lower leg: No edema.  Skin:    General: Skin is warm and dry.  Neurological:     Mental Status: She is alert and oriented to person, place, and time.  Psychiatric:        Mood and Affect: Mood normal.        Behavior: Behavior normal.        Thought Content: Thought content normal.        Judgment: Judgment normal.      No results found for any visits on 09/11/22.  Last CBC Lab Results  Component Value Date   WBC 10.5 08/02/2022   HGB 16.0 (H) 08/02/2022   HCT 50.2 (H) 08/02/2022   MCV 91.9 08/02/2022   MCH 29.3 08/02/2022   RDW 14.9 08/02/2022    PLT 359 08/02/2022   Last metabolic panel Lab Results  Component Value Date   GLUCOSE 148 (H) 08/02/2022   NA 139 08/02/2022   K 3.8 08/02/2022   CL 105 08/02/2022   CO2 24 08/02/2022   BUN 28 (H) 08/02/2022   CREATININE 0.97 08/02/2022   GFRNONAA 57 (L) 08/02/2022   CALCIUM 8.8 (L) 08/02/2022   PHOS 3.0 11/19/2013   PROT 7.1 08/02/2022   ALBUMIN 3.6 08/02/2022   BILITOT 0.5 08/02/2022   ALKPHOS 65 08/02/2022   AST 23 08/02/2022   ALT 16 08/02/2022   ANIONGAP 10 08/02/2022   Last lipids Lab Results  Component Value Date   CHOL 154 04/24/2022   HDL 34.40 (L) 04/24/2022   LDLCALC 86 04/24/2022   LDLDIRECT 144.0 02/09/2022   TRIG 167.0 (H) 04/24/2022   CHOLHDL 4 04/24/2022   Last hemoglobin A1c Lab Results  Component Value Date   HGBA1C 6.7 (H) 04/24/2022   Last thyroid functions Lab Results  Component Value Date   TSH 3.57 04/24/2022   Last vitamin D Lab Results  Component Value Date   VD25OH 26.23 (L) 07/14/2021   Last vitamin B12 and Folate Lab Results  Component Value Date   VITAMINB12 622 02/09/2022   FOLATE 15.9 02/04/2008      The ASCVD Risk score (Arnett DK, et al., 2019) failed to calculate for the following reasons:   The 2019 ASCVD risk score is only valid for ages 86 to 47    Assessment & Plan:   Problem List Items Addressed This Visit       Unprioritized   Essential hypertension - Primary    Poorly controlled will alter medications, encouraged DASH diet, minimize caffeine and obtain adequate sleep. Report concerning symptoms and follow up as directed and as needed  Con't bystolic and diovan      Relevant Medications   amLODipine (NORVASC) 5 MG tablet   Assessment and Plan    Hypertension Elevated blood pressure readings at home and in the office. Discussed the importance of medication adherence and dietary modifications. -Added a new antihypertensive medication as per Dr. Gwynneth Macleod recommendation. -Check blood pressure at home  and follow up in 3 months.  Cognitive Impairment Worsening cognitive symptoms, now present 24/7. Neurology referral already in place. -Neurology appointment scheduled for 09/20/2022. -Will have a Facilities manager from [employer] contact the patient to discuss possible support options.  Allergic Reaction to Cefdinir Reported systemic reaction with rash and generalized discomfort after taking Cefdinir prescribed by dermatologist. -Added Cefdinir to allergy list. -Avoid Cefdinir in the future.  Skin Cancer History of multiple surgeries for skin cancer. Currently awaiting biopsy results from dermatologist. -Continue to wear hat and long sleeves when outside for sun protection. -Follow up with dermatologist as needed.       Return in about 3 months (around 12/12/2022), or if symptoms worsen or fail to improve, for annual exam, fasting.    Donato Schultz, DO

## 2022-09-11 NOTE — Assessment & Plan Note (Signed)
Poorly controlled will alter medications, encouraged DASH diet, minimize caffeine and obtain adequate sleep. Report concerning symptoms and follow up as directed and as needed  Con't bystolic and diovan

## 2022-09-14 ENCOUNTER — Encounter (INDEPENDENT_AMBULATORY_CARE_PROVIDER_SITE_OTHER): Payer: Self-pay

## 2022-09-21 ENCOUNTER — Other Ambulatory Visit: Payer: Self-pay | Admitting: Family Medicine

## 2022-09-21 ENCOUNTER — Telehealth: Payer: Self-pay

## 2022-09-21 DIAGNOSIS — Z7901 Long term (current) use of anticoagulants: Secondary | ICD-10-CM

## 2022-09-21 NOTE — Telephone Encounter (Signed)
Pt called from pharmacy reporting she is out of warfarin. Advised it would be sent in now. Pt verbalized understanding and was appreciative.  Sent in refill under a refill encounter.

## 2022-09-21 NOTE — Telephone Encounter (Signed)
Pt is compliant with warfarin management and PCP apts.  Sent in refill of warfarin to requested pharmacy.   Pt reported she was currently out of warfarin.

## 2022-10-10 ENCOUNTER — Ambulatory Visit: Payer: Medicare PPO

## 2022-10-10 DIAGNOSIS — Z7901 Long term (current) use of anticoagulants: Secondary | ICD-10-CM

## 2022-10-10 LAB — POCT INR: INR: 2.4 (ref 2.0–3.0)

## 2022-10-10 NOTE — Progress Notes (Cosign Needed Addendum)
Continue 1 tablet daily except take 1/2 tablet on Tuesdays, Thursdays and Saturdays. Recheck in 7 weeks per pt request.  Medical screening examination/treatment/procedure(s) were performed by non-physician practitioner and as supervising physician I was immediately available for consultation/collaboration.  I agree with above. Jacinta Shoe, MD

## 2022-10-10 NOTE — Patient Instructions (Addendum)
Pre visit review using our clinic review tool, if applicable. No additional management support is needed unless otherwise documented below in the visit note.  Continue 1 tablet daily except take 1/2 tablet on Tuesdays, Thursdays and Saturdays. Recheck in 7 weeks.

## 2022-10-26 ENCOUNTER — Other Ambulatory Visit: Payer: Self-pay | Admitting: Family Medicine

## 2022-10-26 DIAGNOSIS — Z1231 Encounter for screening mammogram for malignant neoplasm of breast: Secondary | ICD-10-CM

## 2022-11-03 ENCOUNTER — Emergency Department (HOSPITAL_BASED_OUTPATIENT_CLINIC_OR_DEPARTMENT_OTHER): Payer: Medicare PPO

## 2022-11-03 ENCOUNTER — Encounter (HOSPITAL_BASED_OUTPATIENT_CLINIC_OR_DEPARTMENT_OTHER): Payer: Self-pay | Admitting: *Deleted

## 2022-11-03 ENCOUNTER — Other Ambulatory Visit: Payer: Self-pay

## 2022-11-03 ENCOUNTER — Observation Stay (HOSPITAL_BASED_OUTPATIENT_CLINIC_OR_DEPARTMENT_OTHER)
Admission: EM | Admit: 2022-11-03 | Discharge: 2022-11-05 | Disposition: A | Discharge status: Home / Self Care | Payer: Medicare PPO | Attending: Internal Medicine | Admitting: Internal Medicine

## 2022-11-03 ENCOUNTER — Telehealth: Payer: Self-pay | Admitting: Family Medicine

## 2022-11-03 DIAGNOSIS — K625 Hemorrhage of anus and rectum: Secondary | ICD-10-CM | POA: Diagnosis present

## 2022-11-03 DIAGNOSIS — K5730 Diverticulosis of large intestine without perforation or abscess without bleeding: Secondary | ICD-10-CM | POA: Diagnosis not present

## 2022-11-03 DIAGNOSIS — E119 Type 2 diabetes mellitus without complications: Secondary | ICD-10-CM | POA: Insufficient documentation

## 2022-11-03 DIAGNOSIS — E669 Obesity, unspecified: Secondary | ICD-10-CM | POA: Diagnosis not present

## 2022-11-03 DIAGNOSIS — Z86711 Personal history of pulmonary embolism: Secondary | ICD-10-CM | POA: Diagnosis not present

## 2022-11-03 DIAGNOSIS — Z96641 Presence of right artificial hip joint: Secondary | ICD-10-CM | POA: Insufficient documentation

## 2022-11-03 DIAGNOSIS — Z7901 Long term (current) use of anticoagulants: Secondary | ICD-10-CM | POA: Diagnosis not present

## 2022-11-03 DIAGNOSIS — Z86718 Personal history of other venous thrombosis and embolism: Secondary | ICD-10-CM | POA: Insufficient documentation

## 2022-11-03 DIAGNOSIS — N200 Calculus of kidney: Secondary | ICD-10-CM | POA: Diagnosis not present

## 2022-11-03 DIAGNOSIS — E782 Mixed hyperlipidemia: Secondary | ICD-10-CM | POA: Diagnosis not present

## 2022-11-03 DIAGNOSIS — E1169 Type 2 diabetes mellitus with other specified complication: Secondary | ICD-10-CM | POA: Diagnosis not present

## 2022-11-03 DIAGNOSIS — Z853 Personal history of malignant neoplasm of breast: Secondary | ICD-10-CM | POA: Insufficient documentation

## 2022-11-03 DIAGNOSIS — D682 Hereditary deficiency of other clotting factors: Secondary | ICD-10-CM | POA: Diagnosis not present

## 2022-11-03 DIAGNOSIS — I10 Essential (primary) hypertension: Secondary | ICD-10-CM | POA: Diagnosis not present

## 2022-11-03 DIAGNOSIS — K922 Gastrointestinal hemorrhage, unspecified: Secondary | ICD-10-CM | POA: Diagnosis not present

## 2022-11-03 DIAGNOSIS — Z85828 Personal history of other malignant neoplasm of skin: Secondary | ICD-10-CM | POA: Diagnosis not present

## 2022-11-03 DIAGNOSIS — E785 Hyperlipidemia, unspecified: Secondary | ICD-10-CM | POA: Diagnosis present

## 2022-11-03 DIAGNOSIS — K838 Other specified diseases of biliary tract: Secondary | ICD-10-CM | POA: Diagnosis not present

## 2022-11-03 DIAGNOSIS — K8020 Calculus of gallbladder without cholecystitis without obstruction: Secondary | ICD-10-CM | POA: Diagnosis not present

## 2022-11-03 LAB — BASIC METABOLIC PANEL
Anion gap: 10 (ref 5–15)
BUN: 21 mg/dL (ref 8–23)
CO2: 26 mmol/L (ref 22–32)
Calcium: 8.7 mg/dL — ABNORMAL LOW (ref 8.9–10.3)
Chloride: 105 mmol/L (ref 98–111)
Creatinine, Ser: 0.74 mg/dL (ref 0.44–1.00)
GFR, Estimated: >60 mL/min (ref >60)
Glucose, Bld: 111 mg/dL — ABNORMAL HIGH (ref 70–99)
Potassium: 3.8 mmol/L (ref 3.5–5.1)
Sodium: 141 mmol/L (ref 135–145)

## 2022-11-03 LAB — CBC
HCT: 41.5 % (ref 36.0–46.0)
Hemoglobin: 13.5 g/dL (ref 12.0–15.0)
MCH: 29.7 pg (ref 26.0–34.0)
MCHC: 32.5 g/dL (ref 30.0–36.0)
MCV: 91.2 fL (ref 80.0–100.0)
Platelets: 308 10*3/uL (ref 150–400)
RBC: 4.55 MIL/uL (ref 3.87–5.11)
RDW: 15 % (ref 11.5–15.5)
WBC: 7.2 10*3/uL (ref 4.0–10.5)
nRBC: 0 % (ref 0.0–0.2)

## 2022-11-03 LAB — PROTIME-INR
INR: 2.1 — ABNORMAL HIGH (ref 0.8–1.2)
Prothrombin Time: 23.4 s — ABNORMAL HIGH (ref 11.4–15.2)

## 2022-11-03 MED ORDER — NEBIVOLOL HCL 10 MG PO TABS
20.0000 mg | ORAL_TABLET | Freq: Every day | ORAL | Status: DC
  Administered 2022-11-03 – 2022-11-05 (×3): 20 mg via ORAL
  Filled 2022-11-03 (×3): qty 2

## 2022-11-03 MED ORDER — IOHEXOL 350 MG/ML SOLN
100.0000 mL | Freq: Once | INTRAVENOUS | Status: AC | PRN
  Administered 2022-11-03: 100 mL via INTRAVENOUS

## 2022-11-03 MED ORDER — IRBESARTAN 150 MG PO TABS
300.0000 mg | ORAL_TABLET | Freq: Every day | ORAL | Status: DC
  Administered 2022-11-04 – 2022-11-05 (×2): 300 mg via ORAL
  Filled 2022-11-03 (×2): qty 2

## 2022-11-03 MED ORDER — AMLODIPINE BESYLATE 5 MG PO TABS
5.0000 mg | ORAL_TABLET | Freq: Every day | ORAL | Status: DC
  Administered 2022-11-03 – 2022-11-05 (×3): 5 mg via ORAL
  Filled 2022-11-03 (×3): qty 1

## 2022-11-03 MED ORDER — SODIUM CHLORIDE 0.9 % IV SOLN: INTRAVENOUS | Status: AC

## 2022-11-03 NOTE — Telephone Encounter (Signed)
Initial Comment Caller says her grandmother has blood in her stool. Translation No Nurse Assessment Nurse: Elesa Hacker, RN, Nash Dimmer Date/Time (Eastern Time): 11/03/2022 8:33:52 AM Confirm and document reason for call. If symptomatic, describe symptoms. ---Granddaughter advised that grandmother has bright red blood in her stool when having a bowel movement. Caller is on Coumadin , treating blood clots. Does the patient have any new or worsening symptoms? ---Yes Will a triage be completed? ---Yes Related visit to physician within the last 2 weeks? ---No Does the PT have any chronic conditions? (i.e. diabetes, asthma, this includes High risk factors for pregnancy, etc.) ---Yes List chronic conditions. ---HTN on Coumadin for years for blood clots Is this a behavioral health or substance abuse call? ---No Guidelines Guideline Title Affirmed Question Affirmed Notes Nurse Date/Time (Eastern Time) Rectal Bleeding SEVERE rectal bleeding (e.g., large blood clots; constant or on and off bleeding) Deaton, RN, Nash Dimmer 11/03/2022 8:35:26 AM PLEASE NOTE: All timestamps contained within this report are represented as [place] Standard Time. CONFIDENTIALTY NOTICE: This fax transmission is intended only for the addressee. It contains information that is legally privileged, confidential or otherwise protected from use or disclosure. If you are not the intended recipient, you are strictly prohibited from reviewing, disclosing, copying using or disseminating any of this information or taking any action in reliance on or regarding this information. If you have received this fax in error, please notify us immediately by telephone so that we can arrange for its return to [place]. Phone: [phone], Toll-Free: [phone], Fax: [phone] Page: 2 of 2 Call Id: [id number] Disp. Time Lamount Cohen Time) Disposition Final User 11/03/2022 8:38:17 AM Go to ED Now Yes Deaton, RN, Nash Dimmer Final Disposition 11/03/2022 8:38:17 AM Go  to ED Now Yes Deaton, RN, Cory Roughen Disagree/Comply Comply Caller Understands Yes PreDisposition Did not know what to do Care Advice Given Per Guideline GO TO ED NOW: * You need to be seen in the Emergency Department. * Go to the ED at ___________ Hospital. * Leave now. Drive carefully. NOTE TO TRIAGER - DRIVING: * Another adult should drive. * Patient should not delay going to the emergency department. CARE ADVICE given per Rectal Bleeding (Adult) guideline. BRING MEDICINES: Referrals [facility] - Ed

## 2022-11-03 NOTE — H&P (Signed)
History and Physical    Patient: [name] Little WNU:[id number] DOB: 1933/04/25 DOA: 11/03/2022 DOS: the patient was seen and examined on 11/03/2022 PCP: Donato Schultz, DO  Patient coming from: Home  Chief Complaint:  Chief Complaint  Patient presents with   Rectal Bleeding   HPI: [name] Little is a 89 y.o. female with medical history significant of factor V Leiden deficiency on warfarin, history of PE and recurrent DVT, hypertension, hyperlipidemia, type 2 diabetes, osteoarthritis, s/p splenectomy secondary to ITP who presents with rectal bleeding.  Patient reports that earlier this morning around 8 AM she was urinating and then noticed the toilet was full of blood.  Felt left lower abdominal pain when she palpated it.  No hemorrhoids.  She denies any history of GI bleed.  She is on warfarin for factor V Leiden deficiency and had last dose yesterday on 10/3.  Denies any nausea, vomiting, diarrhea constipation.  Remote colonoscopy in 2009 without significant findings.  In the ED, she was afebrile, heart rate ranged from 50 to 60s, BP of 179/82 on room air.  CBC without leukocytosis, hemoglobin of 13.5 with prior around 14.  INR of 2.1.  BMP otherwise unremarkable.  [facility] was consulted by ED physician will see in evaluation in the morning.  They recommended obtaining CT angio.  CT angio of the abdomen pelvis was obtained which did not show any acute extravasation.  There is sigmoid diverticulosis without acute diverticulitis.  Gallstones and sludge in the gallbladder.  Subacute cholecystitis.  Small nonobstructive right renal calculi.  Review of Systems: As mentioned in the history of present illness. All other systems reviewed and are negative. Past Medical History:  Diagnosis Date   Anticoagulated on Coumadin    long-term for factor V---  managed by coumadin clinic and pcp   Diet-controlled type 2 diabetes mellitus (HCC)    followed by pcp   Factor V  deficiency (HCC) 1998   anticoagulated on coumadin   GAD (generalized anxiety disorder)    Heart murmur    Hemorrhoids    History of avascular necrosis of capital femoral epiphysis 1999   s/p THA right   History of basal cell carcinoma (BCC) excision    scalp/ neck   History of Clostridium difficile infection    History of DVT of lower extremity 1998;  04/ 2006   LLE   History of external beam radiation therapy 2010   right breast cancer   History of ITP    per pt hx long-term use prednisone for ITP,  1998 s/p total splenctomy   History of kidney stones    History of melanoma 1980s   s/p  right calf melanoma excision , per pt not malignant and localized ,  no recurrence   History of paroxysmal supraventricular tachycardia    (02-23-2020  pt stated have not had any issues / symptoms in several years   History of pulmonary embolus (PE) 04/2004   History of right breast cancer 2010   08-10-2008 s/p  right lumpectomy w/ sln bx (partial mastectomy) , ER+, Stage I,  completed radiation 2010,  per pt no recurrance   Hypertension    Macular degeneration, bilateral    mild   Mitral valve regurgitation 09/01/2016   cardiology-- dr Eden Emms--- per note mild to moderate without stenosis,     Mixed hyperlipidemia 05/18/2015   Nocturia    OA (osteoarthritis)    lumbar stenosis, radiculopathy, OA- L hip   Personal history of  colonic polyps    hyperplastic   Renal calculus, right    Right ureteral calculus    Thoracic ascending aortic aneurysm (HCC)    last CT angio chest in epic 11-29-2016  , 3.7cm   Past Surgical History:  Procedure Laterality Date   ABDOMINAL HYSTERECTOMY  1973   BREAST LUMPECTOMY WITH RADIOACTIVE SEED AND SENTINEL LYMPH NODE BIOPSY Right 08/10/2008   @[facility]    CATARACT EXTRACTION W/ INTRAOCULAR LENS  IMPLANT, BILATERAL Bilateral 2001   CYSTOSCOPY WITH RETROGRADE PYELOGRAM, URETEROSCOPY AND STENT PLACEMENT Right 02/25/2020   Procedure: CYSTOSCOPY WITH RETROGRADE PYELOGRAM,  URETEROSCOPY AND STENT EXCHANGE;  Surgeon: Sebastian Ache, MD;  Location: [facility];  Service: Urology;  Laterality: Right;  75 MINS   CYSTOSCOPY WITH STENT PLACEMENT Right 01/27/2020   Procedure: CYSTOSCOPY WITH STENT PLACEMENT;  Surgeon: Sebastian Ache, MD;  Location: [facility];  Service: Urology;  Laterality: Right;   CYSTOSCOPY/URETEROSCOPY/HOLMIUM LASER/STENT PLACEMENT Left 06-27-2018  dr Logan Bores,  @[facility]    HOLMIUM LASER APPLICATION Right 02/25/2020   Procedure: HOLMIUM LASER APPLICATION;  Surgeon: Sebastian Ache, MD;  Location: [facility];  Service: Urology;  Laterality: Right;   I & D EXTREMITY Right 10/26/2017   Procedure: IRRIGATION AND DEBRIDEMENT RIGHT HAND;  Surgeon: Bradly Bienenstock, MD;  Location: [facility];  Service: Orthopedics;  Laterality: Right;   LUMBAR LAMINECTOMY/DECOMPRESSION MICRODISCECTOMY  04/11/2011   Procedure: LUMBAR LAMINECTOMY/DECOMPRESSION MICRODISCECTOMY;  Surgeon: Barnett Abu, MD;  Location: [facility];  Service: Neurosurgery;  Laterality: N/A;  Lumbar Five-Sacral One Microdiscectomy   MELANOMA EXCISION Right 1980s   right calf   SPLENECTOMY, TOTAL  1998   TOTAL HIP ARTHROPLASTY Right 1999   UPPER GASTROINTESTINAL ENDOSCOPY     Social History:  reports that she has never smoked. She has never used smokeless tobacco. She reports that she does not drink alcohol and does not use drugs.  Allergies  Allergen Reactions   Amlodipine Swelling    "my whole body swelled"   Carvedilol Swelling    "my whole body swelled"   Chlorthalidone     Patient reported extreme weakness.   Hydralazine Other (See Comments)    Joint and chest pain   Statins Other (See Comments)    Weakness, pain Lipitor and Pravachol Weakness, pain Lipitor and Pravachol   Zetia [Ezetimibe]     Unable to walk, severe leg pain   Cefdinir     On blood thinnner   Lyrica [Pregabalin] Other (See Comments)    Weakness and joint pain   Cefuroxime Axetil     REACTION:  Ddoes not remember reaction   Sulfonamide Derivatives Rash    REACTION: Rash    Family History  Problem Relation Age of Onset   Heart disease Mother    Heart disease Father    Alzheimer's disease Sister    Dementia Sister    Dementia Sister    Heart disease Other        uncles   Clotting disorder Maternal Uncle    Breast cancer Paternal Aunt    Cancer Paternal Aunt        breast   Stroke Sister    Dementia Sister        mean, carotid artery disease   Colon cancer Neg Hx    Anesthesia problems Neg Hx    Hypotension Neg Hx    Malignant hyperthermia Neg Hx    Pseudochol deficiency Neg Hx     Prior to Admission medications   Medication Sig Start  Date End Date Taking? Authorizing Provider  Accu-Chek Softclix Lancets lancets Use to check sugar daily or as needed. 11/07/19   Bradd Canary, MD  amLODipine (NORVASC) 5 MG tablet Take 1 tablet (5 mg total) by mouth daily. 09/11/22   Donato Schultz, DO  B Complex Vitamins (VITAMIN B COMPLEX PO) Take 1 tablet by mouth daily.     [provider]  blood glucose meter kit and supplies KIT Dispense based on patient and insurance preference. Use up to four times daily as directed. Dx: E11.9 07/06/20   Donato Schultz, DO  Blood Pressure Monitoring (BLOOD PRESSURE KIT) DEVI 1 Device by Does not apply route daily as needed. Fill per patient insurance preference Dx: Hypertension 01/29/18   Bradd Canary, MD  Cranberry 1000 MG CAPS Take 1 capsule by mouth 2 (two) times daily.    [provider]  Dietary Management Product (TOZAL PO) Take by mouth. Per patient taking everyday eye vitamin    [provider]  furosemide (LASIX) 20 MG tablet Take 1 tablet (20 mg total) by mouth daily as needed for fluid or edema. 01/06/21   Donato Schultz, DO  furosemide (LASIX) 40 MG tablet Take 1 tablet (40 mg total) by mouth daily. 03/17/21   Seabron Spates R, DO  glucose blood (ACCU-CHEK GUIDE) test strip USE TO TEST  BLOOD SUGAR UP TO 4 TIMES DAILY AS INSTRUCTED 08/03/20   Zola Button, Grayling Congress, DO  Nebivolol HCl 20 MG TABS Take 1 tablet (20 mg total) by mouth daily. 06/06/22   Seabron Spates R, DO  NONFORMULARY OR COMPOUNDED ITEM Compression socks  20-30 mm/hg #1  as directed 04/24/22   Zola Button, Grayling Congress, DO  POTASSIUM GLUCONATE PO Take by mouth. Per patient taking 650 mg tablet daily    [provider]  traZODone (DESYREL) 50 MG tablet Take 0.5-1 tablets (25-50 mg total) by mouth at bedtime as needed for sleep. 05/01/22   Seabron Spates R, DO  valsartan (DIOVAN) 320 MG tablet Take 1 tablet (320 mg total) by mouth daily. 05/15/22   Donato Schultz, DO  Vitamin D, Ergocalciferol, (DRISDOL) 1.25 MG (50000 UNIT) CAPS capsule Take 1 capsule (50,000 Units total) by mouth every 7 (seven) days. 07/21/21   Donato Schultz, DO  warfarin (COUMADIN) 5 MG tablet TAKE 1 TABLET BY MOUTH DAILY EXCEPT TAKE 1/2 TABLET ON TUESDAYS, THURSDAY, SATURDAYS OR AS DIRECTED BY ANTICOAGULATION CLINIC 09/21/22   Donato Schultz, DO    Physical Exam: Vitals:   11/03/22 1742 11/03/22 1900 11/03/22 2011 11/03/22 2058  BP: (!) 178/67 (!) 175/62 (!) 167/65 (!) 172/78  Pulse: (!) 55 (!) 50 61 (!) 58  Resp: 15 13 17 16   Temp: 97.6 F (36.4 C)  98.2 F (36.8 C) 98 F (36.7 C)  TempSrc:    Oral  SpO2: 96% 95% 95% 95%  Weight:      Height:       Constitutional: NAD, calm, comfortable, well-appearing elderly female appearing in his stated age laying flat in bed Eyes: lids and conjunctivae normal ENMT: Mucous membranes are moist.  Neck: normal, supple Respiratory: clear to auscultation bilaterally, no wheezing, no crackles. Normal respiratory effort. No accessory muscle use.  Cardiovascular: Regular rate and rhythm, no murmurs / rubs / gallops. No extremity edema. Abdomen: Soft, nontender, moderately distended abdomen.  No masses palpated.  Musculoskeletal: no clubbing / cyanosis. No joint deformity upper  and lower extremities. Normal muscle tone.  Skin: no rashes, lesions, ulcers. No induration Neurologic: CN 2-12 grossly intact.  Strength 5/5 in all 4.  Psychiatric: Normal judgment and insight. Alert and oriented x 3. Normal and pleasant mood.   Data Reviewed:  See HPI  Assessment and Plan: * Acute lower GI bleeding -Presented with 1 episode of rectal bleeding.  Has not had any more episodes since being in the hospital. Hgb of 13.5 with baseline around 14.  CT angio of the abdomen was negative for any acute extravasation.  There is findings of sigmoid diverticulosis and her bleeding likely is a diverticular bleed and may have already stopped. - Had remote colonoscopy in 2009 without significant findings -[facility] was consulted and will see in consultation -She is on warfarin for factor V Leiden deficiency and INR is currently therapeutic at 2.1.  Last dose was yesterday on 10/3.  Will hold dose today and follow INR and Hgb trend tomorrow. -Obtain Type and screen  Factor V deficiency (HCC) -holding warfarin due to rectal bleeding  Type 2 diabetes mellitus with obesity (HCC) -A1C of 6.7 in March  Essential hypertension -BP is elevated.  Continue home amlodipine, valsartan, nebivolol  Hyperlipidemia - Patient has statin intolerance      Advance Care Planning:Full  Consults: [facility]   Family Communication: Son at bedside  Severity of Illness: The appropriate patient status for this patient is OBSERVATION. Observation status is judged to be reasonable and necessary in order to provide the required intensity of service to ensure the patient's safety. The patient's presenting symptoms, physical exam findings, and initial radiographic and laboratory data in the context of their medical condition is felt to place them at decreased risk for further clinical deterioration. Furthermore, it is anticipated that the patient will be medically stable for discharge from the hospital within  2 midnights of admission.   Author: Anselm Jungling, DO 11/03/2022 10:52 PM  For on call review [email or web address].

## 2022-11-03 NOTE — ED Provider Notes (Signed)
[facility] Provider Note   CSN: [id number] Arrival date & time: 11/03/22  4098     History  Chief Complaint  Patient presents with   Rectal Bleeding    [name] Little is a 89 y.o. female.  Thank   Rectal Bleeding Patient presents with rectal bleeding.  Blood in the stool this morning.  States she does feel little lightheaded.  No other bleeding.  Is on Coumadin for blood clots and factor V deficiency.  No vomiting.  Has had previous colonoscopies but no previous bleeding.  They brought a picture of the toilet that had blood in the water.    Past Medical History:  Diagnosis Date   Anticoagulated on Coumadin    long-term for factor V---  managed by coumadin clinic and pcp   Diet-controlled type 2 diabetes mellitus (HCC)    followed by pcp   Factor V deficiency (HCC) 1998   anticoagulated on coumadin   GAD (generalized anxiety disorder)    Heart murmur    Hemorrhoids    History of avascular necrosis of capital femoral epiphysis 1999   s/p THA right   History of basal cell carcinoma (BCC) excision    scalp/ neck   History of Clostridium difficile infection    History of DVT of lower extremity 1998;  04/ 2006   LLE   History of external beam radiation therapy 2010   right breast cancer   History of ITP    per pt hx long-term use prednisone for ITP,  1998 s/p total splenctomy   History of kidney stones    History of melanoma 1980s   s/p  right calf melanoma excision , per pt not malignant and localized ,  no recurrence   History of paroxysmal supraventricular tachycardia    (02-23-2020  pt stated have not had any issues / symptoms in several years   History of pulmonary embolus (PE) 04/2004   History of right breast cancer 2010   08-10-2008 s/p  right lumpectomy w/ sln bx (partial mastectomy) , ER+, Stage I,  completed radiation 2010,  per pt no recurrance   Hypertension    Macular degeneration, bilateral    mild    Mitral valve regurgitation 09/01/2016   cardiology-- dr Eden Emms--- per note mild to moderate without stenosis,     Mixed hyperlipidemia 05/18/2015   Nocturia    OA (osteoarthritis)    lumbar stenosis, radiculopathy, OA- L hip   Personal history of colonic polyps    hyperplastic   Renal calculus, right    Right ureteral calculus    Thoracic ascending aortic aneurysm (HCC)    last CT angio chest in epic 11-29-2016  , 3.7cm    Home Medications Prior to Admission medications   Medication Sig Start Date End Date Taking? Authorizing Provider  Accu-Chek Softclix Lancets lancets Use to check sugar daily or as needed. 11/07/19   Bradd Canary, MD  amLODipine (NORVASC) 5 MG tablet Take 1 tablet (5 mg total) by mouth daily. 09/11/22   Donato Schultz, DO  B Complex Vitamins (VITAMIN B COMPLEX PO) Take 1 tablet by mouth daily.     [provider]  blood glucose meter kit and supplies KIT Dispense based on patient and insurance preference. Use up to four times daily as directed. Dx: E11.9 07/06/20   Donato Schultz, DO  Blood Pressure Monitoring (BLOOD PRESSURE KIT) DEVI 1 Device by Does not apply route  daily as needed. Fill per patient insurance preference Dx: Hypertension 01/29/18   Bradd Canary, MD  Cranberry 1000 MG CAPS Take 1 capsule by mouth 2 (two) times daily.    [provider]  Dietary Management Product (TOZAL PO) Take by mouth. Per patient taking everyday eye vitamin    [provider]  furosemide (LASIX) 20 MG tablet Take 1 tablet (20 mg total) by mouth daily as needed for fluid or edema. 01/06/21   Donato Schultz, DO  furosemide (LASIX) 40 MG tablet Take 1 tablet (40 mg total) by mouth daily. 03/17/21   Seabron Spates R, DO  glucose blood (ACCU-CHEK GUIDE) test strip USE TO TEST BLOOD SUGAR UP TO 4 TIMES DAILY AS INSTRUCTED 08/03/20   Zola Button, Grayling Congress, DO  Nebivolol HCl 20 MG TABS Take 1 tablet (20 mg total) by mouth daily. 06/06/22    Seabron Spates R, DO  NONFORMULARY OR COMPOUNDED ITEM Compression socks  20-30 mm/hg #1  as directed 04/24/22   Zola Button, Grayling Congress, DO  POTASSIUM GLUCONATE PO Take by mouth. Per patient taking 650 mg tablet daily    [provider]  traZODone (DESYREL) 50 MG tablet Take 0.5-1 tablets (25-50 mg total) by mouth at bedtime as needed for sleep. 05/01/22   Seabron Spates R, DO  valsartan (DIOVAN) 320 MG tablet Take 1 tablet (320 mg total) by mouth daily. 05/15/22   Donato Schultz, DO  Vitamin D, Ergocalciferol, (DRISDOL) 1.25 MG (50000 UNIT) CAPS capsule Take 1 capsule (50,000 Units total) by mouth every 7 (seven) days. 07/21/21   Seabron Spates R, DO  warfarin (COUMADIN) 5 MG tablet TAKE 1 TABLET BY MOUTH DAILY EXCEPT TAKE 1/2 TABLET ON TUESDAYS, THURSDAY, SATURDAYS OR AS DIRECTED BY ANTICOAGULATION CLINIC 09/21/22   Zola Button, Grayling Congress, DO      Allergies    Amlodipine, Carvedilol, Chlorthalidone, Hydralazine, Statins, Zetia [ezetimibe], Cefdinir, Lyrica [pregabalin], Cefuroxime axetil, and Sulfonamide derivatives    Review of Systems   Review of Systems  Gastrointestinal:  Positive for hematochezia.    Physical Exam Updated Vital Signs BP (!) 176/69   Pulse (!) 56   Temp 97.6 F (36.4 C) (Oral)   Resp 13   Ht 5\' 3"  (1.6 m)   Wt 73.9 kg   SpO2 96%   BMI 28.87 kg/m  Physical Exam Vitals and nursing note reviewed.  HENT:     Head: Normocephalic.  Cardiovascular:     Rate and Rhythm: Regular rhythm.  Pulmonary:     Breath sounds: No wheezing.  Abdominal:     Tenderness: There is no abdominal tenderness.  Skin:    General: Skin is warm.  Neurological:     Mental Status: She is alert and oriented to person, place, and time.     ED Results / Procedures / Treatments   Labs (all labs ordered are listed, but only abnormal results are displayed) Labs Reviewed  BASIC METABOLIC PANEL - Abnormal; Notable for the following components:      Result Value    Glucose, Bld 111 (*)    Calcium 8.7 (*)    All other components within normal limits  PROTIME-INR - Abnormal; Notable for the following components:   Prothrombin Time 23.4 (*)    INR 2.1 (*)    All other components within normal limits  CBC    EKG EKG Interpretation Date/Time:  Friday November 03 2022 10:10:55 EDT Ventricular Rate:  59 PR Interval:  248 QRS Duration:  88 QT Interval:  431 QTC Calculation: 427 R Axis:   -59  Text Interpretation: Sinus rhythm Prolonged PR interval Left anterior fascicular block Borderline repolarization abnormality Confirmed by Benjiman Core [phone]) on 11/03/2022 12:33:58 PM  Radiology No results found.  Procedures Procedures    Medications Ordered in ED Medications  iohexol (OMNIPAQUE) 350 MG/ML injection 100 mL (100 mLs Intravenous Contrast Given 11/03/22 1403)    ED Course/ Medical Decision Making/ A&P                                 Medical Decision Making Amount and/or Complexity of Data Reviewed Labs: ordered. Radiology: ordered.  Risk Prescription drug management.   Patient with GI bleeding.  Likely lower.  Differential diagnosis includes diverticulitis, other lower GI bleed.  Is on blood thinners however.  INR level has been reassuring.  Will get basic blood work.  Blood work so far reassuring.  INR therapeutic and hemoglobin normal.  Not orthostatic.  Discussed with [facility].  With comorbidities and being on anticoagulation recommend admission to hospital for further monitoring.  However requests getting CT angiography first.  Vital signs have remained reassuring.  Care turned over to Dr. Rush Landmark.         Final Clinical Impression(s) / ED Diagnoses Final diagnoses:  Lower GI bleed    Rx / DC Orders ED Discharge Orders     None         Benjiman Core, MD 11/03/22 [id number]

## 2022-11-03 NOTE — ED Notes (Signed)
[facility] called for transport. 

## 2022-11-03 NOTE — Progress Notes (Addendum)
Hospitalist Transfer Note:    Nursing staff, Please call [facility] Admits & Consults System-Wide number on Amion [phone]) as soon as patient's arrival, so appropriate admitting provider can evaluate the pt.   Transferring facility: [facility] Requesting provider: Dr. Rush Landmark (EDP at [facility]) Reason for transfer: admission for further evaluation and management of acute lower GI bleed.   89 year old female with history of factor V Leiden for which she is chronically anticoagulated on warfarin,  who presented to [facility] ED complaining of 1-2 episodes of hematochezia over the last day, without any additional episodes of hematochezia in the ED.  Not associated with any melena.  Not associated with any abdominal discomfort.  Vital signs in the ED were notable for the following: Afebrile, heart rates in the 50s to 80s; systolic blood pressures in the 170s mmHg.   Labs were notable for hemoglobin 13.5 compared to baseline in the range of 14-16, with most recent prior hemoglobin 16 on 08/02/2022. INR 2.1.  EDP d/w [facility], who initially requested CTA GI bleed study, which showed no evidence of active extravasation, will also showing diverticulosis in the absence of diverticulitis.  EDP further discussed patient's case with on-call [facility], who will formally consult and see the patient at [facility].    Subsequently, I accepted this patient for transfer for observation to a med-tele bed at [facility] for further work-up and management of the above.       Newton Pigg, DO Hospitalist

## 2022-11-03 NOTE — ED Provider Notes (Signed)
3:12 PM Care assumed from Dr. Rubin Payor.  At time of transfer care, patient awaiting results of CT GI bleed of the abdomen pelvis followed by admission to [facility] as recommended by [facility] whom the previous team spoke with.  Anticipate reassessment after the CT is completed.  4:47 PM CT GI bleed study does not show evidence of large GI hemorrhage at this time.  Shows diverticulosis but no diverticulitis which is the likely cause.  Shows gallstones and sludge but no acute cholecystitis and shows a right kidney stone without other obstruction.  Patient was made aware of all these findings.  Per plan, will admit for hemoglobin monitoring and reassessment as recommended by GI and previous team.  Will call medicine for admission to [facility].   Bemnet Trovato, Canary Brim, MD 11/03/22 2320

## 2022-11-03 NOTE — Assessment & Plan Note (Signed)
-  holding warfarin due to rectal bleeding

## 2022-11-03 NOTE — Telephone Encounter (Signed)
Pt triaged to ED

## 2022-11-03 NOTE — Assessment & Plan Note (Signed)
-  BP is elevated.  Continue home amlodipine, valsartan, nebivolol

## 2022-11-03 NOTE — ED Triage Notes (Signed)
Reports blood in stool this morning x 1, states she is on Coumadin for blood clots. Reports she does feel some weakness since the occurrence.

## 2022-11-03 NOTE — Telephone Encounter (Signed)
Pt's granddaughter called and states there was a "good amount" of blood in pt's toilet this morning. She did not want to go more into detail and requested to speak with a nurse. Transferred to triage.

## 2022-11-03 NOTE — Assessment & Plan Note (Signed)
-  A1C of 6.7 in March

## 2022-11-03 NOTE — Assessment & Plan Note (Addendum)
-  Presented with 1 episode of rectal bleeding.  Has not had any more episodes since being in the hospital. Hgb of 13.5 with baseline around 14.  CT angio of the abdomen was negative for any acute extravasation.  There is findings of sigmoid diverticulosis and her bleeding likely is a diverticular bleed and may have already stopped. - Had remote colonoscopy in 2009 without significant findings -[facility] was consulted and will see in consultation -She is on warfarin for factor V Leiden deficiency and INR is currently therapeutic at 2.1.  Last dose was yesterday on 10/3.  Will hold dose today and follow INR and Hgb trend tomorrow. -Obtain Type and screen

## 2022-11-03 NOTE — Assessment & Plan Note (Signed)
Patient has statin intolerance. 

## 2022-11-03 NOTE — Telephone Encounter (Signed)
Noted. FYI 

## 2022-11-03 NOTE — ED Notes (Signed)
ED TO INPATIENT HANDOFF REPORT  ED Nurse Name and Phone #: Milagros Loll [phone]  S Name/Age/Gender [name] Little 89 y.o. female Room/Bed: MH05/MH05  Code Status   Code Status: Prior  Home/SNF/Other Home Patient oriented to: situation Is this baseline? Yes   Triage Complete: Triage complete  Chief Complaint Acute lower GI bleeding [K92.2]  Triage Note Reports blood in stool this morning x 1, states she is on Coumadin for blood clots. Reports she does feel some weakness since the occurrence.    Allergies Allergies  Allergen Reactions   Amlodipine Swelling    "my whole body swelled"   Carvedilol Swelling    "my whole body swelled"   Chlorthalidone     Patient reported extreme weakness.   Hydralazine Other (See Comments)    Joint and chest pain   Statins Other (See Comments)    Weakness, pain Lipitor and Pravachol Weakness, pain Lipitor and Pravachol   Zetia [Ezetimibe]     Unable to walk, severe leg pain   Cefdinir     On blood thinnner   Lyrica [Pregabalin] Other (See Comments)    Weakness and joint pain   Cefuroxime Axetil     REACTION: Ddoes not remember reaction   Sulfonamide Derivatives Rash    REACTION: Rash    Level of Care/Admitting Diagnosis ED Disposition     ED Disposition  Admit   Condition  --   Comment  Hospital Area: [facility] [[id number]]  Level of Care: Telemetry [5]  Admit to tele based on following criteria: Monitor for Ischemic changes  Interfacility transfer: Yes  May place patient in observation at [facility] or [facility] if equivalent level of care is available:: No  Covid Evaluation: Asymptomatic - no recent exposure (last 10 days) testing not required  Diagnosis: Acute lower GI bleeding [454098]  Admitting Physician: Angie Fava [[id number]]  Attending Physician: Angie Fava [[id number]]          B Medical/Surgery History Past Medical History:  Diagnosis Date   Anticoagulated on Coumadin     long-term for factor V---  managed by coumadin clinic and pcp   Diet-controlled type 2 diabetes mellitus (HCC)    followed by pcp   Factor V deficiency (HCC) 1998   anticoagulated on coumadin   GAD (generalized anxiety disorder)    Heart murmur    Hemorrhoids    History of avascular necrosis of capital femoral epiphysis 1999   s/p THA right   History of basal cell carcinoma (BCC) excision    scalp/ neck   History of Clostridium difficile infection    History of DVT of lower extremity 1998;  04/ 2006   LLE   History of external beam radiation therapy 2010   right breast cancer   History of ITP    per pt hx long-term use prednisone for ITP,  1998 s/p total splenctomy   History of kidney stones    History of melanoma 1980s   s/p  right calf melanoma excision , per pt not malignant and localized ,  no recurrence   History of paroxysmal supraventricular tachycardia    (02-23-2020  pt stated have not had any issues / symptoms in several years   History of pulmonary embolus (PE) 04/2004   History of right breast cancer 2010   08-10-2008 s/p  right lumpectomy w/ sln bx (partial mastectomy) , ER+, Stage I,  completed radiation 2010,  per pt no recurrance   Hypertension  Macular degeneration, bilateral    mild   Mitral valve regurgitation 09/01/2016   cardiology-- dr Eden Emms--- per note mild to moderate without stenosis,     Mixed hyperlipidemia 05/18/2015   Nocturia    OA (osteoarthritis)    lumbar stenosis, radiculopathy, OA- L hip   Personal history of colonic polyps    hyperplastic   Renal calculus, right    Right ureteral calculus    Thoracic ascending aortic aneurysm (HCC)    last CT angio chest in epic 11-29-2016  , 3.7cm   Past Surgical History:  Procedure Laterality Date   ABDOMINAL HYSTERECTOMY  1973   BREAST LUMPECTOMY WITH RADIOACTIVE SEED AND SENTINEL LYMPH NODE BIOPSY Right 08/10/2008   @[facility]    CATARACT EXTRACTION W/ INTRAOCULAR LENS  IMPLANT, BILATERAL Bilateral  2001   CYSTOSCOPY WITH RETROGRADE PYELOGRAM, URETEROSCOPY AND STENT PLACEMENT Right 02/25/2020   Procedure: CYSTOSCOPY WITH RETROGRADE PYELOGRAM, URETEROSCOPY AND STENT EXCHANGE;  Surgeon: Sebastian Ache, MD;  Location: [facility];  Service: Urology;  Laterality: Right;  75 MINS   CYSTOSCOPY WITH STENT PLACEMENT Right 01/27/2020   Procedure: CYSTOSCOPY WITH STENT PLACEMENT;  Surgeon: Sebastian Ache, MD;  Location: [facility];  Service: Urology;  Laterality: Right;   CYSTOSCOPY/URETEROSCOPY/HOLMIUM LASER/STENT PLACEMENT Left 06-27-2018  dr Logan Bores,  @[facility]    HOLMIUM LASER APPLICATION Right 02/25/2020   Procedure: HOLMIUM LASER APPLICATION;  Surgeon: Sebastian Ache, MD;  Location: [facility];  Service: Urology;  Laterality: Right;   I & D EXTREMITY Right 10/26/2017   Procedure: IRRIGATION AND DEBRIDEMENT RIGHT HAND;  Surgeon: Bradly Bienenstock, MD;  Location: [facility];  Service: Orthopedics;  Laterality: Right;   LUMBAR LAMINECTOMY/DECOMPRESSION MICRODISCECTOMY  04/11/2011   Procedure: LUMBAR LAMINECTOMY/DECOMPRESSION MICRODISCECTOMY;  Surgeon: Barnett Abu, MD;  Location: [facility];  Service: Neurosurgery;  Laterality: N/A;  Lumbar Five-Sacral One Microdiscectomy   MELANOMA EXCISION Right 1980s   right calf   SPLENECTOMY, TOTAL  1998   TOTAL HIP ARTHROPLASTY Right 1999   UPPER GASTROINTESTINAL ENDOSCOPY       A IV Location/Drains/Wounds Patient Lines/Drains/Airways Status     Active Line/Drains/Airways     Name Placement date Placement time Site Days   Peripheral IV 11/03/22 20 G Anterior;Distal;Right Wrist 11/03/22  1002  Wrist  less than 1   Peripheral IV 11/03/22 20 G 1.16" Anterior;Proximal;Right Forearm 11/03/22  1349  Forearm  less than 1   Ureteral Drain/Stent Right ureter 5 Fr. 02/25/20  1433  Right ureter  982            Intake/Output Last 24 hours No intake or output data in the 24 hours ending 11/03/22 1941  Labs/Imaging Results for orders  placed or performed during the hospital encounter of 11/03/22 (from the past 48 hour(s))  Basic metabolic panel     Status: Abnormal   Collection Time: 11/03/22 10:03 AM  Result Value Ref Range   Sodium 141 135 - 145 mmol/L   Potassium 3.8 3.5 - 5.1 mmol/L   Chloride 105 98 - 111 mmol/L   CO2 26 22 - 32 mmol/L   Glucose, Bld 111 (H) 70 - 99 mg/dL    Comment: Glucose reference range applies only to samples taken after fasting for at least 8 hours.   BUN 21 8 - 23 mg/dL   Creatinine, Ser 4.09 0.44 - 1.00 mg/dL   Calcium 8.7 (L) 8.9 - 10.3 mg/dL   GFR, Estimated >81 >19 mL/min    Comment: (NOTE) Calculated using the CKD-EPI Creatinine  Equation (2021)    Anion gap 10 5 - 15    Comment: Performed at [facility], [address] [place], [place] [address]  Protime-INR     Status: Abnormal   Collection Time: 11/03/22 10:03 AM  Result Value Ref Range   Prothrombin Time 23.4 (H) 11.4 - 15.2 seconds   INR 2.1 (H) 0.8 - 1.2    Comment: (NOTE) INR goal varies based on device and disease states. Performed at [facility], [address] [place], [place] [address]   CBC     Status: None   Collection Time: 11/03/22 10:03 AM  Result Value Ref Range   WBC 7.2 4.0 - 10.5 K/uL   RBC 4.55 3.87 - 5.11 MIL/uL   Hemoglobin 13.5 12.0 - 15.0 g/dL   HCT 65.7 84.6 - 96.2 %   MCV 91.2 80.0 - 100.0 fL   MCH 29.7 26.0 - 34.0 pg   MCHC 32.5 30.0 - 36.0 g/dL   RDW 95.2 84.1 - 32.4 %   Platelets 308 150 - 400 K/uL   nRBC 0.0 0.0 - 0.2 %    Comment: Performed at [facility], [address] [place], [place] [address]   CT ANGIO GI BLEED  Result Date: 11/03/2022 CLINICAL DATA:  GI bleeding, rectal bleeding this morning EXAM: CTA ABDOMEN AND PELVIS WITHOUT AND WITH CONTRAST TECHNIQUE: Multidetector CT imaging of the abdomen and pelvis was performed using the standard protocol during bolus administration of intravenous contrast. Multiplanar reconstructed images and MIPs  were obtained and reviewed to evaluate the vascular anatomy. RADIATION DOSE REDUCTION: This exam was performed according to the departmental dose-optimization program which includes automated exposure control, adjustment of the mA and/or kV according to patient size and/or use of iterative reconstruction technique. CONTRAST:  OMNIPAQUE IOHEXOL 350 MG/ML SOLN COMPARISON:  01/27/2020 FINDINGS: VASCULAR Normal contour and caliber of the abdominal aorta. No evidence of aneurysm, dissection, or other acute aortic pathology. 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 Severe mixed calcific atherosclerosis. Review of the MIP images confirms the above findings. NON-VASCULAR Lower Chest: Cardiomegaly. Hepatobiliary: No solid liver abnormality is seen. Gallstones and sludge in the gallbladder. No gallbladder wall thickening, or biliary dilatation. Pancreas: Unremarkable. No pancreatic ductal dilatation or surrounding inflammatory changes. Spleen: Status post splenectomy. Hypertrophic splenule in the left upper quadrant. Adrenals/Urinary Tract: Adrenal glands are unremarkable. Small nonobstructive right renal calculi. No ureteral calculi or hydronephrosis. Bladder is unremarkable. Stomach/Bowel: Stomach is within normal limits. Appendix appears normal. No evidence of bowel wall thickening, distention, or inflammatory changes. Sigmoid diverticulosis. No intraluminal contrast extravasation or other findings to localize reported GI bleeding. Lymphatic: No enlarged abdominal or pelvic lymph nodes. Reproductive: Status post hysterectomy. Other: No abdominal wall hernia or abnormality. No ascites. Musculoskeletal: No acute osseous findings. Status post right hip total arthroplasty. IMPRESSION: 1. No intraluminal contrast extravasation or other findings to specifically localize reported GI bleeding. 2. Sigmoid diverticulosis without evidence of acute diverticulitis.  3. Gallstones and sludge in the gallbladder. No CT evidence of acute cholecystitis. 4. Small nonobstructive right renal calculi. 5. Cardiomegaly. Aortic Atherosclerosis (ICD10-I70.0). Electronically Signed   By: Jearld Lesch M.D.   On: 11/03/2022 15:32    Pending Labs Unresulted Labs (From admission, onward)    None       Vitals/Pain Today's Vitals   11/03/22 1115 11/03/22 1330 11/03/22 1345 11/03/22 1742  BP: (!) 172/63 (!) 176/69 Marland Kitchen)  173/82 (!) 178/67  Pulse: (!) 56 (!) 56 80 (!) 55  Resp: 14 13 19 15   Temp: 97.6 F (36.4 C) 97.6 F (36.4 C) 97.6 F (36.4 C) 97.6 F (36.4 C)  TempSrc:  Oral    SpO2: 95% 96% 96% 96%  Weight:      Height:      PainSc:        Isolation Precautions No active isolations  Medications Medications  iohexol (OMNIPAQUE) 350 MG/ML injection 100 mL (100 mLs Intravenous Contrast Given 11/03/22 1403)    Mobility walks     Focused Assessments Cardiac Assessment Handoff:    Lab Results  Component Value Date   TROPONINI <0.03 08/15/2016   No results found for: "DDIMER" Does the Patient currently have chest pain? No    R Recommendations: See Admitting Provider Note  Report given to:   Additional Notes: N/A

## 2022-11-04 ENCOUNTER — Encounter (HOSPITAL_COMMUNITY): Payer: Self-pay | Admitting: Internal Medicine

## 2022-11-04 DIAGNOSIS — K625 Hemorrhage of anus and rectum: Secondary | ICD-10-CM

## 2022-11-04 DIAGNOSIS — K5731 Diverticulosis of large intestine without perforation or abscess with bleeding: Secondary | ICD-10-CM

## 2022-11-04 DIAGNOSIS — K922 Gastrointestinal hemorrhage, unspecified: Principal | ICD-10-CM

## 2022-11-04 LAB — CBC
HCT: 44.4 % (ref 36.0–46.0)
Hemoglobin: 14.1 g/dL (ref 12.0–15.0)
MCH: 29.9 pg (ref 26.0–34.0)
MCHC: 31.8 g/dL (ref 30.0–36.0)
MCV: 94.3 fL (ref 80.0–100.0)
Platelets: 306 10*3/uL (ref 150–400)
RBC: 4.71 MIL/uL (ref 3.87–5.11)
RDW: 14.9 % (ref 11.5–15.5)
WBC: 8.8 10*3/uL (ref 4.0–10.5)
nRBC: 0 % (ref 0.0–0.2)

## 2022-11-04 LAB — PROTIME-INR
INR: 1.8 — ABNORMAL HIGH (ref 0.8–1.2)
Prothrombin Time: 20.6 s — ABNORMAL HIGH (ref 11.4–15.2)

## 2022-11-04 LAB — TYPE AND SCREEN

## 2022-11-04 NOTE — Progress Notes (Signed)
Progress Note   Patient: [name] Little UVO:[id number] DOB: 02/08/1933 DOA: 11/03/2022     0 DOS: the patient was seen and examined on 11/04/2022   Brief hospital course: 89 y.o. female with medical history significant of factor V Leiden deficiency on warfarin, history of PE and recurrent DVT, hypertension, hyperlipidemia, type 2 diabetes, osteoarthritis, s/p splenectomy secondary to ITP who presents with rectal bleeding.   Patient reports that earlier this morning around 8 AM she was urinating and then noticed the toilet was full of blood.  Felt left lower abdominal pain when she palpated it.  No hemorrhoids.  She denies any history of GI bleed.  She is on warfarin for factor V Leiden deficiency and had last dose yesterday on 10/3.  Denies any nausea, vomiting, diarrhea constipation.  Remote colonoscopy in 2009 without significant findings.  Assessment and Plan: Acute lower GI bleeding -Presented with 1 episode of rectal bleeding.  Has not had any more episodes since being in the hospital. Hgb of 13.5 with baseline around 14.  CT angio of the abdomen was negative for any acute extravasation.   - Had remote colonoscopy in 2009 without significant findings -coumadin was held -Hgb had remained overall stable -GI consulted. Per GI, can resume diet, and can d/c in AM if no further bleed with resumption of anticoag in 48h   Factor V deficiency (HCC) -holding warfarin due to rectal bleeding -GI recs to resume anticoag in 48h if no further bleed   Type 2 diabetes mellitus with obesity (HCC) -A1C of 6.7 in March   Essential hypertension -BP is elevated.  Continue home amlodipine, valsartan, nebivolol   Hyperlipidemia - Patient has statin intolerance   Subjective: Without complaints. Very eager to go home soon  Physical Exam: Vitals:   11/03/22 2346 11/04/22 0442 11/04/22 0807 11/04/22 1321  BP: (!) 179/78 (!) 166/69 (!) 153/68 (!) 162/82  Pulse: (!) 52 (!) 59 (!) 54 80  Resp: 14      Temp:  97.7 F (36.5 C) 97.9 F (36.6 C) 98.2 F (36.8 C)  TempSrc:  Oral Oral   SpO2:  93% 93% 94%  Weight:      Height:       General exam: Awake, laying in bed, in nad Respiratory system: Normal respiratory effort, no wheezing Cardiovascular system: regular rate, s1, s2 Gastrointestinal system: Soft, nondistended, positive BS Central nervous system: CN2-12 grossly intact, strength intact Extremities: Perfused, no clubbing Skin: Normal skin turgor, no notable skin lesions seen Psychiatry: Mood normal // no visual hallucinations   Data Reviewed:  Labs reviewed: WBC 8.8, Hgb 14.1, Plts 306, INR 1.8  Family Communication: Pt in room, family not at bedside  Disposition: Status is: Observation The patient remains OBS appropriate and will d/c before 2 midnights.  Planned Discharge Destination:  Home     Author: Rickey Barbara, MD 11/04/2022 4:43 PM  For on call review [email or web address].

## 2022-11-04 NOTE — Plan of Care (Signed)
  Problem: Clinical Measurements: Goal: Ability to maintain clinical measurements within normal limits will improve Outcome: Progressing Goal: Will remain free from infection Outcome: Progressing   Problem: Nutrition: Goal: Adequate nutrition will be maintained Outcome: Progressing   Problem: Elimination: Goal: Will not experience complications related to bowel motility Outcome: Progressing Goal: Will not experience complications related to urinary retention Outcome: Progressing

## 2022-11-04 NOTE — Consult Note (Signed)
[facility] CONSULT NOTE Requesting: [facility] hospitalist Primary GI: Dr. Linward Foster (requested) Reason for consultation: Rectal bleeding  HISTORY OF PRESENT ILLNESS:  [name] Little is a 89 y.o. female with multiple medical problems including chronic anticoagulation for history of 5 deficiency and DVT.  She presents with painless hematochezia.  Her son is in the room during today's interview.  She reports 1 bloody bowel movement.  She reports 1 bowel movement today which was more normal-appearing.  She denies hemorrhoids.  No complaints.  Wants to go home to attend church homecoming tomorrow.  LABORATORIES: Hemoglobin 14.1 (13.5 yesterday) INR 1.8 (2.1 yesterday)  CTA: Pandiverticulosis.  No active bleeding.  REVIEW OF SYSTEMS:  All non-GI ROS negative except for  Past Medical History:  Diagnosis Date   Anticoagulated on Coumadin    long-term for factor V---  managed by coumadin clinic and pcp   Diet-controlled type 2 diabetes mellitus (HCC)    followed by pcp   Factor V deficiency (HCC) 1998   anticoagulated on coumadin   GAD (generalized anxiety disorder)    Heart murmur    Hemorrhoids    History of avascular necrosis of capital femoral epiphysis 1999   s/p THA right   History of basal cell carcinoma (BCC) excision    scalp/ neck   History of Clostridium difficile infection    History of DVT of lower extremity 1998;  04/ 2006   LLE   History of external beam radiation therapy 2010   right breast cancer   History of ITP    per pt hx long-term use prednisone for ITP,  1998 s/p total splenctomy   History of kidney stones    History of melanoma 1980s   s/p  right calf melanoma excision , per pt not malignant and localized ,  no recurrence   History of paroxysmal supraventricular tachycardia    (02-23-2020  pt stated have not had any issues / symptoms in several years   History of pulmonary embolus (PE) 04/2004   History of right breast cancer 2010   08-10-2008  s/p  right lumpectomy w/ sln bx (partial mastectomy) , ER+, Stage I,  completed radiation 2010,  per pt no recurrance   Hypertension    Macular degeneration, bilateral    mild   Mitral valve regurgitation 09/01/2016   cardiology-- dr Eden Emms--- per note mild to moderate without stenosis,     Mixed hyperlipidemia 05/18/2015   Nocturia    OA (osteoarthritis)    lumbar stenosis, radiculopathy, OA- L hip   Personal history of colonic polyps    hyperplastic   Renal calculus, right    Right ureteral calculus    Thoracic ascending aortic aneurysm (HCC)    last CT angio chest in epic 11-29-2016  , 3.7cm    Past Surgical History:  Procedure Laterality Date   ABDOMINAL HYSTERECTOMY  1973   BREAST LUMPECTOMY WITH RADIOACTIVE SEED AND SENTINEL LYMPH NODE BIOPSY Right 08/10/2008   @[facility]    CATARACT EXTRACTION W/ INTRAOCULAR LENS  IMPLANT, BILATERAL Bilateral 2001   CYSTOSCOPY WITH RETROGRADE PYELOGRAM, URETEROSCOPY AND STENT PLACEMENT Right 02/25/2020   Procedure: CYSTOSCOPY WITH RETROGRADE PYELOGRAM, URETEROSCOPY AND STENT EXCHANGE;  Surgeon: Sebastian Ache, MD;  Location: [facility];  Service: Urology;  Laterality: Right;  75 MINS   CYSTOSCOPY WITH STENT PLACEMENT Right 01/27/2020   Procedure: CYSTOSCOPY WITH STENT PLACEMENT;  Surgeon: Sebastian Ache, MD;  Location: [facility];  Service: Urology;  Laterality: Right;   CYSTOSCOPY/URETEROSCOPY/HOLMIUM LASER/STENT PLACEMENT Left  06-27-2018  dr Logan Bores,  @[facility]    HOLMIUM LASER APPLICATION Right 02/25/2020   Procedure: HOLMIUM LASER APPLICATION;  Surgeon: Sebastian Ache, MD;  Location: [facility];  Service: Urology;  Laterality: Right;   I & D EXTREMITY Right 10/26/2017   Procedure: IRRIGATION AND DEBRIDEMENT RIGHT HAND;  Surgeon: Bradly Bienenstock, MD;  Location: [facility];  Service: Orthopedics;  Laterality: Right;   LUMBAR LAMINECTOMY/DECOMPRESSION MICRODISCECTOMY  04/11/2011   Procedure: LUMBAR LAMINECTOMY/DECOMPRESSION  MICRODISCECTOMY;  Surgeon: Barnett Abu, MD;  Location: [facility];  Service: Neurosurgery;  Laterality: N/A;  Lumbar Five-Sacral One Microdiscectomy   MELANOMA EXCISION Right 1980s   right calf   SPLENECTOMY, TOTAL  1998   TOTAL HIP ARTHROPLASTY Right 1999   UPPER GASTROINTESTINAL ENDOSCOPY      Social History [name] Little  reports that she has never smoked. She has never used smokeless tobacco. She reports that she does not drink alcohol and does not use drugs.  family history includes Alzheimer's disease in her sister; Breast cancer in her paternal aunt; Cancer in her paternal aunt; Clotting disorder in her maternal uncle; Dementia in her sister, sister, and sister; Heart disease in her father, mother, and another family member; Stroke in her sister.  Allergies  Allergen Reactions   Amlodipine Swelling    "my whole body swelled"   Carvedilol Swelling    "my whole body swelled"   Chlorthalidone     Patient reported extreme weakness.   Hydralazine Other (See Comments)    Joint and chest pain   Statins Other (See Comments)    Weakness, pain Lipitor and Pravachol Weakness, pain Lipitor and Pravachol   Zetia [Ezetimibe]     Unable to walk, severe leg pain   Cefdinir     On blood thinnner   Lyrica [Pregabalin] Other (See Comments)    Weakness and joint pain   Cefuroxime Axetil     REACTION: Ddoes not remember reaction   Sulfonamide Derivatives Rash    REACTION: Rash       PHYSICAL EXAMINATION: Vital signs: BP (!) 162/82 (BP Location: Left Arm)   Pulse 80   Temp 98.2 F (36.8 C)   Resp 14   Ht 5\' 3"  (1.6 m)   Wt 73.9 kg   SpO2 94%   BMI 28.87 kg/m   Constitutional: generally well-appearing, no acute distress Psychiatric: alert and oriented x3, cooperative Eyes: extraocular movements intact, anicteric, conjunctiva pink Mouth: oral pharynx moist, no lesions Neck: supple no lymphadenopathy Cardiovascular: heart regular rate and rhythm, no murmur Lungs:  clear to auscultation bilaterally Abdomen: soft, nontender, nondistended, no obvious ascites, no peritoneal signs, normal bowel sounds, no organomegaly Rectal: Omitted Extremities: no clubbing, cyanosis, or lower extremity edema bilaterally Skin: no lesions on visible extremities Neuro: No focal deficits.  Cranial nerves intact  ASSESSMENT:  1.  1 episode of rectal bleeding with normal bowel movement thereafter.  Question very minor diverticular bleed versus pathology.  [place] 12 with diverticulosis.  CTA is noted.  Normal hemoglobin without change from baseline 2.  Chronic anticoagulation for history of factor V deficiency and DVT 3.  Multiple general medical problems   PLAN:  1.  Diet of choice 2.  If no further bleeding, okay for DC in a.m. 3.  If no further bleeding, she can resume her anticoagulation in 48 hours  Discussed with patient and son.  No additional recommendations or plans.  GI will sign off.  Wilhemina Bonito. Eda Keys., M.D. [facility] Division of  Gastroenterology

## 2022-11-04 NOTE — Progress Notes (Signed)
Patient has small BM this afternoon with small bright blood on it.

## 2022-11-04 NOTE — Hospital Course (Signed)
89 y.o. female with medical history significant of factor V Leiden deficiency on warfarin, history of PE and recurrent DVT, hypertension, hyperlipidemia, type 2 diabetes, osteoarthritis, s/p splenectomy secondary to ITP who presents with rectal bleeding.   Patient reports that earlier this morning around 8 AM she was urinating and then noticed the toilet was full of blood.  Felt left lower abdominal pain when she palpated it.  No hemorrhoids.  She denies any history of GI bleed.  She is on warfarin for factor V Leiden deficiency and had last dose yesterday on 10/3.  Denies any nausea, vomiting, diarrhea constipation.  Remote colonoscopy in 2009 without significant findings.

## 2022-11-04 NOTE — Progress Notes (Signed)
Mobility Specialist - Progress Note   11/04/22 1346  Mobility  Activity Ambulated independently in hallway  Level of Assistance Independent  Assistive Device None  Distance Ambulated (ft) 480 ft  Activity Response Tolerated well  Mobility Referral Yes  $Mobility charge 1 Mobility  Mobility Specialist Start Time (ACUTE ONLY) 0138  Mobility Specialist Stop Time (ACUTE ONLY) 0145  Mobility Specialist Time Calculation (min) (ACUTE ONLY) 7 min   Pt received in bed and agreeable to mobility. No complaints during session. Pt to bed after session with all needs met.    [facility]

## 2022-11-05 DIAGNOSIS — K922 Gastrointestinal hemorrhage, unspecified: Secondary | ICD-10-CM | POA: Diagnosis not present

## 2022-11-05 LAB — CBC
HCT: 44.9 % (ref 36.0–46.0)
Hemoglobin: 14.3 g/dL (ref 12.0–15.0)
MCH: 30.4 pg (ref 26.0–34.0)
MCHC: 31.8 g/dL (ref 30.0–36.0)
MCV: 95.3 fL (ref 80.0–100.0)
Platelets: 308 10*3/uL (ref 150–400)
RBC: 4.71 MIL/uL (ref 3.87–5.11)
RDW: 15 % (ref 11.5–15.5)
WBC: 10.1 10*3/uL (ref 4.0–10.5)
nRBC: 0 % (ref 0.0–0.2)

## 2022-11-05 LAB — COMPREHENSIVE METABOLIC PANEL
ALT: 13 U/L (ref 0–44)
AST: 17 U/L (ref 15–41)
Albumin: 3.5 g/dL (ref 3.5–5.0)
Alkaline Phosphatase: 57 U/L (ref 38–126)
Anion gap: 10 (ref 5–15)
BUN: 21 mg/dL (ref 8–23)
CO2: 24 mmol/L (ref 22–32)
Calcium: 8.9 mg/dL (ref 8.9–10.3)
Chloride: 105 mmol/L (ref 98–111)
Creatinine, Ser: 0.75 mg/dL (ref 0.44–1.00)
GFR, Estimated: >60 mL/min (ref >60)
Glucose, Bld: 105 mg/dL — ABNORMAL HIGH (ref 70–99)
Potassium: 4 mmol/L (ref 3.5–5.1)
Sodium: 139 mmol/L (ref 135–145)
Total Bilirubin: 1.1 mg/dL (ref 0.3–1.2)
Total Protein: 7 g/dL (ref 6.5–8.1)

## 2022-11-05 NOTE — Discharge Instructions (Signed)
Please resume your coumadin at the usual dose on 10/8 (Tuesday)

## 2022-11-05 NOTE — Discharge Summary (Signed)
Physician Discharge Summary   Patient: [name] Little MRN: [id number] DOB: 1934-01-04  Admit date:     11/03/2022  Discharge date: 11/05/22  Discharge Physician: Rickey Barbara   PCP: Donato Schultz, DO   Recommendations at discharge:    Follow up with PCP In 1-2 weeks Follow up with GI as needed Pt advised to follow up for coumadin level check on 10/10 or 10/11  Discharge Diagnoses: Principal Problem:   Acute lower GI bleeding Active Problems:   Hyperlipidemia   Essential hypertension   Type 2 diabetes mellitus with obesity (HCC)   Factor V deficiency (HCC)  Resolved Problems:   * No resolved hospital problems. *  Hospital Course: 89 y.o. female with medical history significant of factor V Leiden deficiency on warfarin, history of PE and recurrent DVT, hypertension, hyperlipidemia, type 2 diabetes, osteoarthritis, s/p splenectomy secondary to ITP who presents with rectal bleeding.   Patient reports that earlier this morning around 8 AM she was urinating and then noticed the toilet was full of blood.  Felt left lower abdominal pain when she palpated it.  No hemorrhoids.  She denies any history of GI bleed.  She is on warfarin for factor V Leiden deficiency and had last dose yesterday on 10/3.  Denies any nausea, vomiting, diarrhea constipation.  Remote colonoscopy in 2009 without significant findings.  Assessment and Plan: Acute lower GI bleeding -Presented with 1 episode of rectal bleeding.  Has not had any more episodes since being in the hospital. Hgb of 13.5 with baseline around 14.  CT angio of the abdomen was negative for any acute extravasation.   - Had remote colonoscopy in 2009 without significant findings -coumadin was held -Hgb had remained overall stable -GI consulted. Per GI, can resume diet. No further bleeding noted overnight. Per GI recommendation to resume coumadin on 10/8   Factor V deficiency (HCC) -held warfarin due to rectal bleeding -GI recs  to resume anticoag on 10/8   Type 2 diabetes mellitus with obesity (HCC) -A1C of 6.7 in March   Essential hypertension -BP is elevated.  Continue home amlodipine, valsartan, nebivolol   Hyperlipidemia - Patient has statin intolerance    Consultants: GI Procedures performed:   Disposition: Home Diet recommendation:  Cardiac diet DISCHARGE MEDICATION: Allergies as of 11/05/2022       Reactions   Amlodipine Swelling   "my whole body swelled"   Carvedilol Swelling   "my whole body swelled"   Chlorthalidone    Patient reported extreme weakness.   Hydralazine Other (See Comments)   Joint and chest pain   Statins Other (See Comments)   Weakness, pain Lipitor and Pravachol Weakness, pain Lipitor and Pravachol   Zetia [ezetimibe]    Unable to walk, severe leg pain   Cefdinir    On blood thinnner   Lyrica [pregabalin] Other (See Comments)   Weakness and joint pain   Cefuroxime Axetil    REACTION: Ddoes not remember reaction   Sulfonamide Derivatives Rash   REACTION: Rash        Medication List     STOP taking these medications    furosemide 20 MG tablet Commonly known as: LASIX   furosemide 40 MG tablet Commonly known as: LASIX   POTASSIUM GLUCONATE PO   TOZAL PO   traZODone 50 MG tablet Commonly known as: DESYREL   Vitamin D (Ergocalciferol) 1.25 MG (50000 UNIT) Caps capsule Commonly known as: DRISDOL       TAKE these medications  Accu-Chek Guide test strip Generic drug: glucose blood USE TO TEST BLOOD SUGAR UP TO 4 TIMES DAILY AS INSTRUCTED   Accu-Chek Softclix Lancets lancets Use to check sugar daily or as needed.   amLODipine 5 MG tablet Commonly known as: NORVASC Take 1 tablet (5 mg total) by mouth daily.   blood glucose meter kit and supplies Kit Dispense based on patient and insurance preference. Use up to four times daily as directed. Dx: E11.9   Blood Pressure Kit Devi 1 Device by Does not apply route daily as needed. Fill per  patient insurance preference Dx: Hypertension   Cranberry 1000 MG Caps Take 1 capsule by mouth 2 (two) times daily.   Nebivolol HCl 20 MG Tabs Take 1 tablet (20 mg total) by mouth daily.   NONFORMULARY OR COMPOUNDED ITEM Compression socks  20-30 mm/hg #1  as directed   valsartan 320 MG tablet Commonly known as: DIOVAN Take 1 tablet (320 mg total) by mouth daily.   VITAMIN B COMPLEX PO Take 1 tablet by mouth daily.   warfarin 5 MG tablet Commonly known as: COUMADIN Take as directed. If you are unsure how to take this medication, talk to your nurse or doctor. Original instructions: TAKE 1 TABLET BY MOUTH DAILY EXCEPT TAKE 1/2 TABLET ON TUESDAYS, THURSDAY, SATURDAYS OR AS DIRECTED BY ANTICOAGULATION CLINIC What changed: See the new instructions.        Follow-up Information     Donato Schultz, DO Follow up in 2 week(s).   Specialty: Family Medicine Why: Hospital follow up Contact information: [address] [place] [address] [phone]         Follow up with your coumadin provider on Thursday or Friday this week Follow up.   Why: for coumadin level check        Hilarie Fredrickson, MD Follow up.   Specialty: Gastroenterology Why: As needed Contact information: [address] [place] [address] [phone]                Discharge Exam: Ceasar Mons Weights   11/03/22 0923  Weight: 73.9 kg   General exam: Awake, laying in bed, in nad Respiratory system: Normal respiratory effort, no wheezing Cardiovascular system: regular rate, s1, s2 Gastrointestinal system: Soft, nondistended, positive BS Central nervous system: CN2-12 grossly intact, strength intact Extremities: Perfused, no clubbing Skin: Normal skin turgor, no notable skin lesions seen Psychiatry: Mood normal // no visual hallucinations   Condition at discharge: fair  The results of significant diagnostics from this hospitalization (including imaging, microbiology,  ancillary and laboratory) are listed below for reference.   Imaging Studies: CT ANGIO GI BLEED  Result Date: 11/03/2022 CLINICAL DATA:  GI bleeding, rectal bleeding this morning EXAM: CTA ABDOMEN AND PELVIS WITHOUT AND WITH CONTRAST TECHNIQUE: Multidetector CT imaging of the abdomen and pelvis was performed using the standard protocol during bolus administration of intravenous contrast. Multiplanar reconstructed images and MIPs were obtained and reviewed to evaluate the vascular anatomy. RADIATION DOSE REDUCTION: This exam was performed according to the departmental dose-optimization program which includes automated exposure control, adjustment of the mA and/or kV according to patient size and/or use of iterative reconstruction technique. CONTRAST:  OMNIPAQUE IOHEXOL 350 MG/ML SOLN COMPARISON:  01/27/2020 FINDINGS: VASCULAR Normal contour and caliber of the abdominal aorta. No evidence of aneurysm, dissection, or other acute aortic pathology. 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 Severe mixed calcific atherosclerosis. Review  of the MIP images confirms the above findings. NON-VASCULAR Lower Chest: Cardiomegaly. Hepatobiliary: No solid liver abnormality is seen. Gallstones and sludge in the gallbladder. No gallbladder wall thickening, or biliary dilatation. Pancreas: Unremarkable. No pancreatic ductal dilatation or surrounding inflammatory changes. Spleen: Status post splenectomy. Hypertrophic splenule in the left upper quadrant. Adrenals/Urinary Tract: Adrenal glands are unremarkable. Small nonobstructive right renal calculi. No ureteral calculi or hydronephrosis. Bladder is unremarkable. Stomach/Bowel: Stomach is within normal limits. Appendix appears normal. No evidence of bowel wall thickening, distention, or inflammatory changes. Sigmoid diverticulosis. No intraluminal contrast extravasation or other findings to localize reported GI  bleeding. Lymphatic: No enlarged abdominal or pelvic lymph nodes. Reproductive: Status post hysterectomy. Other: No abdominal wall hernia or abnormality. No ascites. Musculoskeletal: No acute osseous findings. Status post right hip total arthroplasty. IMPRESSION: 1. No intraluminal contrast extravasation or other findings to specifically localize reported GI bleeding. 2. Sigmoid diverticulosis without evidence of acute diverticulitis. 3. Gallstones and sludge in the gallbladder. No CT evidence of acute cholecystitis. 4. Small nonobstructive right renal calculi. 5. Cardiomegaly. Aortic Atherosclerosis (ICD10-I70.0). Electronically Signed   By: Jearld Lesch M.D.   On: 11/03/2022 15:32    Microbiology: Results for orders placed or performed in visit on 02/09/22  Urine Culture     Status: Abnormal   Collection Time: 02/09/22 10:34 AM   Specimen: Urine  Result Value Ref Range Status   MICRO NUMBER: [id number]  Final   SPECIMEN QUALITY: Adequate  Final   Sample Source NOT GIVEN  Final   STATUS: FINAL  Final   ISOLATE 1: Klebsiella pneumoniae (A)  Final    Comment: Greater than 100,000 CFU/mL of Klebsiella pneumoniae      Susceptibility   Klebsiella pneumoniae - URINE CULTURE, REFLEX    AMOX/CLAVULANIC <=2 Sensitive     AMPICILLIN 4 Resistant     AMPICILLIN/SULBACTAM <=2 Sensitive     CEFAZOLIN* <=4 Not Reportable      * For infections other than uncomplicated UTI caused by E. coli, K. pneumoniae or P. mirabilis: Cefazolin is resistant if MIC > or = 8 mcg/mL. (Distinguishing susceptible versus intermediate for isolates with MIC < or = 4 mcg/mL requires additional testing.) For uncomplicated UTI caused by E. coli, K. pneumoniae or P. mirabilis: Cefazolin is susceptible if MIC <32 mcg/mL and predicts susceptible to the oral agents cefaclor, cefdinir, cefpodoxime, cefprozil, cefuroxime, cephalexin and loracarbef.     CEFTAZIDIME <=1 Sensitive     CEFEPIME <=1 Sensitive     CEFTRIAXONE <=1  Sensitive     CIPROFLOXACIN <=0.25 Sensitive     LEVOFLOXACIN <=0.12 Sensitive     GENTAMICIN <=1 Sensitive     IMIPENEM 1 Sensitive     NITROFURANTOIN 32 Sensitive     PIP/TAZO <=4 Sensitive     TOBRAMYCIN <=1 Sensitive     TRIMETH/SULFA* <=20 Sensitive      * For infections other than uncomplicated UTI caused by E. coli, K. pneumoniae or P. mirabilis: Cefazolin is resistant if MIC > or = 8 mcg/mL. (Distinguishing susceptible versus intermediate for isolates with MIC < or = 4 mcg/mL requires additional testing.) For uncomplicated UTI caused by E. coli, K. pneumoniae or P. mirabilis: Cefazolin is susceptible if MIC <32 mcg/mL and predicts susceptible to the oral agents cefaclor, cefdinir, cefpodoxime, cefprozil, cefuroxime, cephalexin and loracarbef. Legend: S = Susceptible  I = Intermediate R = Resistant  NS = Not susceptible * = Not tested  NR = Not reported **NN = See antimicrobic comments  Labs: CBC: Recent Labs  Lab 11/03/22 1003 11/04/22 0549 11/05/22 0534  WBC 7.2 8.8 10.1  HGB 13.5 14.1 14.3  HCT 41.5 44.4 44.9  MCV 91.2 94.3 95.3  PLT 308 306 308   Basic Metabolic Panel: Recent Labs  Lab 11/03/22 1003 11/05/22 0534  NA 141 139  K 3.8 4.0  CL 105 105  CO2 26 24  GLUCOSE 111* 105*  BUN 21 21  CREATININE 0.74 0.75  CALCIUM 8.7* 8.9   Liver Function Tests: Recent Labs  Lab 11/05/22 0534  AST 17  ALT 13  ALKPHOS 57  BILITOT 1.1  PROT 7.0  ALBUMIN 3.5   CBG: No results for input(s): "GLUCAP" in the last 168 hours.  Discharge time spent: less than 30 minutes.  Signed: Rickey Barbara, MD [facility] 11/05/2022

## 2022-11-05 NOTE — Progress Notes (Signed)
Mobility Specialist - Progress Note   11/05/22 0948  Mobility  Activity Ambulated with assistance in hallway  Level of Assistance Modified independent, requires aide device or extra time  Assistive Device None  Distance Ambulated (ft) 350 ft  Range of Motion/Exercises Active  Activity Response Tolerated well  Mobility Referral Yes  $Mobility charge 1 Mobility  Mobility Specialist Start Time (ACUTE ONLY) 0932  Mobility Specialist Stop Time (ACUTE ONLY) 0940  Mobility Specialist Time Calculation (min) (ACUTE ONLY) 8 min   Pt received in bed and agreed to mobility. Had no issues and returned to bed with all needs met.  Marilynne Halsted Mobility Specialist

## 2022-11-05 NOTE — TOC Initial Note (Addendum)
Transition of Care [facility]) - Initial/Assessment Note    Patient Details  Name: [name] Little MRN: [id number] Date of Birth: 06-10-1933  [facility]) CM/SW Contact:    Adrian Prows, RN Phone Number: 11/05/2022, 10:27 AM  Clinical Narrative:                 Spoke w/ pt and son Homero Fellers in room; pt says she lives at home; she plans to return at d/c; she denies SDOH risks; pt says she has transportation; pt has glasses; she does not have DME, HH services, or home oxygen; pt says she has grab bars in her shower; no TOC needs identified.  Expected Discharge Plan: Home/Self Care Barriers to Discharge: Continued Medical Work up   Patient Goals and CMS Choice Patient states their goals for this hospitalization and ongoing recovery are:: home          Expected Discharge Plan and Services   Discharge Planning Services: CM Consult   Living arrangements for the past 2 months: Single Family Home                                      Prior Living Arrangements/Services Living arrangements for the past 2 months: Single Family Home Lives with:: Spouse Patient language and need for interpreter reviewed:: Yes Do you feel safe going back to the place where you live?: Yes      Need for Family Participation in Patient Care: Yes (Comment) Care giver support system in place?: Yes (comment) Current home services:  (n/a) Criminal Activity/Legal Involvement Pertinent to Current Situation/Hospitalization: No - Comment as needed  Activities of Daily Living   ADL Screening (condition at time of admission) Independently performs ADLs?: No Does the patient have a NEW difficulty with bathing/dressing/toileting/self-feeding that is expected to last >3 days?: No Does the patient have a NEW difficulty with getting in/out of bed, walking, or climbing stairs that is expected to last >3 days?: No Does the patient have a NEW difficulty with communication that is expected to  last >3 days?: No Is the patient deaf or have difficulty hearing?: Yes Does the patient have difficulty seeing, even when wearing glasses/contacts?: No Does the patient have difficulty concentrating, remembering, or making decisions?: No  Permission Sought/Granted Permission sought to share information with : Case Manager Permission granted to share information with : Yes, Verbal Permission Granted  Share Information with NAME: Case Manager     Permission granted to share info w Relationship: Jessilynn Foye (spouse) [phone] / Arbedella Callins (son) [phone]     Emotional Assessment Appearance:: Appears stated age Attitude/Demeanor/Rapport: Gracious Affect (typically observed): Accepting Orientation: : Oriented to Self, Oriented to Place, Oriented to  Time, Oriented to Situation Alcohol / Substance Use: Not Applicable Psych Involvement: No (comment)  Admission diagnosis:  Acute lower GI bleeding [K92.2] Lower GI bleed [K92.2] 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   Hematuria 01/29/2018   Hand injury 10/27/2017   Laceration of  right hand with infection 10/26/2017   Kidney stone 09/17/2017   Aortic atherosclerosis (HCC) 08/18/2017   Mitral valve regurgitation 09/01/2016   History of UTI 08/22/2016   Lower extremity edema 08/22/2016   BCC (basal cell carcinoma), scalp/neck 09/27/2015   Pain in limb 05/30/2015   Low back pain 03/26/2014   Type 2 diabetes mellitus with obesity (HCC) 02/26/2014   Nocturia  02/26/2014   Obesity (BMI 30-39.9) 02/03/2014   Anxiety state 09/22/2013   FH: factor V Leiden mutation 08/19/2013   Preventative health care 04/02/2013   Encounter for therapeutic drug monitoring 03/26/2013   Anticoagulant long-term use 08/23/2010   CALLUS, TOE 01/05/2010   CHEST PAIN 08/23/2009   Malignant neoplasm of female breast (HCC) 07/27/2009   PHLEBITIS&THROMBOPHLEB SUP VESSELS LOWER EXTREM 12/11/2007   HEMORRHOIDS 12/09/2007   History of colonic polyps 12/09/2007   Essential hypertension 09/18/2007   Venous (peripheral) insufficiency 07/02/2007   Hyperlipidemia 06/04/2007   Paroxysmal supraventricular tachycardia (HCC) 03/28/2007   Aneurysm of thoracic aorta (HCC) 11/25/2006   DVT, HX OF 08/11/2006   PCP:  Donato Schultz, DO Pharmacy:   [facility] [phone] - [place], [place] - [address] [place] [address] Phone: [phone] Fax: [phone]     Social Determinants of Health (SDOH) Social History: SDOH Screenings   Food Insecurity: No Food Insecurity (11/05/2022)  Housing: Low Risk  (11/05/2022)  Transportation Needs: No Transportation Needs (11/05/2022)  Utilities: Not At Risk (11/05/2022)  Depression (PHQ2-9): Low Risk  (04/24/2022)  Tobacco Use: Low Risk  (11/04/2022)   SDOH Interventions: Food Insecurity Interventions: Intervention Not Indicated, Inpatient TOC Housing Interventions: Intervention Not Indicated, Inpatient TOC Transportation Interventions: Intervention Not Indicated, Inpatient TOC   Readmission Risk Interventions     No data to display

## 2022-11-05 NOTE — Plan of Care (Signed)
Problem: Education: Goal: Knowledge of General Education information will improve Description: Including pain rating scale, medication(s)/side effects and non-pharmacologic comfort measures Outcome: Progressing   Problem: Health Behavior/Discharge Planning: Goal: Ability to manage health-related needs will improve Outcome: Progressing   Problem: Clinical Measurements: Goal: Ability to maintain clinical measurements within normal limits will improve Outcome: Progressing Goal: Will remain free from infection Outcome: Progressing   Problem: Nutrition: Goal: Adequate nutrition will be maintained Outcome: Progressing   Problem: Elimination: Goal: Will not experience complications related to bowel motility Outcome: Progressing   Problem: Pain Managment: Goal: General experience of comfort will improve Outcome: Progressing   Problem: Safety: Goal: Ability to remain free from injury will improve Outcome: Progressing   Problem: Skin Integrity: Goal: Risk for impaired skin integrity will decrease Outcome: Progressing

## 2022-11-05 NOTE — Care Management Obs Status (Signed)
MEDICARE OBSERVATION STATUS NOTIFICATION   Patient Details  Name: [name] Little MRN: [id number] Date of Birth: [birth year]   Medicare Observation Status Notification Given:  Yes    Adrian Prows, RN 11/05/2022, 10:05 AM

## 2022-11-06 ENCOUNTER — Telehealth: Payer: Self-pay

## 2022-11-06 NOTE — Telephone Encounter (Signed)
Pt reports she was in the hospital from 10/4-10/6 for GI bleed. She was told she had a polyp that was infected and it started to bleed. She denies any bleeding currently. Pt reports she was advised with d/c to restart warfarin tomorrow. Advised pt to restart current dosing and if any bleeding or abnormal bruising to go to ER. RS coumadin clinic apt for 1 week, 10/15. Pt agreed to new apt.   Pt verbalized understanding.   Review of hospital notes report INR in range when bleed occurred and on 10/5 her INR was subtherapeutic at 1.8. It will take at least one week of warfarin dosing to resume normal INR range. Pt has been scheduled for 1 week.

## 2022-11-07 ENCOUNTER — Ambulatory Visit: Payer: Medicare PPO | Admitting: Family Medicine

## 2022-11-07 ENCOUNTER — Ambulatory Visit (HOSPITAL_BASED_OUTPATIENT_CLINIC_OR_DEPARTMENT_OTHER)
Admission: RE | Admit: 2022-11-07 | Discharge: 2022-11-07 | Disposition: A | Discharge status: Home / Self Care | Payer: Medicare PPO | Source: Ambulatory Visit | Attending: Family Medicine | Admitting: Family Medicine

## 2022-11-07 ENCOUNTER — Telehealth: Payer: Self-pay

## 2022-11-07 ENCOUNTER — Encounter (HOSPITAL_BASED_OUTPATIENT_CLINIC_OR_DEPARTMENT_OTHER): Payer: Self-pay

## 2022-11-07 ENCOUNTER — Encounter: Payer: Self-pay | Admitting: Family Medicine

## 2022-11-07 VITALS — BP 120/70 | HR 69 | Temp 98.0°F | Resp 18 | Ht 63.0 in | Wt 169.0 lb

## 2022-11-07 DIAGNOSIS — N200 Calculus of kidney: Secondary | ICD-10-CM | POA: Diagnosis not present

## 2022-11-07 DIAGNOSIS — Z7901 Long term (current) use of anticoagulants: Secondary | ICD-10-CM

## 2022-11-07 DIAGNOSIS — R1032 Left lower quadrant pain: Secondary | ICD-10-CM

## 2022-11-07 DIAGNOSIS — K5730 Diverticulosis of large intestine without perforation or abscess without bleeding: Secondary | ICD-10-CM | POA: Diagnosis not present

## 2022-11-07 DIAGNOSIS — K625 Hemorrhage of anus and rectum: Secondary | ICD-10-CM

## 2022-11-07 DIAGNOSIS — K8020 Calculus of gallbladder without cholecystitis without obstruction: Secondary | ICD-10-CM | POA: Diagnosis not present

## 2022-11-07 LAB — COMPREHENSIVE METABOLIC PANEL
ALT: 10 U/L (ref 0–35)
AST: 14 U/L (ref 0–37)
Albumin: 4 g/dL (ref 3.5–5.2)
Alkaline Phosphatase: 66 U/L (ref 39–117)
BUN: 36 mg/dL — ABNORMAL HIGH (ref 6–23)
CO2: 27 meq/L (ref 19–32)
Calcium: 9.6 mg/dL (ref 8.4–10.5)
Chloride: 106 meq/L (ref 96–112)
Creatinine, Ser: 0.96 mg/dL (ref 0.40–1.20)
GFR: 53.43 mL/min — ABNORMAL LOW (ref >60.00)
Glucose, Bld: 127 mg/dL — ABNORMAL HIGH (ref 70–99)
Potassium: 4.4 meq/L (ref 3.5–5.1)
Sodium: 139 meq/L (ref 135–145)
Total Bilirubin: 0.7 mg/dL (ref 0.2–1.2)
Total Protein: 6.5 g/dL (ref 6.0–8.3)

## 2022-11-07 LAB — CBC WITH DIFFERENTIAL/PLATELET
Basophils Absolute: 0.1 10*3/uL (ref 0.0–0.1)
Basophils Relative: 0.9 % (ref 0.0–3.0)
Eosinophils Absolute: 0.1 10*3/uL (ref 0.0–0.7)
Eosinophils Relative: 1.7 % (ref 0.0–5.0)
HCT: 44.9 % (ref 36.0–46.0)
Hemoglobin: 14.4 g/dL (ref 12.0–15.0)
Lymphocytes Relative: 39.6 % (ref 12.0–46.0)
Lymphs Abs: 3.2 10*3/uL (ref 0.7–4.0)
MCHC: 32.1 g/dL (ref 30.0–36.0)
MCV: 92.4 fL (ref 78.0–100.0)
Monocytes Absolute: 0.9 10*3/uL (ref 0.1–1.0)
Monocytes Relative: 10.6 % (ref 3.0–12.0)
Neutro Abs: 3.8 10*3/uL (ref 1.4–7.7)
Neutrophils Relative %: 47.2 % (ref 43.0–77.0)
Platelets: 319 10*3/uL (ref 150.0–400.0)
RBC: 4.87 Mil/uL (ref 3.87–5.11)
RDW: 14.8 % (ref 11.5–15.5)
WBC: 8.1 10*3/uL (ref 4.0–10.5)

## 2022-11-07 LAB — PROTIME-INR
INR: 1.2 {ratio} — ABNORMAL HIGH (ref 0.8–1.0)
Prothrombin Time: 12.9 s (ref 9.6–13.1)

## 2022-11-07 MED ORDER — IOHEXOL 300 MG/ML  SOLN
100.0000 mL | Freq: Once | INTRAMUSCULAR | Status: AC | PRN
  Administered 2022-11-07: 100 mL via INTRAVENOUS

## 2022-11-07 NOTE — Progress Notes (Signed)
Established Patient Office Visit  Subjective   Patient ID: [name] Little, female    DOB: [birth year]  Age: 89 y.o. MRN: [id number]  Chief Complaint  Patient presents with   Hospitalization Follow-up    Pt states bleeding started again this morning. Pt states having achy pain in rectum    HPI Discussed the use of AI scribe software for clinical note transcription with the patient, who gave verbal consent to proceed.  History of Present Illness   The patient, with a history of anticoagulation therapy with Coumadin, presents after a recent hospital admission due to gastrointestinal bleeding. The bleeding had initially stopped but has since recurred. The patient also reports abdominal pain, localized to the mid-abdomen. The patient has stopped taking Coumadin, which was initially prescribed for a clotting issue.  She was supposed to restart it today but when she saw the blood in the toilet again she did not take it and called to come in .      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   Hematuria 01/29/2018   Hand injury 10/27/2017   Laceration of right hand with infection 10/26/2017   Kidney stone 09/17/2017   Aortic atherosclerosis (HCC) 08/18/2017   Mitral valve regurgitation 09/01/2016   History of UTI 08/22/2016   Lower extremity edema 08/22/2016   BCC (basal cell carcinoma), scalp/neck 09/27/2015   Pain in limb 05/30/2015   Low back pain 03/26/2014   Type 2 diabetes mellitus with obesity (HCC) 02/26/2014    Nocturia 02/26/2014   Obesity (BMI 30-39.9) 02/03/2014   Anxiety state 09/22/2013   FH: factor V Leiden mutation 08/19/2013   Preventative health care 04/02/2013   Encounter for therapeutic drug monitoring 03/26/2013   Anticoagulant long-term use 08/23/2010   CALLUS, TOE 01/05/2010   CHEST PAIN 08/23/2009   Malignant neoplasm of female breast (HCC) 07/27/2009   PHLEBITIS&THROMBOPHLEB SUP VESSELS LOWER EXTREM 12/11/2007   HEMORRHOIDS 12/09/2007   History of colonic polyps 12/09/2007   Essential hypertension 09/18/2007   Venous (peripheral) insufficiency 07/02/2007   Hyperlipidemia 06/04/2007   Paroxysmal supraventricular tachycardia (HCC) 03/28/2007   Aneurysm of thoracic aorta (HCC) 11/25/2006   DVT, HX OF 08/11/2006   Past Medical History:  Diagnosis Date   Anticoagulated on Coumadin    long-term for factor V---  managed by coumadin clinic and pcp   Diet-controlled type 2 diabetes mellitus (HCC)    followed by pcp   Factor V deficiency (HCC) 1998   anticoagulated on coumadin   GAD (generalized anxiety disorder)    Heart murmur    Hemorrhoids    History of avascular necrosis of capital femoral epiphysis 1999   s/p THA right   History of basal cell carcinoma (BCC) excision    scalp/ neck   History of Clostridium difficile infection    History of DVT of lower extremity 1998;  04/ 2006   LLE   History of external beam radiation therapy 2010   right breast cancer   History of ITP    per pt hx long-term use prednisone for ITP,  1998 s/p total splenctomy   History of kidney stones    History of melanoma 1980s   s/p  right calf melanoma excision , per pt not malignant and localized ,  no recurrence   History of paroxysmal supraventricular tachycardia    (02-23-2020  pt stated have not had any issues / symptoms in several years   History of pulmonary embolus (PE) 04/2004   History of right breast cancer 2010   08-10-2008 s/p  right lumpectomy w/ sln bx (partial mastectomy) ,  ER+, Stage I,  completed radiation 2010,  per pt no recurrance   Hypertension    Macular degeneration, bilateral    mild   Mitral valve regurgitation 09/01/2016   cardiology-- dr Eden Emms--- per note mild to moderate without stenosis,     Mixed hyperlipidemia 05/18/2015   Nocturia    OA (osteoarthritis)    lumbar stenosis, radiculopathy, OA- L hip   Personal history of colonic polyps    hyperplastic   Renal calculus, right    Right ureteral calculus    Thoracic ascending aortic aneurysm (HCC)    last CT angio chest in epic 11-29-2016  , 3.7cm   Past Surgical History:  Procedure Laterality Date   ABDOMINAL HYSTERECTOMY  1973   BREAST LUMPECTOMY WITH RADIOACTIVE SEED AND SENTINEL LYMPH NODE BIOPSY Right 08/10/2008   @[facility]    CATARACT EXTRACTION W/ INTRAOCULAR LENS  IMPLANT, BILATERAL Bilateral 2001   CYSTOSCOPY WITH RETROGRADE PYELOGRAM, URETEROSCOPY AND STENT PLACEMENT Right 02/25/2020   Procedure: CYSTOSCOPY WITH RETROGRADE PYELOGRAM, URETEROSCOPY AND STENT EXCHANGE;  Surgeon: Sebastian Ache, MD;  Location: [facility];  Service: Urology;  Laterality: Right;  75 MINS   CYSTOSCOPY WITH STENT PLACEMENT Right 01/27/2020   Procedure: CYSTOSCOPY WITH STENT PLACEMENT;  Surgeon: Sebastian Ache, MD;  Location: [facility];  Service: Urology;  Laterality: Right;   CYSTOSCOPY/URETEROSCOPY/HOLMIUM LASER/STENT PLACEMENT Left 06-27-2018  dr Logan Bores,  @[facility]    HOLMIUM LASER APPLICATION Right 02/25/2020   Procedure: HOLMIUM LASER APPLICATION;  Surgeon: Sebastian Ache, MD;  Location: [facility];  Service: Urology;  Laterality: Right;   I & D EXTREMITY Right 10/26/2017   Procedure: IRRIGATION AND DEBRIDEMENT RIGHT HAND;  Surgeon: Bradly Bienenstock, MD;  Location: [facility] OR;  Service: Orthopedics;  Laterality: Right;   LUMBAR LAMINECTOMY/DECOMPRESSION MICRODISCECTOMY  04/11/2011   Procedure: LUMBAR LAMINECTOMY/DECOMPRESSION MICRODISCECTOMY;  Surgeon: Barnett Abu, MD;  Location: [facility];  Service: Neurosurgery;  Laterality: N/A;  Lumbar Five-Sacral One Microdiscectomy   MELANOMA EXCISION Right 1980s   right calf   SPLENECTOMY, TOTAL  1998   TOTAL HIP ARTHROPLASTY Right 1999   UPPER GASTROINTESTINAL ENDOSCOPY     Social History   Tobacco Use   Smoking status: Never   Smokeless tobacco: Never  Vaping Use   Vaping status: Never Used  Substance Use Topics   Alcohol use: No    Alcohol/week: 0.0 standard drinks of alcohol   Drug use: Never   Social History   Socioeconomic History   Marital status: Married    Spouse name: Not on file   Number of children: 4   Years of education: Not on file   Highest education level: Not on file  Occupational History   Occupation: Retired  Tobacco Use   Smoking status: Never   Smokeless tobacco: Never  Vaping Use   Vaping status: Never Used  Substance and Sexual Activity   Alcohol use: No    Alcohol/week: 0.0 standard drinks of alcohol   Drug use:  Never   Sexual activity: Not on file  Other Topics Concern   Not on file  Social History Narrative   Married 1955   2 sons - '59, '61, 2 daughters '55, '57; 8 grandchildren; 1 great grand   I-ADLs      Social Determinants of Health   Financial Resource Strain: Not on file  Food Insecurity: No Food Insecurity (11/05/2022)   Hunger Vital Sign    Worried About Running Out of Food in the Last Year: Never true    Ran Out of Food in the Last Year: Never true  Transportation Needs: No Transportation Needs (11/05/2022)   PRAPARE - Administrator, Civil Service (Medical): No    Lack of Transportation (Non-Medical): No  Physical Activity: Not on file  Stress: Not on file  Social Connections: Not on file  Intimate Partner Violence: Not At Risk (11/05/2022)   Humiliation, Afraid, Rape, and Kick questionnaire    Fear of Current or Ex-Partner: No    Emotionally Abused: No    Physically Abused: No    Sexually Abused: No   Family Status  Relation Name Status    Mother  Deceased   Father  Deceased   Sister 77 Deceased   Brother 85 Deceased       BLOOD CLOT IN BRAIN   Sister 49 Deceased   Other  (Not Specified)   Mat Uncle  (Not Specified)   Oceanographer  (Not Specified)   Sister 28 Alive   Neg Hx  (Not Specified)  No partnership data on file   Family History  Problem Relation Age of Onset   Heart disease Mother    Heart disease Father    Alzheimer's disease Sister    Dementia Sister    Dementia Sister    Heart disease Other        uncles   Clotting disorder Maternal Uncle    Breast cancer Paternal Aunt    Cancer Paternal Aunt        breast   Stroke Sister    Dementia Sister        mean, carotid artery disease   Colon cancer Neg Hx    Anesthesia problems Neg Hx    Hypotension Neg Hx    Malignant hyperthermia Neg Hx    Pseudochol deficiency Neg Hx    Allergies  Allergen Reactions   Amlodipine Swelling    "my whole body swelled"   Carvedilol Swelling    "my whole body swelled"   Chlorthalidone     Patient reported extreme weakness.   Hydralazine Other (See Comments)    Joint and chest pain   Statins Other (See Comments)    Weakness, pain Lipitor and Pravachol Weakness, pain Lipitor and Pravachol   Zetia [Ezetimibe]     Unable to walk, severe leg pain   Cefdinir     On blood thinnner   Lyrica [Pregabalin] Other (See Comments)    Weakness and joint pain   Cefuroxime Axetil     REACTION: Ddoes not remember reaction   Sulfonamide Derivatives Rash    REACTION: Rash      Review of Systems  Constitutional:  Negative for fever and malaise/fatigue.  HENT:  Negative for congestion.   Eyes:  Negative for blurred vision.  Respiratory:  Negative for shortness of breath.   Cardiovascular:  Negative for chest pain, palpitations and leg swelling.  Gastrointestinal:  Positive for abdominal pain and blood in stool. Negative for nausea.  Genitourinary:  Negative for dysuria and frequency.  Musculoskeletal:  Negative for falls.   Skin:  Negative for rash.  Neurological:  Negative for dizziness, loss of consciousness and headaches.  Endo/Heme/Allergies:  Negative for environmental allergies.  Psychiatric/Behavioral:  Negative for depression. The patient is not nervous/anxious.       Objective:     BP 120/70 (BP Location: Left Arm, Patient Position: Sitting, Cuff Size: Normal)   Pulse 69   Temp 98 F (36.7 C) (Oral)   Resp 18   Ht 5\' 3"  (1.6 m)   Wt 169 lb (76.7 kg)   SpO2 98%   BMI 29.94 kg/m  BP Readings from Last 3 Encounters:  11/07/22 120/70  11/05/22 (!) 152/67  09/11/22 (!) 152/90   Wt Readings from Last 3 Encounters:  11/07/22 169 lb (76.7 kg)  11/03/22 163 lb (73.9 kg)  09/11/22 168 lb 3.2 oz (76.3 kg)   SpO2 Readings from Last 3 Encounters:  11/07/22 98%  11/05/22 93%  09/11/22 98%      Physical Exam Vitals and nursing note reviewed. Exam conducted with a chaperone present.  Constitutional:      General: She is not in acute distress.    Appearance: Normal appearance. She is well-developed.  HENT:     Head: Normocephalic and atraumatic.  Eyes:     General: No scleral icterus.       Right eye: No discharge.        Left eye: No discharge.  Cardiovascular:     Rate and Rhythm: Normal rate and regular rhythm.     Heart sounds: No murmur heard. Pulmonary:     Effort: Pulmonary effort is normal. No respiratory distress.     Breath sounds: Normal breath sounds.  Abdominal:     Tenderness: There is abdominal tenderness in the left lower quadrant. There is guarding. There is no right CVA tenderness, left CVA tenderness or rebound.  Musculoskeletal:        General: Normal range of motion.     Cervical back: Normal range of motion and neck supple.     Right lower leg: No edema.     Left lower leg: No edema.  Skin:    General: Skin is warm and dry.  Neurological:     Mental Status: She is alert and oriented to person, place, and time.  Psychiatric:        Mood and Affect: Mood  normal.        Behavior: Behavior normal.        Thought Content: Thought content normal.        Judgment: Judgment normal.      No results found for any visits on 11/07/22.  Last CBC Lab Results  Component Value Date   WBC 10.1 11/05/2022   HGB 14.3 11/05/2022   HCT 44.9 11/05/2022   MCV 95.3 11/05/2022   MCH 30.4 11/05/2022   RDW 15.0 11/05/2022   PLT 308 11/05/2022   Last metabolic panel Lab Results  Component Value Date   GLUCOSE 105 (H) 11/05/2022   NA 139 11/05/2022   K 4.0 11/05/2022   CL 105 11/05/2022   CO2 24 11/05/2022   BUN 21 11/05/2022   CREATININE 0.75 11/05/2022   GFRNONAA >60 11/05/2022   CALCIUM 8.9 11/05/2022   PHOS 3.0 11/19/2013   PROT 7.0 11/05/2022   ALBUMIN 3.5 11/05/2022   BILITOT 1.1 11/05/2022   ALKPHOS 57 11/05/2022   AST 17 11/05/2022   ALT 13  11/05/2022   ANIONGAP 10 11/05/2022   Last lipids Lab Results  Component Value Date   CHOL 154 04/24/2022   HDL 34.40 (L) 04/24/2022   LDLCALC 86 04/24/2022   LDLDIRECT 144.0 02/09/2022   TRIG 167.0 (H) 04/24/2022   CHOLHDL 4 04/24/2022   Last hemoglobin A1c Lab Results  Component Value Date   HGBA1C 6.7 (H) 04/24/2022   Last thyroid functions Lab Results  Component Value Date   TSH 3.57 04/24/2022   Last vitamin D Lab Results  Component Value Date   VD25OH 26.23 (L) 07/14/2021   Last vitamin B12 and Folate Lab Results  Component Value Date   VITAMINB12 622 02/09/2022   FOLATE 15.9 02/04/2008      The ASCVD Risk score (Arnett DK, et al., 2019) failed to calculate for the following reasons:   The 2019 ASCVD risk score is only valid for ages 66 to 66    Assessment & Plan:   Problem List Items Addressed This Visit   None Visit Diagnoses     Rectal bleed    -  Primary   Relevant Orders   Ambulatory referral to Gastroenterology   Left lower quadrant abdominal pain       Relevant Orders   CT ABDOMEN PELVIS W CONTRAST   CBC with Differential/Platelet    Comprehensive metabolic panel   On continuous oral anticoagulation       Relevant Orders   Protime-INR     Assessment and Plan    Gastrointestinal Bleeding Recurrent bleeding with a history of diverticulosis. Patient was on Coumadin, which has been stopped due to bleeding. Hemoglobin was stable at discharge from the hospital. Left lower quadrant pain present, raising concern for diverticulitis. -Order urgent CT scan to evaluate for diverticulitis. -Order blood work including PT/INR and hemoglobin. -If hemoglobin drops significantly or bleeding worsens, advise patient to go to the emergency room. -Place urgent referral to gastroenterology for further workup. -If hemoglobin remains stable, follow up with gastroenterology and monitor hemoglobin levels.  Anticoagulation Patient was on Coumadin for a clotting issue, but it has been stopped due to bleeding. -Monitor PT/INR to ensure it returns to normal. -Advise patient to watch for signs of clotting (severe pain in extremities, severe headache, chest pain, shortness of breath) and to go to the emergency room if these occur. -Communicate with Coumadin clinic about current status and plan.        No follow-ups on file.    Donato Schultz, DO

## 2022-11-07 NOTE — Telephone Encounter (Signed)
Initial Comment Caller states she has rectal bleeding which started again this morning. Translation No Nurse Assessment Nurse: Caryn Bee, RN, [place] Date/Time (Eastern Time): 11/07/2022 9:01:14 AM Confirm and document reason for call. If symptomatic, describe symptoms. ---Patient was discharged on Sunday from the hospital. She was seen for rectal bleeding. PT was diagnosed with an infected or burst polyp. PT started bleeding again this morning. Does the patient have any new or worsening symptoms? ---Yes Will a triage be completed? ---Yes Related visit to physician within the last 2 weeks? ---Yes Does the PT have any chronic conditions? (i.e. diabetes, asthma, this includes High risk factors for pregnancy, etc.) ---Yes List chronic conditions. ---On Warfarin. HTN. Is this a behavioral health or substance abuse call? ---No Guidelines Guideline Title Affirmed Question Affirmed Notes Nurse Date/Time (Eastern Time) Rectal Bleeding MODERATE rectal bleeding (e.g., small blood clots, passing blood without stool, or toilet water turns red) Jurovschi, RN, Ayrine 11/07/2022 9:03:19 AM PLEASE NOTE: All timestamps contained within this report are represented as [place] Standard Time. CONFIDENTIALTY NOTICE: This fax transmission is intended only for the addressee. It contains information that is legally privileged, confidential or otherwise protected from use or disclosure. If you are not the intended recipient, you are strictly prohibited from reviewing, disclosing, copying using or disseminating any of this information or taking any action in reliance on or regarding this information. If you have received this fax in error, please notify us immediately by telephone so that we can arrange for its return to [place]. Phone: [phone], Toll-Free: [phone], Fax: [phone] Page: 2 of 2 Call Id: [id number] Disp. Time Lamount Cohen Time) Disposition Final User 11/07/2022 9:09:56 AM See PCP within  24 Hours Yes Jurovschi, RN, [place] [facility] Final Disposition 11/07/2022 9:09:56 AM See PCP within 24 Hours Yes Jurovschi, RN, Desma Mcgregor Disagree/Comply Comply Caller Understands Yes PreDisposition Call Doctor Care Advice Given Per Guideline SEE PCP WITHIN 24 HOURS: * IF OFFICE WILL BE OPEN: You need to be examined within the next 24 hours. Call your doctor (or NP/PA) when the office opens and make an appointment. CALL BACK IF: * Dizziness occurs * Bleeding increases * You become worse CARE ADVICE given per Rectal Bleeding (Adult) guideline. Comments User: Mickle Mallory, RN Date/Time Lamount Cohen Time): 11/07/2022 9:09:02 AM Pt hasn't taken warfarin since 11/02/22 Referrals REFERRED TO PCP OFFICE

## 2022-11-07 NOTE — Telephone Encounter (Signed)
FYI- appt w/ Dr. Laury Axon today

## 2022-11-08 ENCOUNTER — Encounter: Payer: Self-pay | Admitting: Family Medicine

## 2022-11-09 ENCOUNTER — Encounter: Payer: Self-pay | Admitting: Gastroenterology

## 2022-11-09 ENCOUNTER — Encounter: Payer: Self-pay | Admitting: Internal Medicine

## 2022-11-09 ENCOUNTER — Ambulatory Visit: Payer: Medicare PPO | Admitting: Gastroenterology

## 2022-11-09 VITALS — BP 128/60 | HR 84 | Ht 62.0 in | Wt 170.1 lb

## 2022-11-09 DIAGNOSIS — K625 Hemorrhage of anus and rectum: Secondary | ICD-10-CM

## 2022-11-09 NOTE — Progress Notes (Addendum)
11/09/2022 [name] Little [id number] [birth year]   HISTORY OF PRESENT ILLNESS:  This is an 89 year old female who is a patient of Dr. Marvell Fuller.  She presents here today with GI bleed.  Was hospitalized for couple of days over this past weekend and thought to be diverticular (seen by our service), CTA negative for active bleeding source.  Hemoglobin normal so was discharged.  Bleeding started again at home.  Regular CT scan was done just a couple of days ago.  She is supposed to be on Coumadin, but has been off of that for close to a week now.  Hemoglobin still okay on recheck 2 days ago.  At this point is only occurring with bowel movements, but appears to be at least a moderate amount in the toilet bowl from pictures that she shared today.   Past Medical History:  Diagnosis Date   Anticoagulated on Coumadin    long-term for factor V---  managed by coumadin clinic and pcp   Diet-controlled type 2 diabetes mellitus (HCC)    followed by pcp   Factor V deficiency (HCC) 1998   anticoagulated on coumadin   GAD (generalized anxiety disorder)    Heart murmur    Hemorrhoids    History of avascular necrosis of capital femoral epiphysis 1999   s/p THA right   History of basal cell carcinoma (BCC) excision    scalp/ neck   History of Clostridium difficile infection    History of DVT of lower extremity 1998;  04/ 2006   LLE   History of external beam radiation therapy 2010   right breast cancer   History of ITP    per pt hx long-term use prednisone for ITP,  1998 s/p total splenctomy   History of kidney stones    History of melanoma 1980s   s/p  right calf melanoma excision , per pt not malignant and localized ,  no recurrence   History of paroxysmal supraventricular tachycardia    (02-23-2020  pt stated have not had any issues / symptoms in several years   History of pulmonary embolus (PE) 04/2004   History of right breast cancer 2010   08-10-2008 s/p  right lumpectomy w/ sln  bx (partial mastectomy) , ER+, Stage I,  completed radiation 2010,  per pt no recurrance   Hypertension    Macular degeneration, bilateral    mild   Mitral valve regurgitation 09/01/2016   cardiology-- dr Eden Emms--- per note mild to moderate without stenosis,     Mixed hyperlipidemia 05/18/2015   Nocturia    OA (osteoarthritis)    lumbar stenosis, radiculopathy, OA- L hip   Personal history of colonic polyps    hyperplastic   Renal calculus, right    Right ureteral calculus    Thoracic ascending aortic aneurysm (HCC)    last CT angio chest in epic 11-29-2016  , 3.7cm   Past Surgical History:  Procedure Laterality Date   ABDOMINAL HYSTERECTOMY  1973   BREAST LUMPECTOMY WITH RADIOACTIVE SEED AND SENTINEL LYMPH NODE BIOPSY Right 08/10/2008   @[facility]    CATARACT EXTRACTION W/ INTRAOCULAR LENS  IMPLANT, BILATERAL Bilateral 2001   CYSTOSCOPY WITH RETROGRADE PYELOGRAM, URETEROSCOPY AND STENT PLACEMENT Right 02/25/2020   Procedure: CYSTOSCOPY WITH RETROGRADE PYELOGRAM, URETEROSCOPY AND STENT EXCHANGE;  Surgeon: Sebastian Ache, MD;  Location: [facility];  Service: Urology;  Laterality: Right;  75 MINS   CYSTOSCOPY WITH STENT PLACEMENT Right 01/27/2020   Procedure: CYSTOSCOPY WITH STENT PLACEMENT;  Surgeon: Sebastian Ache, MD;  Location: [facility];  Service: Urology;  Laterality: Right;   CYSTOSCOPY/URETEROSCOPY/HOLMIUM LASER/STENT PLACEMENT Left 06-27-2018  dr Logan Bores,  @[facility]    HOLMIUM LASER APPLICATION Right 02/25/2020   Procedure: HOLMIUM LASER APPLICATION;  Surgeon: Sebastian Ache, MD;  Location: [facility];  Service: Urology;  Laterality: Right;   I & D EXTREMITY Right 10/26/2017   Procedure: IRRIGATION AND DEBRIDEMENT RIGHT HAND;  Surgeon: Bradly Bienenstock, MD;  Location: [facility] OR;  Service: Orthopedics;  Laterality: Right;   LUMBAR LAMINECTOMY/DECOMPRESSION MICRODISCECTOMY  04/11/2011   Procedure: LUMBAR LAMINECTOMY/DECOMPRESSION MICRODISCECTOMY;  Surgeon: Barnett Abu, MD;  Location: [facility];  Service: Neurosurgery;  Laterality: N/A;  Lumbar Five-Sacral One Microdiscectomy   MELANOMA EXCISION Right 1980s   right calf   SPLENECTOMY, TOTAL  1998   TOTAL HIP ARTHROPLASTY Right 1999   UPPER GASTROINTESTINAL ENDOSCOPY      reports that she has never smoked. She has never used smokeless tobacco. She reports that she does not drink alcohol and does not use drugs. family history includes Alzheimer's disease in her sister; Breast cancer in her paternal aunt; Cancer in her paternal aunt; Clotting disorder in her maternal uncle; Dementia in her sister, sister, and sister; Heart disease in her father, mother, and another family member; Stroke in her sister. Allergies  Allergen Reactions   Amlodipine Swelling    "my whole body swelled"   Carvedilol Swelling    "my whole body swelled"   Chlorthalidone     Patient reported extreme weakness.   Hydralazine Other (See Comments)    Joint and chest pain   Statins Other (See Comments)    Weakness, pain Lipitor and Pravachol Weakness, pain Lipitor and Pravachol   Zetia [Ezetimibe]     Unable to walk, severe leg pain   Cefdinir     On blood thinnner   Lyrica [Pregabalin] Other (See Comments)    Weakness and joint pain   Cefuroxime Axetil     REACTION: Ddoes not remember reaction   Sulfonamide Derivatives Rash    REACTION: Rash      Outpatient Encounter Medications as of 11/09/2022  Medication Sig   amLODipine (NORVASC) 5 MG tablet Take 1 tablet (5 mg total) by mouth daily.   Nebivolol HCl 20 MG TABS Take 1 tablet (20 mg total) by mouth daily.   NONFORMULARY OR COMPOUNDED ITEM Compression socks  20-30 mm/hg #1  as directed   valsartan (DIOVAN) 320 MG tablet Take 1 tablet (320 mg total) by mouth daily.   Accu-Chek Softclix Lancets lancets Use to check sugar daily or as needed. (Patient not taking: Reported on 11/09/2022)   B Complex Vitamins (VITAMIN B COMPLEX PO) Take 1 tablet by mouth daily.   (Patient not taking: Reported on 11/09/2022)   Blood Pressure Monitoring (BLOOD PRESSURE KIT) DEVI 1 Device by Does not apply route daily as needed. Fill per patient insurance preference Dx: Hypertension (Patient not taking: Reported on 11/09/2022)   Cranberry 1000 MG CAPS Take 1 capsule by mouth 2 (two) times daily. (Patient not taking: Reported on 11/09/2022)   glucose blood (ACCU-CHEK GUIDE) test strip USE TO TEST BLOOD SUGAR UP TO 4 TIMES DAILY AS INSTRUCTED (Patient not taking: Reported on 11/09/2022)   warfarin (COUMADIN) 5 MG tablet TAKE 1 TABLET BY MOUTH DAILY EXCEPT TAKE 1/2 TABLET ON TUESDAYS, THURSDAY, SATURDAYS OR AS DIRECTED BY ANTICOAGULATION CLINIC (Patient not taking: Reported on 11/09/2022)   [DISCONTINUED] blood glucose meter kit and supplies KIT Dispense based  on patient and insurance preference. Use up to four times daily as directed. Dx: E11.9 (Patient not taking: Reported on 11/09/2022)   No facility-administered encounter medications on file as of 11/09/2022.    REVIEW OF SYSTEMS  : All other systems reviewed and negative except where noted in the History of Present Illness.   PHYSICAL EXAM: BP 128/60 (BP Location: Right Arm, Patient Position: Sitting, Cuff Size: Normal)   Pulse 84   Ht 5\' 2"  (1.575 m)   Wt 170 lb 2 oz (77.2 kg)   BMI 31.12 kg/m  General: Well developed white female in no acute distress Head: Normocephalic and atraumatic Eyes:  Sclerae anicteric, conjunctiva pink. Ears: Normal auditory acuity Lungs: Clear throughout to auscultation; no W/R/R. Heart: Regular rate and rhythm; no M/R/G. Musculoskeletal: Symmetrical with no gross deformities  Skin: No lesions on visible extremities Extremities: No edema  Neurological: Alert oriented x 4, grossly non-focal Psychological:  Alert and cooperative. Normal mood and affect  ASSESSMENT AND PLAN: *89 year old female with GI bleed.  Was hospitalized for couple of days over this past weekend and thought to be  diverticular, CTA negative for active bleeding source.  Hemoglobin normal so was discharged.  Bleeding started again at home.  Regular CT scan was done just a couple of days ago.  She is supposed to be on Coumadin, but has been off of that for close to a week now.  Hemoglobin still okay on recheck 2 days ago.  I suspect this is diverticular, but the fact that is not stopping it we probably need to proceed with colonoscopy.  I offered her an appointment for tomorrow with one of her other physicians, but she prefer to wait for Dr. Leone Payor so she is on for next Wednesday.  She is advised that if the bleeding worsens in the meantime, she becomes dizzy/lightheaded, etc. then she needs to go to the emergency department.  The risks, benefits, and alternatives to colonoscopy were discussed with the patient and she consents to proceed.     CC:  Donato Schultz, *  Discussed w/ [name] Little and agree w/ plan.  Iva Boop, MD, [facility] See Loretha Stapler on call - gastroenterology for best contact person 11/09/2022 4:42 PM

## 2022-11-09 NOTE — Patient Instructions (Signed)
You have been scheduled for a colonoscopy. Please follow written instructions given to you at your visit today.   Please pick up your prep supplies at the pharmacy within the next 1-3 days.  If you use inhalers (even only as needed), please bring them with you on the day of your procedure.  DO NOT TAKE 7 DAYS PRIOR TO TEST- Trulicity (dulaglutide) Ozempic, Wegovy (semaglutide) Mounjaro (tirzepatide) Bydureon Bcise (exanatide extended release)  DO NOT TAKE 1 DAY PRIOR TO YOUR TEST Rybelsus (semaglutide) Adlyxin (lixisenatide) Victoza (liraglutide) Byetta (exanatide)  _______________________________________________________  If your blood pressure at your visit was 140/90 or greater, please contact your primary care physician to follow up on this.  _______________________________________________________  If you are age 36 or older, your body mass index should be between 23-30. Your Body mass index is 31.12 kg/m. If this is out of the aforementioned range listed, please consider follow up with your Primary Care Provider.  If you are age 35 or younger, your body mass index should be between 19-25. Your Body mass index is 31.12 kg/m. If this is out of the aformentioned range listed, please consider follow up with your Primary Care Provider.   ________________________________________________________  The [facility] providers would like to encourage you to use [facility] to communicate with providers for non-urgent requests or questions.  Due to long hold times on the telephone, sending your provider a message by [facility] may be a faster and more efficient way to get a response.  Please allow 48 business hours for a response.  Please remember that this is for non-urgent requests.  _______________________________________________________

## 2022-11-11 ENCOUNTER — Encounter: Payer: Self-pay | Admitting: Certified Registered Nurse Anesthetist

## 2022-11-13 NOTE — Telephone Encounter (Signed)
Pt contacted coumadin clinic this morning reporting she is to restart warfarin. Pt reports last rectal bleeding was 10/11 in the AM and was a very small amount on the toilet tissue. She reports she was supposed to have a colonoscopy on 10/16 but she is going to call and cancel it. She does not want to proceed with the colonoscopy. Advised pt to restart warfarin at prior dosing and recheck INR on 10/18. Advised if any s/s of abnormal bruising or bleeding to go to ER. Advised a msg would be sent to PCP to advise of plan of care. Advised if any changes in instructions the coumadin clinic or PCP office would contact her. Pt agreed and verbalized understanding.   Pt called back 25 minutes later and reported she IS going to have the colonoscopy on 10/16,due to family insistence. Advised no warfarin until the day after the colonoscopy. Restart normal dosing on 10/17 unless advised by GI not to start warfarin. Rescheduled INR recheck for 10/25. Advised again if any s/s of abnormal bruising or bleeding or s/s of a clot to go to the ER or call 911. Pt verbalized understanding.

## 2022-11-13 NOTE — Telephone Encounter (Signed)
Pt wanted to let Dr. Laury Axon know that she is keeping her colonoscopy appointment which is 11/15/22

## 2022-11-14 ENCOUNTER — Ambulatory Visit: Payer: Medicare PPO

## 2022-11-15 ENCOUNTER — Ambulatory Visit (AMBULATORY_SURGERY_CENTER): Payer: Medicare PPO | Admitting: Internal Medicine

## 2022-11-15 ENCOUNTER — Encounter: Payer: Self-pay | Admitting: Internal Medicine

## 2022-11-15 VITALS — BP 119/67 | HR 63 | Temp 97.1°F | Resp 16 | Ht 62.0 in | Wt 170.0 lb

## 2022-11-15 DIAGNOSIS — K625 Hemorrhage of anus and rectum: Secondary | ICD-10-CM

## 2022-11-15 DIAGNOSIS — K648 Other hemorrhoids: Secondary | ICD-10-CM | POA: Diagnosis not present

## 2022-11-15 DIAGNOSIS — K5730 Diverticulosis of large intestine without perforation or abscess without bleeding: Secondary | ICD-10-CM | POA: Diagnosis not present

## 2022-11-15 MED ORDER — SODIUM CHLORIDE 0.9 % IV SOLN: 500.0000 mL | Freq: Once | INTRAVENOUS | Status: DC

## 2022-11-15 NOTE — Progress Notes (Signed)
History and Physical Interval Note:  11/15/2022 1:25 PM  [name] Little  has presented today for endoscopic procedure(s), with the diagnosis of  Encounter Diagnosis  Name Primary?   Rectal bleeding Yes  .  The various methods of evaluation and treatment have been discussed with the patient and/or family. After consideration of risks, benefits and other options for treatment, the patient has consented to  the endoscopic procedure(s).   The patient's history has been reviewed, patient examined, no change in status, stable for endoscopic procedure(s).  I have reviewed the patient's chart and labs.  Questions were answered to the patient's satisfaction.     Iva Boop, MD, [facility]

## 2022-11-15 NOTE — Progress Notes (Signed)
Pt's states no medical or surgical changes since previsit or office visit. 

## 2022-11-15 NOTE — Telephone Encounter (Signed)
Pt contacted coumadin clinic to report her colonoscopy went well today. They found diverticulitis and hemorrhoids. No active bleeding.  She reports she was advised by GI surgeon to restart warfarin tonight. Pt denies any bleeding or abnormal bruising. Advised to only take 1/2 tablet (2.5 mg) tonight instead of her normal full tablet (5 mg) on Wednesdays and then resume her prior dosing. Prior dosing was 5 mg daily except on Tuesday, Thursday and Saturday take 2.5 mg. Pt read back instructions and wrote them down. Advised pt of recheck INR apt on 10/25. Advised pt if any s/s of abnormal bruising or bleeding go to the ER or call 911. Pt verbalized understanding.

## 2022-11-15 NOTE — Op Note (Signed)
[facility] Patient Name: [name] Little Procedure Date: 11/15/2022 1:15 PM MRN: [id number] Endoscopist: Iva Boop , MD, [id number] Age: 89 Referring MD:  Date of Birth: 04-06-1933 Gender: Female Account #: [id number] Procedure:                Colonoscopy Indications:              Rectal bleeding Medicines:                Monitored Anesthesia Care Procedure:                Pre-Anesthesia Assessment:                           - Prior to the procedure, a History and Physical                            was performed, and patient medications and                            allergies were reviewed. The patient's tolerance of                            previous anesthesia was also reviewed. The risks                            and benefits of the procedure and the sedation                            options and risks were discussed with the patient.                            All questions were answered, and informed consent                            was obtained. Prior Anticoagulants: The patient                            last took Coumadin (warfarin) 10 days prior to the                            procedure. ASA Grade Assessment: III - A patient                            with severe systemic disease. After reviewing the                            risks and benefits, the patient was deemed in                            satisfactory condition to undergo the procedure.                           After obtaining informed consent, the colonoscope  was passed under direct vision. Throughout the                            procedure, the patient's blood pressure, pulse, and                            oxygen saturations were monitored continuously. The                            CF [id number] #[id number] was introduced through the anus                            and advanced to the the cecum, identified by                            appendiceal orifice and  ileocecal valve. The                            colonoscopy was somewhat difficult due to a                            redundant colon and significant looping. Successful                            completion of the procedure was aided by applying                            abdominal pressure and using an abdominal binder.                            The patient tolerated the procedure well. The                            quality of the bowel preparation was good. The                            ileocecal valve, appendiceal orifice, and rectum                            were photographed. Scope In: 1:33:19 PM Scope Out: 1:52:26 PM Scope Withdrawal Time: 0 hours 7 minutes 20 seconds  Total Procedure Duration: 0 hours 19 minutes 7 seconds  Findings:                 The perianal and digital rectal examinations were                            normal.                           Multiple diverticula were found in the sigmoid                            colon.  External and internal hemorrhoids were found.                           The exam was otherwise without abnormality on                            direct and retroflexion views. Complications:            No immediate complications. Estimated Blood Loss:     Estimated blood loss: none. Impression:               - Diverticulosis in the sigmoid colon.                           - External and internal hemorrhoids.                           - The examination was otherwise normal on direct                            and retroflexion views.                           - No specimens collected. Bleeding was hemorrhoids                            vs diverticulosis. Recommendation:           - Patient has a contact number available for                            emergencies. The signs and symptoms of potential                            delayed complications were discussed with the                            patient. Return to  normal activities tomorrow.                            Written discharge instructions were provided to the                            patient.                           - Resume previous diet.                           - Continue present medications.                           - Resume Coumadin (warfarin) at prior dose today.                            F/U anti-coag clinic for this also. Iva Boop, MD 11/15/2022 2:07:10 PM This report has been signed electronically.

## 2022-11-15 NOTE — Progress Notes (Signed)
Report given to PACU, vss 

## 2022-11-15 NOTE — Patient Instructions (Addendum)
Please read handouts provided. Continue present medications. Resume Coumadin ( warfarin ) at prior dose today. Resume previous diet.   YOU HAD AN ENDOSCOPIC PROCEDURE TODAY AT THE [facility]:   Refer to the procedure report that was given to you for any specific questions about what was found during the examination.  If the procedure report does not answer your questions, please call your gastroenterologist to clarify.  If you requested that your care partner not be given the details of your procedure findings, then the procedure report has been included in a sealed envelope for you to review at your convenience later.  YOU SHOULD EXPECT: Some feelings of bloating in the abdomen. Passage of more gas than usual.  Walking can help get rid of the air that was put into your GI tract during the procedure and reduce the bloating. If you had a lower endoscopy (such as a colonoscopy or flexible sigmoidoscopy) you may notice spotting of blood in your stool or on the toilet paper. If you underwent a bowel prep for your procedure, you may not have a normal bowel movement for a few days.  Please Note:  You might notice some irritation and congestion in your nose or some drainage.  This is from the oxygen used during your procedure.  There is no need for concern and it should clear up in a day or so.  SYMPTOMS TO REPORT IMMEDIATELY:  Following lower endoscopy (colonoscopy or flexible sigmoidoscopy):  Excessive amounts of blood in the stool  Significant tenderness or worsening of abdominal pains  Swelling of the abdomen that is new, acute  Fever of 100F or higher.  For urgent or emergent issues, a gastroenterologist can be reached at any hour by calling [phone]. Do not use MyChart messaging for urgent concerns.    DIET:  We do recommend a small meal at first, but then you may proceed to your regular diet.  Drink plenty of fluids but you should avoid alcoholic beverages for 24  hours.  ACTIVITY:  You should plan to take it easy for the rest of today and you should NOT DRIVE or use heavy machinery until tomorrow (because of the sedation medicines used during the test).    FOLLOW UP: Our staff will call the number listed on your records the next business day following your procedure.  We will call around 7:15- 8:00 am to check on you and address any questions or concerns that you may have regarding the information given to you following your procedure. If we do not reach you, we will leave a message.     If any biopsies were taken you will be contacted by phone or by letter within the next 1-3 weeks.  Please call us at [phone] if you have not heard about the biopsies in 3 weeks.    SIGNATURES/CONFIDENTIALITY: You and/or your care partner have signed paperwork which will be entered into your electronic medical record.  These signatures attest to the fact that that the information above on your After Visit Summary has been reviewed and is understood.  Full responsibility of the confidentiality of this discharge information lies with you and/or your care-partner.Two findings seen that could have caused the bleeding: - diverticulosis and hemorrhoids  No polyps or cancer were seen.  Please restart warfarin and follow-up with the anti-coagulation clinic about that.  I appreciate the opportunity to care for you. Iva Boop, MD, [facility]

## 2022-11-16 ENCOUNTER — Telehealth: Payer: Self-pay

## 2022-11-16 ENCOUNTER — Encounter: Payer: Self-pay | Admitting: Family Medicine

## 2022-11-16 ENCOUNTER — Other Ambulatory Visit: Payer: Self-pay | Admitting: Family Medicine

## 2022-11-16 ENCOUNTER — Ambulatory Visit: Payer: Medicare PPO | Admitting: Family Medicine

## 2022-11-16 ENCOUNTER — Telehealth: Payer: Self-pay | Admitting: *Deleted

## 2022-11-16 ENCOUNTER — Ambulatory Visit (HOSPITAL_BASED_OUTPATIENT_CLINIC_OR_DEPARTMENT_OTHER)
Admission: RE | Admit: 2022-11-16 | Discharge: 2022-11-16 | Disposition: A | Discharge status: Home / Self Care | Payer: Medicare PPO | Source: Ambulatory Visit | Attending: Family Medicine | Admitting: Family Medicine

## 2022-11-16 VITALS — BP 140/80 | HR 88 | Temp 98.0°F | Resp 18 | Ht 62.0 in | Wt 169.0 lb

## 2022-11-16 DIAGNOSIS — M79662 Pain in left lower leg: Secondary | ICD-10-CM

## 2022-11-16 DIAGNOSIS — I809 Phlebitis and thrombophlebitis of unspecified site: Secondary | ICD-10-CM

## 2022-11-16 DIAGNOSIS — I824Y2 Acute embolism and thrombosis of unspecified deep veins of left proximal lower extremity: Secondary | ICD-10-CM | POA: Diagnosis not present

## 2022-11-16 DIAGNOSIS — M79605 Pain in left leg: Secondary | ICD-10-CM | POA: Diagnosis not present

## 2022-11-16 DIAGNOSIS — M7989 Other specified soft tissue disorders: Secondary | ICD-10-CM | POA: Diagnosis not present

## 2022-11-16 NOTE — Progress Notes (Signed)
Established Patient Office Visit  Subjective   Patient ID: [name] Little, female    DOB: [birth year]  Age: 89 y.o. MRN: [id number]  Chief Complaint  Patient presents with   Leg Pain    Left leg pain, pt states having colonoscopy yesterday. Pt states pain started yesterday before colon. Pain is behind left knee    HPI Discussed the use of AI scribe software for clinical note transcription with the patient, who gave verbal consent to proceed.  History of Present Illness   The patient presents with left leg pain, described as 'hard and red  and it hurts.' The pain is located at the bend of the leg and was first noticed during a recent colonoscopy when she was asked to position her knee on her leg. The patient reports that she has experienced a similar issue in the same leg around 1998 or 1999, which was diagnosed as a clot. She does not recall the specific treatment she received at that time.  The patient was recently off Coumadin due to bleeding but restarted half a tablet on Wednesday. She denies any shortness of breath or chest pain.      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   Hematuria 01/29/2018   Hand injury 10/27/2017   Laceration of right hand with infection 10/26/2017   Kidney stone 09/17/2017   Aortic atherosclerosis (HCC) 08/18/2017   Mitral valve regurgitation 09/01/2016   History of UTI 08/22/2016   Lower extremity edema 08/22/2016   BCC (basal cell carcinoma),  scalp/neck 09/27/2015   Pain in limb 05/30/2015   Low back pain 03/26/2014   Type 2 diabetes mellitus with obesity (HCC) 02/26/2014   Nocturia 02/26/2014   Obesity (BMI 30-39.9) 02/03/2014   Anxiety state 09/22/2013   FH: factor V Leiden mutation 08/19/2013   Preventative health care 04/02/2013   Encounter for therapeutic drug monitoring 03/26/2013   Anticoagulant long-term use 08/23/2010   CALLUS, TOE 01/05/2010   CHEST PAIN 08/23/2009   Malignant neoplasm of female breast (HCC) 07/27/2009   PHLEBITIS&THROMBOPHLEB SUP VESSELS LOWER EXTREM 12/11/2007   HEMORRHOIDS 12/09/2007   History of colonic polyps 12/09/2007   Essential hypertension 09/18/2007   Venous (peripheral) insufficiency 07/02/2007   Hyperlipidemia 06/04/2007   Paroxysmal supraventricular tachycardia (HCC) 03/28/2007   Aneurysm of thoracic aorta (HCC) 11/25/2006   DVT, HX OF 08/11/2006   Past Medical History:  Diagnosis Date   Anticoagulated on Coumadin    long-term for factor V---  managed by coumadin clinic and pcp   Diet-controlled type 2 diabetes mellitus (HCC)    followed by pcp   Factor V deficiency (HCC) 1998   anticoagulated on coumadin   GAD (generalized anxiety disorder)    Heart murmur    Hemorrhoids    History of avascular necrosis of capital femoral epiphysis 1999   s/p THA right   History of basal cell carcinoma (BCC) excision    scalp/ neck   History of Clostridium difficile infection    History of DVT of lower extremity 1998;  04/ 2006   LLE   History of external beam radiation therapy 2010   right breast cancer   History of ITP    per pt hx long-term use prednisone for ITP,  1998 s/p total splenctomy   History of kidney stones    History of melanoma 1980s   s/p  right calf melanoma excision , per pt not malignant and localized ,  no recurrence   History of paroxysmal supraventricular tachycardia    (02-23-2020  pt stated have not had any issues / symptoms in several years   History of  pulmonary embolus (PE) 04/2004   History of right breast cancer 2010   08-10-2008 s/p  right lumpectomy w/ sln bx (partial mastectomy) , ER+, Stage I,  completed radiation 2010,  per pt no recurrance   Hypertension    Macular degeneration, bilateral    mild   Mitral valve regurgitation 09/01/2016   cardiology-- dr Eden Emms--- per note mild to moderate without stenosis,     Mixed hyperlipidemia 05/18/2015   Nocturia    OA (osteoarthritis)    lumbar stenosis, radiculopathy, OA- L hip   Personal history of colonic polyps    hyperplastic   Renal calculus, right    Right ureteral calculus    Thoracic ascending aortic aneurysm (HCC)    last CT angio chest in epic 11-29-2016  , 3.7cm   Past Surgical History:  Procedure Laterality Date   ABDOMINAL HYSTERECTOMY  1973   BREAST LUMPECTOMY WITH RADIOACTIVE SEED AND SENTINEL LYMPH NODE BIOPSY Right 08/10/2008   @[facility]    CATARACT EXTRACTION W/ INTRAOCULAR LENS  IMPLANT, BILATERAL Bilateral 2001   CYSTOSCOPY WITH RETROGRADE PYELOGRAM, URETEROSCOPY AND STENT PLACEMENT Right 02/25/2020   Procedure: CYSTOSCOPY WITH RETROGRADE PYELOGRAM, URETEROSCOPY AND STENT EXCHANGE;  Surgeon: Sebastian Ache, MD;  Location: [facility];  Service: Urology;  Laterality: Right;  75 MINS   CYSTOSCOPY WITH STENT PLACEMENT Right 01/27/2020   Procedure: CYSTOSCOPY WITH STENT PLACEMENT;  Surgeon: Sebastian Ache, MD;  Location: [facility];  Service: Urology;  Laterality: Right;   CYSTOSCOPY/URETEROSCOPY/HOLMIUM LASER/STENT PLACEMENT Left 06-27-2018  dr Logan Bores,  @[facility]    HOLMIUM LASER APPLICATION Right 02/25/2020   Procedure: HOLMIUM LASER APPLICATION;  Surgeon: Sebastian Ache, MD;  Location: [facility];  Service: Urology;  Laterality: Right;   I & D EXTREMITY Right 10/26/2017   Procedure: IRRIGATION AND DEBRIDEMENT RIGHT HAND;  Surgeon: Bradly Bienenstock, MD;  Location: [facility] OR;  Service: Orthopedics;  Laterality: Right;   LUMBAR LAMINECTOMY/DECOMPRESSION  MICRODISCECTOMY  04/11/2011   Procedure: LUMBAR LAMINECTOMY/DECOMPRESSION MICRODISCECTOMY;  Surgeon: Barnett Abu, MD;  Location: [facility];  Service: Neurosurgery;  Laterality: N/A;  Lumbar Five-Sacral One Microdiscectomy   MELANOMA EXCISION Right 1980s   right calf   SPLENECTOMY, TOTAL  1998   TOTAL HIP ARTHROPLASTY Right 1999   UPPER GASTROINTESTINAL ENDOSCOPY     Social History   Tobacco Use   Smoking status: Never   Smokeless tobacco: Never  Vaping Use   Vaping status: Never Used  Substance Use Topics   Alcohol use: No    Alcohol/week: 0.0 standard drinks of alcohol   Drug use: Never   Social History   Socioeconomic History   Marital status: Married    Spouse name: Not on file   Number of children: 4   Years of education: Not on file   Highest education level: Not on file  Occupational History   Occupation: Retired  Tobacco Use   Smoking status: Never  Smokeless tobacco: Never  Vaping Use   Vaping status: Never Used  Substance and Sexual Activity   Alcohol use: No    Alcohol/week: 0.0 standard drinks of alcohol   Drug use: Never   Sexual activity: Not on file  Other Topics Concern   Not on file  Social History Narrative   Married 1955   2 sons - '59, '61, 2 daughters '55, '57; 8 grandchildren; 1 great grand   I-ADLs      Social Determinants of Health   Financial Resource Strain: Not on file  Food Insecurity: No Food Insecurity (11/05/2022)   Hunger Vital Sign    Worried About Running Out of Food in the Last Year: Never true    Ran Out of Food in the Last Year: Never true  Transportation Needs: No Transportation Needs (11/05/2022)   PRAPARE - Administrator, Civil Service (Medical): No    Lack of Transportation (Non-Medical): No  Physical Activity: Not on file  Stress: Not on file  Social Connections: Not on file  Intimate Partner Violence: Not At Risk (11/05/2022)   Humiliation, Afraid, Rape, and Kick questionnaire    Fear of Current or  Ex-Partner: No    Emotionally Abused: No    Physically Abused: No    Sexually Abused: No   Family Status  Relation Name Status   Mother  Deceased   Father  Deceased   Sister 22 Deceased   Sister 44 Deceased   Sister 60 Alive   Brother 25 Deceased       BLOOD 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  (Not Specified)   Oceanographer  (Not Specified)   Other  (Not Specified)   Neg Hx  (Not Specified)  No partnership data on file   Family History  Problem Relation Age of Onset   Heart disease Mother    Heart disease Father    Alzheimer's disease Sister    Dementia Sister    Dementia Sister    Stroke Sister    Dementia Sister        mean, carotid artery disease   Clotting disorder Maternal Uncle    Breast cancer Paternal Aunt    Cancer Paternal Aunt        breast   Heart disease Other        uncles   Colon cancer Neg Hx    Anesthesia problems Neg Hx    Hypotension Neg Hx    Malignant hyperthermia Neg Hx    Pseudochol deficiency Neg Hx    Esophageal cancer Neg Hx    Rectal cancer Neg Hx    Stomach cancer Neg Hx    Allergies  Allergen Reactions   Amlodipine Swelling    "my whole body swelled"   Carvedilol Swelling    "my whole body swelled"   Chlorthalidone     Patient reported extreme weakness.   Hydralazine Other (See Comments)    Joint and chest pain   Statins Other (See Comments)    Weakness, pain Lipitor and Pravachol Weakness, pain Lipitor and Pravachol   Zetia [Ezetimibe]     Unable to walk, severe leg pain   Cefdinir     On blood thinnner   Lyrica [Pregabalin] Other (See Comments)    Weakness and joint pain   Cefuroxime Axetil     REACTION: Ddoes not remember reaction   Sulfonamide Derivatives Rash    REACTION: Rash      Review of Systems  Constitutional:  Negative for fever and malaise/fatigue.  HENT:  Negative for congestion.   Eyes:  Negative for blurred vision.  Respiratory:  Negative for shortness of breath.   Cardiovascular:  Positive for leg swelling.  Negative for chest pain, palpitations, orthopnea and PND.  Gastrointestinal:  Negative for abdominal pain, blood in stool and nausea.  Genitourinary:  Negative for dysuria and frequency.  Musculoskeletal:  Negative for falls.  Skin:  Negative for rash.       L calf+ swelling and erythema and pain   Neurological:  Negative for dizziness, loss of consciousness and headaches.  Endo/Heme/Allergies:  Negative for environmental allergies.  Psychiatric/Behavioral:  Negative for depression. The patient is not nervous/anxious.       Objective:     BP (!) 140/80 (BP Location: Left Arm, Patient Position: Sitting, Cuff Size: Normal)   Pulse 88   Temp 98 F (36.7 C) (Oral)   Resp 18   Ht 5\' 2"  (1.575 m)   Wt 169 lb (76.7 kg)   SpO2 96%   BMI 30.91 kg/m  BP Readings from Last 3 Encounters:  11/16/22 (!) 140/80  11/15/22 119/67  11/09/22 128/60   Wt Readings from Last 3 Encounters:  11/16/22 169 lb (76.7 kg)  11/15/22 170 lb (77.1 kg)  11/09/22 170 lb 2 oz (77.2 kg)   SpO2 Readings from Last 3 Encounters:  11/16/22 96%  11/15/22 97%  11/07/22 98%      Physical Exam Vitals and nursing note reviewed.  Constitutional:      General: She is not in acute distress.    Appearance: Normal appearance. She is well-developed.  HENT:     Head: Normocephalic and atraumatic.  Eyes:     General: No scleral icterus.       Right eye: No discharge.        Left eye: No discharge.  Cardiovascular:     Rate and Rhythm: Normal rate and regular rhythm.     Heart sounds: No murmur heard. Pulmonary:     Effort: Pulmonary effort is normal. No respiratory distress.     Breath sounds: Normal breath sounds.  Musculoskeletal:        General: Swelling and tenderness present. Normal range of motion.     Cervical back: Normal range of motion and neck supple.     Right lower leg: No edema.     Left lower leg: No edema.     Comments: L calf--- erythema and tender to touch  Skin:    General: Skin is  warm and dry.     Findings: Erythema present.  Neurological:     Mental Status: She is alert and oriented to person, place, and time.  Psychiatric:        Mood and Affect: Mood normal.        Behavior: Behavior normal.        Thought Content: Thought content normal.        Judgment: Judgment normal.      No results found for any visits on 11/16/22.  Last CBC Lab Results  Component Value Date   WBC 8.1 11/07/2022   HGB 14.4 11/07/2022   HCT 44.9 11/07/2022   MCV 92.4 11/07/2022   MCH 30.4 11/05/2022   RDW 14.8 11/07/2022   PLT 319.0 11/07/2022   Last metabolic panel Lab Results  Component Value Date   GLUCOSE 127 (H) 11/07/2022   NA 139 11/07/2022   K 4.4 11/07/2022   CL 106 11/07/2022  CO2 27 11/07/2022   BUN 36 (H) 11/07/2022   CREATININE 0.96 11/07/2022   GFR 53.43 (L) 11/07/2022   CALCIUM 9.6 11/07/2022   PHOS 3.0 11/19/2013   PROT 6.5 11/07/2022   ALBUMIN 4.0 11/07/2022   BILITOT 0.7 11/07/2022   ALKPHOS 66 11/07/2022   AST 14 11/07/2022   ALT 10 11/07/2022   ANIONGAP 10 11/05/2022   Last lipids Lab Results  Component Value Date   CHOL 154 04/24/2022   HDL 34.40 (L) 04/24/2022   LDLCALC 86 04/24/2022   LDLDIRECT 144.0 02/09/2022   TRIG 167.0 (H) 04/24/2022   CHOLHDL 4 04/24/2022   Last hemoglobin A1c Lab Results  Component Value Date   HGBA1C 6.7 (H) 04/24/2022   Last thyroid functions Lab Results  Component Value Date   TSH 3.57 04/24/2022   Last vitamin D Lab Results  Component Value Date   VD25OH 26.23 (L) 07/14/2021   Last vitamin B12 and Folate Lab Results  Component Value Date   VITAMINB12 622 02/09/2022   FOLATE 15.9 02/04/2008      The ASCVD Risk score (Arnett DK, et al., 2019) failed to calculate for the following reasons:   The 2019 ASCVD risk score is only valid for ages 6 to 21    Assessment & Plan:   Problem List Items Addressed This Visit   None Visit Diagnoses     Pain of left calf    -  Primary   Relevant  Orders   US Venous Img Lower Unilateral Left (DVT) (Completed)     Assessment and Plan    Suspected Deep Vein Thrombosis (DVT) Left calf pain and hardness, history of previous DVT, recently off Coumadin due to bleeding. No shortness of breath or chest pain. -Order urgent lower extremity ultrasound to rule out DVT. -Transport patient to radiology for ultrasound, avoid ambulation.  Anticoagulation management Recently off Coumadin due to bleeding, restarted on half dose (1mg ) on Wednesday. -Consider increasing Coumadin to full dose (2mg ) pending ultrasound results.        Return if symptoms worsen or fail to improve.    Donato Schultz, DO

## 2022-11-16 NOTE — Telephone Encounter (Signed)
  Follow up Call-     11/15/2022   12:47 PM  Call back number  Post procedure Call Back phone  # [phone]  Permission to leave phone message Yes     Patient questions:  Do you have a fever, pain , or abdominal swelling? No. Pain Score  0 *  Have you tolerated food without any problems? Yes.    Have you been able to return to your normal activities? Yes.    Do you have any questions about your discharge instructions: Diet   No. Medications  No. Follow up visit  No.  Do you have questions or concerns about your Care? No.  Actions: * If pain score is 4 or above: No action needed, pain <4.

## 2022-11-16 NOTE — Patient Instructions (Signed)
Phlebitis Phlebitis is soreness and swelling of a vein. This can occur in the arms, legs, or torso, as well as deeper inside the body. Phlebitis is usually not serious when it occurs close to the surface of the body. However, it can cause serious problems when it occurs deeper inside the body. What are the causes? Phlebitis can be caused by: Having a needle, IV, or long, thin tube (catheter) put in the vein. Getting certain medicines or solutions through an IV. Some medicines or solutions, such as antibiotics and cancer medicines, can irritate the vein. Having an IV for a long period of time or in a part of the body that moves a lot. A blood clot. Infection of the vein. Surgery on a vein. What increases the risk? The following factors may make you more likely to develop this condition: Being overweight or obese. Pregnancy. Cancer. Reduced or slowing of blood flow through your veins. This can be caused by: Bed rest over a long period of time. Long-distance travel. Injury. Surgery. Congestive heart failure. Inactivity. Smoking. Taking birth control pills or hormone replacement therapy. Varicose veins. Inflammatory diseases or blood disorders that increase clotting. Taking drugs through the vein. Having a history of blood clots. What are the signs or symptoms? Symptoms of this condition include: A red, tender, swollen, and painful area on your skin. Usually, the area is long and narrow, and it may spread. The affected area feeling warm to the touch. Firmness along the center of the affected area. Low fever. How is this diagnosed? This condition is diagnosed based on: Your symptoms. A physical exam. Your medical history, including your family history. Tests may be done to rule out other conditions, such as blood clots, especially if you have a history of blood clots or are at higher risk of developing blood clots. These may include: Blood tests. Ultrasound exam. Genetic  tests. Biopsy. This is when a tissue sample is removed from the body and looked at under a microscope. This is rare. How is this treated? Treatment depends on the severity of the condition as well as the location of the inflammation. In most cases, the condition is minor and gets better quickly. Treatment may include: Applying a warm, wet cloth (warm compress) or heating pad. Wearing compression stockings or bandages. Medicines, such as: Anti-inflammatory medicines. Antibiotic medicines if an infection is present. Blood-thinning medicines if a blood clot is suspected or present, or if you have a history of blood clots or a blood disorder. Removing an IV that may be causing the problem. Using a different medicine or solution that will not irritate the vein. In rare cases, surgery may be needed to remove a damaged section of a vein. Follow these instructions at home: Managing pain, stiffness, and swelling  If directed, apply heat to the affected area as often as told by your health care provider. Use the heat source that your health care provider recommends, such as a moist heat pack or a heating pad. Place a towel between your skin and the heat source. Leave the heat on for 20-30 minutes. Remove the heat if your skin turns bright red. This is especially important if you are unable to feel pain, heat, or cold. You have a greater risk of getting burned. Raise (elevate) the affected area above the level of your heart while you are sitting or lying down. Medicines Take over-the-counter and prescription medicines only as told by your health care provider. If you were prescribed an antibiotic, take it  as told by your health care provider. Do not stop using the antibiotic even if you start to feel better. If you are taking blood thinners: Talk with your health care provider before you take any medicines that contain aspirin or NSAIDs, such as ibuprofen. These medicines increase your risk for  dangerous bleeding. Take your medicine exactly as told, at the same time every day. Avoid activities that could cause injury or bruising, and follow instructions about how to prevent falls. Wear a medical alert bracelet or carry a card that lists what medicines you take. General instructions If you have phlebitis in your legs: Avoid sitting or lying down for a long time. Keep your legs moving. Try to take short walks to break up long periods of sitting. Try to avoid bed rest that lasts for long periods of time. Regular sleep is not part of bed rest. Wear compression stockings or bandages as told by your health care provider. These stockings reduce swelling in your legs and help to prevent blood clots. They also help to prevent the condition from coming back. Do not use any products that contain nicotine or tobacco. These products include cigarettes, chewing tobacco, and vaping devices, such as e-cigarettes. If you need help quitting, ask your health care provider. Keep all follow-up visits. This is important. This may include any follow-up blood tests. Contact a health care provider if: You have unusual bruising or any bleeding problems. Your symptoms do not get better, or your symptoms get worse. You are on anti-inflammatory medicines and you develop abdominal pain or dark, tarry stools. Get help right away if: You suddenly have chest pain or trouble breathing. You have a fever and your symptoms suddenly get worse. You cough up blood. You feel light-headed or you faint. You have severe pain and swelling in the affected arm or leg. These symptoms may represent a serious problem that is an emergency. Do not wait to see if the symptoms will go away. Get medical help right away. Call your local emergency services (911 in the [place] Do not drive yourself to the hospital. Summary Phlebitis is soreness and swelling of a vein. Phlebitis is usually not serious when it occurs close to the surface of  the body. However, it can cause serious problems when it occurs deeper inside the body. Treatment depends on the severity of the condition and the location of the inflammation. Raise (elevate) the affected area above the level of your heart while you are sitting or lying down. This information is not intended to replace advice given to you by your health care provider. Make sure you discuss any questions you have with your health care provider. Document Revised: 09/15/2019 Document Reviewed: 09/15/2019 Elsevier Patient Education  2024 [employer].

## 2022-11-16 NOTE — Telephone Encounter (Signed)
Pt c/o "varicose vein is killing me behind my left knee". Started yesterday. Reports warm, painful, slight swelling. Pt had a colonoscopy for rectal bleeding assessment yesterday and was advised by surgeon to restart warfarin last night. She contacted coumadin clinic and was advised to start with 1/2 dose last night and then resume her normal dosing. Pt did take the 1/2 dose last night, but has not taken dose of warfarin today yet.  Advised pt she needs apt for assessment and could have a potential DVT. Pt agreed to apt. Apt was made with PCP for 3:20 today. They have [place] capability at the facility if needed. Pt verbalized understanding.  Pt reports she is leaving now for the apt.

## 2022-11-17 ENCOUNTER — Ambulatory Visit: Payer: Medicare PPO

## 2022-11-20 ENCOUNTER — Encounter: Payer: Self-pay | Admitting: Family Medicine

## 2022-11-20 ENCOUNTER — Ambulatory Visit: Payer: Medicare PPO | Admitting: Family Medicine

## 2022-11-20 VITALS — BP 140/90 | HR 71 | Temp 98.2°F | Ht 62.0 in

## 2022-11-20 DIAGNOSIS — Z7901 Long term (current) use of anticoagulants: Secondary | ICD-10-CM

## 2022-11-20 DIAGNOSIS — L03116 Cellulitis of left lower limb: Secondary | ICD-10-CM

## 2022-11-20 LAB — POCT INR: INR: 1.1 — AB (ref 2.0–3.0)

## 2022-11-20 MED ORDER — TRAMADOL HCL 50 MG PO TABS
50.0000 mg | ORAL_TABLET | Freq: Three times a day (TID) | ORAL | 0 refills | Status: DC | PRN
Start: 2022-11-20 — End: 2022-11-23

## 2022-11-20 MED ORDER — CEFTRIAXONE SODIUM 1 G IJ SOLR
1.0000 g | Freq: Once | INTRAMUSCULAR | Status: AC
Start: 2022-11-20 — End: 2022-11-20
  Administered 2022-11-20: 1 g via INTRAMUSCULAR

## 2022-11-20 MED ORDER — DOXYCYCLINE HYCLATE 100 MG PO TABS
100.0000 mg | ORAL_TABLET | Freq: Two times a day (BID) | ORAL | 0 refills | Status: DC
Start: 2022-11-20 — End: 2022-11-27

## 2022-11-20 NOTE — Progress Notes (Signed)
Established Patient Office Visit  Subjective   Patient ID: [name] Little, female    DOB: 03-14-1933  Age: 89 y.o. MRN: [id number]  Chief Complaint  Patient presents with   Leg Pain   Follow-up  Discussed the use of AI scribe software for clinical note transcription with the patient, who gave verbal consent to proceed.  History of Present Illness   The patient, with a history of Factor V Leiden, presents with a leg infection. She reports redness and heat in the affected area, which is localized to the area behind the knee. The patient has been on Coumadin therapy, but the dosage and schedule have been inconsistent due to a recent episode of bleeding. She was taking half a tablet on Tuesdays and Thursdays, and a whole tablet on Saturdays. However, she had to stop the medication on the 4th of the month due to the bleeding episode. The patient also reports pain in the infected area, which she has been managing with Tylenol. She has been prescribed Tramadol for the pain, but has not yet needed to use it.         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   Hematuria 01/29/2018   Hand injury 10/27/2017   Laceration of right hand with infection 10/26/2017   Kidney stone 09/17/2017   Aortic atherosclerosis (HCC) 08/18/2017   Mitral valve regurgitation 09/01/2016   History of UTI 08/22/2016   Lower extremity edema 08/22/2016   BCC (basal cell carcinoma), scalp/neck 09/27/2015   Pain in limb  05/30/2015   Low back pain 03/26/2014   Type 2 diabetes mellitus with obesity (HCC) 02/26/2014   Nocturia 02/26/2014   Obesity (BMI 30-39.9) 02/03/2014   Anxiety state 09/22/2013   FH: factor V Leiden mutation 08/19/2013   Preventative health care 04/02/2013   Encounter for therapeutic drug monitoring 03/26/2013   Anticoagulant long-term use 08/23/2010   CALLUS, TOE 01/05/2010   CHEST PAIN 08/23/2009   Malignant neoplasm of female breast (HCC) 07/27/2009   PHLEBITIS&THROMBOPHLEB SUP VESSELS LOWER EXTREM 12/11/2007   HEMORRHOIDS 12/09/2007   History of colonic polyps 12/09/2007   Essential hypertension 09/18/2007   Venous (peripheral) insufficiency 07/02/2007   Hyperlipidemia 06/04/2007   Paroxysmal supraventricular tachycardia (HCC) 03/28/2007   Aneurysm of thoracic aorta (HCC) 11/25/2006   DVT, HX OF 08/11/2006   Past Medical History:  Diagnosis Date   Anticoagulated on Coumadin    long-term for factor V---  managed by coumadin clinic and pcp   Diet-controlled type 2 diabetes mellitus (HCC)    followed by pcp   Factor V deficiency (HCC) 1998   anticoagulated on coumadin   GAD (generalized anxiety disorder)    Heart murmur    Hemorrhoids    History of avascular necrosis of capital femoral epiphysis 1999   s/p THA right   History of basal cell carcinoma (BCC) excision    scalp/ neck   History of Clostridium difficile infection    History of DVT of lower extremity 1998;  04/ 2006   LLE  History of external beam radiation therapy 2010   right breast cancer   History of ITP    per pt hx long-term use prednisone for ITP,  1998 s/p total splenctomy   History of kidney stones    History of melanoma 1980s   s/p  right calf melanoma excision , per pt not malignant and localized ,  no recurrence   History of paroxysmal supraventricular tachycardia    (02-23-2020  pt stated have not had any issues / symptoms in several years   History of pulmonary embolus (PE) 04/2004    History of right breast cancer 2010   08-10-2008 s/p  right lumpectomy w/ sln bx (partial mastectomy) , ER+, Stage I,  completed radiation 2010,  per pt no recurrance   Hypertension    Macular degeneration, bilateral    mild   Mitral valve regurgitation 09/01/2016   cardiology-- dr Eden Emms--- per note mild to moderate without stenosis,     Mixed hyperlipidemia 05/18/2015   Nocturia    OA (osteoarthritis)    lumbar stenosis, radiculopathy, OA- L hip   Personal history of colonic polyps    hyperplastic   Renal calculus, right    Right ureteral calculus    Thoracic ascending aortic aneurysm (HCC)    last CT angio chest in epic 11-29-2016  , 3.7cm   Past Surgical History:  Procedure Laterality Date   ABDOMINAL HYSTERECTOMY  1973   BREAST LUMPECTOMY WITH RADIOACTIVE SEED AND SENTINEL LYMPH NODE BIOPSY Right 08/10/2008   @[facility]    CATARACT EXTRACTION W/ INTRAOCULAR LENS  IMPLANT, BILATERAL Bilateral 2001   CYSTOSCOPY WITH RETROGRADE PYELOGRAM, URETEROSCOPY AND STENT PLACEMENT Right 02/25/2020   Procedure: CYSTOSCOPY WITH RETROGRADE PYELOGRAM, URETEROSCOPY AND STENT EXCHANGE;  Surgeon: Sebastian Ache, MD;  Location: [facility];  Service: Urology;  Laterality: Right;  75 MINS   CYSTOSCOPY WITH STENT PLACEMENT Right 01/27/2020   Procedure: CYSTOSCOPY WITH STENT PLACEMENT;  Surgeon: Sebastian Ache, MD;  Location: [facility];  Service: Urology;  Laterality: Right;   CYSTOSCOPY/URETEROSCOPY/HOLMIUM LASER/STENT PLACEMENT Left 06-27-2018  dr Logan Bores,  @[facility]    HOLMIUM LASER APPLICATION Right 02/25/2020   Procedure: HOLMIUM LASER APPLICATION;  Surgeon: Sebastian Ache, MD;  Location: [facility];  Service: Urology;  Laterality: Right;   I & D EXTREMITY Right 10/26/2017   Procedure: IRRIGATION AND DEBRIDEMENT RIGHT HAND;  Surgeon: Bradly Bienenstock, MD;  Location: [facility];  Service: Orthopedics;  Laterality: Right;   LUMBAR LAMINECTOMY/DECOMPRESSION MICRODISCECTOMY  04/11/2011    Procedure: LUMBAR LAMINECTOMY/DECOMPRESSION MICRODISCECTOMY;  Surgeon: Barnett Abu, MD;  Location: [facility];  Service: Neurosurgery;  Laterality: N/A;  Lumbar Five-Sacral One Microdiscectomy   MELANOMA EXCISION Right 1980s   right calf   SPLENECTOMY, TOTAL  1998   TOTAL HIP ARTHROPLASTY Right 1999   UPPER GASTROINTESTINAL ENDOSCOPY     Social History   Tobacco Use   Smoking status: Never   Smokeless tobacco: Never  Vaping Use   Vaping status: Never Used  Substance Use Topics   Alcohol use: No    Alcohol/week: 0.0 standard drinks of alcohol   Drug use: Never   Social History   Socioeconomic History   Marital status: Married    Spouse name: Not on file   Number of children: 4   Years of education: Not on file   Highest education level: Not on file  Occupational History   Occupation: Retired  Tobacco Use   Smoking status: Never   Smokeless tobacco: Never  Vaping Use  Vaping status: Never Used  Substance and Sexual Activity   Alcohol use: No    Alcohol/week: 0.0 standard drinks of alcohol   Drug use: Never   Sexual activity: Not on file  Other Topics Concern   Not on file  Social History Narrative   Married 1955   2 sons - '59, '61, 2 daughters '55, '57; 8 grandchildren; 1 great grand   I-ADLs      Social Determinants of Health   Financial Resource Strain: Not on file  Food Insecurity: No Food Insecurity (11/05/2022)   Hunger Vital Sign    Worried About Running Out of Food in the Last Year: Never true    Ran Out of Food in the Last Year: Never true  Transportation Needs: No Transportation Needs (11/05/2022)   PRAPARE - Administrator, Civil Service (Medical): No    Lack of Transportation (Non-Medical): No  Physical Activity: Not on file  Stress: Not on file  Social Connections: Not on file  Intimate Partner Violence: Not At Risk (11/05/2022)   Humiliation, Afraid, Rape, and Kick questionnaire    Fear of Current or Ex-Partner: No    Emotionally  Abused: No    Physically Abused: No    Sexually Abused: No   Family Status  Relation Name Status   Mother  Deceased   Father  Deceased   Sister 24 Deceased   Sister 66 Deceased   Sister 17 Alive   Brother 60 Deceased       BLOOD 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  (Not Specified)   Oceanographer  (Not Specified)   Other  (Not Specified)   Neg Hx  (Not Specified)  No partnership data on file   Family History  Problem Relation Age of Onset   Heart disease Mother    Heart disease Father    Alzheimer's disease Sister    Dementia Sister    Dementia Sister    Stroke Sister    Dementia Sister        mean, carotid artery disease   Clotting disorder Maternal Uncle    Breast cancer Paternal Aunt    Cancer Paternal Aunt        breast   Heart disease Other        uncles   Colon cancer Neg Hx    Anesthesia problems Neg Hx    Hypotension Neg Hx    Malignant hyperthermia Neg Hx    Pseudochol deficiency Neg Hx    Esophageal cancer Neg Hx    Rectal cancer Neg Hx    Stomach cancer Neg Hx    Allergies  Allergen Reactions   Amlodipine Swelling    "my whole body swelled"   Carvedilol Swelling    "my whole body swelled"   Chlorthalidone     Patient reported extreme weakness.   Hydralazine Other (See Comments)    Joint and chest pain   Statins Other (See Comments)    Weakness, pain Lipitor and Pravachol Weakness, pain Lipitor and Pravachol   Zetia [Ezetimibe]     Unable to walk, severe leg pain   Cefdinir     On blood thinnner   Lyrica [Pregabalin] Other (See Comments)    Weakness and joint pain   Cefuroxime Axetil     REACTION: Ddoes not remember reaction   Sulfonamide Derivatives Rash    REACTION: Rash      Review of Systems  Constitutional:  Negative for chills, fever and malaise/fatigue.  HENT:  Negative for congestion and hearing loss.   Eyes:  Negative for blurred vision and discharge.  Respiratory:  Negative for cough, sputum production and shortness of breath.    Cardiovascular:  Negative for chest pain, palpitations and leg swelling.  Gastrointestinal:  Negative for abdominal pain, blood in stool, constipation, diarrhea, heartburn, nausea and vomiting.  Genitourinary:  Negative for dysuria, frequency, hematuria and urgency.  Musculoskeletal:  Negative for back pain, falls and myalgias.  Skin:  Negative for rash.       L low leg---  + erythema behind knee to low thigh, tender and ot to touch  Neurological:  Negative for dizziness, sensory change, loss of consciousness, weakness and headaches.  Endo/Heme/Allergies:  Negative for environmental allergies. Does not bruise/bleed easily.  Psychiatric/Behavioral:  Negative for depression and suicidal ideas. The patient is not nervous/anxious and does not have insomnia.       Objective:     BP (!) 140/90 (BP Location: Left Arm, Patient Position: Sitting, Cuff Size: Normal)   Pulse 71   Temp 98.2 F (36.8 C) (Oral)   Ht 5\' 2"  (1.575 m)   SpO2 93%   BMI 30.91 kg/m  BP Readings from Last 3 Encounters:  11/20/22 (!) 140/90  11/16/22 (!) 140/80  11/15/22 119/67   Wt Readings from Last 3 Encounters:  11/16/22 169 lb (76.7 kg)  11/15/22 170 lb (77.1 kg)  11/09/22 170 lb 2 oz (77.2 kg)   SpO2 Readings from Last 3 Encounters:  11/20/22 93%  11/16/22 96%  11/15/22 97%      Physical Exam Vitals and nursing note reviewed.  Constitutional:      General: She is not in acute distress.    Appearance: Normal appearance. She is well-developed.  HENT:     Head: Normocephalic and atraumatic.  Eyes:     General: No scleral icterus.       Right eye: No discharge.        Left eye: No discharge.  Cardiovascular:     Rate and Rhythm: Normal rate and regular rhythm.     Heart sounds: No murmur heard. Pulmonary:     Effort: Pulmonary effort is normal. No respiratory distress.     Breath sounds: Normal breath sounds.  Musculoskeletal:        General: Normal range of motion.     Cervical back: Normal  range of motion and neck supple.     Right lower leg: No edema.     Left lower leg: No edema.  Skin:    General: Skin is warm and dry.     Findings: Erythema present.          Comments: Area behind L knee--- hot and red , +tender   Neurological:     Mental Status: She is alert and oriented to person, place, and time.  Psychiatric:        Mood and Affect: Mood normal.        Behavior: Behavior normal.        Thought Content: Thought content normal.        Judgment: Judgment normal.     Results for orders placed or performed in visit on 11/20/22  POCT INR  Result Value Ref Range   INR 1.1 (A) 2.0 - 3.0   POC INR      Last CBC Lab Results  Component Value Date   WBC 8.1 11/07/2022   HGB 14.4 11/07/2022   HCT 44.9 11/07/2022   MCV  92.4 11/07/2022   MCH 30.4 11/05/2022   RDW 14.8 11/07/2022   PLT 319.0 11/07/2022   Last metabolic panel Lab Results  Component Value Date   GLUCOSE 127 (H) 11/07/2022   NA 139 11/07/2022   K 4.4 11/07/2022   CL 106 11/07/2022   CO2 27 11/07/2022   BUN 36 (H) 11/07/2022   CREATININE 0.96 11/07/2022   GFR 53.43 (L) 11/07/2022   CALCIUM 9.6 11/07/2022   PHOS 3.0 11/19/2013   PROT 6.5 11/07/2022   ALBUMIN 4.0 11/07/2022   BILITOT 0.7 11/07/2022   ALKPHOS 66 11/07/2022   AST 14 11/07/2022   ALT 10 11/07/2022   ANIONGAP 10 11/05/2022   Last lipids Lab Results  Component Value Date   CHOL 154 04/24/2022   HDL 34.40 (L) 04/24/2022   LDLCALC 86 04/24/2022   LDLDIRECT 144.0 02/09/2022   TRIG 167.0 (H) 04/24/2022   CHOLHDL 4 04/24/2022   Last hemoglobin A1c Lab Results  Component Value Date   HGBA1C 6.7 (H) 04/24/2022   Last thyroid functions Lab Results  Component Value Date   TSH 3.57 04/24/2022   Last vitamin D Lab Results  Component Value Date   VD25OH 26.23 (L) 07/14/2021   Last vitamin B12 and Folate Lab Results  Component Value Date   VITAMINB12 622 02/09/2022   FOLATE 15.9 02/04/2008      The ASCVD Risk  score (Arnett DK, et al., 2019) failed to calculate for the following reasons:   The 2019 ASCVD risk score is only valid for ages 53 to 77    Assessment & Plan:   Problem List Items Addressed This Visit   None Visit Diagnoses     Cellulitis of left lower extremity    -  Primary   Relevant Medications   doxycycline (VIBRA-TABS) 100 MG tablet   traMADol (ULTRAM) 50 MG tablet   cefTRIAXone (ROCEPHIN) injection 1 g (Completed)   On continuous oral anticoagulation       Relevant Orders   POCT INR (Completed)     Assessment and Plan    Superficial Venous Thrombosis with Infection Extensive clot from knee to lower leg with associated infection. Pain controlled with Tylenol. -Administer IM antibiotic today. -Start oral antibiotics. -Check leg on 11/23/2022 to assess for improvement in redness.  Anticoagulation for Factor V Leiden Subtherapeutic INR (1.1) on current regimen of Coumadin 5mg  on MWF and 2.5mg  on TThSatSun. -Increase Coumadin to 10mg  today and tomorrow, then resume regular schedule. -Check INR on 11/23/2022. -Referred to Hematology for further management.  Pain Management Pain controlled with Tylenol. Tramadol prescribed as needed. -Continue Tylenol as needed. -Use Tramadol if Tylenol is insufficient.        Return in about 3 days (around 11/23/2022), or re check cellulitis and INR.    Donato Schultz, DO

## 2022-11-20 NOTE — Telephone Encounter (Signed)
Pt was called and scheduled for 2p same day (10.21.24) to address issue

## 2022-11-20 NOTE — Patient Instructions (Signed)
Coumadin--- take 5mg  2 pills today and tomorrow -- then go back to your regular schedule     Cellulitis, Adult  Cellulitis is a skin infection. The infected area is usually warm, red, swollen, and tender. It most commonly occurs on the lower body, such as the legs, feet, and toes, but this condition can occur on any part of the body. The infection can travel to the muscles, blood, and underlying tissue and become life-threatening without treatment. It is important to get medical treatment right away for this condition. What are the causes? Cellulitis is caused by bacteria. The bacteria enter through a break in the skin, such as a cut, burn, insect or animal bite, open sore, or crack. What increases the risk? This condition is more likely to occur in people who: Have a weak body's defense system (immune system). Are older than 87 years old. Have diabetes. Have a type of long-term (chronic) liver disease (cirrhosis) or kidney disease. Are obese. Have a skin condition such as: An itchy rash, such as eczema or psoriasis. A fungal rash on the feet or in skinfolds. Blistering rashes, such as shingles or chickenpox. Slow movement of blood in the veins (venous stasis). Fluid buildup below the skin (edema). Have open wounds on the skin, such as cuts, puncture wounds, burns, bites, scrapes, tattoos, piercings, or wounds from surgery. Have had radiation therapy. Use IV drugs. What are the signs or symptoms? Symptoms of this condition include: Skin that looks red, purple, or slightly darker than your usual skin color. Streaks or spots on the skin. Swollen area of the skin. Tenderness or pain when an area of the skin is touched. Warm skin. Fever or chills. Blisters. Tiredness (fatigue). How is this diagnosed? This condition is diagnosed based on a medical history and physical exam. You may also have tests, including: Blood tests. Imaging tests. Tests on a sample of fluid taken from the  wound (wound culture). How is this treated? Treatment for this condition may include: Medicines. These may include antibiotics or medicines to treat allergies (antihistamines). Rest. Applying cold or warm wet cloths (compresses) to the skin. If the condition is severe, you may need to stay in the hospital and get antibiotics through an IV. The infection usually starts to get better within 1-2 days of treatment. Follow these instructions at home: Medicines Take over-the-counter and prescription medicines only as told by your health care provider. If you were prescribed antibiotics, take them as told by your provider. Do not stop using the antibiotic even if you start to feel better. General instructions Drink enough fluid to keep your pee (urine) pale yellow. Do not touch or rub the infected area. Raise (elevate) the infected area above the level of your heart while you are sitting or lying down. Return to your normal activities as told by your provider. Ask your provider what activities are safe for you. Apply warm or cold compresses to the affected area as told by your provider. Keep all follow-up visits. Your provider will need to make sure that a more serious infection is not developing. Contact a health care provider if: You have a fever. Your symptoms do not improve within 1-2 days of starting treatment or you develop new symptoms. Your bone or joint underneath the infected area becomes painful after the skin has healed. Your infection returns in the same area or another area. Signs of this may include: You notice a swollen bump in the infected area. Your red area gets larger, turns  dark in color, or becomes more painful. Drainage increases. Pus or a bad smell develops in your infected area. You have more pain. You feel ill and have muscle aches and weakness. You develop vomiting or diarrhea that will not go away. Get help right away if: You notice red streaks coming from the  infected area. You notice the skin turns purple or black and falls off. This symptom may be an emergency. Get help right away. Call 911. Do not wait to see if the symptom will go away. Do not drive yourself to the hospital. This information is not intended to replace advice given to you by your health care provider. Make sure you discuss any questions you have with your health care provider. Document Revised: 09/13/2021 Document Reviewed: 09/13/2021 Elsevier Patient Education  2024 [employer].

## 2022-11-20 NOTE — Telephone Encounter (Signed)
Pt LVM. Returned call and pt said she has had leg pain all weekend. She was contacted by PCP office and made an apt with PCP for today at 2:00. Advised if anything worsened to contact the office. Pt verbalized understanding.

## 2022-11-23 ENCOUNTER — Encounter: Payer: Self-pay | Admitting: Family Medicine

## 2022-11-23 ENCOUNTER — Ambulatory Visit: Payer: Medicare PPO | Admitting: Family Medicine

## 2022-11-23 ENCOUNTER — Other Ambulatory Visit: Payer: Self-pay | Admitting: Family Medicine

## 2022-11-23 VITALS — BP 122/70 | HR 87 | Temp 98.6°F | Resp 20 | Ht 62.0 in | Wt 169.0 lb

## 2022-11-23 DIAGNOSIS — L03116 Cellulitis of left lower limb: Secondary | ICD-10-CM | POA: Diagnosis not present

## 2022-11-23 DIAGNOSIS — I8002 Phlebitis and thrombophlebitis of superficial vessels of left lower extremity: Secondary | ICD-10-CM

## 2022-11-23 DIAGNOSIS — Z7901 Long term (current) use of anticoagulants: Secondary | ICD-10-CM

## 2022-11-23 DIAGNOSIS — Z832 Family history of diseases of the blood and blood-forming organs and certain disorders involving the immune mechanism: Secondary | ICD-10-CM | POA: Diagnosis not present

## 2022-11-23 DIAGNOSIS — I8392 Asymptomatic varicose veins of left lower extremity: Secondary | ICD-10-CM

## 2022-11-23 LAB — POCT INR: INR: 1.9 — AB (ref 2.0–3.0)

## 2022-11-23 MED ORDER — CLINDAMYCIN HCL 300 MG PO CAPS
300.0000 mg | ORAL_CAPSULE | Freq: Four times a day (QID) | ORAL | 0 refills | Status: DC
Start: 2022-11-23 — End: 2022-11-27

## 2022-11-23 MED ORDER — CEFTRIAXONE SODIUM 1 G IJ SOLR
1.0000 g | Freq: Once | INTRAMUSCULAR | Status: AC
Start: 2022-11-23 — End: 2022-11-23
  Administered 2022-11-23: 1 g via INTRAMUSCULAR

## 2022-11-23 MED ORDER — DICLOXACILLIN SODIUM 250 MG PO CAPS
250.0000 mg | ORAL_CAPSULE | Freq: Four times a day (QID) | ORAL | 0 refills | Status: DC
Start: 2022-11-23 — End: 2022-12-26

## 2022-11-23 NOTE — Assessment & Plan Note (Signed)
Change the antibiotic to clindamycin F/u next week

## 2022-11-23 NOTE — Assessment & Plan Note (Signed)
Inr 1.9----  take coumadin 10 mg today and tomorrow f/u next week

## 2022-11-23 NOTE — Progress Notes (Signed)
Established Patient Office Visit  Subjective   Patient ID: [name] Little, female    DOB: 1934-01-23  Age: 89 y.o. MRN: [id number]  Chief Complaint  Patient presents with   Cellulitis    Pt states pain is not better and states feeling pain in other leg now.    Follow-up    HPI Discussed the use of AI scribe software for clinical note transcription with the patient, who gave verbal consent to proceed.  History of Present Illness   The patient presents with worsening leg redness and pain, despite being on doxycycline. The redness is now spreading up the leg. She also reports a new pain in the other leg. The patient has a history of a leaky heart valve and is on Coumadin. She is due to see a hematologist in December. She also reports that the tramadol prescribed for pain is not more effective than Tylenol.      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   Hematuria 01/29/2018   Hand injury 10/27/2017   Laceration of right hand with infection 10/26/2017   Kidney stone 09/17/2017   Aortic atherosclerosis (HCC) 08/18/2017   Mitral valve regurgitation 09/01/2016   History of UTI 08/22/2016   Lower extremity edema 08/22/2016   BCC (basal cell carcinoma), scalp/neck 09/27/2015   Pain in limb 05/30/2015   Low back pain 03/26/2014   Type 2 diabetes mellitus with obesity (HCC) 02/26/2014   Nocturia  02/26/2014   Obesity (BMI 30-39.9) 02/03/2014   Anxiety state 09/22/2013   FH: factor V Leiden mutation 08/19/2013   Preventative health care 04/02/2013   Encounter for therapeutic drug monitoring 03/26/2013   Anticoagulant long-term use 08/23/2010   CALLUS, TOE 01/05/2010   CHEST PAIN 08/23/2009   Malignant neoplasm of female breast (HCC) 07/27/2009   Thrombophlebitis of superficial veins of left lower extremity 12/11/2007   HEMORRHOIDS 12/09/2007   History of colonic polyps 12/09/2007   Essential hypertension 09/18/2007   Venous (peripheral) insufficiency 07/02/2007   Hyperlipidemia 06/04/2007   Paroxysmal supraventricular tachycardia (HCC) 03/28/2007   Aneurysm of thoracic aorta (HCC) 11/25/2006   DVT, HX OF 08/11/2006   Past Medical History:  Diagnosis Date   Anticoagulated on Coumadin    long-term for factor V---  managed by coumadin clinic and pcp   Diet-controlled type 2 diabetes mellitus (HCC)    followed by pcp   Factor V deficiency (HCC) 1998   anticoagulated on coumadin   GAD (generalized anxiety disorder)    Heart murmur    Hemorrhoids    History of avascular necrosis of capital femoral epiphysis 1999   s/p THA right   History of basal cell carcinoma (BCC) excision    scalp/ neck   History of Clostridium difficile infection    History of DVT of lower extremity 1998;  04/ 2006   LLE   History of external beam radiation therapy 2010   right breast cancer  History of ITP    per pt hx long-term use prednisone for ITP,  1998 s/p total splenctomy   History of kidney stones    History of melanoma 1980s   s/p  right calf melanoma excision , per pt not malignant and localized ,  no recurrence   History of paroxysmal supraventricular tachycardia    (02-23-2020  pt stated have not had any issues / symptoms in several years   History of pulmonary embolus (PE) 04/2004   History of right breast cancer 2010   08-10-2008 s/p  right lumpectomy w/ sln bx (partial  mastectomy) , ER+, Stage I,  completed radiation 2010,  per pt no recurrance   Hypertension    Macular degeneration, bilateral    mild   Mitral valve regurgitation 09/01/2016   cardiology-- dr Eden Emms--- per note mild to moderate without stenosis,     Mixed hyperlipidemia 05/18/2015   Nocturia    OA (osteoarthritis)    lumbar stenosis, radiculopathy, OA- L hip   Personal history of colonic polyps    hyperplastic   Renal calculus, right    Right ureteral calculus    Thoracic ascending aortic aneurysm (HCC)    last CT angio chest in epic 11-29-2016  , 3.7cm   Past Surgical History:  Procedure Laterality Date   ABDOMINAL HYSTERECTOMY  1973   BREAST LUMPECTOMY WITH RADIOACTIVE SEED AND SENTINEL LYMPH NODE BIOPSY Right 08/10/2008   @[facility]    CATARACT EXTRACTION W/ INTRAOCULAR LENS  IMPLANT, BILATERAL Bilateral 2001   CYSTOSCOPY WITH RETROGRADE PYELOGRAM, URETEROSCOPY AND STENT PLACEMENT Right 02/25/2020   Procedure: CYSTOSCOPY WITH RETROGRADE PYELOGRAM, URETEROSCOPY AND STENT EXCHANGE;  Surgeon: Sebastian Ache, MD;  Location: [facility];  Service: Urology;  Laterality: Right;  75 MINS   CYSTOSCOPY WITH STENT PLACEMENT Right 01/27/2020   Procedure: CYSTOSCOPY WITH STENT PLACEMENT;  Surgeon: Sebastian Ache, MD;  Location: [facility];  Service: Urology;  Laterality: Right;   CYSTOSCOPY/URETEROSCOPY/HOLMIUM LASER/STENT PLACEMENT Left 06-27-2018  dr Logan Bores,  @[facility]    HOLMIUM LASER APPLICATION Right 02/25/2020   Procedure: HOLMIUM LASER APPLICATION;  Surgeon: Sebastian Ache, MD;  Location: [facility];  Service: Urology;  Laterality: Right;   I & D EXTREMITY Right 10/26/2017   Procedure: IRRIGATION AND DEBRIDEMENT RIGHT HAND;  Surgeon: Bradly Bienenstock, MD;  Location[facility];  Service: Orthopedics;  Laterality: Right;   LUMBAR LAMINECTOMY/DECOMPRESSION MICRODISCECTOMY  04/11/2011   Procedure: LUMBAR LAMINECTOMY/DECOMPRESSION MICRODISCECTOMY;  Surgeon: Barnett Abu, MD;   Location: [facility];  Service: Neurosurgery;  Laterality: N/A;  Lumbar Five-Sacral One Microdiscectomy   MELANOMA EXCISION Right 1980s   right calf   SPLENECTOMY, TOTAL  1998   TOTAL HIP ARTHROPLASTY Right 1999   UPPER GASTROINTESTINAL ENDOSCOPY     Social History   Tobacco Use   Smoking status: Never   Smokeless tobacco: Never  Vaping Use   Vaping status: Never Used  Substance Use Topics   Alcohol use: No    Alcohol/week: 0.0 standard drinks of alcohol   Drug use: Never   Social History   Socioeconomic History   Marital status: Married    Spouse name: Not on file   Number of children: 4   Years of education: Not on file   Highest education level: Not on file  Occupational History   Occupation: Retired  Tobacco Use   Smoking status: Never   Smokeless tobacco: Never  Vaping Use   Vaping status: Never Used  Substance and Sexual Activity   Alcohol  use: No    Alcohol/week: 0.0 standard drinks of alcohol   Drug use: Never   Sexual activity: Not on file  Other Topics Concern   Not on file  Social History Narrative   Married 1955   2 sons - '59, '61, 2 daughters '55, '57; 8 grandchildren; 1 great grand   I-ADLs      Social Determinants of Health   Financial Resource Strain: Not on file  Food Insecurity: No Food Insecurity (11/05/2022)   Hunger Vital Sign    Worried About Running Out of Food in the Last Year: Never true    Ran Out of Food in the Last Year: Never true  Transportation Needs: No Transportation Needs (11/05/2022)   PRAPARE - Administrator, Civil Service (Medical): No    Lack of Transportation (Non-Medical): No  Physical Activity: Not on file  Stress: Not on file  Social Connections: Not on file  Intimate Partner Violence: Not At Risk (11/05/2022)   Humiliation, Afraid, Rape, and Kick questionnaire    Fear of Current or Ex-Partner: No    Emotionally Abused: No    Physically Abused: No    Sexually Abused: No   Family Status  Relation  Name Status   Mother  Deceased   Father  Deceased   Sister 104 Deceased   Sister 18 Deceased   Sister 62 Alive   Brother 13 Deceased       BLOOD 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  (Not Specified)   Oceanographer  (Not Specified)   Other  (Not Specified)   Neg Hx  (Not Specified)  No partnership data on file   Family History  Problem Relation Age of Onset   Heart disease Mother    Heart disease Father    Alzheimer's disease Sister    Dementia Sister    Dementia Sister    Stroke Sister    Dementia Sister        mean, carotid artery disease   Clotting disorder Maternal Uncle    Breast cancer Paternal Aunt    Cancer Paternal Aunt        breast   Heart disease Other        uncles   Colon cancer Neg Hx    Anesthesia problems Neg Hx    Hypotension Neg Hx    Malignant hyperthermia Neg Hx    Pseudochol deficiency Neg Hx    Esophageal cancer Neg Hx    Rectal cancer Neg Hx    Stomach cancer Neg Hx    Allergies  Allergen Reactions   Amlodipine Swelling    "my whole body swelled"   Carvedilol Swelling    "my whole body swelled"   Chlorthalidone     Patient reported extreme weakness.   Hydralazine Other (See Comments)    Joint and chest pain   Statins Other (See Comments)    Weakness, pain Lipitor and Pravachol Weakness, pain Lipitor and Pravachol   Zetia [Ezetimibe]     Unable to walk, severe leg pain   Cefdinir     On blood thinnner   Lyrica [Pregabalin] Other (See Comments)    Weakness and joint pain   Cefuroxime Axetil     REACTION: Ddoes not remember reaction   Sulfonamide Derivatives Rash    REACTION: Rash      Review of Systems  Constitutional:  Negative for chills, fever and malaise/fatigue.  HENT:  Negative for congestion and hearing loss.   Eyes:  Negative for blurred vision and discharge.  Respiratory:  Negative for cough, sputum production and shortness of breath.   Cardiovascular:  Negative for chest pain, palpitations and leg swelling.  Gastrointestinal:   Negative for abdominal pain, blood in stool, constipation, diarrhea, heartburn, nausea and vomiting.  Genitourinary:  Negative for dysuria, frequency, hematuria and urgency.  Musculoskeletal:  Negative for back pain, falls and myalgias.  Skin:  Negative for rash.  Neurological:  Negative for dizziness, sensory change, loss of consciousness, weakness and headaches.  Endo/Heme/Allergies:  Negative for environmental allergies. Does not bruise/bleed easily.  Psychiatric/Behavioral:  Negative for depression and suicidal ideas. The patient is not nervous/anxious and does not have insomnia.       Objective:     BP 122/70 (BP Location: Left Arm, Patient Position: Sitting, Cuff Size: Large)   Pulse 87   Temp 98.6 F (37 C) (Oral)   Resp 20   Ht 5\' 2"  (1.575 m)   Wt 169 lb (76.7 kg)   SpO2 93%   BMI 30.91 kg/m  BP Readings from Last 3 Encounters:  11/23/22 122/70  11/20/22 (!) 140/90  11/16/22 (!) 140/80   Wt Readings from Last 3 Encounters:  11/23/22 169 lb (76.7 kg)  11/16/22 169 lb (76.7 kg)  11/15/22 170 lb (77.1 kg)   SpO2 Readings from Last 3 Encounters:  11/23/22 93%  11/20/22 93%  11/16/22 96%      Physical Exam Vitals and nursing note reviewed.  Constitutional:      General: She is not in acute distress.    Appearance: Normal appearance. She is well-developed.  HENT:     Head: Normocephalic and atraumatic.  Eyes:     General: No scleral icterus.       Right eye: No discharge.        Left eye: No discharge.  Cardiovascular:     Rate and Rhythm: Normal rate and regular rhythm.     Heart sounds: No murmur heard. Pulmonary:     Effort: Pulmonary effort is normal. No respiratory distress.     Breath sounds: Normal breath sounds.  Musculoskeletal:        General: Tenderness present. Normal range of motion.     Cervical back: Normal range of motion and neck supple.     Right lower leg: No edema.     Left lower leg: No edema.       Legs:     Comments: Erythema  has improved but pin has moved up the leg to mid thigh  Skin:    General: Skin is warm and dry.     Findings: Erythema present.  Neurological:     General: No focal deficit present.     Mental Status: She is alert and oriented to person, place, and time.  Psychiatric:        Mood and Affect: Mood normal.        Behavior: Behavior normal.        Thought Content: Thought content normal.        Judgment: Judgment normal.      Results for orders placed or performed in visit on 11/23/22  POCT INR  Result Value Ref Range   INR 1.9 (A) 2.0 - 3.0   POC INR      Last CBC Lab Results  Component Value Date   WBC 8.1 11/07/2022   HGB 14.4 11/07/2022   HCT 44.9 11/07/2022   MCV 92.4 11/07/2022   MCH 30.4 11/05/2022  RDW 14.8 11/07/2022   PLT 319.0 11/07/2022   Last metabolic panel Lab Results  Component Value Date   GLUCOSE 127 (H) 11/07/2022   NA 139 11/07/2022   K 4.4 11/07/2022   CL 106 11/07/2022   CO2 27 11/07/2022   BUN 36 (H) 11/07/2022   CREATININE 0.96 11/07/2022   GFR 53.43 (L) 11/07/2022   CALCIUM 9.6 11/07/2022   PHOS 3.0 11/19/2013   PROT 6.5 11/07/2022   ALBUMIN 4.0 11/07/2022   BILITOT 0.7 11/07/2022   ALKPHOS 66 11/07/2022   AST 14 11/07/2022   ALT 10 11/07/2022   ANIONGAP 10 11/05/2022   Last lipids Lab Results  Component Value Date   CHOL 154 04/24/2022   HDL 34.40 (L) 04/24/2022   LDLCALC 86 04/24/2022   LDLDIRECT 144.0 02/09/2022   TRIG 167.0 (H) 04/24/2022   CHOLHDL 4 04/24/2022   Last hemoglobin A1c Lab Results  Component Value Date   HGBA1C 6.7 (H) 04/24/2022   Last thyroid functions Lab Results  Component Value Date   TSH 3.57 04/24/2022   Last vitamin D Lab Results  Component Value Date   VD25OH 26.23 (L) 07/14/2021   Last vitamin B12 and Folate Lab Results  Component Value Date   VITAMINB12 622 02/09/2022   FOLATE 15.9 02/04/2008   Lab Results  Component Value Date   INR 1.9 (A) 11/23/2022   INR 1.1 (A) 11/20/2022    INR 1.2 (H) 11/07/2022   PROTIME 23.1 07/14/2008     The ASCVD Risk score (Arnett DK, et al., 2019) failed to calculate for the following reasons:   The 2019 ASCVD risk score is only valid for ages 55 to 102    Assessment & Plan:   Problem List Items Addressed This Visit       Unprioritized   Varicose veins of left lower leg    Refer to vascular       Relevant Orders   Ambulatory referral to Vascular Surgery   Thrombophlebitis of superficial veins of left lower extremity - Primary    Inr 1.9----  take coumadin 10 mg today and tomorrow f/u next week       Relevant Orders   Ambulatory referral to Vascular Surgery   POCT INR (Completed)   FH: factor V Leiden mutation    Hematology pending       Cellulitis of left lower extremity    Change the antibiotic to clindamycin F/u next week       Relevant Medications   clindamycin (CLEOCIN) 300 MG capsule   Other Visit Diagnoses     On continuous oral anticoagulation       Relevant Orders   POCT INR (Completed)     Assessment and Plan    Cellulitis Persistent redness and warmth in the leg despite 3 days of Doxycycline. No improvement in symptoms. -Discontinue Doxycycline. -Administer 1g of Rosadan IM today. -Start new oral antibiotic (name not specified in conversation). -If no improvement or worsening, patient to go to the emergency room for IV antibiotics. -Attempt to expedite appointment with vascular specialist. -Follow-up in 1 week.  Anticoagulation for Atrial Fibrillation INR 1.9, subtherapeutic. Patient reports inconsistent dosing. -Continue Coumadin with 2 days of 10mg , then resume regular dosing. -Check INR at next visit.  Pain Management Tramadol not providing adequate pain relief, patient prefers to continue with Tylenol. -Continue Tylenol as needed for pain.        No follow-ups on file.    Donato Schultz, DO

## 2022-11-23 NOTE — Assessment & Plan Note (Signed)
Hematology pending

## 2022-11-23 NOTE — Assessment & Plan Note (Signed)
Refer to vascular 

## 2022-11-23 NOTE — Telephone Encounter (Signed)
Contacted pt to advise she will not need coumadin clinic apt tomorrow due to PCP checking INR today at her OV. She will have INR rechecked at OV with PCP on 10/28. Pt reported she is frustrated and wants to try a different course of treatment. A family member recommended hirudoid cream, with an active ingredient of heparinoid. She also reports she was prescribed a different abx, clindamycin, and she took one and it stuck in her chest and she is not taking anymore. Advised pt a msg would be sent to PCP to inquire about the cream and abx.   PCP advised against cream due to heparin active ingredient and could interact with warfarin. PCP advised if pt does not take the abx she would recommend pt go to the ER.   Pt reports she will not use the cream due to the interaction. Advised pt she should continue the clindamycin for the cellulitis, which is causing pain also. Advised pt the capsules could be opened and contents added to a small amount of water or juice, so she would not have to swallow the capsule. Advised to take with food and not on an empty stomach because it can cause GI upset. Advised if she had any facial swelling, throat swelling or tongue swelling or difficulty breathing to call 911. Advised if any other side effects to contact clinic.   Pt verbalized understanding and was appreciative of the call. Will f/u with pt by phone tomorrow.

## 2022-11-24 ENCOUNTER — Ambulatory Visit: Payer: Medicare PPO

## 2022-11-24 ENCOUNTER — Telehealth: Payer: Self-pay | Admitting: Family Medicine

## 2022-11-24 NOTE — Telephone Encounter (Signed)
Spoke with patient. Pt states she is unable to take the old antibiotic or the new antibiotic because causes her to vomit. Pt states she did not pick up the new rx. Pt was advised to go the ER but states she is "worn out" from the day and will go in the morning. I advised patient if sxs worsen to call EMS. Pt verbalized understanding. Pt states she will go to the ED downstairs in the morning. I also advised patient that her daughter is not on her DPR and we were unable to speak to her.

## 2022-11-24 NOTE — Telephone Encounter (Signed)
[name] Little (daughter DPR NOT Ok) called stating that she wants to have something else done with her mother regarding her issues on Thrombophlebitis. Pt has already had two injections regard this and has had issues with the oral antibiotics prescribed to her (one of which made her vomit consistently). [name] was trying to see if there was another course of treatment that can be done for this as she is concerned that her mother may end up C. Diff and be back in the hospital. Please Advise.

## 2022-11-24 NOTE — Telephone Encounter (Signed)
Noted  

## 2022-11-24 NOTE — Telephone Encounter (Signed)
Per Dr. Laury Axon,  [place]... thank you... I did send in a different t Abx but if she takes what she has that's best

## 2022-11-24 NOTE — Telephone Encounter (Signed)
Contacted pt to inquire how she is doing. Pt reports pain in leg has improved slightly but is still very sore. She reports taking one dose of clindamycin and then immediately throwing up last night. She took abx on an empty stomach. She believes she even threw up her warfarin. She reports feeling much better after throwing up.  Advised pt a new abx, dicloxacillin, was sent in by PCP last night. Advised this abx will interact with warfarin and could lower INR but her INR will be checked on 10/28 and dosing changes can be made if needed. Advised if she starts the new abx to stop all other abx. Advised to eat before taking abx to reduce GI upset. Advised to ask pharmacist if it is ok to open the capsules of the new abx if she has any difficulty swallowing the capsule. Advised pt to continue warfarin dosing as directed by PCP. Advised if any s/s of abnormal bruising or bleeding, s/s of a clot, or any changes to contact the office or go to the ER. Pt verbalized understanding and was appreciative of the call.

## 2022-11-27 ENCOUNTER — Encounter: Payer: Self-pay | Admitting: Family Medicine

## 2022-11-27 ENCOUNTER — Ambulatory Visit: Payer: Medicare PPO | Admitting: Family Medicine

## 2022-11-27 VITALS — BP 160/90 | HR 74 | Temp 97.8°F | Resp 18 | Ht 62.0 in | Wt 169.0 lb

## 2022-11-27 DIAGNOSIS — Z7901 Long term (current) use of anticoagulants: Secondary | ICD-10-CM

## 2022-11-27 DIAGNOSIS — I8002 Phlebitis and thrombophlebitis of superficial vessels of left lower extremity: Secondary | ICD-10-CM | POA: Diagnosis not present

## 2022-11-27 LAB — POCT INR
INR: 4.7 — AB (ref 2.0–3.0)
INR: 4.9 — AB (ref 2.0–3.0)

## 2022-11-27 NOTE — Patient Instructions (Signed)
Hold coumadin today then resume previous so

## 2022-11-27 NOTE — Telephone Encounter (Signed)
Pt LVM on Friday, 10/25, at 4:58. Message not received until Monday morning. Pt reported she needed to update the coumadin clinic.   Tried to contact pt but had to LVM.

## 2022-11-27 NOTE — Assessment & Plan Note (Signed)
INR 4.7 Hold coumadin today and then resume previous coumadin dose  F.u coumadin clinic Friday

## 2022-11-27 NOTE — Telephone Encounter (Signed)
Received msg from PCP that INR today in [facility] was 4.7 and she advised pt to hold dose today and then continue current dosing.  Advised pt should have apt for INR recheck on 11/1. Pt agreed and has been placed on schedule for [facility].

## 2022-11-27 NOTE — Progress Notes (Signed)
+   Established Patient Office Visit  Subjective   Patient ID: [name] Little, female    DOB: [birth year]  Age: 89 y.o. MRN: [id number]  Chief Complaint  Patient presents with   Follow-up    HPI Discussed the use of AI scribe software for clinical note transcription with the patient, who gave verbal consent to proceed.  History of Present Illness   The patient, with a history of venous insufficiency, presents with ongoing leg pain and infection. She reports some improvement in mobility, but the pain persists. She has been unable to tolerate oral antibiotics due to vomiting. She expresses concern about the amount of antibiotics she has received and is reluctant to continue this treatment.  The patient is also on Coumadin for an unspecified condition, but she is having difficulty maintaining therapeutic levels. She is awaiting an appointment with a vein specialist, which she believes will resolve her leg issues. She expresses frustration with the wait for this appointment and hopes to be seen sooner.      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   Hematuria 01/29/2018   Hand injury 10/27/2017   Laceration of right hand with infection 10/26/2017   Kidney stone 09/17/2017   Aortic atherosclerosis (HCC) 08/18/2017   Mitral valve regurgitation 09/01/2016   History of UTI 08/22/2016   Lower  extremity edema 08/22/2016   BCC (basal cell carcinoma), scalp/neck 09/27/2015   Pain in limb 05/30/2015   Low back pain 03/26/2014   Type 2 diabetes mellitus with obesity (HCC) 02/26/2014   Nocturia 02/26/2014   Obesity (BMI 30-39.9) 02/03/2014   Anxiety state 09/22/2013   FH: factor V Leiden mutation 08/19/2013   Preventative health care 04/02/2013   Encounter for therapeutic drug monitoring 03/26/2013   Anticoagulant long-term use 08/23/2010   CALLUS, TOE 01/05/2010   CHEST PAIN 08/23/2009   Malignant neoplasm of female breast (HCC) 07/27/2009   Thrombophlebitis of superficial veins of left lower extremity 12/11/2007   HEMORRHOIDS 12/09/2007   History of colonic polyps 12/09/2007   Essential hypertension 09/18/2007   Venous (peripheral) insufficiency 07/02/2007   Hyperlipidemia 06/04/2007   Paroxysmal supraventricular tachycardia (HCC) 03/28/2007   Aneurysm of thoracic aorta (HCC) 11/25/2006   DVT, HX OF 08/11/2006   Past Medical History:  Diagnosis Date   Anticoagulated on Coumadin    long-term for factor V---  managed by coumadin clinic and pcp   Diet-controlled type 2 diabetes mellitus (HCC)    followed by pcp   Factor V deficiency (HCC) 1998   anticoagulated on coumadin   GAD (generalized anxiety disorder)    Heart murmur    Hemorrhoids    History of avascular necrosis of capital femoral epiphysis 1999   s/p THA right   History of basal cell carcinoma (BCC) excision    scalp/ neck   History of Clostridium difficile infection    History of DVT of lower extremity 1998;  04/ 2006  LLE   History of external beam radiation therapy 2010   right breast cancer   History of ITP    per pt hx long-term use prednisone for ITP,  1998 s/p total splenctomy   History of kidney stones    History of melanoma 1980s   s/p  right calf melanoma excision , per pt not malignant and localized ,  no recurrence   History of paroxysmal supraventricular tachycardia    (02-23-2020  pt  stated have not had any issues / symptoms in several years   History of pulmonary embolus (PE) 04/2004   History of right breast cancer 2010   08-10-2008 s/p  right lumpectomy w/ sln bx (partial mastectomy) , ER+, Stage I,  completed radiation 2010,  per pt no recurrance   Hypertension    Macular degeneration, bilateral    mild   Mitral valve regurgitation 09/01/2016   cardiology-- dr Eden Emms--- per note mild to moderate without stenosis,     Mixed hyperlipidemia 05/18/2015   Nocturia    OA (osteoarthritis)    lumbar stenosis, radiculopathy, OA- L hip   Personal history of colonic polyps    hyperplastic   Renal calculus, right    Right ureteral calculus    Thoracic ascending aortic aneurysm (HCC)    last CT angio chest in epic 11-29-2016  , 3.7cm   Past Surgical History:  Procedure Laterality Date   ABDOMINAL HYSTERECTOMY  1973   BREAST LUMPECTOMY WITH RADIOACTIVE SEED AND SENTINEL LYMPH NODE BIOPSY Right 08/10/2008   @[facility]    CATARACT EXTRACTION W/ INTRAOCULAR LENS  IMPLANT, BILATERAL Bilateral 2001   CYSTOSCOPY WITH RETROGRADE PYELOGRAM, URETEROSCOPY AND STENT PLACEMENT Right 02/25/2020   Procedure: CYSTOSCOPY WITH RETROGRADE PYELOGRAM, URETEROSCOPY AND STENT EXCHANGE;  Surgeon: Sebastian Ache, MD;  Location: [facility];  Service: Urology;  Laterality: Right;  75 MINS   CYSTOSCOPY WITH STENT PLACEMENT Right 01/27/2020   Procedure: CYSTOSCOPY WITH STENT PLACEMENT;  Surgeon: Sebastian Ache, MD;  Location: [facility];  Service: Urology;  Laterality: Right;   CYSTOSCOPY/URETEROSCOPY/HOLMIUM LASER/STENT PLACEMENT Left 06-27-2018  dr Logan Bores,  @[facility]    HOLMIUM LASER APPLICATION Right 02/25/2020   Procedure: HOLMIUM LASER APPLICATION;  Surgeon: Sebastian Ache, MD;  Location: [facility];  Service: Urology;  Laterality: Right;   I & D EXTREMITY Right 10/26/2017   Procedure: IRRIGATION AND DEBRIDEMENT RIGHT HAND;  Surgeon: Bradly Bienenstock, MD;  Location: [facility];   Service: Orthopedics;  Laterality: Right;   LUMBAR LAMINECTOMY/DECOMPRESSION MICRODISCECTOMY  04/11/2011   Procedure: LUMBAR LAMINECTOMY/DECOMPRESSION MICRODISCECTOMY;  Surgeon: Barnett Abu, MD;  Location: [facility];  Service: Neurosurgery;  Laterality: N/A;  Lumbar Five-Sacral One Microdiscectomy   MELANOMA EXCISION Right 1980s   right calf   SPLENECTOMY, TOTAL  1998   TOTAL HIP ARTHROPLASTY Right 1999   UPPER GASTROINTESTINAL ENDOSCOPY     Social History   Tobacco Use   Smoking status: Never   Smokeless tobacco: Never  Vaping Use   Vaping status: Never Used  Substance Use Topics   Alcohol use: No    Alcohol/week: 0.0 standard drinks of alcohol   Drug use: Never   Social History   Socioeconomic History   Marital status: Married    Spouse name: Not on file   Number of children: 4   Years of education: Not on file   Highest education level: Not on file  Occupational History   Occupation: Retired  Tobacco Use   Smoking status: Never   Smokeless tobacco: Never  Vaping Use   Vaping status: Never Used  Substance and Sexual Activity   Alcohol use: No    Alcohol/week: 0.0 standard drinks of alcohol   Drug use: Never   Sexual activity: Not on file  Other Topics Concern   Not on file  Social History Narrative   Married 1955   2 sons - '59, '61, 2 daughters '55, '57; 8 grandchildren; 1 great grand   I-ADLs      Social Determinants of Health   Financial Resource Strain: Not on file  Food Insecurity: No Food Insecurity (11/05/2022)   Hunger Vital Sign    Worried About Running Out of Food in the Last Year: Never true    Ran Out of Food in the Last Year: Never true  Transportation Needs: No Transportation Needs (11/05/2022)   PRAPARE - Administrator, Civil Service (Medical): No    Lack of Transportation (Non-Medical): No  Physical Activity: Not on file  Stress: Not on file  Social Connections: Not on file  Intimate Partner Violence: Not At Risk  (11/05/2022)   Humiliation, Afraid, Rape, and Kick questionnaire    Fear of Current or Ex-Partner: No    Emotionally Abused: No    Physically Abused: No    Sexually Abused: No   Family Status  Relation Name Status   Mother  Deceased   Father  Deceased   Sister 48 Deceased   Sister 74 Deceased   Sister 54 Alive   Brother 36 Deceased       BLOOD 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  (Not Specified)   Oceanographer  (Not Specified)   Other  (Not Specified)   Neg Hx  (Not Specified)  No partnership data on file   Family History  Problem Relation Age of Onset   Heart disease Mother    Heart disease Father    Alzheimer's disease Sister    Dementia Sister    Dementia Sister    Stroke Sister    Dementia Sister        mean, carotid artery disease   Clotting disorder Maternal Uncle    Breast cancer Paternal Aunt    Cancer Paternal Aunt        breast   Heart disease Other        uncles   Colon cancer Neg Hx    Anesthesia problems Neg Hx    Hypotension Neg Hx    Malignant hyperthermia Neg Hx    Pseudochol deficiency Neg Hx    Esophageal cancer Neg Hx    Rectal cancer Neg Hx    Stomach cancer Neg Hx       Review of Systems  Constitutional:  Negative for chills, fever and malaise/fatigue.  HENT:  Negative for congestion and hearing loss.   Eyes:  Negative for blurred vision and discharge.  Respiratory:  Negative for cough, sputum production and shortness of breath.   Cardiovascular:  Negative for chest pain, palpitations and leg swelling.  Gastrointestinal:  Negative for abdominal pain, blood in stool, constipation, diarrhea, heartburn, nausea and vomiting.  Genitourinary:  Negative for dysuria, frequency, hematuria and urgency.  Musculoskeletal:  Negative for back pain, falls and myalgias.  Skin:  Negative for rash.  Neurological:  Negative for dizziness, sensory change, loss of consciousness, weakness and headaches.  Endo/Heme/Allergies:  Negative for environmental allergies. Does not  bruise/bleed easily.  Psychiatric/Behavioral:  Negative for depression and suicidal ideas. The patient is not nervous/anxious and does not  have insomnia.       Objective:     BP (!) 160/90 (BP Location: Left Arm, Patient Position: Sitting, Cuff Size: Large)   Pulse 74   Temp 97.8 F (36.6 C) (Oral)   Resp 18   Ht 5\' 2"  (1.575 m)   Wt 169 lb (76.7 kg)   SpO2 94%   BMI 30.91 kg/m  BP Readings from Last 3 Encounters:  11/27/22 (!) 160/90  11/23/22 122/70  11/20/22 (!) 140/90   Wt Readings from Last 3 Encounters:  11/27/22 169 lb (76.7 kg)  11/23/22 169 lb (76.7 kg)  11/16/22 169 lb (76.7 kg)   SpO2 Readings from Last 3 Encounters:  11/27/22 94%  11/23/22 93%  11/20/22 93%      Physical Exam Vitals and nursing note reviewed.  Constitutional:      General: She is not in acute distress.    Appearance: Normal appearance. She is well-developed.  HENT:     Head: Normocephalic and atraumatic.  Eyes:     General: No scleral icterus.       Right eye: No discharge.        Left eye: No discharge.  Cardiovascular:     Rate and Rhythm: Normal rate and regular rhythm.     Heart sounds: No murmur heard. Pulmonary:     Effort: Pulmonary effort is normal. No respiratory distress.     Breath sounds: Normal breath sounds.  Musculoskeletal:        General: Normal range of motion.     Cervical back: Normal range of motion and neck supple.     Right lower leg: No edema.     Left lower leg: No edema.  Skin:    General: Skin is warm and dry.  Neurological:     Mental Status: She is alert and oriented to person, place, and time.  Psychiatric:        Mood and Affect: Mood normal.        Behavior: Behavior normal.        Thought Content: Thought content normal.        Judgment: Judgment normal.      Results for orders placed or performed in visit on 11/27/22  POCT INR  Result Value Ref Range   INR 4.9 (A) 2.0 - 3.0   POC INR    POCT INR  Result Value Ref Range   INR 4.7  (A) 2.0 - 3.0   POC INR      Last CBC Lab Results  Component Value Date   WBC 8.1 11/07/2022   HGB 14.4 11/07/2022   HCT 44.9 11/07/2022   MCV 92.4 11/07/2022   MCH 30.4 11/05/2022   RDW 14.8 11/07/2022   PLT 319.0 11/07/2022   Last metabolic panel Lab Results  Component Value Date   GLUCOSE 127 (H) 11/07/2022   NA 139 11/07/2022   K 4.4 11/07/2022   CL 106 11/07/2022   CO2 27 11/07/2022   BUN 36 (H) 11/07/2022   CREATININE 0.96 11/07/2022   GFR 53.43 (L) 11/07/2022   CALCIUM 9.6 11/07/2022   PHOS 3.0 11/19/2013   PROT 6.5 11/07/2022   ALBUMIN 4.0 11/07/2022   BILITOT 0.7 11/07/2022   ALKPHOS 66 11/07/2022   AST 14 11/07/2022   ALT 10 11/07/2022   ANIONGAP 10 11/05/2022   Last lipids Lab Results  Component Value Date   CHOL 154 04/24/2022   HDL 34.40 (L) 04/24/2022   LDLCALC 86 04/24/2022  LDLDIRECT 144.0 02/09/2022   TRIG 167.0 (H) 04/24/2022   CHOLHDL 4 04/24/2022   Last hemoglobin A1c Lab Results  Component Value Date   HGBA1C 6.7 (H) 04/24/2022   Last thyroid functions Lab Results  Component Value Date   TSH 3.57 04/24/2022   Last vitamin D Lab Results  Component Value Date   VD25OH 26.23 (L) 07/14/2021   Last vitamin B12 and Folate Lab Results  Component Value Date   VITAMINB12 622 02/09/2022   FOLATE 15.9 02/04/2008      The ASCVD Risk score (Arnett DK, et al., 2019) failed to calculate for the following reasons:   The 2019 ASCVD risk score is only valid for ages 78 to 58    Assessment & Plan:  Assessment and Plan    Lower Extremity Infection Patient reports intolerance to oral antibiotics due to vomiting. Patient declined further antibiotics due to concern of overuse. -Check INR today. -Attempt to expedite appointment with vein specialist (currently scheduled for 12/13/2022).  Anticoagulation Management Patient reports difficulty with Coumadin management. -Check INR today. -Adjust Coumadin dose as needed based on INR result.        Problem List Items Addressed This Visit       Unprioritized   Thrombophlebitis of superficial veins of left lower extremity - Primary    INR 4.7 Hold coumadin today and then resume previous coumadin dose  F.u coumadin clinic Friday      Relevant Orders   POCT INR (Completed)   POCT INR (Completed)   Other Visit Diagnoses     On continuous oral anticoagulation       Relevant Orders   POCT INR (Completed)   POCT INR (Completed)       No follow-ups on file.    Donato Schultz, DO

## 2022-11-28 ENCOUNTER — Ambulatory Visit: Payer: Medicare PPO

## 2022-11-28 ENCOUNTER — Ambulatory Visit: Payer: Medicare PPO | Admitting: Vascular Surgery

## 2022-11-28 ENCOUNTER — Encounter: Payer: Self-pay | Admitting: Vascular Surgery

## 2022-11-28 VITALS — BP 172/73 | HR 65 | Temp 97.5°F | Ht 63.0 in | Wt 175.0 lb

## 2022-11-28 DIAGNOSIS — I8002 Phlebitis and thrombophlebitis of superficial vessels of left lower extremity: Secondary | ICD-10-CM

## 2022-11-28 NOTE — Progress Notes (Signed)
Patient name: [name] Little MRN: [id number] DOB: 08-09-1933 Sex: female  REASON FOR CONSULT: Left leg superficial vein thrombosis in varicose veins on coumadin  HPI: [name] Little is a 89 y.o. 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  Patient states she had discomfort in her left leg that started beginning of the month.  She was off her Coumadin for several weeks for a GI bleed.  Ultimately this was found to be a diverticular bleed and she was started back on her anticoagulation.  INR now greater than 4 and therapeutic.  States she is hopeful she will be able to stay on her Coumadin.  She states she had a blood clot in her left leg about 30 years ago when her factor V Leiden deficiency was first diagnosed and prompted her chronic anticoagulation.  Her left leg is slowly getting better.  Past Medical History:  Diagnosis Date   Anticoagulated on Coumadin    long-term for factor V---  managed by coumadin clinic and pcp   Diet-controlled type 2 diabetes mellitus (HCC)    followed by pcp   Factor V deficiency (HCC) 1998   anticoagulated on coumadin   GAD (generalized anxiety disorder)    Heart murmur    Hemorrhoids    History of avascular necrosis of capital femoral epiphysis 1999   s/p THA right   History of basal cell carcinoma (BCC) excision    scalp/ neck   History of Clostridium difficile infection    History of DVT of lower extremity 1998;  04/ 2006   LLE   History of external beam radiation therapy 2010   right breast cancer   History of ITP    per pt hx long-term use prednisone for ITP,  1998 s/p total splenctomy   History of kidney stones    History of melanoma 1980s   s/p  right calf melanoma excision , per pt not malignant and localized ,  no recurrence   History of paroxysmal supraventricular tachycardia    (02-23-2020  pt  stated have not had any issues / symptoms in several years   History of pulmonary embolus (PE) 04/2004   History of right breast cancer 2010   08-10-2008 s/p  right lumpectomy w/ sln bx (partial mastectomy) , ER+, Stage I,  completed radiation 2010,  per pt no recurrance   Hypertension    Macular degeneration, bilateral    mild   Mitral valve regurgitation 09/01/2016   cardiology-- dr Eden Emms--- per note mild to moderate without stenosis,     Mixed hyperlipidemia 05/18/2015   Nocturia    OA (osteoarthritis)    lumbar stenosis, radiculopathy, OA- L hip   Personal history of colonic polyps    hyperplastic   Renal calculus, right    Right ureteral calculus    Thoracic ascending aortic aneurysm (HCC)    last CT angio chest in epic 11-29-2016  , 3.7cm    Past Surgical History:  Procedure Laterality Date   ABDOMINAL HYSTERECTOMY  1973   BREAST LUMPECTOMY WITH RADIOACTIVE SEED AND SENTINEL LYMPH NODE BIOPSY Right 08/10/2008   @[facility]    CATARACT EXTRACTION W/ INTRAOCULAR LENS  IMPLANT, BILATERAL Bilateral 2001   CYSTOSCOPY WITH RETROGRADE PYELOGRAM, URETEROSCOPY AND STENT PLACEMENT Right 02/25/2020   Procedure: CYSTOSCOPY WITH RETROGRADE PYELOGRAM, URETEROSCOPY AND STENT EXCHANGE;  Surgeon: Sebastian Ache, MD;  Location: [facility];  Service: Urology;  Laterality: Right;  75 MINS   CYSTOSCOPY WITH STENT PLACEMENT Right 01/27/2020   Procedure: CYSTOSCOPY WITH STENT PLACEMENT;  Surgeon: Sebastian Ache, MD;  Location: [facility];  Service: Urology;  Laterality: Right;   CYSTOSCOPY/URETEROSCOPY/HOLMIUM LASER/STENT PLACEMENT Left 06-27-2018  dr Logan Bores,  @[facility]    HOLMIUM LASER APPLICATION Right 02/25/2020   Procedure: HOLMIUM LASER APPLICATION;  Surgeon: Sebastian Ache, MD;  Location: [facility];  Service: Urology;  Laterality: Right;   I & D EXTREMITY Right 10/26/2017   Procedure: IRRIGATION AND DEBRIDEMENT RIGHT HAND;  Surgeon: Bradly Bienenstock, MD;  Location: [facility];   Service: Orthopedics;  Laterality: Right;   LUMBAR LAMINECTOMY/DECOMPRESSION MICRODISCECTOMY  04/11/2011   Procedure: LUMBAR LAMINECTOMY/DECOMPRESSION MICRODISCECTOMY;  Surgeon: Barnett Abu, MD;  Location: [facility];  Service: Neurosurgery;  Laterality: N/A;  Lumbar Five-Sacral One Microdiscectomy   MELANOMA EXCISION Right 1980s   right calf   SPLENECTOMY, TOTAL  1998   TOTAL HIP ARTHROPLASTY Right 1999   UPPER GASTROINTESTINAL ENDOSCOPY      Family History  Problem Relation Age of Onset   Heart disease Mother    Heart disease Father    Alzheimer's disease Sister    Dementia Sister    Dementia Sister    Stroke Sister    Dementia Sister        mean, carotid artery disease   Clotting disorder Maternal Uncle    Breast cancer Paternal Aunt    Cancer Paternal Aunt        breast   Heart disease Other        uncles   Colon cancer Neg Hx    Anesthesia problems Neg Hx    Hypotension Neg Hx    Malignant hyperthermia Neg Hx    Pseudochol deficiency Neg Hx    Esophageal cancer Neg Hx    Rectal cancer Neg Hx    Stomach cancer Neg Hx     SOCIAL HISTORY: Social History   Socioeconomic History   Marital status: Married    Spouse name: Not on file   Number of children: 4   Years of education: Not on file   Highest education level: Not on file  Occupational History   Occupation: Retired  Tobacco Use   Smoking status: Never   Smokeless tobacco: Never  Vaping Use   Vaping status: Never Used  Substance and Sexual Activity   Alcohol use: No    Alcohol/week: 0.0 standard drinks of alcohol   Drug use: Never   Sexual activity: Not on file  Other Topics Concern   Not on file  Social History Narrative   Married 1955   2 sons - '59, '61, 2 daughters '55, '57; 8 grandchildren; 1 great grand   I-ADLs      Social Determinants of Health   Financial Resource Strain: Not on file  Food Insecurity: No Food Insecurity (11/05/2022)   Hunger Vital Sign    Worried About Running Out  of Food in the Last Year: Never true    Ran Out of Food in the Last Year: Never true  Transportation Needs: No Transportation Needs (11/05/2022)   PRAPARE - Administrator, Civil Service (Medical): No    Lack of Transportation (Non-Medical): No  Physical Activity: Not on file  Stress: Not on file  Social Connections: Not on file  Intimate Partner Violence: Not At Risk (11/05/2022)   Humiliation, Afraid, Rape, and Kick questionnaire    Fear of Current or Ex-Partner: No  Emotionally Abused: No    Physically Abused: No    Sexually Abused: No    Allergies  Allergen Reactions   Amlodipine Swelling    "my whole body swelled"   Carvedilol Swelling    "my whole body swelled"   Chlorthalidone     Patient reported extreme weakness.   Hydralazine Other (See Comments)    Joint and chest pain   Statins Other (See Comments)    Weakness, pain Lipitor and Pravachol Weakness, pain Lipitor and Pravachol   Zetia [Ezetimibe]     Unable to walk, severe leg pain   Cefdinir     On blood thinnner   Lyrica [Pregabalin] Other (See Comments)    Weakness and joint pain   Cefuroxime Axetil     REACTION: Ddoes not remember reaction   Sulfonamide Derivatives Rash    REACTION: Rash    Current Outpatient Medications  Medication Sig Dispense Refill   Accu-Chek Softclix Lancets lancets Use to check sugar daily or as needed. (Patient not taking: Reported on 11/09/2022) 100 each 1   amLODipine (NORVASC) 5 MG tablet Take 1 tablet (5 mg total) by mouth daily. 90 tablet 1   B Complex Vitamins (VITAMIN B COMPLEX PO) Take 1 tablet by mouth daily.     Blood Pressure Monitoring (BLOOD PRESSURE KIT) DEVI 1 Device by Does not apply route daily as needed. Fill per patient insurance preference Dx: Hypertension 1 Device 0   Cranberry 1000 MG CAPS Take 1 capsule by mouth 2 (two) times daily.     dicloxacillin (DYNAPEN) 250 MG capsule Take 1 capsule (250 mg total) by mouth 4 (four) times daily. 40 capsule  0   glucose blood (ACCU-CHEK GUIDE) test strip USE TO TEST BLOOD SUGAR UP TO 4 TIMES DAILY AS INSTRUCTED (Patient not taking: Reported on 11/09/2022) 100 strip 5   Nebivolol HCl 20 MG TABS Take 1 tablet (20 mg total) by mouth daily. 90 tablet 3   NONFORMULARY OR COMPOUNDED ITEM Compression socks  20-30 mm/hg #1  as directed 1 each 1   valsartan (DIOVAN) 320 MG tablet Take 1 tablet (320 mg total) by mouth daily. 90 tablet 3   warfarin (COUMADIN) 5 MG tablet TAKE 1 TABLET BY MOUTH DAILY EXCEPT TAKE 1/2 TABLET ON TUESDAYS, THURSDAY, SATURDAYS OR AS DIRECTED BY ANTICOAGULATION CLINIC 100 tablet 1   No current facility-administered medications for this visit.    REVIEW OF SYSTEMS:  [X]  denotes positive finding, [ ]  denotes negative finding Cardiac  Comments:  Chest pain or chest pressure:    Shortness of breath upon exertion:    Short of breath when lying flat:    Irregular heart rhythm:        Vascular    Pain in calf, thigh, or hip brought on by ambulation:    Pain in feet at night that wakes you up from your sleep:     Blood clot in your veins:    Leg swelling:  x left      Pulmonary    Oxygen at home:    Productive cough:     Wheezing:         Neurologic    Sudden weakness in arms or legs:     Sudden numbness in arms or legs:     Sudden onset of difficulty speaking or slurred speech:    Temporary loss of vision in one eye:     Problems with dizziness:         Gastrointestinal  Blood in stool:     Vomited blood:         Genitourinary    Burning when urinating:     Blood in urine:        Psychiatric    Major depression:         Hematologic    Bleeding problems:    Problems with blood clotting too easily:        Skin    Rashes or ulcers:        Constitutional    Fever or chills:      PHYSICAL EXAM: There were no vitals filed for this visit.  GENERAL: The patient is a well-nourished female, in no acute distress. The vital signs are documented above. CARDIAC:  There is a regular rate and rhythm.  VASCULAR:  Left DP palpable Notable tenderness along the thrombosed varicosities in the left leg in the thigh and calf medially PULMONARY: No respiratory distress ABDOMEN: Soft and non-tender. MUSCULOSKELETAL: There are no major deformities or cyanosis. NEUROLOGIC: No focal weakness or paresthesias are detected. PSYCHIATRIC: The patient has a normal affect.  DATA:   Narrative & Impression  CLINICAL DATA:  Leg swelling/pain, factor 5 Leiden clotting disorder, recently off Coumadin (now restarted)   EXAM: LEFT LOWER EXTREMITY VENOUS DOPPLER ULTRASOUND   TECHNIQUE: Gray-scale sonography with compression, as well as color and duplex ultrasound, were performed to evaluate the deep venous system(s) from the level of the common femoral vein through the popliteal and proximal calf veins.   COMPARISON:  None Available.   FINDINGS: VENOUS   Normal compressibility of the common femoral, superficial femoral, and popliteal veins, as well as the visualized calf veins. Visualized portions of profunda femoral vein and great saphenous vein unremarkable. No filling defects to suggest DVT on grayscale or color Doppler imaging. Doppler waveforms show normal direction of venous flow, normal respiratory plasticity and response to augmentation.   Limited views of the contralateral common femoral vein are unremarkable.   OTHER   Extensive superficial thrombus involving varicose veins standing from the medial thigh to the lower calf/ankle.   Limitations: none   IMPRESSION: No evidence of left lower extremity DVT.   Extensive superficial thrombus involving varicose veins standing from the medial thigh to the lower calf/ankle.     Electronically Signed   By: Charline Bills M.D.   On: 11/16/2022 17:04    Assessment/Plan:  89 y.o. female, with history of factor V deficiency on chronic anticoagulation with Coumadin, diabetes, hypertension,  hyperlipidemia that presents for evaluation superficial thrombosis of left leg varicose veins.  Ultimately I discussed that we would normally offer anticoagulation for extensive superficial vein thrombosis.  That being said she is already on Coumadin.  This is not a treatment failure as she was off anticoagulation for several weeks in the setting of factor V Leiden deficiency for GI bleed.  She is now back on coumadin and tolerating this with therapeutic INR.  I think continuing her Coumadin should be perfectly appropriate.  I think this will get better slowly with time and she is seeing some improvement since restarting.  We did get her sized for thigh-high compression stockings today that should help as well.  I discussed warm compresses at night.  I discussed I am happy to follow-up with her again in a month but she can let me know if things are no better.  No indication for intervention from a surgical standpoint.  Discussed this is not a deep vein thrombosis and  is not at risk of thromboembolism.   Cephus Shelling, MD [facility] Office: [phone]

## 2022-12-01 ENCOUNTER — Ambulatory Visit: Payer: Medicare PPO

## 2022-12-01 DIAGNOSIS — Z7901 Long term (current) use of anticoagulants: Secondary | ICD-10-CM | POA: Diagnosis not present

## 2022-12-01 LAB — POCT INR: INR: 4.4 — AB (ref 2.0–3.0)

## 2022-12-01 NOTE — Patient Instructions (Addendum)
Pre visit review using our clinic review tool, if applicable. No additional management support is needed unless otherwise documented below in the visit note.  Hold dose today and hold dose tomorrow and then continue 1 tablet daily except take 1/2 tablet on Tuesdays, Thursdays and Saturdays. Recheck in 1 weeks.

## 2022-12-01 NOTE — Progress Notes (Signed)
Pt recently diagnosed with thrombophlebitis and superficial thrombus in varicose vein. Pt has seen vascular surgery concerning the thrombus and they do not recommend any surgery. Pt denies any s/s of abnormal bruising or bleeding. Pt reports her leg is doing much better and she no longer has any pain. Pt is supratherapeutic today but due to her stable INR hx on this dosing there will not be a change in weekly dosing. Will recheck INR in 1 week.  Advised pt if any s/s of abnormal bruising or bleeding to go to ER. Pt verbalized understanding.  Hold dose today and hold dose tomorrow and then continue 1 tablet daily except take 1/2 tablet on Tuesdays, Thursdays and Saturdays. Recheck in 1 weeks.

## 2022-12-05 ENCOUNTER — Ambulatory Visit
Admission: RE | Admit: 2022-12-05 | Discharge: 2022-12-05 | Disposition: A | Discharge status: Home / Self Care | Payer: Medicare PPO | Source: Ambulatory Visit | Attending: Family Medicine | Admitting: Family Medicine

## 2022-12-05 DIAGNOSIS — Z1231 Encounter for screening mammogram for malignant neoplasm of breast: Secondary | ICD-10-CM

## 2022-12-08 ENCOUNTER — Ambulatory Visit: Payer: Medicare PPO

## 2022-12-08 ENCOUNTER — Telehealth: Payer: Self-pay | Admitting: Family Medicine

## 2022-12-08 DIAGNOSIS — Z7901 Long term (current) use of anticoagulants: Secondary | ICD-10-CM

## 2022-12-08 LAB — POCT INR: INR: 2 (ref 2.0–3.0)

## 2022-12-08 NOTE — Telephone Encounter (Signed)
Patient is requesting TOC to [facility] from Dr. Zola Button. Our coumadin nurse feels that Dr. Posey Rea would be the best fir for the patient. Please advise.

## 2022-12-08 NOTE — Patient Instructions (Addendum)
Pre visit review using our clinic review tool, if applicable. No additional management support is needed unless otherwise documented below in the visit note.  Continue 1 tablet daily except take 1/2 tablet on Tuesdays, Thursdays and Saturdays. Recheck in 2 weeks.

## 2022-12-08 NOTE — Progress Notes (Signed)
Continue 1 tablet daily except take 1/2 tablet on Tuesdays, Thursdays and Saturdays. Recheck in 2 weeks.

## 2022-12-12 ENCOUNTER — Encounter: Payer: Medicare PPO | Admitting: Family Medicine

## 2022-12-13 ENCOUNTER — Encounter: Payer: Medicare PPO | Admitting: Vascular Surgery

## 2022-12-19 ENCOUNTER — Ambulatory Visit: Payer: Medicare PPO

## 2022-12-19 DIAGNOSIS — Z7901 Long term (current) use of anticoagulants: Secondary | ICD-10-CM

## 2022-12-19 LAB — POCT INR: INR: 2.6 (ref 2.0–3.0)

## 2022-12-19 NOTE — Patient Instructions (Addendum)
Pre visit review using our clinic review tool, if applicable. No additional management support is needed unless otherwise documented below in the visit note.  Continue 1 tablet daily except take 1/2 tablet on Tuesdays, Thursdays and Saturdays. Recheck in 7 weeks.

## 2022-12-19 NOTE — Progress Notes (Signed)
Continue 1 tablet daily except take 1/2 tablet on Tuesdays, Thursdays and Saturdays. Recheck in 7 weeks due to holiday.

## 2022-12-19 NOTE — Telephone Encounter (Signed)
Contacted pt and advised Dr. Posey Rea has agreed to take her as a pt. Advised when she needs her next apt to contact office for apt. Pt verbalized understanding and was very appreciative.

## 2022-12-22 NOTE — Telephone Encounter (Signed)
Okay.  Thanks.

## 2022-12-26 ENCOUNTER — Encounter: Payer: Self-pay | Admitting: Internal Medicine

## 2022-12-26 ENCOUNTER — Ambulatory Visit: Payer: Medicare PPO | Admitting: Internal Medicine

## 2022-12-26 VITALS — BP 134/70 | HR 81 | Temp 98.6°F | Ht 63.0 in | Wt 168.0 lb

## 2022-12-26 DIAGNOSIS — I10 Essential (primary) hypertension: Secondary | ICD-10-CM

## 2022-12-26 DIAGNOSIS — D682 Hereditary deficiency of other clotting factors: Secondary | ICD-10-CM | POA: Diagnosis not present

## 2022-12-26 DIAGNOSIS — I8002 Phlebitis and thrombophlebitis of superficial vessels of left lower extremity: Secondary | ICD-10-CM | POA: Diagnosis not present

## 2022-12-26 DIAGNOSIS — Z7901 Long term (current) use of anticoagulants: Secondary | ICD-10-CM

## 2022-12-26 DIAGNOSIS — E1169 Type 2 diabetes mellitus with other specified complication: Secondary | ICD-10-CM

## 2022-12-26 DIAGNOSIS — Z23 Encounter for immunization: Secondary | ICD-10-CM

## 2022-12-26 DIAGNOSIS — N760 Acute vaginitis: Secondary | ICD-10-CM

## 2022-12-26 DIAGNOSIS — C50919 Malignant neoplasm of unspecified site of unspecified female breast: Secondary | ICD-10-CM

## 2022-12-26 DIAGNOSIS — I471 Supraventricular tachycardia, unspecified: Secondary | ICD-10-CM | POA: Diagnosis not present

## 2022-12-26 DIAGNOSIS — E669 Obesity, unspecified: Secondary | ICD-10-CM

## 2022-12-26 MED ORDER — FLUCONAZOLE 150 MG PO TABS: 150.0000 mg | ORAL_TABLET | Freq: Once | ORAL | 1 refills | Status: AC

## 2022-12-26 NOTE — Assessment & Plan Note (Signed)
Yearly mammogram.

## 2022-12-26 NOTE — Assessment & Plan Note (Signed)
Check A1

## 2022-12-26 NOTE — Assessment & Plan Note (Signed)
On Coumadin for h/o DVT x 40 years

## 2022-12-26 NOTE — Progress Notes (Signed)
Subjective:  Patient ID: [name] Little, female    DOB: May 17, 1933  Age: 89 y.o. MRN: [id number]  CC: New Patient (Initial Visit)   HPI [name] Little presents for a new pt visit - anticoagulation, factor 5 def C/o yeast infection after the abx   Outpatient Medications Prior to Visit  Medication Sig Dispense Refill   amLODipine (NORVASC) 5 MG tablet Take 1 tablet (5 mg total) by mouth daily. 90 tablet 1   B Complex Vitamins (VITAMIN B COMPLEX PO) Take 1 tablet by mouth daily.     Cranberry 1000 MG CAPS Take 1 capsule by mouth 2 (two) times daily.     Nebivolol HCl 20 MG TABS Take 1 tablet (20 mg total) by mouth daily. 90 tablet 3   valsartan (DIOVAN) 320 MG tablet Take 1 tablet (320 mg total) by mouth daily. 90 tablet 3   warfarin (COUMADIN) 5 MG tablet TAKE 1 TABLET BY MOUTH DAILY EXCEPT TAKE 1/2 TABLET ON TUESDAYS, THURSDAY, SATURDAYS OR AS DIRECTED BY ANTICOAGULATION CLINIC 100 tablet 1   Blood Pressure Monitoring (BLOOD PRESSURE KIT) DEVI 1 Device by Does not apply route daily as needed. Fill per patient insurance preference Dx: Hypertension 1 Device 0   dicloxacillin (DYNAPEN) 250 MG capsule Take 1 capsule (250 mg total) by mouth 4 (four) times daily. 40 capsule 0   NONFORMULARY OR COMPOUNDED ITEM Compression socks  20-30 mm/hg #1  as directed 1 each 1   No facility-administered medications prior to visit.    ROS: Review of Systems  Constitutional:  Negative for activity change, appetite change, chills, fatigue and unexpected weight change.  HENT:  Negative for congestion, mouth sores and sinus pressure.   Eyes:  Negative for visual disturbance.  Respiratory:  Negative for cough and chest tightness.   Gastrointestinal:  Negative for abdominal pain and nausea.  Genitourinary:  Negative for difficulty urinating, frequency, urgency, vaginal bleeding, vaginal discharge and vaginal pain.  Musculoskeletal:  Negative for back pain and gait problem.  Skin:  Negative for  pallor and rash.  Neurological:  Negative for dizziness, tremors, weakness, numbness and headaches.  Psychiatric/Behavioral:  Negative for confusion and sleep disturbance.     Objective:  BP 134/70 (BP Location: Right Arm, Patient Position: Sitting, Cuff Size: Normal)   Pulse 81   Temp 98.6 F (37 C) (Oral)   Ht 5\' 3"  (1.6 m)   Wt 168 lb (76.2 kg)   SpO2 94%   BMI 29.76 kg/m   BP Readings from Last 3 Encounters:  12/26/22 134/70  11/28/22 (!) 172/73  11/27/22 (!) 160/90    Wt Readings from Last 3 Encounters:  12/26/22 168 lb (76.2 kg)  11/28/22 175 lb (79.4 kg)  11/27/22 169 lb (76.7 kg)    Physical Exam Constitutional:      General: She is not in acute distress.    Appearance: She is well-developed.  HENT:     Head: Normocephalic.     Right Ear: External ear normal.     Left Ear: External ear normal.     Nose: Nose normal.  Eyes:     General:        Right eye: No discharge.        Left eye: No discharge.     Conjunctiva/sclera: Conjunctivae normal.     Pupils: Pupils are equal, round, and reactive to light.  Neck:     Thyroid: No thyromegaly.     Vascular: No JVD.  Trachea: No tracheal deviation.  Cardiovascular:     Rate and Rhythm: Normal rate and regular rhythm.     Heart sounds: Normal heart sounds.  Pulmonary:     Effort: No respiratory distress.     Breath sounds: No stridor. No wheezing.  Abdominal:     General: Bowel sounds are normal. There is no distension.     Palpations: Abdomen is soft. There is no mass.     Tenderness: There is no abdominal tenderness. There is no guarding or rebound.  Musculoskeletal:        General: No tenderness.     Cervical back: Normal range of motion and neck supple. No rigidity.  Lymphadenopathy:     Cervical: No cervical adenopathy.  Skin:    Findings: No erythema or rash.  Neurological:     Mental Status: She is oriented to person, place, and time.     Cranial Nerves: No cranial nerve deficit.     Motor:  No abnormal muscle tone.     Coordination: Coordination normal.     Deep Tendon Reflexes: Reflexes normal.  Psychiatric:        Behavior: Behavior normal.        Thought Content: Thought content normal.        Judgment: Judgment normal.     A total time of 45 minutes was spent preparing to see the patient, reviewing tests, x-rays, operative reports and other medical records.  Also, obtaining history and performing comprehensive physical exam.  Additionally, counseling the patient regarding the above listed issues - coumadin, HTN, other.   Finally, documenting clinical information in the health records, coordination of care, educating the patient.   Lab Results  Component Value Date   WBC 8.1 11/07/2022   HGB 14.4 11/07/2022   HCT 44.9 11/07/2022   PLT 319.0 11/07/2022   GLUCOSE 127 (H) 11/07/2022   CHOL 154 04/24/2022   TRIG 167.0 (H) 04/24/2022   HDL 34.40 (L) 04/24/2022   LDLDIRECT 144.0 02/09/2022   LDLCALC 86 04/24/2022   ALT 10 11/07/2022   AST 14 11/07/2022   NA 139 11/07/2022   K 4.4 11/07/2022   CL 106 11/07/2022   CREATININE 0.96 11/07/2022   BUN 36 (H) 11/07/2022   CO2 27 11/07/2022   TSH 3.57 04/24/2022   INR 2.6 12/19/2022   HGBA1C 6.7 (H) 04/24/2022   MICROALBUR 2.3 (H) 04/24/2022    MM 3D SCREENING MAMMOGRAM BILATERAL BREAST  Result Date: 12/07/2022 CLINICAL DATA:  Screening. EXAM: DIGITAL SCREENING BILATERAL MAMMOGRAM WITH TOMOSYNTHESIS AND CAD TECHNIQUE: Bilateral screening digital craniocaudal and mediolateral oblique mammograms were obtained. Bilateral screening digital breast tomosynthesis was performed. The images were evaluated with computer-aided detection. COMPARISON:  Previous exam(s). ACR Breast Density Category b: There are scattered areas of fibroglandular density. FINDINGS: There are no findings suspicious for malignancy. IMPRESSION: No mammographic evidence of malignancy. A result letter of this screening mammogram will be mailed directly to the  patient. RECOMMENDATION: Screening mammogram in one year. (Code:SM-B-01Y) BI-RADS CATEGORY  1: Negative. Electronically Signed   By: Sherian Rein M.D.   On: 12/07/2022 13:14    Assessment & Plan:   Problem List Items Addressed This Visit     Malignant neoplasm of female breast (HCC)    Yearly mammogram      Relevant Medications   fluconazole (DIFLUCAN) 150 MG tablet   Essential hypertension - Primary    On Amlodipine      Paroxysmal supraventricular tachycardia (HCC)  Cont on Amlodipine, Nebivilol, Valsartan      Thrombophlebitis of superficial veins of left lower extremity    On Coumadin for h/o DVT x 40 years      Anticoagulant long-term use    On Coumadin for h/o DVT x 40 years      Type 2 diabetes mellitus with obesity (HCC)    Check A1      Factor V deficiency (HCC)    On Coumadin for h/o DVT x 40 years      Vaginitis    Diflucan po x1         Meds ordered this encounter  Medications   fluconazole (DIFLUCAN) 150 MG tablet    Sig: Take 1 tablet (150 mg total) by mouth once for 1 dose.    Dispense:  1 tablet    Refill:  1      Follow-up: No follow-ups on file.  Sonda Primes, MD

## 2022-12-26 NOTE — Addendum Note (Signed)
Addended by: Delsa Grana R on: 12/26/2022 11:02 AM   Modules accepted: Orders

## 2022-12-26 NOTE — Assessment & Plan Note (Signed)
Cont on Amlodipine, Nebivilol, Valsartan

## 2022-12-26 NOTE — Assessment & Plan Note (Signed)
On Amlodipine

## 2022-12-26 NOTE — Assessment & Plan Note (Signed)
Diflucan po x1

## 2023-01-08 ENCOUNTER — Inpatient Hospital Stay: Payer: Medicare PPO | Admitting: Medical Oncology

## 2023-01-08 ENCOUNTER — Other Ambulatory Visit: Payer: Medicare PPO

## 2023-01-09 DIAGNOSIS — Z86006 Personal history of melanoma in-situ: Secondary | ICD-10-CM | POA: Diagnosis not present

## 2023-01-09 DIAGNOSIS — L570 Actinic keratosis: Secondary | ICD-10-CM | POA: Diagnosis not present

## 2023-01-09 DIAGNOSIS — L578 Other skin changes due to chronic exposure to nonionizing radiation: Secondary | ICD-10-CM | POA: Diagnosis not present

## 2023-01-09 DIAGNOSIS — C4359 Malignant melanoma of other part of trunk: Secondary | ICD-10-CM | POA: Diagnosis not present

## 2023-01-09 DIAGNOSIS — Z86007 Personal history of in-situ neoplasm of skin: Secondary | ICD-10-CM | POA: Diagnosis not present

## 2023-01-09 DIAGNOSIS — Z08 Encounter for follow-up examination after completed treatment for malignant neoplasm: Secondary | ICD-10-CM | POA: Diagnosis not present

## 2023-02-06 ENCOUNTER — Ambulatory Visit: Payer: Medicare PPO

## 2023-02-06 DIAGNOSIS — Z7901 Long term (current) use of anticoagulants: Secondary | ICD-10-CM | POA: Diagnosis not present

## 2023-02-06 LAB — POCT INR: INR: 2.3 (ref 2.0–3.0)

## 2023-02-06 NOTE — Patient Instructions (Addendum)
Pre visit review using our clinic review tool, if applicable. No additional management support is needed unless otherwise documented below in the visit note.  Continue 1 tablet daily except take 1/2 tablet on Tuesdays, Thursdays and Saturdays. Recheck in 6 weeks. 

## 2023-02-06 NOTE — Progress Notes (Addendum)
 Pt was prescribed fluconazole  on 11/26, x 1 tablet. Pt took it that week. Advised pt this has a major interaction with warfarin and to contact coumadin  clinic if it is prescribed again.  Continue 1 tablet daily except take 1/2 tablet on Tuesdays, Thursdays and Saturdays. Recheck in 6 weeks.  Medical screening examination/treatment/procedure(s) were performed by non-physician practitioner and as supervising physician I was immediately available for consultation/collaboration.  I agree with above. Karlynn Noel, MD

## 2023-02-19 ENCOUNTER — Ambulatory Visit: Payer: Self-pay | Admitting: Internal Medicine

## 2023-02-19 ENCOUNTER — Encounter: Payer: Self-pay | Admitting: Family Medicine

## 2023-02-19 ENCOUNTER — Ambulatory Visit: Payer: Medicare PPO | Admitting: Family Medicine

## 2023-02-19 VITALS — BP 138/74 | HR 85 | Temp 97.9°F | Resp 20 | Ht 63.0 in | Wt 174.0 lb

## 2023-02-19 DIAGNOSIS — R062 Wheezing: Secondary | ICD-10-CM | POA: Diagnosis not present

## 2023-02-19 DIAGNOSIS — J40 Bronchitis, not specified as acute or chronic: Secondary | ICD-10-CM

## 2023-02-19 MED ORDER — ALBUTEROL SULFATE HFA 108 (90 BASE) MCG/ACT IN AERS: 2.0000 | INHALATION_SPRAY | Freq: Four times a day (QID) | RESPIRATORY_TRACT | 0 refills | Status: AC | PRN

## 2023-02-19 MED ORDER — PREDNISONE 20 MG PO TABS: 40.0000 mg | ORAL_TABLET | Freq: Every day | ORAL | 0 refills | Status: AC

## 2023-02-19 NOTE — Progress Notes (Signed)
Assessment & Plan:  1. Bronchitis (Primary) Education provided on bronchitis. Education provided on proper use of Albuterol inhaler; patient verbalized understanding.  - predniSONE (DELTASONE) 20 MG tablet; Take 2 tablets (40 mg total) by mouth daily for 5 days.  Dispense: 10 tablet; Refill: 0 - albuterol (VENTOLIN HFA) 108 (90 Base) MCG/ACT inhaler; Inhale 2 puffs into the lungs every 6 (six) hours as needed.  Dispense: 18 g; Refill: 0  No results found for any visits on 02/19/23.  Follow up plan: Return if symptoms worsen or fail to improve.  Deliah Boston, MSN, APRN, FNP-C  Subjective:  HPI: [name] Little is a 89 y.o. female presenting on 02/19/2023 for Cough (Cough with congestion that is thick and yellow - this started 3 days ago /No fever, no body aches and no GI upset )  Patient complains of cough, chest congestion, and wheezing. Phlegm is thick and yellow. Wheezing only occurs when she needs to cough stuff up and then it resolves. She denies fever, shortness of breath, nausea, vomiting, and body aches . Onset of symptoms was 3 days ago, gradually worsening since that time. She is drinking plenty of fluids. Evaluation to date: none. Treatment to date: none.  She does not smoke.    ROS: Negative unless specifically indicated above in HPI.   Relevant past medical history reviewed and updated as indicated.   Allergies and medications reviewed and updated.   Current Outpatient Medications:    amLODipine (NORVASC) 5 MG tablet, Take 1 tablet (5 mg total) by mouth daily., Disp: 90 tablet, Rfl: 1   B Complex Vitamins (VITAMIN B COMPLEX PO), Take 1 tablet by mouth daily., Disp: , Rfl:    Cranberry 1000 MG CAPS, Take 1 capsule by mouth 2 (two) times daily., Disp: , Rfl:    Nebivolol HCl 20 MG TABS, Take 1 tablet (20 mg total) by mouth daily., Disp: 90 tablet, Rfl: 3   valsartan (DIOVAN) 320 MG tablet, Take 1 tablet (320 mg total) by mouth daily., Disp: 90 tablet, Rfl: 3    warfarin (COUMADIN) 5 MG tablet, TAKE 1 TABLET BY MOUTH DAILY EXCEPT TAKE 1/2 TABLET ON TUESDAYS, THURSDAY, SATURDAYS OR AS DIRECTED BY ANTICOAGULATION CLINIC, Disp: 100 tablet, Rfl: 1  Allergies  Allergen Reactions   Amlodipine Swelling    "my whole body swelled"   Carvedilol Swelling    "my whole body swelled"   Chlorthalidone     Patient reported extreme weakness.   Hydralazine Other (See Comments)    Joint and chest pain   Statins Other (See Comments)    Weakness, pain Lipitor and Pravachol Weakness, pain Lipitor and Pravachol   Zetia [Ezetimibe]     Unable to walk, severe leg pain   Cefdinir     On blood thinnner   Lyrica [Pregabalin] Other (See Comments)    Weakness and joint pain   Cefuroxime Axetil     REACTION: Ddoes not remember reaction   Sulfonamide Derivatives Rash    REACTION: Rash    Objective:   BP 138/74   Pulse 85   Temp 97.9 F (36.6 C)   Resp 20   Ht 5\' 3"  (1.6 m)   Wt 174 lb (78.9 kg)   SpO2 94%   BMI 30.82 kg/m    Physical Exam Vitals reviewed.  Constitutional:      General: She is not in acute distress.    Appearance: Normal appearance. She is not ill-appearing, toxic-appearing or diaphoretic.  HENT:  Head: Normocephalic and atraumatic.     Right Ear: Tympanic membrane, ear canal and external ear normal. There is no impacted cerumen.     Left Ear: Tympanic membrane, ear canal and external ear normal. There is no impacted cerumen.     Nose: Nose normal. No congestion or rhinorrhea.     Right Sinus: No maxillary sinus tenderness or frontal sinus tenderness.     Left Sinus: No maxillary sinus tenderness or frontal sinus tenderness.     Mouth/Throat:     Mouth: Mucous membranes are moist.     Pharynx: Oropharynx is clear. No oropharyngeal exudate or posterior oropharyngeal erythema.  Eyes:     General: No scleral icterus.       Right eye: No discharge.        Left eye: No discharge.     Conjunctiva/sclera: Conjunctivae normal.   Cardiovascular:     Rate and Rhythm: Normal rate and regular rhythm.     Heart sounds: Normal heart sounds. No murmur heard.    No friction rub. No gallop.  Pulmonary:     Effort: Pulmonary effort is normal. No respiratory distress.     Breath sounds: No stridor. Wheezing (throughout) present. No rhonchi or rales.  Musculoskeletal:        General: Normal range of motion.     Cervical back: Normal range of motion.  Lymphadenopathy:     Cervical: No cervical adenopathy.  Skin:    General: Skin is warm and dry.     Capillary Refill: Capillary refill takes less than 2 seconds.  Neurological:     General: No focal deficit present.     Mental Status: She is alert and oriented to person, place, and time. Mental status is at baseline.  Psychiatric:        Mood and Affect: Mood normal.        Behavior: Behavior normal.        Thought Content: Thought content normal.        Judgment: Judgment normal.

## 2023-02-19 NOTE — Telephone Encounter (Signed)
  Chief Complaint: cough Symptoms: productive cough Frequency: 2 days Pertinent Negatives: Patient denies SOB Disposition: [] ED /[] Urgent Care (no appt availability in office) / [x] Appointment(In office/virtual)/ []  [facility]/ [] Home Care/ [] Refused Recommended Disposition /[] [facility]/ []  Follow-up with PCP Additional Notes: Patient called reporting productive cough with yellow phlegm for 2 days. States that the cough is severe. Patient states she is also experiencing chest congestion. Patient is very concerned, reports she has a daughter on hospice and needs to be seen so she can go see her. Patient denies SOB, fever. Reports she is on blood thinner. Per protocol, patient to be evaluated within 24 hours. Scheduled with alternate provider in office for today at 1040. Care advice reviewed with patient, understanding verbalized. Denies further questions at this time. Alerting PCP for review.    Copied from CRM [id number]. Topic: Clinical - Red Word Triage >> Feb 19, 2023  8:10 AM Steele Sizer wrote: [facility] that prompted transfer to Nurse Triage: Pt stated that she has been coughing up cold, chest pain, and a slight headach for 3 days now . Reason for Disposition  SEVERE coughing spells (e.g., whooping sound after coughing, vomiting after coughing)  Answer Assessment - Initial Assessment Questions 1. ONSET: "When did the cough begin?"      Yesterday 2. SEVERITY: "How bad is the cough today?"      "It's bad" 3. SPUTUM: "Describe the color of your sputum" (none, dry cough; clear, white, yellow, green)     Huge hunks of yellow congestion 4. HEMOPTYSIS: "Are you coughing up any blood?" If so ask: "How much?" (flecks, streaks, tablespoons, etc.)     Denies 5. DIFFICULTY BREATHING: "Are you having difficulty breathing?" If Yes, ask: "How bad is it?" (e.g., mild, moderate, severe)    - MILD: No SOB at rest, mild SOB with walking, speaks normally in sentences, can lie down, no  retractions, pulse < 100.    - MODERATE: SOB at rest, SOB with minimal exertion and prefers to sit, cannot lie down flat, speaks in phrases, mild retractions, audible wheezing, pulse 100-120.    - SEVERE: Very SOB at rest, speaks in single words, struggling to breathe, sitting hunched forward, retractions, pulse > 120      Denies 6. FEVER: "Do you have a fever?" If Yes, ask: "What is your temperature, how was it measured, and when did it start?"     Denies 7. CARDIAC HISTORY: "Do you have any history of heart disease?" (e.g., heart attack, congestive heart failure)      Leaky heart valve 8. LUNG HISTORY: "Do you have any history of lung disease?"  (e.g., pulmonary embolus, asthma, emphysema)     Denies 9. PE RISK FACTORS: "Do you have a history of blood clots?" (or: recent major surgery, recent prolonged travel, bedridden)     Yes, on warfarin 10. OTHER SYMPTOMS: "Do you have any other symptoms?" (e.g., runny nose, wheezing, chest pain)       Chest congestion  Protocols used: Cough - Acute Productive-A-AH

## 2023-03-14 ENCOUNTER — Other Ambulatory Visit: Payer: Self-pay | Admitting: Family Medicine

## 2023-03-14 DIAGNOSIS — I10 Essential (primary) hypertension: Secondary | ICD-10-CM

## 2023-03-26 LAB — POCT INR: INR: 3.1 — AB (ref 2.0–3.0)

## 2023-03-27 ENCOUNTER — Ambulatory Visit: Payer: Medicare PPO | Admitting: Internal Medicine

## 2023-03-27 ENCOUNTER — Ambulatory Visit: Payer: Medicare PPO

## 2023-03-27 ENCOUNTER — Encounter: Payer: Self-pay | Admitting: Internal Medicine

## 2023-03-27 VITALS — BP 130/78 | HR 90 | Temp 98.2°F | Ht 63.0 in | Wt 172.0 lb

## 2023-03-27 DIAGNOSIS — D682 Hereditary deficiency of other clotting factors: Secondary | ICD-10-CM

## 2023-03-27 DIAGNOSIS — M25511 Pain in right shoulder: Secondary | ICD-10-CM | POA: Insufficient documentation

## 2023-03-27 DIAGNOSIS — Z7901 Long term (current) use of anticoagulants: Secondary | ICD-10-CM

## 2023-03-27 LAB — POCT INR: INR: 3.1 — AB (ref 2.0–3.0)

## 2023-03-27 MED ORDER — METHYLPREDNISOLONE 4 MG PO TBPK: ORAL_TABLET | ORAL | 0 refills | Status: DC

## 2023-03-27 NOTE — Assessment & Plan Note (Signed)
 On Coumadin for h/o DVT x 40 years

## 2023-03-27 NOTE — Assessment & Plan Note (Signed)
 R prox biceps and R subacr space - painful Pt declined shots Medrol pack ROM exercises, heat RTC 4 wks

## 2023-03-27 NOTE — Progress Notes (Signed)
 Subjective:  Patient ID: [name] Little, female    DOB: Feb 19, 1933  Age: 89 y.o. MRN: [id number]  CC: Medical Management of Chronic Issues (3 mnth f/u)   HPI [name] Little presents for R shoulder pain x 2-3 weeks R handed On coumadin   Outpatient Medications Prior to Visit  Medication Sig Dispense Refill   albuterol (VENTOLIN HFA) 108 (90 Base) MCG/ACT inhaler Inhale 2 puffs into the lungs every 6 (six) hours as needed. 18 g 0   amLODipine (NORVASC) 5 MG tablet TAKE 1 TABLET (5 MG TOTAL) BY MOUTH DAILY. 90 tablet 3   B Complex Vitamins (VITAMIN B COMPLEX PO) Take 1 tablet by mouth daily.     Cranberry 1000 MG CAPS Take 1 capsule by mouth 2 (two) times daily.     Nebivolol HCl 20 MG TABS Take 1 tablet (20 mg total) by mouth daily. 90 tablet 3   valsartan (DIOVAN) 320 MG tablet Take 1 tablet (320 mg total) by mouth daily. 90 tablet 3   warfarin (COUMADIN) 5 MG tablet TAKE 1 TABLET BY MOUTH DAILY EXCEPT TAKE 1/2 TABLET ON TUESDAYS, THURSDAY, SATURDAYS OR AS DIRECTED BY ANTICOAGULATION CLINIC 100 tablet 1   No facility-administered medications prior to visit.    ROS: Review of Systems  Constitutional:  Negative for activity change, appetite change, chills, fatigue and unexpected weight change.  HENT:  Negative for congestion, mouth sores and sinus pressure.   Eyes:  Negative for visual disturbance.  Respiratory:  Negative for cough and chest tightness.   Gastrointestinal:  Negative for abdominal pain and nausea.  Genitourinary:  Negative for difficulty urinating, frequency and vaginal pain.  Musculoskeletal:  Positive for arthralgias. Negative for back pain and gait problem.  Skin:  Negative for pallor and rash.  Neurological:  Negative for dizziness, tremors, weakness, numbness and headaches.  Psychiatric/Behavioral:  Negative for confusion, sleep disturbance and suicidal ideas.     Objective:  BP 130/78   Pulse 90   Temp 98.2 F (36.8 C) (Oral)   Ht 5\' 3"   (1.6 m)   Wt 172 lb (78 kg)   SpO2 94%   BMI 30.47 kg/m   BP Readings from Last 3 Encounters:  03/27/23 130/78  02/19/23 138/74  12/26/22 134/70    Wt Readings from Last 3 Encounters:  03/27/23 172 lb (78 kg)  02/19/23 174 lb (78.9 kg)  12/26/22 168 lb (76.2 kg)    Physical Exam Constitutional:      General: She is not in acute distress.    Appearance: She is well-developed. She is obese. She is not toxic-appearing.  HENT:     Head: Normocephalic.     Right Ear: External ear normal.     Left Ear: External ear normal.     Nose: Nose normal.  Eyes:     General:        Right eye: No discharge.        Left eye: No discharge.     Conjunctiva/sclera: Conjunctivae normal.     Pupils: Pupils are equal, round, and reactive to light.  Neck:     Thyroid: No thyromegaly.     Vascular: No JVD.     Trachea: No tracheal deviation.  Cardiovascular:     Rate and Rhythm: Normal rate and regular rhythm.     Heart sounds: Normal heart sounds.  Pulmonary:     Effort: No respiratory distress.     Breath sounds: No stridor. No wheezing.  Abdominal:  General: Bowel sounds are normal. There is no distension.     Palpations: Abdomen is soft. There is no mass.     Tenderness: There is no abdominal tenderness. There is no guarding or rebound.  Musculoskeletal:        General: Tenderness present.     Cervical back: Normal range of motion and neck supple. No rigidity.     Right lower leg: No edema.     Left lower leg: No edema.  Lymphadenopathy:     Cervical: No cervical adenopathy.  Skin:    Findings: No erythema or rash.  Neurological:     Cranial Nerves: No cranial nerve deficit.     Motor: No abnormal muscle tone.     Coordination: Coordination normal.     Deep Tendon Reflexes: Reflexes normal.  Psychiatric:        Behavior: Behavior normal.        Thought Content: Thought content normal.        Judgment: Judgment normal.    R prox biceps and R subacr space -  painful   Lab Results  Component Value Date   WBC 8.1 11/07/2022   HGB 14.4 11/07/2022   HCT 44.9 11/07/2022   PLT 319.0 11/07/2022   GLUCOSE 127 (H) 11/07/2022   CHOL 154 04/24/2022   TRIG 167.0 (H) 04/24/2022   HDL 34.40 (L) 04/24/2022   LDLDIRECT 144.0 02/09/2022   LDLCALC 86 04/24/2022   ALT 10 11/07/2022   AST 14 11/07/2022   NA 139 11/07/2022   K 4.4 11/07/2022   CL 106 11/07/2022   CREATININE 0.96 11/07/2022   BUN 36 (H) 11/07/2022   CO2 27 11/07/2022   TSH 3.57 04/24/2022   INR 3.1 (A) 03/27/2023   HGBA1C 6.7 (H) 04/24/2022   MICROALBUR 2.3 (H) 04/24/2022    MM 3D SCREENING MAMMOGRAM BILATERAL BREAST Result Date: 12/07/2022 CLINICAL DATA:  Screening. EXAM: DIGITAL SCREENING BILATERAL MAMMOGRAM WITH TOMOSYNTHESIS AND CAD TECHNIQUE: Bilateral screening digital craniocaudal and mediolateral oblique mammograms were obtained. Bilateral screening digital breast tomosynthesis was performed. The images were evaluated with computer-aided detection. COMPARISON:  Previous exam(s). ACR Breast Density Category b: There are scattered areas of fibroglandular density. FINDINGS: There are no findings suspicious for malignancy. IMPRESSION: No mammographic evidence of malignancy. A result letter of this screening mammogram will be mailed directly to the patient. RECOMMENDATION: Screening mammogram in one year. (Code:SM-B-01Y) BI-RADS CATEGORY  1: Negative. Electronically Signed   By: Sherian Rein M.D.   On: 12/07/2022 13:14    Assessment & Plan:   Problem List Items Addressed This Visit     Anticoagulant long-term use   On Coumadin for h/o DVT x 40 years      Factor V deficiency (HCC)   On Coumadin for h/o DVT x 40 years      Shoulder pain, right - Primary   R prox biceps and R subacr space - painful Pt declined shots Medrol pack ROM exercises, heat RTC 4 wks         Meds ordered this encounter  Medications   methylPREDNISolone (MEDROL DOSEPAK) 4 MG TBPK tablet     Sig: As directed    Dispense:  21 tablet    Refill:  0      Follow-up: Return in about 4 weeks (around 04/24/2023) for a follow-up visit.  Sonda Primes, MD

## 2023-03-27 NOTE — Patient Instructions (Addendum)
 Pre visit review using our clinic review tool, if applicable. No additional management support is needed unless otherwise documented below in the visit note.  Hold dose today and then continue 1 tablet daily except take 1/2 tablet on Tuesdays, Thursdays and Saturdays. Recheck in 4 weeks.

## 2023-03-27 NOTE — Progress Notes (Addendum)
 First INR result was put in with incorrect date so the result was updated with the correct date but pt was only tested once. Pt also has apt with PCP today. Hold dose today and then continue 1 tablet daily except take 1/2 tablet on Tuesdays, Thursdays and Saturdays. Recheck in 4 weeks.  Medical screening examination/treatment/procedure(s) were performed by non-physician practitioner and as supervising physician I was immediately available for consultation/collaboration.  I agree with above. Jacinta Shoe, MD

## 2023-03-27 NOTE — Patient Instructions (Signed)

## 2023-03-29 ENCOUNTER — Telehealth: Payer: Self-pay

## 2023-03-29 NOTE — Telephone Encounter (Signed)
 Copied from CRM [id number]. Topic: General - Other >> Mar 29, 2023 11:50 AM Kathryne Eriksson wrote: Reason for CRM: Message For Plotnikov, Georgina Quint, MD >> Mar 29, 2023 11:58 AM Kathryne Eriksson wrote: Patient's daughter "Ginger" called in stating that when Anah Billard was last seen in office on Tuesday, they asked if Plotnikov, Georgina Quint, MD  would take on the patient's husband Sheila Ocasio as a new patient. Ginger states that he agrees and just wanted to make Plotnikov, Georgina Quint, MD aware that Alfonso Shackett has Alzheimer's therefor he may tell Plotnikov, Georgina Quint, MD some things that he believes is true and they're not. Ginger also states they are aware of Thania Woodlief mental state. If there's any additional information needed or concerns, feel free to contact An [phone] / Ginger - [phone]

## 2023-04-02 NOTE — Telephone Encounter (Signed)
 Noted.  Please make sure that we have  correct paperwork on file to enable [place] contact [name] Little family members. Thanks

## 2023-04-10 DIAGNOSIS — L814 Other melanin hyperpigmentation: Secondary | ICD-10-CM | POA: Diagnosis not present

## 2023-04-10 DIAGNOSIS — D044 Carcinoma in situ of skin of scalp and neck: Secondary | ICD-10-CM | POA: Diagnosis not present

## 2023-04-10 DIAGNOSIS — L821 Other seborrheic keratosis: Secondary | ICD-10-CM | POA: Diagnosis not present

## 2023-04-10 DIAGNOSIS — L538 Other specified erythematous conditions: Secondary | ICD-10-CM | POA: Diagnosis not present

## 2023-04-10 DIAGNOSIS — L820 Inflamed seborrheic keratosis: Secondary | ICD-10-CM | POA: Diagnosis not present

## 2023-04-10 DIAGNOSIS — L905 Scar conditions and fibrosis of skin: Secondary | ICD-10-CM | POA: Diagnosis not present

## 2023-04-10 DIAGNOSIS — D225 Melanocytic nevi of trunk: Secondary | ICD-10-CM | POA: Diagnosis not present

## 2023-04-10 DIAGNOSIS — L718 Other rosacea: Secondary | ICD-10-CM | POA: Diagnosis not present

## 2023-04-10 DIAGNOSIS — L570 Actinic keratosis: Secondary | ICD-10-CM | POA: Diagnosis not present

## 2023-04-10 DIAGNOSIS — D485 Neoplasm of uncertain behavior of skin: Secondary | ICD-10-CM | POA: Diagnosis not present

## 2023-04-24 ENCOUNTER — Ambulatory Visit: Payer: Medicare PPO

## 2023-04-24 DIAGNOSIS — Z7901 Long term (current) use of anticoagulants: Secondary | ICD-10-CM

## 2023-04-24 LAB — POCT INR: INR: 2.8 (ref 2.0–3.0)

## 2023-04-24 NOTE — Patient Instructions (Addendum)
 Pre visit review using our clinic review tool, if applicable. No additional management support is needed unless otherwise documented below in the visit note.  Continue 1 tablet daily except take 1/2 tablet on Tuesdays, Thursdays and Saturdays. Recheck in 8 weeks.

## 2023-04-24 NOTE — Progress Notes (Cosign Needed Addendum)
 Continue 1 tablet daily except take 1/2 tablet on Tuesdays, Thursdays and Saturdays. Recheck in 6 weeks to coincide with PCP apt per pt request.  Medical screening examination/treatment/procedure(s) were performed by non-physician practitioner and as supervising physician I was immediately available for consultation/collaboration.  I agree with above. Jacinta Shoe, MD

## 2023-05-08 ENCOUNTER — Emergency Department (HOSPITAL_COMMUNITY)

## 2023-05-08 ENCOUNTER — Emergency Department (HOSPITAL_COMMUNITY)
Admission: EM | Admit: 2023-05-08 | Discharge: 2023-05-09 | Disposition: A | Discharge status: Home / Self Care | Attending: Emergency Medicine | Admitting: Emergency Medicine

## 2023-05-08 ENCOUNTER — Encounter (HOSPITAL_COMMUNITY): Payer: Self-pay

## 2023-05-08 ENCOUNTER — Other Ambulatory Visit: Payer: Self-pay

## 2023-05-08 DIAGNOSIS — E119 Type 2 diabetes mellitus without complications: Secondary | ICD-10-CM | POA: Diagnosis not present

## 2023-05-08 DIAGNOSIS — R600 Localized edema: Secondary | ICD-10-CM | POA: Insufficient documentation

## 2023-05-08 DIAGNOSIS — M79661 Pain in right lower leg: Secondary | ICD-10-CM | POA: Diagnosis not present

## 2023-05-08 DIAGNOSIS — Z853 Personal history of malignant neoplasm of breast: Secondary | ICD-10-CM | POA: Insufficient documentation

## 2023-05-08 DIAGNOSIS — Z79899 Other long term (current) drug therapy: Secondary | ICD-10-CM | POA: Diagnosis not present

## 2023-05-08 DIAGNOSIS — Z7901 Long term (current) use of anticoagulants: Secondary | ICD-10-CM | POA: Insufficient documentation

## 2023-05-08 DIAGNOSIS — J9811 Atelectasis: Secondary | ICD-10-CM | POA: Diagnosis not present

## 2023-05-08 DIAGNOSIS — M79604 Pain in right leg: Secondary | ICD-10-CM

## 2023-05-08 DIAGNOSIS — M7989 Other specified soft tissue disorders: Secondary | ICD-10-CM | POA: Diagnosis not present

## 2023-05-08 DIAGNOSIS — I10 Essential (primary) hypertension: Secondary | ICD-10-CM | POA: Insufficient documentation

## 2023-05-08 DIAGNOSIS — D72829 Elevated white blood cell count, unspecified: Secondary | ICD-10-CM | POA: Diagnosis not present

## 2023-05-08 DIAGNOSIS — M79605 Pain in left leg: Secondary | ICD-10-CM | POA: Diagnosis not present

## 2023-05-08 DIAGNOSIS — I517 Cardiomegaly: Secondary | ICD-10-CM | POA: Diagnosis not present

## 2023-05-08 DIAGNOSIS — I700 Atherosclerosis of aorta: Secondary | ICD-10-CM | POA: Diagnosis not present

## 2023-05-08 LAB — CBC WITH DIFFERENTIAL/PLATELET
Abs Immature Granulocytes: 0.06 10*3/uL (ref 0.00–0.07)
Basophils Absolute: 0.1 10*3/uL (ref 0.0–0.1)
Basophils Relative: 1 %
Eosinophils Absolute: 0.3 10*3/uL (ref 0.0–0.5)
Eosinophils Relative: 3 %
HCT: 43 % (ref 36.0–46.0)
Hemoglobin: 13.9 g/dL (ref 12.0–15.0)
Immature Granulocytes: 1 %
Lymphocytes Relative: 42 %
Lymphs Abs: 4.9 10*3/uL — ABNORMAL HIGH (ref 0.7–4.0)
MCH: 30.1 pg (ref 26.0–34.0)
MCHC: 32.3 g/dL (ref 30.0–36.0)
MCV: 93.1 fL (ref 80.0–100.0)
Monocytes Absolute: 1.5 10*3/uL — ABNORMAL HIGH (ref 0.1–1.0)
Monocytes Relative: 13 %
Neutro Abs: 4.6 10*3/uL (ref 1.7–7.7)
Neutrophils Relative %: 40 %
Platelets: 303 10*3/uL (ref 150–400)
RBC: 4.62 MIL/uL (ref 3.87–5.11)
RDW: 15.1 % (ref 11.5–15.5)
WBC: 11.5 10*3/uL — ABNORMAL HIGH (ref 4.0–10.5)
nRBC: 0 % (ref 0.0–0.2)

## 2023-05-08 LAB — COMPREHENSIVE METABOLIC PANEL WITH GFR
ALT: 15 U/L (ref 0–44)
AST: 25 U/L (ref 15–41)
Albumin: 3.9 g/dL (ref 3.5–5.0)
Alkaline Phosphatase: 58 U/L (ref 38–126)
Anion gap: 11 (ref 5–15)
BUN: 24 mg/dL — ABNORMAL HIGH (ref 8–23)
CO2: 21 mmol/L — ABNORMAL LOW (ref 22–32)
Calcium: 8.7 mg/dL — ABNORMAL LOW (ref 8.9–10.3)
Chloride: 105 mmol/L (ref 98–111)
Creatinine, Ser: 0.64 mg/dL (ref 0.44–1.00)
GFR, Estimated: >60 mL/min (ref >60)
Glucose, Bld: 138 mg/dL — ABNORMAL HIGH (ref 70–99)
Potassium: 3.8 mmol/L (ref 3.5–5.1)
Sodium: 137 mmol/L (ref 135–145)
Total Bilirubin: 0.7 mg/dL (ref 0.0–1.2)
Total Protein: 7.3 g/dL (ref 6.5–8.1)

## 2023-05-08 LAB — BRAIN NATRIURETIC PEPTIDE: B Natriuretic Peptide: 148.5 pg/mL — ABNORMAL HIGH (ref 0.0–100.0)

## 2023-05-08 NOTE — ED Triage Notes (Signed)
 Pt brought in d/t swelling in RLE with redness starting tonight. Swelling on opposite leg has started afterwards as well per patient. Pt reports dull ache. Denies fever. Pt awake and alert, A&Ox4, and in NAD in triage.

## 2023-05-08 NOTE — ED Provider Triage Note (Signed)
 Emergency Medicine Provider Triage Evaluation Note  [name] Little , a 89 y.o. female  was evaluated in triage.  Pt complains of bilateral pedal edema that suddenly worsened at 1730 today. Swelling started in right leg. Hx of DVT 1999 (on coumadin - been compliant)  Took one lasix 20mg  yesterday. Has it for as needed  Review of Systems  Positive: Bilateral pedal edema Negative: No CP, SHOB  Physical Exam  BP (!) 175/81 (BP Location: Left Arm)   Pulse (!) 57   Temp (!) 97.5 F (36.4 C) (Oral)   Resp 18   Ht 5\' 3"  (1.6 m)   Wt 78.5 kg   SpO2 93%   BMI 30.65 kg/m  Gen:   Awake, no distress   Resp:  Normal effort  MSK:   Moves extremities without difficulty  Other:  2+ pitting edema BLE. RLE slightly more swollen into right upper thigh. DP2+ in LLE and DP1+ in RLE  Medical Decision Making  Medically screening exam initiated at 10:00 PM.  Appropriate orders placed.  Kerstyn Coryell was informed that the remainder of the evaluation will be completed by another provider, this initial triage assessment does not replace that evaluation, and the importance of remaining in the ED until their evaluation is complete.  Labs and CXR ordered   Judithann Sheen, [place] 05/08/23 2209

## 2023-05-09 ENCOUNTER — Emergency Department (HOSPITAL_BASED_OUTPATIENT_CLINIC_OR_DEPARTMENT_OTHER)

## 2023-05-09 DIAGNOSIS — M79661 Pain in right lower leg: Secondary | ICD-10-CM | POA: Diagnosis not present

## 2023-05-09 LAB — PROTIME-INR
INR: 1.9 — ABNORMAL HIGH (ref 0.8–1.2)
Prothrombin Time: 21.7 s — ABNORMAL HIGH (ref 11.4–15.2)

## 2023-05-09 NOTE — ED Notes (Signed)
 PT REQUESTS NO TAPE COBAND ONLY PLEASE.

## 2023-05-09 NOTE — Progress Notes (Signed)
 Lower extremity venous duplex completed. Please see CV Procedures for preliminary results.  Shona Simpson, RVT 05/09/23 10:33 AM

## 2023-05-09 NOTE — ED Provider Notes (Addendum)
  Physical Exam  BP (!) 172/74   Pulse (!) 56   Temp 97.9 F (36.6 C)   Resp 17   Ht 1.6 m (5\' 3" )   Wt 78.5 kg   SpO2 94%   BMI 30.65 kg/m   Physical Exam  Procedures  Procedures  ED Course / MDM    Medical Decision Making Amount and/or Complexity of Data Reviewed Labs: ordered.  Handoff from Dr. Eudelia Bunch 89 yo ho factor 5 leiden deficiency on coumadin, leg swelling with r>left.  Pending dvt study INR 2.8, here 1.9 D/C if negative. No evidence of dvt noted on [place]. Discussed elevated bp and need for taking bp meds.  She has missed her dose today likely causing elevated bp Will d/c         Margarita Grizzle, MD 05/09/23 1117    Margarita Grizzle, MD 05/09/23 [phone]

## 2023-05-09 NOTE — ED Provider Notes (Signed)
 [place] [facility]ERGENCY DEPARTMENT AT [facility] Provider Note  CSN: [id number] Arrival date & time: 05/08/23 2117  Chief Complaint(s) Leg Swelling  HPI [name] Little is a 89 y.o. female with a past medical history listed below including factor V Leyden on chronic Coumadin with therapeutic INR on 3/25, history of prior DVTs and PEs, venous insufficiency, and grade 1 diastolic dysfunction noted on echo performed in January 2023 here for bilateral lower extremity edema right greater than left with associated calf pain on the right.  Patient has hyperemia on bilateral legs but again will right greater than left.  Pain and swelling began 2 days ago.  Patient reports taking a dose of Lasix without improvement once on Monday (2 days ago).  Did not take any Lasix yesterday.  Denies any fall or trauma.  Patient denies any recent travel or prolonged immobility.  Reports that she is very active.  She denies any dyspnea on exertion.  No chest pain.  No orthopnea.  The history is provided by the patient.    Past Medical History Past Medical History:  Diagnosis Date   Anticoagulated on Coumadin    long-term for factor V---  managed by coumadin clinic and pcp   Diet-controlled type 2 diabetes mellitus (HCC)    followed by pcp   Factor V deficiency (HCC) 1998   anticoagulated on coumadin   GAD (generalized anxiety disorder)    Heart murmur    Hemorrhoids    History of avascular necrosis of capital femoral epiphysis 1999   s/p THA right   History of basal cell carcinoma (BCC) excision    scalp/ neck   History of Clostridium difficile infection    History of DVT of lower extremity 1998;  04/ 2006   LLE   History of external beam radiation therapy 2010   right breast cancer   History of ITP    per pt hx long-term use prednisone for ITP,  1998 s/p total splenctomy   History of kidney stones    History of melanoma 1980s   s/p  right calf melanoma excision , per pt not malignant and  localized ,  no recurrence   History of paroxysmal supraventricular tachycardia    (02-23-2020  pt stated have not had any issues / symptoms in several years   History of pulmonary embolus (PE) 04/2004   History of right breast cancer 2010   08-10-2008 s/p  right lumpectomy w/ sln bx (partial mastectomy) , ER+, Stage I,  completed radiation 2010,  per pt no recurrance   Hypertension    Macular degeneration, bilateral    mild   Mitral valve regurgitation 09/01/2016   cardiology-- dr Eden Emms--- per note mild to moderate without stenosis,     Mixed hyperlipidemia 05/18/2015   Nocturia    OA (osteoarthritis)    lumbar stenosis, radiculopathy, OA- L hip   Personal history of colonic polyps    hyperplastic   Renal calculus, right    Right ureteral calculus    Thoracic ascending aortic aneurysm (HCC)    last CT angio chest in epic 11-29-2016  , 3.7cm   Patient Active Problem List   Diagnosis Date Noted   Shoulder pain, right 03/27/2023   Vaginitis 12/26/2022   Varicose veins of left lower leg 11/23/2022   Cellulitis of left lower extremity 11/23/2022   Rectal bleeding 11/09/2022   Acute lower GI bleeding 11/03/2022   Acute bronchitis due to COVID-19 virus 10/10/2021   Statin intolerance 07/17/2021  Urinary tract infection without hematuria 04/05/2021   Toenail bruise, unspecified laterality, subsequent encounter 06/23/2020   Ureteral stone with hydronephrosis 01/27/2020   Urinary tract infection with hematuria 12/19/2019   Acute bilateral back pain 05/27/2019   Other fatigue 05/27/2019   Factor V deficiency (HCC) 03/11/2018   Hematuria 01/29/2018   Hand injury 10/27/2017   Laceration of right hand with infection 10/26/2017   Kidney stone 09/17/2017   Aortic atherosclerosis (HCC) 08/18/2017   Mitral valve regurgitation 09/01/2016   History of UTI 08/22/2016   Lower extremity edema 08/22/2016   BCC (basal cell carcinoma), scalp/neck 09/27/2015   Pain in limb 05/30/2015   Low  back pain 03/26/2014   Type 2 diabetes mellitus with obesity (HCC) 02/26/2014   Nocturia 02/26/2014   Obesity (BMI 30-39.9) 02/03/2014   Anxiety state 09/22/2013   FH: factor V Leiden mutation 08/19/2013   Preventative health care 04/02/2013   Encounter for therapeutic drug monitoring 03/26/2013   Anticoagulant long-term use 08/23/2010   CALLUS, TOE 01/05/2010   CHEST PAIN 08/23/2009   Malignant neoplasm of female breast (HCC) 07/27/2009   FACTOR V DEFICIENCY 07/27/2009   Thrombophlebitis of superficial veins of left lower extremity 12/11/2007   HEMORRHOIDS 12/09/2007   History of colonic polyps 12/09/2007   Essential hypertension 09/18/2007   Venous (peripheral) insufficiency 07/02/2007   Hyperlipidemia 06/04/2007   Paroxysmal supraventricular tachycardia (HCC) 03/28/2007   Aneurysm of thoracic aorta (HCC) 11/25/2006   DVT, HX OF 08/11/2006   Home Medication(s) Prior to Admission medications   Medication Sig Start Date End Date Taking? Authorizing Provider  albuterol (VENTOLIN HFA) 108 (90 Base) MCG/ACT inhaler Inhale 2 puffs into the lungs every 6 (six) hours as needed. 02/19/23  Yes Deliah Boston F, FNP  amLODipine (NORVASC) 5 MG tablet TAKE 1 TABLET (5 MG TOTAL) BY MOUTH DAILY. 03/15/23  Yes Plotnikov, Georgina Quint, MD  B Complex Vitamins (VITAMIN B COMPLEX PO) Take 1 tablet by mouth daily.   Yes [provider]  Cranberry 1000 MG CAPS Take 1 capsule by mouth 2 (two) times daily.   Yes [provider]  metroNIDAZOLE (METROCREAM) 0.75 % cream Apply 1 Application topically daily as needed (Irritation). 04/10/23  Yes [provider]  Nebivolol HCl 20 MG TABS Take 1 tablet (20 mg total) by mouth daily. 06/06/22  Yes Seabron Spates R, DO  valsartan (DIOVAN) 320 MG tablet Take 1 tablet (320 mg total) by mouth daily. 05/15/22  Yes Seabron Spates R, DO  warfarin (COUMADIN) 5 MG tablet TAKE 1 TABLET BY MOUTH DAILY EXCEPT TAKE 1/2 TABLET ON TUESDAYS, THURSDAY,  SATURDAYS OR AS DIRECTED BY ANTICOAGULATION CLINIC 09/21/22  Yes Seabron Spates R, DO  methylPREDNISolone (MEDROL DOSEPAK) 4 MG TBPK tablet As directed Patient not taking: Reported on 05/09/2023 03/27/23   Plotnikov, Georgina Quint, MD  Allergies Amlodipine, Carvedilol, Chlorthalidone, Hydralazine, Statins, Zetia [ezetimibe], Cefdinir, Lyrica [pregabalin], Cefuroxime axetil, and Sulfonamide derivatives  Review of Systems Review of Systems As noted in HPI  Physical Exam Vital Signs  I have reviewed the triage vital signs BP (!) 172/74   Pulse (!) 56   Temp 97.9 F (36.6 C)   Resp 17   Ht 5\' 3"  (1.6 m)   Wt 78.5 kg   SpO2 94%   BMI 30.65 kg/m   Physical Exam Vitals reviewed.  Constitutional:      General: She is not in acute distress.    Appearance: She is well-developed. She is not diaphoretic.  HENT:     Head: Normocephalic and atraumatic.     Right Ear: External ear normal.     Left Ear: External ear normal.     Nose: Nose normal.  Eyes:     General: No scleral icterus.    Conjunctiva/sclera: Conjunctivae normal.  Neck:     Trachea: Phonation normal.  Cardiovascular:     Rate and Rhythm: Normal rate and regular rhythm.  Pulmonary:     Effort: Pulmonary effort is normal. No respiratory distress.     Breath sounds: No stridor.  Abdominal:     General: There is no distension.  Musculoskeletal:        General: Normal range of motion.     Cervical back: Normal range of motion.     Right lower leg: Tenderness (calf) present. 1+ Edema present.     Left lower leg: Edema (trace) present.  Neurological:     Mental Status: She is alert and oriented to person, place, and time.  Psychiatric:        Behavior: Behavior normal.     ED Results and Treatments Labs (all labs ordered are listed, but only abnormal results are displayed) Labs Reviewed   CBC WITH DIFFERENTIAL/PLATELET - Abnormal; Notable for the following components:      Result Value   WBC 11.5 (*)    Lymphs Abs 4.9 (*)    Monocytes Absolute 1.5 (*)    All other components within normal limits  COMPREHENSIVE METABOLIC PANEL WITH GFR - Abnormal; Notable for the following components:   CO2 21 (*)    Glucose, Bld 138 (*)    BUN 24 (*)    Calcium 8.7 (*)    All other components within normal limits  BRAIN NATRIURETIC PEPTIDE - Abnormal; Notable for the following components:   B Natriuretic Peptide 148.5 (*)    All other components within normal limits  PROTIME-INR - Abnormal; Notable for the following components:   Prothrombin Time 21.7 (*)    INR 1.9 (*)    All other components within normal limits                                                                                                                         EKG  EKG Interpretation Date/Time:    Ventricular Rate:  PR Interval:    QRS Duration:    QT Interval:    QTC Calculation:   R Axis:      Text Interpretation:         Radiology DG Chest 2 View Result Date: 05/09/2023 CLINICAL DATA:  Lower extremity swelling and redness EXAM: CHEST - 2 VIEW COMPARISON:  08/02/2022 FINDINGS: Mild cardiomegaly with aortic atherosclerosis. Patchy atelectasis left lung base. No significant pleural effusion. IMPRESSION: Mild cardiomegaly. Patchy atelectasis left lung base. Electronically Signed   By: Jasmine Pang M.D.   On: 05/09/2023 00:48    Medications Ordered in ED Medications - No data to display Procedures Procedures  (including critical care time) Medical Decision Making / ED Course   Medical Decision Making Amount and/or Complexity of Data Reviewed Labs: ordered. Decision-making details documented in ED Course. Radiology: ordered and independent interpretation performed. Decision-making details documented in ED Course.    Patient presents with bilateral lower extremity edema, right greater than  left.  Right calf pain. Presentation and exam not concerning for infectious etiology. Given history of factor V Leyden, DVT is concern.  Also considering superficial thrombophlebitis but no obvious palpable cord.  Also considering mild diastolic heart dysfunction. Patient does have mild leukocytosis.  No anemia.  Metabolic panel without significant electrolyte derangements or renal sufficiency. BNP is 148. INR 1.9 Chest x-ray with mild cardiomegaly.  No overt pulmonary edema.  No pneumonia.  No pleural effusions.  DVT US ordered.  Patient care turned over to oncoming provider. Patient case and results discussed in detail; please see their note for further ED managment.       Final Clinical Impression(s) / ED Diagnoses Final diagnoses:  None    This chart was dictated using voice recognition software.  Despite best efforts to proofread,  errors can occur which can change the documentation meaning.    Nira Conn, MD 05/09/23 [phone]

## 2023-05-09 NOTE — Discharge Instructions (Addendum)
 There is no evidence of blood clot seen on your studies today. Please elevate your legs as needed at home Follow-up with your doctor in the next 1 to 2 days Return if you are having worsening symptoms Take lasix 20 mg once a day for the next 3 days and follow up with doctor Use compression socks as discussed

## 2023-05-10 ENCOUNTER — Telehealth: Payer: Self-pay | Admitting: Cardiovascular Disease

## 2023-05-10 DIAGNOSIS — I10 Essential (primary) hypertension: Secondary | ICD-10-CM

## 2023-05-10 MED ORDER — FUROSEMIDE 20 MG PO TABS: 20.0000 mg | ORAL_TABLET | Freq: Every day | ORAL | 3 refills | Status: DC

## 2023-05-10 NOTE — Telephone Encounter (Signed)
 Pt c/o swelling/edema: STAT if pt has developed SOB within 24 hours  If swelling, where is the swelling located? Legs/feet, rt is worse  How much weight have you gained and in what time span? Not sure  Have you gained 2 pounds in a day or 5 pounds in a week? Not sure  Do you have a log of your daily weights (if so, list)? no  Are you currently taking a fluid pill? yes  Are you currently SOB? No   Have you traveled recently in a car or plane for an extended period of time? No (Pt was in hospital for this 4/8-4/9)

## 2023-05-10 NOTE — Telephone Encounter (Signed)
 Wendall Stade, MD to Me  Plotnikov, Georgina Quint, MD 05/10/23  1:24 PM EF is normal, on anticoagulation, and [place] negative for recurrent DVT She has LE venous insufficiency can call in lasix 20 mg daily and f/u BMET in 4 weeks Can f/u with her primary plotnicov Recent addition of norvasc for her BP may have made her LE edema worse. BNP was normal no CHF.   Called patient back to let her know of Dr. Fabio Bering advisement. Patient will start taking lasix 20 mg daily and will get lab work in 4 weeks. Patient will talk to PCP about changing her norvasc. Patient canceled DOD appointment on Monday and will call back if she needs any additional advisement.

## 2023-05-10 NOTE — Telephone Encounter (Signed)
 Called patient back about her message. Patient stated she was told after getting discharge from the hospital that she needed to follow-up with cardiology in 1 to 2 days. Patient stated she still has swelling in her RLE and some in her LLE. Made an appointment with DOD on Monday. Will see if Dr. Eden Emms can review [facility] notes.

## 2023-05-11 ENCOUNTER — Ambulatory Visit: Admitting: Family

## 2023-05-11 ENCOUNTER — Telehealth: Payer: Self-pay

## 2023-05-11 ENCOUNTER — Encounter: Payer: Self-pay | Admitting: Family

## 2023-05-11 ENCOUNTER — Ambulatory Visit

## 2023-05-11 VITALS — BP 154/68 | HR 90 | Temp 98.4°F | Ht 63.0 in | Wt 173.0 lb

## 2023-05-11 DIAGNOSIS — I10 Essential (primary) hypertension: Secondary | ICD-10-CM

## 2023-05-11 DIAGNOSIS — R600 Localized edema: Secondary | ICD-10-CM

## 2023-05-11 DIAGNOSIS — D682 Hereditary deficiency of other clotting factors: Secondary | ICD-10-CM | POA: Diagnosis not present

## 2023-05-11 DIAGNOSIS — Z7901 Long term (current) use of anticoagulants: Secondary | ICD-10-CM | POA: Diagnosis not present

## 2023-05-11 NOTE — Progress Notes (Signed)
 Acute Office Visit  Subjective:     Patient ID: [name] Little, female    DOB: [birth year], 89 y.o.   MRN: [id number]  Chief Complaint  Patient presents with   Hospitalization Follow-up    Patient was in the ED on 05/08/23 for Right leg swelling, she does say its a lot better at the time pt was not able to walk. She was told to her lasik every morning and has been. Patient mentions that her cardiologist told her to stop the amlodipine  and notify [facility]. She does not know why...     HPI Patient is in today for hospital follow-up from May 08, 2023 where she presented with right leg swelling.  Patient reports that the swelling is a lot better at the time she was unable to walk.  She is currently taking Lasix  and was told to discontinue amlodipine .  She continues to take Diovan  320 mg and nebivolol  20 mg daily.  Tolerating it well.  BMP at the hospital was 148.  INR was 1.9.  Admits to missing a dose.  Otherwise, today she is doing well.  Review of Systems  Cardiovascular:  Positive for leg swelling.       Improved leg swelling  All other systems reviewed and are negative.  Past Medical History:  Diagnosis Date   Anticoagulated on Coumadin     long-term for factor V---  managed by coumadin  clinic and pcp   Diet-controlled type 2 diabetes mellitus (HCC)    followed by pcp   Factor V deficiency (HCC) 1998   anticoagulated on coumadin    GAD (generalized anxiety disorder)    Heart murmur    Hemorrhoids    History of avascular necrosis of capital femoral epiphysis 1999   s/p THA right   History of basal cell carcinoma (BCC) excision    scalp/ neck   History of Clostridium difficile infection    History of DVT of lower extremity 1998;  04/ 2006   LLE   History of external beam radiation therapy 2010   right breast cancer   History of ITP    per pt hx long-term use prednisone  for ITP,  1998 s/p total splenctomy   History of kidney stones    History of melanoma 1980s   s/p   right calf melanoma excision , per pt not malignant and localized ,  no recurrence   History of paroxysmal supraventricular tachycardia    (02-23-2020  pt stated have not had any issues / symptoms in several years   History of pulmonary embolus (PE) 04/2004   History of right breast cancer 2010   08-10-2008 s/p  right lumpectomy w/ sln bx (partial mastectomy) , ER+, Stage I,  completed radiation 2010,  per pt no recurrance   Hypertension    Macular degeneration, bilateral    mild   Mitral valve regurgitation 09/01/2016   cardiology-- dr Stann Earnest--- per note mild to moderate without stenosis,     Mixed hyperlipidemia 05/18/2015   Nocturia    OA (osteoarthritis)    lumbar stenosis, radiculopathy, OA- L hip   Personal history of colonic polyps    hyperplastic   Renal calculus, right    Right ureteral calculus    Thoracic ascending aortic aneurysm (HCC)    last CT angio chest in epic 11-29-2016  , 3.7cm    Social History   Socioeconomic History   Marital status: Married    Spouse name: Not on file   Number of children: 4  Years of education: Not on file   Highest education level: Not on file  Occupational History   Occupation: Retired  Tobacco Use   Smoking status: Never   Smokeless tobacco: Never  Vaping Use   Vaping status: Never Used  Substance and Sexual Activity   Alcohol use: No    Alcohol/week: 0.0 standard drinks of alcohol   Drug use: Never   Sexual activity: Not on file  Other Topics Concern   Not on file  Social History Narrative   Married 1955   2 sons - '59, '61, 2 daughters '55, '57; 8 grandchildren; 1 great grand   I-ADLs      Social Drivers of Corporate investment banker Strain: Not on file  Food Insecurity: No Food Insecurity (11/05/2022)   Hunger Vital Sign    Worried About Running Out of Food in the Last Year: Never true    Ran Out of Food in the Last Year: Never true  Transportation Needs: No Transportation Needs (11/05/2022)   PRAPARE -  Administrator, Civil Service (Medical): No    Lack of Transportation (Non-Medical): No  Physical Activity: Not on file  Stress: Not on file  Social Connections: Not on file  Intimate Partner Violence: Not At Risk (11/05/2022)   Humiliation, Afraid, Rape, and Kick questionnaire    Fear of Current or Ex-Partner: No    Emotionally Abused: No    Physically Abused: No    Sexually Abused: No    Past Surgical History:  Procedure Laterality Date   ABDOMINAL HYSTERECTOMY  1973   BREAST LUMPECTOMY WITH RADIOACTIVE SEED AND SENTINEL LYMPH NODE BIOPSY Right 08/10/2008   @[facility]    CATARACT EXTRACTION W/ INTRAOCULAR LENS  IMPLANT, BILATERAL Bilateral 2001   CYSTOSCOPY WITH RETROGRADE PYELOGRAM, URETEROSCOPY AND STENT PLACEMENT Right 02/25/2020   Procedure: CYSTOSCOPY WITH RETROGRADE PYELOGRAM, URETEROSCOPY AND STENT EXCHANGE;  Surgeon: Osborn Blaze, MD;  Location: [facility];  Service: Urology;  Laterality: Right;  75 MINS   CYSTOSCOPY WITH STENT PLACEMENT Right 01/27/2020   Procedure: CYSTOSCOPY WITH STENT PLACEMENT;  Surgeon: Osborn Blaze, MD;  Location: [facility];  Service: Urology;  Laterality: Right;   CYSTOSCOPY/URETEROSCOPY/HOLMIUM LASER/STENT PLACEMENT Left 06-27-2018  dr Luster Salters,  @[facility]    HOLMIUM LASER APPLICATION Right 02/25/2020   Procedure: HOLMIUM LASER APPLICATION;  Surgeon: Osborn Blaze, MD;  Location: [facility];  Service: Urology;  Laterality: Right;   I & D EXTREMITY Right 10/26/2017   Procedure: IRRIGATION AND DEBRIDEMENT RIGHT HAND;  Surgeon: Arvil Birks, MD;  Location: [facility];  Service: Orthopedics;  Laterality: Right;   LUMBAR LAMINECTOMY/DECOMPRESSION MICRODISCECTOMY  04/11/2011   Procedure: LUMBAR LAMINECTOMY/DECOMPRESSION MICRODISCECTOMY;  Surgeon: Elna Haggis, MD;  Location: [facility];  Service: Neurosurgery;  Laterality: N/A;  Lumbar Five-Sacral One Microdiscectomy   MELANOMA EXCISION Right 1980s   right calf    SPLENECTOMY, TOTAL  1998   TOTAL HIP ARTHROPLASTY Right 1999   UPPER GASTROINTESTINAL ENDOSCOPY      Family History  Problem Relation Age of Onset   Heart disease Mother    Heart disease Father    Alzheimer's disease Sister    Dementia Sister    Dementia Sister    Stroke Sister    Dementia Sister        mean, carotid artery disease   Clotting disorder Maternal Uncle    Breast cancer Paternal Aunt    Cancer Paternal Aunt        breast  Heart disease Other        uncles   Colon cancer Neg Hx    Anesthesia problems Neg Hx    Hypotension Neg Hx    Malignant hyperthermia Neg Hx    Pseudochol deficiency Neg Hx    Esophageal cancer Neg Hx    Rectal cancer Neg Hx    Stomach cancer Neg Hx     Allergies  Allergen Reactions   Amlodipine  Swelling    my whole body swelled   Carvedilol  Swelling    my whole body swelled   Chlorthalidone      Patient reported extreme weakness.   Hydralazine  Other (See Comments)    Joint and chest pain   Statins Other (See Comments)    Weakness, pain Lipitor and Pravachol Weakness, pain Lipitor and Pravachol   Zetia  [Ezetimibe ]     Unable to walk, severe leg pain   Cefdinir     On blood thinnner   Lyrica  [Pregabalin ] Other (See Comments)    Weakness and joint pain   Cefuroxime Axetil     REACTION: Ddoes not remember reaction   Sulfonamide Derivatives Rash    REACTION: Rash    Current Outpatient Medications on File Prior to Visit  Medication Sig Dispense Refill   albuterol  (VENTOLIN  HFA) 108 (90 Base) MCG/ACT inhaler Inhale 2 puffs into the lungs every 6 (six) hours as needed. 18 g 0   B Complex Vitamins (VITAMIN B COMPLEX PO) Take 1 tablet by mouth daily.     Cranberry 1000 MG CAPS Take 1 capsule by mouth 2 (two) times daily.     furosemide  (LASIX ) 20 MG tablet Take 1 tablet (20 mg total) by mouth daily. 90 tablet 3   metroNIDAZOLE  (METROCREAM ) 0.75 % cream Apply 1 Application topically daily as needed (Irritation).     Nebivolol   HCl 20 MG TABS Take 1 tablet (20 mg total) by mouth daily. 90 tablet 3   valsartan  (DIOVAN ) 320 MG tablet Take 1 tablet (320 mg total) by mouth daily. 90 tablet 3   warfarin (COUMADIN ) 5 MG tablet TAKE 1 TABLET BY MOUTH DAILY EXCEPT TAKE 1/2 TABLET ON TUESDAYS, THURSDAY, SATURDAYS OR AS DIRECTED BY ANTICOAGULATION CLINIC 100 tablet 1   methylPREDNISolone  (MEDROL  DOSEPAK) 4 MG TBPK tablet As directed (Patient not taking: Reported on 05/09/2023) 21 tablet 0   No current facility-administered medications on file prior to visit.    BP (!) 154/68 (BP Location: Left Arm, Patient Position: Sitting, Cuff Size: Normal)   Pulse 90   Temp 98.4 F (36.9 C) (Oral)   Ht 5' 3 (1.6 m)   Wt 173 lb (78.5 kg)   SpO2 97%   BMI 30.65 kg/m chart     Objective:    BP (!) 154/68 (BP Location: Left Arm, Patient Position: Sitting, Cuff Size: Normal)   Pulse 90   Temp 98.4 F (36.9 C) (Oral)   Ht 5' 3 (1.6 m)   Wt 173 lb (78.5 kg)   SpO2 97%   BMI 30.65 kg/m    Physical Exam Vitals and nursing note reviewed.  Constitutional:      Appearance: Normal appearance.  Cardiovascular:     Rate and Rhythm: Normal rate and regular rhythm.  Pulmonary:     Effort: Pulmonary effort is normal.     Breath sounds: Normal breath sounds.  Abdominal:     General: Abdomen is flat. Bowel sounds are normal.     Palpations: Abdomen is soft.  Musculoskeletal:  General: Normal range of motion.     Comments: Mild edema 1+ bilaterally.  No erythema.  Skin:    General: Skin is warm and dry.  Neurological:     General: No focal deficit present.     Mental Status: She is alert and oriented to person, place, and time. Mental status is at baseline.  Psychiatric:        Mood and Affect: Mood normal.        Behavior: Behavior normal.   No results found for any visits on 05/11/23.      Assessment & Plan:   Problem List Items Addressed This Visit     Lower extremity edema   Relevant Orders   Brain  natriuretic peptide   POCT INR   CBC w/Diff   Comprehensive metabolic panel with GFR   Factor V deficiency (HCC)   Relevant Orders   Brain natriuretic peptide   POCT INR   CBC w/Diff   Comprehensive metabolic panel with GFR   Essential hypertension - Primary   Relevant Orders   Brain natriuretic peptide   POCT INR   CBC w/Diff   Comprehensive metabolic panel with GFR   Other Visit Diagnoses       Long term (current) use of anticoagulants [Z79.01]       Relevant Orders   Brain natriuretic peptide   POCT INR   CBC w/Diff   Comprehensive metabolic panel with GFR       No orders of the defined types were placed in this encounter. I suspect that patient will need another blood pressure agent.  However, she would like to see how her blood pressure does over the next couple weeks.  She has agreed to call us  back with blood pressure readings over the next 7 to 10 days.  Low-sodium diet, however she denies any increased salt intake.  Call the office if symptoms worsen or persist.  Recheck as scheduled and sooner as needed.  No follow-ups on file.  Lashayla Armes B Donaven Criswell, FNP

## 2023-05-11 NOTE — Patient Instructions (Signed)
 Monitor your blood pressure over the next 7 to 10 days.  Call us with the blood pressure readings.  We may have to add an additional blood pressure medication to ensure that your blood pressure is adequately controlled.  In the meantime, low-sodium diet.  Follow-up as scheduled.

## 2023-05-11 NOTE — Telephone Encounter (Signed)
 Pt requested to speak to coumadin nurse after he OV today for hospital f/u.   Pt reports the provider she saw ordered labs but she would rather wait until she has apt with PCP so she is not having labs drawn today.  Advised pt her apt with PCP is not until 5/20 and she should be seen before then. Changed her PCP apt to next week and also coumadin clinic apt. Advised if any changes to contact the office or go to UC or ER. Pt verbalized understanding.

## 2023-05-14 ENCOUNTER — Ambulatory Visit: Admitting: Cardiology

## 2023-05-22 ENCOUNTER — Telehealth: Payer: Self-pay

## 2023-05-22 ENCOUNTER — Ambulatory Visit

## 2023-05-22 ENCOUNTER — Ambulatory Visit: Admitting: Internal Medicine

## 2023-05-22 ENCOUNTER — Encounter: Payer: Self-pay | Admitting: Internal Medicine

## 2023-05-22 VITALS — BP 122/84 | HR 68 | Temp 97.9°F | Ht 63.0 in | Wt 172.8 lb

## 2023-05-22 DIAGNOSIS — Z7901 Long term (current) use of anticoagulants: Secondary | ICD-10-CM

## 2023-05-22 DIAGNOSIS — I10 Essential (primary) hypertension: Secondary | ICD-10-CM | POA: Diagnosis not present

## 2023-05-22 DIAGNOSIS — I872 Venous insufficiency (chronic) (peripheral): Secondary | ICD-10-CM | POA: Diagnosis not present

## 2023-05-22 DIAGNOSIS — M25561 Pain in right knee: Secondary | ICD-10-CM | POA: Insufficient documentation

## 2023-05-22 DIAGNOSIS — Z87440 Personal history of urinary (tract) infections: Secondary | ICD-10-CM | POA: Diagnosis not present

## 2023-05-22 DIAGNOSIS — G8929 Other chronic pain: Secondary | ICD-10-CM

## 2023-05-22 DIAGNOSIS — F411 Generalized anxiety disorder: Secondary | ICD-10-CM | POA: Diagnosis not present

## 2023-05-22 DIAGNOSIS — R609 Edema, unspecified: Secondary | ICD-10-CM | POA: Insufficient documentation

## 2023-05-22 LAB — POCT INR: INR: 2.5 (ref 2.0–3.0)

## 2023-05-22 NOTE — Assessment & Plan Note (Signed)
 On Amlodipine

## 2023-05-22 NOTE — Patient Instructions (Addendum)
 Pre visit review using our clinic review tool, if applicable. No additional management support is needed unless otherwise documented below in the visit note.  Continue 1 tablet daily except take 1/2 tablet on Tuesdays, Thursdays and Saturdays. Recheck in 3 weeks.

## 2023-05-22 NOTE — Telephone Encounter (Signed)
 Has been addressed

## 2023-05-22 NOTE — Patient Instructions (Signed)
 USEFUL THINGS FOR ARTHRITIS and musculoskeletal pains:    A "rice sock heating pad" refers to a homemade heating pad created by filling a sock with uncooked rice, which can be heated in a microwave to provide a warm compress for sore muscles, pain relief, or other applications; essentially, it's a simple way to generate heat using readily available materials.  Key points about rice sock heat: How to make it: Fill a clean sock (preferably a tube sock) about 2/3 full with uncooked rice, tie a knot at the top to secure the rice inside.  Heating it up: Place the rice sock in the microwave and heat in short intervals (usually around 30 seconds at a time) until it reaches the desired warmth.  Important considerations: Check temperature before applying: Always test the temperature of the rice sock before applying it to your skin to avoid burns.  Use a towel to protect skin: Wrap the rice sock in a thin towel to distribute the heat evenly and protect your skin.  Uses: Muscle aches and pains  Menstrual cramps  Neck pain  Arthritis discomfort      BLUE EMU CREAM: Use it 2-3 times a day on painful areas

## 2023-05-22 NOTE — Assessment & Plan Note (Signed)
 Check UA

## 2023-05-22 NOTE — Assessment & Plan Note (Signed)
 Use compression socks Lasix  prn

## 2023-05-22 NOTE — Assessment & Plan Note (Addendum)
 Husband 90 w/dementia Not on Rx

## 2023-05-22 NOTE — Assessment & Plan Note (Signed)
Blue-Emu cream was recommended to use 2-3 times a day ? ?

## 2023-05-22 NOTE — Assessment & Plan Note (Signed)
 On Coumadin for h/o DVT x 40 years

## 2023-05-22 NOTE — Progress Notes (Addendum)
 Pt also had PCP apt today. Pt in ER on 4/8 for bilateral lower leg pain, cause was edema. Continue 1 tablet daily except take 1/2 tablet on Tuesdays, Thursdays and Saturdays. Recheck in 3 weeks.  Medical screening examination/treatment/procedure(s) were performed by non-physician practitioner and as supervising physician I was immediately available for consultation/collaboration.  I agree with above. Adelaide Holy, MD

## 2023-05-22 NOTE — Assessment & Plan Note (Signed)
On Lasix °

## 2023-05-22 NOTE — Telephone Encounter (Signed)
 Pt called to request her husband receive his B12 injection at her apt with PCP on 5/20. Advised that apt was changed to today per her request last week. Advised her apt with PCP is today at 11:20 and it is ok if she sees coumadin  clinic after her apt with PCP. Pt said she thinks she can get here in time for her PCP apt.   Pt requested her husband, Michel Eskelson, DOB 12/22/26, receive his B12 injection while at this apt.  Advised a msg would be sent to CMA with request. Pt verbalized understanding and was appreciative.    Review of pt's husbands chart reports last B12 injection was 3/20 at Dr. Wendall Halls office.

## 2023-05-22 NOTE — Progress Notes (Signed)
 Subjective:  Patient ID: [name] Little, female    DOB: 1933-11-10  Age: 89 y.o. MRN: [id number]  CC: Medical Management of Chronic Issues (3 Month Follow Up. Patient notes some fluid present in right knee, to which they believe is from one of their medications)   HPI [name] Little presents for R knee pain, B LE edema R>L, HTN   Outpatient Medications Prior to Visit  Medication Sig Dispense Refill  . albuterol  (VENTOLIN  HFA) 108 (90 Base) MCG/ACT inhaler Inhale 2 puffs into the lungs every 6 (six) hours as needed. 18 g 0  . B Complex Vitamins (VITAMIN B COMPLEX PO) Take 1 tablet by mouth daily.    . Cranberry 1000 MG CAPS Take 1 capsule by mouth 2 (two) times daily.    . furosemide  (LASIX ) 20 MG tablet Take 1 tablet (20 mg total) by mouth daily. 90 tablet 3  . metroNIDAZOLE  (METROCREAM ) 0.75 % cream Apply 1 Application topically daily as needed (Irritation).    . Nebivolol  HCl 20 MG TABS Take 1 tablet (20 mg total) by mouth daily. 90 tablet 3  . valsartan  (DIOVAN ) 320 MG tablet Take 1 tablet (320 mg total) by mouth daily. 90 tablet 3  . warfarin (COUMADIN ) 5 MG tablet TAKE 1 TABLET BY MOUTH DAILY EXCEPT TAKE 1/2 TABLET ON TUESDAYS, THURSDAY, SATURDAYS OR AS DIRECTED BY ANTICOAGULATION CLINIC 100 tablet 1  . methylPREDNISolone  (MEDROL  DOSEPAK) 4 MG TBPK tablet As directed (Patient not taking: Reported on 05/09/2023) 21 tablet 0   No facility-administered medications prior to visit.    ROS: Review of Systems  Constitutional:  Negative for activity change, appetite change, chills, fatigue and unexpected weight change.  HENT:  Negative for congestion, mouth sores and sinus pressure.   Eyes:  Negative for visual disturbance.  Respiratory:  Negative for cough and chest tightness.   Gastrointestinal:  Negative for abdominal pain and nausea.  Genitourinary:  Negative for difficulty urinating, frequency and vaginal pain.  Musculoskeletal:  Positive for arthralgias and gait  problem. Negative for back pain.  Skin:  Negative for pallor and rash.  Neurological:  Negative for dizziness, tremors, weakness, numbness and headaches.  Psychiatric/Behavioral:  Negative for confusion and sleep disturbance. The patient is not nervous/anxious.     Objective:  BP 122/84   Pulse 68   Temp 97.9 F (36.6 C)   Ht 5\' 3"  (1.6 m)   Wt 172 lb 12.8 oz (78.4 kg)   SpO2 94%   BMI 30.61 kg/m   BP Readings from Last 3 Encounters:  05/22/23 122/84  05/11/23 (!) 154/68  05/09/23 (!) 198/83    Wt Readings from Last 3 Encounters:  05/22/23 172 lb 12.8 oz (78.4 kg)  05/11/23 173 lb (78.5 kg)  05/08/23 173 lb (78.5 kg)    Physical Exam Constitutional:      General: She is not in acute distress.    Appearance: She is well-developed. She is obese.  HENT:     Head: Normocephalic.     Right Ear: External ear normal.     Left Ear: External ear normal.     Nose: Nose normal.  Eyes:     General:        Right eye: No discharge.        Left eye: No discharge.     Conjunctiva/sclera: Conjunctivae normal.     Pupils: Pupils are equal, round, and reactive to light.  Neck:     Thyroid : No thyromegaly.  Vascular: No JVD.     Trachea: No tracheal deviation.  Cardiovascular:     Rate and Rhythm: Normal rate and regular rhythm.     Heart sounds: Normal heart sounds.  Pulmonary:     Effort: No respiratory distress.     Breath sounds: No stridor. No wheezing.  Abdominal:     General: Bowel sounds are normal. There is no distension.     Palpations: Abdomen is soft. There is no mass.     Tenderness: There is no abdominal tenderness. There is no guarding or rebound.  Musculoskeletal:        General: No tenderness.     Cervical back: Normal range of motion and neck supple. No rigidity.  Lymphadenopathy:     Cervical: No cervical adenopathy.  Skin:    Findings: No erythema or rash.  Neurological:     Mental Status: She is oriented to person, place, and time.     Cranial  Nerves: No cranial nerve deficit.     Motor: No abnormal muscle tone.     Coordination: Coordination abnormal.     Gait: Gait abnormal.     Deep Tendon Reflexes: Reflexes normal.  Psychiatric:        Behavior: Behavior normal.        Thought Content: Thought content normal.        Judgment: Judgment normal.  Antalgic gait R knee w/pain Edema R>L ankle, trace  Lab Results  Component Value Date   WBC 11.5 (H) 05/08/2023   HGB 13.9 05/08/2023   HCT 43.0 05/08/2023   PLT 303 05/08/2023   GLUCOSE 138 (H) 05/08/2023   CHOL 154 04/24/2022   TRIG 167.0 (H) 04/24/2022   HDL 34.40 (L) 04/24/2022   LDLDIRECT 144.0 02/09/2022   LDLCALC 86 04/24/2022   ALT 15 05/08/2023   AST 25 05/08/2023   NA 137 05/08/2023   K 3.8 05/08/2023   CL 105 05/08/2023   CREATININE 0.64 05/08/2023   BUN 24 (H) 05/08/2023   CO2 21 (L) 05/08/2023   TSH 3.57 04/24/2022   INR 1.9 (H) 05/09/2023   HGBA1C 6.7 (H) 04/24/2022   MICROALBUR 2.3 (H) 04/24/2022    VAS US  LOWER EXTREMITY VENOUS (DVT) Result Date: 05/09/2023  Lower Venous DVT Study Patient Name:  [name] Little [facility]  Date of Exam:   05/09/2023 Medical Rec #: [id number]             Accession #:    [id number] Date of Birth: 01-05-1934            Patient Gender: F Patient Age:   78 years Exam Location:  [facility] Procedure:      VAS US  LOWER EXTREMITY VENOUS (DVT) Referring Phys: Townsend Freud --------------------------------------------------------------------------------  Indications: Pain.  Risk Factors: Hx of PE DVT Hx past pregnancy and obesity. Limitations: Body habitus. Comparison Study: No significant changes seen since previous exam 11/16/23. Performing Technologist: Estanislao Heimlich  Examination Guidelines: A complete evaluation includes B-mode imaging, spectral Doppler, color Doppler, and power Doppler as needed of all accessible portions of each vessel. Bilateral testing is considered an integral part of a complete examination. Limited  examinations for reoccurring indications may be performed as noted. The reflux portion of the exam is performed with the patient in reverse Trendelenburg.  +---------+---------------+---------+-----------+----------+-------------------+ RIGHT    CompressibilityPhasicitySpontaneityPropertiesThrombus Aging      +---------+---------------+---------+-----------+----------+-------------------+ CFV      Full           Yes  Yes                                      +---------+---------------+---------+-----------+----------+-------------------+ SFJ      Full                                                             +---------+---------------+---------+-----------+----------+-------------------+ FV Prox  Full                                                             +---------+---------------+---------+-----------+----------+-------------------+ FV Mid   Full                                                             +---------+---------------+---------+-----------+----------+-------------------+ FV DistalFull                                                             +---------+---------------+---------+-----------+----------+-------------------+ PFV      Full                                                             +---------+---------------+---------+-----------+----------+-------------------+ POP      Full           Yes      Yes                                      +---------+---------------+---------+-----------+----------+-------------------+ PTV      Full                    Yes                                      +---------+---------------+---------+-----------+----------+-------------------+ PERO     Full                    Yes                  Not well visualized +---------+---------------+---------+-----------+----------+-------------------+   +---------+---------------+---------+-----------+----------+-------------------+ LEFT      CompressibilityPhasicitySpontaneityPropertiesThrombus Aging      +---------+---------------+---------+-----------+----------+-------------------+ CFV      Full           Yes      Yes                                      +---------+---------------+---------+-----------+----------+-------------------+  SFJ      Full                                                             +---------+---------------+---------+-----------+----------+-------------------+ FV Prox  Full                                                             +---------+---------------+---------+-----------+----------+-------------------+ FV Mid   Full                                                             +---------+---------------+---------+-----------+----------+-------------------+ FV DistalFull                                                             +---------+---------------+---------+-----------+----------+-------------------+ PFV      Full                                                             +---------+---------------+---------+-----------+----------+-------------------+ POP      Full           Yes      Yes                                      +---------+---------------+---------+-----------+----------+-------------------+ PTV      Full                    Yes                                      +---------+---------------+---------+-----------+----------+-------------------+ PERO     Full                    Yes                  Not well visualized +---------+---------------+---------+-----------+----------+-------------------+     Summary: BILATERAL: - No evidence of deep vein thrombosis seen in the lower extremities, bilaterally. -No evidence of popliteal cyst, bilaterally.   *See table(s) above for measurements and observations. Electronically signed by Jimmye Moulds MD on 05/09/2023 at 11:53:35 AM.    Final    DG Chest 2 View Result Date:  05/09/2023 CLINICAL DATA:  Lower extremity swelling and redness EXAM: CHEST - 2 VIEW COMPARISON:  08/02/2022 FINDINGS: Mild cardiomegaly with aortic atherosclerosis. Patchy atelectasis left lung base. No significant pleural effusion. IMPRESSION: Mild cardiomegaly. Patchy atelectasis  left lung base. Electronically Signed   By: Esmeralda Hedge M.D.   On: 05/09/2023 00:48    Assessment & Plan:   Problem List Items Addressed This Visit     Essential hypertension   On Amlodipine       Venous (peripheral) insufficiency   Use compression socks Lasix  prn      Anticoagulant long-term use   On Coumadin  for h/o DVT x 40 years      Anxiety state   Husband 56 w/dementia Not on Rx      History of UTI   Check UA      Knee pain, right - Primary   Blue-Emu cream was recommended to use 2-3 times a day       Edema   On Lasix          No orders of the defined types were placed in this encounter.     Follow-up: Return in about 3 months (around 08/21/2023) for a follow-up visit.  Anitra Barn, MD

## 2023-05-24 ENCOUNTER — Encounter: Payer: Self-pay | Admitting: Radiology

## 2023-05-30 DIAGNOSIS — L570 Actinic keratosis: Secondary | ICD-10-CM | POA: Diagnosis not present

## 2023-05-30 DIAGNOSIS — D044 Carcinoma in situ of skin of scalp and neck: Secondary | ICD-10-CM | POA: Diagnosis not present

## 2023-05-31 ENCOUNTER — Ambulatory Visit: Admitting: Medical

## 2023-06-01 ENCOUNTER — Telehealth: Payer: Self-pay | Admitting: Nurse Practitioner

## 2023-06-01 ENCOUNTER — Ambulatory Visit: Admitting: Nurse Practitioner

## 2023-06-01 ENCOUNTER — Other Ambulatory Visit: Payer: Self-pay | Admitting: Nurse Practitioner

## 2023-06-01 VITALS — BP 160/100 | HR 64 | Temp 97.8°F | Ht 63.0 in | Wt 174.0 lb

## 2023-06-01 DIAGNOSIS — J329 Chronic sinusitis, unspecified: Secondary | ICD-10-CM | POA: Diagnosis not present

## 2023-06-01 LAB — POCT INFLUENZA A/B
Influenza A, POC: NEGATIVE
Influenza B, POC: NEGATIVE

## 2023-06-01 LAB — POCT RESPIRATORY SYNCYTIAL VIRUS: RSV Rapid Ag: NEGATIVE

## 2023-06-01 LAB — POC COVID19 BINAXNOW: SARS Coronavirus 2 Ag: NEGATIVE

## 2023-06-01 MED ORDER — PREDNISONE 20 MG PO TABS
20.0000 mg | ORAL_TABLET | Freq: Every day | ORAL | 0 refills | Status: AC
Start: 2023-06-01 — End: ?

## 2023-06-01 MED ORDER — OMEPRAZOLE 20 MG PO CPDR: 20.0000 mg | DELAYED_RELEASE_CAPSULE | Freq: Every day | ORAL | 0 refills | Status: DC

## 2023-06-01 NOTE — Progress Notes (Signed)
 Established Patient Office Visit  Subjective   Patient ID: [name] Little, female    DOB: [birth year]  Age: 89 y.o. MRN: [id number]  Chief Complaint  Patient presents with   Sinusitis    Patient arrives for acute for the above.  Reports that she has been experiencing left-sided maxillary pain for about 1 week.  It is causing tearing of her eye and pain is radiating to left ear.  She reports that she has history of sinus infections and usually requires treatment with steroid.  She does not like antibiotics as they often cause yeast infection.  She reports that she has been afebrile but has developed a productive cough.    Review of Systems  HENT:  Positive for congestion and sinus pain.   Eyes:  Negative for blurred vision.  Respiratory:  Positive for cough and sputum production. Negative for wheezing.   Cardiovascular:  Negative for chest pain.  Neurological:  Negative for headaches.      Objective:     BP (!) 160/100   Pulse 64   Temp 97.8 F (36.6 C) (Temporal)   Ht 5\' 3"  (1.6 m)   Wt 174 lb (78.9 kg)   SpO2 99%   BMI 30.82 kg/m  BP Readings from Last 3 Encounters:  06/01/23 (!) 160/100  05/22/23 122/84  05/11/23 (!) 154/68   Wt Readings from Last 3 Encounters:  06/01/23 174 lb (78.9 kg)  05/22/23 172 lb 12.8 oz (78.4 kg)  05/11/23 173 lb (78.5 kg)      Physical Exam Vitals reviewed.  Constitutional:      General: She is not in acute distress.    Appearance: Normal appearance.  HENT:     Head: Normocephalic and atraumatic.     Right Ear: Hearing, tympanic membrane, ear canal and external ear normal.     Left Ear: Hearing, tympanic membrane, ear canal and external ear normal.     Ears:     Comments: Cerumen in both ears partially blocks TM, but no evidence of otitis media from what is visualized Neck:     Vascular: No carotid bruit.  Cardiovascular:     Rate and Rhythm: Normal rate and regular rhythm.     Pulses: Normal pulses.     Heart  sounds: Normal heart sounds.  Pulmonary:     Effort: Pulmonary effort is normal.     Breath sounds: Normal breath sounds.  Skin:    General: Skin is warm and dry.  Neurological:     General: No focal deficit present.     Mental Status: She is alert and oriented to person, place, and time.  Psychiatric:        Mood and Affect: Mood normal.        Behavior: Behavior normal.        Judgment: Judgment normal.      Results for orders placed or performed in visit on 06/01/23  POC COVID-19  Result Value Ref Range   SARS Coronavirus 2 Ag Negative Negative  POCT Influenza A/B  Result Value Ref Range   Influenza A, POC Negative Negative   Influenza B, POC Negative Negative  POCT respiratory syncytial virus  Result Value Ref Range   RSV Rapid Ag negative       The ASCVD Risk score (Arnett DK, et al., 2019) failed to calculate for the following reasons:   The 2019 ASCVD risk score is only valid for ages 8 to 44    Assessment &  Plan:   Problem List Items Addressed This Visit       Respiratory   Sinusitis - Primary   Acute Point-of-care flu, COVID-19, RSV testing negative today Vital signs stable Patient does have significant tenderness over left frontal sinus.  No evidence of otitis media.  Will treat with prednisone  20 mg by mouth every morning with breakfast, patient is on Coumadin  so we will also add her on to PPI temporarily to hopefully lower bleeding risk. Last INR 2.5 . Patient to return to clinic in 4-5 days for INR check. Signs and symptoms of bleeding discussed with patient and educated on when to call the office.  BP elevated upon rooming, patient is asymptomatic and reports she has white coat syndrome. She has  BP cuff at home. She was told to monitor BP at home and if she has systolic readings >150-160 of diastolic readings >100 she should call the office. She reports her understanding.       Relevant Medications   predniSONE  (DELTASONE ) 20 MG tablet   omeprazole  (PRILOSEC) 20 MG capsule   Other Relevant Orders   POC COVID-19 (Completed)   POCT Influenza A/B (Completed)   POCT respiratory syncytial virus (Completed)    Return if symptoms worsen or fail to improve.    Zorita Hiss, NP

## 2023-06-01 NOTE — Telephone Encounter (Signed)
 I did discuss PPI and risk of raised INR in this patient with supervising physician.  We have elected to discontinue omeprazole.  I have called the pharmacy and discontinued the medication.  I attempted to call the patient to let her know, but she did not answer.  I have left a voicemail asking her to call back.

## 2023-06-01 NOTE — Assessment & Plan Note (Signed)
 Acute Point-of-care flu, COVID-19, RSV testing negative today Vital signs stable Patient does have significant tenderness over left frontal sinus.  No evidence of otitis media.  Will treat with prednisone  20 mg by mouth every morning with breakfast, patient is on Coumadin  so we will also add her on to PPI temporarily to hopefully lower bleeding risk. Last INR 2.5 . Patient to return to clinic in 4-5 days for INR check. Signs and symptoms of bleeding discussed with patient and educated on when to call the office.  BP elevated upon rooming, patient is asymptomatic and reports she has white coat syndrome. She has  BP cuff at home. She was told to monitor BP at home and if she has systolic readings >150-160 of diastolic readings >100 she should call the office. She reports her understanding.

## 2023-06-05 ENCOUNTER — Ambulatory Visit (INDEPENDENT_AMBULATORY_CARE_PROVIDER_SITE_OTHER)

## 2023-06-05 DIAGNOSIS — Z7901 Long term (current) use of anticoagulants: Secondary | ICD-10-CM

## 2023-06-05 LAB — POCT INR: INR: 2.1 (ref 2.0–3.0)

## 2023-06-05 MED ORDER — WARFARIN SODIUM 5 MG PO TABS: ORAL_TABLET | ORAL | 1 refills | Status: DC

## 2023-06-05 NOTE — Progress Notes (Addendum)
 Pt was prescribed prednisone , 20 mg daily, on 5/2 for sinus congestion. Also prescribed omeprazole x 5 days.  Pt requested an apt with PCP to discuss BP. Scheduled pt for Friday, 5/9. Continue 1 tablet daily except take 1/2 tablet on Tuesdays, Thursdays and Saturdays. Recheck in 6 weeks.  Medical screening examination/treatment/procedure(s) were performed by non-physician practitioner and as supervising physician I was immediately available for consultation/collaboration.  I agree with above. Adelaide Holy, MD

## 2023-06-05 NOTE — Patient Instructions (Addendum)
Pre visit review using our clinic review tool, if applicable. No additional management support is needed unless otherwise documented below in the visit note.  Continue 1 tablet daily except take 1/2 tablet on Tuesdays, Thursdays and Saturdays. Recheck in 6 weeks. 

## 2023-06-07 ENCOUNTER — Ambulatory Visit: Admitting: Internal Medicine

## 2023-06-08 ENCOUNTER — Telehealth: Payer: Self-pay | Admitting: Internal Medicine

## 2023-06-08 ENCOUNTER — Ambulatory Visit: Admitting: Internal Medicine

## 2023-06-08 ENCOUNTER — Encounter: Payer: Self-pay | Admitting: Internal Medicine

## 2023-06-08 VITALS — BP 176/88 | HR 96 | Temp 98.0°F | Ht 63.0 in | Wt 169.0 lb

## 2023-06-08 DIAGNOSIS — K0889 Other specified disorders of teeth and supporting structures: Secondary | ICD-10-CM | POA: Diagnosis not present

## 2023-06-08 DIAGNOSIS — Z7901 Long term (current) use of anticoagulants: Secondary | ICD-10-CM | POA: Diagnosis not present

## 2023-06-08 MED ORDER — FLUCONAZOLE 150 MG PO TABS: 150.0000 mg | ORAL_TABLET | Freq: Once | ORAL | 1 refills | Status: AC

## 2023-06-08 MED ORDER — CLINDAMYCIN HCL 300 MG PO CAPS: 300.0000 mg | ORAL_CAPSULE | Freq: Four times a day (QID) | ORAL | 0 refills | Status: AC

## 2023-06-08 NOTE — Patient Instructions (Signed)
  Use a Probiotic, ie Activia yogurt Dental appt on Mon-Tue

## 2023-06-08 NOTE — Assessment & Plan Note (Signed)
 We checked drug interactions

## 2023-06-08 NOTE — Telephone Encounter (Signed)
 Pt was placed on clindamycin  x 7 days starting today for tooth infection. Pt was also prescribed fluconazole  PRN for yeast infection.   Advised there is no interaction with abx and warfarin but there is a major interaction with the antifungal. Advised if she has to take it to contact the coumadin  clinic so her warfarin dose can be adjusted. Pt verbalized understanding.

## 2023-06-08 NOTE — Assessment & Plan Note (Signed)
 L upper molar w/a crown - painful on palpation Rx - Clinda po x 7 d (multiple abx intolerance) Diflucan  Probiotic Dental appt on Mon-Tue

## 2023-06-08 NOTE — Telephone Encounter (Signed)
 Patient would like to discuss her warfarin with Cathleen Coach from the coumadin  clinic. She has questions regarding it with other medications. Best callback is [phone].

## 2023-06-08 NOTE — Progress Notes (Signed)
 Subjective:  Patient ID: [name] Little, female    DOB: [birth year]  Age: 89 y.o. MRN: [id number]  CC: Hypertension (Pt states she believes her elevated BP I coming from the pain she has been in. Pt is also wanting an ABX for a possible sinus infection that developed after a bing sting she had x3 weeks.)   HPI [name] Little presents for Hypertension Pt states she believes her elevated BP I coming from the pain she has been in. Pt is also wanting an ABX for a possible sinus infection that developed after a bing sting she had x3 weeks. C/o L cheek pain - severe at times   Outpatient Medications Prior to Visit  Medication Sig Dispense Refill   albuterol  (VENTOLIN  HFA) 108 (90 Base) MCG/ACT inhaler Inhale 2 puffs into the lungs every 6 (six) hours as needed. 18 g 0   B Complex Vitamins (VITAMIN B COMPLEX PO) Take 1 tablet by mouth daily.     Cranberry 1000 MG CAPS Take 1 capsule by mouth 2 (two) times daily.     furosemide  (LASIX ) 20 MG tablet Take 1 tablet (20 mg total) by mouth daily. 90 tablet 3   metroNIDAZOLE  (METROCREAM ) 0.75 % cream Apply 1 Application topically daily as needed (Irritation).     Nebivolol  HCl 20 MG TABS Take 1 tablet (20 mg total) by mouth daily. 90 tablet 3   predniSONE  (DELTASONE ) 20 MG tablet Take 1 tablet (20 mg total) by mouth daily with breakfast. 5 tablet 0   valsartan  (DIOVAN ) 320 MG tablet Take 1 tablet (320 mg total) by mouth daily. 90 tablet 3   warfarin (COUMADIN ) 5 MG tablet TAKE 1 TABLET BY MOUTH DAILY EXCEPT TAKE 1/2 TABLET ON TUESDAYS, THURSDAY, SATURDAYS OR AS DIRECTED BY ANTICOAGULATION CLINIC 100 tablet 1   No facility-administered medications prior to visit.    ROS: Review of Systems  Constitutional:  Positive for fatigue. Negative for activity change, appetite change, chills, fever and unexpected weight change.  HENT:  Positive for sinus pressure. Negative for congestion, mouth sores and trouble swallowing.   Eyes:  Negative for  visual disturbance.  Respiratory:  Negative for cough and chest tightness.   Gastrointestinal:  Negative for abdominal pain and nausea.  Genitourinary:  Negative for difficulty urinating, frequency and vaginal pain.  Musculoskeletal:  Positive for arthralgias, back pain and gait problem.  Skin:  Negative for pallor and rash.  Neurological:  Negative for dizziness, tremors, weakness, numbness and headaches.  Psychiatric/Behavioral:  Negative for confusion and sleep disturbance.     Objective:  BP (!) 176/88   Pulse 96   Temp 98 F (36.7 C) (Oral)   Ht 5\' 3"  (1.6 m)   Wt 169 lb (76.7 kg)   SpO2 93%   BMI 29.94 kg/m   BP Readings from Last 3 Encounters:  06/08/23 (!) 176/88  06/01/23 (!) 160/100  05/22/23 122/84    Wt Readings from Last 3 Encounters:  06/08/23 169 lb (76.7 kg)  06/01/23 174 lb (78.9 kg)  05/22/23 172 lb 12.8 oz (78.4 kg)    Physical Exam Constitutional:      General: She is not in acute distress.    Appearance: She is well-developed. She is obese. She is not toxic-appearing.  HENT:     Head: Normocephalic.     Right Ear: External ear normal.     Left Ear: External ear normal.     Nose: Nose normal.  Eyes:     General:  Right eye: No discharge.        Left eye: No discharge.     Conjunctiva/sclera: Conjunctivae normal.     Pupils: Pupils are equal, round, and reactive to light.  Neck:     Thyroid : No thyromegaly.     Vascular: No JVD.     Trachea: No tracheal deviation.  Cardiovascular:     Rate and Rhythm: Normal rate and regular rhythm.     Heart sounds: Normal heart sounds.  Pulmonary:     Effort: No respiratory distress.     Breath sounds: No stridor. No wheezing.  Abdominal:     General: Bowel sounds are normal. There is no distension.     Palpations: Abdomen is soft. There is no mass.     Tenderness: There is no abdominal tenderness. There is no guarding or rebound.  Musculoskeletal:        General: Tenderness present.      Cervical back: Normal range of motion and neck supple. No rigidity.  Lymphadenopathy:     Cervical: No cervical adenopathy.  Skin:    Findings: No erythema or rash.  Neurological:     Cranial Nerves: No cranial nerve deficit.     Motor: No abnormal muscle tone.     Coordination: Coordination normal.     Gait: Gait abnormal.     Deep Tendon Reflexes: Reflexes normal.  Psychiatric:        Behavior: Behavior normal.        Thought Content: Thought content normal.        Judgment: Judgment normal.     L cheek pain w/palpation L upper molar w/a crown - painful on palpation R knee w/pain   Lab Results  Component Value Date   WBC 11.5 (H) 05/08/2023   HGB 13.9 05/08/2023   HCT 43.0 05/08/2023   PLT 303 05/08/2023   GLUCOSE 138 (H) 05/08/2023   CHOL 154 04/24/2022   TRIG 167.0 (H) 04/24/2022   HDL 34.40 (L) 04/24/2022   LDLDIRECT 144.0 02/09/2022   LDLCALC 86 04/24/2022   ALT 15 05/08/2023   AST 25 05/08/2023   NA 137 05/08/2023   K 3.8 05/08/2023   CL 105 05/08/2023   CREATININE 0.64 05/08/2023   BUN 24 (H) 05/08/2023   CO2 21 (L) 05/08/2023   TSH 3.57 04/24/2022   INR 2.1 06/05/2023   HGBA1C 6.7 (H) 04/24/2022   MICROALBUR 2.3 (H) 04/24/2022    VAS US  LOWER EXTREMITY VENOUS (DVT) Result Date: 05/09/2023  Lower Venous DVT Study Patient Name:  [name] Little [facility]  Date of Exam:   05/09/2023 Medical Rec #: [id number]             Accession #:    [id number] Date of Birth: 02-14-1933            Patient Gender: F Patient Age:   15 years Exam Location:  [facility] Procedure:      VAS US  LOWER EXTREMITY VENOUS (DVT) Referring Phys: Townsend Freud --------------------------------------------------------------------------------  Indications: Pain.  Risk Factors: Hx of PE DVT Hx past pregnancy and obesity. Limitations: Body habitus. Comparison Study: No significant changes seen since previous exam 11/16/23. Performing Technologist: Estanislao Heimlich  Examination Guidelines: A  complete evaluation includes B-mode imaging, spectral Doppler, color Doppler, and power Doppler as needed of all accessible portions of each vessel. Bilateral testing is considered an integral part of a complete examination. Limited examinations for reoccurring indications may be performed as noted. The reflux portion of the  exam is performed with the patient in reverse Trendelenburg.  +---------+---------------+---------+-----------+----------+-------------------+ RIGHT    CompressibilityPhasicitySpontaneityPropertiesThrombus Aging      +---------+---------------+---------+-----------+----------+-------------------+ CFV      Full           Yes      Yes                                      +---------+---------------+---------+-----------+----------+-------------------+ SFJ      Full                                                             +---------+---------------+---------+-----------+----------+-------------------+ FV Prox  Full                                                             +---------+---------------+---------+-----------+----------+-------------------+ FV Mid   Full                                                             +---------+---------------+---------+-----------+----------+-------------------+ FV DistalFull                                                             +---------+---------------+---------+-----------+----------+-------------------+ PFV      Full                                                             +---------+---------------+---------+-----------+----------+-------------------+ POP      Full           Yes      Yes                                      +---------+---------------+---------+-----------+----------+-------------------+ PTV      Full                    Yes                                      +---------+---------------+---------+-----------+----------+-------------------+ PERO     Full                     Yes                  Not well visualized +---------+---------------+---------+-----------+----------+-------------------+   +---------+---------------+---------+-----------+----------+-------------------+ LEFT     CompressibilityPhasicitySpontaneityPropertiesThrombus Aging      +---------+---------------+---------+-----------+----------+-------------------+  CFV      Full           Yes      Yes                                      +---------+---------------+---------+-----------+----------+-------------------+ SFJ      Full                                                             +---------+---------------+---------+-----------+----------+-------------------+ FV Prox  Full                                                             +---------+---------------+---------+-----------+----------+-------------------+ FV Mid   Full                                                             +---------+---------------+---------+-----------+----------+-------------------+ FV DistalFull                                                             +---------+---------------+---------+-----------+----------+-------------------+ PFV      Full                                                             +---------+---------------+---------+-----------+----------+-------------------+ POP      Full           Yes      Yes                                      +---------+---------------+---------+-----------+----------+-------------------+ PTV      Full                    Yes                                      +---------+---------------+---------+-----------+----------+-------------------+ PERO     Full                    Yes                  Not well visualized +---------+---------------+---------+-----------+----------+-------------------+     Summary: BILATERAL: - No evidence of deep vein thrombosis seen in the lower extremities, bilaterally.  -No evidence of popliteal cyst, bilaterally.   *See table(s) above for measurements and  observations. Electronically signed by Jimmye Moulds MD on 05/09/2023 at 11:53:35 AM.    Final    DG Chest 2 View Result Date: 05/09/2023 CLINICAL DATA:  Lower extremity swelling and redness EXAM: CHEST - 2 VIEW COMPARISON:  08/02/2022 FINDINGS: Mild cardiomegaly with aortic atherosclerosis. Patchy atelectasis left lung base. No significant pleural effusion. IMPRESSION: Mild cardiomegaly. Patchy atelectasis left lung base. Electronically Signed   By: Esmeralda Hedge M.D.   On: 05/09/2023 00:48    Assessment & Plan:   Problem List Items Addressed This Visit     Anticoagulant long-term use   We checked drug interactions      Pain, dental - Primary   L upper molar w/a crown - painful on palpation Rx - Clinda po x 7 d (multiple abx intolerance) Diflucan  Probiotic Dental appt on Mon-Tue         Meds ordered this encounter  Medications   fluconazole  (DIFLUCAN ) 150 MG tablet    Sig: Take 1 tablet (150 mg total) by mouth once for 1 dose.    Dispense:  1 tablet    Refill:  1   clindamycin  (CLEOCIN ) 300 MG capsule    Sig: Take 1 capsule (300 mg total) by mouth 4 (four) times daily for 7 days.    Dispense:  28 capsule    Refill:  0      Follow-up: Return in about 3 months (around 09/08/2023) for a follow-up visit.  Anitra Barn, MD

## 2023-06-10 ENCOUNTER — Other Ambulatory Visit: Payer: Self-pay | Admitting: Family Medicine

## 2023-06-10 DIAGNOSIS — I10 Essential (primary) hypertension: Secondary | ICD-10-CM

## 2023-06-12 ENCOUNTER — Other Ambulatory Visit: Payer: Self-pay | Admitting: Family Medicine

## 2023-06-12 DIAGNOSIS — I10 Essential (primary) hypertension: Secondary | ICD-10-CM

## 2023-06-12 NOTE — Telephone Encounter (Signed)
 Pt reports she had apt with dentist today and they are advising the tooth needs extracted but they will not perform the extraction due to pt being on warfarin. They have referred her to [facility], Dr. Donnita Gales and Dr. Lovell Rubenstein. Pt has an apt tomorrow at 10:30 and is not sure if it is just for a consult or for the procedure. Advised to f/u with coumadin  clinic tomorrow but best practice now is to remain on warfarin if only a single tooth extraction. Advised many dentists request an INR check the day of the extraction and if they need that we can check her INR. Pt reports the dentist this morning told her she does still have infection in the tooth. Advised if any questions she can give the direct number to the coumadin  clinic to the dentists. Pt verbalized understanding.

## 2023-06-13 NOTE — Telephone Encounter (Signed)
 Tried to contact pt but recording said "we're sorry but your call cannot be completed at this time. Try your call again later."

## 2023-06-13 NOTE — Telephone Encounter (Signed)
 Tried to contact pt again but received the same msg.

## 2023-06-13 NOTE — Telephone Encounter (Signed)
 Thank you :)

## 2023-06-14 NOTE — Telephone Encounter (Signed)
 Tried to contact pt again and received the same msg.

## 2023-06-14 NOTE — Telephone Encounter (Signed)
 Tried to contact pt again but received the same msg.

## 2023-06-15 NOTE — Telephone Encounter (Addendum)
Left HIPAA compliant VM

## 2023-06-18 NOTE — Telephone Encounter (Signed)
 Pt returned call on Friday, 5/16, after coumadin  clinic nurse was out of the office.   Tried to contact pt this morning but not answer, no VM and recording says, "Your call cannot be completed at this time."

## 2023-06-18 NOTE — Telephone Encounter (Signed)
 Tried to contact pt but received the same msg.

## 2023-06-19 ENCOUNTER — Ambulatory Visit: Admitting: Internal Medicine

## 2023-06-19 ENCOUNTER — Ambulatory Visit

## 2023-06-19 NOTE — Telephone Encounter (Addendum)
Left HIPAA compliant VM

## 2023-06-28 ENCOUNTER — Ambulatory Visit: Admitting: Internal Medicine

## 2023-07-02 ENCOUNTER — Telehealth: Payer: Self-pay

## 2023-07-02 NOTE — Telephone Encounter (Signed)
 Pt LVM requesting her coumadin  clinic apt on 6/17 be changed from 11:00 to 10:00. Tried to contact pt but no answer and no VM. Changed apt to 10:15 because 10:00 is not available.

## 2023-07-03 ENCOUNTER — Ambulatory Visit

## 2023-07-03 NOTE — Telephone Encounter (Signed)
 Pt returned call. Changed time of coumadin  clinic apt.

## 2023-07-09 DIAGNOSIS — H353131 Nonexudative age-related macular degeneration, bilateral, early dry stage: Secondary | ICD-10-CM | POA: Diagnosis not present

## 2023-07-09 DIAGNOSIS — H524 Presbyopia: Secondary | ICD-10-CM | POA: Diagnosis not present

## 2023-07-09 DIAGNOSIS — Z961 Presence of intraocular lens: Secondary | ICD-10-CM | POA: Diagnosis not present

## 2023-07-17 ENCOUNTER — Ambulatory Visit

## 2023-07-17 DIAGNOSIS — Z7901 Long term (current) use of anticoagulants: Secondary | ICD-10-CM | POA: Diagnosis not present

## 2023-07-17 LAB — POCT INR: INR: 3 (ref 2.0–3.0)

## 2023-07-17 NOTE — Patient Instructions (Addendum)
Pre visit review using our clinic review tool, if applicable. No additional management support is needed unless otherwise documented below in the visit note.  Continue 1 tablet daily except take 1/2 tablet on Tuesdays, Thursdays and Saturdays. Recheck in 6 weeks. 

## 2023-07-17 NOTE — Progress Notes (Cosign Needed)
Continue 1 tablet daily except take 1/2 tablet on Tuesdays, Thursdays and Saturdays. Recheck in 6 weeks. 

## 2023-07-31 ENCOUNTER — Ambulatory Visit

## 2023-08-13 ENCOUNTER — Ambulatory Visit: Admitting: Internal Medicine

## 2023-08-14 ENCOUNTER — Ambulatory Visit: Payer: Self-pay | Admitting: Internal Medicine

## 2023-08-14 ENCOUNTER — Encounter: Payer: Self-pay | Admitting: Internal Medicine

## 2023-08-14 VITALS — BP 130/70 | HR 63 | Temp 98.2°F | Ht 63.0 in | Wt 175.0 lb

## 2023-08-14 DIAGNOSIS — I471 Supraventricular tachycardia, unspecified: Secondary | ICD-10-CM | POA: Diagnosis not present

## 2023-08-14 DIAGNOSIS — Z7901 Long term (current) use of anticoagulants: Secondary | ICD-10-CM

## 2023-08-14 DIAGNOSIS — I10 Essential (primary) hypertension: Secondary | ICD-10-CM | POA: Diagnosis not present

## 2023-08-14 DIAGNOSIS — D682 Hereditary deficiency of other clotting factors: Secondary | ICD-10-CM

## 2023-08-14 DIAGNOSIS — R609 Edema, unspecified: Secondary | ICD-10-CM

## 2023-08-14 DIAGNOSIS — E669 Obesity, unspecified: Secondary | ICD-10-CM | POA: Diagnosis not present

## 2023-08-14 DIAGNOSIS — E1169 Type 2 diabetes mellitus with other specified complication: Secondary | ICD-10-CM

## 2023-08-14 DIAGNOSIS — R600 Localized edema: Secondary | ICD-10-CM | POA: Diagnosis not present

## 2023-08-14 LAB — COMPREHENSIVE METABOLIC PANEL WITH GFR
ALT: 11 U/L (ref 0–35)
AST: 18 U/L (ref 0–37)
Albumin: 4.2 g/dL (ref 3.5–5.2)
Alkaline Phosphatase: 62 U/L (ref 39–117)
BUN: 20 mg/dL (ref 6–23)
CO2: 30 meq/L (ref 19–32)
Calcium: 9.4 mg/dL (ref 8.4–10.5)
Chloride: 105 meq/L (ref 96–112)
Creatinine, Ser: 0.74 mg/dL (ref 0.40–1.20)
GFR: 72.62 mL/min (ref >60.00)
Glucose, Bld: 103 mg/dL — ABNORMAL HIGH (ref 70–99)
Potassium: 4 meq/L (ref 3.5–5.1)
Sodium: 141 meq/L (ref 135–145)
Total Bilirubin: 0.6 mg/dL (ref 0.2–1.2)
Total Protein: 7.5 g/dL (ref 6.0–8.3)

## 2023-08-14 LAB — HEMOGLOBIN A1C: Hgb A1c MFr Bld: 7 % — ABNORMAL HIGH (ref 4.6–6.5)

## 2023-08-14 LAB — TSH: TSH: 3.93 u[IU]/mL (ref 0.35–5.50)

## 2023-08-14 LAB — BRAIN NATRIURETIC PEPTIDE: Pro B Natriuretic peptide (BNP): 193 pg/mL — ABNORMAL HIGH (ref 0.0–100.0)

## 2023-08-14 MED ORDER — FUROSEMIDE 20 MG PO TABS: 20.0000 mg | ORAL_TABLET | Freq: Every day | ORAL | 3 refills | Status: AC

## 2023-08-14 NOTE — Progress Notes (Signed)
 Subjective:  Patient ID: [name] Little, female    DOB: 1933-12-27  Age: 89 y.o. MRN: [id number]  CC: Medical Management of Chronic Issues (Pt states she has concerns about Nebivolol  and is wanting to discuss this medication and a possible alternative as well)   HPI [name] Little presents for B ankle swelling F/u on HTN C/o Nebivolol  causing weakness, edema. Not taking Lasix ...  Outpatient Medications Prior to Visit  Medication Sig Dispense Refill   albuterol  (VENTOLIN  HFA) 108 (90 Base) MCG/ACT inhaler Inhale 2 puffs into the lungs every 6 (six) hours as needed. 18 g 0   B Complex Vitamins (VITAMIN B COMPLEX PO) Take 1 tablet by mouth daily.     Cranberry 1000 MG CAPS Take 1 capsule by mouth 2 (two) times daily.     metroNIDAZOLE  (METROCREAM ) 0.75 % cream Apply 1 Application topically daily as needed (Irritation).     predniSONE  (DELTASONE ) 20 MG tablet Take 1 tablet (20 mg total) by mouth daily with breakfast. 5 tablet 0   valsartan  (DIOVAN ) 320 MG tablet TAKE 1 TABLET BY MOUTH EVERY DAY 90 tablet 0   warfarin (COUMADIN ) 5 MG tablet TAKE 1 TABLET BY MOUTH DAILY EXCEPT TAKE 1/2 TABLET ON TUESDAYS, THURSDAY, SATURDAYS OR AS DIRECTED BY ANTICOAGULATION CLINIC 100 tablet 1   furosemide  (LASIX ) 20 MG tablet Take 1 tablet (20 mg total) by mouth daily. 90 tablet 3   Nebivolol  HCl 20 MG TABS TAKE 1 TABLET BY MOUTH EVERY DAY 90 tablet 1   No facility-administered medications prior to visit.    ROS: Review of Systems  Constitutional:  Positive for fatigue. Negative for activity change, appetite change, chills and unexpected weight change.  HENT:  Negative for congestion, mouth sores and sinus pressure.   Eyes:  Negative for visual disturbance.  Respiratory:  Negative for cough and chest tightness.   Cardiovascular:  Positive for leg swelling.  Gastrointestinal:  Negative for abdominal pain and nausea.  Genitourinary:  Negative for difficulty urinating, frequency, urgency and  vaginal pain.  Musculoskeletal:  Positive for arthralgias and gait problem. Negative for back pain.  Skin:  Negative for pallor and rash.  Neurological:  Negative for dizziness, tremors, weakness, numbness and headaches.  Psychiatric/Behavioral:  Negative for confusion and sleep disturbance. The patient is not nervous/anxious.     Objective:  BP 130/70   Pulse 63   Temp 98.2 F (36.8 C) (Oral)   Ht 5' 3 (1.6 m)   Wt 175 lb (79.4 kg)   SpO2 95%   BMI 31.00 kg/m   BP Readings from Last 3 Encounters:  08/14/23 130/70  06/08/23 (!) 176/88  06/01/23 (!) 160/100    Wt Readings from Last 3 Encounters:  08/14/23 175 lb (79.4 kg)  07/31/23 169 lb (76.7 kg)  06/08/23 169 lb (76.7 kg)    Physical Exam Constitutional:      General: She is not in acute distress.    Appearance: She is well-developed. She is obese.  HENT:     Head: Normocephalic.     Right Ear: External ear normal.     Left Ear: External ear normal.     Nose: Nose normal.  Eyes:     General:        Right eye: No discharge.        Left eye: No discharge.     Conjunctiva/sclera: Conjunctivae normal.     Pupils: Pupils are equal, round, and reactive to light.  Neck:  Thyroid : No thyromegaly.     Vascular: No JVD.     Trachea: No tracheal deviation.  Cardiovascular:     Rate and Rhythm: Normal rate and regular rhythm.     Heart sounds: Normal heart sounds.  Pulmonary:     Effort: No respiratory distress.     Breath sounds: No stridor. No wheezing.  Abdominal:     General: Bowel sounds are normal. There is no distension.     Palpations: Abdomen is soft. There is no mass.     Tenderness: There is no abdominal tenderness. There is no guarding or rebound.  Musculoskeletal:        General: No tenderness.     Cervical back: Normal range of motion and neck supple. No rigidity.     Right lower leg: Edema present.     Left lower leg: Edema present.  Lymphadenopathy:     Cervical: No cervical adenopathy.   Skin:    Findings: No erythema or rash.  Neurological:     Mental Status: She is oriented to person, place, and time.     Cranial Nerves: No cranial nerve deficit.     Motor: No abnormal muscle tone.     Coordination: Coordination normal.     Gait: Gait abnormal.     Deep Tendon Reflexes: Reflexes normal.  Psychiatric:        Behavior: Behavior normal.        Thought Content: Thought content normal.        Judgment: Judgment normal.   Arthritic gait 1+ edema   Lab Results  Component Value Date   WBC 11.5 (H) 05/08/2023   HGB 13.9 05/08/2023   HCT 43.0 05/08/2023   PLT 303 05/08/2023   GLUCOSE 138 (H) 05/08/2023   CHOL 154 04/24/2022   TRIG 167.0 (H) 04/24/2022   HDL 34.40 (L) 04/24/2022   LDLDIRECT 144.0 02/09/2022   LDLCALC 86 04/24/2022   ALT 15 05/08/2023   AST 25 05/08/2023   NA 137 05/08/2023   K 3.8 05/08/2023   CL 105 05/08/2023   CREATININE 0.64 05/08/2023   BUN 24 (H) 05/08/2023   CO2 21 (L) 05/08/2023   TSH 3.57 04/24/2022   INR 3.0 07/17/2023   HGBA1C 6.7 (H) 04/24/2022    VAS US  LOWER EXTREMITY VENOUS (DVT) Result Date: 05/09/2023  Lower Venous DVT Study Patient Name:  KAIRIE VANGIESON [facility]  Date of Exam:   05/09/2023 Medical Rec #: [id number]             Accession #:    [id number] Date of Birth: Apr 16, 1933            Patient Gender: F Patient Age:   65 years Exam Location:  [facility] Procedure:      VAS US  LOWER EXTREMITY VENOUS (DVT) Referring Phys: RAYNELL NAEGELI --------------------------------------------------------------------------------  Indications: Pain.  Risk Factors: Hx of PE DVT Hx past pregnancy and obesity. Limitations: Body habitus. Comparison Study: No significant changes seen since previous exam 11/16/23. Performing Technologist: Garnette Rockers  Examination Guidelines: A complete evaluation includes B-mode imaging, spectral Doppler, color Doppler, and power Doppler as needed of all accessible portions of each vessel. Bilateral testing  is considered an integral part of a complete examination. Limited examinations for reoccurring indications may be performed as noted. The reflux portion of the exam is performed with the patient in reverse Trendelenburg.  +---------+---------------+---------+-----------+----------+-------------------+ RIGHT    CompressibilityPhasicitySpontaneityPropertiesThrombus Aging      +---------+---------------+---------+-----------+----------+-------------------+ CFV  Full           Yes      Yes                                      +---------+---------------+---------+-----------+----------+-------------------+ SFJ      Full                                                             +---------+---------------+---------+-----------+----------+-------------------+ FV Prox  Full                                                             +---------+---------------+---------+-----------+----------+-------------------+ FV Mid   Full                                                             +---------+---------------+---------+-----------+----------+-------------------+ FV DistalFull                                                             +---------+---------------+---------+-----------+----------+-------------------+ PFV      Full                                                             +---------+---------------+---------+-----------+----------+-------------------+ POP      Full           Yes      Yes                                      +---------+---------------+---------+-----------+----------+-------------------+ PTV      Full                    Yes                                      +---------+---------------+---------+-----------+----------+-------------------+ PERO     Full                    Yes                  Not well visualized +---------+---------------+---------+-----------+----------+-------------------+    +---------+---------------+---------+-----------+----------+-------------------+ LEFT     CompressibilityPhasicitySpontaneityPropertiesThrombus Aging      +---------+---------------+---------+-----------+----------+-------------------+ CFV      Full           Yes  Yes                                      +---------+---------------+---------+-----------+----------+-------------------+ SFJ      Full                                                             +---------+---------------+---------+-----------+----------+-------------------+ FV Prox  Full                                                             +---------+---------------+---------+-----------+----------+-------------------+ FV Mid   Full                                                             +---------+---------------+---------+-----------+----------+-------------------+ FV DistalFull                                                             +---------+---------------+---------+-----------+----------+-------------------+ PFV      Full                                                             +---------+---------------+---------+-----------+----------+-------------------+ POP      Full           Yes      Yes                                      +---------+---------------+---------+-----------+----------+-------------------+ PTV      Full                    Yes                                      +---------+---------------+---------+-----------+----------+-------------------+ PERO     Full                    Yes                  Not well visualized +---------+---------------+---------+-----------+----------+-------------------+     Summary: BILATERAL: - No evidence of deep vein thrombosis seen in the lower extremities, bilaterally. -No evidence of popliteal cyst, bilaterally.   *See table(s) above for measurements and observations. Electronically signed by Lonni Gaskins MD on 05/09/2023 at 11:53:35 AM.    Final    DG Chest 2 View  Result Date: 05/09/2023 CLINICAL DATA:  Lower extremity swelling and redness EXAM: CHEST - 2 VIEW COMPARISON:  08/02/2022 FINDINGS: Mild cardiomegaly with aortic atherosclerosis. Patchy atelectasis left lung base. No significant pleural effusion. IMPRESSION: Mild cardiomegaly. Patchy atelectasis left lung base. Electronically Signed   By: Luke Bun M.D.   On: 05/09/2023 00:48    Assessment & Plan:   Problem List Items Addressed This Visit     Essential hypertension   Worse D/c Nebivolol  Re-start Lasix  every day Cont Diovan  qd      Relevant Medications   furosemide  (LASIX ) 20 MG tablet   Paroxysmal supraventricular tachycardia (HCC)   Amlodipine , Nebivilol, intolerant (D/c'd) HR is ok      Relevant Medications   furosemide  (LASIX ) 20 MG tablet   Type 2 diabetes mellitus with obesity (HCC)   Check A1      Relevant Orders   Comprehensive metabolic panel with GFR   Hemoglobin A1c   Edema - Primary   Worse Re-start Lasix  every day Cont Diovan       Relevant Orders   Comprehensive metabolic panel with GFR   TSH      Meds ordered this encounter  Medications   furosemide  (LASIX ) 20 MG tablet    Sig: Take 1 tablet (20 mg total) by mouth daily.    Dispense:  90 tablet    Refill:  3      Follow-up: Return in about 6 weeks (around 09/25/2023) for a follow-up visit.  Marolyn Noel, MD

## 2023-08-14 NOTE — Assessment & Plan Note (Signed)
 Amlodipine , Nebivilol, intolerant (D/c'd) HR is ok

## 2023-08-14 NOTE — Assessment & Plan Note (Signed)
 Worse Re-start Lasix  every day Cont Diovan 

## 2023-08-14 NOTE — Assessment & Plan Note (Signed)
 Worse D/c Nebivolol  Re-start Lasix  every day Cont Diovan  qd

## 2023-08-14 NOTE — Patient Instructions (Signed)
  Stop Nebivolol  Re-start Lasix  every day Continue Diovan  daily

## 2023-08-14 NOTE — Assessment & Plan Note (Signed)
 Check A1

## 2023-08-20 ENCOUNTER — Ambulatory Visit: Payer: Self-pay | Admitting: Internal Medicine

## 2023-08-21 ENCOUNTER — Ambulatory Visit: Payer: Self-pay | Admitting: Internal Medicine

## 2023-08-28 ENCOUNTER — Ambulatory Visit

## 2023-08-28 DIAGNOSIS — Z7901 Long term (current) use of anticoagulants: Secondary | ICD-10-CM

## 2023-08-28 LAB — POCT INR: INR: 1.9 — AB (ref 2.0–3.0)

## 2023-08-28 NOTE — Patient Instructions (Addendum)
 Pre visit review using our clinic review tool, if applicable. No additional management support is needed unless otherwise documented below in the visit note.  Increase dose today to take 1 tablet and then continue 1 tablet daily except take 1/2 tablet on Tuesdays, Thursdays and Saturdays. Recheck in 4 weeks.

## 2023-08-28 NOTE — Progress Notes (Signed)
 Pt has been eating a lot of tomatoes.  Increase dose today to take 1 tablet and then continue 1 tablet daily except take 1/2 tablet on Tuesdays, Thursdays and Saturdays. Recheck in 4 weeks.  Pt requested to make a PCP apt for her husband. Made apt for pt per request.

## 2023-09-13 ENCOUNTER — Encounter

## 2023-09-13 NOTE — Progress Notes (Addendum)
 This encounter was created in error - please disregard.  Pt declined to complete AWV.  Noted.  Thanks, AP

## 2023-09-17 ENCOUNTER — Telehealth: Payer: Self-pay

## 2023-09-17 NOTE — Telephone Encounter (Signed)
Tried to contact pt again but no answer and no VM ?

## 2023-09-17 NOTE — Telephone Encounter (Signed)
 Pt LVM reporting she had a fall wants to know if she can take and Advil with her warfarin.   Tried to contact pt  three times but no answer and no VM.

## 2023-09-18 NOTE — Telephone Encounter (Signed)
I agree with your plan. Thank you 

## 2023-09-18 NOTE — Telephone Encounter (Signed)
 Pt reports she fell yesterday and bruised her ribs. Advised cannot take ibuprofen with warfarin due to interaction. Advised her to be seen in the clinic. Pt reports she has an apt with PCP next week and right now she is going to wait until that apt. Advised if bruising worsens or pain worsens to go to ER. Advised if she develops any new symptoms to go to ER. Pt verbalized understanding.

## 2023-09-19 ENCOUNTER — Other Ambulatory Visit: Payer: Self-pay | Admitting: Family Medicine

## 2023-09-19 DIAGNOSIS — I10 Essential (primary) hypertension: Secondary | ICD-10-CM

## 2023-09-19 NOTE — Telephone Encounter (Signed)
 Pt LVM reporting she needed a return call because she thinks she messed up her warfarin dosing.   Tried to contact pt but no answer and recording said, your call cannot be completed at this time, try your call again later.

## 2023-09-19 NOTE — Telephone Encounter (Signed)
 Tried to contact pt two additional times and getting the same recording.

## 2023-09-20 NOTE — Telephone Encounter (Signed)
 Tried to contact pt again but keep getting same recording.

## 2023-09-21 NOTE — Telephone Encounter (Signed)
 Contacted pt who reports she has not messed up her warfarin dosing. She was just concerned if she could take Advil. Advised she cannot take Advil due to interaction.   Pt reports her ribs are feeling a little better but still painful. Advised if anything worsens to go to UC. Pt verbalized understanding.

## 2023-09-25 ENCOUNTER — Ambulatory Visit

## 2023-09-25 ENCOUNTER — Encounter: Payer: Self-pay | Admitting: Internal Medicine

## 2023-09-25 ENCOUNTER — Ambulatory Visit (INDEPENDENT_AMBULATORY_CARE_PROVIDER_SITE_OTHER): Admitting: Internal Medicine

## 2023-09-25 VITALS — BP 126/80 | HR 63 | Temp 98.3°F | Ht 63.0 in | Wt 171.0 lb

## 2023-09-25 DIAGNOSIS — F411 Generalized anxiety disorder: Secondary | ICD-10-CM

## 2023-09-25 DIAGNOSIS — Z7901 Long term (current) use of anticoagulants: Secondary | ICD-10-CM | POA: Diagnosis not present

## 2023-09-25 DIAGNOSIS — I10 Essential (primary) hypertension: Secondary | ICD-10-CM

## 2023-09-25 DIAGNOSIS — D682 Hereditary deficiency of other clotting factors: Secondary | ICD-10-CM | POA: Diagnosis not present

## 2023-09-25 DIAGNOSIS — S20219A Contusion of unspecified front wall of thorax, initial encounter: Secondary | ICD-10-CM | POA: Insufficient documentation

## 2023-09-25 DIAGNOSIS — S20211A Contusion of right front wall of thorax, initial encounter: Secondary | ICD-10-CM | POA: Diagnosis not present

## 2023-09-25 LAB — POCT INR: INR: 2.4 (ref 2.0–3.0)

## 2023-09-25 MED ORDER — VALSARTAN 320 MG PO TABS
320.0000 mg | ORAL_TABLET | Freq: Every day | ORAL | 3 refills | Status: AC
Start: 2023-09-25 — End: ?

## 2023-09-25 NOTE — Assessment & Plan Note (Signed)
 Better Pt fell at the [facility] - R chest wall contusion - 09/08/23

## 2023-09-25 NOTE — Assessment & Plan Note (Signed)
 On Coumadin for h/o DVT x 40 years

## 2023-09-25 NOTE — Progress Notes (Signed)
 Subjective:  Patient ID: [name] Little, female    DOB: 05/23/1933  Age: 89 y.o. MRN: [id number]  CC: Medical Management of Chronic Issues (6 week f/u )   HPI [name] Little presents for HTN Pt fell at the [facility] - R chest wall contusion - 09/08/23 C/o stress w/her 79 yo husband w/dementia  Outpatient Medications Prior to Visit  Medication Sig Dispense Refill   albuterol  (VENTOLIN  HFA) 108 (90 Base) MCG/ACT inhaler Inhale 2 puffs into the lungs every 6 (six) hours as needed. 18 g 0   B Complex Vitamins (VITAMIN B COMPLEX PO) Take 1 tablet by mouth daily.     Cranberry 1000 MG CAPS Take 1 capsule by mouth 2 (two) times daily.     furosemide  (LASIX ) 20 MG tablet Take 1 tablet (20 mg total) by mouth daily. 90 tablet 3   metroNIDAZOLE  (METROCREAM ) 0.75 % cream Apply 1 Application topically daily as needed (Irritation).     Nebivolol  HCl 20 MG TABS Take by mouth.     predniSONE  (DELTASONE ) 20 MG tablet Take 1 tablet (20 mg total) by mouth daily with breakfast. 5 tablet 0   warfarin (COUMADIN ) 5 MG tablet TAKE 1 TABLET BY MOUTH DAILY EXCEPT TAKE 1/2 TABLET ON TUESDAYS, THURSDAY, SATURDAYS OR AS DIRECTED BY ANTICOAGULATION CLINIC 100 tablet 1   valsartan  (DIOVAN ) 320 MG tablet Take 1 tablet (320 mg total) by mouth daily. Pt needs office visit for further refills. 30 tablet 0   No facility-administered medications prior to visit.    ROS: Review of Systems  Constitutional:  Negative for activity change, appetite change, chills, fatigue and unexpected weight change.  HENT:  Negative for congestion, mouth sores and sinus pressure.   Eyes:  Negative for visual disturbance.  Respiratory:  Negative for cough and chest tightness.   Gastrointestinal:  Negative for abdominal pain and nausea.  Genitourinary:  Negative for difficulty urinating, frequency and vaginal pain.  Musculoskeletal:  Positive for arthralgias. Negative for back pain and gait problem.  Skin:  Negative for  pallor and rash.  Neurological:  Negative for dizziness, tremors, weakness, numbness and headaches.  Psychiatric/Behavioral:  Negative for confusion, sleep disturbance and suicidal ideas. The patient is nervous/anxious.     Objective:  BP 126/80   Pulse 63   Temp 98.3 F (36.8 C) (Oral)   Ht 5' 3 (1.6 m)   Wt 171 lb (77.6 kg)   SpO2 96%   BMI 30.29 kg/m   BP Readings from Last 3 Encounters:  09/25/23 126/80  08/14/23 130/70  06/08/23 (!) 176/88    Wt Readings from Last 3 Encounters:  09/25/23 171 lb (77.6 kg)  08/14/23 175 lb (79.4 kg)  07/31/23 169 lb (76.7 kg)    Physical Exam Constitutional:      General: She is not in acute distress.    Appearance: She is well-developed. She is obese.  HENT:     Head: Normocephalic.     Right Ear: External ear normal.     Left Ear: External ear normal.     Nose: Nose normal.  Eyes:     General:        Right eye: No discharge.        Left eye: No discharge.     Conjunctiva/sclera: Conjunctivae normal.     Pupils: Pupils are equal, round, and reactive to light.  Neck:     Thyroid : No thyromegaly.     Vascular: No JVD.  Trachea: No tracheal deviation.  Cardiovascular:     Rate and Rhythm: Normal rate and regular rhythm.     Heart sounds: Normal heart sounds.  Pulmonary:     Effort: No respiratory distress.     Breath sounds: No stridor. No wheezing.  Abdominal:     General: Bowel sounds are normal. There is no distension.     Palpations: Abdomen is soft. There is no mass.     Tenderness: There is no abdominal tenderness. There is no guarding or rebound.  Musculoskeletal:        General: Tenderness present.     Cervical back: Normal range of motion and neck supple. No rigidity.  Lymphadenopathy:     Cervical: No cervical adenopathy.  Skin:    Findings: No erythema or rash.  Neurological:     Cranial Nerves: No cranial nerve deficit.     Motor: No abnormal muscle tone.     Coordination: Coordination normal.      Deep Tendon Reflexes: Reflexes normal.  Psychiatric:        Behavior: Behavior normal.        Thought Content: Thought content normal.        Judgment: Judgment normal.    R chest wall is sensitive, no brise Lab Results  Component Value Date   WBC 11.5 (H) 05/08/2023   HGB 13.9 05/08/2023   HCT 43.0 05/08/2023   PLT 303 05/08/2023   GLUCOSE 103 (H) 08/14/2023   CHOL 154 04/24/2022   TRIG 167.0 (H) 04/24/2022   HDL 34.40 (L) 04/24/2022   LDLDIRECT 144.0 02/09/2022   LDLCALC 86 04/24/2022   ALT 11 08/14/2023   AST 18 08/14/2023   NA 141 08/14/2023   K 4.0 08/14/2023   CL 105 08/14/2023   CREATININE 0.74 08/14/2023   BUN 20 08/14/2023   CO2 30 08/14/2023   TSH 3.93 08/14/2023   INR 1.9 (A) 08/28/2023   HGBA1C 7.0 (H) 08/14/2023    VAS US  LOWER EXTREMITY VENOUS (DVT) Result Date: 05/09/2023  Lower Venous DVT Study Patient Name:  JAYDON SOROKA [facility]  Date of Exam:   05/09/2023 Medical Rec #: [id number]             Accession #:    [id number] Date of Birth: 11-01-1933            Patient Gender: F Patient Age:   67 years Exam Location:  [facility] Procedure:      VAS US  LOWER EXTREMITY VENOUS (DVT) Referring Phys: RAYNELL NAEGELI --------------------------------------------------------------------------------  Indications: Pain.  Risk Factors: Hx of PE DVT Hx past pregnancy and obesity. Limitations: Body habitus. Comparison Study: No significant changes seen since previous exam 11/16/23. Performing Technologist: Garnette Rockers  Examination Guidelines: A complete evaluation includes B-mode imaging, spectral Doppler, color Doppler, and power Doppler as needed of all accessible portions of each vessel. Bilateral testing is considered an integral part of a complete examination. Limited examinations for reoccurring indications may be performed as noted. The reflux portion of the exam is performed with the patient in reverse Trendelenburg.   +---------+---------------+---------+-----------+----------+-------------------+ RIGHT    CompressibilityPhasicitySpontaneityPropertiesThrombus Aging      +---------+---------------+---------+-----------+----------+-------------------+ CFV      Full           Yes      Yes                                      +---------+---------------+---------+-----------+----------+-------------------+  SFJ      Full                                                             +---------+---------------+---------+-----------+----------+-------------------+ FV Prox  Full                                                             +---------+---------------+---------+-----------+----------+-------------------+ FV Mid   Full                                                             +---------+---------------+---------+-----------+----------+-------------------+ FV DistalFull                                                             +---------+---------------+---------+-----------+----------+-------------------+ PFV      Full                                                             +---------+---------------+---------+-----------+----------+-------------------+ POP      Full           Yes      Yes                                      +---------+---------------+---------+-----------+----------+-------------------+ PTV      Full                    Yes                                      +---------+---------------+---------+-----------+----------+-------------------+ PERO     Full                    Yes                  Not well visualized +---------+---------------+---------+-----------+----------+-------------------+   +---------+---------------+---------+-----------+----------+-------------------+ LEFT     CompressibilityPhasicitySpontaneityPropertiesThrombus Aging      +---------+---------------+---------+-----------+----------+-------------------+  CFV      Full           Yes      Yes                                      +---------+---------------+---------+-----------+----------+-------------------+ SFJ      Full                                                             +---------+---------------+---------+-----------+----------+-------------------+  FV Prox  Full                                                             +---------+---------------+---------+-----------+----------+-------------------+ FV Mid   Full                                                             +---------+---------------+---------+-----------+----------+-------------------+ FV DistalFull                                                             +---------+---------------+---------+-----------+----------+-------------------+ PFV      Full                                                             +---------+---------------+---------+-----------+----------+-------------------+ POP      Full           Yes      Yes                                      +---------+---------------+---------+-----------+----------+-------------------+ PTV      Full                    Yes                                      +---------+---------------+---------+-----------+----------+-------------------+ PERO     Full                    Yes                  Not well visualized +---------+---------------+---------+-----------+----------+-------------------+     Summary: BILATERAL: - No evidence of deep vein thrombosis seen in the lower extremities, bilaterally. -No evidence of popliteal cyst, bilaterally.   *See table(s) above for measurements and observations. Electronically signed by Lonni Gaskins MD on 05/09/2023 at 11:53:35 AM.    Final    DG Chest 2 View Result Date: 05/09/2023 CLINICAL DATA:  Lower extremity swelling and redness EXAM: CHEST - 2 VIEW COMPARISON:  08/02/2022 FINDINGS: Mild cardiomegaly with aortic atherosclerosis.  Patchy atelectasis left lung base. No significant pleural effusion. IMPRESSION: Mild cardiomegaly. Patchy atelectasis left lung base. Electronically Signed   By: Luke Bun M.D.   On: 05/09/2023 00:48    Assessment & Plan:   Problem List Items Addressed This Visit     Anticoagulant long-term use   On Coumadin  for h/o DVT x 40 years      Anxiety state   Chronic stress w/her 66 yo husband w/dementia Not on Rx  Chest wall contusion - Primary   Better Pt fell at the [facility] - R chest wall contusion - 09/08/23      Factor V deficiency (HCC)   On Coumadin  for h/o DVT x 40 years      Other Visit Diagnoses       Hypertension, unspecified type       Relevant Medications   Nebivolol  HCl 20 MG TABS   valsartan  (DIOVAN ) 320 MG tablet         Meds ordered this encounter  Medications   valsartan  (DIOVAN ) 320 MG tablet    Sig: Take 1 tablet (320 mg total) by mouth daily. Pt needs office visit for further refills.    Dispense:  90 tablet    Refill:  3      Follow-up: Return in about 3 months (around 12/26/2023) for a follow-up visit.  Marolyn Noel, MD

## 2023-09-25 NOTE — Progress Notes (Cosign Needed Addendum)
 Pt also has PCP apt today.  Pt had a fall since last apt. She reports bruised ribs but not bleeding. She was not seen in office for this.  Continue 1 tablet daily except take 1/2 tablet on Tuesdays, Thursdays and Saturdays. Recheck in 6 weeks.  Medical screening examination/treatment/procedure(s) were performed by non-physician practitioner and as supervising physician I was immediately available for consultation/collaboration.  I agree with above. Karlynn Noel, MD

## 2023-09-25 NOTE — Patient Instructions (Addendum)
Pre visit review using our clinic review tool, if applicable. No additional management support is needed unless otherwise documented below in the visit note.  Continue 1 tablet daily except take 1/2 tablet on Tuesdays, Thursdays and Saturdays. Recheck in 6 weeks. 

## 2023-09-25 NOTE — Assessment & Plan Note (Signed)
 Chronic stress w/her 88 yo husband w/dementia Not on Rx

## 2023-10-11 DIAGNOSIS — I872 Venous insufficiency (chronic) (peripheral): Secondary | ICD-10-CM | POA: Diagnosis not present

## 2023-10-11 DIAGNOSIS — L821 Other seborrheic keratosis: Secondary | ICD-10-CM | POA: Diagnosis not present

## 2023-10-11 DIAGNOSIS — L538 Other specified erythematous conditions: Secondary | ICD-10-CM | POA: Diagnosis not present

## 2023-10-11 DIAGNOSIS — L820 Inflamed seborrheic keratosis: Secondary | ICD-10-CM | POA: Diagnosis not present

## 2023-10-11 DIAGNOSIS — L570 Actinic keratosis: Secondary | ICD-10-CM | POA: Diagnosis not present

## 2023-10-11 DIAGNOSIS — Z08 Encounter for follow-up examination after completed treatment for malignant neoplasm: Secondary | ICD-10-CM | POA: Diagnosis not present

## 2023-10-11 DIAGNOSIS — Z86006 Personal history of melanoma in-situ: Secondary | ICD-10-CM | POA: Diagnosis not present

## 2023-10-11 DIAGNOSIS — L814 Other melanin hyperpigmentation: Secondary | ICD-10-CM | POA: Diagnosis not present

## 2023-10-11 DIAGNOSIS — C4359 Malignant melanoma of other part of trunk: Secondary | ICD-10-CM | POA: Diagnosis not present

## 2023-10-12 ENCOUNTER — Other Ambulatory Visit: Payer: Self-pay | Admitting: Internal Medicine

## 2023-10-12 DIAGNOSIS — Z1231 Encounter for screening mammogram for malignant neoplasm of breast: Secondary | ICD-10-CM

## 2023-11-06 ENCOUNTER — Ambulatory Visit

## 2023-11-06 DIAGNOSIS — Z23 Encounter for immunization: Secondary | ICD-10-CM

## 2023-11-06 DIAGNOSIS — Z7901 Long term (current) use of anticoagulants: Secondary | ICD-10-CM

## 2023-11-06 LAB — POCT INR: INR: 2.4 (ref 2.0–3.0)

## 2023-11-06 NOTE — Patient Instructions (Addendum)
Pre visit review using our clinic review tool, if applicable. No additional management support is needed unless otherwise documented below in the visit note.  Continue 1 tablet daily except take 1/2 tablet on Tuesdays, Thursdays and Saturdays. Recheck in 6 weeks. 

## 2023-11-06 NOTE — Progress Notes (Addendum)
 Continue 1 tablet daily except take 1/2 tablet on Tuesdays, Thursdays and Saturdays. Recheck in 6 weeks. Pt requested high dose flu vaccine. Vaccine administered. Pt tolerated well.   Medical screening examination/treatment/procedure(s) were performed by non-physician practitioner and as supervising physician I was immediately available for consultation/collaboration.  I agree with above. Karlynn Noel, MD

## 2023-11-07 ENCOUNTER — Telehealth: Payer: Self-pay

## 2023-11-07 NOTE — Telephone Encounter (Signed)
 Pt LVM with coumadin  clinic reporting she lost her Medicare card and is requesting it be sent to her via text.   Tried to contact pt 3 times and it is saying your call cannot be completed at this time, try your call again later.

## 2023-11-07 NOTE — Telephone Encounter (Signed)
 Tried to contact pt but getting the same msg.

## 2023-11-08 NOTE — Telephone Encounter (Signed)
 Tried to contact pt again but no answer and same msg is played.

## 2023-11-13 ENCOUNTER — Ambulatory Visit: Admitting: Family Medicine

## 2023-11-13 ENCOUNTER — Encounter: Payer: Self-pay | Admitting: Family Medicine

## 2023-11-13 VITALS — BP 170/98 | HR 98 | Temp 98.2°F | Ht 63.0 in | Wt 170.4 lb

## 2023-11-13 DIAGNOSIS — H938X2 Other specified disorders of left ear: Secondary | ICD-10-CM | POA: Diagnosis not present

## 2023-11-13 NOTE — Telephone Encounter (Signed)
 Pt was seen in clinic today for possible foreign body in ear. She requested to speak to this nurse but could not wait. She requested this nurse contact her.  LVM for pt to return call.

## 2023-11-13 NOTE — Patient Instructions (Signed)
 Your ear looks great, there is no cotton remaining  Follow-up with me for new or worsening symptoms.

## 2023-11-13 NOTE — Progress Notes (Signed)
   Acute Office Visit  Subjective:     Patient ID: [name] Little, female    DOB: [birth year], 89 y.o.   MRN: [id number]  Chief Complaint  Patient presents with   Acute Visit    L ear, was cleaning ear on Sunday after a shower with a Q-Tip. Cotton piece stuck in ear    HPI  Discussed the use of AI scribe software for clinical note transcription with the patient, who gave verbal consent to proceed.  History of Present Illness [name] Little is an 89 year old female who presents with concerns about potential cotton left in her left ear after cleaning.  Foreign body sensation in left ear - Concern for retained cotton in left ear following self-cleaning on Sunday - Attempted removal with ear wash syringe, uncertain if successful - No subsequent irritation or discomfort since the incident     ROS Per HPI      Objective:    BP (!) 170/98 (BP Location: Left Arm, Patient Position: Sitting)   Pulse 98   Temp 98.2 F (36.8 C) (Temporal)   Ht 5' 3 (1.6 m)   Wt 170 lb 6.4 oz (77.3 kg)   SpO2 100%   BMI 30.19 kg/m    Physical Exam Vitals and nursing note reviewed.  Constitutional:      General: She is not in acute distress. HENT:     Head: Normocephalic and atraumatic.     Right Ear: External ear normal.     Left Ear: External ear normal.     Nose: Nose normal.     Mouth/Throat:     Mouth: Mucous membranes are moist.     Pharynx: Oropharynx is clear.  Eyes:     Extraocular Movements: Extraocular movements intact.     Pupils: Pupils are equal, round, and reactive to light.  Neck:     Vascular: No carotid bruit.  Cardiovascular:     Rate and Rhythm: Normal rate and regular rhythm.  Pulmonary:     Effort: Pulmonary effort is normal.  Musculoskeletal:     Cervical back: Normal range of motion.  Lymphadenopathy:     Cervical: No cervical adenopathy.  Neurological:     General: No focal deficit present.     Mental Status: She is alert and oriented to  person, place, and time.  Psychiatric:        Mood and Affect: Mood normal.        Thought Content: Thought content normal.     No results found for any visits on 11/13/23.      Assessment & Plan:   Assessment and Plan Assessment & Plan Abnormal sensation in the left ear No foreign body or significant wax in left ear. No irritation noted. - Reassured regarding ear examination findings.      No orders of the defined types were placed in this encounter.    No orders of the defined types were placed in this encounter.   No follow-ups on file.  [name] LITTIE Ku, FNP

## 2023-11-14 ENCOUNTER — Encounter

## 2023-11-14 NOTE — Progress Notes (Cosign Needed Addendum)
 This encounter was created in error - please disregard.  Pt rescheduled AWV appt  Medical screening examination/treatment/procedure(s) were performed by non-physician practitioner and as supervising physician I was immediately available for consultation/collaboration.  I agree with above. Karlynn Noel, MD

## 2023-11-14 NOTE — Telephone Encounter (Signed)
 Tried to contact pt again but still getting the same message.

## 2023-11-19 ENCOUNTER — Ambulatory Visit (INDEPENDENT_AMBULATORY_CARE_PROVIDER_SITE_OTHER)

## 2023-11-19 VITALS — Ht 63.0 in | Wt 170.0 lb

## 2023-11-19 DIAGNOSIS — Z780 Asymptomatic menopausal state: Secondary | ICD-10-CM

## 2023-11-19 DIAGNOSIS — Z0000 Encounter for general adult medical examination without abnormal findings: Secondary | ICD-10-CM

## 2023-11-19 NOTE — Patient Instructions (Addendum)
 [name] Little,  Thank you for taking the time for your Medicare Wellness Visit. I appreciate your continued commitment to your health goals. Please review the care plan we discussed, and feel free to reach out if I can assist you further.  Medicare recommends these wellness visits once per year to help you and your care team stay ahead of potential health issues. These visits are designed to focus on prevention, allowing your provider to concentrate on managing your acute and chronic conditions during your regular appointments.  Please note that Annual Wellness Visits do not include a physical exam. Some assessments may be limited, especially if the visit was conducted virtually. If needed, we may recommend a separate in-person follow-up with your provider.  Ongoing Care Seeing your primary care provider every 3 to 6 months helps us  monitor your health and provide consistent, personalized care.   Referrals If a referral was made during today's visit and you haven't received any updates within two weeks, please contact the referred provider directly to check on the status.  Recommended Screenings:  Health Maintenance  Topic Date Due   DEXA scan (bone density measurement)  04/25/2018   Complete foot exam   07/06/2021   Eye exam for diabetics  12/09/2022   COVID-19 Vaccine (4 - 2025-26 season) 10/01/2023   Breast Cancer Screening  12/05/2023   Hemoglobin A1C  02/14/2024   Medicare Annual Wellness Visit  11/18/2024   DTaP/Tdap/Td vaccine (4 - Td or Tdap) 10/19/2027   Pneumococcal Vaccine for age over 36  Completed   Flu Shot  Completed   Zoster (Shingles) Vaccine  Completed   Meningitis B Vaccine  Aged Out       11/19/2023    3:53 PM  Advanced Directives  Does Patient Have a Medical Advance Directive? No  Would patient like information on creating a medical advance directive? No - Patient declined   Advance Care Planning is important because it: Ensures you receive medical care that  aligns with your values, goals, and preferences. Provides guidance to your family and loved ones, reducing the emotional burden of decision-making during critical moments.  Vision: Annual vision screenings are recommended for early detection of glaucoma, cataracts, and diabetic retinopathy. These exams can also reveal signs of chronic conditions such as diabetes and high blood pressure.  Dental: Annual dental screenings help detect early signs of oral cancer, gum disease, and other conditions linked to overall health, including heart disease and diabetes.

## 2023-11-19 NOTE — Progress Notes (Signed)
 Subjective:  Please attest and cosign this visit due to patients primary care provider not being in the office at the time the visit was completed.  (Pt of Dr A. Plotnikov)   [name] Little is a 89 y.o. who presents for a Medicare Wellness preventive visit.  As a reminder, Annual Wellness Visits don't include a physical exam, and some assessments may be limited, especially if this visit is performed virtually. We may recommend an in-person follow-up visit with your provider if needed.  Visit Complete: Virtual I connected with  [name] Little on 11/19/23 by a audio enabled telemedicine application and verified that I am speaking with the correct person using two identifiers.  Patient Location: Home  Provider Location: Office/Clinic  I discussed the limitations of evaluation and management by telemedicine. The patient expressed understanding and agreed to proceed.  Vital Signs: Because this visit was a virtual/telehealth visit, some criteria may be missing or patient reported. Any vitals not documented were not able to be obtained and vitals that have been documented are patient reported.  VideoDeclined- This patient declined Librarian, academic. Therefore the visit was completed with audio only.  Persons Participating in Visit: Patient.  AWV Questionnaire: No: Patient Medicare AWV questionnaire was not completed prior to this visit.  Cardiac Risk Factors include: advanced age (>1men, >48 women);diabetes mellitus;dyslipidemia;hypertension;obesity (BMI >30kg/m2)     Objective:    Today's Vitals   11/19/23 1553  Weight: 170 lb (77.1 kg)  Height: 5' 3 (1.6 m)   Body mass index is 30.11 kg/m.     11/19/2023    3:53 PM 05/08/2023    9:52 PM 11/03/2022   10:52 PM 03/16/2021   12:10 PM 02/25/2020   10:41 AM 01/28/2020    9:00 AM 03/10/2019    8:12 AM  Advanced Directives  Does Patient Have a Medical Advance Directive? No No No No Yes No No   Type of Advance Directive     Healthcare Power of Attorney    Would patient like information on creating a medical advance directive? No - Patient declined No - Patient declined No - Patient declined   No - Patient declined No - Patient declined    Current Medications (verified) Outpatient Encounter Medications as of 11/19/2023  Medication Sig   albuterol  (VENTOLIN  HFA) 108 (90 Base) MCG/ACT inhaler Inhale 2 puffs into the lungs every 6 (six) hours as needed.   B Complex Vitamins (VITAMIN B COMPLEX PO) Take 1 tablet by mouth daily.   Cranberry 1000 MG CAPS Take 1 capsule by mouth 2 (two) times daily.   furosemide  (LASIX ) 20 MG tablet Take 1 tablet (20 mg total) by mouth daily.   Nebivolol  HCl 20 MG TABS Take by mouth.   valsartan  (DIOVAN ) 320 MG tablet Take 1 tablet (320 mg total) by mouth daily. Pt needs office visit for further refills.   warfarin (COUMADIN ) 5 MG tablet TAKE 1 TABLET BY MOUTH DAILY EXCEPT TAKE 1/2 TABLET ON TUESDAYS, THURSDAY, SATURDAYS OR AS DIRECTED BY ANTICOAGULATION CLINIC   metroNIDAZOLE  (METROCREAM ) 0.75 % cream Apply 1 Application topically daily as needed (Irritation). (Patient not taking: Reported on 11/19/2023)   predniSONE  (DELTASONE ) 20 MG tablet Take 1 tablet (20 mg total) by mouth daily with breakfast. (Patient not taking: Reported on 11/19/2023)   No facility-administered encounter medications on file as of 11/19/2023.    Allergies (verified) Amlodipine , Carvedilol , Chlorthalidone , Hydralazine , Statins, Zetia  [ezetimibe ], Cefdinir, Lyrica  [pregabalin ], Nebivolol , Cefuroxime axetil, and Sulfonamide derivatives  History: Past Medical History:  Diagnosis Date   Anticoagulated on Coumadin     long-term for factor V---  managed by coumadin  clinic and pcp   Diet-controlled type 2 diabetes mellitus (HCC)    followed by pcp   Factor V deficiency (HCC) 1998   anticoagulated on coumadin    GAD (generalized anxiety disorder)    Heart murmur    Hemorrhoids     History of avascular necrosis of capital femoral epiphysis 1999   s/p THA right   History of basal cell carcinoma (BCC) excision    scalp/ neck   History of Clostridium difficile infection    History of DVT of lower extremity 1998;  04/ 2006   LLE   History of external beam radiation therapy 2010   right breast cancer   History of ITP    per pt hx long-term use prednisone  for ITP,  1998 s/p total splenctomy   History of kidney stones    History of melanoma 1980s   s/p  right calf melanoma excision , per pt not malignant and localized ,  no recurrence   History of paroxysmal supraventricular tachycardia    (02-23-2020  pt stated have not had any issues / symptoms in several years   History of pulmonary embolus (PE) 04/2004   History of right breast cancer 2010   08-10-2008 s/p  right lumpectomy w/ sln bx (partial mastectomy) , ER+, Stage I,  completed radiation 2010,  per pt no recurrance   Hypertension    Macular degeneration, bilateral    mild   Mitral valve regurgitation 09/01/2016   cardiology-- dr delford--- per note mild to moderate without stenosis,     Mixed hyperlipidemia 05/18/2015   Nocturia    OA (osteoarthritis)    lumbar stenosis, radiculopathy, OA- L hip   Personal history of colonic polyps    hyperplastic   Renal calculus, right    Right ureteral calculus    Thoracic ascending aortic aneurysm    last CT angio chest in epic 11-29-2016  , 3.7cm   Past Surgical History:  Procedure Laterality Date   ABDOMINAL HYSTERECTOMY  1973   BREAST LUMPECTOMY WITH RADIOACTIVE SEED AND SENTINEL LYMPH NODE BIOPSY Right 08/10/2008   @[facility]    CATARACT EXTRACTION W/ INTRAOCULAR LENS  IMPLANT, BILATERAL Bilateral 2001   CYSTOSCOPY WITH RETROGRADE PYELOGRAM, URETEROSCOPY AND STENT PLACEMENT Right 02/25/2020   Procedure: CYSTOSCOPY WITH RETROGRADE PYELOGRAM, URETEROSCOPY AND STENT EXCHANGE;  Surgeon: Alvaro Hummer, MD;  Location:  [facility];  Service: Urology;   Laterality: Right;  75 MINS   CYSTOSCOPY WITH STENT PLACEMENT Right 01/27/2020   Procedure: CYSTOSCOPY WITH STENT PLACEMENT;  Surgeon: Alvaro Hummer, MD;  Location: [facility];  Service: Urology;  Laterality: Right;   CYSTOSCOPY/URETEROSCOPY/HOLMIUM LASER/STENT PLACEMENT Left 06-27-2018  dr janit,  @[facility]    HOLMIUM LASER APPLICATION Right 02/25/2020   Procedure: HOLMIUM LASER APPLICATION;  Surgeon: Alvaro Hummer, MD;  Location: [facility];  Service: Urology;  Laterality: Right;   I & D EXTREMITY Right 10/26/2017   Procedure: IRRIGATION AND DEBRIDEMENT RIGHT HAND;  Surgeon: Shari Easter, MD;  Location[facility];  Service: Orthopedics;  Laterality: Right;   LUMBAR LAMINECTOMY/DECOMPRESSION MICRODISCECTOMY  04/11/2011   Procedure: LUMBAR LAMINECTOMY/DECOMPRESSION MICRODISCECTOMY;  Surgeon: Victory Gens, MD;  Location: [facility];  Service: Neurosurgery;  Laterality: N/A;  Lumbar Five-Sacral One Microdiscectomy   MELANOMA EXCISION Right 1980s   right calf   SPLENECTOMY, TOTAL  1998   TOTAL HIP ARTHROPLASTY Right 1999  UPPER GASTROINTESTINAL ENDOSCOPY     Family History  Problem Relation Age of Onset   Heart disease Mother    Heart disease Father    Alzheimer's disease Sister    Dementia Sister    Dementia Sister    Stroke Sister    Dementia Sister        mean, carotid artery disease   Clotting disorder Maternal Uncle    Breast cancer Paternal Aunt    Cancer Paternal Aunt        breast   Heart disease Other        uncles   Colon cancer Neg Hx    Anesthesia problems Neg Hx    Hypotension Neg Hx    Malignant hyperthermia Neg Hx    Pseudochol deficiency Neg Hx    Esophageal cancer Neg Hx    Rectal cancer Neg Hx    Stomach cancer Neg Hx    Social History   Socioeconomic History   Marital status: Married    Spouse name: Not on file   Number of children: 4   Years of education: Not on file   Highest education level: Not on file  Occupational History    Occupation: Retired  Tobacco Use   Smoking status: Never   Smokeless tobacco: Never  Vaping Use   Vaping status: Never Used  Substance and Sexual Activity   Alcohol use: No    Alcohol/week: 0.0 standard drinks of alcohol   Drug use: Never   Sexual activity: Not Currently    Partners: Male  Other Topics Concern   Not on file  Social History Narrative   Married 1955   2 sons - '59, '61, 2 daughters '55, '57; 8 grandchildren; 1 great grand   I-ADLs      Social Drivers of Corporate investment banker Strain: Low Risk  (11/19/2023)   Overall Financial Resource Strain (CARDIA)    Difficulty of Paying Living Expenses: Not hard at all  Food Insecurity: No Food Insecurity (11/19/2023)   Hunger Vital Sign    Worried About Running Out of Food in the Last Year: Never true    Ran Out of Food in the Last Year: Never true  Transportation Needs: No Transportation Needs (11/19/2023)   PRAPARE - Administrator, Civil Service (Medical): No    Lack of Transportation (Non-Medical): No  Physical Activity: Insufficiently Active (11/19/2023)   Exercise Vital Sign    Days of Exercise per Week: 3 days    Minutes of Exercise per Session: 20 min  Stress: No Stress Concern Present (11/19/2023)   [employer] of Occupational Health - Occupational Stress Questionnaire    Feeling of Stress: Not at all  Social Connections: Socially Integrated (11/19/2023)   Social Connection and Isolation Panel    Frequency of Communication with Friends and Family: More than three times a week    Frequency of Social Gatherings with Friends and Family: More than three times a week    Attends Religious Services: More than 4 times per year    Active Member of [employer] or Organizations: Yes    Attends Engineer, structural: More than 4 times per year    Marital Status: Married    Tobacco Counseling Counseling given: Not Answered    Clinical Intake:  Pre-visit preparation completed: Yes  Pain  : No/denies pain     BMI - recorded: 30.11 Nutritional Status: BMI > 30  Obese Nutritional Risks: None Diabetes: Yes CBG done?:  No Did pt. bring in CBG monitor from home?: No  Lab Results  Component Value Date   HGBA1C 7.0 (H) 08/14/2023   HGBA1C 6.7 (H) 04/24/2022   HGBA1C 6.6 (H) 02/09/2022     How often do you need to have someone help you when you read instructions, pamphlets, or other written materials from your doctor or pharmacy?: 1 - Never  Interpreter Needed?: No  Information entered by :: Verdie Saba, CMA   Activities of Daily Living     11/19/2023    3:56 PM  In your present state of health, do you have any difficulty performing the following activities:  Hearing? 0  Vision? 0  Difficulty concentrating or making decisions? 0  Walking or climbing stairs? 0  Dressing or bathing? 0  Doing errands, shopping? 0  Preparing Food and eating ? N  Using the Toilet? N  In the past six months, have you accidently leaked urine? Y  Comment wears a pad  Do you have problems with loss of bowel control? N  Managing your Medications? N  Managing your Finances? N  Housekeeping or managing your Housekeeping? N    Patient Care Team: Plotnikov, Karlynn GAILS, MD as PCP - General (Internal Medicine) Delford Maude BROCKS, MD as PCP - Cardiology (Cardiology) Elner Arley LABOR, MD as Consulting Physician (Ophthalmology) Alvaro Ricardo KATHEE Raddle., MD as Consulting Physician (Urology) Roz Anes, MD as Consulting Physician (Ophthalmology) Ivin Kocher, MD as Referring Physician (Dermatology)  I have updated your Care Teams any recent Medical Services you may have received from other providers in the past year.     Assessment:   This is a routine wellness examination for [name] Little.  Hearing/Vision screen Hearing Screening - Comments:: Denies hearing difficulties   Vision Screening - Comments:: Wears rx glasses - up to date with routine eye exams with Dr Elner   Goals Addressed                This Visit's Progress     Patient Stated (pt-stated)        Patient stated she continues to watch her diet       Depression Screen     11/19/2023    3:57 PM 11/13/2023    1:57 PM 09/25/2023   10:56 AM 05/11/2023   10:47 AM 03/27/2023   10:22 AM 02/19/2023   10:49 AM 04/24/2022   10:02 AM  PHQ 2/9 Scores  PHQ - 2 Score 0 3 0 0 0 0 0  PHQ- 9 Score 1 6         Fall Risk     11/19/2023    3:56 PM 09/25/2023   11:06 AM 09/25/2023   10:56 AM 05/11/2023   10:47 AM 03/27/2023   10:22 AM  Fall Risk   Falls in the past year? 0 1 0 0 0  Number falls in past yr: 0 0 0 0 0  Injury with Fall? 0 1 0 0 0  Risk for fall due to : No Fall Risks History of fall(s) No Fall Risks No Fall Risks No Fall Risks  Follow up Falls evaluation completed;Falls prevention discussed Falls evaluation completed Falls evaluation completed Falls evaluation completed Falls evaluation completed    MEDICARE RISK AT HOME:  Medicare Risk at Home Any stairs in or around the home?: Yes If so, are there any without handrails?: No Home free of loose throw rugs in walkways, pet beds, electrical cords, etc?: Yes Adequate lighting in your home to reduce  risk of falls?: Yes Life alert?: No Use of a cane, walker or w/c?: No Grab bars in the bathroom?: Yes Shower chair or bench in shower?: No Elevated toilet seat or a handicapped toilet?: No  TIMED UP AND GO:  Was the test performed?  No  Cognitive Function: 6CIT completed    01/29/2018    9:14 AM 04/17/2016    9:19 AM  MMSE - Mini Mental State Exam  Orientation to time 5 5   Orientation to Place 5 5   Registration 3 3   Attention/ Calculation 4 5   Recall 1 2   Language- name 2 objects 2 2   Language- repeat 1 1  Language- follow 3 step command 3 3   Language- read & follow direction 1 1   Write a sentence 1 1   Copy design 1 1   Total score 27 29      Data saved with a previous flowsheet row definition        11/19/2023    3:58 PM   6CIT Screen  What Year? 0 points  What month? 0 points  What time? 0 points  Count back from 20 0 points  Months in reverse 0 points  Repeat phrase 4 points  Total Score 4 points    Immunizations Immunization History  Administered Date(s) Administered   Fluad Quad(high Dose 65+) 10/24/2018, 10/30/2019, 11/08/2020, 11/07/2021   Fluad Trivalent(High Dose 65+) 12/26/2022   INFLUENZA, HIGH DOSE SEASONAL PF 10/19/2015, 10/16/2016, 11/22/2017, 11/06/2023   Influenza Split 11/14/2010   Influenza Whole 11/08/2005, 10/28/2007, 11/23/2008, 11/26/2009   Influenza,inj,Quad PF,6+ Mos 11/26/2012, 09/22/2013, 10/20/2014   PFIZER(Purple Top)SARS-COV-2 Vaccination 02/20/2019, 03/13/2019, 11/26/2019   Pneumococcal Conjugate-13 04/01/2013   Pneumococcal Polysaccharide-23 10/30/2001, 07/27/2009, 07/06/2020   Td 11/27/2001   Tdap 10/18/2017   Tetanus 04/01/2013   Zoster Recombinant(Shingrix) 03/13/2017, 09/02/2018    Screening Tests Health Maintenance  Topic Date Due   DEXA SCAN  04/25/2018   FOOT EXAM  07/06/2021   OPHTHALMOLOGY EXAM  12/09/2022   COVID-19 Vaccine (4 - 2025-26 season) 10/01/2023   Mammogram  12/05/2023   HEMOGLOBIN A1C  02/14/2024   Medicare Annual Wellness (AWV)  11/18/2024   DTaP/Tdap/Td (4 - Td or Tdap) 10/19/2027   Pneumococcal Vaccine: 50+ Years  Completed   Influenza Vaccine  Completed   Zoster Vaccines- Shingrix  Completed   Meningococcal B Vaccine  Aged Out    Health Maintenance Items Addressed: 11/19/2023   Additional Screening:  Vision Screening: Recommended annual ophthalmology exams for early detection of glaucoma and other disorders of the eye. Is the patient up to date with their annual eye exam?  Yes  Who is the provider or what is the name of the office in which the patient attends annual eye exams? [facility]  Dental Screening: Recommended annual dental exams for proper oral hygiene  Community Resource Referral / Chronic Care Management: CRR  required this visit?  No   CCM required this visit?  No   Plan:    I have personally reviewed and noted the following in the patient's chart:   Medical and social history Use of alcohol, tobacco or illicit drugs  Current medications and supplements including opioid prescriptions. Patient is not currently taking opioid prescriptions. Functional ability and status Nutritional status Physical activity Advanced directives List of other physicians Hospitalizations, surgeries, and ER visits in previous 12 months Vitals Screenings to include cognitive, depression, and falls Referrals and appointments  In addition, I have reviewed  and discussed with patient certain preventive protocols, quality metrics, and best practice recommendations. A written personalized care plan for preventive services as well as general preventive health recommendations were provided to patient.   Verdie CHRISTELLA Saba, CMA   11/19/2023   After Visit Summary: (MyChart) Due to this being a telephonic visit, the after visit summary with patients personalized plan was offered to patient via MyChart   Notes: Nothing significant to report at this time.

## 2023-12-07 ENCOUNTER — Ambulatory Visit

## 2023-12-18 ENCOUNTER — Ambulatory Visit

## 2023-12-18 DIAGNOSIS — Z7901 Long term (current) use of anticoagulants: Secondary | ICD-10-CM

## 2023-12-18 LAB — POCT INR: INR: 2.2 (ref 2.0–3.0)

## 2023-12-18 NOTE — Patient Instructions (Addendum)
 Pre visit review using our clinic review tool, if applicable. No additional management support is needed unless otherwise documented below in the visit note.  Continue 1 tablet daily except take 1/2 tablet on Tuesdays, Thursdays and Saturdays. Recheck in 9 weeks.

## 2023-12-18 NOTE — Progress Notes (Addendum)
 Continue 1 tablet daily except take 1/2 tablet on Tuesdays, Thursdays and Saturdays. Recheck in 9 weeks per pt request due to transportation. Pt also requested an apt with PCP for the same day and also for her husband.   Medical screening examination/treatment/procedure(s) were performed by non-physician practitioner and as supervising physician I was immediately available for consultation/collaboration.  I agree with above. Karlynn Noel, MD

## 2023-12-25 ENCOUNTER — Ambulatory Visit
Admission: RE | Admit: 2023-12-25 | Discharge: 2023-12-25 | Disposition: A | Discharge status: Home / Self Care | Source: Ambulatory Visit | Attending: Internal Medicine | Admitting: Internal Medicine

## 2023-12-25 DIAGNOSIS — Z1231 Encounter for screening mammogram for malignant neoplasm of breast: Secondary | ICD-10-CM

## 2023-12-26 ENCOUNTER — Ambulatory Visit: Admitting: Internal Medicine

## 2024-01-23 ENCOUNTER — Other Ambulatory Visit: Payer: Self-pay | Admitting: Internal Medicine

## 2024-01-23 DIAGNOSIS — Z7901 Long term (current) use of anticoagulants: Secondary | ICD-10-CM

## 2024-01-23 NOTE — Telephone Encounter (Signed)
 Pt is compliant with warfarin management and PCP apts.  Sent in refill of warfarin to requested pharmacy.

## 2024-02-05 ENCOUNTER — Telehealth: Payer: Self-pay

## 2024-02-05 ENCOUNTER — Ambulatory Visit

## 2024-02-05 ENCOUNTER — Other Ambulatory Visit (INDEPENDENT_AMBULATORY_CARE_PROVIDER_SITE_OTHER)

## 2024-02-05 DIAGNOSIS — R300 Dysuria: Secondary | ICD-10-CM | POA: Diagnosis not present

## 2024-02-05 LAB — URINALYSIS, ROUTINE W REFLEX MICROSCOPIC
Bilirubin Urine: NEGATIVE
Hgb urine dipstick: NEGATIVE
Ketones, ur: NEGATIVE
Leukocytes,Ua: NEGATIVE
Nitrite: POSITIVE — AB
RBC / HPF: NONE SEEN
Specific Gravity, Urine: 1.015 (ref 1.000–1.030)
Total Protein, Urine: NEGATIVE
Urine Glucose: NEGATIVE
Urobilinogen, UA: 0.2 (ref 0.0–1.0)
pH: 6.5 (ref 5.0–8.0)

## 2024-02-05 NOTE — Telephone Encounter (Signed)
 Spoke with the pt and was able to inform her that the order has been placed for her to come and drop off her urine sample to be tested.

## 2024-02-05 NOTE — Telephone Encounter (Signed)
 Copied from CRM [id number]. Topic: Clinical - Request for Lab/Test Order >> Feb 05, 2024  8:08 AM [name] Little wrote: Reason for CRM: Patient would like an order for urinalysis. She has burning, and pressure and frequency. Started 2 days ago. She would like this done today if possible please, she declined an appt.

## 2024-02-05 NOTE — Addendum Note (Signed)
 Addended by: HEDDY IP R on: 02/05/2024 10:34 AM   Modules accepted: Orders

## 2024-02-06 ENCOUNTER — Other Ambulatory Visit: Payer: Self-pay | Admitting: Internal Medicine

## 2024-02-06 ENCOUNTER — Ambulatory Visit: Payer: Self-pay

## 2024-02-06 MED ORDER — NITROFURANTOIN MONOHYD MACRO 100 MG PO CAPS: 100.0000 mg | ORAL_CAPSULE | Freq: Two times a day (BID) | ORAL | 0 refills | Status: DC

## 2024-02-06 NOTE — Telephone Encounter (Signed)
 Copied from CRM [id number]. Topic: Clinical - Red Word Triage >> Feb 06, 2024 12:43 PM Robinson H wrote: Red Word that prompted transfer to Nurse Triage: Patient was in yesterday for UTI and was supposed to have pain medication called in and states she is in a whole lot of pain no medications on file being sent recently. >> Feb 06, 2024  3:15 PM Thersia C wrote: Patient is calling back in, still is in a lot of pain and has still not received her medication

## 2024-02-06 NOTE — Telephone Encounter (Signed)
 Pt called in stating she has called mutliple times today and is very upset this hasn't been completed. RN did explain the process to the patient and also advised they may be waiting on the culture to come back before they pick a medication. Pt is very upset and states am I supposed to just suffer. Rn apologized and advised would send message to the office. She stated appreciation.

## 2024-02-06 NOTE — Telephone Encounter (Signed)
 FYI Only or Action Required?: Action required by provider: clinical question for provider and update on patient condition.  Patient was last seen in primary care on 11/13/2023 by Alvia Corean CROME, FNP.  Called Nurse Triage reporting Back Pain.  Symptoms began today.  Interventions attempted: Nothing.  Symptoms are: gradually worsening.  Triage Disposition: Call PCP Within 24 Hours  Patient/caregiver understands and will follow disposition?: Yes    Copied from CRM #[id number]. Topic: Clinical - Red Word Triage >> Feb 06, 2024 12:43 PM Robinson H wrote: Red Word that prompted transfer to Nurse Triage: Patient was in yesterday for UTI and was supposed to have pain medication called in and states she is in a whole lot of pain no medications on file being sent recently.    Reason for Disposition  Triager uncertain how to interpret urine test results  Answer Assessment - Initial Assessment Questions Pt calling in to report worsening back pain, and pain/burning with urination. Pt states she came into clinic and completed urinalysis but has not received results or rx at this time. Discussed test is still pending and discussed adding tylenol  for pain and using heat. Pt requested rx to [facility] and notification from clinic once it has been sent.     1. TEST: What kind of urine test was performed? (e.g., urinalysis, urine dipstick)      Urinalysis yesterday, 02/05/24  2. URINALYSIS RESULT: Was it positive or negative?  If positive, document what was positive (e.g., LE, nitrites, WBC, RBC, bacteria, epithelial cells)     Test is pending at this time; no results   3. FEVER: Do you have a fever? If Yes, ask: What is your temperature, how was it measured, and when did it start?     No   4. FLANK PAIN: Do you have any pain in your side?     Yes; lower back pain   5. OTHER SYMPTOMS: Do you have any other symptoms? (e.g., blood in urine, vomiting)     Burning and pain with  urination   6. PREGNANCY: Is there any chance you are pregnant? When was your last menstrual period?     N/a   7. PHENAZOPYRIDINE (Uristat, Pyridium): Have you taken phenazopyridine (turns your urine orange) recently?     Pt reports urine is orange d/t daily vitamins; pt denies blood  Protocols used: Urinalysis Results Follow-up Call-A-AH

## 2024-02-06 NOTE — Telephone Encounter (Signed)
 Okay Macrobid .  Thanks

## 2024-02-07 ENCOUNTER — Ambulatory Visit: Payer: Self-pay | Admitting: Internal Medicine

## 2024-02-07 NOTE — Telephone Encounter (Signed)
 Tried to reach the pt to inform her of PCP response as follows Okay Macrobid .  Thanks   PCP has sent in the above medication to the pts pharmacy.

## 2024-02-08 LAB — URINE CULTURE

## 2024-02-19 ENCOUNTER — Ambulatory Visit

## 2024-02-19 ENCOUNTER — Encounter: Payer: Self-pay | Admitting: Internal Medicine

## 2024-02-19 ENCOUNTER — Ambulatory Visit: Admitting: Internal Medicine

## 2024-02-19 VITALS — BP 174/100 | HR 106 | Ht 63.0 in

## 2024-02-19 DIAGNOSIS — N390 Urinary tract infection, site not specified: Secondary | ICD-10-CM

## 2024-02-19 DIAGNOSIS — I10 Essential (primary) hypertension: Secondary | ICD-10-CM | POA: Diagnosis not present

## 2024-02-19 DIAGNOSIS — E669 Obesity, unspecified: Secondary | ICD-10-CM | POA: Diagnosis not present

## 2024-02-19 DIAGNOSIS — Z7901 Long term (current) use of anticoagulants: Secondary | ICD-10-CM | POA: Diagnosis not present

## 2024-02-19 DIAGNOSIS — I7121 Aneurysm of the ascending aorta, without rupture: Secondary | ICD-10-CM

## 2024-02-19 DIAGNOSIS — E782 Mixed hyperlipidemia: Secondary | ICD-10-CM

## 2024-02-19 DIAGNOSIS — Z789 Other specified health status: Secondary | ICD-10-CM

## 2024-02-19 DIAGNOSIS — E119 Type 2 diabetes mellitus without complications: Secondary | ICD-10-CM | POA: Diagnosis not present

## 2024-02-19 DIAGNOSIS — E118 Type 2 diabetes mellitus with unspecified complications: Secondary | ICD-10-CM

## 2024-02-19 LAB — COMPREHENSIVE METABOLIC PANEL WITH GFR
ALT: 14 U/L (ref 3–35)
AST: 21 U/L (ref 5–37)
Albumin: 4.3 g/dL (ref 3.5–5.2)
Alkaline Phosphatase: 69 U/L (ref 39–117)
BUN: 21 mg/dL (ref 6–23)
CO2: 31 meq/L (ref 19–32)
Calcium: 9.4 mg/dL (ref 8.4–10.5)
Chloride: 103 meq/L (ref 96–112)
Creatinine, Ser: 0.81 mg/dL (ref 0.40–1.20)
GFR: 64.92 mL/min
Glucose, Bld: 104 mg/dL — ABNORMAL HIGH (ref 70–99)
Potassium: 4 meq/L (ref 3.5–5.1)
Sodium: 140 meq/L (ref 135–145)
Total Bilirubin: 0.6 mg/dL (ref 0.2–1.2)
Total Protein: 8 g/dL (ref 6.0–8.3)

## 2024-02-19 LAB — POCT INR: INR: 2.7 (ref 2.0–3.0)

## 2024-02-19 LAB — HEMOGLOBIN A1C: Hgb A1c MFr Bld: 6.9 % — ABNORMAL HIGH (ref 4.6–6.5)

## 2024-02-19 MED ORDER — WARFARIN SODIUM 5 MG PO TABS: ORAL_TABLET | ORAL | 1 refills | Status: AC

## 2024-02-19 MED ORDER — CLONIDINE HCL 0.1 MG PO TABS: 0.1000 mg | ORAL_TABLET | Freq: Three times a day (TID) | ORAL | 5 refills | Status: AC | PRN

## 2024-02-19 MED ORDER — NITROFURANTOIN MONOHYD MACRO 100 MG PO CAPS: 100.0000 mg | ORAL_CAPSULE | Freq: Two times a day (BID) | ORAL | 0 refills | Status: AC

## 2024-02-19 NOTE — Progress Notes (Cosign Needed Addendum)
 Pt started Macrobid  on 1/8 for UTI. No interaction with warfarin. Pt reports UTI has cleared. Pt also has PCP apt today. Continue 1 tablet daily except take 1/2 tablet on Tuesdays, Thursdays and Saturdays. Recheck in 6 weeks. Pt is compliant with warfarin management and PCP apts.  Sent in refill of warfarin to requested pharmacy.    Medical screening examination/treatment/procedure(s) were performed by non-physician practitioner and as supervising physician I was immediately available for consultation/collaboration.  I agree with above. Karlynn Noel, MD

## 2024-02-19 NOTE — Progress Notes (Signed)
 "  Subjective:  Patient ID: [name] Little, female    DOB: May 01, 1933  Age: 89 y.o. MRN: [id number]  CC: Medical Management of Chronic Issues (3 Month follow up)   HPI [name] Little presents for stress w/husband, insomnia  Outpatient Medications Prior to Visit  Medication Sig Dispense Refill   albuterol  (VENTOLIN  HFA) 108 (90 Base) MCG/ACT inhaler Inhale 2 puffs into the lungs every 6 (six) hours as needed. 18 g 0   B Complex Vitamins (VITAMIN B COMPLEX PO) Take 1 tablet by mouth daily.     Cranberry 1000 MG CAPS Take 1 capsule by mouth 2 (two) times daily.     furosemide  (LASIX ) 20 MG tablet Take 1 tablet (20 mg total) by mouth daily. 90 tablet 3   Nebivolol  HCl 20 MG TABS Take by mouth.     valsartan  (DIOVAN ) 320 MG tablet Take 1 tablet (320 mg total) by mouth daily. Pt needs office visit for further refills. 90 tablet 3   warfarin (COUMADIN ) 5 MG tablet TAKE 1 TABLET BY MOUTH DAILY EXCEPT TAKE 1/2 TABLET ON TUESDAY, THURSDAY, AND SATURDAY OR AS DIRECTED BY ANTICOAGULATION CLINIC 100 tablet 1   nitrofurantoin , macrocrystal-monohydrate, (MACROBID ) 100 MG capsule Take 1 capsule (100 mg total) by mouth 2 (two) times daily. 10 capsule 0   metroNIDAZOLE  (METROCREAM ) 0.75 % cream Apply 1 Application topically daily as needed (Irritation). (Patient not taking: Reported on 02/19/2024)     predniSONE  (DELTASONE ) 20 MG tablet Take 1 tablet (20 mg total) by mouth daily with breakfast. (Patient not taking: Reported on 02/19/2024) 5 tablet 0   warfarin (COUMADIN ) 5 MG tablet TAKE 1 TABLET BY MOUTH DAILY EXCEPT TAKE 1/2 TABLET ON TUESDAYS, THURSDAY, SATURDAYS OR AS DIRECTED BY ANTICOAGULATION CLINIC 100 tablet 1   No facility-administered medications prior to visit.    ROS: Review of Systems  Constitutional:  Negative for activity change, appetite change, chills, fatigue and unexpected weight change.  HENT:  Negative for congestion, mouth sores and sinus pressure.   Eyes:  Negative for  visual disturbance.  Respiratory:  Negative for cough and chest tightness.   Gastrointestinal:  Negative for abdominal pain and nausea.  Genitourinary:  Negative for difficulty urinating, frequency and vaginal pain.  Musculoskeletal:  Negative for back pain and gait problem.  Skin:  Negative for pallor and rash.  Neurological:  Negative for dizziness, tremors, weakness, numbness and headaches.  Psychiatric/Behavioral:  Positive for sleep disturbance. Negative for confusion and suicidal ideas. The patient is nervous/anxious.     Objective:  BP (!) 174/100   Pulse (!) 106   Ht 5' 3 (1.6 m)   SpO2 96%   BMI 30.11 kg/m   BP Readings from Last 3 Encounters:  02/19/24 (!) 174/100  11/13/23 (!) 170/98  09/25/23 126/80    Wt Readings from Last 3 Encounters:  11/19/23 170 lb (77.1 kg)  11/13/23 170 lb 6.4 oz (77.3 kg)  09/25/23 171 lb (77.6 kg)    Physical Exam Constitutional:      General: She is not in acute distress.    Appearance: She is well-developed. She is obese.  HENT:     Head: Normocephalic.     Right Ear: External ear normal.     Left Ear: External ear normal.     Nose: Nose normal.  Eyes:     General:        Right eye: No discharge.        Left eye: No discharge.  Conjunctiva/sclera: Conjunctivae normal.     Pupils: Pupils are equal, round, and reactive to light.  Neck:     Thyroid : No thyromegaly.     Vascular: No JVD.     Trachea: No tracheal deviation.  Cardiovascular:     Rate and Rhythm: Normal rate and regular rhythm.     Heart sounds: Normal heart sounds.  Pulmonary:     Effort: No respiratory distress.     Breath sounds: No stridor. No wheezing.  Abdominal:     General: Bowel sounds are normal. There is no distension.     Palpations: Abdomen is soft. There is no mass.     Tenderness: There is no abdominal tenderness. There is no guarding or rebound.  Musculoskeletal:        General: No tenderness.     Cervical back: Normal range of motion  and neck supple. No rigidity.  Lymphadenopathy:     Cervical: No cervical adenopathy.  Skin:    Findings: No erythema or rash.  Neurological:     Cranial Nerves: No cranial nerve deficit.     Motor: No abnormal muscle tone.     Coordination: Coordination normal.     Deep Tendon Reflexes: Reflexes normal.  Psychiatric:        Behavior: Behavior normal.        Thought Content: Thought content normal.        Judgment: Judgment normal.     Lab Results  Component Value Date   WBC 11.5 (H) 05/08/2023   HGB 13.9 05/08/2023   HCT 43.0 05/08/2023   PLT 303 05/08/2023   GLUCOSE 103 (H) 08/14/2023   CHOL 154 04/24/2022   TRIG 167.0 (H) 04/24/2022   HDL 34.40 (L) 04/24/2022   LDLDIRECT 144.0 02/09/2022   LDLCALC 86 04/24/2022   ALT 11 08/14/2023   AST 18 08/14/2023   NA 141 08/14/2023   K 4.0 08/14/2023   CL 105 08/14/2023   CREATININE 0.74 08/14/2023   BUN 20 08/14/2023   CO2 30 08/14/2023   TSH 3.93 08/14/2023   INR 2.7 02/19/2024   HGBA1C 7.0 (H) 08/14/2023    MM 3D SCREENING MAMMOGRAM BILATERAL BREAST Result Date: 12/31/2023 CLINICAL DATA:  Screening. EXAM: DIGITAL SCREENING BILATERAL MAMMOGRAM WITH TOMOSYNTHESIS AND CAD TECHNIQUE: Bilateral screening digital craniocaudal and mediolateral oblique mammograms were obtained. Bilateral screening digital breast tomosynthesis was performed. The images were evaluated with computer-aided detection. COMPARISON:  Previous exam(s). ACR Breast Density Category b: There are scattered areas of fibroglandular density. FINDINGS: There are no findings suspicious for malignancy. IMPRESSION: No mammographic evidence of malignancy. A result letter of this screening mammogram will be mailed directly to the patient. RECOMMENDATION: Screening mammogram in one year. (Code:SM-B-01Y) BI-RADS CATEGORY  1: Negative. Electronically Signed   By: Alm Parkins M.D.   On: 12/31/2023 09:06    Assessment & Plan:   Problem List Items Addressed This Visit      Hyperlipidemia - Primary   Did not tolerate Lipitor or Pravachol with pain and weakness.      Relevant Medications   cloNIDine  (CATAPRES ) 0.1 MG tablet   Essential hypertension   High BP Cont Diovan  every day, Lasix  every day Added Clonidine  for better BP control      Relevant Medications   cloNIDine  (CATAPRES ) 0.1 MG tablet   Aneurysm of thoracic aorta (HCC)   Added Clonidine  for better BP control      Relevant Medications   cloNIDine  (CATAPRES ) 0.1 MG tablet   UTI (  urinary tract infection)   Macrobid  if UTI relapsed - Rx given      Relevant Medications   nitrofurantoin , macrocrystal-monohydrate, (MACROBID ) 100 MG capsule      Meds ordered this encounter  Medications   nitrofurantoin , macrocrystal-monohydrate, (MACROBID ) 100 MG capsule    Sig: Take 1 capsule (100 mg total) by mouth 2 (two) times daily.    Dispense:  10 capsule    Refill:  0   cloNIDine  (CATAPRES ) 0.1 MG tablet    Sig: Take 1 tablet (0.1 mg total) by mouth 3 (three) times daily as needed (if SBP>170).    Dispense:  90 tablet    Refill:  5      Follow-up: No follow-ups on file.  Marolyn Noel, MD "

## 2024-02-19 NOTE — Assessment & Plan Note (Addendum)
 High BP Cont Diovan  every day, Lasix  every day Added Clonidine  for better BP control

## 2024-02-19 NOTE — Patient Instructions (Signed)

## 2024-02-19 NOTE — Patient Instructions (Addendum)
Pre visit review using our clinic review tool, if applicable. No additional management support is needed unless otherwise documented below in the visit note.  Continue 1 tablet daily except take 1/2 tablet on Tuesdays, Thursdays and Saturdays. Recheck in 6 weeks. 

## 2024-02-19 NOTE — Assessment & Plan Note (Signed)
On diet Check A1c 

## 2024-02-19 NOTE — Assessment & Plan Note (Signed)
 Did not tolerate Lipitor or Pravachol with pain and weakness.

## 2024-02-19 NOTE — Assessment & Plan Note (Signed)
 Macrobid  if UTI relapsed - Rx given

## 2024-02-19 NOTE — Assessment & Plan Note (Signed)
 Can't take any statins

## 2024-02-19 NOTE — Assessment & Plan Note (Signed)
 Added Clonidine  for better BP control

## 2024-02-21 ENCOUNTER — Ambulatory Visit: Payer: Self-pay | Admitting: Internal Medicine

## 2024-04-01 ENCOUNTER — Ambulatory Visit

## 2024-12-02 ENCOUNTER — Encounter: Admitting: Internal Medicine

## 2024-12-02 ENCOUNTER — Ambulatory Visit
